# Patient Record
Sex: Male | Born: 1960 | Race: Asian | Hispanic: No | Marital: Married | Smoking: Former smoker
Health system: Southern US, Community
[De-identification: ages and names within clinical notes are randomized; demographics above are authoritative.]

## PROBLEM LIST (undated history)

## (undated) DIAGNOSIS — I219 Acute myocardial infarction, unspecified: Secondary | ICD-10-CM

## (undated) DIAGNOSIS — IMO0002 Reserved for concepts with insufficient information to code with codable children: Secondary | ICD-10-CM

## (undated) DIAGNOSIS — E785 Hyperlipidemia, unspecified: Secondary | ICD-10-CM

## (undated) DIAGNOSIS — E790 Hyperuricemia without signs of inflammatory arthritis and tophaceous disease: Secondary | ICD-10-CM

## (undated) DIAGNOSIS — K922 Gastrointestinal hemorrhage, unspecified: Secondary | ICD-10-CM

## (undated) DIAGNOSIS — I480 Paroxysmal atrial fibrillation: Secondary | ICD-10-CM

## (undated) DIAGNOSIS — D649 Anemia, unspecified: Secondary | ICD-10-CM

## (undated) DIAGNOSIS — M1990 Unspecified osteoarthritis, unspecified site: Secondary | ICD-10-CM

## (undated) DIAGNOSIS — N189 Chronic kidney disease, unspecified: Secondary | ICD-10-CM

## (undated) DIAGNOSIS — I10 Essential (primary) hypertension: Secondary | ICD-10-CM

## (undated) DIAGNOSIS — I639 Cerebral infarction, unspecified: Secondary | ICD-10-CM

## (undated) DIAGNOSIS — R011 Cardiac murmur, unspecified: Secondary | ICD-10-CM

## (undated) DIAGNOSIS — E119 Type 2 diabetes mellitus without complications: Secondary | ICD-10-CM

## (undated) DIAGNOSIS — K219 Gastro-esophageal reflux disease without esophagitis: Secondary | ICD-10-CM

## (undated) DIAGNOSIS — Z972 Presence of dental prosthetic device (complete) (partial): Secondary | ICD-10-CM

## (undated) DIAGNOSIS — I509 Heart failure, unspecified: Secondary | ICD-10-CM

## (undated) DIAGNOSIS — D689 Coagulation defect, unspecified: Secondary | ICD-10-CM

## (undated) DIAGNOSIS — F419 Anxiety disorder, unspecified: Secondary | ICD-10-CM

## (undated) DIAGNOSIS — G709 Myoneural disorder, unspecified: Secondary | ICD-10-CM

## (undated) HISTORY — DX: Gastro-esophageal reflux disease without esophagitis: K21.9

## (undated) HISTORY — DX: Chronic kidney disease, unspecified: N18.9

## (undated) HISTORY — DX: Heart failure, unspecified: I50.9

## (undated) HISTORY — DX: Reserved for concepts with insufficient information to code with codable children: IMO0002

## (undated) HISTORY — DX: Acute myocardial infarction, unspecified: I21.9

## (undated) HISTORY — DX: Cardiac murmur, unspecified: R01.1

## (undated) HISTORY — DX: Cerebral infarction, unspecified: I63.9

## (undated) HISTORY — DX: Myoneural disorder, unspecified: G70.9

## (undated) HISTORY — DX: Anxiety disorder, unspecified: F41.9

## (undated) HISTORY — DX: Coagulation defect, unspecified: D68.9

---

## 2019-12-21 LAB — LIPID PANEL
Cholesterol: 172 (ref 0–200)
HDL: 30 — AB (ref 35–70)
LDL Cholesterol: 73
Triglycerides: 433 — AB (ref 40–160)

## 2019-12-21 LAB — MICROALBUMIN, URINE: Microalb, Ur: 387

## 2019-12-21 LAB — PSA: PSA: 0.2

## 2020-02-20 DIAGNOSIS — I25810 Atherosclerosis of coronary artery bypass graft(s) without angina pectoris: Secondary | ICD-10-CM

## 2020-02-20 HISTORY — DX: Atherosclerosis of coronary artery bypass graft(s) without angina pectoris: I25.810

## 2020-02-22 ENCOUNTER — Encounter (HOSPITAL_BASED_OUTPATIENT_CLINIC_OR_DEPARTMENT_OTHER): Payer: Self-pay | Admitting: Emergency Medicine

## 2020-02-22 ENCOUNTER — Inpatient Hospital Stay (HOSPITAL_BASED_OUTPATIENT_CLINIC_OR_DEPARTMENT_OTHER)
Admission: EM | Admit: 2020-02-22 | Discharge: 2020-03-13 | DRG: 981 | Disposition: A | Discharge status: Home / Self Care | Payer: Managed Care, Other (non HMO) | Attending: Cardiothoracic Surgery | Admitting: Cardiothoracic Surgery

## 2020-02-22 ENCOUNTER — Other Ambulatory Visit: Payer: Self-pay

## 2020-02-22 ENCOUNTER — Inpatient Hospital Stay (HOSPITAL_COMMUNITY): Payer: Managed Care, Other (non HMO)

## 2020-02-22 DIAGNOSIS — I152 Hypertension secondary to endocrine disorders: Secondary | ICD-10-CM | POA: Diagnosis present

## 2020-02-22 DIAGNOSIS — I083 Combined rheumatic disorders of mitral, aortic and tricuspid valves: Secondary | ICD-10-CM | POA: Diagnosis present

## 2020-02-22 DIAGNOSIS — J810 Acute pulmonary edema: Secondary | ICD-10-CM | POA: Diagnosis not present

## 2020-02-22 DIAGNOSIS — Z6827 Body mass index (BMI) 27.0-27.9, adult: Secondary | ICD-10-CM

## 2020-02-22 DIAGNOSIS — E119 Type 2 diabetes mellitus without complications: Secondary | ICD-10-CM | POA: Diagnosis present

## 2020-02-22 DIAGNOSIS — I2510 Atherosclerotic heart disease of native coronary artery without angina pectoris: Secondary | ICD-10-CM | POA: Diagnosis present

## 2020-02-22 DIAGNOSIS — D696 Thrombocytopenia, unspecified: Secondary | ICD-10-CM | POA: Diagnosis present

## 2020-02-22 DIAGNOSIS — Z4659 Encounter for fitting and adjustment of other gastrointestinal appliance and device: Secondary | ICD-10-CM | POA: Diagnosis not present

## 2020-02-22 DIAGNOSIS — K859 Acute pancreatitis without necrosis or infection, unspecified: Secondary | ICD-10-CM | POA: Diagnosis present

## 2020-02-22 DIAGNOSIS — Z01810 Encounter for preprocedural cardiovascular examination: Secondary | ICD-10-CM | POA: Diagnosis not present

## 2020-02-22 DIAGNOSIS — Z20822 Contact with and (suspected) exposure to covid-19: Secondary | ICD-10-CM | POA: Diagnosis present

## 2020-02-22 DIAGNOSIS — R502 Drug induced fever: Secondary | ICD-10-CM | POA: Diagnosis not present

## 2020-02-22 DIAGNOSIS — I340 Nonrheumatic mitral (valve) insufficiency: Secondary | ICD-10-CM | POA: Diagnosis not present

## 2020-02-22 DIAGNOSIS — I469 Cardiac arrest, cause unspecified: Secondary | ICD-10-CM | POA: Diagnosis not present

## 2020-02-22 DIAGNOSIS — Z9889 Other specified postprocedural states: Secondary | ICD-10-CM

## 2020-02-22 DIAGNOSIS — E669 Obesity, unspecified: Secondary | ICD-10-CM | POA: Diagnosis present

## 2020-02-22 DIAGNOSIS — E1110 Type 2 diabetes mellitus with ketoacidosis without coma: Secondary | ICD-10-CM | POA: Diagnosis not present

## 2020-02-22 DIAGNOSIS — I442 Atrioventricular block, complete: Secondary | ICD-10-CM | POA: Diagnosis not present

## 2020-02-22 DIAGNOSIS — G928 Other toxic encephalopathy: Secondary | ICD-10-CM | POA: Diagnosis not present

## 2020-02-22 DIAGNOSIS — I480 Paroxysmal atrial fibrillation: Secondary | ICD-10-CM | POA: Diagnosis not present

## 2020-02-22 DIAGNOSIS — I462 Cardiac arrest due to underlying cardiac condition: Secondary | ICD-10-CM | POA: Diagnosis not present

## 2020-02-22 DIAGNOSIS — K7200 Acute and subacute hepatic failure without coma: Secondary | ICD-10-CM | POA: Diagnosis present

## 2020-02-22 DIAGNOSIS — R509 Fever, unspecified: Secondary | ICD-10-CM | POA: Diagnosis not present

## 2020-02-22 DIAGNOSIS — K922 Gastrointestinal hemorrhage, unspecified: Principal | ICD-10-CM | POA: Diagnosis present

## 2020-02-22 DIAGNOSIS — Z7982 Long term (current) use of aspirin: Secondary | ICD-10-CM

## 2020-02-22 DIAGNOSIS — Z951 Presence of aortocoronary bypass graft: Secondary | ICD-10-CM | POA: Diagnosis not present

## 2020-02-22 DIAGNOSIS — D62 Acute posthemorrhagic anemia: Secondary | ICD-10-CM | POA: Diagnosis present

## 2020-02-22 DIAGNOSIS — J9601 Acute respiratory failure with hypoxia: Secondary | ICD-10-CM | POA: Diagnosis not present

## 2020-02-22 DIAGNOSIS — K648 Other hemorrhoids: Secondary | ICD-10-CM | POA: Diagnosis present

## 2020-02-22 DIAGNOSIS — M109 Gout, unspecified: Secondary | ICD-10-CM | POA: Diagnosis present

## 2020-02-22 DIAGNOSIS — I214 Non-ST elevation (NSTEMI) myocardial infarction: Secondary | ICD-10-CM | POA: Diagnosis not present

## 2020-02-22 DIAGNOSIS — I493 Ventricular premature depolarization: Secondary | ICD-10-CM | POA: Diagnosis not present

## 2020-02-22 DIAGNOSIS — E785 Hyperlipidemia, unspecified: Secondary | ICD-10-CM | POA: Diagnosis not present

## 2020-02-22 DIAGNOSIS — E876 Hypokalemia: Secondary | ICD-10-CM | POA: Diagnosis not present

## 2020-02-22 DIAGNOSIS — N179 Acute kidney failure, unspecified: Secondary | ICD-10-CM | POA: Diagnosis present

## 2020-02-22 DIAGNOSIS — Z452 Encounter for adjustment and management of vascular access device: Secondary | ICD-10-CM

## 2020-02-22 DIAGNOSIS — I10 Essential (primary) hypertension: Secondary | ICD-10-CM | POA: Diagnosis not present

## 2020-02-22 DIAGNOSIS — R579 Shock, unspecified: Secondary | ICD-10-CM | POA: Diagnosis not present

## 2020-02-22 DIAGNOSIS — R569 Unspecified convulsions: Secondary | ICD-10-CM | POA: Diagnosis not present

## 2020-02-22 DIAGNOSIS — T39395A Adverse effect of other nonsteroidal anti-inflammatory drugs [NSAID], initial encounter: Secondary | ICD-10-CM | POA: Diagnosis present

## 2020-02-22 DIAGNOSIS — R570 Cardiogenic shock: Secondary | ICD-10-CM | POA: Diagnosis present

## 2020-02-22 DIAGNOSIS — K5730 Diverticulosis of large intestine without perforation or abscess without bleeding: Secondary | ICD-10-CM | POA: Diagnosis present

## 2020-02-22 DIAGNOSIS — R1110 Vomiting, unspecified: Secondary | ICD-10-CM

## 2020-02-22 DIAGNOSIS — I25110 Atherosclerotic heart disease of native coronary artery with unstable angina pectoris: Secondary | ICD-10-CM | POA: Diagnosis not present

## 2020-02-22 DIAGNOSIS — Z8679 Personal history of other diseases of the circulatory system: Secondary | ICD-10-CM | POA: Diagnosis not present

## 2020-02-22 DIAGNOSIS — I472 Ventricular tachycardia: Secondary | ICD-10-CM | POA: Diagnosis not present

## 2020-02-22 DIAGNOSIS — Z87891 Personal history of nicotine dependence: Secondary | ICD-10-CM

## 2020-02-22 DIAGNOSIS — E8770 Fluid overload, unspecified: Secondary | ICD-10-CM | POA: Diagnosis not present

## 2020-02-22 DIAGNOSIS — J9690 Respiratory failure, unspecified, unspecified whether with hypoxia or hypercapnia: Secondary | ICD-10-CM

## 2020-02-22 DIAGNOSIS — I252 Old myocardial infarction: Secondary | ICD-10-CM

## 2020-02-22 DIAGNOSIS — I361 Nonrheumatic tricuspid (valve) insufficiency: Secondary | ICD-10-CM | POA: Diagnosis not present

## 2020-02-22 HISTORY — DX: Essential (primary) hypertension: I10

## 2020-02-22 HISTORY — DX: Type 2 diabetes mellitus without complications: E11.9

## 2020-02-22 LAB — COMPREHENSIVE METABOLIC PANEL
ALT: 43 U/L (ref 0–44)
ALT: <5 U/L (ref 0–44)
AST: 29 U/L (ref 15–41)
AST: 29 U/L (ref 15–41)
Albumin: 4.1 g/dL (ref 3.5–5.0)
Albumin: 3.1 g/dL — ABNORMAL LOW (ref 3.5–5.0)
Alkaline Phosphatase: 69 U/L (ref 38–126)
Alkaline Phosphatase: 44 U/L (ref 38–126)
Anion gap: 15 (ref 5–15)
Anion gap: 16 — ABNORMAL HIGH (ref 5–15)
BUN: 18 mg/dL (ref 6–20)
BUN: 18 mg/dL (ref 6–20)
CO2: 21 mmol/L — ABNORMAL LOW (ref 22–32)
CO2: 15 mmol/L — ABNORMAL LOW (ref 22–32)
Calcium: 9 mg/dL (ref 8.9–10.3)
Calcium: 7.9 mg/dL — ABNORMAL LOW (ref 8.9–10.3)
Chloride: 98 mmol/L (ref 98–111)
Chloride: 104 mmol/L (ref 98–111)
Creatinine, Ser: 0.95 mg/dL (ref 0.61–1.24)
Creatinine, Ser: 1.1 mg/dL (ref 0.61–1.24)
GFR, Estimated: >60 mL/min (ref >60)
GFR, Estimated: >60 mL/min (ref >60)
Glucose, Bld: 417 mg/dL — ABNORMAL HIGH (ref 70–99)
Glucose, Bld: 457 mg/dL — ABNORMAL HIGH (ref 70–99)
Potassium: 4.2 mmol/L (ref 3.5–5.1)
Potassium: 4.7 mmol/L (ref 3.5–5.1)
Sodium: 134 mmol/L — ABNORMAL LOW (ref 135–145)
Sodium: 135 mmol/L (ref 135–145)
Total Bilirubin: 1.1 mg/dL (ref 0.3–1.2)
Total Bilirubin: 0.9 mg/dL (ref 0.3–1.2)
Total Protein: 8.3 g/dL — ABNORMAL HIGH (ref 6.5–8.1)
Total Protein: 6.1 g/dL — ABNORMAL LOW (ref 6.5–8.1)

## 2020-02-22 LAB — ABO/RH: ABO/RH(D): O POS

## 2020-02-22 LAB — CBC
HCT: 37.2 % — ABNORMAL LOW (ref 39.0–52.0)
HCT: 32.8 % — ABNORMAL LOW (ref 39.0–52.0)
Hemoglobin: 13.2 g/dL (ref 13.0–17.0)
Hemoglobin: 11.6 g/dL — ABNORMAL LOW (ref 13.0–17.0)
MCH: 32.4 pg (ref 26.0–34.0)
MCH: 32.4 pg (ref 26.0–34.0)
MCHC: 35.5 g/dL (ref 30.0–36.0)
MCHC: 35.4 g/dL (ref 30.0–36.0)
MCV: 91.2 fL (ref 80.0–100.0)
MCV: 91.6 fL (ref 80.0–100.0)
Platelets: 212 10*3/uL (ref 150–400)
Platelets: 203 10*3/uL (ref 150–400)
RBC: 4.08 MIL/uL — ABNORMAL LOW (ref 4.22–5.81)
RBC: 3.58 MIL/uL — ABNORMAL LOW (ref 4.22–5.81)
RDW: 12.7 % (ref 11.5–15.5)
RDW: 12.7 % (ref 11.5–15.5)
WBC: 15.1 10*3/uL — ABNORMAL HIGH (ref 4.0–10.5)
WBC: 20.4 10*3/uL — ABNORMAL HIGH (ref 4.0–10.5)
nRBC: 0 % (ref 0.0–0.2)
nRBC: 0 % (ref 0.0–0.2)

## 2020-02-22 LAB — GLUCOSE, CAPILLARY: Glucose-Capillary: 425 mg/dL — ABNORMAL HIGH (ref 70–99)

## 2020-02-22 LAB — OCCULT BLOOD X 1 CARD TO LAB, STOOL: Fecal Occult Bld: POSITIVE — AB

## 2020-02-22 LAB — HEMOGLOBIN AND HEMATOCRIT, BLOOD
HCT: 26.6 % — ABNORMAL LOW (ref 39.0–52.0)
Hemoglobin: 8.9 g/dL — ABNORMAL LOW (ref 13.0–17.0)

## 2020-02-22 LAB — RESP PANEL BY RT-PCR (FLU A&B, COVID) ARPGX2
Influenza A by PCR: NEGATIVE
Influenza B by PCR: NEGATIVE
SARS Coronavirus 2 by RT PCR: NEGATIVE

## 2020-02-22 LAB — PROTIME-INR
INR: 1.3 — ABNORMAL HIGH (ref 0.8–1.2)
Prothrombin Time: 16 seconds — ABNORMAL HIGH (ref 11.4–15.2)

## 2020-02-22 LAB — PREPARE RBC (CROSSMATCH)

## 2020-02-22 MED ORDER — INSULIN ASPART 100 UNIT/ML ~~LOC~~ SOLN
0.0000 [IU] | SUBCUTANEOUS | Status: DC
  Administered 2020-02-22: 9 [IU] via SUBCUTANEOUS

## 2020-02-22 MED ORDER — LACTATED RINGERS IV BOLUS
1000.0000 mL | Freq: Once | INTRAVENOUS | Status: AC
  Administered 2020-02-22: 1000 mL via INTRAVENOUS

## 2020-02-22 MED ORDER — ONDANSETRON HCL 4 MG PO TABS: 4.0000 mg | ORAL_TABLET | Freq: Four times a day (QID) | ORAL | Status: DC | PRN

## 2020-02-22 MED ORDER — SODIUM CHLORIDE 0.9 % IV BOLUS
1000.0000 mL | Freq: Once | INTRAVENOUS | Status: AC
  Administered 2020-02-22: 1000 mL via INTRAVENOUS

## 2020-02-22 MED ORDER — INSULIN ASPART 100 UNIT/ML ~~LOC~~ SOLN
10.0000 [IU] | Freq: Once | SUBCUTANEOUS | Status: AC
  Administered 2020-02-22: 10 [IU] via SUBCUTANEOUS

## 2020-02-22 MED ORDER — IOHEXOL 350 MG/ML SOLN
100.0000 mL | Freq: Once | INTRAVENOUS | Status: AC | PRN
  Administered 2020-02-22: 100 mL via INTRAVENOUS

## 2020-02-22 MED ORDER — ACETAMINOPHEN 650 MG RE SUPP: 650.0000 mg | Freq: Four times a day (QID) | RECTAL | Status: DC | PRN

## 2020-02-22 MED ORDER — SODIUM CHLORIDE 0.45 % IV BOLUS: 1000.0000 mL | Freq: Once | INTRAVENOUS | Status: DC

## 2020-02-22 MED ORDER — SODIUM CHLORIDE 0.9% IV SOLUTION: Freq: Once | INTRAVENOUS | Status: AC

## 2020-02-22 MED ORDER — ACETAMINOPHEN 325 MG PO TABS
650.0000 mg | ORAL_TABLET | Freq: Four times a day (QID) | ORAL | Status: DC | PRN
  Filled 2020-02-22: qty 2

## 2020-02-22 MED ORDER — LACTATED RINGERS IV SOLN: INTRAVENOUS | Status: DC

## 2020-02-22 MED ORDER — ONDANSETRON HCL 4 MG/2ML IJ SOLN: 4.0000 mg | Freq: Four times a day (QID) | INTRAMUSCULAR | Status: DC | PRN

## 2020-02-22 NOTE — ED Provider Notes (Signed)
[facility] EMER[facility]ENCY DEPARTMENT Provider Note  CSN: [id number] Arrival date & time: 02/22/20 1105    History Chief Complaint  Patient presents with  . Rectal Bleeding    HPI  Colton Guzman is a 60 y.o. male with history of HTN, DM does not take any blood thinners was in his normal state of health the last few days but woke up from sleep around 3am to have a BM and noted it was all blood, described as cherry in color, no maroon or melanic stools. Not associated with N/V or abdominal pain. He has had 7 total bloody bowel movements since then. He has been feeling weak/tired but denies SOB or lightheadedness. He has no history of same.    Past Medical History:  Diagnosis Date  . Diabetes mellitus without complication ([place])   . Hypertension       No family history on file.  Social History   Tobacco Use  . Smoking status: Never Smoker  . Smokeless tobacco: Never Used  Substance Use Topics  . Alcohol use: Yes     Home Medications Prior to Admission medications   Not on File     Allergies    Patient has no known allergies.   Review of Systems   Review of Systems A comprehensive review of systems was completed and negative except as noted in HPI.    Physical Exam BP (!) 131/92 (BP Location: Right Arm)   Pulse (!) 114   Temp 99.2 F (37.3 C) (Oral)   Resp 18   Wt 88.2 kg   SpO2 97%   Physical Exam Vitals and nursing note reviewed.  Constitutional:      Appearance: Normal appearance.  HENT:     Head: Normocephalic and atraumatic.     Nose: Nose normal.     Mouth/Throat:     Mouth: Mucous membranes are moist.  Eyes:     Extraocular Movements: Extraocular movements intact.     Conjunctiva/sclera: Conjunctivae normal.  Cardiovascular:     Rate and Rhythm: Tachycardia present.  Pulmonary:     Effort: Pulmonary effort is normal.     Breath sounds: Normal breath sounds.  Abdominal:     General: Abdomen is flat.     Palpations: Abdomen is  soft.     Tenderness: There is no abdominal tenderness.  Genitourinary:    Comments: Chaperone present. Several small hemorrhoids, not bleeding. No tenderness or mass on rectal exam. Bright red blood on fingertip. No stool in rectum.  Musculoskeletal:        General: No swelling. Normal range of motion.     Cervical back: Neck supple.  Skin:    General: Skin is warm and dry.  Neurological:     General: No focal deficit present.     Mental Status: He is alert.  Psychiatric:        Mood and Affect: Mood normal.      ED Results / Procedures / Treatments   Labs (all labs ordered are listed, but only abnormal results are displayed) Labs Reviewed  COMPREHENSIVE METABOLIC PANEL  CBC  POC OCCULT BLOOD, ED    EKG EKG Interpretation  Date/Time:  Monday February 22 2020 11:49:08 EST Ventricular Rate:  114 PR Interval:    QRS Duration: 79 QT Interval:  318 QTC Calculation: 438 R Axis:   14 Text Interpretation: Sinus tachycardia Probable left atrial enlargement Low voltage, precordial leads Anteroseptal infarct, old No old tracing to compare Confirmed by Karle Starch,  Juanda Crumble [phone]) on 02/22/2020 11:50:33 AM    Radiology No results found.  Procedures Procedures  Medications Ordered in the ED Medications - No data to display   MDM Rules/Calculators/A&P MDM Patient with lower GI bleeding, unclear etiology. Could be diverticular. He is tachycardic but not hypotensive. HR improved with resting in bed. Will check labs, including CBC, BMP and Hemoccult. IVF for tachycardia.  ED Course  I have reviewed the triage vital signs and the nursing notes.  Pertinent labs & imaging results that were available during my care of the patient were reviewed by me and considered in my medical decision making (see chart for details).  Clinical Course as of 02/23/20 0708  Mon Feb 22, 2020  1303 CMP with elevated glucose, no signs of DKA or significant acidosis. CBC with mildly elevated WBC, Hgb is  normal but unsure of baseline. No prior records available, his PCP is at [facility].  [CS]  [place] with Dr. Alessandra Bevels, [facility], who would like for the patient to be admitted, will consider colonoscopy if continues to bleed. HE was also able to look in [place] but has not had a CBC checked with them before either.   He has had additional bloody diarrhea in the ED. Hospitalist paged. [CS]  1445 Spoke with Dr. Nada Libman, Hospitalist, who will admit. Requests repeat CBC at 1600hrs.  [CS]    Clinical Course User Index [CS] Truddie Hidden, MD    Final Clinical Impression(s) / ED Diagnoses Final diagnoses:  Lower GI bleed    Rx / DC Orders ED Discharge Orders    None       Truddie Hidden, MD 02/23/20 [phone]

## 2020-02-22 NOTE — ED Triage Notes (Addendum)
Rectal bleeding since this am.  No history of same.  Feeling weak.  Pt having diarrhea.

## 2020-02-22 NOTE — H&P (Signed)
History and Physical   Colton Guzman QJJ:[id number] DOB: Jul 12, 1960 DOA: 02/22/2020  Referring MD/NP/PA: Dr. Karle Starch, [place] PCP: Patient unable to recall PCP's name after 1 visit Patient coming from: Home by way of [facility]  Chief Complaint: BRBPR  HPI: Colton Guzman is a 60 y.o. male with a history of HTN and T2DM on daily aspirin 81mg  who presented to the ED 02/22/2020 with painless hematochezia at ~3:45am that morning. He went to sleep in his usual state of health and awoke to spontaneous bleeding, had 3 more episodes of blood that appeared cherry color at first, now frank red with no admixed stool. This is constant, progressing, and becoming associated with lightheadedness and decreased exertional tolerance.   In the ED, +FOBT with some ongoing bleeding HR peaking in 120's but normotensive, afebrile, abd exam reportedly benign. Hgb was noted to be 13.2 g/dl (unknown baseline). 1L NS given, hgb down to 11.6g/dl and patient admitted to [facility] with GI consulted.  Never had screening colonoscopy, no GI doctor in [place]. Does not take other NSAIDs regularly, does not drink heavily, has no history of GI bleeding prior to this event, is not Jehovah's witness. Has had 2 doses of covid vaccine.  Review of Systems: Denies fever, chills, weight loss, changes in vision or hearing, headache, cough, sore throat, chest pain, palpitations, shortness of breath, abdominal pain, nausea, vomiting, change in bladder habits, myalgias, arthralgias, rash, and per HPI. All others reviewed and are negative.   PMH: HTN, DM, HLD, gout PSH: No pertinent surgeries reported Meds: Norvasc, losartan, amlodipine; glimepiride, metformin; atorvastatin; aspirin 81mg , allopurinol. Last taken 02/21/2020. NKDA, no known contrast allergy SH: Lives with wife, formerly lived in [place] long term. Drinks alcohol occasionally (<1 per week), but did have 2 glasses of wine and some beer spread out between Christmas and NYE. Quit smoking 10 years ago. No  illicit drugs.  FH: Reviewed and not pertinent.  Physical Exam: BP 114/87 (BP Location: Right Arm)   Pulse (!) 118   Temp 98.3 F (36.8 C) (Oral)   Resp 19   Wt 88.2 kg   SpO2 100%   Constitutional: 60 y.o. male in no distress, calm demeanor Eyes: Lids and conjunctivae normal, PERRL ENMT: Mucous membranes are moist. Posterior pharynx clear of any exudate or lesions. Normal dentition.  Neck: normal, supple, no masses, no thyromegaly Respiratory: Non-labored breathing room air, 100%, without accessory muscle use. Clear breath sounds to auscultation bilaterally Cardiovascular: Regular tachycardia hovering ~110, no murmurs, rubs, or gallops. No carotid bruits. No JVD. No LE edema. Palpable pedal pulses. Abdomen: Normoactive bowel sounds. No tenderness, non-distended, and no masses palpated. No hepatosplenomegaly. Per EDP note several small nonbleeding hemorrhoids and no tenderness or mass on rectal exam. GU: No indwelling catheter Musculoskeletal: No clubbing / cyanosis. No joint deformity upper and lower extremities. Good ROM, no contractures. Normal muscle tone.  Skin: Warm, dry, diffuse pallor. No rashes, wounds, or ulcers on visualized skin. Neurologic: CN II-XII grossly intact. Gait wnl. Speech normal. No focal deficits in motor strength or sensation in all extremities.  Psychiatric: Alert and oriented x3. Normal judgment and insight. Mood euthymic with congruent affect.   Labs on Admission: I have personally reviewed following labs and imaging studies  CBC: Recent Labs  Lab 02/22/20 1215 02/22/20 1703  WBC 15.1* 20.4*  HGB 13.2 11.6*  HCT 37.2* 32.8*  MCV 91.2 91.6  PLT 212 481   Basic Metabolic Panel: Recent Labs  Lab 02/22/20 1215  NA 134*  K 4.2  CL 98  CO2 21*  GLUCOSE 417*  BUN 18  CREATININE 0.95  CALCIUM 9.0   Liver Function Tests: Recent Labs  Lab 02/22/20 1215  AST 29  ALT 43  ALKPHOS 69  BILITOT 1.1  PROT 8.3*  ALBUMIN 4.1   Covid, flu A and B:  Negative 02/22/2020 2:49pm  EKG: Independently reviewed. Sinus tachycardia with normal axis, noncontiguous and nonspecific T wave changes with ST segment deviation.  Assessment/Plan Active Problems:   GI bleed due to NSAIDs   Acute blood loss anemia due to acute lower GI bleeding: Appears typical of diverticular bleed. No known hx diverticulosis.  - Check CT angio abd/pelvis since he appears to still be bleeding. This is ordered stat and if positive would prompt stat IR consult to consider embolization. - Serial H/H, check PT-INR, T&S. Pt would consent to blood products and as discussed with GI, Dr. Therisa Doyne, threshold for transfusion with active bleeding and symptomatic anemia and tachycardia will be 9g/dl or less.  - Since tachycardia continues, will re-bolus IVF and continue LR gtt. Pt is not in distress and normotensive. Will keep on floor for now.  - GI consulted, I confirmed Dr. Therisa Doyne aware of patient's arrival to [facility]. Will keep pt NPO p MN.  - Continue IVF.  - Of course hold ASA and pharmacologic VTE ppx.  T2DM with hyperglycemia:  - Start CBG, SSI q4h w/NPO status.  - Check HbA1c.  - Hold metformin with angiography  HTN:  - Hold antihypertensives.  HLD: Reported taking atorvastatin. Will await formal med rec.   DVT prophylaxis: SCDs  Code Status: Full  Family Communication: None at bedside, pt calling his wife at the end of encounter, did not request I call Disposition Plan: Anticipate return home depending on clinical trajectory Consults called: GI, Dr. Therisa Doyne.  Admission status: Inpatient    Patrecia Pour, MD [facility] [email or web address] 02/22/2020, 6:12 PM

## 2020-02-22 NOTE — Progress Notes (Signed)
VS taken by tech, HR-132, making him a red mews. HR was rechecked a couple minutes later and HR was 126, patient going back to a yellow mews.

## 2020-02-22 NOTE — Progress Notes (Signed)
NP Ouma paged about patient's BG being 425.

## 2020-02-23 ENCOUNTER — Inpatient Hospital Stay (HOSPITAL_COMMUNITY): Payer: Managed Care, Other (non HMO)

## 2020-02-23 ENCOUNTER — Inpatient Hospital Stay (HOSPITAL_COMMUNITY): Payer: Managed Care, Other (non HMO) | Admitting: Certified Registered Nurse Anesthetist

## 2020-02-23 DIAGNOSIS — K922 Gastrointestinal hemorrhage, unspecified: Principal | ICD-10-CM

## 2020-02-23 DIAGNOSIS — D62 Acute posthemorrhagic anemia: Secondary | ICD-10-CM | POA: Diagnosis not present

## 2020-02-23 DIAGNOSIS — E1110 Type 2 diabetes mellitus with ketoacidosis without coma: Secondary | ICD-10-CM

## 2020-02-23 DIAGNOSIS — I472 Ventricular tachycardia: Secondary | ICD-10-CM

## 2020-02-23 DIAGNOSIS — I10 Essential (primary) hypertension: Secondary | ICD-10-CM | POA: Diagnosis not present

## 2020-02-23 DIAGNOSIS — E785 Hyperlipidemia, unspecified: Secondary | ICD-10-CM | POA: Diagnosis not present

## 2020-02-23 LAB — PROTIME-INR
INR: 1.5 — ABNORMAL HIGH (ref 0.8–1.2)
Prothrombin Time: 17.6 seconds — ABNORMAL HIGH (ref 11.4–15.2)

## 2020-02-23 LAB — CBC
HCT: 27.7 % — ABNORMAL LOW (ref 39.0–52.0)
HCT: 28.1 % — ABNORMAL LOW (ref 39.0–52.0)
Hemoglobin: 9.4 g/dL — ABNORMAL LOW (ref 13.0–17.0)
Hemoglobin: 9.6 g/dL — ABNORMAL LOW (ref 13.0–17.0)
MCH: 31.9 pg (ref 26.0–34.0)
MCH: 30.3 pg (ref 26.0–34.0)
MCHC: 33.9 g/dL (ref 30.0–36.0)
MCHC: 34.2 g/dL (ref 30.0–36.0)
MCV: 93.9 fL (ref 80.0–100.0)
MCV: 88.6 fL (ref 80.0–100.0)
Platelets: 125 10*3/uL — ABNORMAL LOW (ref 150–400)
Platelets: 87 10*3/uL — ABNORMAL LOW (ref 150–400)
RBC: 2.95 MIL/uL — ABNORMAL LOW (ref 4.22–5.81)
RBC: 3.17 MIL/uL — ABNORMAL LOW (ref 4.22–5.81)
RDW: 14.9 % (ref 11.5–15.5)
RDW: 15.9 % — ABNORMAL HIGH (ref 11.5–15.5)
WBC: 18.8 10*3/uL — ABNORMAL HIGH (ref 4.0–10.5)
WBC: 16.3 10*3/uL — ABNORMAL HIGH (ref 4.0–10.5)
nRBC: 0.1 % (ref 0.0–0.2)
nRBC: 0.1 % (ref 0.0–0.2)

## 2020-02-23 LAB — TROPONIN I (HIGH SENSITIVITY)
Troponin I (High Sensitivity): 11 ng/L (ref <18)
Troponin I (High Sensitivity): 10 ng/L (ref <18)
Troponin I (High Sensitivity): 178 ng/L (ref <18)
Troponin I (High Sensitivity): 23 ng/L — ABNORMAL HIGH (ref <18)

## 2020-02-23 LAB — URINALYSIS, MICROSCOPIC (REFLEX): RBC / HPF: NONE SEEN RBC/hpf (ref 0–5)

## 2020-02-23 LAB — CBC WITH DIFFERENTIAL/PLATELET
Abs Immature Granulocytes: 0.41 10*3/uL — ABNORMAL HIGH (ref 0.00–0.07)
Abs Immature Granulocytes: 0.34 10*3/uL — ABNORMAL HIGH (ref 0.00–0.07)
Basophils Absolute: 0 10*3/uL (ref 0.0–0.1)
Basophils Absolute: 0 10*3/uL (ref 0.0–0.1)
Basophils Relative: 0 %
Basophils Relative: 0 %
Eosinophils Absolute: 0 10*3/uL (ref 0.0–0.5)
Eosinophils Absolute: 0 10*3/uL (ref 0.0–0.5)
Eosinophils Relative: 0 %
Eosinophils Relative: 0 %
HCT: 30.6 % — ABNORMAL LOW (ref 39.0–52.0)
HCT: 20.6 % — ABNORMAL LOW (ref 39.0–52.0)
Hemoglobin: 9.9 g/dL — ABNORMAL LOW (ref 13.0–17.0)
Hemoglobin: 7.2 g/dL — ABNORMAL LOW (ref 13.0–17.0)
Immature Granulocytes: 2 %
Immature Granulocytes: 2 %
Lymphocytes Relative: 17 %
Lymphocytes Relative: 42 %
Lymphs Abs: 3.4 10*3/uL (ref 0.7–4.0)
Lymphs Abs: 8.5 10*3/uL — ABNORMAL HIGH (ref 0.7–4.0)
MCH: 31.3 pg (ref 26.0–34.0)
MCH: 32.1 pg (ref 26.0–34.0)
MCHC: 32.4 g/dL (ref 30.0–36.0)
MCHC: 35 g/dL (ref 30.0–36.0)
MCV: 96.8 fL (ref 80.0–100.0)
MCV: 92 fL (ref 80.0–100.0)
Monocytes Absolute: 1 10*3/uL (ref 0.1–1.0)
Monocytes Absolute: 1.2 10*3/uL — ABNORMAL HIGH (ref 0.1–1.0)
Monocytes Relative: 5 %
Monocytes Relative: 6 %
Neutro Abs: 14.9 10*3/uL — ABNORMAL HIGH (ref 1.7–7.7)
Neutro Abs: 10 10*3/uL — ABNORMAL HIGH (ref 1.7–7.7)
Neutrophils Relative %: 76 %
Neutrophils Relative %: 50 %
Platelets: 191 10*3/uL (ref 150–400)
Platelets: 96 10*3/uL — ABNORMAL LOW (ref 150–400)
RBC: 3.16 MIL/uL — ABNORMAL LOW (ref 4.22–5.81)
RBC: 2.24 MIL/uL — ABNORMAL LOW (ref 4.22–5.81)
RDW: 14.5 % (ref 11.5–15.5)
RDW: 14.6 % (ref 11.5–15.5)
WBC: 19.7 10*3/uL — ABNORMAL HIGH (ref 4.0–10.5)
WBC: 20.1 10*3/uL — ABNORMAL HIGH (ref 4.0–10.5)
nRBC: 0 % (ref 0.0–0.2)
nRBC: 0.2 % (ref 0.0–0.2)

## 2020-02-23 LAB — COMPREHENSIVE METABOLIC PANEL
ALT: 37 U/L (ref 0–44)
AST: 41 U/L (ref 15–41)
Albumin: 2.5 g/dL — ABNORMAL LOW (ref 3.5–5.0)
Alkaline Phosphatase: 30 U/L — ABNORMAL LOW (ref 38–126)
Anion gap: 12 (ref 5–15)
BUN: 18 mg/dL (ref 6–20)
CO2: 19 mmol/L — ABNORMAL LOW (ref 22–32)
Calcium: 7.3 mg/dL — ABNORMAL LOW (ref 8.9–10.3)
Chloride: 107 mmol/L (ref 98–111)
Creatinine, Ser: 0.83 mg/dL (ref 0.61–1.24)
GFR, Estimated: >60 mL/min (ref >60)
Glucose, Bld: 272 mg/dL — ABNORMAL HIGH (ref 70–99)
Potassium: 3 mmol/L — ABNORMAL LOW (ref 3.5–5.1)
Sodium: 138 mmol/L (ref 135–145)
Total Bilirubin: 0.5 mg/dL (ref 0.3–1.2)
Total Protein: 4.4 g/dL — ABNORMAL LOW (ref 6.5–8.1)

## 2020-02-23 LAB — BASIC METABOLIC PANEL
Anion gap: 25 — ABNORMAL HIGH (ref 5–15)
Anion gap: 11 (ref 5–15)
Anion gap: 11 (ref 5–15)
BUN: 22 mg/dL — ABNORMAL HIGH (ref 6–20)
BUN: 21 mg/dL — ABNORMAL HIGH (ref 6–20)
BUN: 16 mg/dL (ref 6–20)
CO2: 11 mmol/L — ABNORMAL LOW (ref 22–32)
CO2: 22 mmol/L (ref 22–32)
CO2: 19 mmol/L — ABNORMAL LOW (ref 22–32)
Calcium: 8.4 mg/dL — ABNORMAL LOW (ref 8.9–10.3)
Calcium: 8.2 mg/dL — ABNORMAL LOW (ref 8.9–10.3)
Calcium: 7.3 mg/dL — ABNORMAL LOW (ref 8.9–10.3)
Chloride: 103 mmol/L (ref 98–111)
Chloride: 104 mmol/L (ref 98–111)
Chloride: 106 mmol/L (ref 98–111)
Creatinine, Ser: 1.5 mg/dL — ABNORMAL HIGH (ref 0.61–1.24)
Creatinine, Ser: 1.02 mg/dL (ref 0.61–1.24)
Creatinine, Ser: 0.77 mg/dL (ref 0.61–1.24)
GFR, Estimated: 54 mL/min — ABNORMAL LOW (ref >60)
GFR, Estimated: >60 mL/min (ref >60)
GFR, Estimated: >60 mL/min (ref >60)
Glucose, Bld: 551 mg/dL (ref 70–99)
Glucose, Bld: 269 mg/dL — ABNORMAL HIGH (ref 70–99)
Glucose, Bld: 291 mg/dL — ABNORMAL HIGH (ref 70–99)
Potassium: 4.8 mmol/L (ref 3.5–5.1)
Potassium: 3.9 mmol/L (ref 3.5–5.1)
Potassium: 3 mmol/L — ABNORMAL LOW (ref 3.5–5.1)
Sodium: 139 mmol/L (ref 135–145)
Sodium: 137 mmol/L (ref 135–145)
Sodium: 136 mmol/L (ref 135–145)

## 2020-02-23 LAB — MAGNESIUM
Magnesium: 2.1 mg/dL (ref 1.7–2.4)
Magnesium: 1.8 mg/dL (ref 1.7–2.4)

## 2020-02-23 LAB — GLUCOSE, CAPILLARY
Glucose-Capillary: 510 mg/dL (ref 70–99)
Glucose-Capillary: 481 mg/dL — ABNORMAL HIGH (ref 70–99)
Glucose-Capillary: 450 mg/dL — ABNORMAL HIGH (ref 70–99)
Glucose-Capillary: 444 mg/dL — ABNORMAL HIGH (ref 70–99)
Glucose-Capillary: 346 mg/dL — ABNORMAL HIGH (ref 70–99)
Glucose-Capillary: 274 mg/dL — ABNORMAL HIGH (ref 70–99)
Glucose-Capillary: 255 mg/dL — ABNORMAL HIGH (ref 70–99)
Glucose-Capillary: 218 mg/dL — ABNORMAL HIGH (ref 70–99)
Glucose-Capillary: 220 mg/dL — ABNORMAL HIGH (ref 70–99)
Glucose-Capillary: 199 mg/dL — ABNORMAL HIGH (ref 70–99)
Glucose-Capillary: 202 mg/dL — ABNORMAL HIGH (ref 70–99)
Glucose-Capillary: 187 mg/dL — ABNORMAL HIGH (ref 70–99)
Glucose-Capillary: 194 mg/dL — ABNORMAL HIGH (ref 70–99)
Glucose-Capillary: 167 mg/dL — ABNORMAL HIGH (ref 70–99)
Glucose-Capillary: 159 mg/dL — ABNORMAL HIGH (ref 70–99)
Glucose-Capillary: 244 mg/dL — ABNORMAL HIGH (ref 70–99)
Glucose-Capillary: 264 mg/dL — ABNORMAL HIGH (ref 70–99)
Glucose-Capillary: 224 mg/dL — ABNORMAL HIGH (ref 70–99)
Glucose-Capillary: 198 mg/dL — ABNORMAL HIGH (ref 70–99)

## 2020-02-23 LAB — BLOOD GAS, VENOUS
Acid-base deficit: 15.7 mmol/L — ABNORMAL HIGH (ref 0.0–2.0)
Bicarbonate: 12 mmol/L — ABNORMAL LOW (ref 20.0–28.0)
O2 Saturation: 13.6 %
Patient temperature: 98.6
pCO2, Ven: 36.1 mmHg — ABNORMAL LOW (ref 44.0–60.0)
pH, Ven: 7.148 — CL (ref 7.250–7.430)
pO2, Ven: 18.2 mmHg — CL (ref 32.0–45.0)

## 2020-02-23 LAB — OSMOLALITY, URINE: Osmolality, Ur: 593 mOsm/kg (ref 300–900)

## 2020-02-23 LAB — BPAM PLATELET PHERESIS
Blood Product Expiration Date: 202201062359
Unit Type and Rh: 5100

## 2020-02-23 LAB — PREPARE PLATELET PHERESIS: Unit division: 0

## 2020-02-23 LAB — APTT: aPTT: 34 seconds (ref 24–36)

## 2020-02-23 LAB — BLOOD GAS, ARTERIAL
Acid-base deficit: 4.2 mmol/L — ABNORMAL HIGH (ref 0.0–2.0)
Bicarbonate: 19.8 mmol/L — ABNORMAL LOW (ref 20.0–28.0)
FIO2: 100
O2 Saturation: 99.8 %
Patient temperature: 98.2
pCO2 arterial: 34 mmHg (ref 32.0–48.0)
pH, Arterial: 7.383 (ref 7.350–7.450)
pO2, Arterial: 238 mmHg — ABNORMAL HIGH (ref 83.0–108.0)

## 2020-02-23 LAB — TSH
TSH: 1.03 (ref <=5.90)
TSH: 1.033 u[IU]/mL (ref 0.350–4.500)

## 2020-02-23 LAB — BETA-HYDROXYBUTYRIC ACID: Beta-Hydroxybutyric Acid: 2.02 mmol/L — ABNORMAL HIGH (ref 0.05–0.27)

## 2020-02-23 LAB — URINALYSIS, ROUTINE W REFLEX MICROSCOPIC
Bilirubin Urine: NEGATIVE
Glucose, UA: >=500 mg/dL — AB
Hgb urine dipstick: NEGATIVE
Ketones, ur: 40 mg/dL — AB
Leukocytes,Ua: NEGATIVE
Nitrite: NEGATIVE
Protein, ur: NEGATIVE mg/dL
Specific Gravity, Urine: 1.015 (ref 1.005–1.030)
pH: 5.5 (ref 5.0–8.0)

## 2020-02-23 LAB — HEMOGLOBIN AND HEMATOCRIT, BLOOD
HCT: 31.1 % — ABNORMAL LOW (ref 39.0–52.0)
HCT: 24.3 % — ABNORMAL LOW (ref 39.0–52.0)
HCT: 23.1 % — ABNORMAL LOW (ref 39.0–52.0)
Hemoglobin: 9.8 g/dL — ABNORMAL LOW (ref 13.0–17.0)
Hemoglobin: 8.4 g/dL — ABNORMAL LOW (ref 13.0–17.0)
Hemoglobin: 7.4 g/dL — ABNORMAL LOW (ref 13.0–17.0)

## 2020-02-23 LAB — HEMOGLOBIN A1C
Hgb A1c MFr Bld: 10.3 % — ABNORMAL HIGH (ref 4.8–5.6)
Mean Plasma Glucose: 248.91 mg/dL

## 2020-02-23 LAB — MRSA PCR SCREENING: MRSA by PCR: NEGATIVE

## 2020-02-23 LAB — LACTIC ACID, PLASMA
Lactic Acid, Venous: >11 mmol/L (ref 0.5–1.9)
Lactic Acid, Venous: 5 mmol/L (ref 0.5–1.9)
Lactic Acid, Venous: 6.4 mmol/L (ref 0.5–1.9)

## 2020-02-23 LAB — AMMONIA: Ammonia: 29 umol/L (ref 9–35)

## 2020-02-23 LAB — OSMOLALITY: Osmolality: 336 mOsm/kg (ref 275–295)

## 2020-02-23 LAB — LIPASE, BLOOD: Lipase: 53 U/L — ABNORMAL HIGH (ref 11–51)

## 2020-02-23 LAB — PREPARE RBC (CROSSMATCH)

## 2020-02-23 LAB — PROCALCITONIN: Procalcitonin: 0.29 ng/mL

## 2020-02-23 LAB — HIV ANTIBODY (ROUTINE TESTING W REFLEX): HIV Screen 4th Generation wRfx: NONREACTIVE

## 2020-02-23 MED ORDER — LACTATED RINGERS IV BOLUS
1000.0000 mL | Freq: Once | INTRAVENOUS | Status: AC
  Administered 2020-02-23: 1000 mL via INTRAVENOUS

## 2020-02-23 MED ORDER — INSULIN ASPART 100 UNIT/ML ~~LOC~~ SOLN: 0.0000 [IU] | SUBCUTANEOUS | Status: DC

## 2020-02-23 MED ORDER — ALBUMIN HUMAN 25 % IV SOLN: 25.0000 g | Freq: Once | INTRAVENOUS | Status: DC

## 2020-02-23 MED ORDER — MIDAZOLAM HCL 2 MG/2ML IJ SOLN
INTRAMUSCULAR | Status: AC
  Administered 2020-02-23: 2 mg
  Filled 2020-02-23: qty 2

## 2020-02-23 MED ORDER — ORAL CARE MOUTH RINSE
15.0000 mL | OROMUCOSAL | Status: DC
  Administered 2020-02-24 – 2020-03-01 (×62): 15 mL via OROMUCOSAL

## 2020-02-23 MED ORDER — LACTATED RINGERS IV SOLN: INTRAVENOUS | Status: DC

## 2020-02-23 MED ORDER — ORAL CARE MOUTH RINSE
15.0000 mL | Freq: Two times a day (BID) | OROMUCOSAL | Status: DC
  Administered 2020-02-23: 15 mL via OROMUCOSAL

## 2020-02-23 MED ORDER — MIDAZOLAM HCL 2 MG/2ML IJ SOLN: 2.0000 mg | INTRAMUSCULAR | Status: DC | PRN

## 2020-02-23 MED ORDER — MAGNESIUM SULFATE 2 GM/50ML IV SOLN
2.0000 g | Freq: Once | INTRAVENOUS | Status: AC
  Administered 2020-02-23: 2 g via INTRAVENOUS
  Filled 2020-02-23: qty 50

## 2020-02-23 MED ORDER — PIPERACILLIN-TAZOBACTAM 3.375 G IVPB
3.3750 g | Freq: Three times a day (TID) | INTRAVENOUS | Status: DC
  Administered 2020-02-24 – 2020-02-25 (×5): 3.375 g via INTRAVENOUS
  Filled 2020-02-23 (×5): qty 50

## 2020-02-23 MED ORDER — INSULIN ASPART 100 UNIT/ML ~~LOC~~ SOLN: 0.0000 [IU] | Freq: Every day | SUBCUTANEOUS | Status: DC

## 2020-02-23 MED ORDER — ALUM & MAG HYDROXIDE-SIMETH 200-200-20 MG/5ML PO SUSP: 30.0000 mL | ORAL | Status: DC | PRN

## 2020-02-23 MED ORDER — LACTATED RINGERS IV BOLUS
20.0000 mL/kg | Freq: Once | INTRAVENOUS | Status: AC
  Administered 2020-02-23: 1764 mL via INTRAVENOUS

## 2020-02-23 MED ORDER — POLYETHYLENE GLYCOL 3350 17 G PO PACK
17.0000 g | PACK | Freq: Every day | ORAL | Status: DC
  Administered 2020-02-25 – 2020-03-07 (×6): 17 g
  Filled 2020-02-23 (×8): qty 1

## 2020-02-23 MED ORDER — NITROGLYCERIN 0.4 MG SL SUBL
0.4000 mg | SUBLINGUAL_TABLET | SUBLINGUAL | Status: DC | PRN
  Administered 2020-02-23: 0.4 mg via SUBLINGUAL

## 2020-02-23 MED ORDER — FENTANYL CITRATE (PF) 100 MCG/2ML IJ SOLN
50.0000 ug | Freq: Once | INTRAMUSCULAR | Status: AC
  Administered 2020-02-23: 50 ug via INTRAVENOUS

## 2020-02-23 MED ORDER — DOCUSATE SODIUM 50 MG/5ML PO LIQD
100.0000 mg | Freq: Two times a day (BID) | ORAL | Status: DC
  Administered 2020-02-24 – 2020-03-07 (×10): 100 mg
  Filled 2020-02-23 (×20): qty 10

## 2020-02-23 MED ORDER — CHLORHEXIDINE GLUCONATE CLOTH 2 % EX PADS
6.0000 | MEDICATED_PAD | Freq: Every day | CUTANEOUS | Status: DC
  Administered 2020-02-23 – 2020-03-07 (×13): 6 via TOPICAL

## 2020-02-23 MED ORDER — INSULIN ASPART 100 UNIT/ML ~~LOC~~ SOLN
20.0000 [IU] | Freq: Once | SUBCUTANEOUS | Status: AC
  Administered 2020-02-23: 20 [IU] via SUBCUTANEOUS

## 2020-02-23 MED ORDER — ETOMIDATE 2 MG/ML IV SOLN
INTRAVENOUS | Status: DC | PRN
  Administered 2020-02-23: 20 mg via INTRAVENOUS

## 2020-02-23 MED ORDER — AMIODARONE HCL IN DEXTROSE 360-4.14 MG/200ML-% IV SOLN
60.0000 mg/h | INTRAVENOUS | Status: AC
  Administered 2020-02-23: 60 mg/h via INTRAVENOUS
  Filled 2020-02-23 (×2): qty 200

## 2020-02-23 MED ORDER — AMIODARONE IV BOLUS ONLY 150 MG/100ML
150.0000 mg | Freq: Once | INTRAVENOUS | Status: AC
  Administered 2020-02-23: 150 mg via INTRAVENOUS

## 2020-02-23 MED ORDER — POTASSIUM CHLORIDE 10 MEQ/50ML IV SOLN
10.0000 meq | INTRAVENOUS | Status: AC
  Administered 2020-02-23 – 2020-02-24 (×4): 10 meq via INTRAVENOUS
  Filled 2020-02-23 (×4): qty 50

## 2020-02-23 MED ORDER — PANTOPRAZOLE SODIUM 40 MG IV SOLR
40.0000 mg | Freq: Two times a day (BID) | INTRAVENOUS | Status: DC
  Administered 2020-02-23 – 2020-03-02 (×17): 40 mg via INTRAVENOUS
  Filled 2020-02-23 (×17): qty 40

## 2020-02-23 MED ORDER — SODIUM CHLORIDE 0.9% IV SOLUTION: Freq: Once | INTRAVENOUS | Status: DC

## 2020-02-23 MED ORDER — INSULIN REGULAR(HUMAN) IN NACL 100-0.9 UT/100ML-% IV SOLN
INTRAVENOUS | Status: DC
  Administered 2020-02-23: 15 [IU]/h via INTRAVENOUS
  Filled 2020-02-23 (×3): qty 100

## 2020-02-23 MED ORDER — INSULIN ASPART 100 UNIT/ML ~~LOC~~ SOLN
8.0000 [IU] | Freq: Once | SUBCUTANEOUS | Status: AC
  Administered 2020-02-23: 8 [IU] via INTRAVENOUS

## 2020-02-23 MED ORDER — AMIODARONE IV BOLUS ONLY 150 MG/100ML
INTRAVENOUS | Status: AC
  Filled 2020-02-23: qty 100

## 2020-02-23 MED ORDER — AMIODARONE HCL IN DEXTROSE 360-4.14 MG/200ML-% IV SOLN
30.0000 mg/h | INTRAVENOUS | Status: DC
  Administered 2020-02-24 – 2020-02-26 (×5): 30 mg/h via INTRAVENOUS
  Filled 2020-02-23 (×5): qty 200

## 2020-02-23 MED ORDER — AMIODARONE IV BOLUS ONLY 150 MG/100ML
INTRAVENOUS | Status: AC
  Administered 2020-02-23: 150 mg
  Filled 2020-02-23: qty 100

## 2020-02-23 MED ORDER — MIDAZOLAM HCL 2 MG/2ML IJ SOLN
2.0000 mg | INTRAMUSCULAR | Status: AC | PRN
  Administered 2020-02-23 – 2020-02-24 (×3): 2 mg via INTRAVENOUS
  Filled 2020-02-23: qty 2

## 2020-02-23 MED ORDER — METOPROLOL TARTRATE 5 MG/5ML IV SOLN
INTRAVENOUS | Status: AC
  Filled 2020-02-23: qty 5

## 2020-02-23 MED ORDER — LACTATED RINGERS IV BOLUS
1000.0000 mL | Freq: Once | INTRAVENOUS | Status: AC
  Administered 2020-02-24: 1000 mL via INTRAVENOUS

## 2020-02-23 MED ORDER — METOPROLOL TARTRATE 5 MG/5ML IV SOLN
5.0000 mg | INTRAVENOUS | Status: AC
  Administered 2020-02-23: 5 mg via INTRAVENOUS

## 2020-02-23 MED ORDER — FENTANYL BOLUS VIA INFUSION
50.0000 ug | INTRAVENOUS | Status: DC | PRN
  Filled 2020-02-23: qty 50

## 2020-02-23 MED ORDER — DEXTROSE 50 % IV SOLN: 0.0000 mL | INTRAVENOUS | Status: DC | PRN

## 2020-02-23 MED ORDER — POTASSIUM CHLORIDE 10 MEQ/100ML IV SOLN
10.0000 meq | INTRAVENOUS | Status: AC
  Filled 2020-02-23 (×2): qty 100

## 2020-02-23 MED ORDER — CHLORHEXIDINE GLUCONATE 0.12% ORAL RINSE (MEDLINE KIT)
15.0000 mL | Freq: Two times a day (BID) | OROMUCOSAL | Status: DC
  Administered 2020-02-23 – 2020-03-01 (×14): 15 mL via OROMUCOSAL

## 2020-02-23 MED ORDER — VASOPRESSIN 20 UNITS/100 ML INFUSION FOR SHOCK
0.0000 [IU]/min | INTRAVENOUS | Status: DC
  Administered 2020-02-24 (×3): 0.03 [IU]/min via INTRAVENOUS
  Filled 2020-02-23 (×4): qty 100

## 2020-02-23 MED ORDER — ALBUMIN HUMAN 5 % IV SOLN: 25.0000 g | Freq: Once | INTRAVENOUS | Status: AC

## 2020-02-23 MED ORDER — AMIODARONE LOAD VIA INFUSION
150.0000 mg | Freq: Once | INTRAVENOUS | Status: AC
  Administered 2020-02-23: 150 mg via INTRAVENOUS
  Filled 2020-02-23: qty 83.34

## 2020-02-23 MED ORDER — SODIUM CHLORIDE 0.9 % IV SOLN
3.0000 g | Freq: Four times a day (QID) | INTRAVENOUS | Status: DC
  Administered 2020-02-23 (×2): 3 g via INTRAVENOUS
  Filled 2020-02-23 (×4): qty 8

## 2020-02-23 MED ORDER — DEXTROSE IN LACTATED RINGERS 5 % IV SOLN: INTRAVENOUS | Status: DC

## 2020-02-23 MED ORDER — NITROGLYCERIN 0.4 MG SL SUBL
SUBLINGUAL_TABLET | SUBLINGUAL | Status: AC
  Filled 2020-02-23: qty 3

## 2020-02-23 MED ORDER — MIDAZOLAM HCL 2 MG/2ML IJ SOLN
2.0000 mg | INTRAMUSCULAR | Status: DC | PRN
  Administered 2020-02-24: 2 mg via INTRAVENOUS

## 2020-02-23 MED ORDER — SUCCINYLCHOLINE CHLORIDE 20 MG/ML IJ SOLN
INTRAMUSCULAR | Status: DC | PRN
  Administered 2020-02-23: 120 mg via INTRAVENOUS

## 2020-02-23 MED ORDER — FENTANYL 2500MCG IN NS 250ML (10MCG/ML) PREMIX INFUSION
50.0000 ug/h | INTRAVENOUS | Status: DC
  Administered 2020-02-23: 75 ug/h via INTRAVENOUS
  Administered 2020-02-24 (×2): 200 ug/h via INTRAVENOUS
  Administered 2020-02-26: 250 ug/h via INTRAVENOUS
  Administered 2020-02-26: 200 ug/h via INTRAVENOUS
  Administered 2020-02-27 – 2020-02-29 (×2): 100 ug/h via INTRAVENOUS
  Administered 2020-03-01: 250 ug/h via INTRAVENOUS
  Filled 2020-02-23 (×10): qty 250

## 2020-02-23 MED ORDER — FENTANYL CITRATE (PF) 100 MCG/2ML IJ SOLN
25.0000 ug | INTRAMUSCULAR | Status: DC | PRN
  Administered 2020-02-23: 100 ug via INTRAVENOUS
  Filled 2020-02-23: qty 2

## 2020-02-23 MED ORDER — NOREPINEPHRINE 4 MG/250ML-% IV SOLN
0.0000 ug/min | INTRAVENOUS | Status: DC
  Administered 2020-02-23: 5 ug/min via INTRAVENOUS
  Administered 2020-02-24: 10 ug/min via INTRAVENOUS
  Administered 2020-02-27: 2 ug/min via INTRAVENOUS
  Filled 2020-02-23 (×2): qty 250

## 2020-02-23 MED ORDER — INSULIN GLARGINE 100 UNIT/ML ~~LOC~~ SOLN
20.0000 [IU] | SUBCUTANEOUS | Status: DC
  Administered 2020-02-23: 20 [IU] via SUBCUTANEOUS
  Filled 2020-02-23 (×2): qty 0.2

## 2020-02-23 MED ORDER — NOREPINEPHRINE 4 MG/250ML-% IV SOLN
INTRAVENOUS | Status: AC
  Filled 2020-02-23: qty 250

## 2020-02-23 MED ORDER — MIDAZOLAM 50MG/50ML (1MG/ML) PREMIX INFUSION
0.5000 mg/h | INTRAVENOUS | Status: DC
  Administered 2020-02-23 (×2): 3 mg/h via INTRAVENOUS
  Administered 2020-02-24 (×2): 7 mg/h via INTRAVENOUS
  Administered 2020-02-26: 10 mg/h via INTRAVENOUS
  Administered 2020-02-27: 1 mg/h via INTRAVENOUS
  Filled 2020-02-23 (×9): qty 50

## 2020-02-23 MED ORDER — PIPERACILLIN-TAZOBACTAM 3.375 G IVPB 30 MIN: 3.3750 g | Freq: Four times a day (QID) | INTRAVENOUS | Status: DC

## 2020-02-23 MED ORDER — INSULIN ASPART 100 UNIT/ML ~~LOC~~ SOLN: 0.0000 [IU] | Freq: Three times a day (TID) | SUBCUTANEOUS | Status: DC

## 2020-02-23 NOTE — Progress Notes (Signed)
RN attempt x2 to call wife Salena Saner) to give spouse an update on pt.   Wife didn't pick up, voicemail not set up. Unable to leave a message.

## 2020-02-23 NOTE — Progress Notes (Signed)
eLink Physician-Brief Progress Note Patient Name: Colton Guzman DOB: 22-Dec-1960 MRN: [id number]   Date of Service  02/23/2020  HPI/Events of Note  Baseline Troponin 178 s/p cardiac arrest and CPR.  eICU Interventions  Continue to monitor cardiac enzymes, patient is not a candidate for PCI, Heparinization, beta blockers or Aspirin, at this time.        Kerry Kass Erlinda Solinger 02/23/2020, 11:43 PM

## 2020-02-23 NOTE — Progress Notes (Signed)
Chaplain was paged for support for this patient's family.  Patient coded earlier and family present.  Chaplain arrived and met with spouse and 2 children.  Patient's sister on the phone for prayer.  Patient overwhelmed with sudden changes.  Chaplain allowed space for the family to share concerns and feeling.  Chaplain offered prayer.  Family requested chaplain to lay hands on the patient once the medical team finishes the procedures.  Patient is Catholic and chaplain will pass on to day staff to see if family wants a [place] called.   [place], [place].    02/23/20 2212  Clinical Encounter Type  Visited With Health care provider;Family  Visit Type Critical Care  Referral From Nurse  Consult/Referral To Chaplain  Spiritual Encounters  Spiritual Needs Ritual;Prayer;Emotional  Stress Factors  Family Stress Factors Health changes

## 2020-02-23 NOTE — Progress Notes (Signed)
Dr. Lamonte Sakai notified in person of critical lab values lactic acid >11 at time 0620 and serum osmolality 336 at 0650.   MD is also aware that insulin gtt and fluids have not been started at this moment. Potassium gtt, per MD needs to be held till next BMET is drawn.

## 2020-02-23 NOTE — Care Plan (Addendum)
Notified by pt's wife who was at bedside that pt was having chest pain/ indigestion. This rn to bedside to assess pt. Pt stated that the pain was substernal and described the pain as "indigestion" and rated the pain as 3/10. Pt observed to be belching and wincing. Rn placed standing order for maalox but noticed EKG changes on the beside ICU monitor. This RN retrieved EKG machine and completed STAT EKG. EKG's initial reading was "accelerated junctional rhythm, septal infarct age undertermined, and marked ST abnormality, possible anterior subendocardial injury, abnormal EKG". RN requested assistance from charge RN who paged on call NP for [facility], B. Randol Kern. Pt given sublingual nitro x1 for chest pain per standing orders, which relieved pt's chest pain. After administration of SL nitro, received call back from [place] NP, and this RN gave an update on the pt's current condition as well as result of EKG. At this time, pt was beginning to throw paired PVCs. While this RN on the phone with NP, pt's rhythm changed to V-Tach. Pt initially had a pulse and was responsive, but pt began to have snoring respirations and lost consciousness. CPR was initiated by this RN while a code was called overhead. See code documentation for further details.

## 2020-02-23 NOTE — Consult Note (Signed)
NAMEAttilio Guzman, MRN:  [id number], DOB:  June 02, 1960, LOS: 1 ADMISSION DATE:  02/22/2020, CONSULTATION DATE:  02/23/20 REFERRING MD:  Dr Bonner Puna, CHIEF COMPLAINT:  DKA, lethargy   Brief History:  60 year old man with hypertension, diabetes admitted with apparent lower GI bleeding, hematochezia.  Progressive lethargy, relative hypotension, continued GI blood loss.  Labs consistent with evolving hyperglycemia and metabolic acidosis.  To SDU 1/4  History of Present Illness:  60 year old man with hypertension, type 2 diabetes.  Admitted with painless hematochezia 1/3, leukocytosis hemoglobin 13.2 >> 11.6 with IV fluids.  CT abdomen with angiography 1/3 did not identify active bleeding source.  Has had several more episodes of BRBPR since arrival and admission.  Denies any significant alcohol use, is on aspirin.  Has had rising CBG, relative hypotension, some lethargy overnight.  VBG performed that identifies significant metabolic acidosis.  Moving stepdown status morning 1/4, PCCM consulted to evaluate  Past Medical History:   Past Medical History:  Diagnosis Date  . Diabetes mellitus without complication ([place])   . Hypertension     Significant Hospital Events:    Consults:  GI  Procedures:    Significant Diagnostic Tests:  CT abdomen/pelvis 1/3 >> hepatic steatosis, cholelithiasis and absence of GB or ductal abnormality, normal pancreas, normal ureters, mild circumferential thickening of the descending colon question infectious or inflammatory colitis with scattered diverticuli, no evidence of GI blood loss.  Bilateral fat-containing inguinal hernias  Micro Data:  SARS CoV2 1/3 >> negative Blood 1/3 >>   Antimicrobials:    Interim History / Subjective:  Complains of being thirsty Denies any abdominal pain  Objective   Blood pressure (!) 112/55, pulse (!) 122, temperature 97.7 F (36.5 C), temperature source Axillary, resp. rate (!) 30, height 5\' 8"  (1.727 m), weight 88.2 kg,  SpO2 100 %.        Intake/Output Summary (Last 24 hours) at 02/23/2020 6644 Last data filed at 02/23/2020 0347 Gross per 24 hour  Intake 752.93 ml  Output 600 ml  Net 152.93 ml   Filed Weights   02/22/20 1130 02/22/20 1759 02/22/20 2246  Weight: 88.2 kg 88.2 kg 88.2 kg    Examination: General: Pale gentleman, comfortable in bed HENT: Oropharynx somewhat dry, no lesions, pupils equal and reactive Lungs: Clear bilaterally, no crackles, no wheezes Cardiovascular: Regular, Tachycardic 110, no murmur Abdomen: No focal tenderness nondistended, positive bowel sounds Extremities: No edema Neuro: Awake, alert, interacting appropriately, answers questions, follows commands, good strength  Resolved Hospital Problem list     Assessment & Plan:  Diabetes mellitus 2 with evolving DKA/HHS -Agree with move to stepdown, initiate insulin infusion, DKA protocol with IV fluid resuscitation -Hold Metformin and glyburide on hold -Beta hydroxybutyrate pending  Anion gap metabolic acidosis.  Suspect large component of this is DKA.  Consider other causes especially lactic acidosis. -DKA management as above -Check lactic acid, at risk hypoperfusion in setting of GI blood loss -Consider role of metformin in lactic acidosis in absence of any clear evidence infection  Acute GI bleed, appears lower based on history, hematochezia -Agree with either repeat CT angio or possibly nuclear tagged red cell scan 1/4. -GI has been consulted, has not yet seen the patient.  IM to call them and discuss change in his status.  Suspect he will require CSY, EGD -Question whether he may require IR evaluation for possible embolization, would need bleed localization first -Serial CBC.  Goal hemoglobin 8.0 in setting of active blood loss  Colonic thickening, possible  inflammatory versus infectious colitis by CT abdomen.  Associated with leukocytosis -Low threshold to start empiric antibiotics, consider Cipro/Flagyl for  presumed intra-abdominal source -Obtain blood cultures now, consider empiric antibiotics after drawn  Hypertension -Home Hyzaar, metoprolol, amlodipine regimen currently on hold  Hyperlipidemia -Home atorvastatin on hold    Best practice (evaluated daily)  Diet: NPO Pain/Anxiety/Delirium protocol (if indicated): N/A VAP protocol (if indicated): N/A DVT prophylaxis: SCD, no anticoagulation GI prophylaxis: PPI twice daily Glucose control: Starting insulin infusion 1/4 Mobility: BR Disposition: Stepdown status  Goals of Care:  Last date of multidisciplinary goals of care discussion: Pending Family and staff present:  Summary of discussion:  Follow up goals of care discussion due:  Code Status: Full code  Labs   CBC: Recent Labs  Lab 02/22/20 1215 02/22/20 1703 02/22/20 2104 02/23/20 0432 02/23/20 0435  WBC 15.1* 20.4*  --  19.7*  --   NEUTROABS  --   --   --  14.9*  --   HGB 13.2 11.6* 8.9* 9.9* 9.8*  HCT 37.2* 32.8* 26.6* 30.6* 31.1*  MCV 91.2 91.6  --  96.8  --   PLT 212 203  --  191  --     Basic Metabolic Panel: Recent Labs  Lab 02/22/20 1215 02/22/20 2107 02/23/20 0432 02/23/20 0435  NA 134* 135  --  PENDING  K 4.2 4.7  --  PENDING  CL 98 104  --  PENDING  CO2 21* 15*  --  PENDING  GLUCOSE 417* 457*  --  PENDING  BUN 18 18  --  22*  CREATININE 0.95 1.10  --  1.50*  CALCIUM 9.0 7.9*  --  PENDING  MG  --   --  2.1  --    GFR: Estimated Creatinine Clearance: 58.6 mL/min (A) (by C-G formula based on SCr of 1.5 mg/dL (H)). Recent Labs  Lab 02/22/20 1215 02/22/20 1703 02/23/20 0432  WBC 15.1* 20.4* 19.7*    Liver Function Tests: Recent Labs  Lab 02/22/20 1215 02/22/20 2107  AST 29 29  ALT 43 <5  ALKPHOS 69 44  BILITOT 1.1 0.9  PROT 8.3* 6.1*  ALBUMIN 4.1 3.1*   Recent Labs  Lab 02/23/20 0450  LIPASE 53*   No results for input(s): AMMONIA in the last 168 hours.  ABG    Component Value Date/Time   HCO3 12.0 (L) 02/23/2020 0450    ACIDBASEDEF 15.7 (H) 02/23/2020 0450   O2SAT 13.6 02/23/2020 0450     Coagulation Profile: Recent Labs  Lab 02/22/20 2104  INR 1.3*    Cardiac Enzymes: No results for input(s): CKTOTAL, CKMB, CKMBINDEX, TROPONINI in the last 168 hours.  HbA1C: No results found for: HGBA1C  CBG: Recent Labs  Lab 02/22/20 2004 02/23/20 0407 02/23/20 0432 02/23/20 0553  GLUCAP 425* 510* 481* 450*    Review of Systems:   Some mild abdominal discomfort, generalized Complains of being thirsty, hungry  Past Medical History:  He,  has a past medical history of Diabetes mellitus without complication ([place]) and Hypertension.   Surgical History:  History reviewed. No pertinent surgical history.   Social History:   reports that he has never smoked. He has never used smokeless tobacco. He reports current alcohol use.   Family History:  His family history is not on file.   Allergies No Known Allergies   Home Medications  Prior to Admission medications   Medication Sig Start Date End Date Taking? Authorizing Provider  allopurinol (ZYLOPRIM) 100 MG tablet  Take 100 mg by mouth daily.   Yes [provider]  amLODipine (NORVASC) 10 MG tablet Take 10 mg by mouth daily.   Yes [provider]  Ascorbic Acid (VITAMIN C) 1000 MG tablet Take 1,000 mg by mouth daily.   Yes [provider]  aspirin EC 81 MG tablet Take 81 mg by mouth daily. Swallow whole.   Yes [provider]  atorvastatin (LIPITOR) 40 MG tablet Take 40 mg by mouth daily. 01/05/20  Yes [provider]  glyBURIDE (DIABETA) 5 MG tablet Take 5 mg by mouth 2 (two) times daily with a meal.   Yes [provider]  losartan-hydrochlorothiazide (HYZAAR) 100-25 MG tablet Take 1 tablet by mouth daily. 01/05/20  Yes [provider]  Magnesium 300 MG CAPS Take 300 mg by mouth daily.   Yes [provider]  metFORMIN (GLUCOPHAGE) 1000 MG tablet Take 1,000 mg by mouth 2 (two) times  daily with a meal.   Yes [provider]  metoprolol tartrate (LOPRESSOR) 25 MG tablet Take 25 mg by mouth 2 (two) times daily. 01/05/20  Yes [provider]  Multiple Vitamin (MULTIVITAMIN WITH MINERALS) TABS tablet Take 1 tablet by mouth daily.   Yes [provider]  Vitamin D, Ergocalciferol, (DRISDOL) 1.25 MG (50000 UNIT) CAPS capsule Take 50,000 Units by mouth every 7 (seven) days.   Yes [provider]     Critical care time: 24 min     Baltazar Apo, MD, PhD 02/23/2020, 6:50 AM [facility] [phone] or if no answer [phone]

## 2020-02-23 NOTE — Progress Notes (Signed)
Update given to wife (Colton Guzman)

## 2020-02-23 NOTE — Progress Notes (Signed)
Endo Tool was not started due to lack of IV access. Blood was instead prioritized

## 2020-02-23 NOTE — Progress Notes (Signed)
Attempt to place PIV x4 times unsuccessfully. IV team consulted.

## 2020-02-23 NOTE — Progress Notes (Addendum)
    BRIEF OVERNIGHT PROGRESS REPORT  SUBJECTIVE: progressive hyperglycemia [phone] despite correction with insulin Aspart. Patient also noted to be hypotensive with episode of massive bleeding from rectal  containing numerous blood clots. He was very short of breath, diaphoretic and lethargic.  OBJECTIVE: On bedside assessment, he was afebrile with blood pressure 100/63 mm Hg and pulse rate 121 beats/min. There were no focal neurological deficits; he was alert and oriented x4, and he did not demonstrate any memory deficits. No s/s of polyddipsia, nausea or vomiting, tachypnea (Kussmaul's breathing), altered mental status. Patient did c/o mild vague abdominal discomfort.  ASSESSMENT & PLAN:  Acute blood loss anemia with hemorrhagic shock due to Acute lower GI Bleed Hgb drop from 13.2 to 9.9 over past 24 hours.  -Transfer to stepdown - At least 2x IV access, 18 gauge or larger - IVF resuscitation to maintain MAP>65 - H&H monitoring q6h - Blood Consent.  Transfuse PRN Hgb<8 (Massive Transfusion Protocol option or empiric FFP+Platelets if needing more than 2-4 units PRBC) - Pantoprazole 80mg  IV x1 then gtt 8mg /hr - NPO for pending endoscopy - GI Consult for EGD/Colonoscopy. Paged Dr. Therisa Doyne x 2 waiting callback -Will repeat CT angio to isolate source and  If massive bleed continues may need IR Consult for embolization - Helicobacter pylori Ab + stool Ag. - Hold NSAIDs, steroids, ASA - PCCM Consult STAT   Diabetic Ketoacidosis  Suspect physiologic stress response in the setting of  Acute blood loss anemia , possible inflammatory/infectious colitis and cholelithiasis  in a patient with hx of DM   - Check UA, Beta-hydroxybutyrate (assess for presence of ketones) - Serum Osmolality - ABG/VBG to assess severity of acidemia - Check CXR, UA, Blood Cultures, Lactic acid and procalcitonin to evaluate for infectious process - Troponin, EKG for ischemia - Lipase for pancreatitis, Lactic  acid - Insulin drip, DKA protocol (NPO) start regular) 0.1u/kg (~10 units) IV x1 - Isotonic (NS,LR) bolus 1L / hr until euvolemic  - Foley catheter for high urine output monitoring - Consider arterial/central line for repeated labs - Glucose: q1h to titrate insulin - Lab monitoring: q2-4h BMP+Phosphorus+pH (ABG/VBG)  - Goal to normalize anion gap (eliminate beta-hydroxy butyrate / ketones) due to insulin deficiency       Rufina Falco, BSN, MSN, DNP, CCRN, FNP-C, AGACNP-student [facility] Nurse Practitioner  [facility]   Between 7pm to 7am - Pager - [facility]  Office  [phone]

## 2020-02-23 NOTE — Consult Note (Signed)
NAMELandon Guzman, MRN:  [id number], DOB:  Sep 28, 1960, LOS: 1 ADMISSION DATE:  02/22/2020, CONSULTATION DATE:  02/23/20 REFERRING MD:  Dr Bonner Puna, CHIEF COMPLAINT:  DKA, lethargy   Brief History:  60 year old man with hypertension, diabetes admitted with apparent lower GI bleeding, hematochezia.  Progressive lethargy, relative hypotension, continued GI blood loss.  Labs consistent with evolving hyperglycemia and metabolic acidosis.  To SDU 1/4  History of Present Illness:  60 year old man with hypertension, type 2 diabetes.  Admitted with painless hematochezia 1/3, leukocytosis hemoglobin 13.2 >> 11.6 with IV fluids.  CT abdomen with angiography 1/3 did not identify active bleeding source.  Has had several more episodes of BRBPR since arrival and admission.  Denies any significant alcohol use, is on aspirin.  Has had rising CBG, relative hypotension, some lethargy overnight.  VBG performed that identifies significant metabolic acidosis.  Moving stepdown status morning 1/4, PCCM consulted to evaluate.   Past Medical History:   Past Medical History:  Diagnosis Date  . Diabetes mellitus without complication ([place])   . Hypertension     Significant Hospital Events:    Consults:  GI  Procedures:    Significant Diagnostic Tests:  CT abdomen/pelvis 1/3 >> hepatic steatosis, cholelithiasis and absence of GB or ductal abnormality, normal pancreas, normal ureters, mild circumferential thickening of the descending colon question infectious or inflammatory colitis with scattered diverticuli, no evidence of GI blood loss.  Bilateral fat-containing inguinal hernias  Micro Data:  SARS CoV2 1/3 >> negative Blood 1/3 >>   Antimicrobials:    Interim History / Subjective:   Patient plan chest discomfort around 1745, received nitroglycerin x1.  Recurrent chest discomfort and then VT on monitor.  He was emergently cardioverted, CPR approximately 2 minutes. ET intubated by CRNA, received etomidate and  succinylcholine.  Not yet on sedation Had large-volume bright red blood per rectum at the time of arrest.   Objective   Blood pressure (!) 151/105, pulse (!) 138, temperature 98.2 F (36.8 C), temperature source Axillary, resp. rate 13, height 5\' 8"  (1.727 m), weight 88.2 kg, SpO2 100 %.        Intake/Output Summary (Last 24 hours) at 02/23/2020 2049 Last data filed at 02/23/2020 1919 Gross per 24 hour  Intake 4373.4 ml  Output 1600 ml  Net 2773.4 ml   Filed Weights   02/22/20 1130 02/22/20 1759 02/22/20 2246  Weight: 88.2 kg 88.2 kg 88.2 kg    Examination: General: Pale gentleman, ill appearing, sdated HENT: ETT in place, pupils react Lungs: Clear bilaterally, no crackles, no wheezes Cardiovascular: irreg irreg 140-150 Abdomen: non-distended, hypoactive BS Extremities: no edema Neuro: grimace with stim, purposeful movement B UE's, does not follow commands.   Resolved Hospital Problem list     Assessment & Plan:   VT arrest, in the setting of acute GI blood loss, presumed hyperperfusion.  ECG just prior and then post resuscitation with anterior ST elevations. A Fib + RVR post resuscitation -Not in a position to initiate heparin given GI bleeding is underlying problem. -Discussed ECG with Dr. Angelena Form with cardiology.  Patient's not in a position to go to Cath Lab at this time since anticoagulation will be contraindicated given his GI bleeding.  Suspect that he does have stress related ischemia, will need a CAD evaluation once stabilized from acute -amiodarone started, consider rate control w metoprolol if BP stable once sedated  Acute respiratory failure and mechanical ventilation post-arrest -PRVC 8 cc/kg -Check chest x-ray to confirm tube placement -Follow ABG  Diabetes mellitus 2 with evolving DKA/HHS -Anion gap and CO2 both improved on afternoon labs 1/4.  Plan to continue insulin infusion now given his recent instability, follow BMP -Metformin and glyburide on  hold  Anion gap metabolic acidosis.  Due to DKA and also component of this was lactic acidosis, was improving with volume resuscitation and treatment of his DKA -DKA management as above -Check lactic acid, at risk hypoperfusion in setting of GI blood loss -Consider role of metformin in lactic acidosis in absence of any clear evidence infection  Acute GI bleed, appears lower based on history, hematochezia -Continue GI blood loss, repeat hemoglobin 7.4 < 8.4 < 9.4 -will discuss w GI. /? Whether role for tagged RBC scan vs IR angio and embolization.  -follow freq CBC and transfuse PRBC goal 8.0 Hgb  Colonic thickening, possible inflammatory versus infectious colitis by CT abdomen.  Associated with leukocytosis -was started on unasyn 12/4, expanded now to zosyn.  -follow cx data  Hx Hypertension -Home Hyzaar, metoprolol, amlodipine regimen currently on hold  Hyperlipidemia -Home atorvastatin on hold   Best practice (evaluated daily)  Diet: NPO Pain/Anxiety/Delirium protocol (if indicated): N/A VAP protocol (if indicated): N/A DVT prophylaxis: SCD, no anticoagulation GI prophylaxis: PPI twice daily Glucose control: Starting insulin infusion 1/4 Mobility: BR Disposition: ICU Family: I updated the patient's wife at bedside 12/4  Goals of Care:  Last date of multidisciplinary goals of care discussion: Pending Family and staff present:  Summary of discussion:  Follow up goals of care discussion due:  Code Status: Full code  Labs   CBC: Recent Labs  Lab 02/22/20 1215 02/22/20 1703 02/22/20 2104 02/23/20 0432 02/23/20 0435 02/23/20 1113 02/23/20 1522  WBC 15.1* 20.4*  --  19.7*  --  18.8*  --   NEUTROABS  --   --   --  14.9*  --   --   --   HGB 13.2 11.6* 8.9* 9.9* 9.8* 9.4* 8.4*  HCT 37.2* 32.8* 26.6* 30.6* 31.1* 27.7* 24.3*  MCV 91.2 91.6  --  96.8  --  93.9  --   PLT 212 203  --  191  --  125*  --     Basic Metabolic Panel: Recent Labs  Lab 02/22/20 1215  02/22/20 2107 02/23/20 0432 02/23/20 0435 02/23/20 1113  NA 134* 135  --  139 137  K 4.2 4.7  --  4.8 3.9  CL 98 104  --  103 104  CO2 21* 15*  --  11* 22  GLUCOSE 417* 457*  --  551* 269*  BUN 18 18  --  22* 21*  CREATININE 0.95 1.10  --  1.50* 1.02  CALCIUM 9.0 7.9*  --  8.4* 8.2*  MG  --   --  2.1  --   --    GFR: Estimated Creatinine Clearance: 86.2 mL/min (by C-G formula based on SCr of 1.02 mg/dL). Recent Labs  Lab 02/22/20 1215 02/22/20 1703 02/23/20 0432 02/23/20 0435 02/23/20 0534 02/23/20 1113  PROCALCITON  --   --   --  0.29  --   --   WBC 15.1* 20.4* 19.7*  --   --  18.8*  LATICACIDVEN  --   --   --   --  >11.0* 5.0*    Liver Function Tests: Recent Labs  Lab 02/22/20 1215 02/22/20 2107  AST 29 29  ALT 43 <5  ALKPHOS 69 44  BILITOT 1.1 0.9  PROT 8.3* 6.1*  ALBUMIN 4.1 3.1*  Recent Labs  Lab 02/23/20 0450  LIPASE 53*   No results for input(s): AMMONIA in the last 168 hours.  ABG    Component Value Date/Time   HCO3 12.0 (L) 02/23/2020 0450   ACIDBASEDEF 15.7 (H) 02/23/2020 0450   O2SAT 13.6 02/23/2020 0450     Coagulation Profile: Recent Labs  Lab 02/22/20 2104  INR 1.3*    Cardiac Enzymes: No results for input(s): CKTOTAL, CKMB, CKMBINDEX, TROPONINI in the last 168 hours.  HbA1C: Hgb A1c MFr Bld  Date/Time Value Ref Range Status  02/22/2020 09:04 PM 10.3 (H) 4.8 - 5.6 % Final    Comment:    (NOTE) Pre diabetes:          5.7%-6.4%  Diabetes:              >6.4%  Glycemic control for   <7.0% adults with diabetes     CBG: Recent Labs  Lab 02/23/20 1455 02/23/20 1556 02/23/20 1656 02/23/20 1753 02/23/20 1851  GLUCAP 202* 187* 194* 167* 159*    Critical care time: 65 min     Baltazar Apo, MD, PhD 02/23/2020, 8:49 PM [facility] [phone] or if no answer [phone]

## 2020-02-23 NOTE — Anesthesia Procedure Notes (Signed)
Procedure Name: Intubation Performed by: Gean Maidens, CRNA Pre-anesthesia Checklist: Patient identified, Emergency Drugs available, Suction available, Patient being monitored and Timeout performed Patient Re-evaluated:Patient Re-evaluated prior to induction Oxygen Delivery Method: Ambu bag Preoxygenation: Pre-oxygenation with 100% oxygen Induction Type: IV induction and Rapid sequence Laryngoscope Size: Glidescope and 4 Grade View: Grade I Tube type: Oral Tube size: 7.5 mm Number of attempts: 1 Airway Equipment and Method: Stylet and Video-laryngoscopy Placement Confirmation: ETT inserted through vocal cords under direct vision,  CO2 detector and breath sounds checked- equal and bilateral Secured at: 23 cm Tube secured with: Tape Dental Injury: Teeth and Oropharynx as per pre-operative assessment

## 2020-02-23 NOTE — Progress Notes (Signed)
Patient attempted to get up to void and had blood and clots covering bed. NP Ouma was at the bedside and placed order for transfer. Rapid response was called and came to assess patient, patient was transferred to step down and report was given to ICU RN.

## 2020-02-23 NOTE — Progress Notes (Signed)
Rapid Response Event Note   Reason for Call :  Extremely large bloody BM & high risk for decompensation  Initial Focused Assessment:  Floor RN and charge at the bedside w/ NTs cleaning massive BM. Frank red blood and large clots seen. Pt oriented x4 but lethargic and pale in color. Pt arousalable to voice but closes eye immediately after talking. Vital signs taken: HR 116, BP 126/71 (87), O2 97-100% on 2L [place], Temp 97.8 (oral), BS 481. Hbg dropped from 13.2 to 9.8 in less than 24hrs. Pt has had consistently high BS in the 500-400s since admission w/o insulin gtt intervention or consult to rapid response.   Plan of Care:  Pt tx to ICU bed 1231 IV consult ordered d/t unsuccessful PIV attempts x4 Foley placed d/t severity of illness PRBC x2 ordered Replacement K+ held til next Bmet    Event Summary:  Wife updated on plan and pt status change   MD Notified: Stark Klein, NP ([facility]) Brock Ra, MD (CCM) Call Time: [place] Time: 0960A End Time: [place], RN

## 2020-02-23 NOTE — Progress Notes (Signed)
CRITICAL VALUE ALERT  Critical Value:  Troponin 178  Date & Time Notied:  02/23/20 @ 2212  Provider Notified: Lysbeth Galas RN/Dr. Lucile Shutters  Orders Received/Actions taken: pending

## 2020-02-23 NOTE — Progress Notes (Signed)
NP Ouma was paged about patient's BG being 510.

## 2020-02-23 NOTE — Procedures (Signed)
Central Venous Catheter Insertion Procedure Note  Colton Guzman  [id number]  December 08, 1960  Date:02/23/20  Time:10:17 PM   Provider Performing:Audel Coakley S Kyel Purk   Procedure: Insertion of Non-tunneled Central Venous [phone]) with US guidance ([id number])   Indication(s) Medication administration  Consent Risks of the procedure as well as the alternatives and risks of each were explained to the patient and/or caregiver.  Consent for the procedure was obtained and is signed in the bedside chart  Anesthesia Topical only with 1% lidocaine   Timeout Verified patient identification, verified procedure, site/side was marked, verified correct patient position, special equipment/implants available, medications/allergies/relevant history reviewed, required imaging and test results available.  Sterile Technique Maximal sterile technique including full sterile barrier drape, hand hygiene, sterile gown, sterile gloves, mask, hair covering, sterile ultrasound probe cover (if used).  Procedure Description Area of catheter insertion was cleaned with chlorhexidine and draped in sterile fashion.  With real-time ultrasound guidance a central venous catheter was placed into the right internal jugular vein. Nonpulsatile blood flow and easy flushing noted in all ports.  The catheter was sutured in place and sterile dressing applied.  Complications/Tolerance None; patient tolerated the procedure well. Chest X-ray is ordered to verify placement for internal jugular or subclavian cannulation.   Chest x-ray is not ordered for femoral cannulation.  EBL Minimal  Specimen(s) None  Baltazar Apo, MD, PhD 02/23/2020, 10:18 PM [facility] [phone] or if no answer [phone]

## 2020-02-23 NOTE — Progress Notes (Signed)
eLink Physician-Brief Progress Note Patient Name: Colton Guzman DOB: 10-02-1960 MRN: [id number]   Date of Service  02/23/2020  HPI/Events of Note  I was requested to camera into the room of a patient who was coding, on entering the room CPR in progress, patient re-established a rhythm and CPR was terminated.  eICU Interventions  CRNA  was requested to intubate patient, IO ordered for reliable resuscitation  Fluids and medication access, stat labs were requested, 1 liter of crystalloid + 500 ml of 5 % Albumin ordered as fluid boluses, mechanical ventilation + sedation orders entered, Dr. Court Joy of PCCM ground team requested to come and see patient, and I terminated camera consultation after he arrived on the scene.        [name] 02/23/2020, 9:46 PM

## 2020-02-23 NOTE — Progress Notes (Signed)
eLink Physician-Brief Progress Note Patient Name: Colton Guzman DOB: 01-02-1961 MRN: [id number]   Date of Service  02/23/2020  HPI/Events of Note  Patient admitted to the ICU with DKA.  eICU Interventions  New Patient Evaluation completed.        Kerry Kass Jahmez Bily 02/23/2020, 6:02 AM

## 2020-02-23 NOTE — Progress Notes (Signed)
PROGRESS NOTE    Colton Guzman  GMW:[id number] DOB: 1960/03/09 DOA: 02/22/2020 PCP: Patient, No Pcp Per    Brief Narrative:  60 year old male with a history of hypertension diabetes on daily aspirin, admitted to the hospital with hematochezia.  Patient is significant decline in hemoglobin from 13.2 on admission down to the range of 8.  GI consulted.  Plans on endoscopic evaluation once patient is further stabilized.  He was also noted to be in DKA.  He was transferred to stepdown and started on insulin infusion and IV fluids.  Critical care following.   Assessment & Plan:   Principal Problem:   Acute lower GI bleeding Active Problems:   Acute blood loss anemia   T2DM (type 2 diabetes mellitus) ([place])   Hypertension   Hyperlipidemia   Gout   Acute GI bleeding -Patient presented with large amounts of blood per rectum -GI following -Plans are for colonoscopy once metabolic issues have stabilized -Patient did have CT angiogram of the abdomen pelvis on admission that did not show any signs of active bleeding -If he continues to have large volume/destabilizing GI bleed, may need repeat CT versus bleeding scan after discussion with IR  Acute blood loss anemia -Baseline hemoglobin of 13, this has trended down to 8.9 -Patient was transfused 1 unit of PRBC -Follow-up hemoglobin improved to 9.9 -Continue to follow hemoglobin with goal to keep hemoglobin > 8 in the setting of bleeding  Diabetic ketoacidosis and type II diabetic -Patient noted to have significant anion gap and elevated blood sugars -Beta hydroxybutyric acid also mildly elevated -Started on IV fluids and insulin infusion -Overall anion gap has improved -We will transition to subcutaneous insulin  Lactic acidosis -Possibly hypoperfusion in the setting of GI bleed with concomitant Metformin use -Blood pressures currently stable -Repeat lactic acid trending down  Hypertension -Home antihypertensives currently on hold in  setting of GI bleeding  Hyperlipidemia -Resume statin when able  Acute kidney injury -Suspect related to hypovolemia in the setting of GI bleed -Follow urine output and recheck labs  Possible colitis -Noted on CT scan to have possible colitis -He is not having frequent stools at this time -Blood cultures have been sent -Started empirically on antibiotics with Unasyn   DVT prophylaxis: SCDs Start: 02/22/20 1838  Code Status: Full code Family Communication: Discussed with patient Disposition Plan: Status is: Inpatient  Remains inpatient appropriate because:Inpatient level of care appropriate due to severity of illness   Dispo: The patient is from: Home              Anticipated d/c is to: Home              Anticipated d/c date is: 3 days              Patient currently is not medically stable to d/c.   Consultants:   Critical care  Gastroenterology  Procedures:     Antimicrobials:   Unasyn 1/4 >   Subjective: Patient denies any abdominal pain.  No nausea or vomiting.  Events from overnight noted.  Patient large bloody bowel movement with bright red blood and clots last night.  He was transferred to ICU for further care.  Since that time, staff reports that he has had very small amounts of blood per rectum  Objective: Vitals:   02/23/20 1000 02/23/20 1015 02/23/20 1030 02/23/20 1045  BP:   135/78   Pulse: (!) 107 (!) 105 (!) 113 (!) 106  Resp: 17 (!) 21 (!) 24  20  Temp:      TempSrc:      SpO2: 100% 98% 100% 100%  Weight:      Height:        Intake/Output Summary (Last 24 hours) at 02/23/2020 1054 Last data filed at 02/23/2020 1022 Gross per 24 hour  Intake 3296.73 ml  Output 600 ml  Net 2696.73 ml   Filed Weights   02/22/20 1130 02/22/20 1759 02/22/20 2246  Weight: 88.2 kg 88.2 kg 88.2 kg    Examination:  General exam: Appears calm and comfortable  Respiratory system: Clear to auscultation. Respiratory effort normal. Cardiovascular system: S1 & S2  heard, RRR. No JVD, murmurs, rubs, gallops or clicks. No pedal edema. Gastrointestinal system: Abdomen is nondistended, soft and nontender. No organomegaly or masses felt. Normal bowel sounds heard. Central nervous system: Alert and oriented. No focal neurological deficits. Extremities: Symmetric 5 x 5 power. Skin: No rashes, lesions or ulcers Psychiatry: Judgement and insight appear normal. Mood & affect appropriate.     Data Reviewed: I have personally reviewed following labs and imaging studies  CBC: Recent Labs  Lab 02/22/20 1215 02/22/20 1703 02/22/20 2104 02/23/20 0432 02/23/20 0435  WBC 15.1* 20.4*  --  19.7*  --   NEUTROABS  --   --   --  14.9*  --   HGB 13.2 11.6* 8.9* 9.9* 9.8*  HCT 37.2* 32.8* 26.6* 30.6* 31.1*  MCV 91.2 91.6  --  96.8  --   PLT 212 203  --  191  --    Basic Metabolic Panel: Recent Labs  Lab 02/22/20 1215 02/22/20 2107 02/23/20 0432 02/23/20 0435  NA 134* 135  --  139  K 4.2 4.7  --  4.8  CL 98 104  --  103  CO2 21* 15*  --  11*  GLUCOSE 417* 457*  --  551*  BUN 18 18  --  22*  CREATININE 0.95 1.10  --  1.50*  CALCIUM 9.0 7.9*  --  8.4*  MG  --   --  2.1  --    GFR: Estimated Creatinine Clearance: 58.6 mL/min (A) (by C-G formula based on SCr of 1.5 mg/dL (H)). Liver Function Tests: Recent Labs  Lab 02/22/20 1215 02/22/20 2107  AST 29 29  ALT 43 <5  ALKPHOS 69 44  BILITOT 1.1 0.9  PROT 8.3* 6.1*  ALBUMIN 4.1 3.1*   Recent Labs  Lab 02/23/20 0450  LIPASE 53*   No results for input(s): AMMONIA in the last 168 hours. Coagulation Profile: Recent Labs  Lab 02/22/20 2104  INR 1.3*   Cardiac Enzymes: No results for input(s): CKTOTAL, CKMB, CKMBINDEX, TROPONINI in the last 168 hours. BNP (last 3 results) No results for input(s): PROBNP in the last 8760 hours. HbA1C: No results for input(s): HGBA1C in the last 72 hours. CBG: Recent Labs  Lab 02/23/20 0553 02/23/20 0726 02/23/20 0829 02/23/20 0950 02/23/20 1042   GLUCAP 450* 444* 346* 274* 255*   Lipid Profile: No results for input(s): CHOL, HDL, LDLCALC, TRIG, CHOLHDL, LDLDIRECT in the last 72 hours. Thyroid Function Tests: No results for input(s): TSH, T4TOTAL, FREET4, T3FREE, THYROIDAB in the last 72 hours. Anemia Panel: No results for input(s): VITAMINB12, FOLATE, FERRITIN, TIBC, IRON, RETICCTPCT in the last 72 hours. Sepsis Labs: Recent Labs  Lab 02/23/20 0435 02/23/20 0534  PROCALCITON 0.29  --   LATICACIDVEN  --  >11.0*    Recent Results (from the past 240 hour(s))  Resp Panel by RT-PCR (  Flu A&B, Covid) Nasopharyngeal Swab     Status: None   Collection Time: 02/22/20  2:49 PM   Specimen: Nasopharyngeal Swab; Nasopharyngeal(NP) swabs in vial transport medium  Result Value Ref Range Status   SARS Coronavirus 2 by RT PCR NEGATIVE NEGATIVE Final    Comment: (NOTE) SARS-CoV-2 target nucleic acids are NOT DETECTED.  The SARS-CoV-2 RNA is generally detectable in upper respiratory specimens during the acute phase of infection. The lowest concentration of SARS-CoV-2 viral copies this assay can detect is 138 copies/mL. A negative result does not preclude SARS-Cov-2 infection and should not be used as the sole basis for treatment or other patient management decisions. A negative result may occur with  improper specimen collection/handling, submission of specimen other than nasopharyngeal swab, presence of viral mutation(s) within the areas targeted by this assay, and inadequate number of viral copies(<138 copies/mL). A negative result must be combined with clinical observations, patient history, and epidemiological information. The expected result is Negative.  Fact Sheet for Patients:  EntrepreneurPulse.com.au  Fact Sheet for Healthcare Providers:  IncredibleEmployment.be  This test is no t yet approved or cleared by the [place] FDA and  has been authorized for detection and/or diagnosis of  SARS-CoV-2 by FDA under an Emergency Use Authorization (EUA). This EUA will remain  in effect (meaning this test can be used) for the duration of the COVID-19 declaration under Section 564(b)(1) of the Act, 21 U.S.C.section 360bbb-3(b)(1), unless the authorization is terminated  or revoked sooner.       Influenza A by PCR NEGATIVE NEGATIVE Final   Influenza B by PCR NEGATIVE NEGATIVE Final    Comment: (NOTE) The Xpert Xpress SARS-CoV-2/FLU/RSV plus assay is intended as an aid in the diagnosis of influenza from Nasopharyngeal swab specimens and should not be used as a sole basis for treatment. Nasal washings and aspirates are unacceptable for Xpert Xpress SARS-CoV-2/FLU/RSV testing.  Fact Sheet for Patients: EntrepreneurPulse.com.au  Fact Sheet for Healthcare Providers: IncredibleEmployment.be  This test is not yet approved or cleared by the [place] FDA and has been authorized for detection and/or diagnosis of SARS-CoV-2 by FDA under an Emergency Use Authorization (EUA). This EUA will remain in effect (meaning this test can be used) for the duration of the COVID-19 declaration under Section 564(b)(1) of the Act, 21 U.S.C. section 360bbb-3(b)(1), unless the authorization is terminated or revoked.  Performed at [facility], [address] [place], [place] [address]   MRSA PCR Screening     Status: None   Collection Time: 02/23/20  5:48 AM   Specimen: Nasopharyngeal  Result Value Ref Range Status   MRSA by PCR NEGATIVE NEGATIVE Final    Comment:        The GeneXpert MRSA Assay (FDA approved for NASAL specimens only), is one component of a comprehensive MRSA colonization surveillance program. It is not intended to diagnose MRSA infection nor to guide or monitor treatment for MRSA infections. Performed at [facility], [place] [address] [place], [place] [address]          Radiology Studies: CT  Angio Abd/Pel w/ and/or w/o  Result Date: 02/22/2020 CLINICAL DATA:  GI bleeding. EXAM: CTA ABDOMEN AND PELVIS WITHOUT AND WITH CONTRAST TECHNIQUE: Multidetector CT imaging of the abdomen and pelvis was performed using the standard protocol during bolus administration of intravenous contrast. Multiplanar reconstructed images and MIPs were obtained and reviewed to evaluate the vascular anatomy. CONTRAST:  111mL OMNIPAQUE IOHEXOL 350 MG/ML SOLN COMPARISON:  None. FINDINGS: VASCULAR Aorta: There are atherosclerotic changes of the abdominal aorta without evidence for an aneurysm. Celiac: Patent without evidence of aneurysm, dissection, vasculitis or significant stenosis. SMA: Patent without evidence of aneurysm, dissection, vasculitis or significant stenosis. Renals: Both renal arteries are patent without evidence of aneurysm, dissection, vasculitis, fibromuscular dysplasia or significant stenosis. IMA: Patent without evidence of aneurysm, dissection, vasculitis or significant stenosis. Inflow: Patent without evidence of aneurysm, dissection, vasculitis or significant stenosis. Proximal Outflow: Bilateral common femoral and visualized portions of the superficial and profunda femoral arteries are patent without evidence of aneurysm, dissection, vasculitis or significant stenosis. Veins: No obvious venous abnormality within the limitations of this arterial phase study. Review of the MIP images confirms the above findings. NON-VASCULAR Lower chest: The lung bases are clear. The heart size is normal. Hepatobiliary: There is decreased hepatic attenuation suggestive of hepatic steatosis. Cholelithiasis without acute inflammation.There is no biliary ductal dilation. Pancreas: Normal contours without ductal dilatation. No peripancreatic fluid collection. Spleen: Unremarkable. Adrenals/Urinary Tract: --Adrenal glands: Unremarkable. --Right kidney/ureter: No hydronephrosis or radiopaque kidney stones. --Left kidney/ureter: No  hydronephrosis or radiopaque kidney stones. --Urinary bladder: Unremarkable. Stomach/Bowel: --Stomach/Duodenum: No hiatal hernia or other gastric abnormality. Normal duodenal course and caliber. --Small bowel: Unremarkable. --Colon: There is some mild circumferential wall thickening of the descending colon. There is no evidence for active GI bleeding within the colon. There are scattered colonic diverticula. --Appendix: Normal. Vascular/Lymphatic: Atherosclerotic calcification is present within the non-aneurysmal abdominal aorta, without hemodynamically significant stenosis. --No retroperitoneal lymphadenopathy. --No mesenteric lymphadenopathy. --No pelvic or inguinal lymphadenopathy. Reproductive: Unremarkable Other: No ascites or free air. There are bilateral fat containing inguinal hernias. The left inguinal hernia contains a loop of the sigmoid colon. The visualized portions of the sigmoid colon within this hernia are relatively unremarkable. Not all of the herniated bowel is visualized on this study. Musculoskeletal. No acute displaced fractures. IMPRESSION: 1. No evidence for active GI bleeding. 2. Mild circumferential wall thickening of the descending colon may be secondary to infectious or inflammatory colitis. 3. Bilateral fat containing inguinal hernias. The left inguinal hernia contains a loop of the sigmoid colon without evidence for obstruction. 4. Cholelithiasis without acute inflammation. 5. Hepatic steatosis. Aortic Atherosclerosis (ICD10-I70.0). Electronically Signed   By: Constance Holster M.D.   On: 02/22/2020 21:13        Scheduled Meds: . sodium chloride   Intravenous Once  . Chlorhexidine Gluconate Cloth  6 each Topical Q0600  . mouth rinse  15 mL Mouth Rinse BID  . pantoprazole (PROTONIX) IV  40 mg Intravenous Q12H   Continuous Infusions: . dextrose 5% lactated ringers    . insulin 11 mL/hr at 02/23/20 1022  . lactated ringers 125 mL/hr at 02/23/20 0142  . lactated ringers        LOS: 1 day    Time spent: 69mins    Kathie Dike, MD [facility]   If 7PM-7AM, please contact night-coverage [email or web address]  02/23/2020, 10:54 AM

## 2020-02-23 NOTE — Progress Notes (Signed)
Patient's BG 465, NP Ouma was notified.

## 2020-02-23 NOTE — Progress Notes (Signed)
Inpatient Diabetes Program Recommendations  AACE/ADA: New Consensus Statement on Inpatient Glycemic Control (2015)  Target Ranges:  Prepandial:   less than 140 mg/dL      Peak postprandial:   less than 180 mg/dL (1-2 hours)      Critically ill patients:  140 - 180 mg/dL   Results for DODD, SCHMID (MRN [id number]) as of 02/23/2020 07:13  Ref. Range 02/23/2020 04:50  Beta-Hydroxybutyric Acid Latest Ref Range: 0.05 - 0.27 mmol/L 2.02 (H)   Results for DEVANTA, DANIEL (MRN [id number]) as of 02/23/2020 11:33  Ref. Range 02/22/2020 20:04 02/23/2020 04:07 02/23/2020 04:32 02/23/2020 05:53 02/23/2020 07:26 02/23/2020 08:29 02/23/2020 09:50 02/23/2020 10:42  Glucose-Capillary Latest Ref Range: 70 - 99 mg/dL 425 (H)  9 units NOVOLOG @9 :19pm  10 units NOVOLOG @10 :43pm  20 units NOVOLOG @1 :06am 510 (HH) 481 (H)  8 units NOVOLOG @4 :41am 450 (H)  IV Insulin Drip Started 444 (H)  IV Insulin Drip 346 (H)  IV Insulin Drip 274 (H)  IV Insulin Drip 255 (H)  IV Insulin Drip    Admit with: Acute blood loss anemia with hemorrhagic shock due to Acute lower GI Bleed (Hgb drop from 13.2 to 9.9 over past 24 hours)/ Diabetic Ketoacidosis (Suspect physiologic stress response in the setting of  Acute blood loss anemia , possible inflammatory/infectious colitis and cholelithiasis  in a patient with hx of DM)  History: DM2  Home DM Meds: Glyburide 5 mg BID       Metformin 1000 mg BID  Current Orders: IV Insulin Drip    Note IV Insulin Drip started this AM  11am BMET pending  Please do not transition pt to SQ Insulin until CO2 at least 20 or higher and Anion Gap at least 12 or less    --Will follow patient during hospitalization--  Wyn Quaker RN, MSN, CDE Diabetes Coordinator Inpatient Glycemic Control Team Team Pager: [phone] (8a-5p)

## 2020-02-23 NOTE — Consult Note (Signed)
Referring Provider: ED Primary Care Physician:  Patient, No Pcp Per Primary Gastroenterologist:  Althia Forts  Reason for Consultation:  Rectal bleeding  HPI: Colton Guzman is a 60 y.o. male with history of DM type 2 presenting for consultation of rectal bleeding.  Patient states he started having rectal bleeding yesterday. He had multiple episodes where he passed frank bright red blood yesterday. He also had a large episode of rectal bleeding this morning. However, since transfer to [facility] [place], he has only had minimal bright red blood per rectum. He denies any abdominal pain. He states that around Christmas, he had diarrhea "all day" for several days. The diarrhea resolved without intervention. He denies any nausea, vomiting, dysphagia, or melena. He further denies any recent weight loss and states he has actually gained weight.  He takes 81 mg aspirin daily. Denies blood thinner or NSAID use.  Denies family history of colon cancer or gastrointestinal malignancy. He has never had an EGD or colonoscopy.  CT-A 02/22/20: There is some mild circumferential wall thickening of the descending colon. There is no evidence for active GI bleeding within the colon. There are scattered colonic diverticula.   Past Medical History:  Diagnosis Date  . Diabetes mellitus without complication ([place])   . Hypertension     History reviewed. No pertinent surgical history.  Prior to Admission medications   Medication Sig Start Date End Date Taking? Authorizing Provider  allopurinol (ZYLOPRIM) 100 MG tablet Take 100 mg by mouth daily.   Yes [provider]  amLODipine (NORVASC) 10 MG tablet Take 10 mg by mouth daily.   Yes [provider]  Ascorbic Acid (VITAMIN C) 1000 MG tablet Take 1,000 mg by mouth daily.   Yes [provider]  aspirin EC 81 MG tablet Take 81 mg by mouth daily. Swallow whole.   Yes [provider]  atorvastatin (LIPITOR) 40 MG tablet Take 40 mg by mouth daily.  01/05/20  Yes [provider]  glyBURIDE (DIABETA) 5 MG tablet Take 5 mg by mouth 2 (two) times daily with a meal.   Yes [provider]  losartan-hydrochlorothiazide (HYZAAR) 100-25 MG tablet Take 1 tablet by mouth daily. 01/05/20  Yes [provider]  Magnesium 300 MG CAPS Take 300 mg by mouth daily.   Yes [provider]  metFORMIN (GLUCOPHAGE) 1000 MG tablet Take 1,000 mg by mouth 2 (two) times daily with a meal.   Yes [provider]  metoprolol tartrate (LOPRESSOR) 25 MG tablet Take 25 mg by mouth 2 (two) times daily. 01/05/20  Yes [provider]  Multiple Vitamin (MULTIVITAMIN WITH MINERALS) TABS tablet Take 1 tablet by mouth daily.   Yes [provider]  Vitamin D, Ergocalciferol, (DRISDOL) 1.25 MG (50000 UNIT) CAPS capsule Take 50,000 Units by mouth every 7 (seven) days.   Yes [provider]    Scheduled Meds: . sodium chloride   Intravenous Once  . Chlorhexidine Gluconate Cloth  6 each Topical Q0600  . mouth rinse  15 mL Mouth Rinse BID  . pantoprazole (PROTONIX) IV  40 mg Intravenous Q12H   Continuous Infusions: . dextrose 5% lactated ringers    . insulin 15 Units/hr (02/23/20 0731)  . lactated ringers 125 mL/hr at 02/23/20 0142  . lactated ringers     PRN Meds:.acetaminophen **OR** acetaminophen, dextrose, ondansetron **OR** ondansetron (ZOFRAN) IV  Allergies as of 02/22/2020  . (No Known Allergies)    History reviewed. No pertinent family history.  Social History   Socioeconomic  History  . Marital status: Married    Spouse name: Not on file  . Number of children: Not on file  . Years of education: Not on file  . Highest education level: Not on file  Occupational History  . Not on file  Tobacco Use  . Smoking status: Never Smoker  . Smokeless tobacco: Never Used  Substance and Sexual Activity  . Alcohol use: Yes  . Drug use: Not on file  . Sexual activity: Not on file  Other Topics  Concern  . Not on file  Social History Narrative  . Not on file   Social Determinants of Health   Financial Resource Strain: Not on file  Food Insecurity: Not on file  Transportation Needs: Not on file  Physical Activity: Not on file  Stress: Not on file  Social Connections: Not on file  Intimate Partner Violence: Not on file    Review of Systems: Review of Systems  Constitutional: Negative for chills, fever and weight loss.  HENT: Negative for hearing loss and tinnitus.   Eyes: Negative for pain and redness.  Respiratory: Negative for cough and shortness of breath.   Cardiovascular: Negative for chest pain and palpitations.  Gastrointestinal: Positive for blood in stool and diarrhea. Negative for abdominal pain, constipation, heartburn, melena, nausea and vomiting.  Genitourinary: Negative for flank pain and hematuria.  Musculoskeletal: Negative for falls and joint pain.  Skin: Negative for itching and rash.  Neurological: Negative for seizures and loss of consciousness.  Endo/Heme/Allergies: Negative for polydipsia. Does not bruise/bleed easily.  Psychiatric/Behavioral: Negative for substance abuse. The patient is not nervous/anxious.      Physical Exam: Vital signs: Vitals:   02/23/20 0605 02/23/20 0620  BP: (!) 112/55 109/66  Pulse: (!) 122 (!) 120  Resp: (!) 30 (!) 25  Temp: 97.7 F (36.5 C) (!) 97.5 F (36.4 C)  SpO2: 100% 100%   Last BM Date: 02/22/20 Physical Exam Vitals reviewed.  Constitutional:      General: He is awake.  HENT:     Head: Normocephalic and atraumatic.     Nose: Nose normal. No congestion.     Mouth/Throat:     Mouth: Mucous membranes are moist.     Pharynx: Oropharynx is clear.  Eyes:     General: No scleral icterus.    Extraocular Movements: Extraocular movements intact.  Cardiovascular:     Rate and Rhythm: Regular rhythm. Tachycardia present.     Pulses: Normal pulses.  Pulmonary:     Effort: Pulmonary effort is normal. No  respiratory distress.     Breath sounds: Normal breath sounds.  Abdominal:     General: Bowel sounds are normal. There is no distension.     Palpations: Abdomen is soft. There is no mass.     Tenderness: There is no abdominal tenderness. There is no guarding or rebound.     Hernia: No hernia is present.  Musculoskeletal:        General: No swelling or tenderness.     Cervical back: Normal range of motion and neck supple.  Skin:    General: Skin is warm and dry.  Neurological:     General: No focal deficit present.     Mental Status: He is oriented to person, place, and time. He is lethargic.  Psychiatric:        Mood and Affect: Mood normal.        Behavior: Behavior normal. Behavior is cooperative.      GI:  Lab Results: Recent Labs    02/22/20 1215 02/22/20 1703 02/22/20 2104 02/23/20 0432 02/23/20 0435  WBC 15.1* 20.4*  --  19.7*  --   HGB 13.2 11.6* 8.9* 9.9* 9.8*  HCT 37.2* 32.8* 26.6* 30.6* 31.1*  PLT 212 203  --  191  --    BMET Recent Labs    02/22/20 1215 02/22/20 2107 02/23/20 0435  NA 134* 135 139  K 4.2 4.7 4.8  CL 98 104 103  CO2 21* 15* 11*  GLUCOSE 417* 457* 551*  BUN 18 18 22*  CREATININE 0.95 1.10 1.50*  CALCIUM 9.0 7.9* 8.4*   LFT Recent Labs    02/22/20 2107  PROT 6.1*  ALBUMIN 3.1*  AST 29  ALT <5  ALKPHOS 44  BILITOT 0.9   PT/INR Recent Labs    02/22/20 2104  LABPROT 16.0*  INR 1.3*     Studies/Results: CT Angio Abd/Pel w/ and/or w/o  Result Date: 02/22/2020 CLINICAL DATA:  GI bleeding. EXAM: CTA ABDOMEN AND PELVIS WITHOUT AND WITH CONTRAST TECHNIQUE: Multidetector CT imaging of the abdomen and pelvis was performed using the standard protocol during bolus administration of intravenous contrast. Multiplanar reconstructed images and MIPs were obtained and reviewed to evaluate the vascular anatomy. CONTRAST:  166mL OMNIPAQUE IOHEXOL 350 MG/ML SOLN COMPARISON:  None. FINDINGS: VASCULAR Aorta: There are atherosclerotic changes  of the abdominal aorta without evidence for an aneurysm. Celiac: Patent without evidence of aneurysm, dissection, vasculitis or significant stenosis. SMA: Patent without evidence of aneurysm, dissection, vasculitis or significant stenosis. Renals: Both renal arteries are patent without evidence of aneurysm, dissection, vasculitis, fibromuscular dysplasia or significant stenosis. IMA: Patent without evidence of aneurysm, dissection, vasculitis or significant stenosis. Inflow: Patent without evidence of aneurysm, dissection, vasculitis or significant stenosis. Proximal Outflow: Bilateral common femoral and visualized portions of the superficial and profunda femoral arteries are patent without evidence of aneurysm, dissection, vasculitis or significant stenosis. Veins: No obvious venous abnormality within the limitations of this arterial phase study. Review of the MIP images confirms the above findings. NON-VASCULAR Lower chest: The lung bases are clear. The heart size is normal. Hepatobiliary: There is decreased hepatic attenuation suggestive of hepatic steatosis. Cholelithiasis without acute inflammation.There is no biliary ductal dilation. Pancreas: Normal contours without ductal dilatation. No peripancreatic fluid collection. Spleen: Unremarkable. Adrenals/Urinary Tract: --Adrenal glands: Unremarkable. --Right kidney/ureter: No hydronephrosis or radiopaque kidney stones. --Left kidney/ureter: No hydronephrosis or radiopaque kidney stones. --Urinary bladder: Unremarkable. Stomach/Bowel: --Stomach/Duodenum: No hiatal hernia or other gastric abnormality. Normal duodenal course and caliber. --Small bowel: Unremarkable. --Colon: There is some mild circumferential wall thickening of the descending colon. There is no evidence for active GI bleeding within the colon. There are scattered colonic diverticula. --Appendix: Normal. Vascular/Lymphatic: Atherosclerotic calcification is present within the non-aneurysmal abdominal  aorta, without hemodynamically significant stenosis. --No retroperitoneal lymphadenopathy. --No mesenteric lymphadenopathy. --No pelvic or inguinal lymphadenopathy. Reproductive: Unremarkable Other: No ascites or free air. There are bilateral fat containing inguinal hernias. The left inguinal hernia contains a loop of the sigmoid colon. The visualized portions of the sigmoid colon within this hernia are relatively unremarkable. Not all of the herniated bowel is visualized on this study. Musculoskeletal. No acute displaced fractures. IMPRESSION: 1. No evidence for active GI bleeding. 2. Mild circumferential wall thickening of the descending colon may be secondary to infectious or inflammatory colitis. 3. Bilateral fat containing inguinal hernias. The left inguinal hernia contains a loop of the sigmoid colon without evidence for obstruction. 4. Cholelithiasis without acute inflammation. 5.  Hepatic steatosis. Aortic Atherosclerosis (ICD10-I70.0). Electronically Signed   By: Constance Holster M.D.   On: 02/22/2020 21:13    Impression: Painless hematochezia, possibly due to recent infectious colitis vs. diverticular bleeding -CT-A 02/22/20: There is some mild circumferential wall thickening of the descending colon. There is no evidence for active GI bleeding within the colon. There are scattered colonic diverticula. -Hgb 9.9 this morning, improved to 8.9 after 1u pRBCs. However, pt had large bloody stool this morning after Hgb was drawn and is currently receiving another 1u pRBCs.  DKA: lactic acid >11 today  AKI: Cr 1.50, increased from 1.10 yesterday   Plan: Once patient is stable from a metabolic standpoint, we will plan for colonoscopy.  We can consider starting prep tomorrow with procedure Thursday 1/6 pending patient's clinical status.  In the meantime, full liquid diet is OK from a GI standpoint.  If destabilizing bleeding occurs, consider repeat CT-A with IR consultation if positive.  Continue  to monitor H&H with transfusion as needed to maintain Hgb >7.  [facility] will follow.    LOS: 1 day   Salley Slaughter  PA-C 02/23/2020, 8:34 AM  Contact #  [phone]

## 2020-02-24 DIAGNOSIS — K922 Gastrointestinal hemorrhage, unspecified: Secondary | ICD-10-CM | POA: Diagnosis not present

## 2020-02-24 LAB — PROCALCITONIN: Procalcitonin: 0.22 ng/mL

## 2020-02-24 LAB — CBC
HCT: 25.2 % — ABNORMAL LOW (ref 39.0–52.0)
HCT: 25.2 % — ABNORMAL LOW (ref 39.0–52.0)
HCT: 26.2 % — ABNORMAL LOW (ref 39.0–52.0)
Hemoglobin: 8.9 g/dL — ABNORMAL LOW (ref 13.0–17.0)
Hemoglobin: 8.7 g/dL — ABNORMAL LOW (ref 13.0–17.0)
Hemoglobin: 8.9 g/dL — ABNORMAL LOW (ref 13.0–17.0)
MCH: 30.8 pg (ref 26.0–34.0)
MCH: 30.6 pg (ref 26.0–34.0)
MCH: 30.7 pg (ref 26.0–34.0)
MCHC: 35.3 g/dL (ref 30.0–36.0)
MCHC: 34.5 g/dL (ref 30.0–36.0)
MCHC: 34 g/dL (ref 30.0–36.0)
MCV: 87.2 fL (ref 80.0–100.0)
MCV: 88.7 fL (ref 80.0–100.0)
MCV: 90.3 fL (ref 80.0–100.0)
Platelets: 97 10*3/uL — ABNORMAL LOW (ref 150–400)
Platelets: 95 10*3/uL — ABNORMAL LOW (ref 150–400)
Platelets: 109 10*3/uL — ABNORMAL LOW (ref 150–400)
RBC: 2.89 MIL/uL — ABNORMAL LOW (ref 4.22–5.81)
RBC: 2.84 MIL/uL — ABNORMAL LOW (ref 4.22–5.81)
RBC: 2.9 MIL/uL — ABNORMAL LOW (ref 4.22–5.81)
RDW: 16.3 % — ABNORMAL HIGH (ref 11.5–15.5)
RDW: 16.9 % — ABNORMAL HIGH (ref 11.5–15.5)
RDW: 17.2 % — ABNORMAL HIGH (ref 11.5–15.5)
WBC: 19.8 10*3/uL — ABNORMAL HIGH (ref 4.0–10.5)
WBC: 17.5 10*3/uL — ABNORMAL HIGH (ref 4.0–10.5)
WBC: 19.4 10*3/uL — ABNORMAL HIGH (ref 4.0–10.5)
nRBC: 0.2 % (ref 0.0–0.2)
nRBC: 0.3 % — ABNORMAL HIGH (ref 0.0–0.2)
nRBC: 1 % — ABNORMAL HIGH (ref 0.0–0.2)

## 2020-02-24 LAB — URINALYSIS, ROUTINE W REFLEX MICROSCOPIC
Bilirubin Urine: NEGATIVE
Glucose, UA: >=500 mg/dL — AB
Ketones, ur: 20 mg/dL — AB
Leukocytes,Ua: NEGATIVE
Nitrite: NEGATIVE
Protein, ur: NEGATIVE mg/dL
Specific Gravity, Urine: 1.028 (ref 1.005–1.030)
pH: 5 (ref 5.0–8.0)

## 2020-02-24 LAB — BASIC METABOLIC PANEL
Anion gap: 10 (ref 5–15)
Anion gap: 8 (ref 5–15)
Anion gap: 9 (ref 5–15)
BUN: 17 mg/dL (ref 6–20)
BUN: 18 mg/dL (ref 6–20)
BUN: 20 mg/dL (ref 6–20)
CO2: 20 mmol/L — ABNORMAL LOW (ref 22–32)
CO2: 20 mmol/L — ABNORMAL LOW (ref 22–32)
CO2: 22 mmol/L (ref 22–32)
Calcium: 7.4 mg/dL — ABNORMAL LOW (ref 8.9–10.3)
Calcium: 7.4 mg/dL — ABNORMAL LOW (ref 8.9–10.3)
Calcium: 7.5 mg/dL — ABNORMAL LOW (ref 8.9–10.3)
Chloride: 103 mmol/L (ref 98–111)
Chloride: 105 mmol/L (ref 98–111)
Chloride: 107 mmol/L (ref 98–111)
Creatinine, Ser: 0.78 mg/dL (ref 0.61–1.24)
Creatinine, Ser: 0.81 mg/dL (ref 0.61–1.24)
Creatinine, Ser: 0.94 mg/dL (ref 0.61–1.24)
GFR, Estimated: >60 mL/min (ref >60)
GFR, Estimated: >60 mL/min (ref >60)
GFR, Estimated: >60 mL/min (ref >60)
Glucose, Bld: 206 mg/dL — ABNORMAL HIGH (ref 70–99)
Glucose, Bld: 255 mg/dL — ABNORMAL HIGH (ref 70–99)
Glucose, Bld: 334 mg/dL — ABNORMAL HIGH (ref 70–99)
Potassium: 3.5 mmol/L (ref 3.5–5.1)
Potassium: 3.7 mmol/L (ref 3.5–5.1)
Potassium: 4.4 mmol/L (ref 3.5–5.1)
Sodium: 132 mmol/L — ABNORMAL LOW (ref 135–145)
Sodium: 135 mmol/L (ref 135–145)
Sodium: 137 mmol/L (ref 135–145)

## 2020-02-24 LAB — PHOSPHORUS: Phosphorus: 1.5 mg/dL — ABNORMAL LOW (ref 2.5–4.6)

## 2020-02-24 LAB — TROPONIN I (HIGH SENSITIVITY)
Troponin I (High Sensitivity): 12216 ng/L (ref <18)
Troponin I (High Sensitivity): 15553 ng/L (ref <18)
Troponin I (High Sensitivity): 12157 ng/L (ref <18)
Troponin I (High Sensitivity): 9517 ng/L (ref <18)

## 2020-02-24 LAB — COOXEMETRY PANEL
Carboxyhemoglobin: 0.8 % (ref 0.5–1.5)
Carboxyhemoglobin: 0.7 % (ref 0.5–1.5)
Carboxyhemoglobin: 1.1 % (ref 0.5–1.5)
Methemoglobin: 1 % (ref 0.0–1.5)
Methemoglobin: 1 % (ref 0.0–1.5)
Methemoglobin: 0.7 % (ref 0.0–1.5)
O2 Saturation: 50 %
O2 Saturation: 55.7 %
O2 Saturation: 53.9 %
Total hemoglobin: 9.1 g/dL — ABNORMAL LOW (ref 12.0–16.0)
Total hemoglobin: 9.3 g/dL — ABNORMAL LOW (ref 12.0–16.0)
Total hemoglobin: 9 g/dL — ABNORMAL LOW (ref 12.0–16.0)

## 2020-02-24 LAB — GLUCOSE, CAPILLARY
Glucose-Capillary: 183 mg/dL — ABNORMAL HIGH (ref 70–99)
Glucose-Capillary: 274 mg/dL — ABNORMAL HIGH (ref 70–99)
Glucose-Capillary: 288 mg/dL — ABNORMAL HIGH (ref 70–99)
Glucose-Capillary: 302 mg/dL — ABNORMAL HIGH (ref 70–99)
Glucose-Capillary: 318 mg/dL — ABNORMAL HIGH (ref 70–99)
Glucose-Capillary: 465 mg/dL — ABNORMAL HIGH (ref 70–99)

## 2020-02-24 LAB — MAGNESIUM: Magnesium: 2.1 mg/dL (ref 1.7–2.4)

## 2020-02-24 LAB — LACTIC ACID, PLASMA
Lactic Acid, Venous: 2.1 mmol/L (ref 0.5–1.9)
Lactic Acid, Venous: 3.2 mmol/L (ref 0.5–1.9)

## 2020-02-24 MED ORDER — SODIUM CHLORIDE 0.9% FLUSH
10.0000 mL | Freq: Two times a day (BID) | INTRAVENOUS | Status: DC
  Administered 2020-02-24 – 2020-03-01 (×11): 10 mL
  Administered 2020-03-01 – 2020-03-02 (×3): 20 mL
  Administered 2020-03-04 – 2020-03-07 (×5): 10 mL

## 2020-02-24 MED ORDER — ALBUMIN HUMAN 25 % IV SOLN: 25.0000 g | Freq: Four times a day (QID) | INTRAVENOUS | Status: DC

## 2020-02-24 MED ORDER — PHENYLEPHRINE CONCENTRATED 100MG/250ML (0.4 MG/ML) INFUSION SIMPLE
0.0000 ug/min | INTRAVENOUS | Status: DC
  Administered 2020-02-24: 20 ug/min via INTRAVENOUS
  Filled 2020-02-24 (×2): qty 250

## 2020-02-24 MED ORDER — INSULIN GLARGINE 100 UNIT/ML ~~LOC~~ SOLN
20.0000 [IU] | Freq: Two times a day (BID) | SUBCUTANEOUS | Status: DC
  Administered 2020-02-24 – 2020-02-25 (×3): 20 [IU] via SUBCUTANEOUS
  Filled 2020-02-24 (×4): qty 0.2

## 2020-02-24 MED ORDER — INSULIN ASPART 100 UNIT/ML ~~LOC~~ SOLN
0.0000 [IU] | SUBCUTANEOUS | Status: DC
  Administered 2020-02-24: 11 [IU] via SUBCUTANEOUS
  Administered 2020-02-24 (×2): 15 [IU] via SUBCUTANEOUS
  Administered 2020-02-24: 11 [IU] via SUBCUTANEOUS
  Administered 2020-02-25 (×2): 3 [IU] via SUBCUTANEOUS
  Administered 2020-02-25: 4 [IU] via SUBCUTANEOUS
  Administered 2020-02-25: 3 [IU] via SUBCUTANEOUS

## 2020-02-24 MED ORDER — POTASSIUM PHOSPHATES 15 MMOLE/5ML IV SOLN
30.0000 mmol | Freq: Once | INTRAVENOUS | Status: AC
  Administered 2020-02-24: 30 mmol via INTRAVENOUS
  Filled 2020-02-24: qty 10

## 2020-02-24 MED ORDER — POTASSIUM CHLORIDE 10 MEQ/50ML IV SOLN
10.0000 meq | INTRAVENOUS | Status: AC
  Administered 2020-02-24 (×2): 10 meq via INTRAVENOUS
  Filled 2020-02-24 (×2): qty 50

## 2020-02-24 MED ORDER — PHENYLEPHRINE HCL-NACL 10-0.9 MG/250ML-% IV SOLN
0.0000 ug/min | INTRAVENOUS | Status: DC
  Administered 2020-02-24: 20 ug/min via INTRAVENOUS
  Filled 2020-02-24: qty 250

## 2020-02-24 MED ORDER — ACETAMINOPHEN 160 MG/5ML PO SOLN
650.0000 mg | Freq: Four times a day (QID) | ORAL | Status: DC | PRN
  Administered 2020-02-24 – 2020-02-25 (×6): 650 mg via ORAL
  Filled 2020-02-24 (×5): qty 20.3

## 2020-02-24 MED ORDER — CALCIUM GLUCONATE-NACL 1-0.675 GM/50ML-% IV SOLN: 1.0000 g | Freq: Once | INTRAVENOUS | Status: DC

## 2020-02-24 MED ORDER — MIDAZOLAM HCL 2 MG/2ML IJ SOLN
2.0000 mg | INTRAMUSCULAR | Status: DC | PRN
  Administered 2020-02-24 – 2020-02-27 (×11): 2 mg via INTRAVENOUS

## 2020-02-24 MED ORDER — SODIUM CHLORIDE 0.9% FLUSH
10.0000 mL | INTRAVENOUS | Status: DC | PRN
  Administered 2020-03-05 – 2020-03-07 (×2): 10 mL

## 2020-02-24 MED ORDER — CALCIUM GLUCONATE-NACL 1-0.675 GM/50ML-% IV SOLN
1.0000 g | Freq: Once | INTRAVENOUS | Status: AC
  Administered 2020-02-24: 1000 mg via INTRAVENOUS
  Filled 2020-02-24: qty 50

## 2020-02-24 MED ORDER — FENTANYL BOLUS VIA INFUSION
100.0000 ug | INTRAVENOUS | Status: DC | PRN
  Administered 2020-02-24 – 2020-03-01 (×22): 100 ug via INTRAVENOUS
  Filled 2020-02-24: qty 100

## 2020-02-24 MED FILL — Medication: Qty: 1 | Status: AC

## 2020-02-24 NOTE — Progress Notes (Signed)
eLink Physician-Brief Progress Note Patient Name: Treyshaun Keatts DOB: 1960/04/17 MRN: [id number]   Date of Service  02/24/2020  HPI/Events of Note  Phosphorus 1.5  eICU Interventions  Orders are already in for K+ Phos.        Kerry Kass Akin Yi 02/24/2020, 6:07 AM

## 2020-02-24 NOTE — Progress Notes (Signed)
1/4 @ 1953 This RN was notified by Tobias Alexander, RN, pt was having chest pain 3/10, thought to be indigestion. After Katha Cabal, RN provided sublingual nitro (@2002 ) , and completing EKG, this RN arrived bedside moments before pt had a rythm change to vtach and change in respirations. 1/4 @ 2010 Code started (see code documentation for further details) 1/4 @ 2030 ETT placed by Abbey Chatters, CRNA 1/4 @ 2217 Right side triple lumen IJ placed by Lamonte Sakai, MD

## 2020-02-24 NOTE — Progress Notes (Signed)
Patient discussed with Noe Gens.   In brief, the patient is a 60 y.o. male with a history of HTN and T2DM on daily aspirin 81mg  who presented to the ED 02/22/2020 with painless hematochezia. He was initially admitted to the floor but became more lethargic and hypotensive with continued bloody stools. On 01/04, the patient complained of chest pain and received nitro x1. Had recurrent episode and was found to be in VT on the monitor. CPR was initiated and the patient was emergently cardioverted. ET tube placed. Approximately 2 minutes prior to ROSC. Rhythm after ROSC was reportedly Afib with RVR.  Cardiology was called given concern for concern for rising troponin 15553 and VT arrest with concern for underlying ischemic etiology that has been exacerbated by acute blood loss anemia.  Agree that the patient does merit ischemic work-up when more clinically stable. Unfortunately, we are not able to heparinize or start DAPT at this time and therefore he is not currently a cath candidate. He is too hypotensive for BB or GDMT at this time. Recommend continued medical stabilization with pressor support and inotropic support if needed. Continue ongoing management of GIB. Agree with TTE. We will see the patient tomorrow. Plan discussed with CCM team.  Plan: -Continue medical stabilization of GIB and hypotension  -Once more clinically stable and able to tolerate heparin/DAPT, will need ischemic work-up with coronary angiography -Continue pressor support with levophed; can add milrinone/dobutamine if MVO2 persistently low--watch for ventricular ectopy with inotropes if added -Agree with amiodarone gtt for VT -Obtain TTE -Unable to tolerate BB/ACE/ARB due to hypotension -Keep K>4, Mg>4 -We will see tomorrow and continue to follow along  [name], MD

## 2020-02-24 NOTE — Progress Notes (Signed)
eLink Physician-Brief Progress Note Patient Name: Colton Guzman DOB: April 29, 1960 MRN: [id number]   Date of Service  02/24/2020  HPI/Events of Note  Agitation.  eICU Interventions  Plan: 1. Titrate Fentanyl IV infusion as already ordered.  2. Increase Versed to 2 mg IV Q 1 hour.      Intervention Category Major Interventions: Delirium, psychosis, severe agitation - evaluation and management  Bassem Bernasconi Eugene 02/24/2020, 10:00 PM

## 2020-02-24 NOTE — Progress Notes (Signed)
Inpatient Diabetes Program Recommendations  AACE/ADA: New Consensus Statement on Inpatient Glycemic Control (2015)  Target Ranges:  Prepandial:   less than 140 mg/dL      Peak postprandial:   less than 180 mg/dL (1-2 hours)      Critically ill patients:  140 - 180 mg/dL   Lab Results  Component Value Date   GLUCAP 302 (H) 02/24/2020   HGBA1C 10.3 (H) 02/22/2020    Review of Glycemic Control Results for Colton Guzman, Colton Guzman (MRN [id number]) as of 02/24/2020 08:10  Ref. Range 02/23/2020 22:27 02/23/2020 23:15 02/24/2020 00:24 02/24/2020 07:25  Glucose-Capillary Latest Ref Range: 70 - 99 mg/dL 224 (H) 198 (H) 183 (H) 302 (H)   Diabetes history: Type 2 DM Outpatient Diabetes medications: Glyburide 5 mg BID, Metformin 1000 mg BID Current orders for Inpatient glycemic control: Novolog 0-20 units Q4H, Lantus 20 units QD  Inpatient Diabetes Program Recommendations:    IV insulin drip rates were > 5 units/hr (total 137 units in 24 hours). Would consider increasing Lantus to 20 units BID to start now.   Thanks, Bronson Curb, MSN, RNC-OB Diabetes Coordinator [phone] (8a-5p)

## 2020-02-24 NOTE — Progress Notes (Signed)
[facility] Progress Note  Colton Guzman 60 y.o. 1960/08/14  CC:  Rectal bleeding  Subjective: Events of last night noted. Patient now intubated and sedated.  RN states patient had one episode of small-volume rectal bleeding last night. No bowel movements thus far today.  CCM note states patient had large-volume BRBPR at time of cardiac arrest last night.  ROS : Review of Systems  Unable to perform ROS: Intubated   Objective: Vital signs in last 24 hours: Vitals:   02/24/20 0757 02/24/20 0800  BP:  (!) 149/62  Pulse:  (!) 112  Resp:  13  Temp:  (!) 101.3 F (38.5 C)  SpO2: 99% 100%    Physical Exam:  General:  Intubated and sedated  Head:  Normocephalic, without obvious abnormality, atraumatic  Lungs:   Mechanical ventilation, coarse lung sounds bilaterally  Heart:  Tachycardic   Abdomen:   Soft, non-distended. No grimace with palpation. Bowel sounds active all four quadrants.  Extremities: Extremities normal, atraumatic, no  edema    Lab Results: Recent Labs    02/23/20 2107 02/24/20 0000 02/24/20 0432  NA  --  137 135  K  --  3.5 3.7  CL  --  107 105  CO2  --  22 20*  GLUCOSE  --  206* 255*  BUN  --  17 18  CREATININE  --  0.78 0.81  CALCIUM  --  7.4* 7.5*  MG 1.8  --  2.1  PHOS  --   --  1.5*   Recent Labs    02/22/20 2107 02/23/20 2049  AST 29 41  ALT <5 37  ALKPHOS 44 30*  BILITOT 0.9 0.5  PROT 6.1* 4.4*  ALBUMIN 3.1* 2.5*   Recent Labs    02/23/20 0432 02/23/20 0435 02/23/20 2049 02/24/20 0000 02/24/20 0432  WBC 19.7*   < > 16.3*  20.1* 19.8* 17.5*  NEUTROABS 14.9*  --  10.0*  --   --   HGB 9.9*   < > 9.6*  7.2*  7.4* 8.9* 8.7*  HCT 30.6*   < > 28.1*  20.6*  23.1* 25.2* 25.2*  MCV 96.8   < > 88.6  92.0 87.2 88.7  PLT 191   < > 87*  96* 97* 95*   < > = values in this interval not displayed.   Recent Labs    02/22/20 2104 02/23/20 2049  LABPROT 16.0* 17.6*  INR 1.3* 1.5*    Assessment: Painless  hematochezia, possibly due to recent infectious colitis vs. diverticular bleeding -CT-A 02/22/20: There is some mild circumferential wall thickening of the descending colon. There is no evidence for active GI bleeding within the colon. There are scattered colonic diverticula. -Hgb 8.7 this morning, improved from 7.4 last night after 1u pRBCs  Cardiac arrest s/p cardioversion and CPR, troponin 12,216 today.  Concern for demand ischemia in the setting of GI bleeding.  Acute respiratory failure, mechanically ventilated  DKA: lactic acid >11 today  AKI: Cr 1.50, increased from 1.10 yesterday   Plan: Continue supportive care.  We will defer colonoscopy at this time due to patient status.  Colonoscopic visualization would be poor without prep.  If destabilizing bleeding occurs, consider repeat CT-A with IR consultation if positive.  Continue to monitor Hgb with transfusion as needed to maintain Hgb >8.  [facility] will follow.  Salley Slaughter PA-C 02/24/2020, 8:41 AM  Contact #  [phone]

## 2020-02-24 NOTE — Progress Notes (Signed)
NAMEEldrick Guzman, MRN:  [id number], DOB:  [birth year], LOS: 2 ADMISSION DATE:  02/22/2020, CONSULTATION DATE:  02/23/20 REFERRING MD:  Dr Bonner Puna, CHIEF COMPLAINT:  DKA, lethargy   Brief History:  60 year old man with hypertension, diabetes admitted 1/3 with painless hematochezia / apparent lower GI bleeding, leukocytosis. Hgb 13.2 >> 11.6 with IVF. CT abdomen with angiography 1/3 did not identify active bleeding source. Has had several more episodes of BRBPR since arrival and admission.  On 1/4 he had an episode of flatus and then belching with chest discomfort.  He was treated with NTG.  Then developed VT arrest with 60min CPR with ROSC.  Labs consistent with evolving hyperglycemia and metabolic acidosis.  To ICU 1/4 pm.  IV access placed, Aline.  Required vasopressors, rising troponin, possible cardiogenic shock.   Past Medical History:  DM  HTN   Significant Hospital Events:  1/03 Admit  1/04 Tx to SDU with GI blood loss, hyperglycemia, metabolic acidosis.  Patient plan chest discomfort around 1745, received nitroglycerin x1.  Recurrent chest discomfort and then VT on monitor.  He was emergently cardioverted, CPR approximately 2 minutes. ET intubated by CRNA, received etomidate and succinylcholine.  Not yet on sedation. Had large-volume bright red blood per rectum at the time of arrest. 1/05 On vasopressors, climbing troponin, no further bleeding   Consults:  GI  Procedures:  ETT 1/4 >>  R IJ TLC 1/4 >>  Significant Diagnostic Tests:   CT abdomen/pelvis 1/3 >> hepatic steatosis, cholelithiasis and absence of GB or ductal abnormality, normal pancreas, normal ureters, mild circumferential thickening of the descending colon question infectious or inflammatory colitis with scattered diverticuli, no evidence of GI blood loss.  Bilateral fat-containing inguinal hernias  Micro Data:  SARS CoV2 1/3 >> negative Influenza A/B 1/3 >> negative  Blood 1/3 >>  UA 1/5 >>  Tracheal aspirate 1/5 >>    Antimicrobials:  Zosyn 1/4 >>   Interim History / Subjective:  Tmax 102.2  On vent - 60% fiO2, PEEP 5  Glucose range 302-334 I/O 1.3L UOP, +2.8L in last 24 hours RN reports pt on neo, vaso, sedation  Objective   Blood pressure (!) 88/56, pulse 84, temperature (!) 102.2 F (39 C), temperature source Bladder, resp. rate (!) 24, height 5\' 8"  (1.727 m), weight 88.2 kg, SpO2 94 %.    Vent Mode: PRVC FiO2 (%):  [60 %-100 %] 60 % Set Rate:  [24 bmp] 24 bmp Vt Set:  [550 mL] 550 mL PEEP:  [5 cmH20] 5 cmH20 Plateau Pressure:  [18 cmH20-34 cmH20] 34 cmH20   Intake/Output Summary (Last 24 hours) at 02/24/2020 1114 Last data filed at 02/24/2020 1000 Gross per 24 hour  Intake 3229.5 ml  Output 1450 ml  Net 1779.5 ml   Filed Weights   02/22/20 1130 02/22/20 1759 02/22/20 2246  Weight: 88.2 kg 88.2 kg 88.2 kg    Examination: General: critically ill appearing adult male lying in bed in NAD on vent   HEENT: MM pink/moist, ETT, anicteric, pupils equal / reactive  Neuro: sedate CV: s1s2 RRR, ST on monitor, no m/r/g PULM: mild abdominal accessory muscle use, lungs bilaterally clear anterior, diminished bases GI: soft, bsx4 active  Extremities: warm/dry, trace dependent edema  Skin: no rashes or lesions  Resolved Hospital Problem list     Assessment & Plan:   VT Arrest, in the setting of acute GI blood loss, presumed hyporperfusion.  ECG just prior and then post resuscitation with anterior ST elevations. A  Fib + RVR post resuscitation Suspected Cardiogenic Shock  -ICU monitoring  -normothermia goal, avoid fever, hypotension, hypo/hyperglycemia  -unable to anticoagulate due to bleeding  -will hold statin for now as anticipate shock liver due to arrest / hypoperfusion  -continue amiodarone  -assess ECHO -appreciate Cardiology input -changed vasopressors to levophed  -assess repeat SvO2, if remains low will consider dobutamine or milrinone  -case discussed with Cardiology, not a  candidate for acute interventions at this time due to bleeding / critically ill -trend troponin to peak  -follow up EKG in am   Acute Hypoxic Respiratory Failure and mechanical ventilation post-arrest Suspected Aspiration due to Cardiac Arrest  -PRVC 8cc/kg -wean PEEP / fiO2 for sats >92% given concern for ischemia  -follow intermittent CXR  -follow ABG   Fever  Possible etiologies include MI, aspiration post arrest, GI source -follow cultures -abx as above  Diabetes Mellitus 2 with evolving DKA/HHS Initially required insulin infusion, transitioned off 1/5  -follow glucose closely  -increase lantus to 20 units BID  -hold home metformin, glyburide -SSI, resistant scale   Anion gap metabolic acidosis.   Due to DKA and also component of this was lactic acidosis, was improved with volume resuscitation and treatment of his DKA -transition as above  -trend lactate  Acute GI bleed, appears lower based on history, hematochezia -appreciate GI evaluation  -if rebleed, will need bleeding study and possible IR for embolization  -follow CBC  -transfuse for bleeding, Hgb <8% -hold TF, consider in am 1/6 pending GI plans   Colonic thickening, possible inflammatory versus infectious colitis by CT abdomen.  Associated with leukocytosis -continue empiric zosyn for pulmonary and GI coverage  -follow cultures   Hx Hypertension -hold home hyzaar, metoprolol, amlodipine   Hyperlipidemia -hold home atorvastatin, see above    Best practice (evaluated daily)  Diet: NPO Pain/Anxiety/Delirium protocol (if indicated): Fentanyl, versed  VAP protocol (if indicated): In place  DVT prophylaxis: SCD, no anticoagulation GI prophylaxis: PPI twice daily Glucose control: SSI Mobility: BR Disposition: ICU Family: Wife updated at bedside 1/5  Goals of Care:  Last date of multidisciplinary goals of care discussion: Pending Family and staff present:  Summary of discussion:  Follow up goals of  care discussion due:  Code Status: Full code  Labs   CBC: Recent Labs  Lab 02/23/20 0432 02/23/20 0435 02/23/20 1113 02/23/20 1522 02/23/20 2049 02/24/20 0000 02/24/20 0432  WBC 19.7*  --  18.8*  --  16.3*  20.1* 19.8* 17.5*  NEUTROABS 14.9*  --   --   --  10.0*  --   --   HGB 9.9*   < > 9.4* 8.4* 9.6*  7.2*  7.4* 8.9* 8.7*  HCT 30.6*   < > 27.7* 24.3* 28.1*  20.6*  23.1* 25.2* 25.2*  MCV 96.8  --  93.9  --  88.6  92.0 87.2 88.7  PLT 191  --  125*  --  87*  96* 97* 95*   < > = values in this interval not displayed.    Basic Metabolic Panel: Recent Labs  Lab 02/23/20 0432 02/23/20 0435 02/23/20 1113 02/23/20 2049 02/23/20 2107 02/24/20 0000 02/24/20 0432  NA  --  139 137 136  138  --  137 135  K  --  4.8 3.9 3.0*  3.0*  --  3.5 3.7  CL  --  103 104 106  107  --  107 105  CO2  --  11* 22 19*  19*  --  22 20*  GLUCOSE  --  551* 269* 291*  272*  --  206* 255*  BUN  --  22* 21* 16  18  --  17 18  CREATININE  --  1.50* 1.02 0.77  0.83  --  0.78 0.81  CALCIUM  --  8.4* 8.2* 7.3*  7.3*  --  7.4* 7.5*  MG 2.1  --   --   --  1.8  --  2.1  PHOS  --   --   --   --   --   --  1.5*   GFR: Estimated Creatinine Clearance: 108.6 mL/min (by C-G formula based on SCr of 0.81 mg/dL). Recent Labs  Lab 02/23/20 0435 02/23/20 0534 02/23/20 1113 02/23/20 2049 02/24/20 0000 02/24/20 0020 02/24/20 0432  PROCALCITON 0.29  --   --   --   --   --  0.22  WBC  --   --  18.8* 16.3*  20.1* 19.8*  --  17.5*  LATICACIDVEN  --  >11.0* 5.0* 6.4*  --  2.1*  --     Liver Function Tests: Recent Labs  Lab 02/22/20 1215 02/22/20 2107 02/23/20 2049  AST 29 29 41  ALT 43 <5 37  ALKPHOS 69 44 30*  BILITOT 1.1 0.9 0.5  PROT 8.3* 6.1* 4.4*  ALBUMIN 4.1 3.1* 2.5*   Recent Labs  Lab 02/23/20 0450  LIPASE 53*   Recent Labs  Lab 02/23/20 2049  AMMONIA 29    ABG    Component Value Date/Time   PHART 7.383 02/23/2020 2129   PCO2ART 34.0 02/23/2020 2129   PO2ART 238  (H) 02/23/2020 2129   HCO3 19.8 (L) 02/23/2020 2129   ACIDBASEDEF 4.2 (H) 02/23/2020 2129   O2SAT 99.8 02/23/2020 2129     Coagulation Profile: Recent Labs  Lab 02/22/20 2104 02/23/20 2049  INR 1.3* 1.5*    Cardiac Enzymes: No results for input(s): CKTOTAL, CKMB, CKMBINDEX, TROPONINI in the last 168 hours.  HbA1C: Hgb A1c MFr Bld  Date/Time Value Ref Range Status  02/22/2020 09:04 PM 10.3 (H) 4.8 - 5.6 % Final    Comment:    (NOTE) Pre diabetes:          5.7%-6.4%  Diabetes:              >6.4%  Glycemic control for   <7.0% adults with diabetes     CBG: Recent Labs  Lab 02/23/20 2112 02/23/20 2227 02/23/20 2315 02/24/20 0024 02/24/20 0725  GLUCAP 264* 224* 198* 183* 302*    Critical care time: 80 minutes      Noe Gens, MSN, NP-C, AGACNP-BC [facility] 02/24/2020, 11:14 AM   Please see [email or web address] for pager details.

## 2020-02-24 NOTE — Progress Notes (Signed)
CRITICAL VALUE ALERT  Critical Value:  12,216  Date & Time Notied:  0515  Provider Notified: Ogan  Orders Received/Actions taken: pending

## 2020-02-24 NOTE — Progress Notes (Signed)
Cross Cover patient had v tach arrest, see code sheet and critical care provider consult note

## 2020-02-24 NOTE — Progress Notes (Signed)
eLink Physician-Brief Progress Note Patient Name: Colton Guzman DOB: 1960-12-30 MRN: [id number]   Date of Service  02/24/2020  HPI/Events of Note  Troponin increased to 12,216.  eICU Interventions  Cardiology consulted. Patient is not a safe candidate for anti coagulation or anti-platelet agents at this point. Echo this a.m. to check LV function and r/o RWMA.        Kerry Kass Quade Ramirez 02/24/2020, 5:55 AM

## 2020-02-25 ENCOUNTER — Inpatient Hospital Stay (HOSPITAL_COMMUNITY): Payer: Managed Care, Other (non HMO)

## 2020-02-25 ENCOUNTER — Inpatient Hospital Stay (HOSPITAL_COMMUNITY)
Admit: 2020-02-25 | Discharge: 2020-02-25 | Disposition: A | Discharge status: Home / Self Care | Payer: Managed Care, Other (non HMO) | Attending: Pulmonary Disease | Admitting: Pulmonary Disease

## 2020-02-25 DIAGNOSIS — I472 Ventricular tachycardia: Secondary | ICD-10-CM | POA: Diagnosis not present

## 2020-02-25 DIAGNOSIS — K922 Gastrointestinal hemorrhage, unspecified: Secondary | ICD-10-CM | POA: Diagnosis not present

## 2020-02-25 DIAGNOSIS — I214 Non-ST elevation (NSTEMI) myocardial infarction: Secondary | ICD-10-CM

## 2020-02-25 DIAGNOSIS — I340 Nonrheumatic mitral (valve) insufficiency: Secondary | ICD-10-CM | POA: Diagnosis not present

## 2020-02-25 DIAGNOSIS — I469 Cardiac arrest, cause unspecified: Secondary | ICD-10-CM

## 2020-02-25 DIAGNOSIS — I361 Nonrheumatic tricuspid (valve) insufficiency: Secondary | ICD-10-CM

## 2020-02-25 DIAGNOSIS — J9601 Acute respiratory failure with hypoxia: Secondary | ICD-10-CM

## 2020-02-25 LAB — ECHOCARDIOGRAM COMPLETE
Area-P 1/2: 0.16 cm2
Calc EF: 55.6 %
Height: 68 in
S' Lateral: 3.7 cm
Single Plane A2C EF: 61.3 %
Single Plane A4C EF: 51.5 %
Weight: 3111.13 oz

## 2020-02-25 LAB — GLUCOSE, CAPILLARY
Glucose-Capillary: 133 mg/dL — ABNORMAL HIGH (ref 70–99)
Glucose-Capillary: 140 mg/dL — ABNORMAL HIGH (ref 70–99)
Glucose-Capillary: 172 mg/dL — ABNORMAL HIGH (ref 70–99)
Glucose-Capillary: 173 mg/dL — ABNORMAL HIGH (ref 70–99)
Glucose-Capillary: 93 mg/dL (ref 70–99)
Glucose-Capillary: 94 mg/dL (ref 70–99)

## 2020-02-25 LAB — BASIC METABOLIC PANEL
Anion gap: 6 (ref 5–15)
BUN: 15 mg/dL (ref 6–20)
CO2: 23 mmol/L (ref 22–32)
Calcium: 7.6 mg/dL — ABNORMAL LOW (ref 8.9–10.3)
Chloride: 104 mmol/L (ref 98–111)
Creatinine, Ser: 0.75 mg/dL (ref 0.61–1.24)
GFR, Estimated: >60 mL/min (ref >60)
Glucose, Bld: 130 mg/dL — ABNORMAL HIGH (ref 70–99)
Potassium: 3.6 mmol/L (ref 3.5–5.1)
Sodium: 133 mmol/L — ABNORMAL LOW (ref 135–145)

## 2020-02-25 LAB — PROCALCITONIN: Procalcitonin: 0.36 ng/mL

## 2020-02-25 LAB — PHOSPHORUS
Phosphorus: 1.9 mg/dL — ABNORMAL LOW (ref 2.5–4.6)
Phosphorus: 2 mg/dL — ABNORMAL LOW (ref 2.5–4.6)
Phosphorus: 2.7 mg/dL (ref 2.5–4.6)

## 2020-02-25 LAB — COOXEMETRY PANEL
Carboxyhemoglobin: 2.6 % — ABNORMAL HIGH (ref 0.5–1.5)
Methemoglobin: 1 % (ref 0.0–1.5)
O2 Saturation: 97.6 %
Total hemoglobin: 8.7 g/dL — ABNORMAL LOW (ref 12.0–16.0)

## 2020-02-25 LAB — MAGNESIUM
Magnesium: 2 mg/dL (ref 1.7–2.4)
Magnesium: 2.1 mg/dL (ref 1.7–2.4)
Magnesium: 1.8 mg/dL (ref 1.7–2.4)

## 2020-02-25 LAB — CBC
HCT: 26 % — ABNORMAL LOW (ref 39.0–52.0)
Hemoglobin: 8.5 g/dL — ABNORMAL LOW (ref 13.0–17.0)
MCH: 29.9 pg (ref 26.0–34.0)
MCHC: 32.7 g/dL (ref 30.0–36.0)
MCV: 91.5 fL (ref 80.0–100.0)
Platelets: 88 10*3/uL — ABNORMAL LOW (ref 150–400)
RBC: 2.84 MIL/uL — ABNORMAL LOW (ref 4.22–5.81)
RDW: 17.1 % — ABNORMAL HIGH (ref 11.5–15.5)
WBC: 14 10*3/uL — ABNORMAL HIGH (ref 4.0–10.5)
nRBC: 0.5 % — ABNORMAL HIGH (ref 0.0–0.2)

## 2020-02-25 MED ORDER — PROSOURCE TF PO LIQD
45.0000 mL | Freq: Two times a day (BID) | ORAL | Status: DC
  Administered 2020-02-25: 45 mL

## 2020-02-25 MED ORDER — FUROSEMIDE 10 MG/ML IJ SOLN
20.0000 mg | Freq: Once | INTRAMUSCULAR | Status: AC
  Administered 2020-02-25: 20 mg via INTRAVENOUS
  Filled 2020-02-25: qty 2

## 2020-02-25 MED ORDER — FREE WATER
100.0000 mL | Freq: Four times a day (QID) | Status: DC
  Administered 2020-02-25 – 2020-03-01 (×18): 100 mL

## 2020-02-25 MED ORDER — ALUM & MAG HYDROXIDE-SIMETH 200-200-20 MG/5ML PO SUSP: 30.0000 mL | ORAL | Status: DC | PRN

## 2020-02-25 MED ORDER — POTASSIUM CHLORIDE 20 MEQ PO PACK
40.0000 meq | PACK | Freq: Once | ORAL | Status: AC
  Administered 2020-02-25: 40 meq
  Filled 2020-02-25: qty 2

## 2020-02-25 MED ORDER — LORAZEPAM 2 MG/ML IJ SOLN: 2.0000 mg | INTRAMUSCULAR | Status: DC | PRN

## 2020-02-25 MED ORDER — PERFLUTREN LIPID MICROSPHERE
1.0000 mL | INTRAVENOUS | Status: AC | PRN
  Administered 2020-02-25: 2 mL via INTRAVENOUS
  Filled 2020-02-25: qty 10

## 2020-02-25 MED ORDER — ACETAMINOPHEN 160 MG/5ML PO SOLN: 650.0000 mg | Freq: Four times a day (QID) | ORAL | Status: DC | PRN

## 2020-02-25 MED ORDER — PROSOURCE TF PO LIQD
45.0000 mL | Freq: Three times a day (TID) | ORAL | Status: DC
  Administered 2020-02-25 – 2020-03-01 (×13): 45 mL
  Filled 2020-02-25 (×13): qty 45

## 2020-02-25 MED ORDER — VITAL HIGH PROTEIN PO LIQD
1000.0000 mL | ORAL | Status: AC
  Administered 2020-02-25: 1000 mL

## 2020-02-25 MED ORDER — VANCOMYCIN HCL 1250 MG/250ML IV SOLN
1250.0000 mg | Freq: Two times a day (BID) | INTRAVENOUS | Status: DC
  Administered 2020-02-25 – 2020-02-27 (×5): 1250 mg via INTRAVENOUS
  Filled 2020-02-25 (×6): qty 250

## 2020-02-25 MED ORDER — VANCOMYCIN HCL IN DEXTROSE 1-5 GM/200ML-% IV SOLN
1000.0000 mg | Freq: Once | INTRAVENOUS | Status: AC
  Administered 2020-02-25: 1000 mg via INTRAVENOUS
  Filled 2020-02-25: qty 200

## 2020-02-25 MED ORDER — PIPERACILLIN-TAZOBACTAM 3.375 G IVPB
3.3750 g | Freq: Three times a day (TID) | INTRAVENOUS | Status: DC
  Administered 2020-02-25 – 2020-03-02 (×17): 3.375 g via INTRAVENOUS
  Filled 2020-02-25 (×17): qty 50

## 2020-02-25 MED ORDER — ONDANSETRON HCL 4 MG/2ML IJ SOLN: 4.0000 mg | Freq: Four times a day (QID) | INTRAMUSCULAR | Status: DC | PRN

## 2020-02-25 MED ORDER — HEPARIN (PORCINE) 25000 UT/250ML-% IV SOLN
1000.0000 [IU]/h | INTRAVENOUS | Status: DC
  Administered 2020-02-25: 1000 [IU]/h via INTRAVENOUS

## 2020-02-25 MED ORDER — LEVETIRACETAM IN NACL 500 MG/100ML IV SOLN
500.0000 mg | Freq: Two times a day (BID) | INTRAVENOUS | Status: DC
  Administered 2020-02-26 – 2020-03-03 (×13): 500 mg via INTRAVENOUS
  Filled 2020-02-25 (×15): qty 100

## 2020-02-25 MED ORDER — HEPARIN (PORCINE) 25000 UT/250ML-% IV SOLN
1000.0000 [IU]/h | INTRAVENOUS | Status: DC
  Filled 2020-02-25: qty 250

## 2020-02-25 MED ORDER — OSMOLITE 1.5 CAL PO LIQD
1000.0000 mL | ORAL | Status: DC
  Administered 2020-02-26: 1000 mL
  Filled 2020-02-25 (×8): qty 1000

## 2020-02-25 MED ORDER — POTASSIUM PHOSPHATES 15 MMOLE/5ML IV SOLN
30.0000 mmol | Freq: Once | INTRAVENOUS | Status: AC
  Administered 2020-02-25: 30 mmol via INTRAVENOUS
  Filled 2020-02-25: qty 10

## 2020-02-25 MED ORDER — DOBUTAMINE IN D5W 4-5 MG/ML-% IV SOLN
2.5000 ug/kg/min | INTRAVENOUS | Status: DC
  Administered 2020-02-25: 2.5 ug/kg/min via INTRAVENOUS
  Filled 2020-02-25: qty 250

## 2020-02-25 MED ORDER — INSULIN ASPART 100 UNIT/ML ~~LOC~~ SOLN
3.0000 [IU] | SUBCUTANEOUS | Status: DC
  Administered 2020-02-25 (×2): 3 [IU] via SUBCUTANEOUS

## 2020-02-25 MED ORDER — ONDANSETRON HCL 4 MG PO TABS: 4.0000 mg | ORAL_TABLET | Freq: Four times a day (QID) | ORAL | Status: DC | PRN

## 2020-02-25 MED ORDER — SODIUM CHLORIDE 0.9 % IV SOLN
2000.0000 mg | Freq: Once | INTRAVENOUS | Status: AC
  Administered 2020-02-25: 2000 mg via INTRAVENOUS
  Filled 2020-02-25: qty 20

## 2020-02-25 NOTE — Progress Notes (Signed)
Initial Nutrition Assessment  DOCUMENTATION CODES:   Not applicable  INTERVENTION:  - will change TF order: Osmolite 1.5 @ 30 ml/hr to advance by 10 ml every 8 hours to reach goal rate of 60 ml/hr with 45 ml Prosource TF TID and 100 ml free water QID. - at goal rate, this regimen will provide 2280 kcal, 123 grams protein, and 1497 ml free water.    NUTRITION DIAGNOSIS:   Inadequate oral intake related to inability to eat as evidenced by NPO status.  GOAL:   Patient will meet greater than or equal to 90% of their needs  MONITOR:   Vent status,TF tolerance,Labs,Weight trends  REASON FOR ASSESSMENT:   Ventilator,Consult Enteral/tube feeding initiation and management  ASSESSMENT:   60 y.o. male with medical history of HTN and type 2 DM. He presented to the ED on 1/3 following hematochezia at 0345 that AM followed by 3 additional episodes. In the ED, FOBT was positive.  Patient was intubated yesterday at 2040. OGT in place (abdominal xray report indicates gastric placement). Intubation d/t cardiac arrest, s/p cardioversion and CPR.  Order currently in place per TF protocol order: Vital High Protein @ 40 ml/hr with 45 ml Prosource TF. Vital High Protein currently hanging but not infusing.   Weight on 1/3 was 194 lb and no other weight information available in the chart. Family member at bedside but was on the phone during the entirety of RD visit.   GI following. Note from today states no further bleeding.    Patient is currently intubated on ventilator support MV: 8 L/min Temp (24hrs), Avg:102 F (38.9 C), Min:100.9 F (38.3 C), Max:103.3 F (39.6 C) Propofol: none  Labs reviewed; CBGs: 173, 133, 93 mg/dl, Na: 133 mmol/l, Ca: 7.6 mg/dl, Phos: 1.9 mg/dl. Medications reviewed; 100 mg colace BID, 20 mg IV lasix x1 dose 1/6, sliding scale novolog, 3 units novolog every 4 hours, 20 units lantus BID, 40 mg IV protonix BID, 17 g miralax/day, 40 mEq Klor-Con x1 dose 1/6, 30 mmol  IV KPhos x1 run 1/5 and x1 run 1/6. Drips; amio @ 30 mg/hr, fentanyl @ 100 mcg/hr, versed @ 2 mg/hr.     NUTRITION - FOCUSED PHYSICAL EXAM:  completed; no muscle or fat depletions, mild edema to all extremities  Diet Order:   Diet Order            Diet NPO time specified  Diet effective now                 EDUCATION NEEDS:   Not appropriate for education at this time  Skin:  Skin Assessment: Reviewed RN Assessment  Last BM:  1/4 (type 6)  Height:   Ht Readings from Last 1 Encounters:  02/23/20 5\' 8"  (1.727 m)    Weight:   Wt Readings from Last 1 Encounters:  02/22/20 88.2 kg     Estimated Nutritional Needs:  Kcal:  2267 kcal Protein:  106-132 grams Fluid:  >/= 2.2 L/day      Jarome Matin, MS, RD, LDN, CNSC Inpatient Clinical Dietitian RD pager # available in AMION  After hours/weekend pager # available in [facility]

## 2020-02-25 NOTE — Progress Notes (Signed)
eLink Physician-Brief Progress Note Patient Name: Ji Feldner DOB: 10/17/1960 MRN: [id number]   Date of Service  02/25/2020  HPI/Events of Note  Multiple issues: 1. COOX = 53.9.Hgb = 8.9 and CVP = 16 2. Temp = 102.7 F in spite of ice packs, cooling blanket and Tylenol and 3. Ventilator Asynchrony.  eICU Interventions  Plan: 1. Dobutamine 2.5 mcg/kg/min. 2. Repeat COOX at 5 AM. 3. Increase Fentanyl ceiling to 400 mcg/hour.  4. Ask respiratory to turn down temperature on ventilator heater.      Intervention Category Major Interventions: Other: Intermediate Interventions: Other:  [name] 02/25/2020, 1:49 AM

## 2020-02-25 NOTE — Progress Notes (Addendum)
NAMEElieser Guzman, MRN:  [id number], DOB:  28-Dec-1960, LOS: 3 ADMISSION DATE:  02/22/2020, CONSULTATION DATE:  02/23/20 REFERRING MD:  Dr Bonner Puna, CHIEF COMPLAINT:  DKA, lethargy   Brief History:  60 year old man with hypertension, diabetes admitted 1/3 with painless hematochezia / apparent lower GI bleeding, leukocytosis. Hgb 13.2 >> 11.6 with IVF. CT abdomen with angiography 1/3 did not identify active bleeding source. Has had several more episodes of BRBPR since arrival and admission.  On 1/4 he had an episode of flatus and then belching with chest discomfort.  He was treated with NTG.  Then developed VT arrest with 77min CPR with ROSC.  Labs consistent with evolving hyperglycemia and metabolic acidosis.  To ICU 1/4 pm.  IV access placed, Aline.  Required vasopressors, rising troponin, possible cardiogenic shock.   Past Medical History:  DM  HTN   Significant Hospital Events:  1/03 Admit  1/04 Tx to SDU with GI blood loss, hyperglycemia, metabolic acidosis.  Pt c/o chest discomfort around 1745, received nitroglycerin x1.  Recurrent chest discomfort and then VT on monitor. CPR approximately 2 minutes. ET intubated by CRNA, received etomidate and succinylcholine.  Not yet on sedation. Had large-volume bright red blood per rectum at the time of arrest. 1/05 On vasopressors, climbing troponin, no further bleeding. Improved on levophed. Cardiology consulted 1/06 PSV wean 5/5, off pressors  Consults:  GI  Procedures:  ETT 1/4 >>  R IJ TLC 1/4 >>  Significant Diagnostic Tests:   CT abdomen/pelvis 1/3 >> hepatic steatosis, cholelithiasis and absence of GB or ductal abnormality, normal pancreas, normal ureters, mild circumferential thickening of the descending colon question infectious or inflammatory colitis with scattered diverticuli, no evidence of GI blood loss.  Bilateral fat-containing inguinal hernias  Micro Data:  SARS CoV2 1/3 >> negative Influenza A/B 1/3 >> negative  BCx2 1/3 >>   UA 1/5 >> rare bacteria, glucose >500 / ketones 20, uric acid crystals present  Tracheal aspirate 1/5 >> abundant WBC >>   Antimicrobials:  Zosyn 1/4 >>   Interim History / Subjective:  Febrile, tmax 103.3 / WBC 14 Glucose range 130-173 I/O 1.2L UOP, +2.2L in last 24 hours On vent - PSV wean 5/5, 40% Versed 2mg  / Fentanyl 100 mcg    No bleeding overnight   Objective   Blood pressure 111/67, pulse (!) 119, temperature (!) 101.3 F (38.5 C), temperature source Oral, resp. rate 14, height 5\' 8"  (1.727 m), weight 88.2 kg, SpO2 95 %.    Vent Mode: PSV;CPAP FiO2 (%):  [40 %-60 %] 40 % Set Rate:  [24 bmp] 24 bmp Vt Set:  [550 mL] 550 mL PEEP:  [5 cmH20] 5 cmH20 Pressure Support:  [5 cmH20] 5 cmH20 Plateau Pressure:  [20 cmH20-36 cmH20] 36 cmH20   Intake/Output Summary (Last 24 hours) at 02/25/2020 9528 Last data filed at 02/25/2020 0800 Gross per 24 hour  Intake 2427.63 ml  Output 1230 ml  Net 1197.63 ml   Filed Weights   02/22/20 1130 02/22/20 1759 02/22/20 2246  Weight: 88.2 kg 88.2 kg 88.2 kg    Examination: General: adult male lying in bed in NAD on vent  HEENT: MM pink/moist, ETT, anicteric Neuro: sedate on versed / fentanyl  CV: s1s2 RRR, no m/r/g PULM: non-labored on PSV, lungs bilaterally with rhonchi  GI: soft, bsx4 active  Extremities: warm/dry, trace dependent edema  Skin: no rashes or lesions  CXR 1/6 >> images personally reviewed, central vascular congestion, mild edema, linear atelectasis on left,  ETT & central line in good position   Resolved Hospital Problem list     Assessment & Plan:   VT Arrest, in the setting of acute GI blood loss, presumed hyporperfusion.  ECG just prior and then post resuscitation with anterior ST elevations. A Fib + RVR post resuscitation Cardiogenic Shock CHADSVASC - 2  -ICU monitoring  -normothermia goal  -not an anticoagulation candidate currently due to GIB  -hold statin for now, follow up LFT's in am and consider  starting  -continue amiodarone  -await ECHO findings -appreciate Cardiology input  -levophed for MAP >65 as needed, weaned off 1/5 -troponin peak 15,553 -follow SvO2  -lasix 20 mg IVx1 with pulmonary edema  -will likely need diagnostic cath once stabilized -monitor electrolytes closely   Acute Hypoxic Respiratory Failure and mechanical ventilation post-arrest Suspected Aspiration due to Cardiac Arrest  Acute Pulmonary Edema  -PRVC 8cc/kg as rest mode  -wean PEEP / FiO2 for sats >90% -follow intermittent CXR -daily PSV / SBT for possible extubation  -lasix as above  Fever  Possible etiologies include MI, aspiration post arrest, GI source -follow cultures -abx as above for GI / pulm coverage   Diabetes Mellitus 2 with DKA/HHS Initially required insulin infusion, transitioned off 1/5  -CBG goal 140-180  -lantus 20 units BID  -SSI, resistant scale  -hold home metformin, glyburide  Anion gap metabolic acidosis.   Due to DKA and also component of this was lactic acidosis, was improved with volume resuscitation and treatment of his DKA -supportive care, resolved   Hypophosphatemia  -monitor, replace as indicated   Acute GI bleed, appears lower based on history, hematochezia -appreciate GI evaluation  -if rebleeds, consider bleeding study and possible IR embolization  -trend CBC closely  -transfuse for Hgb <8% with cardiac concerns  -begin TF, discussed with GI   Colonic thickening, possible inflammatory versus infectious colitis by CT abdomen.  Associated with leukocytosis -continue empiric zosyn -follow cultures to maturity   Thrombocytopenia  Fatty liver noted on CT, ? Underlying cirrhosis  -follow platelets  -no role for transfusion / monitor for bleeding  Hx Hypertension -hold home hyzaar, metoprolol, amlodipine   Hyperlipidemia -hold home atorvastatin for now   Best practice (evaluated daily)  Diet: NPO / TF  Pain/Anxiety/Delirium protocol (if indicated):  Fentanyl, versed  VAP protocol (if indicated): In place  DVT prophylaxis: SCD, no anticoagulation GI prophylaxis: PPI twice daily Glucose control: SSI Mobility: BR Disposition: ICU Family: Will update wife on arrival 1/6  Goals of Care:  Last date of multidisciplinary goals of care discussion: Pending Family and staff present:  Summary of discussion:  Follow up goals of care discussion due:  Code Status: Full code  Labs   CBC: Recent Labs  Lab 02/23/20 0432 02/23/20 0435 02/23/20 2049 02/24/20 0000 02/24/20 0432 02/24/20 1124 02/25/20 0439  WBC 19.7*   < > 16.3*  20.1* 19.8* 17.5* 19.4* 14.0*  NEUTROABS 14.9*  --  10.0*  --   --   --   --   HGB 9.9*   < > 9.6*  7.2*  7.4* 8.9* 8.7* 8.9* 8.5*  HCT 30.6*   < > 28.1*  20.6*  23.1* 25.2* 25.2* 26.2* 26.0*  MCV 96.8   < > 88.6  92.0 87.2 88.7 90.3 91.5  PLT 191   < > 87*  96* 97* 95* 109* 88*   < > = values in this interval not displayed.    Basic Metabolic Panel: Recent Labs  Lab 02/23/20  3419 02/23/20 0435 02/23/20 2049 02/23/20 2107 02/24/20 0000 02/24/20 0432 02/24/20 1124 02/25/20 0439  NA  --    < > 136  138  --  137 135 132* 133*  K  --    < > 3.0*  3.0*  --  3.5 3.7 4.4 3.6  CL  --    < > 106  107  --  107 105 103 104  CO2  --    < > 19*  19*  --  22 20* 20* 23  GLUCOSE  --    < > 291*  272*  --  206* 255* 334* 130*  BUN  --    < > 16  18  --  17 18 20 15   CREATININE  --    < > 0.77  0.83  --  0.78 0.81 0.94 0.75  CALCIUM  --    < > 7.3*  7.3*  --  7.4* 7.5* 7.4* 7.6*  MG 2.1  --   --  1.8  --  2.1  --  2.0  PHOS  --   --   --   --   --  1.5*  --  1.9*   < > = values in this interval not displayed.   GFR: Estimated Creatinine Clearance: 109.9 mL/min (by C-G formula based on SCr of 0.75 mg/dL). Recent Labs  Lab 02/23/20 0435 02/23/20 0534 02/23/20 1113 02/23/20 2049 02/24/20 0000 02/24/20 0020 02/24/20 0432 02/24/20 1124 02/24/20 1249 02/25/20 0439 02/25/20 0440  PROCALCITON  0.29  --   --   --   --   --  0.22  --   --   --  0.36  WBC  --   --  18.8* 16.3*  20.1* 19.8*  --  17.5* 19.4*  --  14.0*  --   LATICACIDVEN  --    < > 5.0* 6.4*  --  2.1*  --   --  3.2*  --   --    < > = values in this interval not displayed.    Liver Function Tests: Recent Labs  Lab 02/22/20 1215 02/22/20 2107 02/23/20 2049  AST 29 29 41  ALT 43 <5 37  ALKPHOS 69 44 30*  BILITOT 1.1 0.9 0.5  PROT 8.3* 6.1* 4.4*  ALBUMIN 4.1 3.1* 2.5*   Recent Labs  Lab 02/23/20 0450  LIPASE 53*   Recent Labs  Lab 02/23/20 2049  AMMONIA 29    ABG    Component Value Date/Time   PHART 7.383 02/23/2020 2129   PCO2ART 34.0 02/23/2020 2129   PO2ART 238 (H) 02/23/2020 2129   HCO3 19.8 (L) 02/23/2020 2129   ACIDBASEDEF 4.2 (H) 02/23/2020 2129   O2SAT 97.6 02/25/2020 0814     Coagulation Profile: Recent Labs  Lab 02/22/20 2104 02/23/20 2049  INR 1.3* 1.5*    Cardiac Enzymes: No results for input(s): CKTOTAL, CKMB, CKMBINDEX, TROPONINI in the last 168 hours.  HbA1C: Hgb A1c MFr Bld  Date/Time Value Ref Range Status  02/22/2020 09:04 PM 10.3 (H) 4.8 - 5.6 % Final    Comment:    (NOTE) Pre diabetes:          5.7%-6.4%  Diabetes:              >6.4%  Glycemic control for   <7.0% adults with diabetes     CBG: Recent Labs  Lab 02/24/20 1613 02/24/20 1931 02/25/20 0045 02/25/20 0247 02/25/20 0750  GLUCAP 288* 274*  173* 133* 93    Critical care time: 35 minutes      Noe Gens, MSN, NP-C, AGACNP-BC [facility] 02/25/2020, 9:21 AM   Please see [email or web address] for pager details.

## 2020-02-25 NOTE — Progress Notes (Signed)
  Echocardiogram 2D Echocardiogram with definity has been performed.  Colton Guzman M 02/25/2020, 12:50 PM

## 2020-02-25 NOTE — Progress Notes (Addendum)
eLink Physician-Brief Progress Note Patient Name: Colton Guzman DOB: 08-Jan-1961 MRN: [id number]   Date of Service  02/25/2020  HPI/Events of Note  Nursing reports 30 second seizure vs rigor d/t fever. Had a similar episode earlier today and was started on keppra. Head CT Scan w/o contrast at that time revealed mild brain atrophy pattern without acute intracranial abnormality. Nursing concern for possible aspiration. Patient is already on Zosyn which will cover for aspiration. Portable CXR already ordered for 5 AM.  eICU Interventions  Plan: 1. Continue present management.      Intervention Category Major Interventions: Seizures - evaluation and management  [name] 02/25/2020, 9:31 PM

## 2020-02-25 NOTE — Progress Notes (Signed)
EEG Completed; Results Pending  

## 2020-02-25 NOTE — Progress Notes (Signed)
K+ 3.6 Replaced per protocol

## 2020-02-25 NOTE — Consult Note (Addendum)
Cardiology Consultation:   Patient ID: Colton Guzman MRN: [id number]; DOB: Jun 16, 1960  Admit date: 02/22/2020 Date of Consult: 02/25/2020  Primary Care Provider: Farris Has, MD [facility] Cardiologist: New   Patient Profile:   Colton Guzman is a 60 y.o. male with a hx of HTN and DM who is being seen today for the evaluation of VF arrest and Afib at the request of Dr. Judeth Horn.   History of Present Illness:   Colton Guzman has no prior cardiac hx. Admitted 02/22/20 for painless hematochezia. He hs intermittent rectal bleeding since admission. Seen by GI felt likely infectious colitis and plan for colonoscopy after resolution of DKA. On 1/4 evening, pt had episode of chest pain episode with bloody BM. CP resolved with SL nitro x 1. However had recurrently episode of chest pain but went into VT arrest. 2-5 minutes of CPR and shock. Post resuscitation rhythm was afib. Hs-troponin peaked at 15553 then treaded down. Treated with pressors for possible cardiogenic shock. Given concern for ischemic etiology of his VF arrest, cardiology is asked for evaluation. Converted to sinus yesterday. Rhythm looks sinus tachy with aberrancy on monitor.   K 3.6 Scr 0.75 Hgb 8.5 Pending echo  Past Medical History:  Diagnosis Date  . Diabetes mellitus without complication (HCC)   . Hypertension     Inpatient Medications: Scheduled Meds: . chlorhexidine gluconate (MEDLINE KIT)  15 mL Mouth Rinse BID  . Chlorhexidine Gluconate Cloth  6 each Topical Q0600  . docusate  100 mg Per Tube BID  . insulin aspart  0-20 Units Subcutaneous Q4H  . insulin glargine  20 Units Subcutaneous BID  . mouth rinse  15 mL Mouth Rinse 10 times per day  . pantoprazole (PROTONIX) IV  40 mg Intravenous Q12H  . polyethylene glycol  17 g Per Tube Daily  . potassium chloride  40 mEq Per Tube Once  . sodium chloride flush  10-40 mL Intracatheter Q12H   Continuous Infusions: . amiodarone 30 mg/hr (02/25/20 0800)  . DOBUTamine  2.5 mcg/kg/min (02/25/20 0800)  . fentaNYL infusion INTRAVENOUS 100 mcg/hr (02/25/20 0800)  . lactated ringers 10 mL/hr at 02/25/20 0800  . midazolam 2 mg/hr (02/25/20 0800)  . norepinephrine (LEVOPHED) Adult infusion Stopped (02/24/20 2335)  . piperacillin-tazobactam (ZOSYN)  IV Stopped (02/25/20 0235)  . vasopressin 0.03 Units/min (02/25/20 0518)   PRN Meds: acetaminophen (TYLENOL) oral liquid 160 mg/5 mL, alum & mag hydroxide-simeth, fentaNYL, midazolam, nitroGLYCERIN, ondansetron **OR** ondansetron (ZOFRAN) IV, sodium chloride flush  Allergies:   No Known Allergies  Social History:   Social History   Socioeconomic History  . Marital status: Married    Spouse name: Not on file  . Number of children: Not on file  . Years of education: Not on file  . Highest education level: Not on file  Occupational History  . Not on file  Tobacco Use  . Smoking status: Never Smoker  . Smokeless tobacco: Never Used  Substance and Sexual Activity  . Alcohol use: Yes  . Drug use: Not on file  . Sexual activity: Not on file  Other Topics Concern  . Not on file  Social History Narrative  . Not on file   Social Determinants of Health   Financial Resource Strain: Not on file  Food Insecurity: Not on file  Transportation Needs: Not on file  Physical Activity: Not on file  Stress: Not on file  Social Connections: Not on file  Intimate Partner Violence: Not on file    Family  History:   *History reviewed. No pertinent family history.   ROS:  Please see the history of present illness.  All other ROS reviewed and negative.     Physical Exam/Data:   Vitals:   02/25/20 0630 02/25/20 0700 02/25/20 0745 02/25/20 0747  BP: (!) 108/91 (!) 115/54  111/67  Pulse: (!) 121 (!) 120  (!) 119  Resp: _0 Temp:    (!) 101.3 F (38.5 C)  TempSrc:    Oral  SpO2: 94% 94% 96% 95%  Weight:      Height:        Intake/Output Summary (Last 24 hours) at 02/25/2020 0835 Last data filed at  02/25/2020 0800 Gross per 24 hour  Intake 2427.63 ml  Output 1230 ml  Net 1197.63 ml   Last 3 Weights 02/22/2020 02/22/2020 02/22/2020  Weight (lbs) 194 lb 7.1 oz 194 lb 7.1 oz 194 lb 7.1 oz  Weight (kg) 88.2 kg 88.2 kg 88.2 kg     Body mass index is 29.57 kg/m.  General:  Ill appearing male on ventilated  HEENT: normal Lymph: no adenopathy Neck: no JVD Endocrine:  No thryomegaly Vascular: No carotid bruits; FA pulses 2+ bilaterally without bruits  Cardiac:  normal S1, S2; RRR; no murmur  Lungs:  clear to auscultation bilaterally, no wheezing, rhonchi or rales  Abd: soft, nontender, no hepatomegaly  Ext: no edema Musculoskeletal:  No deformities, BUE and BLE strength normal and equal Skin: warm and dry  Neuro:  no focal abnormalities noted, sedated  Psych:  Intubated   EKG:  The EKG was personally reviewed and demonstrates:  NSR at rate of 81 bpm Telemetry:  Telemetry was personally reviewed and demonstrates: Sinus rhythm/tachycardia with abbrancy  Relevant CV Studies: Pending echo   Laboratory Data:  High Sensitivity Troponin:   Recent Labs  Lab 02/23/20 2049 02/24/20 0432 02/24/20 1124 02/24/20 1803 02/24/20 2000  TROPONINIHS 178*  23* 12,216* 15,553* 12,157* 9,517*     Chemistry Recent Labs  Lab 02/24/20 0432 02/24/20 1124 02/25/20 0439  NA 135 132* 133*  K 3.7 4.4 3.6  CL 105 103 104  CO2 20* 20* 23  GLUCOSE 255* 334* 130*  BUN _1 CREATININE 0.81 0.94 0.75  CALCIUM 7.5* 7.4* 7.6*  GFRNONAA >60 >60 >60  ANIONGAP _2 Recent Labs  Lab 02/22/20 1215 02/22/20 2107 02/23/20 2049  PROT 8.3* 6.1* 4.4*  ALBUMIN 4.1 3.1* 2.5*  AST 29 29 41  ALT 43 <5 37  ALKPHOS 69 44 30*  BILITOT 1.1 0.9 0.5   Hematology Recent Labs  Lab 02/24/20 0432 02/24/20 1124 02/25/20 0439  WBC 17.5* 19.4* 14.0*  RBC 2.84* 2.90* 2.84*  HGB 8.7* 8.9* 8.5*  HCT 25.2* 26.2* 26.0*  MCV 88.7 90.3 91.5  MCH 30.6 30.7 29.9  MCHC 34.5 34.0 32.7  RDW 16.9* 17.2*  17.1*  PLT 95* 109* 88*   Radiology/Studies:  DG Chest 1 View  Result Date: 02/23/2020 CLINICAL DATA:  Intubated, central line placement EXAM: CHEST  1 VIEW COMPARISON:  None. FINDINGS: Single frontal view of the chest demonstrates endotracheal tube overlying tracheal air column, tip well above carina. Enteric catheter passes below diaphragm tip excluded by collimation. Right internal jugular catheter tip projects over superior vena cava. External defibrillator pads overlie the right chest. Cardiac silhouette is unremarkable. Lung volumes are diminished with crowding of the central vasculature. No airspace disease, effusion, or pneumothorax. No acute bony abnormalities. IMPRESSION:  1. Support devices as above. 2. Low lung volumes with central vascular congestion. Electronically Signed   By: Randa Ngo M.D.   On: 02/23/2020 22:42   DG Abd 1 View  Result Date: 02/23/2020 CLINICAL DATA:  Enteric catheter placement EXAM: ABDOMEN - 1 VIEW COMPARISON:  02/23/2020 FINDINGS: Frontal view of the lower chest and upper abdomen demonstrates tip and side port of an enteric catheter projecting over the gastric antrum. Endotracheal tube and right internal jugular catheter are stable since chest x-ray. Bowel gas pattern is unremarkable. Lung volumes are diminished, with crowding of the central vasculature. IMPRESSION: 1. Enteric catheter overlying gastric antrum. Electronically Signed   By: Randa Ngo M.D.   On: 02/23/2020 23:48   DG CHEST PORT 1 VIEW  Result Date: 02/25/2020 CLINICAL DATA:  Cardiac arrest, respiratory failure EXAM: PORTABLE CHEST 1 VIEW COMPARISON:  02/23/2020 FINDINGS: Endotracheal tube is seen 2.7 cm above the carina. Nasogastric tube extends into the upper abdomen beyond the margin of the examination. Right internal jugular central venous catheter tip noted at the superior cavoatrial junction. Lung volumes are small, however, pulmonary insufflation remain stable when compared to prior  examination. No pneumothorax or pleural effusion. Mild perihilar interstitial pulmonary infiltrate, likely representing mild cardiogenic failure, appears slightly progressive since prior examination. Cardiac size within normal limits. IMPRESSION: Stable support lines and tubes. Stable pulmonary hypoinflation. Progressive mild perihilar pulmonary edema, likely cardiogenic. Electronically Signed   By: Fidela Salisbury MD   On: 02/25/2020 05:23   CT Angio Abd/Pel w/ and/or w/o  Result Date: 02/22/2020 CLINICAL DATA:  GI bleeding. EXAM: CTA ABDOMEN AND PELVIS WITHOUT AND WITH CONTRAST TECHNIQUE: Multidetector CT imaging of the abdomen and pelvis was performed using the standard protocol during bolus administration of intravenous contrast. Multiplanar reconstructed images and MIPs were obtained and reviewed to evaluate the vascular anatomy. CONTRAST:  114mL OMNIPAQUE IOHEXOL 350 MG/ML SOLN COMPARISON:  None. FINDINGS: VASCULAR Aorta: There are atherosclerotic changes of the abdominal aorta without evidence for an aneurysm. Celiac: Patent without evidence of aneurysm, dissection, vasculitis or significant stenosis. SMA: Patent without evidence of aneurysm, dissection, vasculitis or significant stenosis. Renals: Both renal arteries are patent without evidence of aneurysm, dissection, vasculitis, fibromuscular dysplasia or significant stenosis. IMA: Patent without evidence of aneurysm, dissection, vasculitis or significant stenosis. Inflow: Patent without evidence of aneurysm, dissection, vasculitis or significant stenosis. Proximal Outflow: Bilateral common femoral and visualized portions of the superficial and profunda femoral arteries are patent without evidence of aneurysm, dissection, vasculitis or significant stenosis. Veins: No obvious venous abnormality within the limitations of this arterial phase study. Review of the MIP images confirms the above findings. NON-VASCULAR Lower chest: The lung bases are clear. The  heart size is normal. Hepatobiliary: There is decreased hepatic attenuation suggestive of hepatic steatosis. Cholelithiasis without acute inflammation.There is no biliary ductal dilation. Pancreas: Normal contours without ductal dilatation. No peripancreatic fluid collection. Spleen: Unremarkable. Adrenals/Urinary Tract: --Adrenal glands: Unremarkable. --Right kidney/ureter: No hydronephrosis or radiopaque kidney stones. --Left kidney/ureter: No hydronephrosis or radiopaque kidney stones. --Urinary bladder: Unremarkable. Stomach/Bowel: --Stomach/Duodenum: No hiatal hernia or other gastric abnormality. Normal duodenal course and caliber. --Small bowel: Unremarkable. --Colon: There is some mild circumferential wall thickening of the descending colon. There is no evidence for active GI bleeding within the colon. There are scattered colonic diverticula. --Appendix: Normal. Vascular/Lymphatic: Atherosclerotic calcification is present within the non-aneurysmal abdominal aorta, without hemodynamically significant stenosis. --No retroperitoneal lymphadenopathy. --No mesenteric lymphadenopathy. --No pelvic or inguinal lymphadenopathy. Reproductive: Unremarkable Other: No ascites or  free air. There are bilateral fat containing inguinal hernias. The left inguinal hernia contains a loop of the sigmoid colon. The visualized portions of the sigmoid colon within this hernia are relatively unremarkable. Not all of the herniated bowel is visualized on this study. Musculoskeletal. No acute displaced fractures. IMPRESSION: 1. No evidence for active GI bleeding. 2. Mild circumferential wall thickening of the descending colon may be secondary to infectious or inflammatory colitis. 3. Bilateral fat containing inguinal hernias. The left inguinal hernia contains a loop of the sigmoid colon without evidence for obstruction. 4. Cholelithiasis without acute inflammation. 5. Hepatic steatosis. Aortic Atherosclerosis (ICD10-I70.0).  Electronically Signed   By: Constance Holster M.D.   On: 02/22/2020 21:13     Assessment and Plan:   1. VT Arrest 2. NSTEMI 3. Possible cardiogenic shock   Had VT arrest on 1/4 in setting of ongoing GIB. ROSC after 2 minutes of CPR and cardioversion. Troponin peaked > 15K, now trending down. Had dynamic EKG changes post arrest (ST depression inferior and anterior lateral leads), resolved on last EKG. Patient needs ischemic evaluation when medically stable and need to coordinate with GI study. May need diagnostic cath. Wait echo result. Not DAPT candidate currently. No BB or ACE/ARB while needing pressors.   4. Post arrest atrial fibrillation with RVR - Converted to sinus yesterday around 8:30am - Not an anticoagulation candidate with ongoing GI bleed - Keep K > 4 and Mg > 4   CHA2DS2-VASc Score = 2  This indicates a 2.2% annual risk of stroke. The patient's score is based upon: CHF History: No HTN History: Yes Diabetes History: Yes Stroke History: No Vascular Disease History: No Age Score: 0 Gender Score: 0   5. GI bleed - Per GI  For questions or updates, please contact [facility] Please consult [email or web address] for contact info under    Jarrett Soho, PA  02/25/2020 8:35 AM   Patient seen and examined and agree with Leanor Kail, PA as detailed above.  In brief, the patient is a 60 y.o.malewith a history ofHTN and T2DM on daily aspirin $RemoveBefor'81mg'[id number]$  who presented to the ED 02/22/2020 with painless hematochezia. He was initially admitted to the floor but became more lethargic and hypotensive with continued bloody stools. On 01/04, the patient complained of chest pain and received nitro x1. Had recurrent episode and was found to be in VT on the monitor. CPR was initiated and the patient was emergently cardioverted. ET tube placed. Approximately 2 minutes prior to ROSC. Rhythm after ROSC was reportedly Afib with RVR.  Cardiology was called given concern for  concern for rising troponin 15553 and VT arrest with concern for underlying ischemic etiology that has been exacerbated by acute blood loss anemia.  Currently, patient is more stable from a GI bleed standpoint. Weaned off pressors. Remains intubated and sedated. Per GI, okay to do trial of heparin gtt without bolus. If tolerates without significant bleeding, likely plan for cath Monday.  Exam: GEN: Intubated and sedated Neck: No JVD Cardiac: RR, no murmurs appreciated  Respiratory: Coarse vent sounds GI: Soft, nontender, non-distended  MS: No edema; No deformity. Neuro:  Sedated  Plan: -Will do trial of heparin gtt without a bolus with lower PTT goal and monitor for bleeding -If tolerates heparin gtt over the weekend without recurrence of bleeding, tentative plan for cath lab on Monday -Will hold on DAPT for now while trial of heparin gtt -Continue amiodarone gtt for VT -Follow-up TTE -Keep K>4, Mg>2 -Will add GDMT  as tolerated going forward once more clinically stable -Appreciate GI and CCM recommendations  Extensive time spent with the wife discussing plan and she is amenable to heparin at this time.   Total time of encounter: 45 minutes total time of encounter, including 30 minutes spent in face-to-face patient care on the date of this encounter. This time includes coordination of care and counseling regarding above mentioned problem list. Remainder of non-face-to-face time involved reviewing chart documents/testing relevant to the patient encounter and documentation in the medical record. I have independently reviewed documentation from referring provider.   [name], MD [place]

## 2020-02-25 NOTE — Progress Notes (Signed)
Pharmacy Antibiotic Note  Colton Guzman is a 60 y.o. male admitted on 02/22/2020 with GI bleed, now s/p ACS, VT arrest, and intubation. Now with fevers and GPC in sputum. PCT remains borderline elevated. Pharmacy has been consulted for vancomycin dosing.  Plan:  Vancomycin 1000 mg IV now, then 1250 mg IV q12 hr (est AUC 456 based on SCr 0.8; Vd 0.72)  Measure vancomycin AUC at steady state as indicated  Continue Zosyn 3.375 g IV q8 hrs by 4-hr infusion  SCr q24 while on vanc + Zosyn - Daily Cmet/Bmet ordered thru 1/12  MRSA PCR negative on 1/4, but given clinical deterioration and GPCs in sputum, will not repeat PCR as it will not change clinical plan  Height: 5\' 8"  (172.7 cm) Weight: 88.2 kg (194 lb 7.1 oz) IBW/kg (Calculated) : 68.4  Temp (24hrs), Avg:101.9 F (38.8 C), Min:100.9 F (38.3 C), Max:103.3 F (39.6 C)  Recent Labs  Lab 02/23/20 0534 02/23/20 1113 02/23/20 2049 02/24/20 0000 02/24/20 0020 02/24/20 0432 02/24/20 1124 02/24/20 1249 02/25/20 0439  WBC  --  18.8* 16.3*  20.1* 19.8*  --  17.5* 19.4*  --  14.0*  CREATININE  --  1.02 0.77  0.83 0.78  --  0.81 0.94  --  0.75  LATICACIDVEN >11.0* 5.0* 6.4*  --  2.1*  --   --  3.2*  --     Estimated Creatinine Clearance: 109.9 mL/min (by C-G formula based on SCr of 0.75 mg/dL).    No Known Allergies  Antimicrobials this admission: 1/4 Unasyn >> 1/5 zosyn >> 1/6 vanc >>   Dose adjustments this admission: n/a  Microbiology results: 1/4 BCx: ngtd 1/4 MRSA pcr: neg 1/5 Resp culture: rare GPC  Thank you for allowing pharmacy to be a part of this patient's care.  Colton Guzman A 02/25/2020 3:30 PM

## 2020-02-25 NOTE — Progress Notes (Addendum)
Pharmacy - IV heparin  Assessment:    Please see note from Bonne Dolores, PharmD earlier today for full details. Briefly, 60 y.o. male ordered IV for ACS in setting of lower GI bleed.    Conservatively-dosed heparin intended to start earlier this afternoon but held d/t seizure activity pending head CT  Head CT normal, OK to start heparin per CCM  Plan:   Start heparin at 1000 units/hr at 1830 today; no bolus - target lower end of therapeutic range (0.3-0.5 units/mL)  Check 6 hr heparin level  Daily CBC  Reuel Boom, PharmD, BCPS [phone] 02/25/2020, 6:17 PM

## 2020-02-25 NOTE — Progress Notes (Signed)
ANTICOAGULATION CONSULT NOTE - Initial Consult  Pharmacy Consult for IV heparin  Indication: chest pain/ACS  No Known Allergies  Patient Measurements: Height: 5\' 8"  (172.7 cm) Weight: 88.2 kg (194 lb 7.1 oz) IBW/kg (Calculated) : 68.4 Heparin Dosing Weight:   Vital Signs: Temp: 101.8 F (38.8 C) (01/06 1200) Temp Source: Oral (01/06 0747) BP: 99/60 (01/06 1200) Pulse Rate: 101 (01/06 1200)  Labs: Recent Labs    02/22/20 2104 02/22/20 2107 02/23/20 2049 02/24/20 0000 02/24/20 0432 02/24/20 1124 02/24/20 1803 02/24/20 2000 02/25/20 0439  HGB 8.9*   < > 9.6*  7.2*  7.4*   < > 8.7* 8.9*  --   --  8.5*  HCT 26.6*   < > 28.1*  20.6*  23.1*   < > 25.2* 26.2*  --   --  26.0*  PLT  --    < > 87*  96*   < > 95* 109*  --   --  88*  APTT  --   --  34  --   --   --   --   --   --   LABPROT 16.0*  --  17.6*  --   --   --   --   --   --   INR 1.3*  --  1.5*  --   --   --   --   --   --   CREATININE  --    < > 0.77  0.83   < > 0.81 0.94  --   --  0.75  TROPONINIHS  --    < > 178*  23*  --  12,216* 15,553* 12,157* 9,517*  --    < > = values in this interval not displayed.    Estimated Creatinine Clearance: 109.9 mL/min (by C-G formula based on SCr of 0.75 mg/dL).   Medical History: Past Medical History:  Diagnosis Date  . Diabetes mellitus without complication ([place])   . Hypertension     Medications:  Scheduled:  . chlorhexidine gluconate (MEDLINE KIT)  15 mL Mouth Rinse BID  . Chlorhexidine Gluconate Cloth  6 each Topical Q0600  . docusate  100 mg Per Tube BID  . feeding supplement (PROSource TF)  45 mL Per Tube TID  . free water  100 mL Per Tube Q6H  . insulin aspart  0-20 Units Subcutaneous Q4H  . insulin aspart  3 Units Subcutaneous Q4H  . insulin glargine  20 Units Subcutaneous BID  . mouth rinse  15 mL Mouth Rinse 10 times per day  . pantoprazole (PROTONIX) IV  40 mg Intravenous Q12H  . polyethylene glycol  17 g Per Tube Daily  . sodium chloride flush   10-40 mL Intracatheter Q12H    Assessment: Pharmacy is consulted to dose heparin in 60 yo male diagnsoed with ACS. Pt was admitted with GI bleed and subsequently developed VT with CPR. Troponins elevated. Per GI, anticoagulation is ok ( heparin drip ) as there has been no GI bleeding episodes in last 24 hours. Heparin drip started by cardiology with instructions:   " NO BOLUS. Lower PTT goal. Would aim on lower side of starting dose due to very high risk of bleed."   Confirmed with Dr. Johney Frame to target lower HL goal of 0.3 to 0.5.   Today, 02/25/20  Scr 0.75 mg/dl  Hgb 8.5 has been low but stable  Plt 88 low but stable    Goal of Therapy:  Heparin level 0.3-0.5  units/ml Monitor platelets by anticoagulation protocol: Yes   Plan:   No bolus as per consult  Heparin 1000 units/hr   Obtain HL 6 hours after start of infusion   Daily CBC   Closely monitor for signs and symptoms of bleeding   Royetta Asal, PharmD, BCPS 02/25/2020 1:38 PM

## 2020-02-25 NOTE — Progress Notes (Signed)
Wife updated on events of afternoon.  She has multiple concerns - states the cooling blanket needs to be removed due to his shivering.  I explained that he is likely having rigors from the fever.  She states we are giving him too much sedation and has concerns because his father died in his sleep.  She states all the medicines we are giving him are interacting and he will not get well because of them. I reassured her that the medications he is receiving are necessary and we are here to take good care of him and get him home. She states "I am not upset at you, I am upset at the situation". Support offered.  Will update wife on information as it is completed.    Noe Gens, MSN, NP-C, AGACNP-BC [facility] 02/25/2020, 3:04 PM   Please see [email or web address] for pager details.

## 2020-02-25 NOTE — Progress Notes (Cosign Needed Addendum)
Called to bedside for concern for seizure.  RN describes generalized shivering type activity with arching of back and extension of lower extremities - posturing type activity. Question if this was seizure vs rigors.  Order given for 4mg  versed bolus via pump as he is on a versed gtt. Activity had aborted on arrival to room.  He continues to shiver while at bedside. Noted temp of 101.8 on cooling blanket. Vitals stable otherwise.     Plan: Keppra 2gm load now, followed by keppra 500 mg BID STAT CT head to r/o acute pathology  Hold heparin gtt until CT head reviewed EEG now Will follow closely.    Colton Gens, MSN, NP-C, AGACNP-BC [facility] 02/25/2020, 2:07 PM   Please see [email or web address] for pager details.

## 2020-02-25 NOTE — Progress Notes (Addendum)
[facility] Gastroenterology Progress Note  Colton Guzman 60 y.o. 1960/09/18  CC:  Rectal bleeding  Subjective: Patient remains intubated and sedated.  Per flow sheet, no bowel movements or rectal bleeding overnight or today.  ROS : Review of Systems  Unable to perform ROS: Intubated   Objective: Vital signs in last 24 hours: Vitals:   02/25/20 0745 02/25/20 0747  BP:  111/67  Pulse:  (!) 119  Resp:  14  Temp:  (!) 101.3 F (38.5 C)  SpO2: 96% 95%    Physical Exam:  General:  Intubated and sedated  Head:  Normocephalic, without obvious abnormality, atraumatic  Lungs:   Mechanical ventilation, coarse lung sounds bilaterally  Heart:  Tachycardic   Abdomen:   Soft, non-distended. No grimace with palpation. Bowel sounds active all four quadrants.  Extremities: Extremities normal, atraumatic, no  Edema; SCDs in place    Lab Results: Recent Labs    02/24/20 0432 02/24/20 1124 02/25/20 0439  NA 135 132* 133*  K 3.7 4.4 3.6  CL 105 103 104  CO2 20* 20* 23  GLUCOSE 255* 334* 130*  BUN 18 20 15   CREATININE 0.81 0.94 0.75  CALCIUM 7.5* 7.4* 7.6*  MG 2.1  --  2.0  PHOS 1.5*  --  1.9*   Recent Labs    02/22/20 2107 02/23/20 2049  AST 29 41  ALT <5 37  ALKPHOS 44 30*  BILITOT 0.9 0.5  PROT 6.1* 4.4*  ALBUMIN 3.1* 2.5*   Recent Labs    02/23/20 0432 02/23/20 0435 02/23/20 2049 02/24/20 0000 02/24/20 1124 02/25/20 0439  WBC 19.7*   < > 16.3*  20.1*   < > 19.4* 14.0*  NEUTROABS 14.9*  --  10.0*  --   --   --   HGB 9.9*   < > 9.6*  7.2*  7.4*   < > 8.9* 8.5*  HCT 30.6*   < > 28.1*  20.6*  23.1*   < > 26.2* 26.0*  MCV 96.8   < > 88.6  92.0   < > 90.3 91.5  PLT 191   < > 87*  96*   < > 109* 88*   < > = values in this interval not displayed.   Recent Labs    02/22/20 2104 02/23/20 2049  LABPROT 16.0* 17.6*  INR 1.3* 1.5*    Assessment: Painless hematochezia, possibly due to recent infectious colitis vs. diverticular bleeding -Hgb 8.5, stable -CT-A  02/22/20: There is some mild circumferential wall thickening of the descending colon. There is no evidence for active GI bleeding within the colon. There are scattered colonic diverticula.  Cardiac arrest s/p cardioversion and CPR, troponin 12,216 on 1/5.  Concern for ischemia due to underlying coronary disease.  Less likely demand ischemia given troponin.  A Fib with RVR post-resuscitation  Acute respiratory failure, mechanically ventilated  Plan: Continue supportive care.  If destabilizing bleeding occurs, consider repeat CT-A with IR consultation if positive.  Continue to monitor Hgb with transfusion as needed to maintain Hgb >8.  Prognosis remains guarded.  [facility] will follow at a distance.    Salley Slaughter PA-C 02/25/2020, 9:54 AM  Contact #  [phone]

## 2020-02-26 ENCOUNTER — Inpatient Hospital Stay (HOSPITAL_COMMUNITY): Payer: Managed Care, Other (non HMO)

## 2020-02-26 DIAGNOSIS — I214 Non-ST elevation (NSTEMI) myocardial infarction: Secondary | ICD-10-CM | POA: Diagnosis not present

## 2020-02-26 DIAGNOSIS — I472 Ventricular tachycardia: Secondary | ICD-10-CM | POA: Diagnosis not present

## 2020-02-26 DIAGNOSIS — I469 Cardiac arrest, cause unspecified: Secondary | ICD-10-CM | POA: Diagnosis not present

## 2020-02-26 DIAGNOSIS — K922 Gastrointestinal hemorrhage, unspecified: Secondary | ICD-10-CM | POA: Diagnosis not present

## 2020-02-26 LAB — COMPREHENSIVE METABOLIC PANEL
ALT: 675 U/L — ABNORMAL HIGH (ref 0–44)
AST: 756 U/L — ABNORMAL HIGH (ref 15–41)
Albumin: 3.2 g/dL — ABNORMAL LOW (ref 3.5–5.0)
Alkaline Phosphatase: 54 U/L (ref 38–126)
Anion gap: 11 (ref 5–15)
BUN: 13 mg/dL (ref 6–20)
CO2: 23 mmol/L (ref 22–32)
Calcium: 7.8 mg/dL — ABNORMAL LOW (ref 8.9–10.3)
Chloride: 100 mmol/L (ref 98–111)
Creatinine, Ser: 0.77 mg/dL (ref 0.61–1.24)
GFR, Estimated: >60 mL/min (ref >60)
Glucose, Bld: 127 mg/dL — ABNORMAL HIGH (ref 70–99)
Potassium: 3.9 mmol/L (ref 3.5–5.1)
Sodium: 134 mmol/L — ABNORMAL LOW (ref 135–145)
Total Bilirubin: 1.2 mg/dL (ref 0.3–1.2)
Total Protein: 6.4 g/dL — ABNORMAL LOW (ref 6.5–8.1)

## 2020-02-26 LAB — TYPE AND SCREEN
ABO/RH(D): O POS
Antibody Screen: NEGATIVE
Unit division: 0
Unit division: 0
Unit division: 0
Unit division: 0
Unit division: 0
Unit division: 0
Unit division: 0
Unit division: 0
Unit division: 0

## 2020-02-26 LAB — GLUCOSE, CAPILLARY
Glucose-Capillary: 106 mg/dL — ABNORMAL HIGH (ref 70–99)
Glucose-Capillary: 111 mg/dL — ABNORMAL HIGH (ref 70–99)
Glucose-Capillary: 116 mg/dL — ABNORMAL HIGH (ref 70–99)
Glucose-Capillary: 159 mg/dL — ABNORMAL HIGH (ref 70–99)
Glucose-Capillary: 164 mg/dL — ABNORMAL HIGH (ref 70–99)
Glucose-Capillary: 93 mg/dL (ref 70–99)
Glucose-Capillary: 97 mg/dL (ref 70–99)

## 2020-02-26 LAB — BPAM RBC
Blood Product Expiration Date: 202202032359
Blood Product Expiration Date: 202202032359
Blood Product Expiration Date: 202202032359
Blood Product Expiration Date: 202202042359
Blood Product Expiration Date: 202202042359
Blood Product Expiration Date: 202202042359
Blood Product Expiration Date: 202202042359
Blood Product Expiration Date: 202202042359
Blood Product Expiration Date: 202202042359
ISSUE DATE / TIME: 202201032230
ISSUE DATE / TIME: 202201040559
ISSUE DATE / TIME: 202201040559
ISSUE DATE / TIME: 202201040700
ISSUE DATE / TIME: 202201040703
ISSUE DATE / TIME: 202201041304
ISSUE DATE / TIME: 202201042021
ISSUE DATE / TIME: 202201042021
ISSUE DATE / TIME: 202201051319
Unit Type and Rh: 5100
Unit Type and Rh: 5100
Unit Type and Rh: 5100
Unit Type and Rh: 5100
Unit Type and Rh: 5100
Unit Type and Rh: 5100
Unit Type and Rh: 5100
Unit Type and Rh: 5100
Unit Type and Rh: 5100

## 2020-02-26 LAB — CBC
HCT: 25.2 % — ABNORMAL LOW (ref 39.0–52.0)
HCT: 25.2 % — ABNORMAL LOW (ref 39.0–52.0)
Hemoglobin: 8.3 g/dL — ABNORMAL LOW (ref 13.0–17.0)
Hemoglobin: 8.1 g/dL — ABNORMAL LOW (ref 13.0–17.0)
MCH: 30.7 pg (ref 26.0–34.0)
MCH: 30.5 pg (ref 26.0–34.0)
MCHC: 32.9 g/dL (ref 30.0–36.0)
MCHC: 32.1 g/dL (ref 30.0–36.0)
MCV: 93.3 fL (ref 80.0–100.0)
MCV: 94.7 fL (ref 80.0–100.0)
Platelets: 118 10*3/uL — ABNORMAL LOW (ref 150–400)
Platelets: 113 10*3/uL — ABNORMAL LOW (ref 150–400)
RBC: 2.7 MIL/uL — ABNORMAL LOW (ref 4.22–5.81)
RBC: 2.66 MIL/uL — ABNORMAL LOW (ref 4.22–5.81)
RDW: 17.2 % — ABNORMAL HIGH (ref 11.5–15.5)
RDW: 17.2 % — ABNORMAL HIGH (ref 11.5–15.5)
WBC: 15.2 10*3/uL — ABNORMAL HIGH (ref 4.0–10.5)
WBC: 12.6 10*3/uL — ABNORMAL HIGH (ref 4.0–10.5)
nRBC: 0.9 % — ABNORMAL HIGH (ref 0.0–0.2)
nRBC: 1 % — ABNORMAL HIGH (ref 0.0–0.2)

## 2020-02-26 LAB — HEPARIN LEVEL (UNFRACTIONATED)
Heparin Unfractionated: 0.11 IU/mL — ABNORMAL LOW (ref 0.30–0.70)
Heparin Unfractionated: 0.29 IU/mL — ABNORMAL LOW (ref 0.30–0.70)
Heparin Unfractionated: 0.33 IU/mL (ref 0.30–0.70)

## 2020-02-26 LAB — PROCALCITONIN: Procalcitonin: 0.46 ng/mL

## 2020-02-26 LAB — PHOSPHORUS
Phosphorus: 2.5 mg/dL (ref 2.5–4.6)
Phosphorus: 2 mg/dL — ABNORMAL LOW (ref 2.5–4.6)

## 2020-02-26 LAB — MAGNESIUM
Magnesium: 1.9 mg/dL (ref 1.7–2.4)
Magnesium: 2.1 mg/dL (ref 1.7–2.4)

## 2020-02-26 LAB — HEMOGLOBIN AND HEMATOCRIT, BLOOD
HCT: 25.7 % — ABNORMAL LOW (ref 39.0–52.0)
Hemoglobin: 8.4 g/dL — ABNORMAL LOW (ref 13.0–17.0)

## 2020-02-26 MED ORDER — DEXTROSE IN LACTATED RINGERS 5 % IV SOLN: INTRAVENOUS | Status: DC

## 2020-02-26 MED ORDER — HEPARIN (PORCINE) 25000 UT/250ML-% IV SOLN
1200.0000 [IU]/h | INTRAVENOUS | Status: DC
  Administered 2020-02-26: 1200 [IU]/h via INTRAVENOUS
  Filled 2020-02-26: qty 250

## 2020-02-26 MED ORDER — METOCLOPRAMIDE HCL 5 MG/ML IJ SOLN
5.0000 mg | Freq: Once | INTRAMUSCULAR | Status: AC
  Administered 2020-02-26: 5 mg via INTRAVENOUS
  Filled 2020-02-26: qty 2

## 2020-02-26 MED ORDER — INSULIN GLARGINE 100 UNIT/ML ~~LOC~~ SOLN
10.0000 [IU] | Freq: Two times a day (BID) | SUBCUTANEOUS | Status: DC
  Administered 2020-02-26 – 2020-03-07 (×20): 10 [IU] via SUBCUTANEOUS
  Filled 2020-02-26 (×22): qty 0.1

## 2020-02-26 MED ORDER — FUROSEMIDE 10 MG/ML IJ SOLN
20.0000 mg | Freq: Once | INTRAMUSCULAR | Status: AC
  Administered 2020-02-26: 20 mg via INTRAVENOUS
  Filled 2020-02-26: qty 2

## 2020-02-26 MED ORDER — INSULIN ASPART 100 UNIT/ML ~~LOC~~ SOLN
0.0000 [IU] | SUBCUTANEOUS | Status: DC
  Administered 2020-02-26 – 2020-02-27 (×4): 2 [IU] via SUBCUTANEOUS
  Administered 2020-02-27: 1 [IU] via SUBCUTANEOUS
  Administered 2020-02-27 – 2020-02-28 (×4): 2 [IU] via SUBCUTANEOUS
  Administered 2020-02-28 (×2): 1 [IU] via SUBCUTANEOUS
  Administered 2020-02-28 – 2020-02-29 (×7): 2 [IU] via SUBCUTANEOUS
  Administered 2020-02-29: 3 [IU] via SUBCUTANEOUS
  Administered 2020-03-01: 2 [IU] via SUBCUTANEOUS
  Administered 2020-03-01: 1 [IU] via SUBCUTANEOUS
  Administered 2020-03-01 (×2): 3 [IU] via SUBCUTANEOUS
  Administered 2020-03-02 (×2): 1 [IU] via SUBCUTANEOUS
  Administered 2020-03-02: 2 [IU] via SUBCUTANEOUS
  Administered 2020-03-02: 1 [IU] via SUBCUTANEOUS
  Administered 2020-03-02: 3 [IU] via SUBCUTANEOUS
  Administered 2020-03-03: 1 [IU] via SUBCUTANEOUS
  Administered 2020-03-03: 3 [IU] via SUBCUTANEOUS
  Administered 2020-03-03: 5 [IU] via SUBCUTANEOUS
  Administered 2020-03-03 (×2): 2 [IU] via SUBCUTANEOUS
  Administered 2020-03-04: 1 [IU] via SUBCUTANEOUS
  Administered 2020-03-04: 2 [IU] via SUBCUTANEOUS
  Administered 2020-03-04: 3 [IU] via SUBCUTANEOUS
  Administered 2020-03-04: 1 [IU] via SUBCUTANEOUS
  Administered 2020-03-05: 2 [IU] via SUBCUTANEOUS
  Administered 2020-03-05: 1 [IU] via SUBCUTANEOUS
  Administered 2020-03-05: 3 [IU] via SUBCUTANEOUS
  Administered 2020-03-05: 1 [IU] via SUBCUTANEOUS
  Administered 2020-03-05: 2 [IU] via SUBCUTANEOUS
  Administered 2020-03-06 (×2): 1 [IU] via SUBCUTANEOUS
  Administered 2020-03-06 (×2): 2 [IU] via SUBCUTANEOUS
  Administered 2020-03-06: 1 [IU] via SUBCUTANEOUS
  Administered 2020-03-07: 2 [IU] via SUBCUTANEOUS
  Administered 2020-03-07: 1 [IU] via SUBCUTANEOUS
  Administered 2020-03-07: 5 [IU] via SUBCUTANEOUS
  Administered 2020-03-07: 2 [IU] via SUBCUTANEOUS
  Administered 2020-03-07: 1 [IU] via SUBCUTANEOUS
  Administered 2020-03-07 – 2020-03-08 (×2): 2 [IU] via SUBCUTANEOUS

## 2020-02-26 NOTE — Progress Notes (Addendum)
[place] for IV heparin  Indication: chest pain/ACS  No Known Allergies  Patient Measurements: Height: 5\' 8"  (172.7 cm) Weight: 88.2 kg (194 lb 7.1 oz) IBW/kg (Calculated) : 68.4 Heparin Dosing Weight:   Vital Signs: Temp: 102.2 F (39 C) (01/07 0800) Temp Source: Bladder (01/07 0800) BP: 125/65 (01/07 0800) Pulse Rate: 103 (01/07 0800)  Labs: Recent Labs    02/23/20 2049 02/24/20 0000 02/24/20 1124 02/24/20 1803 02/24/20 2000 02/25/20 0439 02/26/20 0200  HGB 9.6*  7.2*  7.4*   < > 8.9*  --   --  8.5* 8.3*  HCT 28.1*  20.6*  23.1*   < > 26.2*  --   --  26.0* 25.2*  PLT 87*  96*   < > 109*  --   --  88* 118*  APTT 34  --   --   --   --   --   --   LABPROT 17.6*  --   --   --   --   --   --   INR 1.5*  --   --   --   --   --   --   HEPARINUNFRC  --   --   --   --   --   --  0.11*  CREATININE 0.77  0.83   < > 0.94  --   --  0.75 0.77  TROPONINIHS 178*  23*   < > 15,553* 12,157* 9,517*  --   --    < > = values in this interval not displayed.    Estimated Creatinine Clearance: 109.9 mL/min (by C-G formula based on SCr of 0.77 mg/dL).   Medical History: Past Medical History:  Diagnosis Date  . Diabetes mellitus without complication ([place])   . Hypertension     Medications:  Scheduled:  . chlorhexidine gluconate (MEDLINE KIT)  15 mL Mouth Rinse BID  . Chlorhexidine Gluconate Cloth  6 each Topical Q0600  . docusate  100 mg Per Tube BID  . feeding supplement (PROSource TF)  45 mL Per Tube TID  . free water  100 mL Per Tube Q6H  . furosemide  20 mg Intravenous Once  . insulin aspart  0-9 Units Subcutaneous Q4H  . insulin glargine  10 Units Subcutaneous BID  . mouth rinse  15 mL Mouth Rinse 10 times per day  . metoCLOPramide (REGLAN) injection  5 mg Intravenous Once  . pantoprazole (PROTONIX) IV  40 mg Intravenous Q12H  . polyethylene glycol  17 g Per Tube Daily  . sodium chloride flush  10-40 mL Intracatheter Q12H     Assessment: Pharmacy is consulted to dose heparin in 60 yo male diagnosed with ACS. Pt was admitted with GI bleed and subsequently developed VT with CPR. Troponins elevated. Per GI, anticoagulation is ok ( heparin drip ) as there has been no GI bleeding episodes in last 24 hours. Heparin drip started by cardiology with instructions:   " NO BOLUS. Lower PTT goal. Would aim on lower side of starting dose due to very high risk of bleed."   Confirmed with Dr. Johney Frame to target lower HL goal of 0.3 to 0.5.   Today, 02/26/2020: - Heparin level is slightly subtherapeutic at 0.29 with heparin gtt running at 1200 units/hr - cbc somewhat stable - plan for possible cardiac cath on 1/10  Goal of Therapy:  Heparin level 0.3-0.5 units/ml Monitor platelets by anticoagulation protocol: Yes   Plan:  -  No bolus as per consult - With high risk for bleeding and heparin level slightly subtherapeutic at 0.29, will continue with current rate of 1200 units/hr  - check heparin level at 5pm. Will plan on adjusting dose if it remains subtherapeutic - will change cbc to q12h per GI recom - Closely monitor for signs and symptoms of bleeding   Dia Sitter, PharmD, BCPS 02/26/2020 10:22 AM

## 2020-02-26 NOTE — Progress Notes (Signed)
Inpatient Diabetes Program Recommendations  AACE/ADA: New Consensus Statement on Inpatient Glycemic Control (2015)  Target Ranges:  Prepandial:   less than 140 mg/dL      Peak postprandial:   less than 180 mg/dL (1-2 hours)      Critically ill patients:  140 - 180 mg/dL   Lab Results  Component Value Date   GLUCAP 97 02/26/2020   HGBA1C 10.3 (H) 02/22/2020    Review of Glycemic Control  Inpatient Diabetes Program Recommendations:    Received page from Eric Form NP regarding patient's diabetes management. Agree with plan to decrease Lantus to 10 units and change Novolog correction scale 0-9 units q 4 hrs. With tube feed coverage on hold while tube feeds are held. Will continue to follow during hospitalization.  Thank you, Nani Gasser. Ludy Messamore, RN, MSN, CDE  Diabetes Coordinator Inpatient Glycemic Control Team Team Pager [phone] (8am-5pm) 02/26/2020 9:57 AM

## 2020-02-26 NOTE — Progress Notes (Signed)
[facility] Gastroenterology Progress Note  Colton Guzman 60 y.o. [birth year]  CC:  Rectal bleeding  Subjective: Patient remains intubated and sedated.  Per care team, no rectal bleeding after Heparin was initiated.  No BM yesterday or today.  ROS : Review of Systems  Unable to perform ROS: Intubated   Objective: Vital signs in last 24 hours: Vitals:   02/26/20 0800 02/26/20 0900  BP: 125/65   Pulse: (!) 103   Resp: 11   Temp: (!) 102.2 F (39 C)   SpO2: 93% 95%    Physical Exam:  General:  Intubated and sedated  Head:  Normocephalic, without obvious abnormality, atraumatic  Lungs:   Mechanical ventilation, coarse lung sounds bilaterally  Heart:  Tachycardic   Abdomen:   Soft with mild distention. No grimace with palpation. Bowel sounds active all four quadrants.  Extremities: Extremities normal, atraumatic, no  Edema; SCDs in place    Lab Results: Recent Labs    02/25/20 0439 02/25/20 1014 02/25/20 1743 02/26/20 0200  NA 133*  --   --  134*  K 3.6  --   --  3.9  CL 104  --   --  100  CO2 23  --   --  23  GLUCOSE 130*  --   --  127*  BUN 15  --   --  13  CREATININE 0.75  --   --  0.77  CALCIUM 7.6*  --   --  7.8*  MG 2.0   < > 1.8 1.9  PHOS 1.9*   < > 2.7 2.5   < > = values in this interval not displayed.   Recent Labs    02/23/20 2049 02/26/20 0200  AST 41 756*  ALT 37 675*  ALKPHOS 30* 54  BILITOT 0.5 1.2  PROT 4.4* 6.4*  ALBUMIN 2.5* 3.2*   Recent Labs    02/23/20 2049 02/24/20 0000 02/25/20 0439 02/26/20 0200  WBC 16.3*  20.1*   < > 14.0* 15.2*  NEUTROABS 10.0*  --   --   --   HGB 9.6*  7.2*  7.4*   < > 8.5* 8.3*  HCT 28.1*  20.6*  23.1*   < > 26.0* 25.2*  MCV 88.6  92.0   < > 91.5 93.3  PLT 87*  96*   < > 88* 118*   < > = values in this interval not displayed.   Recent Labs    02/23/20 2049  LABPROT 17.6*  INR 1.5*    Assessment: Painless hematochezia, possibly due to recent infectious colitis vs. diverticular bleeding -Hgb  8.5, stable -CT-A 02/22/20: There is some mild circumferential wall thickening of the descending colon. There is no evidence for active GI bleeding within the colon. There are scattered colonic diverticula.  Cardiac arrest s/p cardioversion and CPR, troponin 12,216 on 1/5.  Concern for ischemia due to underlying coronary disease.  Less likely demand ischemia given troponin and ST changes on EKG.  Heparin initiated.  A Fib with RVR post-resuscitation  Acute respiratory failure, mechanically ventilated  Vomiting yesterday  Plan: Continue supportive care.  If vomiting persists, recommend LIWS.  Consider Miralax or stool softener if no BM and persistent distention.  Prognosis remains guarded.  [facility] will follow at a distance.    Salley Slaughter PA-C 02/26/2020, 10:53 AM  Contact #  [phone]

## 2020-02-26 NOTE — Progress Notes (Addendum)
NAMELynne Guzman, MRN:  [id number], DOB:  [birth year], LOS: 4 ADMISSION DATE:  02/22/2020, CONSULTATION DATE:  02/23/20 REFERRING MD:  Dr Bonner Puna, CHIEF COMPLAINT:  DKA, lethargy   Brief History:  60 year old man with hypertension, diabetes admitted 1/3 with painless hematochezia / apparent lower GI bleeding, leukocytosis. Hgb 13.2 >> 11.6 with IVF. CT abdomen with angiography 1/3 did not identify active bleeding source. Has had several more episodes of BRBPR since arrival and admission.  On 1/4 he had an episode of flatus and then belching with chest discomfort.  He was treated with NTG.  Then developed VT arrest with 14min CPR with ROSC.  Labs consistent with evolving hyperglycemia and metabolic acidosis.  To ICU 1/4 pm.  IV access placed, [place].  Required vasopressors, rising troponin, possible cardiogenic shock.   Past Medical History:  DM  HTN   Significant Hospital Events:  1/03 Admit  1/04 Tx to SDU with GI blood loss, hyperglycemia, metabolic acidosis.  Pt c/o chest discomfort around 1745, received nitroglycerin x1.  Recurrent chest discomfort and then VT on monitor. CPR approximately 2 minutes. ET intubated by CRNA, received etomidate and succinylcholine.  Not yet on sedation. Had large-volume bright red blood per rectum at the time of arrest. 1/05 On vasopressors, climbing troponin, no further bleeding. Improved on levophed. Cardiology consulted 1/06 PSV wean 5/5, off pressors 1/7>>   Consults:  GI  Procedures:  ETT 1/4 >>  R IJ TLC 1/4 >>  Significant Diagnostic Tests:   CT abdomen/pelvis 1/3 >> hepatic steatosis, cholelithiasis and absence of GB or ductal abnormality, normal pancreas, normal ureters, mild circumferential thickening of the descending colon question infectious or inflammatory colitis with scattered diverticuli, no evidence of GI blood loss.  Bilateral fat-containing inguinal hernias  EEG 02/25/2020 This study is suggestive of moderate to severe diffuse  encephalopathy, nonspecific etiology. No seizures or epileptiform discharges were seen throughout the recording  Echo 1/62022 EF 50-55% wall motion abnormalities suspicious for mid LAD Grade II diastolic dysfunction  (pseudonormalization). Elevated left atrial pressure moderately elevated pulmonary artery systolic  pressure. The estimated right ventricular systolic pressure is 70.6 mmHg.  LA mildly dilated Moderate MV regurgitation  Mild to moderate aortic valve sclerosis/calcification is  present, without any evidence of aortic stenosis The mid and distal anterior wall, mid and distal anterior septum, and apex  are hypokinetic   Micro Data:  SARS CoV2 1/3 >> negative Influenza A/B 1/3 >> negative  BCx2 1/3 >>  UA 1/5 >> rare bacteria, glucose >500 / ketones 20, uric acid crystals present  Tracheal aspirate 1/5 >> abundant WBC >>   Antimicrobials:  Zosyn 1/4 >>  Vanc 1.6 >>  Interim History / Subjective:  Febrile, tmax 102.9 / WBC 15.2 Glucose range 93- 173 I/O 1.2L UOP, +1.1 L in last 24 hours, Net + 6700 On vent - No weaning yet this am Versed 4 mg / Fentanyl 200 mcg    Heparin started at 1200 units Amiodarone at 30, Tachy EEG negative for seizures TF on hold for vomiting >> will adjust insulin coverage    Objective   Blood pressure 125/65, pulse (!) 103, temperature (!) 102.2 F (39 C), temperature source Bladder, resp. rate 11, height 5\' 8"  (1.727 m), weight 88.2 kg, SpO2 95 %.    Vent Mode: PRVC FiO2 (%):  [40 %-50 %] 40 % Set Rate:  [24 bmp] 24 bmp Vt Set:  [550 mL] 550 mL PEEP:  [5 cmH20] 5 cmH20 Pressure Support:  [5  [place] Pressure:  [16 WHQ75-91 cmH20] 24 cmH20   Intake/Output Summary (Last 24 hours) at 02/26/2020 0906 Last data filed at 02/26/2020 0800 Gross per 24 hour  Intake 2684.46 ml  Output 1200 ml  Net 1484.46 ml   Filed Weights   02/22/20 1759 02/22/20 2246 02/26/20 0500  Weight: 88.2 kg 88.2 kg 88.2 kg     Examination: General: adult male lying in bed in NAD sedated and on vent  HEENT: MM pink/moist, ETT, anicteric Neuro: sedated on versed / fentanyl  CV: s1s2 RRR, no m/r/g PULM: non-labored on PSV, lungs bilaterally with rhonchi , few wheezes GI: soft, bsx4 active, obese  Extremities: warm/dry, trace dependent edema  Skin: no rashes or lesions  CXR 1/7 >> images personally reviewed, Worsening bilateral perihilar airspace disease compatible with progressive edema.  Resolved Hospital Problem list     Assessment & Plan:   VT Arrest, in the setting of acute GI blood loss, presumed hyporperfusion.  ECG just prior and then post resuscitation with anterior ST elevations. A Fib + RVR post resuscitation Cardiogenic Shock CHADSVASC - 2  -ICU monitoring  -normothermia goal  - Heparin infusing at 1200 - hold statin for now, LFT's are elevated , consider starting once WNL -continue amiodarone  - ECHO findings noted -appreciate Cardiology input  - levophed for MAP >65 as needed, weaned off 1/5 - troponin peak 15,553 - follow SvO2  - Lasix 20 mg x 1 for progressive pulmonary edema on imaging - will likely need diagnostic cath once stabilized - monitor electrolytes closely   Acute Hypoxic Respiratory Failure and mechanical ventilation post-arrest Suspected Aspiration due to Cardiac Arrest  Acute Pulmonary Edema  -PRVC 8cc/kg as rest mode  -wean PEEP / FiO2 for sats >90% -follow intermittent CXR -daily PSV / SBT for possible extubation  -lasix as above  Jerking suspicious for seizure activity EEG negative for seizures - Initiated Keppra - Versed gtt - increase in sedation  Fever  Possible etiologies include MI, aspiration post arrest, GI source -follow cultures -abx as above for GI / pulm coverage - consider PICC placement once patient is afebrile x 24-48 hours   Diabetes Mellitus 2 with DKA/HHS Initially required insulin infusion, transitioned off 1/5  -CBG goal 140-180   -lantus 10 units BID while TF off -SSI, sensitive  scale  - hold home metformin, glyburide - Appreciate Diabetic Co-ordinator assistance  Anion gap metabolic acidosis.   Due to DKA and also component of this was lactic acidosis, was improved with volume resuscitation and treatment of his DKA -supportive care, resolved   Hypophosphatemia  -monitor, replace as indicated   Acute GI bleed, appears lower based on history, hematochezia ? Cirrhosis -appreciate GI evaluation  -if rebleeds, consider bleeding study and possible IR embolization  -trend CBC closely - Monitor for bleeding  -transfuse for Hgb <8% with cardiac concerns  - TF on hold for vomiting over night and concern for aspiration,  Will give an additional dose reglan ( QTc < 0.5) - Goal to get TF resumes as soon as he can tolerate - Trend LFT's  Colonic thickening, possible inflammatory versus infectious colitis by CT abdomen.  Associated with leukocytosis -continue empiric zosyn -follow cultures to maturity   Thrombocytopenia  Fatty liver noted on CT, ? Underlying cirrhosis  -follow platelets  -no role for transfusion / monitor for bleeding  Hx Hypertension -hold home hyzaar, metoprolol, amlodipine   Hyperlipidemia - hold home atorvastatin for now - Trend LFT's and resume  if normalize   Best practice (evaluated daily)  Diet: NPO / TF  Pain/Anxiety/Delirium protocol (if indicated): Fentanyl, versed  VAP protocol (if indicated): In place  DVT prophylaxis: SCD, no anticoagulation GI prophylaxis: PPI twice daily Glucose control: SSI Mobility: BR Disposition: ICU Family: Will update wife on arrival 1/6  Goals of Care:  Last date of multidisciplinary goals of care discussion: Daily updates at length with wife 1/7 Family and staff present: Wife Summary of discussion: She is pleased with current care Follow up goals of care discussion due: Daily updates Code Status: Full code  Labs   CBC: Recent Labs  Lab  02/23/20 0432 02/23/20 0435 02/23/20 2049 02/24/20 0000 02/24/20 0432 02/24/20 1124 02/25/20 0439 02/26/20 0200  WBC 19.7*   < > 16.3*  20.1* 19.8* 17.5* 19.4* 14.0* 15.2*  NEUTROABS 14.9*  --  10.0*  --   --   --   --   --   HGB 9.9*   < > 9.6*  7.2*  7.4* 8.9* 8.7* 8.9* 8.5* 8.3*  HCT 30.6*   < > 28.1*  20.6*  23.1* 25.2* 25.2* 26.2* 26.0* 25.2*  MCV 96.8   < > 88.6  92.0 87.2 88.7 90.3 91.5 93.3  PLT 191   < > 87*  96* 97* 95* 109* 88* 118*   < > = values in this interval not displayed.    Basic Metabolic Panel: Recent Labs  Lab 02/24/20 0000 02/24/20 0432 02/24/20 1124 02/25/20 0439 02/25/20 1014 02/25/20 1743 02/26/20 0200  NA 137 135 132* 133*  --   --  134*  K 3.5 3.7 4.4 3.6  --   --  3.9  CL 107 105 103 104  --   --  100  CO2 22 20* 20* 23  --   --  23  GLUCOSE 206* 255* 334* 130*  --   --  127*  BUN 17 18 20 15   --   --  13  CREATININE 0.78 0.81 0.94 0.75  --   --  0.77  CALCIUM 7.4* 7.5* 7.4* 7.6*  --   --  7.8*  MG  --  2.1  --  2.0 2.1 1.8 1.9  PHOS  --  1.5*  --  1.9* 2.0* 2.7 2.5   GFR: Estimated Creatinine Clearance: 109.9 mL/min (by C-G formula based on SCr of 0.77 mg/dL). Recent Labs  Lab 02/23/20 0435 02/23/20 0534 02/23/20 1113 02/23/20 2049 02/24/20 0000 02/24/20 0020 02/24/20 0432 02/24/20 1124 02/24/20 1249 02/25/20 0439 02/25/20 0440 02/26/20 0200  PROCALCITON 0.29  --   --   --   --   --  0.22  --   --   --  0.36 0.46  WBC  --   --  18.8* 16.3*  20.1*   < >  --  17.5* 19.4*  --  14.0*  --  15.2*  LATICACIDVEN  --    < > 5.0* 6.4*  --  2.1*  --   --  3.2*  --   --   --    < > = values in this interval not displayed.    Liver Function Tests: Recent Labs  Lab 02/22/20 1215 02/22/20 2107 02/23/20 2049 02/26/20 0200  AST 29 29 41 756*  ALT 43 <5 37 675*  ALKPHOS 69 44 30* 54  BILITOT 1.1 0.9 0.5 1.2  PROT 8.3* 6.1* 4.4* 6.4*  ALBUMIN 4.1 3.1* 2.5* 3.2*   Recent Labs  Lab 02/23/20 0450  LIPASE 53*   Recent Labs   Lab 02/23/20 2049  AMMONIA 29    ABG    Component Value Date/Time   PHART 7.383 02/23/2020 2129   PCO2ART 34.0 02/23/2020 2129   PO2ART 238 (H) 02/23/2020 2129   HCO3 19.8 (L) 02/23/2020 2129   ACIDBASEDEF 4.2 (H) 02/23/2020 2129   O2SAT 97.6 02/25/2020 0814     Coagulation Profile: Recent Labs  Lab 02/22/20 2104 02/23/20 2049  INR 1.3* 1.5*    Cardiac Enzymes: No results for input(s): CKTOTAL, CKMB, CKMBINDEX, TROPONINI in the last 168 hours.  HbA1C: Hgb A1c MFr Bld  Date/Time Value Ref Range Status  02/22/2020 09:04 PM 10.3 (H) 4.8 - 5.6 % Final    Comment:    (NOTE) Pre diabetes:          5.7%-6.4%  Diabetes:              >6.4%  Glycemic control for   <7.0% adults with diabetes     CBG: Recent Labs  Lab 02/25/20 1637 02/25/20 2000 02/26/20 0020 02/26/20 0400 02/26/20 0738  GLUCAP 140* 172* 116* 111* 97    Critical care time: 50  minutes     Magdalen Spatz, MSN, AGACNP-BC [place] for personal pager PCCM on call pager [phone] [facility] 02/26/2020, 9:06 AM   Please see [email or web address] for pager details.

## 2020-02-26 NOTE — Procedures (Signed)
Patient Name: Colton Guzman  MRN: [id number]  Epilepsy Attending: Lora Havens  Referring Physician/Provider: Noe Gens, NP Date: 02/25/2020 Duration: 23.22 mins  Patient history: 60yo M with seizure like activity. EEG to evaluate for seizure  Level of alertness:  comatose  AEDs during EEG study: LEV  Technical aspects: This EEG study was done with scalp electrodes positioned according to the 10-20 International system of electrode placement. Electrical activity was acquired at a sampling rate of 500Hz  and reviewed with a high frequency filter of 70Hz  and a low frequency filter of 1Hz . EEG data were recorded continuously and digitally stored.   Description: EEG showed continuous generalized 6-7Hz  theta slowing as well as intermittent 2-3hz  delta slowing.  Hyperventilation and photic stimulation were not performed.     ABNORMALITY -Continuous slow, generalized  IMPRESSION: This study is suggestive of moderate to severe diffuse encephalopathy, nonspecific etiology. No seizures or epileptiform discharges were seen throughout the recording.  Sai Moura Barbra Sarks

## 2020-02-26 NOTE — Progress Notes (Signed)
eLink Physician-Brief Progress Note Patient Name: Colton Guzman DOB: [birth year] MRN: [id number]   Date of Service  02/26/2020  HPI/Events of Note  AST = 756  And ALT  = 675.  eICU Interventions  Will D/C Tylenol.      Intervention Category Major Interventions: Other:  [name] 02/26/2020, 3:39 AM

## 2020-02-26 NOTE — Progress Notes (Signed)
eLink Physician-Brief Progress Note Patient Name: Colton Guzman DOB: February 11, 1961 MRN: [id number]   Date of Service  02/26/2020  HPI/Events of Note  Vomiting - Tube feeds held.   eICU Interventions  Plan: 1. OGT to LIS. 2. Portable Abdominal film STAT.      Intervention Category Major Interventions: Other:  Raeshawn Tafolla Cornelia Copa 02/26/2020, 2:25 AM

## 2020-02-26 NOTE — Progress Notes (Signed)
eLink Physician-Brief Progress Note Patient Name: Colton Guzman DOB: September 27, 1960 MRN: [id number]   Date of Service  02/26/2020  HPI/Events of Note  Fever to 102.6 F which will not break in spite of Tylenol and cooling blanket. Patient continues to vomit.   eICU Interventions  Plan: 1. Blood Cultures X 2. 2. Reglan 5 mg IV X 1 now.      Intervention Category Major Interventions: Other:  [name] 02/26/2020, 3:45 AM

## 2020-02-26 NOTE — Progress Notes (Signed)
[facility] for IV heparin  Indication: chest pain/ACS  No Known Allergies  Patient Measurements: Height: 5\' 8"  (172.7 cm) Weight: 88.2 kg (194 lb 7.1 oz) IBW/kg (Calculated) : 68.4 Heparin Dosing Weight:   Vital Signs: Temp: 102.4 F (39.1 C) (01/06 2300) Temp Source: Rectal (01/06 2300) BP: 121/75 (01/07 0245) Pulse Rate: 110 (01/07 0245)  Labs: Recent Labs    02/23/20 2049 02/24/20 0000 02/24/20 1124 02/24/20 1803 02/24/20 2000 02/25/20 0439 02/26/20 0200  HGB 9.6*  7.2*  7.4*   < > 8.9*  --   --  8.5* 8.3*  HCT 28.1*  20.6*  23.1*   < > 26.2*  --   --  26.0* 25.2*  PLT 87*  96*   < > 109*  --   --  88* 118*  APTT 34  --   --   --   --   --   --   LABPROT 17.6*  --   --   --   --   --   --   INR 1.5*  --   --   --   --   --   --   HEPARINUNFRC  --   --   --   --   --   --  0.11*  CREATININE 0.77  0.83   < > 0.94  --   --  0.75 0.77  TROPONINIHS 178*  23*   < > 15,553* 12,157* 9,517*  --   --    < > = values in this interval not displayed.    Estimated Creatinine Clearance: 109.9 mL/min (by C-G formula based on SCr of 0.77 mg/dL).   Medical History: Past Medical History:  Diagnosis Date  . Diabetes mellitus without complication ([place])   . Hypertension     Medications:  Scheduled:  . chlorhexidine gluconate (MEDLINE KIT)  15 mL Mouth Rinse BID  . Chlorhexidine Gluconate Cloth  6 each Topical Q0600  . docusate  100 mg Per Tube BID  . feeding supplement (PROSource TF)  45 mL Per Tube TID  . free water  100 mL Per Tube Q6H  . insulin aspart  0-20 Units Subcutaneous Q4H  . insulin aspart  3 Units Subcutaneous Q4H  . insulin glargine  20 Units Subcutaneous BID  . mouth rinse  15 mL Mouth Rinse 10 times per day  . pantoprazole (PROTONIX) IV  40 mg Intravenous Q12H  . polyethylene glycol  17 g Per Tube Daily  . sodium chloride flush  10-40 mL Intracatheter Q12H    Assessment: Pharmacy is consulted to dose heparin  in 60 yo male diagnsoed with ACS. Pt was admitted with GI bleed and subsequently developed VT with CPR. Troponins elevated. Per GI, anticoagulation is ok ( heparin drip ) as there has been no GI bleeding episodes in last 24 hours. Heparin drip started by cardiology with instructions:   " NO BOLUS. Lower PTT goal. Would aim on lower side of starting dose due to very high risk of bleed."   Confirmed with Dr. Johney Frame to target lower HL goal of 0.3 to 0.5.   Today, 02/26/20  Heparin level = 0.11 with heparin gtt @ 1000 units/hr  Scr 0.77 mg/dl  Hgb 8.3 has been low but stable  Plt 118 low but stable   No new bleeding complications noted per RN   Goal of Therapy:  Heparin level 0.3-0.5 units/ml Monitor platelets by anticoagulation protocol: Yes  Plan:   No bolus as per consult  Increase Heparin to 1200 units/hr (14 units/kg/hr)  Obtain HL 6 hours after infusion rate increase  Daily CBC   Closely monitor for signs and symptoms of bleeding   Leone Haven, PharmD 02/26/2020 3:05 AM

## 2020-02-26 NOTE — Progress Notes (Signed)
Pharmacy - IV heparin  Assessment:    Please see note from Dia Sitter, PharmD earlier today for full details. Briefly, 60 y.o. male ordered IV for ACS in setting of lower GI bleed. Targeting lower end of therapeutic range.   Heparin level therapeutic at 0.33 on 1200 units/hr  Some minor rectal bleeding noted by RN, CCM made aware; continuing conservatively-dosed heparin for now  Plan:   Continue heparin 1200 units/hr  Continue to target lower end of therapeutic range (0.3-0.5 units/mL)  Daily heparin level  Daily CBC  Reuel Boom, PharmD, BCPS [phone] 02/26/2020, 2:04 PM

## 2020-02-26 NOTE — Progress Notes (Addendum)
Progress Note  Patient Name: Colton Guzman Date of Encounter: 02/26/2020  [facility] Cardiologist:Dr. Johney Frame   Subjective   Remained intubated. Seizure like activity yesterday. Febrile this morning.   Inpatient Medications    Scheduled Meds: . chlorhexidine gluconate (MEDLINE KIT)  15 mL Mouth Rinse BID  . Chlorhexidine Gluconate Cloth  6 each Topical Q0600  . docusate  100 mg Per Tube BID  . feeding supplement (PROSource TF)  45 mL Per Tube TID  . free water  100 mL Per Tube Q6H  . furosemide  20 mg Intravenous Once  . insulin aspart  0-9 Units Subcutaneous Q4H  . insulin glargine  10 Units Subcutaneous BID  . mouth rinse  15 mL Mouth Rinse 10 times per day  . metoCLOPramide (REGLAN) injection  5 mg Intravenous Once  . pantoprazole (PROTONIX) IV  40 mg Intravenous Q12H  . polyethylene glycol  17 g Per Tube Daily  . sodium chloride flush  10-40 mL Intracatheter Q12H   Continuous Infusions: . amiodarone 30 mg/hr (02/26/20 1100)  . dextrose 5% lactated ringers 10 mL/hr at 02/26/20 1121  . feeding supplement (OSMOLITE 1.5 CAL)    . fentaNYL infusion INTRAVENOUS 200 mcg/hr (02/26/20 1100)  . heparin 1,200 Units/hr (02/26/20 0313)  . levETIRAcetam Stopped (02/26/20 0311)  . midazolam 4 mg/hr (02/26/20 1100)  . norepinephrine (LEVOPHED) Adult infusion Stopped (02/24/20 2335)  . piperacillin-tazobactam (ZOSYN)  IV Stopped (02/26/20 0929)  . vancomycin 166.7 mL/hr at 02/26/20 1100  . vasopressin 0.03 Units/min (02/25/20 0518)   PRN Meds: alum & mag hydroxide-simeth, fentaNYL, LORazepam, midazolam, nitroGLYCERIN, ondansetron **OR** ondansetron (ZOFRAN) IV, sodium chloride flush   Vital Signs    Vitals:   02/26/20 0745 02/26/20 0800 02/26/20 0900 02/26/20 1114  BP:  125/65    Pulse: (!) 105 (!) 103    Resp: 11 11    Temp:  (!) 102.2 F (39 C)    TempSrc:  Bladder    SpO2: 93% 93% 95% 92%  Weight:      Height:    '5\' 8"'$  (1.727 m)    Intake/Output Summary (Last  24 hours) at 02/26/2020 1136 Last data filed at 02/26/2020 1100 Gross per 24 hour  Intake 3099.56 ml  Output 1450 ml  Net 1649.56 ml   Last 3 Weights 02/26/2020 02/22/2020 02/22/2020  Weight (lbs) 194 lb 7.1 oz 194 lb 7.1 oz 194 lb 7.1 oz  Weight (kg) 88.2 kg 88.2 kg 88.2 kg      Telemetry    Sinus rhythm  - Personally Reviewed  ECG    N/A  Physical Exam   GEN: Ill appearing male on ventilator   Neck: No JVD Cardiac: RRR, no murmurs, rubs, or gallops.  Respiratory: Clear to auscultation bilaterally. GI: Soft, nontender, distended  MS: No edema; No deformity. Neuro:  Nonfocal  Psych: intubated   Labs    High Sensitivity Troponin:   Recent Labs  Lab 02/23/20 2049 02/24/20 0432 02/24/20 1124 02/24/20 1803 02/24/20 2000  TROPONINIHS 178*  23* 12,216* 15,553* 12,157* 9,517*      Chemistry Recent Labs  Lab 02/22/20 2107 02/23/20 0435 02/23/20 2049 02/24/20 0000 02/24/20 1124 02/25/20 0439 02/26/20 0200  NA 135   < > 136  138   < > 132* 133* 134*  K 4.7   < > 3.0*  3.0*   < > 4.4 3.6 3.9  CL 104   < > 106  107   < > 103 104 100  CO2 15*   < > 19*  19*   < > 20* 23 23  GLUCOSE 457*   < > 291*  272*   < > 334* 130* 127*  BUN 18   < > 16  18   < > $R'20 15 13  'iO$ CREATININE 1.10   < > 0.77  0.83   < > 0.94 0.75 0.77  CALCIUM 7.9*   < > 7.3*  7.3*   < > 7.4* 7.6* 7.8*  PROT 6.1*  --  4.4*  --   --   --  6.4*  ALBUMIN 3.1*  --  2.5*  --   --   --  3.2*  AST 29  --  41  --   --   --  756*  ALT <5  --  37  --   --   --  675*  ALKPHOS 44  --  30*  --   --   --  54  BILITOT 0.9  --  0.5  --   --   --  1.2  GFRNONAA >60   < > >60  >60   < > >60 >60 >60  ANIONGAP 16*   < > 11  12   < > $R'9 6 11   'rA$ < > = values in this interval not displayed.     Hematology Recent Labs  Lab 02/24/20 1124 02/25/20 0439 02/26/20 0200  WBC 19.4* 14.0* 15.2*  RBC 2.90* 2.84* 2.70*  HGB 8.9* 8.5* 8.3*  HCT 26.2* 26.0* 25.2*  MCV 90.3 91.5 93.3  MCH 30.7 29.9 30.7  MCHC 34.0 32.7  32.9  RDW 17.2* 17.1* 17.2*  PLT 109* 88* 118*    Radiology    CT HEAD WO CONTRAST  Result Date: 02/25/2020 CLINICAL DATA:  Mental status change, possible seizure activity EXAM: CT HEAD WITHOUT CONTRAST TECHNIQUE: Contiguous axial images were obtained from the base of the skull through the vertex without intravenous contrast. COMPARISON:  None. FINDINGS: Brain: Minor brain atrophy pattern without acute intracranial hemorrhage, mass lesion, new infarction, midline shift, herniation, or hydrocephalus. No extra-axial fluid collection. No focal mass effect or edema. Cisterns are patent. Cerebellar atrophy as well. Vascular: Intracranial atherosclerosis at the skull base. No hyperdense vessel. Skull: Normal. Negative for fracture or focal lesion. Sinuses/Orbits: No acute finding. Other: None. IMPRESSION: Mild brain atrophy pattern without acute intracranial abnormality by noncontrast CT. Electronically Signed   By: Jerilynn Mages.  Shick M.D.   On: 02/25/2020 15:21   DG Chest Port 1 View  Result Date: 02/26/2020 CLINICAL DATA:  Respiratory failure EXAM: PORTABLE CHEST 1 VIEW COMPARISON:  02/25/2020 FINDINGS: Single frontal view of the chest demonstrates endotracheal tube, tip approximately 2 cm above carina. Enteric catheter passes below diaphragm tip excluded by collimation. Right internal jugular catheter tip projects over superior vena cava. Cardiac silhouette is stable. There is progressive bilateral perihilar airspace disease. No large effusion or pneumothorax. No acute bony abnormalities. IMPRESSION: 1. Support devices as above. 2. Worsening bilateral perihilar airspace disease compatible with progressive edema. Electronically Signed   By: Randa Ngo M.D.   On: 02/26/2020 02:56   DG CHEST PORT 1 VIEW  Result Date: 02/25/2020 CLINICAL DATA:  Cardiac arrest, respiratory failure EXAM: PORTABLE CHEST 1 VIEW COMPARISON:  02/23/2020 FINDINGS: Endotracheal tube is seen 2.7 cm above the carina. Nasogastric tube  extends into the upper abdomen beyond the margin of the examination. Right internal jugular central venous catheter tip noted at the superior cavoatrial junction.  Lung volumes are small, however, pulmonary insufflation remain stable when compared to prior examination. No pneumothorax or pleural effusion. Mild perihilar interstitial pulmonary infiltrate, likely representing mild cardiogenic failure, appears slightly progressive since prior examination. Cardiac size within normal limits. IMPRESSION: Stable support lines and tubes. Stable pulmonary hypoinflation. Progressive mild perihilar pulmonary edema, likely cardiogenic. Electronically Signed   By: Fidela Salisbury MD   On: 02/25/2020 05:23   DG Abd Portable 1V  Result Date: 02/26/2020 CLINICAL DATA:  Vomiting EXAM: PORTABLE ABDOMEN - 1 VIEW COMPARISON:  02/23/2020 FINDINGS: Supine frontal view of the abdomen and pelvis excludes the bilateral flanks by collimation. Enteric catheter tip projects over the gastric antrum, stable. Minimal gas and stool throughout the colon to the level of the rectum. No bowel obstruction or ileus. No masses or abnormal calcifications. IMPRESSION: 1. Stable enteric catheter. 2. Unremarkable bowel gas pattern. Electronically Signed   By: Randa Ngo M.D.   On: 02/26/2020 02:54   EEG adult  Result Date: 02/26/2020 Lora Havens, MD     02/26/2020  8:41 AM Patient Name: Sesar Madewell MRN: [id number] Epilepsy Attending: Lora Havens Referring Physician/Provider: Noe Gens, NP Date: 02/25/2020 Duration: 23.22 mins Patient history: 60yo M with seizure like activity. EEG to evaluate for seizure Level of alertness:  comatose AEDs during EEG study: LEV Technical aspects: This EEG study was done with scalp electrodes positioned according to the 10-20 International system of electrode placement. Electrical activity was acquired at a sampling rate of 500Hz  and reviewed with a high frequency filter of 70Hz  and a low frequency filter  of 1Hz . EEG data were recorded continuously and digitally stored. Description: EEG showed continuous generalized 6-7Hz  theta slowing as well as intermittent 2-3hz  delta slowing.  Hyperventilation and photic stimulation were not performed.   ABNORMALITY -Continuous slow, generalized IMPRESSION: This study is suggestive of moderate to severe diffuse encephalopathy, nonspecific etiology. No seizures or epileptiform discharges were seen throughout the recording. Lora Havens   ECHOCARDIOGRAM COMPLETE  Result Date: 02/25/2020    ECHOCARDIOGRAM REPORT   Patient Name:   DARIL WARGA Date of Exam: 02/25/2020 Medical Rec #:  [id number]      Height:       68.0 in Accession #:    [id number]     Weight:       194.4 lb Date of Birth:  1960/06/23       BSA:          2.019 m Patient Age:    60 years       BP:           103/61 mmHg Patient Gender: M              HR:           97 bpm. Exam Location:  Inpatient Procedure: 2D Echo, Color Doppler, Cardiac Doppler and Intracardiac            Opacification Agent Indications:    Cardiac arrest I46.9  History:        Patient has no prior history of Echocardiogram examinations.                 Signs/Symptoms:Fever; Risk Factors:Hypertension, Diabetes and                 Dyslipidemia. Acute GI blood loss, presumed hyporperfusion. ECG                 just prior and then post resuscitation with anterior ST  elevations.                 A Fib + RVR post resuscitation                 Cardiogenic Shock.  Sonographer:    Darlina Sicilian RDCS Referring Phys: [id number] Donita Brooks  Sonographer Comments: Echo performed with patient supine and on artificial respirator. IMPRESSIONS  1. LVEF 50-55% with wall motion abnormalities suspicious for mid LAD. No apical thrombus.  2. Left ventricular ejection fraction, by estimation, is 50 to 55%. The left ventricle has low normal function. The left ventricle demonstrates regional wall motion abnormalities (see scoring diagram/findings for  description). Left ventricular diastolic  parameters are consistent with Grade II diastolic dysfunction (pseudonormalization). Elevated left atrial pressure.  3. Right ventricular systolic function is normal. The right ventricular size is normal. There is moderately elevated pulmonary artery systolic pressure. The estimated right ventricular systolic pressure is 04.5 mmHg.  4. Left atrial size was mildly dilated.  5. The mitral valve is normal in structure. Moderate mitral valve regurgitation. No evidence of mitral stenosis.  6. The aortic valve is normal in structure. Aortic valve regurgitation is not visualized. Mild to moderate aortic valve sclerosis/calcification is present, without any evidence of aortic stenosis.  7. The inferior vena cava is normal in size with greater than 50% respiratory variability, suggesting right atrial pressure of 3 mmHg. FINDINGS  Left Ventricle: Left ventricular ejection fraction, by estimation, is 50 to 55%. The left ventricle has low normal function. The left ventricle demonstrates regional wall motion abnormalities. Definity contrast agent was given IV to delineate the left ventricular endocardial borders. The left ventricular internal cavity size was normal in size. There is no left ventricular hypertrophy. Left ventricular diastolic parameters are consistent with Grade II diastolic dysfunction (pseudonormalization). Elevated left atrial pressure.  LV Wall Scoring: The mid and distal anterior wall, mid and distal anterior septum, and apex are hypokinetic. Right Ventricle: The right ventricular size is normal. No increase in right ventricular wall thickness. Right ventricular systolic function is normal. There is moderately elevated pulmonary artery systolic pressure. The tricuspid regurgitant velocity is 3.19 m/s, and with an assumed right atrial pressure of 8 mmHg, the estimated right ventricular systolic pressure is 40.9 mmHg. Left Atrium: Left atrial size was mildly dilated.  Right Atrium: Right atrial size was normal in size. Pericardium: There is no evidence of pericardial effusion. Mitral Valve: The mitral valve is normal in structure. Moderate mitral valve regurgitation. No evidence of mitral valve stenosis. Tricuspid Valve: The tricuspid valve is normal in structure. Tricuspid valve regurgitation is mild . No evidence of tricuspid stenosis. Aortic Valve: The aortic valve is normal in structure. Aortic valve regurgitation is not visualized. Mild to moderate aortic valve sclerosis/calcification is present, without any evidence of aortic stenosis. Pulmonic Valve: The pulmonic valve was normal in structure. Pulmonic valve regurgitation is not visualized. No evidence of pulmonic stenosis. Aorta: The aortic root is normal in size and structure. Venous: The inferior vena cava is normal in size with greater than 50% respiratory variability, suggesting right atrial pressure of 3 mmHg. IAS/Shunts: No atrial level shunt detected by color flow Doppler.  LEFT VENTRICLE PLAX 2D LVIDd:         4.80 cm      Diastology LVIDs:         3.70 cm      LV e' medial:    5.10 cm/s LV PW:  0.70 cm      LV E/e' medial:  18.1 LV IVS:        1.00 cm      LV e' lateral:   7.16 cm/s LVOT diam:     1.80 cm      LV E/e' lateral: 12.9 LV SV:         19 LV SV Index:   9 LVOT Area:     2.54 cm  LV Volumes (MOD) LV vol d, MOD A2C: 144.0 ml LV vol d, MOD A4C: 112.0 ml LV vol s, MOD A2C: 55.8 ml LV vol s, MOD A4C: 54.3 ml LV SV MOD A2C:     88.2 ml LV SV MOD A4C:     112.0 ml LV SV MOD BP:      75.2 ml RIGHT VENTRICLE RV S prime:     7.80 cm/s TAPSE (M-mode): 1.5 cm LEFT ATRIUM             Index LA diam:        4.00 cm 1.98 cm/m LA Vol (A2C):   37.2 ml 18.42 ml/m LA Vol (A4C):   28.9 ml 14.31 ml/m LA Biplane Vol: 35.2 ml 17.43 ml/m  AORTIC VALVE LVOT Vmax:   61.40 cm/s LVOT Vmean:  45.800 cm/s LVOT VTI:    0.075 m  AORTA Ao Root diam: 2.80 cm MITRAL VALVE               TRICUSPID VALVE MV Area (PHT): 0.16 cm     TR Peak grad:   40.7 mmHg MV Decel Time: 4870 msec   TR Vmax:        319.00 cm/s MV E velocity: 92.55 cm/s MV A velocity: 44.10 cm/s  SHUNTS MV E/A ratio:  2.10        Systemic VTI:  0.08 m                            Systemic Diam: 1.80 cm Ena Dawley MD Electronically signed by Ena Dawley MD Signature Date/Time: 02/25/2020/1:23:40 PM    Final     Cardiac Studies   Echo 02/25/2020 1. LVEF 50-55% with wall motion abnormalities suspicious for mid LAD. No  apical thrombus.  2. Left ventricular ejection fraction, by estimation, is 50 to 55%. The  left ventricle has low normal function. The left ventricle demonstrates  regional wall motion abnormalities (see scoring diagram/findings for  description). Left ventricular diastolic  parameters are consistent with Grade II diastolic dysfunction  (pseudonormalization). Elevated left atrial pressure.  3. Right ventricular systolic function is normal. The right ventricular  size is normal. There is moderately elevated pulmonary artery systolic  pressure. The estimated right ventricular systolic pressure is 62.3 mmHg.  4. Left atrial size was mildly dilated.  5. The mitral valve is normal in structure. Moderate mitral valve  regurgitation. No evidence of mitral stenosis.  6. The aortic valve is normal in structure. Aortic valve regurgitation is  not visualized. Mild to moderate aortic valve sclerosis/calcification is  present, without any evidence of aortic stenosis.  7. The inferior vena cava is normal in size with greater than 50%  respiratory variability, suggesting right atrial pressure of 3 mmHg.   Patient Profile     60 y.o. male with a hx of HTN and DM who is being seen for the evaluation of VF arrest and Afib in setting of rectal bleeding.   Assessment & Plan  1.  VT Arrest 2. NSTEMI 3. Possible cardiogenic shock   - Had VT arrest on 1/4 in setting of ongoing GIB. ROSC after 2 minutes of CPR and cardioversion. Troponin  peaked > 15K, now trending down. Had dynamic EKG changes post arrest (ST depression inferior and anterior lateral leads), resolved on last EKG.  - Started on heparin gtt without bolus after clearance from GI>>> pending CBC today  - Echo showed LVEF of 50-55% with wall motion abnormalities suspicious for mid LAD. No  apical thrombus. Grade II DD.  - Diagnostic cath next week once stabilized  - No BB or ACE/ARB while needing pressors.   4. Post arrest atrial fibrillation with RVR - Converted to sinus and maintaining sinus rhythm  - Not an anticoagulation candidate with ongoing GI bleed - Keep K > 4 and Mg > 4   5. GI blood 2nd to likely infectious colitis/ Anemia  - Per GI  For questions or updates, please contact [place] Please consult [email or web address] for contact info under        SignedLeanor Kail, PA  02/26/2020, 11:36 AM     Patient seen and examined and agree with Leanor Kail, PA as detailed above.  In brief, the patient isa 60 y.o.malewith a history ofHTN and T2DM on daily aspirin $RemoveBefor'81mg'[id number]$  who presented to the ED 02/22/2020 with painless hematochezia. He was initially admitted to the floor but became more lethargic and hypotensive with continued bloody stools. On 01/04, the patient complained of chest pain and received nitro x1. Had recurrent episode and was found to be in VT on the monitor. CPR was initiated and the patient was emergently cardioverted. ET tube placed. Approximately 2 minutes prior to ROSC. Rhythm after ROSC was reportedly Afib with RVR.  Cardiology was called given concern for concern for rising troponin 15553 and VT arrest with concern for underlying ischemic etiology that has been exacerbated by acute blood loss anemia.  Currently, remains intubated and sedated. Off pressors. HgB overall stable with no melena/BRBPR overnight on heparin gtt. TTE with LVEF 50-55% with hypokinesis of the mid-to-distal anterior wall, mid-to-distal anterior  septum, and apex concerning for possible LAD disease.   Exam: TYO:MAYOKHTXH and sedated Neck:No JVD Cardiac:RR, no murmurs appreciated  Respiratory:Coarse vent sounds FS:FSEL, nontender, non-distended  MS:No edema; No deformity. Neuro:Sedated  Plan: -Continue heparin gtt and monitor closely for bleeding -Once more clinically stable, extubated and responsive, can plan for cath at that time -TTE with LVEF 50-55% with anterior, anteroseptal and apical hypokinesis suggestive of possible LAD disease -Will hold on DAPT for now while trial of heparin gtt -Continue amiodarone gtt for VT -Follow-up TTE -On ABX for fevers and keppra for jerking/seizure like activity -Keep K>4, Mg>2 -Will add GDMT as tolerated going forward once more clinically stable -Appreciate GI and CCM recommendations  CRITICAL CARE TIME: I have spent a total of 45 minutes with patient reviewing hospital notes, telemetry, EKGs, labs and examining the patient as well as establishing an assessment and plan that was discussed with the patient.  > 50% of time was spent in direct patient care. The patient is critically ill with multi-organ system failure and requires high complexity decision making for assessment and support, frequent evaluation and titration of therapies, application of advanced monitoring technologies and extensive interpretation of multiple databases.  [name], MD

## 2020-02-26 NOTE — Progress Notes (Signed)
eLink Physician-Brief Progress Note Patient Name: Colton Guzman DOB: 1960-07-24 MRN: [id number]   Date of Service  02/26/2020  HPI/Events of Note  Respiratory Therapy states that patent has been on PSV all day and has done well. Spontaneous TVs good and Ve matched to what he was getting on PRVC mode. However, he was not synchronous with the ventilator on PRVC,  eICU Interventions  Will leave on PSV mode at this time.      Intervention Category Major Interventions: Respiratory failure - evaluation and management  [name] 02/26/2020, 9:05 PM

## 2020-02-27 ENCOUNTER — Inpatient Hospital Stay (HOSPITAL_COMMUNITY): Payer: Managed Care, Other (non HMO)

## 2020-02-27 ENCOUNTER — Encounter (HOSPITAL_COMMUNITY)
Admission: EM | Disposition: A | Discharge status: Home / Self Care | Payer: Self-pay | Source: Home / Self Care | Attending: Pulmonary Disease

## 2020-02-27 DIAGNOSIS — J9601 Acute respiratory failure with hypoxia: Secondary | ICD-10-CM | POA: Diagnosis not present

## 2020-02-27 DIAGNOSIS — K922 Gastrointestinal hemorrhage, unspecified: Secondary | ICD-10-CM | POA: Diagnosis not present

## 2020-02-27 DIAGNOSIS — R509 Fever, unspecified: Secondary | ICD-10-CM | POA: Diagnosis not present

## 2020-02-27 DIAGNOSIS — R579 Shock, unspecified: Secondary | ICD-10-CM | POA: Diagnosis not present

## 2020-02-27 DIAGNOSIS — I469 Cardiac arrest, cause unspecified: Secondary | ICD-10-CM | POA: Diagnosis not present

## 2020-02-27 HISTORY — PX: COLONOSCOPY WITH PROPOFOL: SHX5780

## 2020-02-27 HISTORY — PX: ESOPHAGOGASTRODUODENOSCOPY (EGD) WITH PROPOFOL: SHX5813

## 2020-02-27 LAB — CBC
HCT: 24 % — ABNORMAL LOW (ref 39.0–52.0)
Hemoglobin: 7.8 g/dL — ABNORMAL LOW (ref 13.0–17.0)
MCH: 29.5 pg (ref 26.0–34.0)
MCHC: 32.5 g/dL (ref 30.0–36.0)
MCV: 90.9 fL (ref 80.0–100.0)
Platelets: 118 10*3/uL — ABNORMAL LOW (ref 150–400)
RBC: 2.64 MIL/uL — ABNORMAL LOW (ref 4.22–5.81)
RDW: 16.7 % — ABNORMAL HIGH (ref 11.5–15.5)
WBC: 10.7 10*3/uL — ABNORMAL HIGH (ref 4.0–10.5)
nRBC: 1.8 % — ABNORMAL HIGH (ref 0.0–0.2)

## 2020-02-27 LAB — GLUCOSE, CAPILLARY
Glucose-Capillary: 132 mg/dL — ABNORMAL HIGH (ref 70–99)
Glucose-Capillary: 147 mg/dL — ABNORMAL HIGH (ref 70–99)
Glucose-Capillary: 157 mg/dL — ABNORMAL HIGH (ref 70–99)
Glucose-Capillary: 159 mg/dL — ABNORMAL HIGH (ref 70–99)
Glucose-Capillary: 170 mg/dL — ABNORMAL HIGH (ref 70–99)
Glucose-Capillary: 177 mg/dL — ABNORMAL HIGH (ref 70–99)

## 2020-02-27 LAB — COMPREHENSIVE METABOLIC PANEL
ALT: 536 U/L — ABNORMAL HIGH (ref 0–44)
AST: 320 U/L — ABNORMAL HIGH (ref 15–41)
Albumin: 2.5 g/dL — ABNORMAL LOW (ref 3.5–5.0)
Alkaline Phosphatase: 57 U/L (ref 38–126)
Anion gap: 14 (ref 5–15)
BUN: 19 mg/dL (ref 6–20)
CO2: 17 mmol/L — ABNORMAL LOW (ref 22–32)
Calcium: 7.4 mg/dL — ABNORMAL LOW (ref 8.9–10.3)
Chloride: 103 mmol/L (ref 98–111)
Creatinine, Ser: 0.72 mg/dL (ref 0.61–1.24)
GFR, Estimated: >60 mL/min (ref >60)
Glucose, Bld: 199 mg/dL — ABNORMAL HIGH (ref 70–99)
Potassium: 3.2 mmol/L — ABNORMAL LOW (ref 3.5–5.1)
Sodium: 134 mmol/L — ABNORMAL LOW (ref 135–145)
Total Bilirubin: 2.1 mg/dL — ABNORMAL HIGH (ref 0.3–1.2)
Total Protein: 5.4 g/dL — ABNORMAL LOW (ref 6.5–8.1)

## 2020-02-27 LAB — CULTURE, RESPIRATORY W GRAM STAIN: Culture: NO GROWTH

## 2020-02-27 LAB — CBC WITH DIFFERENTIAL/PLATELET
Abs Immature Granulocytes: 0.13 10*3/uL — ABNORMAL HIGH (ref 0.00–0.07)
Basophils Absolute: 0 10*3/uL (ref 0.0–0.1)
Basophils Relative: 0 %
Eosinophils Absolute: 0.1 10*3/uL (ref 0.0–0.5)
Eosinophils Relative: 1 %
HCT: 22 % — ABNORMAL LOW (ref 39.0–52.0)
Hemoglobin: 7.2 g/dL — ABNORMAL LOW (ref 13.0–17.0)
Immature Granulocytes: 1 %
Lymphocytes Relative: 13 %
Lymphs Abs: 1.5 10*3/uL (ref 0.7–4.0)
MCH: 31 pg (ref 26.0–34.0)
MCHC: 32.7 g/dL (ref 30.0–36.0)
MCV: 94.8 fL (ref 80.0–100.0)
Monocytes Absolute: 0.7 10*3/uL (ref 0.1–1.0)
Monocytes Relative: 6 %
Neutro Abs: 9.6 10*3/uL — ABNORMAL HIGH (ref 1.7–7.7)
Neutrophils Relative %: 79 %
Platelets: 130 10*3/uL — ABNORMAL LOW (ref 150–400)
RBC: 2.32 MIL/uL — ABNORMAL LOW (ref 4.22–5.81)
RDW: 17.3 % — ABNORMAL HIGH (ref 11.5–15.5)
WBC: 12.1 10*3/uL — ABNORMAL HIGH (ref 4.0–10.5)
nRBC: 1.4 % — ABNORMAL HIGH (ref 0.0–0.2)

## 2020-02-27 LAB — HEMOGLOBIN AND HEMATOCRIT, BLOOD
HCT: 37 % — ABNORMAL LOW (ref 39.0–52.0)
HCT: 24.8 % — ABNORMAL LOW (ref 39.0–52.0)
HCT: 27.1 % — ABNORMAL LOW (ref 39.0–52.0)
Hemoglobin: 12.2 g/dL — ABNORMAL LOW (ref 13.0–17.0)
Hemoglobin: 8 g/dL — ABNORMAL LOW (ref 13.0–17.0)
Hemoglobin: 9 g/dL — ABNORMAL LOW (ref 13.0–17.0)

## 2020-02-27 LAB — APTT
aPTT: 99 seconds — ABNORMAL HIGH (ref 24–36)
aPTT: 47 seconds — ABNORMAL HIGH (ref 24–36)

## 2020-02-27 LAB — PREPARE RBC (CROSSMATCH)

## 2020-02-27 LAB — PROTIME-INR
INR: 1.3 — ABNORMAL HIGH (ref 0.8–1.2)
Prothrombin Time: 16.1 seconds — ABNORMAL HIGH (ref 11.4–15.2)

## 2020-02-27 LAB — HEPARIN LEVEL (UNFRACTIONATED): Heparin Unfractionated: 0.15 IU/mL — ABNORMAL LOW (ref 0.30–0.70)

## 2020-02-27 LAB — BLOOD GAS, ARTERIAL
Acid-base deficit: 4.7 mmol/L — ABNORMAL HIGH (ref 0.0–2.0)
Bicarbonate: 17.9 mmol/L — ABNORMAL LOW (ref 20.0–28.0)
Drawn by: 441261
FIO2: 40
O2 Saturation: 94.2 %
Patient temperature: 98.6
pCO2 arterial: 26 mmHg — ABNORMAL LOW (ref 32.0–48.0)
pH, Arterial: 7.453 — ABNORMAL HIGH (ref 7.350–7.450)
pO2, Arterial: 66.8 mmHg — ABNORMAL LOW (ref 83.0–108.0)

## 2020-02-27 LAB — MAGNESIUM: Magnesium: 1.9 mg/dL (ref 1.7–2.4)

## 2020-02-27 SURGERY — ESOPHAGOGASTRODUODENOSCOPY (EGD) WITH PROPOFOL
Anesthesia: Moderate Sedation

## 2020-02-27 MED ORDER — SODIUM CHLORIDE 0.9 % IV BOLUS
1000.0000 mL | Freq: Once | INTRAVENOUS | Status: AC
  Administered 2020-02-27: 1000 mL via INTRAVENOUS

## 2020-02-27 MED ORDER — SODIUM CHLORIDE 0.9% IV SOLUTION: Freq: Once | INTRAVENOUS | Status: AC

## 2020-02-27 MED ORDER — POTASSIUM CHLORIDE 10 MEQ/50ML IV SOLN
10.0000 meq | INTRAVENOUS | Status: AC
  Administered 2020-02-27 (×4): 10 meq via INTRAVENOUS
  Filled 2020-02-27 (×4): qty 50

## 2020-02-27 MED ORDER — PROTAMINE SULFATE 10 MG/ML IV SOLN
25.0000 mg | INTRAVENOUS | Status: AC
  Administered 2020-02-27: 25 mg via INTRAVENOUS
  Filled 2020-02-27: qty 2.5

## 2020-02-27 MED ORDER — PROTAMINE SULFATE 10 MG/ML IV SOLN
50.0000 mg | INTRAVENOUS | Status: DC
  Filled 2020-02-27: qty 5

## 2020-02-27 MED ORDER — CALCIUM GLUCONATE-NACL 1-0.675 GM/50ML-% IV SOLN
1.0000 g | Freq: Once | INTRAVENOUS | Status: AC
  Administered 2020-02-27: 1000 mg via INTRAVENOUS
  Filled 2020-02-27: qty 50

## 2020-02-27 MED ORDER — VASOPRESSIN 20 UNITS/100 ML INFUSION FOR SHOCK
0.0300 [IU]/min | INTRAVENOUS | Status: DC
  Filled 2020-02-27: qty 100

## 2020-02-27 MED ORDER — DEXMEDETOMIDINE HCL IN NACL 200 MCG/50ML IV SOLN
0.4000 ug/kg/h | INTRAVENOUS | Status: DC
  Administered 2020-02-27: 0.4 ug/kg/h via INTRAVENOUS
  Administered 2020-02-28: 0.8 ug/kg/h via INTRAVENOUS
  Administered 2020-02-28: 0.7 ug/kg/h via INTRAVENOUS
  Administered 2020-02-28: 0.5 ug/kg/h via INTRAVENOUS
  Administered 2020-02-28: 0.8 ug/kg/h via INTRAVENOUS
  Administered 2020-02-28: 0.7 ug/kg/h via INTRAVENOUS
  Administered 2020-02-28: 0.4 ug/kg/h via INTRAVENOUS
  Administered 2020-02-28: 0.7 ug/kg/h via INTRAVENOUS
  Administered 2020-02-29: 0.6 ug/kg/h via INTRAVENOUS
  Administered 2020-02-29 (×2): 0.7 ug/kg/h via INTRAVENOUS
  Administered 2020-02-29: 0.8 ug/kg/h via INTRAVENOUS
  Administered 2020-03-01: 1.1 ug/kg/h via INTRAVENOUS
  Administered 2020-03-01: 0.9 ug/kg/h via INTRAVENOUS
  Administered 2020-03-01 (×2): 0.4 ug/kg/h via INTRAVENOUS
  Filled 2020-02-27 (×19): qty 50

## 2020-02-27 MED ORDER — SODIUM CHLORIDE 0.9 % IV SOLN: INTRAVENOUS | Status: DC

## 2020-02-27 SURGICAL SUPPLY — 24 items
BLOCK BITE 60FR ADLT L/F BLUE (MISCELLANEOUS) ×3 IMPLANT
ELECT REM PT RETURN 9FT ADLT (ELECTROSURGICAL)
ELECTRODE REM PT RTRN 9FT ADLT (ELECTROSURGICAL) IMPLANT
FCP BXJMBJMB 240X2.8X (CUTTING FORCEPS)
FLOOR PAD 36X40 (MISCELLANEOUS) ×3
FORCEP RJ3 GP 1.8X160 W-NEEDLE (CUTTING FORCEPS) IMPLANT
FORCEPS BIOP RAD 4 LRG CAP 4 (CUTTING FORCEPS) IMPLANT
FORCEPS BIOP RJ4 240 W/NDL (CUTTING FORCEPS)
FORCEPS BXJMBJMB 240X2.8X (CUTTING FORCEPS) IMPLANT
INJECTOR/SNARE I SNARE (MISCELLANEOUS) IMPLANT
LUBRICANT JELLY 4.5OZ STERILE (MISCELLANEOUS) IMPLANT
MANIFOLD NEPTUNE II (INSTRUMENTS) IMPLANT
NEEDLE SCLEROTHERAPY 25GX240 (NEEDLE) IMPLANT
PAD FLOOR 36X40 (MISCELLANEOUS) ×2 IMPLANT
PROBE APC STR FIRE (PROBE) IMPLANT
PROBE INJECTION GOLD (MISCELLANEOUS)
PROBE INJECTION GOLD 7FR (MISCELLANEOUS) IMPLANT
SNARE ROTATE MED OVAL 20MM (MISCELLANEOUS) IMPLANT
SNARE SHORT THROW 13M SML OVAL (MISCELLANEOUS) IMPLANT
SYR 50ML LL SCALE MARK (SYRINGE) IMPLANT
TRAP SPECIMEN MUCOUS 40CC (MISCELLANEOUS) IMPLANT
TUBING ENDO SMARTCAP PENTAX (MISCELLANEOUS) ×6 IMPLANT
TUBING IRRIGATION ENDOGATOR (MISCELLANEOUS) ×6 IMPLANT
WATER STERILE IRR 1000ML POUR (IV SOLUTION) IMPLANT

## 2020-02-27 NOTE — Progress Notes (Signed)
eLink Physician-Brief Progress Note Patient Name: Colton Guzman DOB: December 28, 1960 MRN: [id number]   Date of Service  02/27/2020  HPI/Events of Note  GI Bleed  PTT = 99 - PTT drawn about 20 minutes after Heparin infusion stopped which is about 1 hour and 50 minutes ago. Spoke with pharmacy about reversal of Heparin with Protamine.   eICU Interventions  Plan: 1. Pharmacy to calculate Protamine dose and enter verbal order.  2. Pharmacy to decide when to repeat PTT after Protamine administration and enter verbal order.      Intervention Category Major Interventions: Other:  Talisha Erby Cornelia Copa 02/27/2020, 3:07 AM

## 2020-02-27 NOTE — Progress Notes (Signed)
[facility] Progress Note Patient Name: Colton Guzman DOB: 1960/03/06 MRN: [id number]   Date of Service  02/27/2020  HPI/Events of Note  Hypokalemai - K+ = 3.2 and Creatinine = 0.72  eICU Interventions  Will replace K+     Intervention Category Major Interventions: Electrolyte abnormality - evaluation and management  [name] 02/27/2020, 2:55 AM

## 2020-02-27 NOTE — Brief Op Note (Signed)
02/22/2020 - 02/27/2020  1:31 PM  PATIENT:  Colton Guzman  60 y.o. male  PRE-OPERATIVE DIAGNOSIS:  GI bleed  POST-OPERATIVE DIAGNOSIS: Most likely diverticular bleed  PROCEDURE:  Procedure(s): ESOPHAGOGASTRODUODENOSCOPY (EGD) WITH PROPOFOL (N/A) COLONOSCOPY WITH PROPOFOL (Left)  SURGEON:  Surgeon(s) and Role: Panel 1:    * Alvan Culpepper, MD - Primary Panel 2:    * Shuntia Exton, MD - Primary  Findings ---------- -EGD negative for bleeding or any ulcer disease.  Mild gastritis noted. -Colonoscopy showed evidence of recent bleeding.  Visualization was difficult because of multiple blood clots.  I was not able to identify any active bleeding source.  Most likely diverticular bleeding.  Recommendations -------------------------- -Procedure findings discussed with patient's wife at bedside. -Recommend transfusion of additional 2 units  -Get GI bleeding scan -Monitor H&H -Okay to extubate from GI standpoint -GI will follow  Otis Brace MD, [place] 02/27/2020, 1:33 PM  Contact #  [phone]

## 2020-02-27 NOTE — Progress Notes (Signed)
-  Received page from RN regarding patient's bleeding episodes.  Patient had few episodes of bleeding in last 2 to 3 hours.  Discussed with RN at 2:41 AM as well as 3:34 AM. -Chart reviewed.  Heparin drip was stopped round 1 AM. -Elevated aPTT -99.  -In process of receiving reversal of heparin with protamine in the  next few minutes. -Hemoglobin 7.2.  2 units of blood has been ordered.  Based on discussion with RN, he will receive PRBC next 20 minutes.  Currently getting bolus normal saline. -If ongoing bleeding, will consider colonoscopy and EGD later today after discussion with the family  -Recommend frequent monitoring with CBC  Patient with multiple medical issues and he remains critically ill.  Prognosis remains guarded.  Otis Brace MD, [place] 02/27/2020, 3:44 AM  Contact #  [phone]

## 2020-02-27 NOTE — Progress Notes (Signed)
[facility] Progress Note  Colton Guzman 60 y.o. 02-May-1960  CC: Rectal bleeding, cardiac arrest, respiratory failure   Subjective: Patient remains intubated.  Overnight events noted.  Case was discussed with multiple times throughout the night with night RN.  Please see progress note from around 3:38 AM this morning.  Received blood transfusion because of several episodes of rectal bleeding overnight.  Hemoglobin improved after blood transfusion.  ROS : Not able to obtain.   Objective: Vital signs in last 24 hours: Vitals:   02/27/20 0747 02/27/20 0800  BP:  98/63  Pulse:  84  Resp:  17  Temp:  (!) 100.6 F (38.1 C)  SpO2: 96% 99%    Physical Exam:  General-remains intubated. Coarse breath sounds.  Anterior lung exam only. Abdomen minimally distended, bowel sounds present.  No peritoneal signs. Neuro.  Not able to obtain Psych.  Not able to obtain Lab Results: Recent Labs    02/26/20 0200 02/26/20 1753 02/27/20 0145  NA 134*  --  134*  K 3.9  --  3.2*  CL 100  --  103  CO2 23  --  17*  GLUCOSE 127*  --  199*  BUN 13  --  19  CREATININE 0.77  --  0.72  CALCIUM 7.8*  --  7.4*  MG 1.9 2.1 1.9  PHOS 2.5 2.0*  --    Recent Labs    02/26/20 0200 02/27/20 0145  AST 756* 320*  ALT 675* 536*  ALKPHOS 54 57  BILITOT 1.2 2.1*  PROT 6.4* 5.4*  ALBUMIN 3.2* 2.5*   Recent Labs    02/26/20 1753 02/26/20 2306 02/27/20 0145 02/27/20 0700  WBC 12.6*  --  12.1*  --   NEUTROABS  --   --  9.6*  --   HGB 8.1*   < > 7.2* 8.0*  HCT 25.2*   < > 22.0* 24.8*  MCV 94.7  --  94.8  --   PLT 113*  --  130*  --    < > = values in this interval not displayed.   Recent Labs    02/27/20 0143  LABPROT 16.1*  INR 1.3*      Assessment/Plan:  Assessment  ---------------  -Rectal bleeding with CT scan showing mild circumferential wall thickening of the descending colon. Patient with history of diarrhea in the last week of December.  Most likely  infectious colitis. No previous GI symptoms.     Had few more episodes of rectal bleeding while on heparin drip around midnight on February 27, 2020.  Heparin was reversed.  Received 2 units of blood transfusion.  Heparin drip was stopped at around 1 AM February 27, 2020.   -Abnormal LFTs.  Most likely ischemic hepatitis from hypotension.  I Would not be surprised if T bili goes up in the next few days.      -Cardiac arrest with V. tach requiring resuscitation. Currently intubated.   A Fib + RVR post resuscitation .  Converted back to sinus rhythm  -Elevated troponins. ? Demand ischemia  -Acute blood loss anemia.   -DKA  -Lactic acidosis    Recommendations  -------------------------  -Long discussion with patient's wife at bedside. -Plan for bedside EGD and colonoscopy today.  Possibility of diffuse bleeding discussed.  Also if there is a diffuse colonic bleeding, controlling of bleeding with endoscopic intervention would not be feasible which was also discussed with patient's wife. -Continue to monitor H&H.  Transfuse if hemoglobin less than 8. -His  prognosis remains guarded with multiple comorbidities.  Approximately 35 minutes spent in the patient's encounter today.  Case was discussed multiple time with RN throughout the night.  Also discussed with ICU staff   Risks (bleeding, infection, bowel perforation that could require surgery, sedation-related changes in cardiopulmonary systems), benefits (identification and possible treatment of source of symptoms, exclusion of certain causes of symptoms), and alternatives (watchful waiting, radiographic imaging studies, empiric medical treatment)  were explained to family in detail and patient wishes to proceed.  Otis Brace MD, [place] 02/27/2020, 9:44 AM  Contact #  [phone]

## 2020-02-27 NOTE — Progress Notes (Signed)
[facility] Progress Note Patient Name: Excell Neyland DOB: August 23, 1960 MRN: [id number]   Date of Service  02/27/2020  HPI/Events of Note  GI bleed - Large amount of blood per rectum. BP = 109/48 with MAP = 67. Hr = 94. Last Hgb = 8.1 and platelets = 112 at about 6 PM. H/H pending from 11 PM. Heparin IV infusion has been off  eICU Interventions  Plan: 1. Await H/H from 11 PM.  2. PT/INR and PTT STAT.      Intervention Category Major Interventions: Other:  Majesti Gambrell Cornelia Copa 02/27/2020, 1:12 AM

## 2020-02-27 NOTE — Progress Notes (Addendum)
[facility] Progress Note Patient Name: Colton Guzman DOB: May 19, 1960 MRN: [id number]   Date of Service  02/27/2020  HPI/Events of Note  Hgb = 8.1 --> 12.2. I question this result.  eICU Interventions  Will repeat CBC with platelets.      Intervention Category Major Interventions: Other:  [name] 02/27/2020, 1:43 AM

## 2020-02-27 NOTE — Progress Notes (Signed)
[facility] Progress Note Patient Name: Colton Guzman DOB: [birth year] MRN: [id number]   Date of Service  02/27/2020  HPI/Events of Note  Anemia  GI Bleed - repeat Hgb = 8.1 --> 7.2 Platelets = 130.   eICU Interventions  Plan: 1. Transfuse 1 unit PRBC.  2. Await PT/INR and PTT results.      Intervention Category Major Interventions: Other:  [name] 02/27/2020, 2:19 AM

## 2020-02-27 NOTE — Progress Notes (Signed)
NAMEHailey Guzman, MRN:  [id number], DOB:  15-Jul-1960, LOS: 5 ADMISSION DATE:  02/22/2020, CONSULTATION DATE:  02/23/20 REFERRING MD:  Dr Bonner Puna, CHIEF COMPLAINT:  DKA, lethargy   Brief History:  60 year old man with hypertension, diabetes admitted 1/3 with painless hematochezia / apparent lower GI bleeding, leukocytosis. Hgb 13.2 >> 11.6 with IVF. CT abdomen with angiography 1/3 did not identify active bleeding source. Has had several more episodes of BRBPR since arrival and admission.  On 1/4 he had an episode of flatus and then belching with chest discomfort.  He was treated with NTG.  Then developed VT arrest with 76mn CPR with ROSC.  Labs consistent with evolving hyperglycemia and metabolic acidosis.  To ICU 1/4 pm.  IV access placed, Aline.  Required vasopressors, rising troponin, possible cardiogenic shock.   Past Medical History:  DM  HTN   Significant Hospital Events:  1/03 Admit  1/04 Tx to SDU with GI blood loss, hyperglycemia, metabolic acidosis.  Pt c/o chest discomfort around 1745, received nitroglycerin x1.  Recurrent chest discomfort and then VT on monitor. CPR approximately 2 minutes. ET intubated by CRNA, received etomidate and succinylcholine.  Not yet on sedation. Had large-volume bright red blood per rectum at the time of arrest. 1/05 On vasopressors, climbing troponin, no further bleeding. Improved on levophed. Cardiology consulted 1/06 PSV wean 5/5, off pressors 1/8 >  Hep off at 1 am for brb pr   Consults:  GI Cardiology   Procedures:  ETT 1/4 >>  R IJ TLC 1/4 >>  Significant Diagnostic Tests:   CT abdomen/pelvis 1/3 >> hepatic steatosis, cholelithiasis and absence of GB or ductal abnormality, normal pancreas, normal ureters, mild circumferential thickening of the descending colon question infectious or inflammatory colitis with scattered diverticula no evidence of GI blood loss.  Bilateral fat-containing inguinal hernias  EEG 1/6  This study is suggestive of  moderate to severe diffuse encephalopathy, nonspecific etiology. No seizures or epileptiform discharges were seen throughout the recording  Echo 1/6  EF 50-55% wall motion abnormalities suspicious for mid LAD Grade II diastolic dysfunction  (pseudonormalization). Elevated left atrial pressure moderately elevated pulmonary artery systolic  pressure. The estimated right ventricular systolic pressure is 469.6mmHg.  LA mildly dilated Moderate MV regurgitation  Mild to moderate aortic valve sclerosis/calcification is  present, without any evidence of aortic stenosis The mid and distal anterior wall, mid and distal anterior septum, and apex  are hypokinetic   Micro Data:  SARS CoV2 1/3 >> negative Influenza A/B 1/3 >> negative  MRSA  PCR  1/4 >  Neg  BC x 2 1/4>>  UA 1/5 >> rare bacteria, glucose >500 / ketones 20, uric acid crystals present  Tracheal aspirate 1/5 >> abundant WBC >>  BC x 2  1/7 >>> Antimicrobials:  Zosyn 1/4 >>  Vanc 1/6 >>   Scheduled Meds: . chlorhexidine gluconate (MEDLINE KIT)  15 mL Mouth Rinse BID  . Chlorhexidine Gluconate Cloth  6 each Topical Q0600  . docusate  100 mg Per Tube BID  . feeding supplement (PROSource TF)  45 mL Per Tube TID  . free water  100 mL Per Tube Q6H  . insulin aspart  0-9 Units Subcutaneous Q4H  . insulin glargine  10 Units Subcutaneous BID  . mouth rinse  15 mL Mouth Rinse 10 times per day  . pantoprazole (PROTONIX) IV  40 mg Intravenous Q12H  . polyethylene glycol  17 g Per Tube Daily  . sodium chloride flush  10-40 mL Intracatheter Q12H   Continuous Infusions: . dextrose 5% lactated ringers 10 mL/hr at 02/27/20 0400  . feeding supplement (OSMOLITE 1.5 CAL) Stopped (02/27/20 0120)  . fentaNYL infusion INTRAVENOUS 50 mcg/hr (02/27/20 0400)  . heparin Stopped (02/27/20 0120)  . levETIRAcetam Stopped (02/27/20 0239)  . midazolam 1 mg/hr (02/27/20 0400)  . norepinephrine (LEVOPHED) Adult infusion 2 mcg/min (02/27/20 0745)  .  piperacillin-tazobactam (ZOSYN)  IV 3.375 g (02/27/20 0529)  . vancomycin Stopped (02/27/20 0048)  . vasopressin 0.03 Units/min (02/25/20 0518)   PRN Meds:.alum & mag hydroxide-simeth, fentaNYL, LORazepam, midazolam, nitroGLYCERIN, ondansetron **OR** ondansetron (ZOFRAN) IV, sodium chloride flush   Interim History / Subjective:   recurrent LGIB and fever/ cvp 8 prior to transfusion/ levophed back on low doses     Objective   Blood pressure (!) 88/73, pulse 87, temperature (!) 100.4 F (38 C), temperature source Bladder, resp. rate 17, height _0  (1.727 m), weight 94.2 kg, SpO2 97 %. CVP:  [8 mmHg] 8 mmHg  Vent Mode: CPAP;PSV FiO2 (%):  [30 %-50 %] 30 % Set Rate:  [24 bmp] 24 bmp Vt Set:  [550 mL] 550 mL PEEP:  [5 cmH20] 5 cmH20 Pressure Support:  [5 cmH20-10 cmH20] 10 cmH20 Plateau Pressure:  [19 cmH20] 19 cmH20   Intake/Output Summary (Last 24 hours) at 02/27/2020 0726 Last data filed at 02/27/2020 0630 Gross per 24 hour  Intake 4682.61 ml  Output 2085 ml  Net 2597.61 ml   Filed Weights   02/22/20 2246 02/26/20 0500 02/27/20 0345  Weight: 88.2 kg 88.2 kg 94.2 kg    Examination: Tmax 102.2 Sedated breathing RR 20 with VT 600 on PSV  No jvd Oropharynx et/ og  (no blood) Neck supple Lungs withmin  rhonchi bilaterally RRR no s3 or or sign murmur  NSR on monitor Abd mod distended, nl  excursion  Extr warm with no edema or clubbing/ PAS in place  Neuro  Moves all 4 when not sedated, not f/c    I personally reviewed images and agree with radiology impression as follows:  CXR:  Portable 1/8 1. Persistent though slightly improved bilateral perihilar opacities likely reflecting some improving pulmonary edema. 2. Persistent hypoinflation. 3. Support lines and tubes as above.  Resolved Hospital Problem list     Assessment & Plan:   S/p VT Arrest 1/4 , in the setting of acute GI blood loss, presumed hyporperfusion.  ECG just prior and then post resuscitation with  anterior ST elevations. A Fib + RVR post resuscitation> back to SR on amiodarone  Cardiogenic Shock CHADSVASC - 2   -normothermia goal  -continue amiodarone  - levophed for MAP >65  - troponin peak 15,553 - follow SvO2   -will likely need diagnostic cath once stabilized   Acute Hypoxic Respiratory Failure and mechanical ventilation post-arrest Suspected Aspiration due to Cardiac Arrest  Acute Pulmonary Edema   -wean PEEP / FiO2 for sats >90% -follow intermittent CXR   Jerking suspicious for seizure activity EEG negative for seizures - continue Keppra - holding versed am 1/8     Fever  Possible etiologies include MI, aspiration post arrest, GI source -follow cultures -abx as above for GI / pulm coverage - consider PICC placement once patient is afebrile x 24-48 hours   Diabetes Mellitus 2 with DKA/HHS Initially required insulin infusion, transitioned off 1/5  -CBG goal 140-180  -lantus 10 units BID while TF off -SSI, sensitive  scale  - holding home metformin, glyburide - Appreciate Diabetic  Co-ordinator assistance  Anion gap metabolic acidosis.   Due to DKA and also component of this was lactic acidosis, was improved with volume resuscitation and treatment of his DKA -supportive care, resolved   Hypophosphatemia  -monitor, replace as indicated   Acute GI bleed, appears lower based on history, hematochezia ? Cirrhosis   Lab Results  Component Value Date   HGB 8.0 (L) 02/27/2020   HGB 7.2 (L) 02/27/2020   HGB 12.2 (L) 02/26/2020    -  GI following closely ? Needs colonoscopy? 1/8  -transfuse for Hgb <8% with cardiac concerns  - TF on hold  - Goal to get TF resumes as soon as he can tolerate - Trending  LFT's  Colonic thickening, possible inflammatory versus infectious colitis by CT abdomen.  Associated with leukocytosis -continue abx per flow sheet    Thrombocytopenia  Lab Results  Component Value Date   PLT 130 (L) 02/27/2020   PLT 113 (L)  02/26/2020   PLT 118 (L) 02/26/2020    Fatty liver noted on CT, ? Underlying cirrhosis  -follow platelets     Hx Hypertension -holding home hyzaar, metoprolol, amlodipine   Hyperlipidemia - hold home atorvastatin for now - Trending LFT's and resume if normalize   Best practice (evaluated daily)  Diet: NPO / TF  Pain/Anxiety/Delirium protocol (if indicated): Fentanyl, versed  VAP protocol (if indicated): In place  DVT prophylaxis: SCD, no anticoagulation GI prophylaxis: PPI twice daily Glucose control: SSI Mobility: BR Disposition: ICU Family: pending  Goals of Care:  Last date of multidisciplinary goals of care discussion: Daily updates at length with wife 1/7 Family and staff present: Wife Summary of discussion:  Pending  Follow up goals of care discussion due: Daily updates Code Status: Full code  Labs   CBC: Recent Labs  Lab 02/23/20 0432 02/23/20 0435 02/23/20 2049 02/24/20 0000 02/24/20 1124 02/25/20 0439 02/26/20 0200 02/26/20 1341 02/26/20 1753 02/26/20 2306 02/27/20 0145 02/27/20 0700  WBC 19.7*   < > 16.3*  20.1*   < > 19.4* 14.0* 15.2*  --  12.6*  --  12.1*  --   NEUTROABS 14.9*  --  10.0*  --   --   --   --   --   --   --  9.6*  --   HGB 9.9*   < > 9.6*  7.2*  7.4*   < > 8.9* 8.5* 8.3* 8.4* 8.1* 12.2* 7.2* 8.0*  HCT 30.6*   < > 28.1*  20.6*  23.1*   < > 26.2* 26.0* 25.2* 25.7* 25.2* 37.0* 22.0* 24.8*  MCV 96.8   < > 88.6  92.0   < > 90.3 91.5 93.3  --  94.7  --  94.8  --   PLT 191   < > 87*  96*   < > 109* 88* 118*  --  113*  --  130*  --    < > = values in this interval not displayed.    Basic Metabolic Panel: Recent Labs  Lab 02/24/20 0432 02/24/20 1124 02/25/20 0439 02/25/20 1014 02/25/20 1743 02/26/20 0200 02/26/20 1753 02/27/20 0145  NA 135 132* 133*  --   --  134*  --  134*  K 3.7 4.4 3.6  --   --  3.9  --  3.2*  CL 105 103 104  --   --  100  --  103  CO2 20* 20* 23  --   --  23  --  17*  GLUCOSE 255* 334* 130*  --   --   127*  --  199*  BUN _0 --   --  13  --  19  CREATININE 0.81 0.94 0.75  --   --  0.77  --  0.72  CALCIUM 7.5* 7.4* 7.6*  --   --  7.8*  --  7.4*  MG 2.1  --  2.0 2.1 1.8 1.9 2.1 1.9  PHOS 1.5*  --  1.9* 2.0* 2.7 2.5 2.0*  --    GFR: Estimated Creatinine Clearance: 113.4 mL/min (by C-G formula based on SCr of 0.72 mg/dL). Recent Labs  Lab 02/23/20 0435 02/23/20 0534 02/23/20 1113 02/23/20 2049 02/24/20 0000 02/24/20 0020 02/24/20 0432 02/24/20 1124 02/24/20 1249 02/25/20 0439 02/25/20 0440 02/26/20 0200 02/26/20 1753 02/27/20 0145  PROCALCITON 0.29  --   --   --   --   --  0.22  --   --   --  0.36 0.46  --   --   WBC  --   --  18.8* 16.3*  20.1*   < >  --  17.5*   < >  --  14.0*  --  15.2* 12.6* 12.1*  LATICACIDVEN  --    < > 5.0* 6.4*  --  2.1*  --   --  3.2*  --   --   --   --   --    < > = values in this interval not displayed.    Liver Function Tests: Recent Labs  Lab 02/22/20 1215 02/22/20 2107 02/23/20 2049 02/26/20 0200 02/27/20 0145  AST 29 29 41 756* 320*  ALT 43 <5 37 675* 536*  ALKPHOS 69 44 30* 54 57  BILITOT 1.1 0.9 0.5 1.2 2.1*  PROT 8.3* 6.1* 4.4* 6.4* 5.4*  ALBUMIN 4.1 3.1* 2.5* 3.2* 2.5*   Recent Labs  Lab 02/23/20 0450  LIPASE 53*   Recent Labs  Lab 02/23/20 2049  AMMONIA 29    ABG    Component Value Date/Time   PHART 7.383 02/23/2020 2129   PCO2ART 34.0 02/23/2020 2129   PO2ART 238 (H) 02/23/2020 2129   HCO3 19.8 (L) 02/23/2020 2129   ACIDBASEDEF 4.2 (H) 02/23/2020 2129   O2SAT 97.6 02/25/2020 0814     Coagulation Profile: Recent Labs  Lab 02/22/20 2104 02/23/20 2049 02/27/20 0143  INR 1.3* 1.5* 1.3*    Cardiac Enzymes: No results for input(s): CKTOTAL, CKMB, CKMBINDEX, TROPONINI in the last 168 hours.  HbA1C: Hgb A1c MFr Bld  Date/Time Value Ref Range Status  02/22/2020 09:04 PM 10.3 (H) 4.8 - 5.6 % Final    Comment:    (NOTE) Pre diabetes:          5.7%-6.4%  Diabetes:              >6.4%  Glycemic  control for   <7.0% adults with diabetes     CBG: Recent Labs  Lab 02/26/20 1120 02/26/20 1513 02/26/20 2035 02/26/20 2351 02/27/20 0336  GLUCAP 93 106* 159* 164* 170*      The patient is critically ill with multiple organ systems failure and requires high complexity decision making for assessment and support, frequent evaluation and titration of therapies, application of advanced monitoring technologies and extensive interpretation of multiple databases. Critical Care Time devoted to patient care services described in this note is 45 minutes.    Christinia Gully, MD [facility] [phone]   After  7:00 pm call Elink  [phone]

## 2020-02-27 NOTE — Op Note (Addendum)
[facility] Patient Name: Colton Guzman Procedure Date: 02/27/2020 MRN: [id number] Attending MD: Otis Brace , MD Date of Birth: Jul 18, 1960 CSN: [id number] Age: 60 Admit Type: Inpatient Procedure:                Upper GI endoscopy Indications:              Active gastrointestinal bleeding, Recent                            gastrointestinal bleeding Providers:                Otis Brace, MD, Kary Kos RN, RN,                            Nelia Shi, RN, Laverda Sorenson, Technician Referring MD:              Medicines:                Sedation Administered by an ICU Nurse Complications:            No immediate complications. Estimated Blood Loss:     Estimated blood loss was minimal. Procedure:                Pre-Anesthesia Assessment:                           - Prior to the procedure, a History and Physical                            was performed, and patient medications and                            allergies were reviewed. The patient's tolerance of                            previous anesthesia was also reviewed. The risks                            and benefits of the procedure and the sedation                            options and risks were discussed with the patient.                            All questions were answered, and informed consent                            was obtained. Prior Anticoagulants: The patient has                            taken heparin. ASA Grade Assessment: IV - A patient                            with severe systemic disease that is a constant  threat to life. After reviewing the risks and                            benefits, the patient was deemed in satisfactory                            condition to undergo the procedure.                           After obtaining informed consent, the endoscope was                            passed under direct vision. Throughout the                             procedure, the patient's blood pressure, pulse, and                            oxygen saturations were monitored continuously. The                            GIF-H190 ([id number]) Olympus gastroscope was                            introduced through the mouth, and advanced to the                            second part of duodenum. The upper GI endoscopy was                            technically difficult and complex due to the                            patient's discomfort during the procedure. The                            patient tolerated the procedure well. Scope In: Scope Out: Findings:      The Z-line was regular and was found 39 cm from the incisors.      The exam of the esophagus was otherwise normal.      There is no endoscopic evidence of bleeding or ulceration in the entire       examined stomach.      The cardia and gastric fundus were normal on retroflexion.      Scattered mild inflammation was found in the prepyloric region of the       stomach.      The duodenal bulb, first portion of the duodenum and second portion of       the duodenum were normal. Impression:               - Z-line regular, 39 cm from the incisors.                           - Gastritis.                           -  Normal duodenal bulb, first portion of the                            duodenum and second portion of the duodenum.                           - No specimens collected. Moderate Sedation:      Moderate (conscious) sedation was ICU Nurse. The following parameters       were monitored: oxygen saturation, heart rate, blood pressure, and       response to care. Recommendation:           - Perform a colonoscopy today. Procedure Code(s):        --- Professional ---                           [phone], Esophagogastroduodenoscopy, flexible,                            transoral; diagnostic, including collection of                            specimen(s) by brushing or washing, when performed                             (separate procedure) Diagnosis Code(s):        --- Professional ---                           K92.2, Gastrointestinal hemorrhage, unspecified CPT copyright 2019 [employer]. All rights reserved. The codes documented in this report are preliminary and upon coder review may  be revised to meet current compliance requirements. Otis Brace, MD Otis Brace, MD 02/27/2020 1:23:12 PM Number of Addenda: 0

## 2020-02-27 NOTE — Progress Notes (Addendum)
Progress Note  Patient Name: Colton Guzman Date of Encounter: 02/27/2020  [facility] Cardiologist: No primary care provider on file.   Subjective   Unable to assess.  Intubated and sedated.  Inpatient Medications    Scheduled Meds: . chlorhexidine gluconate (MEDLINE KIT)  15 mL Mouth Rinse BID  . Chlorhexidine Gluconate Cloth  6 each Topical Q0600  . docusate  100 mg Per Tube BID  . feeding supplement (PROSource TF)  45 mL Per Tube TID  . free water  100 mL Per Tube Q6H  . insulin aspart  0-9 Units Subcutaneous Q4H  . insulin glargine  10 Units Subcutaneous BID  . mouth rinse  15 mL Mouth Rinse 10 times per day  . pantoprazole (PROTONIX) IV  40 mg Intravenous Q12H  . polyethylene glycol  17 g Per Tube Daily  . sodium chloride flush  10-40 mL Intracatheter Q12H   Continuous Infusions: . dextrose 5% lactated ringers Stopped (02/27/20 0529)  . feeding supplement (OSMOLITE 1.5 CAL) Stopped (02/27/20 0120)  . fentaNYL infusion INTRAVENOUS 25 mcg/hr (02/27/20 0803)  . levETIRAcetam Stopped (02/27/20 0239)  . midazolam Stopped (02/27/20 0744)  . norepinephrine (LEVOPHED) Adult infusion 2 mcg/min (02/27/20 0745)  . piperacillin-tazobactam (ZOSYN)  IV 12.5 mL/hr at 02/27/20 0803  . vancomycin Stopped (02/27/20 0048)  . vasopressin     PRN Meds: alum & mag hydroxide-simeth, fentaNYL, LORazepam, midazolam, nitroGLYCERIN, ondansetron **OR** ondansetron (ZOFRAN) IV, sodium chloride flush   Vital Signs    Vitals:   02/27/20 0630 02/27/20 0645 02/27/20 0747 02/27/20 0800  BP: (!) 88/73   98/63  Pulse: 88 87  84  Resp: _0 Temp:      TempSrc:      SpO2: 97% 97% 96% 99%  Weight:      Height:        Intake/Output Summary (Last 24 hours) at 02/27/2020 0815 Last data filed at 02/27/2020 0803 Gross per 24 hour  Intake 3652.86 ml  Output 2010 ml  Net 1642.86 ml   Last 3 Weights 02/27/2020 02/26/2020 02/22/2020  Weight (lbs) 207 lb 10.8 oz 194 lb 7.1 oz 194 lb 7.1 oz   Weight (kg) 94.2 kg 88.2 kg 88.2 kg      Telemetry    Sinus rhythm.- Personally Reviewed  ECG    02/26/20: Sinus tachycardia.  Rate 104 bpm.  Prior septal infarct.- Personally Reviewed  Physical Exam   VS:  BP 98/63   Pulse 84   Temp (!) 100.6 F (38.1 C) (Bladder) Comment: ice applied  Resp 17   Ht _1  (1.727 m)   Wt 94.2 kg   SpO2 99%   BMI 31.58 kg/m  , BMI Body mass index is 31.58 kg/m. GENERAL: Ill-appearing.  Intubated and sedated HEENT: Pupils equal round and reactive, fundi not visualized, oral mucosa unremarkable NECK:  No jugular venous distention, waveform within normal limits, carotid upstroke brisk and symmetric, no bruits LUNGS:  Clear to auscultation bilaterally HEART:  RRR.  PMI not displaced or sustained,S1 and S2 within normal limits, no S3, no S4, no clicks, no rubs, no murmurs ABD:  Flat, positive bowel sounds normal in frequency in pitch, no bruits, no rebound, no guarding, no midline pulsatile mass, no hepatomegaly, no splenomegaly EXT:  2 plus pulses throughout, no edema, no cyanosis no clubbing SKIN:  No rashes no nodules NEURO: Unable to assess PSYCH: Unable to assess   Labs    High Sensitivity Troponin:   Recent  Labs  Lab 02/23/20 2049 02/24/20 0432 02/24/20 1124 02/24/20 1803 02/24/20 2000  TROPONINIHS 178*  23* 12,216* 15,553* 12,157* 9,517*      Chemistry Recent Labs  Lab 02/23/20 2049 02/24/20 0000 02/25/20 0439 02/26/20 0200 02/27/20 0145  NA 136  138   < > 133* 134* 134*  K 3.0*  3.0*   < > 3.6 3.9 3.2*  CL 106  107   < > 104 100 103  CO2 19*  19*   < > 23 23 17*  GLUCOSE 291*  272*   < > 130* 127* 199*  BUN 16  18   < > _0 CREATININE 0.77  0.83   < > 0.75 0.77 0.72  CALCIUM 7.3*  7.3*   < > 7.6* 7.8* 7.4*  PROT 4.4*  --   --  6.4* 5.4*  ALBUMIN 2.5*  --   --  3.2* 2.5*  AST 41  --   --  756* 320*  ALT 37  --   --  675* 536*  ALKPHOS 30*  --   --  54 57  BILITOT 0.5  --   --  1.2 2.1*  GFRNONAA  >60  >60   < > >60 >60 >60  ANIONGAP 11  12   < > _1 < > = values in this interval not displayed.     Hematology Recent Labs  Lab 02/26/20 0200 02/26/20 1341 02/26/20 1753 02/26/20 2306 02/27/20 0145 02/27/20 0700  WBC 15.2*  --  12.6*  --  12.1*  --   RBC 2.70*  --  2.66*  --  2.32*  --   HGB 8.3*   < > 8.1* 12.2* 7.2* 8.0*  HCT 25.2*   < > 25.2* 37.0* 22.0* 24.8*  MCV 93.3  --  94.7  --  94.8  --   MCH 30.7  --  30.5  --  31.0  --   MCHC 32.9  --  32.1  --  32.7  --   RDW 17.2*  --  17.2*  --  17.3*  --   PLT 118*  --  113*  --  130*  --    < > = values in this interval not displayed.    BNPNo results for input(s): BNP, PROBNP in the last 168 hours.   DDimer No results for input(s): DDIMER in the last 168 hours.   Radiology    CT HEAD WO CONTRAST  Result Date: 02/25/2020 CLINICAL DATA:  Mental status change, possible seizure activity EXAM: CT HEAD WITHOUT CONTRAST TECHNIQUE: Contiguous axial images were obtained from the base of the skull through the vertex without intravenous contrast. COMPARISON:  None. FINDINGS: Brain: Minor brain atrophy pattern without acute intracranial hemorrhage, mass lesion, new infarction, midline shift, herniation, or hydrocephalus. No extra-axial fluid collection. No focal mass effect or edema. Cisterns are patent. Cerebellar atrophy as well. Vascular: Intracranial atherosclerosis at the skull base. No hyperdense vessel. Skull: Normal. Negative for fracture or focal lesion. Sinuses/Orbits: No acute finding. Other: None. IMPRESSION: Mild brain atrophy pattern without acute intracranial abnormality by noncontrast CT. Electronically Signed   By: Jerilynn Mages.  Shick M.D.   On: 02/25/2020 15:21   DG CHEST PORT 1 VIEW  Result Date: 02/27/2020 CLINICAL DATA:  Respiratory failure EXAM: PORTABLE CHEST 1 VIEW COMPARISON:  02/26/2020 FINDINGS: Endotracheal tube 2.8 cm from the carina. Transesophageal tube tip and side port beyond the margins of imaging, below  the  GE junction. Right IJ approach central venous catheter tip terminates at the superior cavoatrial junction. Telemetry leads and external support devices overlie the chest. Persistent though slightly improved bilateral perihilar opacities are seen. Some mild right fissural thickening is noted. The pulmonary vascularity remains indistinct. No pneumothorax or visible effusion. Persistent hypoinflation. No acute osseous or soft tissue abnormality. IMPRESSION: 1. Persistent though slightly improved bilateral perihilar opacities likely reflecting some improving pulmonary edema. 2. Persistent hypoinflation. 3. Support lines and tubes as above. Electronically Signed   By: Lovena Le M.D.   On: 02/27/2020 04:45   DG Chest Port 1 View  Result Date: 02/26/2020 CLINICAL DATA:  Respiratory failure EXAM: PORTABLE CHEST 1 VIEW COMPARISON:  02/25/2020 FINDINGS: Single frontal view of the chest demonstrates endotracheal tube, tip approximately 2 cm above carina. Enteric catheter passes below diaphragm tip excluded by collimation. Right internal jugular catheter tip projects over superior vena cava. Cardiac silhouette is stable. There is progressive bilateral perihilar airspace disease. No large effusion or pneumothorax. No acute bony abnormalities. IMPRESSION: 1. Support devices as above. 2. Worsening bilateral perihilar airspace disease compatible with progressive edema. Electronically Signed   By: Randa Ngo M.D.   On: 02/26/2020 02:56   DG Abd Portable 1V  Result Date: 02/26/2020 CLINICAL DATA:  Vomiting EXAM: PORTABLE ABDOMEN - 1 VIEW COMPARISON:  02/23/2020 FINDINGS: Supine frontal view of the abdomen and pelvis excludes the bilateral flanks by collimation. Enteric catheter tip projects over the gastric antrum, stable. Minimal gas and stool throughout the colon to the level of the rectum. No bowel obstruction or ileus. No masses or abnormal calcifications. IMPRESSION: 1. Stable enteric catheter. 2. Unremarkable  bowel gas pattern. Electronically Signed   By: Randa Ngo M.D.   On: 02/26/2020 02:54   EEG adult  Result Date: 02/26/2020 Lora Havens, MD     02/26/2020  8:41 AM Patient Name: Colton Guzman MRN: [id number] Epilepsy Attending: Lora Havens Referring Physician/Provider: Noe Gens, NP Date: 02/25/2020 Duration: 23.22 mins Patient history: 60yo M with seizure like activity. EEG to evaluate for seizure Level of alertness:  comatose AEDs during EEG study: LEV Technical aspects: This EEG study was done with scalp electrodes positioned according to the 10-20 International system of electrode placement. Electrical activity was acquired at a sampling rate of 500Hz and reviewed with a high frequency filter of 70Hz and a low frequency filter of 1Hz. EEG data were recorded continuously and digitally stored. Description: EEG showed continuous generalized 6-7Hz theta slowing as well as intermittent 2-3hz delta slowing.  Hyperventilation and photic stimulation were not performed.   ABNORMALITY -Continuous slow, generalized IMPRESSION: This study is suggestive of moderate to severe diffuse encephalopathy, nonspecific etiology. No seizures or epileptiform discharges were seen throughout the recording. Lora Havens   ECHOCARDIOGRAM COMPLETE  Result Date: 02/25/2020    ECHOCARDIOGRAM REPORT   Patient Name:   Colton Guzman Date of Exam: 02/25/2020 Medical Rec #:  [id number]      Height:       68.0 in Accession #:    [id number]     Weight:       194.4 lb Date of Birth:  [birth year]       BSA:          2.019 m Patient Age:    37 years       BP:           103/61 mmHg Patient Gender: M  HR:           97 bpm. Exam Location:  Inpatient Procedure: 2D Echo, Color Doppler, Cardiac Doppler and Intracardiac            Opacification Agent Indications:    Cardiac arrest I46.9  History:        Patient has no prior history of Echocardiogram examinations.                 Signs/Symptoms:Fever; Risk Factors:Hypertension,  Diabetes and                 Dyslipidemia. Acute GI blood loss, presumed hyporperfusion. ECG                 just prior and then post resuscitation with anterior ST                 elevations.                 A Fib + RVR post resuscitation                 Cardiogenic Shock.  Sonographer:    Darlina Sicilian RDCS Referring Phys: [id number] Donita Brooks  Sonographer Comments: Echo performed with patient supine and on artificial respirator. IMPRESSIONS  1. LVEF 50-55% with wall motion abnormalities suspicious for mid LAD. No apical thrombus.  2. Left ventricular ejection fraction, by estimation, is 50 to 55%. The left ventricle has low normal function. The left ventricle demonstrates regional wall motion abnormalities (see scoring diagram/findings for description). Left ventricular diastolic  parameters are consistent with Grade II diastolic dysfunction (pseudonormalization). Elevated left atrial pressure.  3. Right ventricular systolic function is normal. The right ventricular size is normal. There is moderately elevated pulmonary artery systolic pressure. The estimated right ventricular systolic pressure is 06.3 mmHg.  4. Left atrial size was mildly dilated.  5. The mitral valve is normal in structure. Moderate mitral valve regurgitation. No evidence of mitral stenosis.  6. The aortic valve is normal in structure. Aortic valve regurgitation is not visualized. Mild to moderate aortic valve sclerosis/calcification is present, without any evidence of aortic stenosis.  7. The inferior vena cava is normal in size with greater than 50% respiratory variability, suggesting right atrial pressure of 3 mmHg. FINDINGS  Left Ventricle: Left ventricular ejection fraction, by estimation, is 50 to 55%. The left ventricle has low normal function. The left ventricle demonstrates regional wall motion abnormalities. Definity contrast agent was given IV to delineate the left ventricular endocardial borders. The left ventricular internal  cavity size was normal in size. There is no left ventricular hypertrophy. Left ventricular diastolic parameters are consistent with Grade II diastolic dysfunction (pseudonormalization). Elevated left atrial pressure.  LV Wall Scoring: The mid and distal anterior wall, mid and distal anterior septum, and apex are hypokinetic. Right Ventricle: The right ventricular size is normal. No increase in right ventricular wall thickness. Right ventricular systolic function is normal. There is moderately elevated pulmonary artery systolic pressure. The tricuspid regurgitant velocity is 3.19 m/s, and with an assumed right atrial pressure of 8 mmHg, the estimated right ventricular systolic pressure is 01.6 mmHg. Left Atrium: Left atrial size was mildly dilated. Right Atrium: Right atrial size was normal in size. Pericardium: There is no evidence of pericardial effusion. Mitral Valve: The mitral valve is normal in structure. Moderate mitral valve regurgitation. No evidence of mitral valve stenosis. Tricuspid Valve: The tricuspid valve is normal in structure. Tricuspid valve regurgitation is mild . No  evidence of tricuspid stenosis. Aortic Valve: The aortic valve is normal in structure. Aortic valve regurgitation is not visualized. Mild to moderate aortic valve sclerosis/calcification is present, without any evidence of aortic stenosis. Pulmonic Valve: The pulmonic valve was normal in structure. Pulmonic valve regurgitation is not visualized. No evidence of pulmonic stenosis. Aorta: The aortic root is normal in size and structure. Venous: The inferior vena cava is normal in size with greater than 50% respiratory variability, suggesting right atrial pressure of 3 mmHg. IAS/Shunts: No atrial level shunt detected by color flow Doppler.  LEFT VENTRICLE PLAX 2D LVIDd:         4.80 cm      Diastology LVIDs:         3.70 cm      LV e' medial:    5.10 cm/s LV PW:         0.70 cm      LV E/e' medial:  18.1 LV IVS:        1.00 cm      LV e'  lateral:   7.16 cm/s LVOT diam:     1.80 cm      LV E/e' lateral: 12.9 LV SV:         19 LV SV Index:   9 LVOT Area:     2.54 cm  LV Volumes (MOD) LV vol d, MOD A2C: 144.0 ml LV vol d, MOD A4C: 112.0 ml LV vol s, MOD A2C: 55.8 ml LV vol s, MOD A4C: 54.3 ml LV SV MOD A2C:     88.2 ml LV SV MOD A4C:     112.0 ml LV SV MOD BP:      75.2 ml RIGHT VENTRICLE RV S prime:     7.80 cm/s TAPSE (M-mode): 1.5 cm LEFT ATRIUM             Index LA diam:        4.00 cm 1.98 cm/m LA Vol (A2C):   37.2 ml 18.42 ml/m LA Vol (A4C):   28.9 ml 14.31 ml/m LA Biplane Vol: 35.2 ml 17.43 ml/m  AORTIC VALVE LVOT Vmax:   61.40 cm/s LVOT Vmean:  45.800 cm/s LVOT VTI:    0.075 m  AORTA Ao Root diam: 2.80 cm MITRAL VALVE               TRICUSPID VALVE MV Area (PHT): 0.16 cm    TR Peak grad:   40.7 mmHg MV Decel Time: 4870 msec   TR Vmax:        319.00 cm/s MV E velocity: 92.55 cm/s MV A velocity: 44.10 cm/s  SHUNTS MV E/A ratio:  2.10        Systemic VTI:  0.08 m                            Systemic Diam: 1.80 cm Ena Dawley MD Electronically signed by Ena Dawley MD Signature Date/Time: 02/25/2020/1:23:40 PM    Final     Cardiac Studies   Echo 02/25/2020 1. LVEF 50-55% with wall motion abnormalities suspicious for mid LAD. No  apical thrombus.  2. Left ventricular ejection fraction, by estimation, is 50 to 55%. The  left ventricle has low normal function. The left ventricle demonstrates  regional wall motion abnormalities (see scoring diagram/findings for  description). Left ventricular diastolic  parameters are consistent with Grade II diastolic dysfunction  (pseudonormalization). Elevated left atrial pressure.  3. Right ventricular systolic function  is normal. The right ventricular  size is normal. There is moderately elevated pulmonary artery systolic  pressure. The estimated right ventricular systolic pressure is 81.8 mmHg.  4. Left atrial size was mildly dilated.  5. The mitral valve is normal in structure.  Moderate mitral valve  regurgitation. No evidence of mitral stenosis.  6. The aortic valve is normal in structure. Aortic valve regurgitation is  not visualized. Mild to moderate aortic valve sclerosis/calcification is  present, without any evidence of aortic stenosis.  7. The inferior vena cava is normal in size with greater than 50%  respiratory variability, suggesting right atrial pressure of 3 mmHg.   Patient Profile     60 y.o. male with diabetes and hypertension admitted with VF arrest and atrial fibrillation in the setting of rectal bleeding.  Assessment & Plan    # VF arrest:  Occurred on 1/4 in the setting of GIB. He he an episode of flatus and belching with chest discomfort. ROSC after 2 minutes of CPR and defibrillation.  HS troponin peaked >15,000.  Dynamic EKG changes noted.  EKG consistent with prior septal infarct.  Echo with LVEF 50-55%.  Will need diagnostic cath once stable.  BP soft and he was started on vasopresin and levophed this AM.  CVP is 13.  # Post-arrest atrial fibrillation with RVR: Supplement for K>4, Mg >2.  Amiodarone discontinued 1/7.  # GI bleed:  Painless hematochezia thought to be 2/2 infectious colitis.  He remains slightly febrile.  Hgb was 12.2 yesterday and 8 today.  Continues to require transfusion.  # Hypoxic respiratory failure:  Remains intubated.  Per CCM.  # Elevated LFTs: Likely acute liver failure s/p arrest and hypotension.  Now on pressors.  Per primary team.   # Seizures:  On Keppra    Time spent: 40 minutes-Greater than 50% of this time was spent in counseling, explanation of diagnosis, planning of further management, and coordination of care.   For questions or updates, please contact [place] Please consult [email or web address] for contact info under        Signed, Skeet Latch, MD  02/27/2020, 8:15 AM

## 2020-02-27 NOTE — Op Note (Signed)
[facility] Patient Name: Colton Guzman Procedure Date: 02/27/2020 MRN: [id number] Attending MD: Otis Brace , MD Date of Birth: December 19, 1960 CSN: [id number] Age: 60 Admit Type: Inpatient Procedure:                Colonoscopy Indications:              This is the patient's first colonoscopy, Rectal                            bleeding Providers:                Otis Brace, MD, Kary Kos RN, RN,                            Nelia Shi, RN, Laverda Sorenson, Technician Referring MD:              Medicines:                 Complications:            No immediate complications. Estimated Blood Loss:     Estimated blood loss was minimal. Procedure:                Pre-Anesthesia Assessment:                           - Prior to the procedure, a History and Physical                            was performed, and patient medications and                            allergies were reviewed. The patient's tolerance of                            previous anesthesia was also reviewed. The risks                            and benefits of the procedure and the sedation                            options and risks were discussed with the patient.                            All questions were answered, and informed consent                            was obtained. Prior Anticoagulants: The patient has                            taken heparin, last dose was day of procedure. ASA                            Grade Assessment: IV - A patient with severe  systemic disease that is a constant threat to life.                            After reviewing the risks and benefits, the patient                            was deemed in satisfactory condition to undergo the                            procedure.                           After obtaining informed consent, the colonoscope                            was passed under direct vision. Throughout the                             procedure, the patient's blood pressure, pulse, and                            oxygen saturations were monitored continuously. The                            [id number]-H190DL ([id number] Olympus pediatric colonscope                            was introduced through the anus and advanced to the                            the cecum, identified by appendiceal orifice and                            ileocecal valve. The colonoscopy was technically                            difficult and complex due to poor endoscopic                            visualization. Successful completion of the                            procedure was aided by lavage. The patient                            tolerated the procedure well. The quality of the                            bowel preparation was fair. Scope In: 12:29:55 PM Scope Out: 1:04:12 PM Scope Withdrawal Time: 0 hours 15 minutes 2 seconds  Total Procedure Duration: 0 hours 34 minutes 17 seconds  Findings:      Hemorrhoids were found on perianal exam.      Hematin (altered blood/coffee-ground-like material) was found in the  entire colon. Visualization was difficult because of multiple large       blood clots. There was evidence of recent bleeding but I was not able to       identify any active bleeding source despite of thorough lavage. Most       likely diverticular bleed.      Clotted blood was found in the entire colon.      Red blood was found in the entire colon.      Multiple small and large-mouthed diverticula were found in the entire       colon.      Internal hemorrhoids were found during retroflexion. The hemorrhoids       were small. Impression:               - Preparation of the colon was fair.                           - Hemorrhoids found on perianal exam.                           - Blood in the entire examined colon.                           - Blood in the entire examined colon.                           - Blood in the  entire examined colon.                           - Diverticulosis in the entire examined colon.                           - Internal hemorrhoids.                           - No specimens collected. Moderate Sedation:      Moderate (conscious) sedation was ICU Nurse. The following parameters       were monitored: oxygen saturation, heart rate, blood pressure, and       response to care. Recommendation:           - Return patient to ICU for ongoing care.                           - NPO.                           - Continue present medications.                           - Do a GI bleeding (tagged RBC) scan at appointment                            to be scheduled. Procedure Code(s):        --- Professional ---                           [phone], Colonoscopy, flexible; diagnostic, including  collection of specimen(s) by brushing or washing,                            when performed (separate procedure) Diagnosis Code(s):        --- Professional ---                           K64.8, Other hemorrhoids                           K92.2, Gastrointestinal hemorrhage, unspecified                           K62.5, Hemorrhage of anus and rectum                           K57.30, Diverticulosis of large intestine without                            perforation or abscess without bleeding CPT copyright 2019 [employer]. All rights reserved. The codes documented in this report are preliminary and upon coder review may  be revised to meet current compliance requirements. Otis Brace, MD Otis Brace, MD 02/27/2020 1:31:28 PM Number of Addenda: 0

## 2020-02-27 NOTE — Progress Notes (Signed)
[facility] Progress Note Patient Name: Colton Guzman DOB: 1960-07-18 MRN: [id number]   Date of Service  02/27/2020  HPI/Events of Note  Hypocalcemia - Ca++ = 7.4 which corrects to 8.6 (slightly Low) given Albumin = 2.5.   eICU Interventions  Will replace Ca++.      Intervention Category Major Interventions: Electrolyte abnormality - evaluation and management  Emelee Rodocker Eugene 02/27/2020, 2:58 AM

## 2020-02-27 NOTE — Progress Notes (Signed)
eLink Physician-Brief Progress Note Patient Name: Colton Guzman DOB: 08-Jan-1961 MRN: [id number]   Date of Service  02/27/2020  HPI/Events of Note  Hypotension - BP = 98/43 with MAP = 59.  eICU Interventions  Plan: 1. Bolus with 0.9 NaCl 1 liter IV over 1 hour now.     Intervention Category Major Interventions: Hypotension - evaluation and management  [name] 02/27/2020, 3:33 AM

## 2020-02-27 NOTE — Progress Notes (Signed)
eLink Physician-Brief Progress Note Patient Name: Colton Guzman DOB: March 28, 1960 MRN: [id number]   Date of Service  02/27/2020  HPI/Events of Note  Received call from Dr. Melvyn Novas who recommended switching the patient from versed-based sedation to Precedex since he is on PSV rather than mandatory / AC mode.  He also recommended more frequent H/H checks (Q6H).   eICU Interventions  Will check CBC Q12H and H/H Q12H in staggered manner such that Hgb is checked Q6H in effect.  Discontinued versed infusion. Ordered Precedex infusion.      Intervention Category Major Interventions: Other:  Charlott Rakes 02/27/2020, 8:51 PM

## 2020-02-27 NOTE — Progress Notes (Signed)
Pt with massive amount of BRBPR noted with multiple clots. Heparin gtt stopped. Pt placed in trendelenburg position and tube feeding placed on hold. Elink notified, labs drawn.  Update: Hgb came back at 7.2. Additional huge clots noted. Elink notified, orders to transfuse received.

## 2020-02-28 ENCOUNTER — Inpatient Hospital Stay (HOSPITAL_COMMUNITY): Payer: Managed Care, Other (non HMO)

## 2020-02-28 ENCOUNTER — Encounter (HOSPITAL_COMMUNITY): Payer: Self-pay | Admitting: Gastroenterology

## 2020-02-28 DIAGNOSIS — D62 Acute posthemorrhagic anemia: Secondary | ICD-10-CM | POA: Diagnosis not present

## 2020-02-28 DIAGNOSIS — K922 Gastrointestinal hemorrhage, unspecified: Secondary | ICD-10-CM | POA: Diagnosis not present

## 2020-02-28 DIAGNOSIS — R509 Fever, unspecified: Secondary | ICD-10-CM

## 2020-02-28 DIAGNOSIS — R579 Shock, unspecified: Secondary | ICD-10-CM

## 2020-02-28 DIAGNOSIS — J9601 Acute respiratory failure with hypoxia: Secondary | ICD-10-CM | POA: Diagnosis not present

## 2020-02-28 LAB — TYPE AND SCREEN
ABO/RH(D): O POS
Antibody Screen: NEGATIVE
Unit division: 0
Unit division: 0
Unit division: 0
Unit division: 0

## 2020-02-28 LAB — CULTURE, BLOOD (ROUTINE X 2)
Culture: NO GROWTH
Culture: NO GROWTH
Special Requests: ADEQUATE
Special Requests: ADEQUATE

## 2020-02-28 LAB — BASIC METABOLIC PANEL
Anion gap: 10 (ref 5–15)
BUN: 12 mg/dL (ref 6–20)
CO2: 21 mmol/L — ABNORMAL LOW (ref 22–32)
Calcium: 7.4 mg/dL — ABNORMAL LOW (ref 8.9–10.3)
Chloride: 109 mmol/L (ref 98–111)
Creatinine, Ser: 0.78 mg/dL (ref 0.61–1.24)
GFR, Estimated: >60 mL/min (ref >60)
Glucose, Bld: 197 mg/dL — ABNORMAL HIGH (ref 70–99)
Potassium: 3.1 mmol/L — ABNORMAL LOW (ref 3.5–5.1)
Sodium: 140 mmol/L (ref 135–145)

## 2020-02-28 LAB — CBC
HCT: 28.1 % — ABNORMAL LOW (ref 39.0–52.0)
HCT: 25.9 % — ABNORMAL LOW (ref 39.0–52.0)
Hemoglobin: 9.3 g/dL — ABNORMAL LOW (ref 13.0–17.0)
Hemoglobin: 8.6 g/dL — ABNORMAL LOW (ref 13.0–17.0)
MCH: 30 pg (ref 26.0–34.0)
MCH: 30.3 pg (ref 26.0–34.0)
MCHC: 33.1 g/dL (ref 30.0–36.0)
MCHC: 33.2 g/dL (ref 30.0–36.0)
MCV: 90.6 fL (ref 80.0–100.0)
MCV: 91.2 fL (ref 80.0–100.0)
Platelets: 99 10*3/uL — ABNORMAL LOW (ref 150–400)
Platelets: 107 10*3/uL — ABNORMAL LOW (ref 150–400)
RBC: 3.1 MIL/uL — ABNORMAL LOW (ref 4.22–5.81)
RBC: 2.84 MIL/uL — ABNORMAL LOW (ref 4.22–5.81)
RDW: 16.2 % — ABNORMAL HIGH (ref 11.5–15.5)
RDW: 16.5 % — ABNORMAL HIGH (ref 11.5–15.5)
WBC: 8 10*3/uL (ref 4.0–10.5)
WBC: 9.5 10*3/uL (ref 4.0–10.5)
nRBC: 1.6 % — ABNORMAL HIGH (ref 0.0–0.2)
nRBC: 1.4 % — ABNORMAL HIGH (ref 0.0–0.2)

## 2020-02-28 LAB — BPAM RBC
Blood Product Expiration Date: 202202082359
Blood Product Expiration Date: 202202082359
Blood Product Expiration Date: 202202102359
Blood Product Expiration Date: 202202102359
ISSUE DATE / TIME: 202201080449
ISSUE DATE / TIME: 202201080543
ISSUE DATE / TIME: 202201081256
ISSUE DATE / TIME: 202201081637
Unit Type and Rh: 5100
Unit Type and Rh: 5100
Unit Type and Rh: 5100
Unit Type and Rh: 5100

## 2020-02-28 LAB — GLUCOSE, CAPILLARY
Glucose-Capillary: 135 mg/dL — ABNORMAL HIGH (ref 70–99)
Glucose-Capillary: 156 mg/dL — ABNORMAL HIGH (ref 70–99)
Glucose-Capillary: 171 mg/dL — ABNORMAL HIGH (ref 70–99)
Glucose-Capillary: 171 mg/dL — ABNORMAL HIGH (ref 70–99)
Glucose-Capillary: 180 mg/dL — ABNORMAL HIGH (ref 70–99)
Glucose-Capillary: 187 mg/dL — ABNORMAL HIGH (ref 70–99)

## 2020-02-28 LAB — HEMOGLOBIN AND HEMATOCRIT, BLOOD
HCT: 28.9 % — ABNORMAL LOW (ref 39.0–52.0)
HCT: 26.9 % — ABNORMAL LOW (ref 39.0–52.0)
Hemoglobin: 9.6 g/dL — ABNORMAL LOW (ref 13.0–17.0)
Hemoglobin: 8.9 g/dL — ABNORMAL LOW (ref 13.0–17.0)

## 2020-02-28 LAB — PROCALCITONIN: Procalcitonin: 0.77 ng/mL

## 2020-02-28 MED ORDER — POTASSIUM CHLORIDE 10 MEQ/50ML IV SOLN
10.0000 meq | INTRAVENOUS | Status: AC
  Administered 2020-02-28 (×4): 10 meq via INTRAVENOUS
  Filled 2020-02-28 (×4): qty 50

## 2020-02-28 MED ORDER — POTASSIUM CHLORIDE 20 MEQ PO PACK
20.0000 meq | PACK | ORAL | Status: AC
  Administered 2020-02-28 (×2): 20 meq
  Filled 2020-02-28 (×2): qty 1

## 2020-02-28 MED ORDER — LACTATED RINGERS IV SOLN: INTRAVENOUS | Status: DC

## 2020-02-28 MED ORDER — FENTANYL CITRATE (PF) 100 MCG/2ML IJ SOLN
25.0000 ug | INTRAMUSCULAR | Status: DC | PRN
  Administered 2020-02-29 (×2): 25 ug via INTRAVENOUS

## 2020-02-28 NOTE — Progress Notes (Signed)
NAMEKemauri Guzman, MRN:  [id number], DOB:  1960/12/24, LOS: 6 ADMISSION DATE:  02/22/2020, CONSULTATION DATE:  02/23/20 REFERRING MD:  Dr Bonner Puna, CHIEF COMPLAINT:  DKA, lethargy   Brief History:  60 year old man with hypertension, diabetes admitted 1/3 with painless hematochezia / apparent lower GI bleeding, leukocytosis. Hgb 13.2 >> 11.6 with IVF. CT abdomen with angiography 1/3 did not identify active bleeding source. Has had several more episodes of BRBPR since arrival and admission.  On 1/4 he had an episode of flatus and then belching with chest discomfort.  He was treated with NTG.  Then developed VT arrest with 31mn CPR with ROSC.  Labs consistent with evolving hyperglycemia and metabolic acidosis.  To ICU 1/4 pm.  IV access placed, Aline.  Required vasopressors, rising troponin, possible cardiogenic shock.   Past Medical History:  DM  HTN   Significant Hospital Events:  1/03 Admit  1/04 Tx to SDU with GI blood loss, hyperglycemia, metabolic acidosis.  Pt c/o chest discomfort around 1745, received nitroglycerin x1.  Recurrent chest discomfort and then VT on monitor. CPR approximately 2 minutes. ET intubated by CRNA, received etomidate and succinylcholine.  Not yet on sedation. Had large-volume bright red blood per rectum at the time of arrest. 1/05 On vasopressors, climbing troponin, no further bleeding. Improved on levophed. Cardiology consulted 1/06 PSV wean 5/5, off pressors 1/8 >  Hep off at 1 am for brb pr  And colonosopy showed lots of BRB but no source, endo nl   Consults:  GI Cardiology   Procedures:  ETT 1/4 >>  R IJ TLC 1/4 >>  Significant Diagnostic Tests:   CT abdomen/pelvis 1/3 >> hepatic steatosis, cholelithiasis and absence of GB or ductal abnormality, normal pancreas, normal ureters, mild circumferential thickening of the descending colon question infectious or inflammatory colitis with scattered diverticula no evidence of GI blood loss.  Bilateral fat-containing  inguinal hernias  EEG 1/6  This study is suggestive of moderate to severe diffuse encephalopathy, nonspecific etiology. No seizures or epileptiform discharges were seen throughout the recording  Echo 1/6  EF 50-55% wall motion abnormalities suspicious for mid LAD Grade II diastolic dysfunction  (pseudonormalization). Elevated left atrial pressure moderately elevated pulmonary artery systolic  pressure. The estimated right ventricular systolic pressure is 403.5mmHg.  LA mildly dilated Moderate MV regurgitation  Mild to moderate aortic valve sclerosis/calcification is  present, without any evidence of aortic stenosis The mid and distal anterior wall, mid and distal anterior septum, and apex  are hypokinetic  Upper and lower Endo 1/8 Blood in colon, no source identified, rec Nuclear study for prob divertilar source    Micro Data:  SARS CoV2 1/3 >> negative Influenza A/B 1/3 >> negative  MRSA  PCR  1/4 >  Neg  BC x 2 1/4>>  UA 1/5 >> rare bacteria/ not cultured Tracheal aspirate 1/5 >> abundant WBC >> neg BC x 2  1/7 >>>   Antimicrobials:  Zosyn 1/4 >>  Vanc 1/6 >> 1/8 last dose    Scheduled Meds: . chlorhexidine gluconate (MEDLINE KIT)  15 mL Mouth Rinse BID  . Chlorhexidine Gluconate Cloth  6 each Topical Q0600  . docusate  100 mg Per Tube BID  . feeding supplement (PROSource TF)  45 mL Per Tube TID  . free water  100 mL Per Tube Q6H  . insulin aspart  0-9 Units Subcutaneous Q4H  . insulin glargine  10 Units Subcutaneous BID  . mouth rinse  15 mL Mouth Rinse  10 times per day  . pantoprazole (PROTONIX) IV  40 mg Intravenous Q12H  . polyethylene glycol  17 g Per Tube Daily  . potassium chloride  20 mEq Per Tube Q4H  . sodium chloride flush  10-40 mL Intracatheter Q12H   Continuous Infusions: . dexmedetomidine (PRECEDEX) IV infusion 0.2 mcg/kg/hr (02/28/20 0600)  . dextrose 5% lactated ringers 10 mL/hr at 02/28/20 0600  . feeding supplement (OSMOLITE 1.5 CAL) Stopped  (02/27/20 0120)  . fentaNYL infusion INTRAVENOUS 25 mcg/hr (02/28/20 0600)  . levETIRAcetam Stopped (02/28/20 0355)  . norepinephrine (LEVOPHED) Adult infusion Stopped (02/27/20 0900)  . piperacillin-tazobactam (ZOSYN)  IV 3.375 g (02/28/20 0532)  . potassium chloride 10 mEq (02/28/20 0721)  . vancomycin Stopped (02/27/20 2301)   PRN Meds:.alum & mag hydroxide-simeth, fentaNYL, LORazepam, nitroGLYCERIN, ondansetron **OR** ondansetron (ZOFRAN) IV, sodium chloride flush   Interim History / Subjective:  Off fentanyl this am, just on precedex now  decreaesed uop overnight though good uop last 24/ off pressors     Objective   Blood pressure 110/60, pulse 72, temperature 99.9 F (37.7 C), temperature source Bladder, resp. rate 20, height _0  (1.727 m), weight 99.5 kg, SpO2 98 %. CVP:  [5 mmHg-15 mmHg] 15 mmHg  Vent Mode: PSV;CPAP FiO2 (%):  [30 %] 30 % PEEP:  [5 cmH20] 5 cmH20 Pressure Support:  [10 cmH20] 10 cmH20 Plateau Pressure:  [18 cmH20] 18 cmH20   Intake/Output Summary (Last 24 hours) at 02/28/2020 0811 Last data filed at 02/28/2020 0600 Gross per 24 hour  Intake 1839.75 ml  Output 1885 ml  Net -45.25 ml   Filed Weights   02/27/20 0345 02/28/20 0030 02/28/20 0330  Weight: 94.2 kg 99.5 kg 99.5 kg   CVP:  [5 mmHg-15 mmHg] 15 mmHg    Examination: Tmax  100.6  On cooling blanket but not on Pt opens eyes not tracking, variably blinks to confrontation  No jvd Oropharynx et /og  inplace Neck supple Lungs with a few scattered exp rhonchi bilaterally RRR no s3 or or sign murmur - holding nsr off amiodarone Abd mod distended  Nl excursion / og clamped  Extr warm with no edema or clubbing noted/ pas in place Neuro  Moves all 4 spont but not f/c or resp to pain      I personally reviewed images and  impression as follows:  CXR:   Portable 1/9  Overall improved R more so than L with hazy infiltrates still present    Resolved Hospital Problem list     Assessment &  Plan:   S/p VT Arrest 1/4 , in the setting of acute GI blood loss, presumed hyporperfusion.  ECG just prior and then post resuscitation with anterior ST elevations. A Fib + RVR post resuscitation> back to SR p amiodarone d/c'd 1/7  ? Cardiogenic Shock CHADSVASC - 2   - off pressors since 1/8  - troponin peak 15,553  -will likely need diagnostic cath once stabilized/ cards following    Acute Hypoxic Respiratory Failure and mechanical ventilation post-arrest Suspected Aspiration due to Cardiac Arrest  Acute Pulmonary Edema   - does better on PSV mode > ? Can we extubate today ?     Jerking suspicious for seizure activity vs myoclonus s/p arrest  - EEG 1/7  negative for seizures but c/w TME  - continue Keppra empirically  - changed over entirely to precedex am 1/9 to see if cortical function improves enough to extubate      Fever  Possible etiologies include MI, aspiration post arrest, GI source/ CNS (anoxic injury)   -consider PICC placement once patient is afebrile x 24-48 hours  >>>  D/c'd vanc 1/10 as mrsa pcr neg/ Et neg for mrsa also   Diabetes Mellitus 2 with DKA/HHS Initially required insulin infusion, transitioned off 1/5  -CBG goal 140-180  -SSI, sensitive  Scale plus lantus 10 units bid   - holding home metformin, glyburide   Anion gap metabolic acidosis.   Due to DKA and also component of this was lactic acidosis, was improved with volume resuscitation and treatment of his DKA -  Resolved   Hypophosphatemia  -monitor, replace as indicated   Acute GI bleed, appears lower based on history, hematochezia ? Cirrhosis - s/p upper and lower endoscopy 1/8 no source identified, presume tic   Lab Results  Component Value Date   HGB 9.3 (L) 02/28/2020   HGB 9.0 (L) 02/27/2020   HGB 7.8 (L) 02/27/2020    -transfuse for Hgb <8% with cardiac concerns  - TF on hold  - Trending  LFT's  Colonic thickening, possible inflammatory versus infectious colitis by CT abdomen.   Associated with leukocytosis -continue abx per flow sheet - egd 1/8 neg for colitis or source of gib    Thrombocytopenia  Lab Results  Component Value Date   PLT 99 (L) 02/28/2020   PLT 118 (L) 02/27/2020   PLT 130 (L) 02/27/2020    Fatty liver noted on CT, ? Underlying cirrhosis  -follow platelets     Hx Hypertension -holding home hyzaar, metoprolol, amlodipine   Hyperlipidemia - hold home atorvastatin for now - Trending LFT's and resume if normalize   Best practice (evaluated daily)  Diet: NPO / TF  Pain/Anxiety/Delirium protocol (if indicated): orecedex drip  VAP protocol (if indicated): In place  DVT prophylaxis: SCD, no anticoagulation GI prophylaxis: PPI  Glucose control: SSI plus lantus  Mobility: BR Disposition: ICU    Goals of Care:  Last date of multidisciplinary goals of care discussion:  at length with wife 1/8  Family and staff present: Wife Summary of discussion:  Pending  Follow up goals of care discussion due: Daily updates Code Status: Full code  Labs   CBC: Recent Labs  Lab 02/23/20 0432 02/23/20 0435 02/23/20 2049 02/24/20 0000 02/26/20 0200 02/26/20 1341 02/26/20 1753 02/26/20 2306 02/27/20 0145 02/27/20 0700 02/27/20 1030 02/27/20 2200 02/28/20 0512  WBC 19.7*   < > 16.3*  20.1*   < > 15.2*  --  12.6*  --  12.1*  --  10.7*  --  8.0  NEUTROABS 14.9*  --  10.0*  --   --   --   --   --  9.6*  --   --   --   --   HGB 9.9*   < > 9.6*  7.2*  7.4*   < > 8.3*   < > 8.1*   < > 7.2* 8.0* 7.8* 9.0* 9.3*  HCT 30.6*   < > 28.1*  20.6*  23.1*   < > 25.2*   < > 25.2*   < > 22.0* 24.8* 24.0* 27.1* 28.1*  MCV 96.8   < > 88.6  92.0   < > 93.3  --  94.7  --  94.8  --  90.9  --  90.6  PLT 191   < > 87*  96*   < > 118*  --  113*  --  130*  --  118*  --  99*   < > = values in this interval not displayed.    Basic Metabolic Panel: Recent Labs  Lab 02/24/20 1124 02/25/20 0439 02/25/20 1014 02/25/20 1743 02/26/20 0200 02/26/20 1753  02/27/20 0145 02/28/20 0512  NA 132* 133*  --   --  134*  --  134* 140  K 4.4 3.6  --   --  3.9  --  3.2* 3.1*  CL 103 104  --   --  100  --  103 109  CO2 20* 23  --   --  23  --  17* 21*  GLUCOSE 334* 130*  --   --  127*  --  199* 197*  BUN 20 15  --   --  13  --  19 12  CREATININE 0.94 0.75  --   --  0.77  --  0.72 0.78  CALCIUM 7.4* 7.6*  --   --  7.8*  --  7.4* 7.4*  MG  --  2.0 2.1 1.8 1.9 2.1 1.9  --   PHOS  --  1.9* 2.0* 2.7 2.5 2.0*  --   --    GFR: Estimated Creatinine Clearance: 116.4 mL/min (by C-G formula based on SCr of 0.78 mg/dL). Recent Labs  Lab 02/23/20 0435 02/23/20 0534 02/23/20 1113 02/23/20 2049 02/24/20 0000 02/24/20 0020 02/24/20 0432 02/24/20 1124 02/24/20 1249 02/25/20 0439 02/25/20 0440 02/26/20 0200 02/26/20 1753 02/27/20 0145 02/27/20 1030 02/28/20 0512  PROCALCITON 0.29  --   --   --   --   --  0.22  --   --   --  0.36 0.46  --   --   --   --   WBC  --   --  18.8* 16.3*  20.1*   < >  --  17.5*   < >  --    < >  --  15.2* 12.6* 12.1* 10.7* 8.0  LATICACIDVEN  --    < > 5.0* 6.4*  --  2.1*  --   --  3.2*  --   --   --   --   --   --   --    < > = values in this interval not displayed.    Liver Function Tests: Recent Labs  Lab 02/22/20 1215 02/22/20 2107 02/23/20 2049 02/26/20 0200 02/27/20 0145  AST 29 29 41 756* 320*  ALT 43 <5 37 675* 536*  ALKPHOS 69 44 30* 54 57  BILITOT 1.1 0.9 0.5 1.2 2.1*  PROT 8.3* 6.1* 4.4* 6.4* 5.4*  ALBUMIN 4.1 3.1* 2.5* 3.2* 2.5*   Recent Labs  Lab 02/23/20 0450  LIPASE 53*   Recent Labs  Lab 02/23/20 2049  AMMONIA 29    ABG    Component Value Date/Time   PHART 7.453 (H) 02/27/2020 0936   PCO2ART 26.0 (L) 02/27/2020 0936   PO2ART 66.8 (L) 02/27/2020 0936   HCO3 17.9 (L) 02/27/2020 0936   ACIDBASEDEF 4.7 (H) 02/27/2020 0936   O2SAT 94.2 02/27/2020 0936     Coagulation Profile: Recent Labs  Lab 02/22/20 2104 02/23/20 2049 02/27/20 0143  INR 1.3* 1.5* 1.3*    Cardiac  Enzymes: No results for input(s): CKTOTAL, CKMB, CKMBINDEX, TROPONINI in the last 168 hours.  HbA1C: Hgb A1c MFr Bld  Date/Time Value Ref Range Status  02/22/2020 09:04 PM 10.3 (H) 4.8 - 5.6 % Final    Comment:    (NOTE) Pre diabetes:  5.7%-6.4%  Diabetes:              >6.4%  Glycemic control for   <7.0% adults with diabetes     CBG: Recent Labs  Lab 02/27/20 1603 02/27/20 2025 02/27/20 2357 02/28/20 0500 02/28/20 0730  GLUCAP 159* 157* 132* 171* 187*      The patient is critically ill with multiple organ systems failure and requires high complexity decision making for assessment and support, frequent evaluation and titration of therapies, application of advanced monitoring technologies and extensive interpretation of multiple databases. Critical Care Time devoted to patient care services described in this note is 45  minutes.     Christinia Gully, MD Pulmonary and [place] [phone]   After 7:00 pm call Elink  [phone]

## 2020-02-28 NOTE — Progress Notes (Signed)
[facility] Progress Note  Colton Guzman 60 y.o. [birth year]  CC: Rectal bleeding, cardiac arrest, respiratory failure   Subjective: Patient remains intubated.  No acute overnight events.  No family at bedside.  Discussed with RN.  No further bleeding episodes.  Hemoglobin stable  ROS : Not able to obtain.   Objective: Vital signs in last 24 hours: Vitals:   02/28/20 0900 02/28/20 1000  BP: 123/62 (!) 89/53  Pulse: 67 65  Resp: 19 19  Temp:    SpO2: 97% 97%    Physical Exam:  General-remains intubated. Coarse breath sounds.  Anterior lung exam only. Abdomen minimally distended, bowel sounds present.  No peritoneal signs. Neuro.  Not able to obtain Psych.  Not able to obtain Lab Results: Recent Labs    02/26/20 0200 02/26/20 1753 02/27/20 0145 02/28/20 0512  NA 134*  --  134* 140  K 3.9  --  3.2* 3.1*  CL 100  --  103 109  CO2 23  --  17* 21*  GLUCOSE 127*  --  199* 197*  BUN 13  --  19 12  CREATININE 0.77  --  0.72 0.78  CALCIUM 7.8*  --  7.4* 7.4*  MG 1.9 2.1 1.9  --   PHOS 2.5 2.0*  --   --    Recent Labs    02/26/20 0200 02/27/20 0145  AST 756* 320*  ALT 675* 536*  ALKPHOS 54 57  BILITOT 1.2 2.1*  PROT 6.4* 5.4*  ALBUMIN 3.2* 2.5*   Recent Labs    02/27/20 0145 02/27/20 0700 02/27/20 1030 02/27/20 2200 02/28/20 0512  WBC 12.1*  --  10.7*  --  8.0  NEUTROABS 9.6*  --   --   --   --   HGB 7.2*   < > 7.8* 9.0* 9.3*  HCT 22.0*   < > 24.0* 27.1* 28.1*  MCV 94.8  --  90.9  --  90.6  PLT 130*  --  118*  --  99*   < > = values in this interval not displayed.   Recent Labs    02/27/20 0143  LABPROT 16.1*  INR 1.3*      Assessment/Plan:  Assessment  ---------------  -Rectal bleeding with CT scan showing mild circumferential wall thickening of the descending colon. Patient with history of diarrhea in the last week of December.    Colonoscopy on January 8 showed no evidence of colitis.  Most likely diverticular  bleed  Had few more episodes of rectal bleeding while on heparin drip around midnight on February 27, 2020.  Heparin was reversed.  Received 2 units of blood transfusion.  Heparin drip was stopped at around 1 AM February 27, 2020.   -Abnormal LFTs.  Most likely ischemic hepatitis from hypotension.  I Would not be surprised if T bili goes up in the next few days.      -Cardiac arrest with V. tach requiring resuscitation. Currently intubated.   A Fib + RVR post resuscitation .  Converted back to sinus rhythm  -Elevated troponins. ? Demand ischemia  -Acute blood loss anemia.   -DKA  -Lactic acidosis    EGD  1/8 negative for bleeding or any ulcer disease.  Mild gastritis noted. -Colonoscopy 1/8 showed evidence of recent bleeding.  Visualization was difficult because of multiple blood clots.  I was not able to identify any active bleeding source.  Most likely diverticular bleeding.   Recommendations  -------------------------  -Most likely diverticular bleed.  Appears  to be resolved now.  No further bleeding episodes and hence bleeding scan was not done. -Recommend to continue to hold anticoagulation for now -Monitor H&H.  Transfuse if hemoglobin less than 8 -Continue other supportive care -Okay to extubate from GI standpoint -GI will follow  Otis Brace MD, FACP 02/28/2020, 10:06 AM  Contact #  [phone]

## 2020-02-28 NOTE — Progress Notes (Signed)
[facility] Progress Note Patient Name: Colton Guzman DOB: 07/27/1960 MRN: [id number]   Date of Service  02/28/2020  HPI/Events of Note  RN instructed to titrate precedex @ 0.7 for sedation and Pt is bradycardic 50's. Fentanyl drip off and only using bolus' from bag. RN asking for addition sedation instead of increasing precedex more  eICU Interventions  Discussed with Mandy/RN Planning for extubation in AM.  fenta 25 mcg  q2 hr prn ordered for 3 doses. Told to avoid it as much as possible.      Intervention Category Intermediate Interventions: Other:  Elmer Sow 02/28/2020, 10:17 PM

## 2020-02-29 ENCOUNTER — Telehealth: Payer: Self-pay | Admitting: Internal Medicine

## 2020-02-29 DIAGNOSIS — J9601 Acute respiratory failure with hypoxia: Secondary | ICD-10-CM | POA: Diagnosis not present

## 2020-02-29 DIAGNOSIS — K922 Gastrointestinal hemorrhage, unspecified: Secondary | ICD-10-CM | POA: Diagnosis not present

## 2020-02-29 LAB — GLUCOSE, CAPILLARY
Glucose-Capillary: 144 mg/dL — ABNORMAL HIGH (ref 70–99)
Glucose-Capillary: 152 mg/dL — ABNORMAL HIGH (ref 70–99)
Glucose-Capillary: 155 mg/dL — ABNORMAL HIGH (ref 70–99)
Glucose-Capillary: 155 mg/dL — ABNORMAL HIGH (ref 70–99)
Glucose-Capillary: 156 mg/dL — ABNORMAL HIGH (ref 70–99)
Glucose-Capillary: 203 mg/dL — ABNORMAL HIGH (ref 70–99)

## 2020-02-29 LAB — HEMOGLOBIN AND HEMATOCRIT, BLOOD
HCT: 27.8 % — ABNORMAL LOW (ref 39.0–52.0)
Hemoglobin: 9.2 g/dL — ABNORMAL LOW (ref 13.0–17.0)

## 2020-02-29 LAB — PREPARE FRESH FROZEN PLASMA
Unit division: 0
Unit division: 0
Unit division: 0
Unit division: 0
Unit division: 0
Unit division: 0
Unit division: 0

## 2020-02-29 LAB — CBC
HCT: 29.8 % — ABNORMAL LOW (ref 39.0–52.0)
HCT: 28.9 % — ABNORMAL LOW (ref 39.0–52.0)
Hemoglobin: 9.8 g/dL — ABNORMAL LOW (ref 13.0–17.0)
Hemoglobin: 9.3 g/dL — ABNORMAL LOW (ref 13.0–17.0)
MCH: 30.3 pg (ref 26.0–34.0)
MCH: 29.8 pg (ref 26.0–34.0)
MCHC: 32.9 g/dL (ref 30.0–36.0)
MCHC: 32.2 g/dL (ref 30.0–36.0)
MCV: 92.3 fL (ref 80.0–100.0)
MCV: 92.6 fL (ref 80.0–100.0)
Platelets: 126 10*3/uL — ABNORMAL LOW (ref 150–400)
Platelets: 135 10*3/uL — ABNORMAL LOW (ref 150–400)
RBC: 3.23 MIL/uL — ABNORMAL LOW (ref 4.22–5.81)
RBC: 3.12 MIL/uL — ABNORMAL LOW (ref 4.22–5.81)
RDW: 17.2 % — ABNORMAL HIGH (ref 11.5–15.5)
RDW: 17.9 % — ABNORMAL HIGH (ref 11.5–15.5)
WBC: 11.7 10*3/uL — ABNORMAL HIGH (ref 4.0–10.5)
WBC: 11 10*3/uL — ABNORMAL HIGH (ref 4.0–10.5)
nRBC: 1.2 % — ABNORMAL HIGH (ref 0.0–0.2)
nRBC: 1.1 % — ABNORMAL HIGH (ref 0.0–0.2)

## 2020-02-29 LAB — BPAM FFP
Blood Product Expiration Date: 202201092359
Blood Product Expiration Date: 202201092359
Blood Product Expiration Date: 202201092359
Blood Product Expiration Date: 202201092359
Blood Product Expiration Date: 202201152359
Blood Product Expiration Date: 202201152359
Blood Product Expiration Date: 202201152359
Blood Product Expiration Date: 202201172359
ISSUE DATE / TIME: 202201040610
ISSUE DATE / TIME: 202201040610
ISSUE DATE / TIME: 202201040620
ISSUE DATE / TIME: 202201040620
ISSUE DATE / TIME: 202201040649
ISSUE DATE / TIME: 202201040649
ISSUE DATE / TIME: 202201040651
ISSUE DATE / TIME: 202201040651
Unit Type and Rh: 5100
Unit Type and Rh: 5100
Unit Type and Rh: 5100
Unit Type and Rh: 600
Unit Type and Rh: 600
Unit Type and Rh: 6200
Unit Type and Rh: 6200
Unit Type and Rh: 9500

## 2020-02-29 LAB — BASIC METABOLIC PANEL
Anion gap: 10 (ref 5–15)
BUN: 15 mg/dL (ref 6–20)
CO2: 19 mmol/L — ABNORMAL LOW (ref 22–32)
Calcium: 7.6 mg/dL — ABNORMAL LOW (ref 8.9–10.3)
Chloride: 110 mmol/L (ref 98–111)
Creatinine, Ser: 0.74 mg/dL (ref 0.61–1.24)
GFR, Estimated: >60 mL/min (ref >60)
Glucose, Bld: 179 mg/dL — ABNORMAL HIGH (ref 70–99)
Potassium: 3.1 mmol/L — ABNORMAL LOW (ref 3.5–5.1)
Sodium: 139 mmol/L (ref 135–145)

## 2020-02-29 MED ORDER — POTASSIUM CHLORIDE 20 MEQ PO PACK
40.0000 meq | PACK | Freq: Every day | ORAL | Status: DC
  Administered 2020-02-29: 40 meq
  Filled 2020-02-29: qty 2

## 2020-02-29 MED ORDER — SODIUM CHLORIDE 0.9 % IV SOLN
INTRAVENOUS | Status: DC | PRN
  Administered 2020-02-29: 250 mL via INTRAVENOUS

## 2020-02-29 MED ORDER — POTASSIUM CHLORIDE 10 MEQ/50ML IV SOLN
10.0000 meq | INTRAVENOUS | Status: AC
  Administered 2020-02-29 (×4): 10 meq via INTRAVENOUS
  Filled 2020-02-29 (×4): qty 50

## 2020-02-29 MED ORDER — MIDAZOLAM HCL 2 MG/2ML IJ SOLN
INTRAMUSCULAR | Status: AC
  Administered 2020-02-29: 2 mg
  Filled 2020-02-29: qty 2

## 2020-02-29 MED ORDER — MIDAZOLAM HCL 2 MG/2ML IJ SOLN
1.0000 mg | INTRAMUSCULAR | Status: DC | PRN
  Administered 2020-03-01 (×2): 2 mg via INTRAVENOUS
  Filled 2020-02-29 (×2): qty 2

## 2020-02-29 MED ORDER — ASPIRIN 81 MG PO CHEW
81.0000 mg | CHEWABLE_TABLET | Freq: Once | ORAL | Status: AC
  Administered 2020-02-29: 81 mg
  Filled 2020-02-29: qty 1

## 2020-02-29 MED ORDER — POTASSIUM CHLORIDE 20 MEQ PO PACK
20.0000 meq | PACK | ORAL | Status: AC
  Administered 2020-02-29: 20 meq
  Filled 2020-02-29: qty 1

## 2020-02-29 MED ORDER — MAGNESIUM SULFATE 2 GM/50ML IV SOLN
2.0000 g | Freq: Once | INTRAVENOUS | Status: AC
  Administered 2020-02-29: 2 g via INTRAVENOUS
  Filled 2020-02-29: qty 50

## 2020-02-29 NOTE — Plan of Care (Signed)
Problem: Education: Goal: Knowledge of General Education information will improve Description: Including pain rating scale, medication(s)/side effects and non-pharmacologic comfort measures 02/29/2020 0312 by Selinda Michaels, RN Outcome: Progressing 02/29/2020 0302 by Selinda Michaels, RN Outcome: Progressing   Problem: Health Behavior/Discharge Planning: Goal: Ability to manage health-related needs will improve 02/29/2020 0312 by Selinda Michaels, RN Outcome: Progressing 02/29/2020 0302 by Selinda Michaels, RN Outcome: Progressing   Problem: Clinical Measurements: Goal: Ability to maintain clinical measurements within normal limits will improve 02/29/2020 0312 by Selinda Michaels, RN Outcome: Progressing 02/29/2020 0302 by Selinda Michaels, RN Outcome: Progressing Goal: Will remain free from infection 02/29/2020 0312 by Selinda Michaels, RN Outcome: Progressing 02/29/2020 0302 by Selinda Michaels, RN Outcome: Progressing Goal: Diagnostic test results will improve 02/29/2020 0312 by Selinda Michaels, RN Outcome: Progressing 02/29/2020 0302 by Selinda Michaels, RN Outcome: Progressing Goal: Respiratory complications will improve 02/29/2020 0312 by Selinda Michaels, RN Outcome: Progressing 02/29/2020 0302 by Selinda Michaels, RN Outcome: Progressing Goal: Cardiovascular complication will be avoided 02/29/2020 0312 by Selinda Michaels, RN Outcome: Progressing 02/29/2020 0302 by Selinda Michaels, RN Outcome: Progressing   Problem: Activity: Goal: Risk for activity intolerance will decrease 02/29/2020 0312 by Selinda Michaels, RN Outcome: Progressing 02/29/2020 0302 by Selinda Michaels, RN Outcome: Progressing   Problem: Nutrition: Goal: Adequate nutrition will be maintained 02/29/2020 0312 by Selinda Michaels, RN Outcome: Progressing 02/29/2020 0302 by Selinda Michaels, RN Outcome: Progressing   Problem: Coping: Goal: Level of anxiety will decrease 02/29/2020 0312 by Selinda Michaels, RN Outcome: Progressing 02/29/2020 0302 by Selinda Michaels, RN Outcome: Progressing   Problem: Elimination: Goal: Will not experience complications related to bowel motility 02/29/2020 0312 by Selinda Michaels, RN Outcome: Progressing 02/29/2020 0302 by Selinda Michaels, RN Outcome: Progressing Goal: Will not experience complications related to urinary retention 02/29/2020 0312 by Selinda Michaels, RN Outcome: Progressing 02/29/2020 0302 by Selinda Michaels, RN Outcome: Progressing   Problem: Pain Managment: Goal: General experience of comfort will improve 02/29/2020 0312 by Selinda Michaels, RN Outcome: Progressing 02/29/2020 0302 by Selinda Michaels, RN Outcome: Progressing   Problem: Safety: Goal: Ability to remain free from injury will improve 02/29/2020 0312 by Selinda Michaels, RN Outcome: Progressing 02/29/2020 0302 by Selinda Michaels, RN Outcome: Progressing   Problem: Skin Integrity: Goal: Risk for impaired skin integrity will decrease 02/29/2020 0312 by Selinda Michaels, RN Outcome: Progressing 02/29/2020 0302 by Selinda Michaels, RN Outcome: Progressing   Problem: Education: Goal: Ability to identify signs and symptoms of gastrointestinal bleeding will improve 02/29/2020 0312 by Selinda Michaels, RN Outcome: Progressing 02/29/2020 0302 by Selinda Michaels, RN Outcome: Progressing   Problem: Bowel/Gastric: Goal: Will show no signs and symptoms of gastrointestinal bleeding 02/29/2020 0312 by Selinda Michaels, RN Outcome: Progressing 02/29/2020 0302 by Selinda Michaels, RN Outcome: Progressing   Problem: Fluid Volume: Goal: Will show no signs and symptoms of excessive bleeding 02/29/2020 0312 by Selinda Michaels, RN Outcome: Progressing 02/29/2020 0302 by Selinda Michaels, RN Outcome: Progressing   Problem: Clinical Measurements: Goal: Complications related to the disease process, condition or treatment will be avoided or minimized 02/29/2020 0312 by  Selinda Michaels, RN Outcome: Progressing 02/29/2020 0302 by Selinda Michaels, RN Outcome: Progressing   Problem: Education: Goal: Ability to identify signs and symptoms of gastrointestinal bleeding will improve 02/29/2020 0312 by Selinda Michaels, RN Outcome: Progressing 02/29/2020  0302 by Selinda Michaels, RN Outcome: Progressing   Problem: Bowel/Gastric: Goal: Will show no signs and symptoms of gastrointestinal bleeding 02/29/2020 0312 by Selinda Michaels, RN Outcome: Progressing 02/29/2020 0302 by Selinda Michaels, RN Outcome: Progressing   Problem: Fluid Volume: Goal: Will show no signs and symptoms of excessive bleeding 02/29/2020 0312 by Selinda Michaels, RN Outcome: Progressing 02/29/2020 0302 by Selinda Michaels, RN Outcome: Progressing   Problem: Clinical Measurements: Goal: Complications related to the disease process, condition or treatment will be avoided or minimized 02/29/2020 0312 by Selinda Michaels, RN Outcome: Progressing 02/29/2020 0302 by Selinda Michaels, RN Outcome: Progressing   Problem: Education: Goal: Ability to describe self-care measures that may prevent or decrease complications (Diabetes Survival Skills Education) will improve 02/29/2020 0312 by Selinda Michaels, RN Outcome: Progressing 02/29/2020 0302 by Selinda Michaels, RN Outcome: Progressing Goal: Individualized Educational Video(s) 02/29/2020 0312 by Selinda Michaels, RN Outcome: Progressing 02/29/2020 0302 by Selinda Michaels, RN Outcome: Progressing   Problem: Cardiac: Goal: Ability to maintain an adequate cardiac output will improve 02/29/2020 0312 by Selinda Michaels, RN Outcome: Progressing 02/29/2020 0302 by Selinda Michaels, RN Outcome: Progressing   Problem: Health Behavior/Discharge Planning: Goal: Ability to identify and utilize available resources and services will improve 02/29/2020 0312 by Selinda Michaels, RN Outcome: Progressing 02/29/2020 0302 by Selinda Michaels,  RN Outcome: Progressing Goal: Ability to manage health-related needs will improve 02/29/2020 0312 by Selinda Michaels, RN Outcome: Progressing 02/29/2020 0302 by Selinda Michaels, RN Outcome: Progressing   Problem: Fluid Volume: Goal: Ability to achieve a balanced intake and output will improve 02/29/2020 0312 by Selinda Michaels, RN Outcome: Progressing 02/29/2020 0302 by Selinda Michaels, RN Outcome: Progressing   Problem: Metabolic: Goal: Ability to maintain appropriate glucose levels will improve 02/29/2020 0312 by Selinda Michaels, RN Outcome: Progressing 02/29/2020 0302 by Selinda Michaels, RN Outcome: Progressing   Problem: Nutritional: Goal: Maintenance of adequate nutrition will improve 02/29/2020 0312 by Selinda Michaels, RN Outcome: Progressing 02/29/2020 0302 by Selinda Michaels, RN Outcome: Progressing Goal: Maintenance of adequate weight for body size and type will improve 02/29/2020 0312 by Selinda Michaels, RN Outcome: Progressing 02/29/2020 0302 by Selinda Michaels, RN Outcome: Progressing   Problem: Respiratory: Goal: Will regain and/or maintain adequate ventilation 02/29/2020 0312 by Selinda Michaels, RN Outcome: Progressing 02/29/2020 0302 by Selinda Michaels, RN Outcome: Progressing   Problem: Urinary Elimination: Goal: Ability to achieve and maintain adequate renal perfusion and functioning will improve 02/29/2020 0312 by Selinda Michaels, RN Outcome: Progressing 02/29/2020 0302 by Selinda Michaels, RN Outcome: Progressing   Problem: Safety: Goal: Non-violent Restraint(s) 02/29/2020 0312 by Selinda Michaels, RN Outcome: Progressing 02/29/2020 0302 by Selinda Michaels, RN Outcome: Progressing   Problem: Activity: Goal: Ability to tolerate increased activity will improve 02/29/2020 0312 by Selinda Michaels, RN Outcome: Progressing 02/29/2020 0302 by Selinda Michaels, RN Outcome: Progressing   Problem: Respiratory: Goal: Ability to maintain a clear  airway and adequate ventilation will improve 02/29/2020 0312 by Selinda Michaels, RN Outcome: Progressing 02/29/2020 0302 by Selinda Michaels, RN Outcome: Progressing   Problem: Role Relationship: Goal: Method of communication will improve 02/29/2020 0312 by Selinda Michaels, RN Outcome: Progressing 02/29/2020 0302 by Selinda Michaels, RN Outcome: Progressing

## 2020-02-29 NOTE — Progress Notes (Signed)
Stopped Fentanyl to facilitate second effort at wake up assessment.

## 2020-02-29 NOTE — Plan of Care (Signed)
Patient has been back on Fentanyl at lowest dose since approximately 0100 to help meet RASS goals since precedex cannot be safely titrated higher due to bradycardia in 50's. On call elink RN & MD aware and via e-link camera observed patient with RASS at 3. Patient has copious secretions in ETT that may contribute to agitation. RT also aware and assessing. Unable to maintain Rass without Fentanyl and boluses at this time.

## 2020-02-29 NOTE — Progress Notes (Addendum)
[facility] Progress Note  Colton Guzman 60 y.o. 10-Feb-1961  CC:  Rectal bleeding  Subjective: Patient remains intubated and sedated.  Per chart review, small volume hematochezia early this morning, darker blood (not bright red).  ROS : Review of Systems  Unable to perform ROS: Intubated   Objective: Vital signs in last 24 hours: Vitals:   02/29/20 0700 02/29/20 0843  BP: 111/69   Pulse: (!) 56   Resp: (!) 24   Temp: 99.9 F (37.7 C)   SpO2: 95% 100%    Physical Exam:  General:  Ill-appearing, intubated and sedated, opens eyes intermittently  Head:  Normocephalic, without obvious abnormality, atraumatic  Lungs:   Mechanical ventilation, coarse lung sounds bilaterally  Heart:  Mildly bradycardic, regular rhythm  Abdomen:   Soft with mild distention. No grimace with palpation. Bowel sounds active all four quadrants.  Extremities: SCDs in place    Lab Results: Recent Labs    02/26/20 1753 02/27/20 0145 02/27/20 0145 02/28/20 0512 02/29/20 0230  NA  --  134*   < > 140 139  K  --  3.2*   < > 3.1* 3.1*  CL  --  103   < > 109 110  CO2  --  17*   < > 21* 19*  GLUCOSE  --  199*   < > 197* 179*  BUN  --  19   < > 12 15  CREATININE  --  0.72   < > 0.78 0.74  CALCIUM  --  7.4*   < > 7.4* 7.6*  MG 2.1 1.9  --   --   --   PHOS 2.0*  --   --   --   --    < > = values in this interval not displayed.   Recent Labs    02/27/20 0145  AST 320*  ALT 536*  ALKPHOS 57  BILITOT 2.1*  PROT 5.4*  ALBUMIN 2.5*   Recent Labs    02/27/20 0145 02/27/20 0700 02/28/20 1650 02/28/20 2230 02/29/20 0230  WBC 12.1*   < > 9.5  --  11.7*  NEUTROABS 9.6*  --   --   --   --   HGB 7.2*   < > 8.6* 8.9* 9.8*  HCT 22.0*   < > 25.9* 26.9* 29.8*  MCV 94.8   < > 91.2  --  92.3  PLT 130*   < > 107*  --  126*   < > = values in this interval not displayed.   Recent Labs    02/27/20 0143  LABPROT 16.1*  INR 1.3*    Assessment: Painless hematochezia, most likely  diverticular bleeding.  Colonoscopy 1/8 revealed hematin (altered blood/coffee-ground-like material) was found in the entire colon. Visualization was difficult because of multiple large blood clots. There was evidence of recent bleeding but I was not able to identify any active bleeding source despite of thorough lavage, most likely diverticular bleed.  No evidence of colitis (CT-A 1/3 showed mild circumferential wall thickening in descending colon).  EGD with mild gastritis, otherwise normal.   -Hgb 9.8, stable.  Patient had 4u pRBCs on 1/8 but has not required transfusion since that time  V Tach cardiac arrest s/p cardioversion and CPR, troponin 12,216 on 1/5.    A Fib with RVR post-resuscitation  Acute respiratory failure, mechanically ventilated  Elevated LFTs, most likely ischemic hepatitis.  AST/ALT trending down, delayed rise in T. Bili.  Today, AST 320/ ALT 536/ T.  Bili 2.1   Plan: Continue supportive care.  If ongoing slow bleeding, consider nuclear bleeding scan.  If destabilizing bleeding, recommend CT-A followed by IR consultation if positive.  Continue to hold anticoagulation for now.  Continue to monitor H&H with transfusion as needed to maintain Hgb >8.  OK to extubate from GI standpoint.  [facility] will follow.  Addendum: Cardiology would like to initiate ASA, OK from a GI standpoint.  Would continue Protonix if ASA initiated.  Salley Slaughter PA-C 02/29/2020, 9:07 AM  Contact #  [phone]

## 2020-02-29 NOTE — Progress Notes (Signed)
NAMEDecklyn Guzman, MRN:  [id number], DOB:  [birth year], LOS: 7 ADMISSION DATE:  02/22/2020, CONSULTATION DATE:  02/23/20 REFERRING MD:  Dr Bonner Puna, CHIEF COMPLAINT:  DKA, lethargy   Brief History:  60 year old man with hypertension, diabetes admitted 1/3 with painless hematochezia / apparent lower GI bleeding, leukocytosis. Hgb 13.2 >> 11.6 with IVF. CT abdomen with angiography 1/3 did not identify active bleeding source. Has had several more episodes of BRBPR since arrival and admission.  On 1/4 he had an episode of flatus and then belching with chest discomfort.  He was treated with NTG.  Then developed VT arrest with 47min CPR with ROSC.  Labs consistent with evolving hyperglycemia and metabolic acidosis.  To ICU 1/4 pm.  IV access placed, Aline.  Required vasopressors, rising troponin, possible cardiogenic shock.   Past Medical History:  DM  HTN   Significant Hospital Events:  1/03 Admit  1/04 Tx to SDU with GI blood loss, hyperglycemia, metabolic acidosis.  Pt c/o chest discomfort around 1745, received nitroglycerin x1.  Recurrent chest discomfort and then VT on monitor. CPR approximately 2 minutes. ET intubated by CRNA, received etomidate and succinylcholine.  Not yet on sedation. Had large-volume bright red blood per rectum at the time of arrest. 1/05 On vasopressors, climbing troponin, no further bleeding. Improved on levophed. Cardiology consulted 1/06 PSV wean 5/5, off pressors 1/8 >  Hep off at 1 am for brb pr  And colonosopy showed lots of BRB but no source, endo nl   Consults:  GI Cardiology   Procedures:  ETT 1/4 >>  R IJ TLC 1/4 >>  Significant Diagnostic Tests:   CT abdomen/pelvis 1/3 >> hepatic steatosis, cholelithiasis and absence of GB or ductal abnormality, normal pancreas, normal ureters, mild circumferential thickening of the descending colon question infectious or inflammatory colitis with scattered diverticula no evidence of GI blood loss.  Bilateral fat-containing  inguinal hernias  EEG 1/6  This study is suggestive of moderate to severe diffuse encephalopathy, nonspecific etiology. No seizures or epileptiform discharges were seen throughout the recording  Echo 1/6  EF 50-55% wall motion abnormalities suspicious for mid LAD Grade II diastolic dysfunction  (pseudonormalization). Elevated left atrial pressure moderately elevated pulmonary artery systolic  pressure. The estimated right ventricular systolic pressure is 65.7 mmHg.  LA mildly dilated Moderate MV regurgitation  Mild to moderate aortic valve sclerosis/calcification is  present, without any evidence of aortic stenosis The mid and distal anterior wall, mid and distal anterior septum, and apex  are hypokinetic  Upper and lower Endo 1/8 Blood in colon, no source identified, rec Nuclear study for prob divertilar source    Micro Data:  SARS CoV2 1/3 >> negative Influenza A/B 1/3 >> negative  MRSA  PCR  1/4 >  Neg  BC x 2 1/4>>  UA 1/5 >> rare bacteria/ not cultured Tracheal aspirate 1/5 >> abundant WBC >> neg BC x 2  1/7 >>>   Antimicrobials:  Zosyn 1/4 >>  Vanc 1/6 >> 1/8 last dose    Scheduled Meds: . chlorhexidine gluconate (MEDLINE KIT)  15 mL Mouth Rinse BID  . Chlorhexidine Gluconate Cloth  6 each Topical Q0600  . docusate  100 mg Per Tube BID  . feeding supplement (PROSource TF)  45 mL Per Tube TID  . free water  100 mL Per Tube Q6H  . insulin aspart  0-9 Units Subcutaneous Q4H  . insulin glargine  10 Units Subcutaneous BID  . mouth rinse  15 mL Mouth Rinse  10 times per day  . pantoprazole (PROTONIX) IV  40 mg Intravenous Q12H  . polyethylene glycol  17 g Per Tube Daily  . potassium chloride  20 mEq Per Tube Q4H  . sodium chloride flush  10-40 mL Intracatheter Q12H   Continuous Infusions: . sodium chloride Stopped (02/29/20 0538)  . dexmedetomidine (PRECEDEX) IV infusion 0.5 mcg/kg/hr (02/29/20 0700)  . feeding supplement (OSMOLITE 1.5 CAL) Stopped (02/27/20 0120)   . fentaNYL infusion INTRAVENOUS Stopped (02/29/20 0647)  . lactated ringers Stopped (02/29/20 0626)  . levETIRAcetam Stopped (02/29/20 0143)  . piperacillin-tazobactam (ZOSYN)  IV 12.5 mL/hr at 02/29/20 0700   PRN Meds:.sodium chloride, alum & mag hydroxide-simeth, fentaNYL, fentaNYL (SUBLIMAZE) injection, nitroGLYCERIN, ondansetron **OR** ondansetron (ZOFRAN) IV, sodium chloride flush   Interim History / Subjective:    02/29/2020 - remains on vent. Wife at bedside. Low grade fever + on abx. 30% fio2, on , precedex gtt, Fent gtt off x few hours. On TF.  Per RT  - does not cough when she suctions.  Wife is upset about his mental status but is somewhat reasured compared to yesterday. She is distraught at his lack of speedy recovery. Wondering if the hospital has facilities to help him get better. Wondering why RBC scan was not done. Wondering if 1gm% dropi in Hgb was active bleeding. Says he was fully vibrant and man of devout faith  Objective   Blood pressure 111/69, pulse (!) 56, temperature 99.9 F (37.7 C), temperature source Bladder, resp. rate (!) 24, height 5\' 8"  (1.727 m), weight 96 kg, SpO2 95 %. CVP:  [8 mmHg-14 mmHg] 10 mmHg  Vent Mode: CPAP;PSV FiO2 (%):  [30 %] 30 % PEEP:  [5 cmH20] 5 cmH20 Pressure Support:  [8 cmH20] 8 cmH20   Intake/Output Summary (Last 24 hours) at 02/29/2020 0847 Last data filed at 02/29/2020 0700 Gross per 24 hour  Intake 2611.45 ml  Output 600 ml  Net 2011.45 ml   Filed Weights   02/28/20 0030 02/28/20 0330 02/29/20 0406  Weight: 99.5 kg 99.5 kg 96 kg   CVP:  [8 mmHg-14 mmHg] 10 mmHg    Examination: General Appearance:  Looks criticall ill OBESE - + Head:  Normocephalic, without obvious abnormality, atraumatic Eyes:  PERRL - yes, conjunctiva/corneas - muddy     Ears:  Normal external ear canals, both ears Nose:  G tube - no Throat:  ETT TUBE - yes , OG tube - yes Neck:  Supple,  No enlargement/tenderness/nodules Lungs: Clear to auscultation  bilaterally, Ventilator   Synchrony - yes Heart:  S1 and S2 normal, no murmur, CVP - x.  Pressors - no Abdomen:  Soft, no masses, no organomegaly Genitalia / Rectal:  Not done Extremities:  Extremities- intact Skin:  ntact in exposed areas . Sacral area - not examin4d Neurologic:  Sedation - precedxex  -> RASS - -3 . Moves all 4s - yes. CAM-ICU - cannot assess . Orientation - cannot asess. Wife thinks he is beginning to communicate        Resolved Hospital Problem list    Anion gap metabolic acidosis.   Due to DKA and also component of this was lactic acidosis, was improved with volume resuscitation and treatment of his DKA -  Resolved   Assessment & Plan:   S/p VT Arrest 1/4 , in the setting of acute GI blood loss, presumed hyporperfusion.  ECG just prior and then post resuscitation with anterior ST elevations. A Fib + RVR post resuscitation> back to  SR p amiodarone d/c'd 1/7  ? Cardiogenic Shock  02/29/2020 - not on pressors. Heparing gtt on hold due to GIB recurrentct. HR 60 and sinus  CHADSVASC - 2   - off pressors since 1/8  - troponin peak 15,553  -will likely need diagnostic cath once stabilized/ cards following    Acute Hypoxic Respiratory Failure and mechanical ventilation post-arrest Suspected Aspiration due to Cardiac Arrest  Acute Pulmonary Edema     02/29/2020 - > does yes probbaly meet criteria for SBT  setting of Acute Respiratory Failure due to LGI bleed and cardiac arrest  Plan  - try SBT off precedex - assess for extubation in few hours    Jerking suspicious for seizure activity vs myoclonus s/p arrest  - EEG 1/7  negative for seizures but c/w TME  - continue Keppra empirically  - changed over entirely to precedex am 1/9 to see if cortical function improves enough to extubate      02/29/2020 - no seizures. Possibly more arousbablre  Plan  - monitor - keppra  Fever  Possible etiologies include MI, aspiration post arrest, GI source/ CNS (anoxic  injury)   -consider PICC placement once patient is afebrile x 24-48 hours  >>>  D/c'd vanc 1/10 as mrsa pcr neg/ Et neg for mrsa also   02/29/2020 - still febrile  Plan  - moniotrr    Acute GI bleed, appears lower based on history, hematochezia - Had VT arrest on 1/4 in setting of ongoing GIB. Started on heparin gtt 1/6 evening without bolus after clearance from GI. However,  had recurrent bleeding midnight on 1/8. Stopped heparin 1AM on 1/8. Received transfusion.   - CT-A 1/3 without colitiis other than mild wall thickeing of descending color - s/p upper and lower endoscopy 1/8 no source identified other than mild gastritis, presume tic - per GI 1/10 ? diveritcular bleed   02/29/2020 - No active bleeding. Last PRBC 4 units 02/27/20  Plan   - - PRBC for hgb </= 6.9gm%    - exceptions are   -  if ACS susepcted/confirmed then transfuse for hgb </= 8.0gm%,  or    -  active bleeding with hemodynamic instability, then transfuse regardless of hemoglobin value   At at all times try to transfuse 1 unit prbc as possible with exception of active hemorrhage  - Nuclear blood scan if concern for ongoing bleeding per GI (wife distratugh this never happened, had to explain ratioinale)  - abx per flow ssheet for colitis mild   Thrombocytopenia  Lab Results  Component Value Date   PLT 126 (L) 02/29/2020   PLT 107 (L) 02/28/2020   PLT 99 (L) 02/28/2020    Fatty liver noted on CT, ? Underlying cirrhosis   02/29/2020  - improving  plan -follow platelets    ELectrolyte imbalance  02/29/2020   K low, Mag was <2 a few days ago  Plan  - repelte K  - give mag   Diabetes Mellitus 2 with DKA/HHS Initially required insulin infusion, transitioned off 1/5  -CBG goal 140-180  -SSI, sensitive  Scale plus lantus 10 units bid   - holding home metformin, glyburide   Hx Hypertension -holding home hyzaar, metoprolol, amlodipine   Hyperlipidemia - hold home atorvastatin for now - Trending LFT's  and resume if normalize   Best practice (evaluated daily)  Diet: NPO / TF  Pain/Anxiety/Delirium protocol (if indicated): orecedex drip  VAP protocol (if indicated): In place  DVT prophylaxis: SCD, no  anticoagulation GI prophylaxis: PPI  Glucose control: SSI plus lantus  Mobility: BR Disposition: ICU    Goals of Care:  Last date of multidisciplinary goals of care discussion:  at length with wife 1/8  Family and staff present: Wife Summary of discussion:  Pending  Follow up goals of care discussion due: Daily updates Code Status: Full code    Multi-Disciplinary Goals of Care Discussion Date of Discussion 02/29/2020 and is Day 7 since admit  Primary service for patient ccm3  Location of discussion Bedside 1231  Family and Staff present Nurse Alroy Dust, wife, Dr Chase Caller  Summary of discussion She is very distraught husband is ill and pace of recovery is slow. Explained. Will do SBT and asess for extubation (had to repeateldy explain that we are doing an asesment iwht intent to extubate and we are not yet committting to extubation).  She wants to talk to GI  Followup goals of care due by   Misc comments if any Will see if we can extend spiritual support to wife while addressing medical issues  Explained to wife geeneral nature of ICU care and tried to set expectations that it could be weeks beforfe he can go home  Offered transfer to anotehr facility if they desired          ATTESTATION & SIGNATURE   The patient Colton Guzman is critically ill with multiple organ systems failure and requires high complexity decision making for assessment and support, frequent evaluation and titration of therapies, application of advanced monitoring technologies and extensive interpretation of multiple databases.   Critical Care Time devoted to patient care services described in this note is  60  Minutes. This time reflects time of care of this signee Dr Brand Males. This critical care  time does not reflect procedure time, or teaching time or supervisory time of PA/NP/Med student/Med Resident etc but could involve care discussion time     Dr. Brand Males, M.D., [facility].C.P Pulmonary and Critical Care Medicine Staff Physician [facility] Pager: [phone], If no answer or between  15:00h - 7:00h: call [phone]  02/29/2020 8:51 AM     LABS    PULMONARY Recent Labs  Lab 02/23/20 0450 02/23/20 2129 02/24/20 1124 02/24/20 1440 02/24/20 1819 02/25/20 0814 02/27/20 0936  PHART  --  7.383  --   --   --   --  7.453*  PCO2ART  --  34.0  --   --   --   --  26.0*  PO2ART  --  238*  --   --   --   --  66.8*  HCO3 12.0* 19.8*  --   --   --   --  17.9*  O2SAT 13.6 99.8 50.0 55.7 53.9 97.6 94.2    CBC Recent Labs  Lab 02/28/20 0512 02/28/20 1100 02/28/20 1650 02/28/20 2230 02/29/20 0230  HGB 9.3*   < > 8.6* 8.9* 9.8*  HCT 28.1*   < > 25.9* 26.9* 29.8*  WBC 8.0  --  9.5  --  11.7*  PLT 99*  --  107*  --  126*   < > = values in this interval not displayed.    COAGULATION Recent Labs  Lab 02/22/20 2104 02/23/20 2049 02/27/20 0143  INR 1.3* 1.5* 1.3*    CARDIAC  No results for input(s): TROPONINI in the last 168 hours. No results for input(s): PROBNP in the last 168 hours.  CHEMISTRY Recent Labs  Lab 02/25/20 0439 02/25/20 1014 02/25/20 1743 02/26/20 0200 02/26/20 1753 02/27/20 0145 02/28/20 0512 02/29/20 0230  NA 133*  --   --  134*  --  134* 140 139  K 3.6  --   --  3.9  --  3.2* 3.1* 3.1*  CL 104  --   --  100  --  103 109 110  CO2 23  --   --  23  --  17* 21* 19*  GLUCOSE 130*  --   --  127*  --  199* 197* 179*  BUN 15  --   --  13  --  $R'19 12 15  'EE$ CREATININE 0.75  --   --  0.77  --  0.72 0.78 0.74  CALCIUM 7.6*  --   --  7.8*  --  7.4* 7.4* 7.6*  MG 2.0 2.1 1.8 1.9 2.1 1.9  --   --   PHOS 1.9* 2.0* 2.7 2.5 2.0*  --   --   --    Estimated Creatinine Clearance: 114.4 mL/min (by  C-G formula based on SCr of 0.74 mg/dL).   LIVER Recent Labs  Lab 02/22/20 1215 02/22/20 2104 02/22/20 2107 02/23/20 2049 02/26/20 0200 02/27/20 0143 02/27/20 0145  AST 29  --  29 41 756*  --  320*  ALT 43  --  <5 37 675*  --  536*  ALKPHOS 69  --  44 30* 54  --  57  BILITOT 1.1  --  0.9 0.5 1.2  --  2.1*  PROT 8.3*  --  6.1* 4.4* 6.4*  --  5.4*  ALBUMIN 4.1  --  3.1* 2.5* 3.2*  --  2.5*  INR  --  1.3*  --  1.5*  --  1.3*  --      INFECTIOUS Recent Labs  Lab 02/23/20 2049 02/24/20 0020 02/24/20 0432 02/24/20 1249 02/25/20 0440 02/26/20 0200 02/28/20 0512  LATICACIDVEN 6.4* 2.1*  --  3.2*  --   --   --   PROCALCITON  --   --    < >  --  0.36 0.46 0.77   < > = values in this interval not displayed.     ENDOCRINE CBG (last 3)  Recent Labs    02/28/20 2358 02/29/20 0344 02/29/20 0731  GLUCAP 171* 155* 152*         IMAGING x48h  - image(s) personally visualized  -   highlighted in bold DG Abd 1 View  Result Date: 02/27/2020 CLINICAL DATA:  Status post feeding tube placement EXAM: ABDOMEN - 1 VIEW COMPARISON:  February 26, 2020 at 2:37 a.m. FINDINGS: The side port and distal tip of the NG tube in the stomach. No feeding tube identified. IMPRESSION: A feeding tube is not identified. NG tube terminates in the stomach. Electronically Signed   By: Dorise Bullion III M.D   On: 02/27/2020 17:16   DG Chest Port 1 View  Result Date: 02/28/2020 CLINICAL DATA:  Acute respiratory failure with hypoxemia. EXAM: PORTABLE CHEST 1 VIEW COMPARISON:  Chest x-rays dated 02/27/2020 and 02/26/2020. FINDINGS: Support apparatus appears stable in position. Heart size and mediastinal contours are stable. Patchy bilateral airspace opacities are not significantly changed. No pleural effusion or pneumothorax is seen. IMPRESSION: Stable chest x-ray. Patchy bilateral airspace opacities, not significantly changed, compatible with multifocal pneumonia versus pulmonary edema. Favor pulmonary  edema. Electronically Signed   By: Franki Cabot M.D.   On: 02/28/2020 09:06

## 2020-02-29 NOTE — Progress Notes (Addendum)
Progress Note  Patient Name: Colton Guzman Date of Encounter: 02/29/2020  [facility] Cardiologist: Freada Bergeron, MD   Subjective   Remain on ventilator. No family at bedside.   Inpatient Medications    Scheduled Meds: . chlorhexidine gluconate (MEDLINE KIT)  15 mL Mouth Rinse BID  . Chlorhexidine Gluconate Cloth  6 each Topical Q0600  . docusate  100 mg Per Tube BID  . feeding supplement (PROSource TF)  45 mL Per Tube TID  . free water  100 mL Per Tube Q6H  . insulin aspart  0-9 Units Subcutaneous Q4H  . insulin glargine  10 Units Subcutaneous BID  . mouth rinse  15 mL Mouth Rinse 10 times per day  . pantoprazole (PROTONIX) IV  40 mg Intravenous Q12H  . polyethylene glycol  17 g Per Tube Daily  . potassium chloride  20 mEq Per Tube Q4H  . sodium chloride flush  10-40 mL Intracatheter Q12H   Continuous Infusions: . sodium chloride Stopped (02/29/20 0538)  . dexmedetomidine (PRECEDEX) IV infusion 0.5 mcg/kg/hr (02/29/20 0700)  . feeding supplement (OSMOLITE 1.5 CAL) Stopped (02/27/20 0120)  . fentaNYL infusion INTRAVENOUS Stopped (02/29/20 0647)  . lactated ringers Stopped (02/29/20 0626)  . levETIRAcetam Stopped (02/29/20 0143)  . piperacillin-tazobactam (ZOSYN)  IV 12.5 mL/hr at 02/29/20 0700   PRN Meds: sodium chloride, alum & mag hydroxide-simeth, fentaNYL, fentaNYL (SUBLIMAZE) injection, nitroGLYCERIN, ondansetron **OR** ondansetron (ZOFRAN) IV, sodium chloride flush   Vital Signs    Vitals:   02/29/20 0508 02/29/20 0600 02/29/20 0649 02/29/20 0700  BP: (!) 102/35 108/63  111/69  Pulse: 63 (!) 55 (!) 57 (!) 56  Resp: (!) 28 (!) 23 20 (!) 24  Temp:    99.9 F (37.7 C)  TempSrc:    Bladder  SpO2: 91% 96% 96% 95%  Weight:      Height:        Intake/Output Summary (Last 24 hours) at 02/29/2020 0810 Last data filed at 02/29/2020 0700 Gross per 24 hour  Intake 2873.1 ml  Output 600 ml  Net 2273.1 ml   Last 3 Weights 02/29/2020 02/28/2020 02/28/2020   Weight (lbs) 211 lb 10.3 oz 219 lb 5.7 oz 219 lb 5.7 oz  Weight (kg) 96 kg 99.5 kg 99.5 kg      Telemetry    Sinus bradycardia in 50s - Personally Reviewed  ECG    N/A  Physical Exam   GEN: Critically ill appearing male on ventilator   Neck: No JVD Cardiac: regular slow, no murmurs, rubs, or gallops.  Respiratory: course breath sound GI: distended  MS: No edema; No deformity. Neuro: on Ventiator Psych: on ventilator  Labs    High Sensitivity Troponin:   Recent Labs  Lab 02/23/20 2049 02/24/20 0432 02/24/20 1124 02/24/20 1803 02/24/20 2000  TROPONINIHS 178*  23* 12,216* 15,553* 12,157* 9,517*      Chemistry Recent Labs  Lab 02/23/20 2049 02/24/20 0000 02/26/20 0200 02/27/20 0145 02/28/20 0512 02/29/20 0230  NA 136  138   < > 134* 134* 140 139  K 3.0*  3.0*   < > 3.9 3.2* 3.1* 3.1*  CL 106  107   < > 100 103 109 110  CO2 19*  19*   < > 23 17* 21* 19*  GLUCOSE 291*  272*   < > 127* 199* 197* 179*  BUN 16  18   < > _0 CREATININE 0.77  0.83   < > 0.77  0.72 0.78 0.74  CALCIUM 7.3*  7.3*   < > 7.8* 7.4* 7.4* 7.6*  PROT 4.4*  --  6.4* 5.4*  --   --   ALBUMIN 2.5*  --  3.2* 2.5*  --   --   AST 41  --  756* 320*  --   --   ALT 37  --  675* 536*  --   --   ALKPHOS 30*  --  54 57  --   --   BILITOT 0.5  --  1.2 2.1*  --   --   GFRNONAA >60  >60   < > >60 >60 >60 >60  ANIONGAP 11  12   < > _0 < > = values in this interval not displayed.     Hematology Recent Labs  Lab 02/28/20 0512 02/28/20 1100 02/28/20 1650 02/28/20 2230 02/29/20 0230  WBC 8.0  --  9.5  --  11.7*  RBC 3.10*  --  2.84*  --  3.23*  HGB 9.3*   < > 8.6* 8.9* 9.8*  HCT 28.1*   < > 25.9* 26.9* 29.8*  MCV 90.6  --  91.2  --  92.3  MCH 30.0  --  30.3  --  30.3  MCHC 33.1  --  33.2  --  32.9  RDW 16.2*  --  16.5*  --  17.2*  PLT 99*  --  107*  --  126*   < > = values in this interval not displayed.    Radiology    DG Abd 1 View  Result Date:  02/27/2020 CLINICAL DATA:  Status post feeding tube placement EXAM: ABDOMEN - 1 VIEW COMPARISON:  February 26, 2020 at 2:37 a.m. FINDINGS: The side port and distal tip of the NG tube in the stomach. No feeding tube identified. IMPRESSION: A feeding tube is not identified. NG tube terminates in the stomach. Electronically Signed   By: Dorise Bullion III M.D   On: 02/27/2020 17:16   DG Chest Port 1 View  Result Date: 02/28/2020 CLINICAL DATA:  Acute respiratory failure with hypoxemia. EXAM: PORTABLE CHEST 1 VIEW COMPARISON:  Chest x-rays dated 02/27/2020 and 02/26/2020. FINDINGS: Support apparatus appears stable in position. Heart size and mediastinal contours are stable. Patchy bilateral airspace opacities are not significantly changed. No pleural effusion or pneumothorax is seen. IMPRESSION: Stable chest x-ray. Patchy bilateral airspace opacities, not significantly changed, compatible with multifocal pneumonia versus pulmonary edema. Favor pulmonary edema. Electronically Signed   By: Franki Cabot M.D.   On: 02/28/2020 09:06    Cardiac Studies   Echo 02/25/2020 1. LVEF 50-55% with wall motion abnormalities suspicious for mid LAD. No  apical thrombus.  2. Left ventricular ejection fraction, by estimation, is 50 to 55%. The  left ventricle has low normal function. The left ventricle demonstrates  regional wall motion abnormalities (see scoring diagram/findings for  description). Left ventricular diastolic  parameters are consistent with Grade II diastolic dysfunction  (pseudonormalization). Elevated left atrial pressure.  3. Right ventricular systolic function is normal. The right ventricular  size is normal. There is moderately elevated pulmonary artery systolic  pressure. The estimated right ventricular systolic pressure is 96.7 mmHg.  4. Left atrial size was mildly dilated.  5. The mitral valve is normal in structure. Moderate mitral valve  regurgitation. No evidence of mitral stenosis.  6.  The aortic valve is normal in structure. Aortic valve regurgitation is  not visualized. Mild to moderate  aortic valve sclerosis/calcification is  present, without any evidence of aortic stenosis.  7. The inferior vena cava is normal in size with greater than 50%  respiratory variability, suggesting right atrial pressure of 3 mmHg.   Patient Profile     60 y.o. male with a hx of HTN and DMwho is being seen for the evaluation ofVF arrest and Afib in setting of rectal bleeding.   Assessment & Plan    1. VT Arrest 2. NSTEMI 3. Possible cardiogenic shock   - Had VT arrest on 1/4 in setting of ongoing GIB. ROSC after 2 minutes of CPR and cardioversion. Troponin peaked >15K, then trending down. Had dynamic EKG changes post arrest (ST depression inferior and anterior lateral leads), resolved on last EKG.  -Echo showed LVEF of 50-55% with wall motion abnormalities suspicious for mid LAD. No apical thrombus. Grade II DD.  - Started on heparin gtt 1/6 evening without bolus after clearance from GI. However,  had recurrent bleeding midnight on 1/8. Stopped heparin 1AM on 1/8. Received 2 units of transfusion.  - Diagnostic cath once stabilized  - No BB or ACE/ARB while needing pressors.   4. Post arrest atrial fibrillation with RVR - Converted to sinus and maintaining sinus rhythm  - Not an anticoagulation candidate with ongoing GI bleed - Keep K > 4 and Mg > 4  5. GI blood 2nd to likely diverticular bleed - EKG 1/8 negative for bleeding or ulcer - Colonoscopy 1/8 with rectal bleeding and multiple clots. Felt likely due to diverticular bleed.  - Per GI  For questions or updates, please contact [place] Please consult [email or web address] for contact info under     Signed, Leanor Kail, PA  02/29/2020, 8:10 AM    History and all data above reviewed.  Patient examined.  I agree with the findings as above.   Coming out of anesthesia.  Seems to follow some commands. The patient exam  reveals COR:RRR  ,  Lungs: Decreased breath sounds at the dependent areas.   ,  Abd: Positive bowel sounds, no rebound no guarding, Ext diffuse edema.  .  All available labs, radiology testing, previous records reviewed. Agree with documented assessment and plan.   MI:  He will need a cardiac cath eventually.  However, the timing of this will be in flux as we cannot use anticoagulation until we know he is cleared by GI.  Need to find out from GI if/when we can start ASA.   Jeneen Rinks [facility]  9:45 AM  02/29/2020

## 2020-02-29 NOTE — Progress Notes (Signed)
Patient increasingly restless and agitated over last hour. Restarted Fentanyl for low dose and 25 mcg bolus dose to take edge off and get RASS back to goal. Decreased Precedex to balance and not oversedate. Continuing to monitor and will turn off Fentanyl as soon as possible as plan is to wean today after 0700. See MAR.

## 2020-02-29 NOTE — Progress Notes (Signed)
Rounding on pt due to spiritual care consult.    Spouse not present during chaplain rounds.  Consulted with RN around support needs.  Will continue to follow for support and forward to chaplain team.

## 2020-02-29 NOTE — TOC Initial Note (Addendum)
Transition of Care [facility]) - Initial/Assessment Note    Patient Details  Name: Colton Guzman MRN: [id number] Date of Birth: 07/06/1960  [facility] of [facility]) CM/SW Contact:    Leeroy Cha, RN Phone Number: 02/29/2020, 10:55 AM  Clinical Narrative:                 60 y.o. male with a history of HTN and T2DM on daily aspirin 81mg  who presented to the ED 02/22/2020 with painless hematochezia at ~3:45am that morning. He went to sleep in his usual state of health and awoke to spontaneous bleeding, had 3 more episodes of blood that appeared cherry color at first, now frank red with no admixed stool. This is constant, progressing, and becoming associated with lightheadedness and decreased exertional tolerance.   In the ED, +FOBT with some ongoing bleeding HR peaking in 120's but normotensive, afebrile, abd exam reportedly benign. Hgb was noted to be 13.2 g/dl (unknown baseline). 1L NS given, hgb down to 11.6g/dl and patient admitted to [facility] with GI consulted.  Never had screening colonoscopy, no GI doctor in [place]. Does not take other NSAIDs regularly, does not drink heavily, has no history of GI bleeding prior to this event, is not Jehovah's witness. Has had 2 doses of covid vaccine.  Review of Systems: Denies fever, chills, weight loss, changes in vision or hearing, headache, cough, sore throat, chest pain, palpitations, shortness of breath, abdominal pain, nausea, vomiting, change in bladder habits, myalgias, arthralgias, rash, and per HPI. All others reviewed and are negative.  PLAN TO RETURN TO HOME PROGRESSION: ON VENT AND SEDATED AT 30 FI02, IV LR AT 75CC/HR, CONTINUES TO HAVE SOME RECTAL BLEEDING.REC'D ONE UNIT FFP THIS AM. HGB 9.8, Expected Discharge Plan: Home/Self Care Barriers to Discharge: Continued Medical Work up   Patient Goals and CMS Choice Patient states their goals for this hospitalization and ongoing recovery are:: on vent unable to determine      Expected Discharge  Plan and Services Expected Discharge Plan: Home/Self Care   Discharge Planning Services: CM Consult   Living arrangements for the past 2 months: Single Family Home                                      Prior Living Arrangements/Services Living arrangements for the past 2 months: Single Family Home Lives with:: Spouse Patient language and need for interpreter reviewed:: Yes              Criminal Activity/Legal Involvement Pertinent to Current Situation/Hospitalization: No - Comment as needed  Activities of Daily Living Home Assistive Devices/Equipment: None ADL Screening (condition at time of admission) Patient's cognitive ability adequate to safely complete daily activities?: Yes Is the patient deaf or have difficulty hearing?: No Does the patient have difficulty seeing, even when wearing glasses/contacts?: No Does the patient have difficulty concentrating, remembering, or making decisions?: No Patient able to express need for assistance with ADLs?: Yes Does the patient have difficulty dressing or bathing?: No Independently performs ADLs?: Yes (appropriate for developmental age) Does the patient have difficulty walking or climbing stairs?: No Weakness of Legs: None Weakness of Arms/Hands: None  Permission Sought/Granted                  Emotional Assessment Appearance:: Appears stated age Attitude/Demeanor/Rapport: Unable to Assess Affect (typically observed): Unable to Assess Orientation: : Fluctuating Orientation (Suspected and/or reported Sundowners) Alcohol / Substance  Use: Not Applicable Psych Involvement: No (comment)  Admission diagnosis:  Lower GI bleed [K92.2] GI bleed due to NSAIDs [K92.2, T39.395A] Patient Active Problem List   Diagnosis Date Noted  . Shock circulatory ([place]) 02/27/2020  . Fever 02/27/2020  . Acute respiratory failure with hypoxia ([place])   . Cardiac arrest ([place])   . Acute lower GI bleeding 02/22/2020  . Acute blood loss  anemia 02/22/2020  . T2DM (type 2 diabetes mellitus) ([place]) 02/22/2020  . Hypertension   . Hyperlipidemia   . Gout    PCP:  London Pepper, MD Pharmacy:   [id number] - [place], [place] [address] Phone: [phone] Fax: [phone]     Social Determinants of Health (SDOH) Interventions    Readmission Risk Interventions No flowsheet data found.

## 2020-02-29 NOTE — Progress Notes (Signed)
eLink Physician-Brief Progress Note Patient Name: Colton Guzman DOB: 1960/09/27 MRN: [id number]   Date of Service  02/29/2020  HPI/Events of Note  RN titrating sedation as ordered and Pt still extremely agitated and asynchronous with vent. Please camera in she is having to hold him to keep in bed.  Camera: Mild rectal bleed, looks darker, not bright red. Stable vitals.  Discussed with RN. Back on fenta gtt due to agitation. On PEEP/CPAP 8.  eICU Interventions  - follow Hg due soon. Last Hg 8.9, stable. Diverticular bleeder. S/p scope.      Intervention Category Intermediate Interventions: Bleeding - evaluation and treatment with blood products  Elmer Sow 02/29/2020, 2:22 AM

## 2020-03-01 ENCOUNTER — Inpatient Hospital Stay (HOSPITAL_COMMUNITY): Payer: Managed Care, Other (non HMO)

## 2020-03-01 ENCOUNTER — Inpatient Hospital Stay: Payer: Self-pay

## 2020-03-01 DIAGNOSIS — Z452 Encounter for adjustment and management of vascular access device: Secondary | ICD-10-CM

## 2020-03-01 DIAGNOSIS — K922 Gastrointestinal hemorrhage, unspecified: Secondary | ICD-10-CM | POA: Diagnosis not present

## 2020-03-01 DIAGNOSIS — Z4659 Encounter for fitting and adjustment of other gastrointestinal appliance and device: Secondary | ICD-10-CM

## 2020-03-01 DIAGNOSIS — J9601 Acute respiratory failure with hypoxia: Secondary | ICD-10-CM | POA: Diagnosis not present

## 2020-03-01 LAB — BASIC METABOLIC PANEL
Anion gap: 9 (ref 5–15)
Anion gap: 13 (ref 5–15)
BUN: 13 mg/dL (ref 6–20)
BUN: 8 mg/dL (ref 6–20)
CO2: 21 mmol/L — ABNORMAL LOW (ref 22–32)
CO2: 27 mmol/L (ref 22–32)
Calcium: 7.5 mg/dL — ABNORMAL LOW (ref 8.9–10.3)
Calcium: 7.4 mg/dL — ABNORMAL LOW (ref 8.9–10.3)
Chloride: 108 mmol/L (ref 98–111)
Chloride: 102 mmol/L (ref 98–111)
Creatinine, Ser: 0.84 mg/dL (ref 0.61–1.24)
Creatinine, Ser: 0.72 mg/dL (ref 0.61–1.24)
GFR, Estimated: >60 mL/min (ref >60)
GFR, Estimated: >60 mL/min (ref >60)
Glucose, Bld: 276 mg/dL — ABNORMAL HIGH (ref 70–99)
Glucose, Bld: 143 mg/dL — ABNORMAL HIGH (ref 70–99)
Potassium: 3.3 mmol/L — ABNORMAL LOW (ref 3.5–5.1)
Potassium: 3.1 mmol/L — ABNORMAL LOW (ref 3.5–5.1)
Sodium: 138 mmol/L (ref 135–145)
Sodium: 142 mmol/L (ref 135–145)

## 2020-03-01 LAB — MAGNESIUM: Magnesium: 2 mg/dL (ref 1.7–2.4)

## 2020-03-01 LAB — HEPATIC FUNCTION PANEL
ALT: 167 U/L — ABNORMAL HIGH (ref 0–44)
AST: 46 U/L — ABNORMAL HIGH (ref 15–41)
Albumin: 2.3 g/dL — ABNORMAL LOW (ref 3.5–5.0)
Alkaline Phosphatase: 75 U/L (ref 38–126)
Bilirubin, Direct: 0.3 mg/dL — ABNORMAL HIGH (ref 0.0–0.2)
Indirect Bilirubin: 0.8 mg/dL (ref 0.3–0.9)
Total Bilirubin: 1.1 mg/dL (ref 0.3–1.2)
Total Protein: 5.8 g/dL — ABNORMAL LOW (ref 6.5–8.1)

## 2020-03-01 LAB — PHOSPHORUS: Phosphorus: 2.5 mg/dL (ref 2.5–4.6)

## 2020-03-01 LAB — BLOOD GAS, ARTERIAL
Acid-Base Excess: 0.7 mmol/L (ref 0.0–2.0)
Bicarbonate: 23 mmol/L (ref 20.0–28.0)
Drawn by: 23532
FIO2: 0.3
MECHVT: 540 mL
O2 Saturation: 95.1 %
PEEP: 5 cmH2O
Patient temperature: 37.3
RATE: 24 resp/min
pCO2 arterial: 29.4 mmHg — ABNORMAL LOW (ref 32.0–48.0)
pH, Arterial: 7.505 — ABNORMAL HIGH (ref 7.350–7.450)
pO2, Arterial: 67.6 mmHg — ABNORMAL LOW (ref 83.0–108.0)

## 2020-03-01 LAB — GLUCOSE, CAPILLARY
Glucose-Capillary: 179 mg/dL — ABNORMAL HIGH (ref 70–99)
Glucose-Capillary: 224 mg/dL — ABNORMAL HIGH (ref 70–99)
Glucose-Capillary: 221 mg/dL — ABNORMAL HIGH (ref 70–99)
Glucose-Capillary: 119 mg/dL — ABNORMAL HIGH (ref 70–99)
Glucose-Capillary: 110 mg/dL — ABNORMAL HIGH (ref 70–99)
Glucose-Capillary: 142 mg/dL — ABNORMAL HIGH (ref 70–99)

## 2020-03-01 LAB — CBC
HCT: 30.7 % — ABNORMAL LOW (ref 39.0–52.0)
Hemoglobin: 10 g/dL — ABNORMAL LOW (ref 13.0–17.0)
MCH: 29.9 pg (ref 26.0–34.0)
MCHC: 32.6 g/dL (ref 30.0–36.0)
MCV: 91.9 fL (ref 80.0–100.0)
Platelets: 167 10*3/uL (ref 150–400)
RBC: 3.34 MIL/uL — ABNORMAL LOW (ref 4.22–5.81)
RDW: 18.1 % — ABNORMAL HIGH (ref 11.5–15.5)
WBC: 11.6 10*3/uL — ABNORMAL HIGH (ref 4.0–10.5)
nRBC: 0.5 % — ABNORMAL HIGH (ref 0.0–0.2)

## 2020-03-01 LAB — TROPONIN I (HIGH SENSITIVITY): Troponin I (High Sensitivity): 942 ng/L (ref <18)

## 2020-03-01 MED ORDER — POTASSIUM CHLORIDE 10 MEQ/50ML IV SOLN
10.0000 meq | INTRAVENOUS | Status: AC
  Administered 2020-03-01 – 2020-03-02 (×4): 10 meq via INTRAVENOUS
  Filled 2020-03-01 (×4): qty 50

## 2020-03-01 MED ORDER — FUROSEMIDE 10 MG/ML IJ SOLN
80.0000 mg | Freq: Three times a day (TID) | INTRAMUSCULAR | Status: DC
  Administered 2020-03-01: 40 mg via INTRAVENOUS
  Administered 2020-03-01: 80 mg via INTRAVENOUS
  Filled 2020-03-01 (×2): qty 8

## 2020-03-01 MED ORDER — POTASSIUM CHLORIDE 20 MEQ PO PACK
40.0000 meq | PACK | Freq: Every day | ORAL | Status: DC
  Administered 2020-03-01: 40 meq

## 2020-03-01 MED ORDER — FUROSEMIDE 10 MG/ML IJ SOLN
40.0000 mg | Freq: Three times a day (TID) | INTRAMUSCULAR | Status: DC
  Administered 2020-03-02: 40 mg via INTRAVENOUS
  Filled 2020-03-01: qty 4

## 2020-03-01 MED ORDER — ASPIRIN 81 MG PO CHEW
81.0000 mg | CHEWABLE_TABLET | Freq: Every day | ORAL | Status: DC
  Administered 2020-03-01 – 2020-03-07 (×6): 81 mg via ORAL
  Filled 2020-03-01 (×5): qty 1

## 2020-03-01 MED ORDER — PHENOL 1.4 % MT LIQD
1.0000 | OROMUCOSAL | Status: DC | PRN
  Administered 2020-03-01: 1 via OROMUCOSAL
  Filled 2020-03-01: qty 177

## 2020-03-01 MED ORDER — POTASSIUM CHLORIDE 20 MEQ PO PACK
40.0000 meq | PACK | Freq: Two times a day (BID) | ORAL | Status: DC
  Administered 2020-03-01: 40 meq
  Filled 2020-03-01: qty 2

## 2020-03-01 NOTE — Progress Notes (Addendum)
Progress Note  Patient Name: Colton Guzman Date of Encounter: 03/01/2020  [facility] Cardiologist: Freada Bergeron, MD   Subjective   On Vent. But shakes head to questions and follows instructions.    Inpatient Medications    Scheduled Meds: . chlorhexidine gluconate (MEDLINE KIT)  15 mL Mouth Rinse BID  . Chlorhexidine Gluconate Cloth  6 each Topical Q0600  . docusate  100 mg Per Tube BID  . feeding supplement (PROSource TF)  45 mL Per Tube TID  . furosemide  80 mg Intravenous Q8H  . insulin aspart  0-9 Units Subcutaneous Q4H  . insulin glargine  10 Units Subcutaneous BID  . mouth rinse  15 mL Mouth Rinse 10 times per day  . pantoprazole (PROTONIX) IV  40 mg Intravenous Q12H  . polyethylene glycol  17 g Per Tube Daily  . potassium chloride  40 mEq Per Tube BID  . potassium chloride  40 mEq Per Tube Daily  . sodium chloride flush  10-40 mL Intracatheter Q12H   Continuous Infusions: . sodium chloride Stopped (03/01/20 0639)  . dexmedetomidine (PRECEDEX) IV infusion 0.4 mcg/kg/hr (03/01/20 0800)  . feeding supplement (OSMOLITE 1.5 CAL) Stopped (02/27/20 0120)  . fentaNYL infusion INTRAVENOUS 350 mcg/hr (03/01/20 0800)  . levETIRAcetam Stopped (03/01/20 0528)  . piperacillin-tazobactam (ZOSYN)  IV Stopped (03/01/20 1039)   PRN Meds: sodium chloride, alum & mag hydroxide-simeth, fentaNYL, fentaNYL (SUBLIMAZE) injection, midazolam, nitroGLYCERIN, ondansetron **OR** ondansetron (ZOFRAN) IV, sodium chloride flush   Vital Signs    Vitals:   03/01/20 0851 03/01/20 0900 03/01/20 1000 03/01/20 1118  BP:  (!) 161/92 (!) 157/94   Pulse:  97 95   Resp:  (!) 22 18   Temp:      TempSrc:      SpO2: 92% 93% 99% 99%  Weight:      Height:        Intake/Output Summary (Last 24 hours) at 03/01/2020 1147 Last data filed at 03/01/2020 1039 Gross per 24 hour  Intake 3016.08 ml  Output 2275 ml  Net 741.08 ml   Last 3 Weights 03/01/2020 02/29/2020 02/28/2020  Weight (lbs) 224  lb 13.9 oz 211 lb 10.3 oz 219 lb 5.7 oz  Weight (kg) 102 kg 96 kg 99.5 kg      Telemetry    Ventricular escape rhythm during the night - Personally Reviewed  ECG    Had 3 done during the night, 1st SB at 50 with ant. MI 2nd with prolonged Qtc of 529 and SB and ant. MI and the 3rd with SR at 14 with ant MI and INf and lat st depression.   - Personally Reviewed  Physical Exam   GEN: No acute distress.  On Vent with some sedation but follows commands Neck: No JVD Cardiac: RRR, no murmurs, rubs, or gallops.  Respiratory: Clear to auscultation bilaterally. GI: Soft, nontender, non-distended  MS: mild edema; No deformity. Neuro:  Nonfocal  Psych: Normal affect   Labs    High Sensitivity Troponin:   Recent Labs  Lab 02/24/20 0432 02/24/20 1124 02/24/20 1803 02/24/20 2000 03/01/20 0624  TROPONINIHS 12,216* 15,553* 12,157* 9,517* 942*      Chemistry Recent Labs  Lab 02/23/20 2049 02/24/20 0000 02/26/20 0200 02/27/20 0145 02/28/20 0512 02/29/20 0230 03/01/20 0624  NA 136  138   < > 134* 134* 140 139 138  K 3.0*  3.0*   < > 3.9 3.2* 3.1* 3.1* 3.3*  CL 106  107   < >  100 103 109 110 108  CO2 19*  19*   < > 23 17* 21* 19* 21*  GLUCOSE 291*  272*   < > 127* 199* 197* 179* 276*  BUN 16  18   < > $R'13 19 12 15 13  'el$ CREATININE 0.77  0.83   < > 0.77 0.72 0.78 0.74 0.84  CALCIUM 7.3*  7.3*   < > 7.8* 7.4* 7.4* 7.6* 7.5*  PROT 4.4*  --  6.4* 5.4*  --   --   --   ALBUMIN 2.5*  --  3.2* 2.5*  --   --   --   AST 41  --  756* 320*  --   --   --   ALT 37  --  675* 536*  --   --   --   ALKPHOS 30*  --  54 57  --   --   --   BILITOT 0.5  --  1.2 2.1*  --   --   --   GFRNONAA >60  >60   < > >60 >60 >60 >60 >60  ANIONGAP 11  12   < > $R'11 14 10 10 9   'Sd$ < > = values in this interval not displayed.     Hematology Recent Labs  Lab 02/29/20 0230 02/29/20 1100 02/29/20 1747 03/01/20 0624  WBC 11.7*  --  11.0* 11.6*  RBC 3.23*  --  3.12* 3.34*  HGB 9.8* 9.2* 9.3* 10.0*   HCT 29.8* 27.8* 28.9* 30.7*  MCV 92.3  --  92.6 91.9  MCH 30.3  --  29.8 29.9  MCHC 32.9  --  32.2 32.6  RDW 17.2*  --  17.9* 18.1*  PLT 126*  --  135* 167    BNPNo results for input(s): BNP, PROBNP in the last 168 hours.   DDimer No results for input(s): DDIMER in the last 168 hours.   Radiology    CT HEAD WO CONTRAST  Result Date: 03/01/2020 CLINICAL DATA:  60 year old male with altered mental status. New posturing. Recent respiratory failure, GI bleed, cardiac arrest EXAM: CT HEAD WITHOUT CONTRAST TECHNIQUE: Contiguous axial images were obtained from the base of the skull through the vertex without intravenous contrast. COMPARISON:  Head CT 02/25/2020. FINDINGS: Brain: No midline shift, mass effect, or evidence of intracranial mass lesion. No ventriculomegaly No acute intracranial hemorrhage identified. Gray-white matter differentiation appears stable throughout the brain and within normal limits. No cerebral edema is evident. No cortically based acute infarct identified. Faint basal ganglia dystrophic calcifications more apparent on the left are stable. Vascular: Extensive Calcified atherosclerosis at the skull base. No suspicious intracranial vascular hyperdensity. Skull: No acute osseous abnormality identified. Sinuses/Orbits: Fairly good sinus and mastoid aeration in the setting of intubation. Mostly resolved fluid in the visible pharynx. Other: No acute orbit or scalp soft tissue finding. IMPRESSION: No acute intracranial abnormality. Stable since 02/25/2020 and largely negative non contrast CT appearance of the brain. Electronically Signed   By: Genevie Ann M.D.   On: 03/01/2020 06:12    Cardiac Studies   Echo 02/25/2020 1. LVEF 50-55% with wall motion abnormalities suspicious for mid LAD. No  apical thrombus.  2. Left ventricular ejection fraction, by estimation, is 50 to 55%. The  left ventricle has low normal function. The left ventricle demonstrates  regional wall motion  abnormalities (see scoring diagram/findings for  description). Left ventricular diastolic  parameters are consistent with Grade II diastolic dysfunction  (pseudonormalization). Elevated left atrial  pressure.  3. Right ventricular systolic function is normal. The right ventricular  size is normal. There is moderately elevated pulmonary artery systolic  pressure. The estimated right ventricular systolic pressure is 05.3 mmHg.  4. Left atrial size was mildly dilated.  5. The mitral valve is normal in structure. Moderate mitral valve  regurgitation. No evidence of mitral stenosis.  6. The aortic valve is normal in structure. Aortic valve regurgitation is  not visualized. Mild to moderate aortic valve sclerosis/calcification is  present, without any evidence of aortic stenosis.  7. The inferior vena cava is normal in size with greater than 50%  respiratory variability, suggesting right atrial pressure of 3 mmHg.   Patient Profile     60 y.o. male with a hx of HTN and DMwho is being seen for the evaluation ofVF arrest and Afibin setting of rectal bleeding.   Assessment & Plan   (Pt's wife works on [place] at [employer])   VT Arrest/NSTEMI/Possible cardiogenic shock/CHF/Vent dependent Had VT arrest on 1/4 in setting of ongoing GIB. ROSC after 2 minutes of CPR and cardioversion. Troponin peaked >15K, then trending down. Had dynamic EKG changes post arrest (ST depression inferior and anterior lateral leads), resolved on last EKG. -Echo showed LVEF of 50-55%with wall motion abnormalities suspicious for mid LAD. No apical thrombus. Grade II DD.  Moderate MVR  - Started on heparin gtt 1/6 evening without bolus after clearance from GI. However,  had recurrent bleeding midnight on 1/8. Stopped heparin 1AM on 1/8. Received 4 units of transfusion total and none since.  - Diagnostic cath once stabilized   Maybe by Thursday or Friday  -No BB or ACE/ARB while needing pressors.  - Keep K > 4 and Mg >  2  K+ today 3.3  -CCM replacing - EKG with sinus brady in 50s with prolonged Qtc, then improved to SR 93 with lateral ST depression   -prolonged Qtc and ventricular escape improved with decrease of Precedex with improvement  - ok to begin ASA - gave once yesterday and I ordered daily today per NG - plan to wean and extubate today.  - +9767 since admit now diuresing with neg 2149 in last 24 hours. on lasix 80 mg every 8 hours.  Ventricular escape rhythm in night  - repeat troponin 942 decreasing  pk was 15,553 on 02/24/20  Decreased precedex with resolution   Atrial fib with RVR  Post arrest - converted to SR and maintaining SR - - Not an anticoagulation candidate with ongoing GI bleed  GI blood 2nd to likely diverticular bleed - EGD 1/8 negative for bleeding or ulcer - Colonoscopy 1/8 with rectal bleeding and multiple clots. Felt likely due to diverticular bleed.    Mental status changes during the night -posturing now improved -CT head non contrast with no acute abnormality per CCM.     For questions or updates, please contact [place] Please consult [email or web address] for contact info under      Signed, Cecilie Kicks, NP  03/01/2020, 11:47 AM    History and all data above reviewed.  Patient examined.  I agree with the findings as above.   He is awake and responding on the vent.  Denies chest pain.  Diuresing very well.  The patient exam reveals COR:RRR  ,  Lungs: Few basilar crackles  ,  Abd: Positive bowel sounds, no rebound no guarding, Ext Diffuse edema  .  All available labs, radiology testing, previous records reviewed. Agree with documented assessment and plan.  Arrhythmia:  Some bradycardia as noted but no sustained arrhythmias.  Should be stable off as sedation is reduced in anticipation of extubation.  NSTEMI:  Plan cath before discharge.  Discussed with wife.  No evidence of acute ongoing ischemia.  ASA started yesterday.    Colton Guzman  1:49 PM  03/01/2020

## 2020-03-01 NOTE — Progress Notes (Signed)
[place] Progress Note Patient Name: Colton Guzman DOB: 1960/03/07 MRN: [id number]   Date of Service  03/01/2020  HPI/Events of Note  Agitation - Request to renew bilateral soft wrist restraint orders.   eICU Interventions  Plan: 1. Will renew bilateral soft wrist restraints X 9 hours.      Intervention Category Major Interventions: Delirium, psychosis, severe agitation - evaluation and management  Makinsey Pepitone Eugene 03/01/2020, 1:18 AM

## 2020-03-01 NOTE — Progress Notes (Signed)
[facility] Gastroenterology Progress Note  Colton Guzman 60 y.o. [birth year]  CC:  Rectal bleeding  Subjective: Patient remains intubated and sedated.  No documented stools or rectal bleeding since small volume hematochezia early yesterday morning.  ROS : Review of Systems  Unable to perform ROS: Intubated   Objective: Vital signs in last 24 hours: Vitals:   03/01/20 0800 03/01/20 0851  BP: (!) 177/90   Pulse: (!) 127   Resp: (!) 26   Temp: (!) 101.3 F (38.5 C)   SpO2: 94% 92%    Physical Exam:  General:  Ill-appearing, intubated and sedated, opens eyes intermittently  Head:  Normocephalic, without obvious abnormality, atraumatic  Lungs:   Mechanical ventilation, coarse lung sounds bilaterally  Heart:  Mildly bradycardic, regular rhythm  Abdomen:   Soft with mild distention. No grimace with palpation. Bowel sounds active all four quadrants.  Extremities: SCDs in place    Lab Results: Recent Labs    02/29/20 0230 03/01/20 0624  NA 139 138  K 3.1* 3.3*  CL 110 108  CO2 19* 21*  GLUCOSE 179* 276*  BUN 15 13  CREATININE 0.74 0.84  CALCIUM 7.6* 7.5*  MG  --  2.0  PHOS  --  2.5   No results for input(s): AST, ALT, ALKPHOS, BILITOT, PROT, ALBUMIN in the last 72 hours. Recent Labs    02/29/20 1747 03/01/20 0624  WBC 11.0* 11.6*  HGB 9.3* 10.0*  HCT 28.9* 30.7*  MCV 92.6 91.9  PLT 135* 167   No results for input(s): LABPROT, INR in the last 72 hours.  Assessment: Painless hematochezia, most likely diverticular bleeding.  Colonoscopy 1/8 revealed hematin (altered blood/coffee-ground-like material) was found in the entire colon. Visualization was difficult because of multiple large blood clots. There was evidence of recent bleeding but I was not able to identify any active bleeding source despite of thorough lavage, most likely diverticular bleed.  No evidence of colitis (CT-A 1/3 showed mild circumferential wall thickening in descending colon).  EGD with mild  gastritis, otherwise normal.   -Hgb 10.0, stable.  Patient had 4u pRBCs on 1/8 but has not required transfusion since that time  V Tach cardiac arrest s/p cardioversion and CPR, troponin 15,553 on 1/5, now 942 today 1/11  A Fib with RVR post-resuscitation  Acute respiratory failure, mechanically ventilated  Elevated LFTs, most likely ischemic hepatitis.  AST/ALT trending down, delayed rise in T. Bili.   AST 320/ ALT 536/ T. Bili 2.1 as of 1/8. Today's LFTs pending.   Plan: Continue supportive care.    If patient has recurrence of bleeding, consider nuclear bleeding scan.  If destabilizing bleeding, recommend CT-A followed by IR consultation if positive.  Continue to monitor H&H with transfusion as needed to maintain Hgb >8.  OK to extubate from GI standpoint.  [facility] will follow at a distance.  Salley Slaughter PA-C 03/01/2020, 9:47 AM  Contact #  [phone]

## 2020-03-01 NOTE — Procedures (Signed)
Extubation Procedure Note  Patient Details:   Name: Rustyn Conery DOB: 03-03-1960 MRN: [id number]   Airway Documentation:    Vent end date: 03/01/20 Vent end time: 1505   Evaluation  O2 sats: stable throughout Complications: No apparent complications Patient did tolerate procedure well. Bilateral Breath Sounds: Clear,Diminished   Yes   Positive cuff leak pre extubation Placed on BiPAP post extubation  Delight Ovens 03/01/2020, 3:15 PM

## 2020-03-01 NOTE — Progress Notes (Signed)
NAMEMarvis Guzman, MRN:  [id number], DOB:  1960/10/24, LOS: 47 ADMISSION DATE:  02/22/2020, CONSULTATION DATE:  02/23/20 REFERRING MD:  Dr Bonner Puna, CHIEF COMPLAINT:  DKA, lethargy   Brief History:  60 year old man with hypertension, diabetes admitted 1/3 with painless hematochezia / apparent lower GI bleeding, leukocytosis. Hgb 13.2 >> 11.6 with IVF. CT abdomen with angiography 1/3 did not identify active bleeding source. Has had several more episodes of BRBPR since arrival and admission.  On 1/4 he had an episode of flatus and then belching with chest discomfort.  He was treated with NTG.  Then developed VT arrest with 59min CPR with ROSC.  Labs consistent with evolving hyperglycemia and metabolic acidosis.  To ICU 1/4 pm.  IV access placed, Aline.  Required vasopressors, rising troponin, possible cardiogenic shock.   Past Medical History:  DM  HTN   Significant Hospital Events:  1/03 Admit   1/04 Tx to SDU with GI blood loss, hyperglycemia, metabolic acidosis.  Pt c/o chest discomfort around 1745, received nitroglycerin x1.  Recurrent chest discomfort and then VT on monitor. CPR approximately 2 minutes. ET intubated by CRNA, received etomidate and succinylcholine.  Not yet on sedation. Had large-volume bright red blood per rectum at the time of arrest.  1/05 On vasopressors, climbing troponin, no further bleeding. Improved on levophed. Cardiology consulted 1/06 PSV wean 5/5, off pressors  1/8 >  Hep off at 1 am for brb pr  And colonosopy showed lots of BRB but no source, endo nl . Total 4 u prbc  1/10 -  remains on vent. Wife at bedside. Low grade fever + on abx. 30% fio2, on , precedex gtt, Fent gtt off x few hours. On TF.  Per RT  - does not cough when she suctions.  Wife is upset about his mental status but is somewhat reasured compared to yesterday. She is distraught at his lack of speedy recovery. Wondering if the hospital has facilities to help him get better. Wondering why RBC scan was not  done. Wondering if 1gm% dropi in Hgb was active bleeding. Says he was fully vibrant and man of devout faith  Consults:  GI Cardiology   Procedures:  ETT 1/4 >>  R IJ TLC 1/4 >>  Significant Diagnostic Tests:   CT abdomen/pelvis 1/3 >> hepatic steatosis, cholelithiasis and absence of GB or ductal abnormality, normal pancreas, normal ureters, mild circumferential thickening of the descending colon question infectious or inflammatory colitis with scattered diverticula no evidence of GI blood loss.  Bilateral fat-containing inguinal hernias  EEG 1/6  This study is suggestive of moderate to severe diffuse encephalopathy, nonspecific etiology. No seizures or epileptiform discharges were seen throughout the recording  Echo 1/6  EF 50-55% wall motion abnormalities suspicious for mid LAD Grade II diastolic dysfunction  (pseudonormalization). Elevated left atrial pressure moderately elevated pulmonary artery systolic  pressure. The estimated right ventricular systolic pressure is 10.2 mmHg.  LA mildly dilated Moderate MV regurgitation  Mild to moderate aortic valve sclerosis/calcification is  present, without any evidence of aortic stenosis The mid and distal anterior wall, mid and distal anterior septum, and apex  are hypokinetic  Upper and lower Endo 1/8 Blood in colon, no source identified, rec Nuclear study for prob divertilar source    Micro Data:  SARS CoV2 1/3 >> negative Influenza A/B 1/3 >> negative  MRSA  PCR  1/4 >  Neg  BC x 2 1/4>>  UA 1/5 >> rare bacteria/ not cultured Tracheal aspirate 1/5 >>  abundant WBC >> neg BC x 2  1/7 >>>   Antimicrobials:  Zosyn 1/4 >>  Vanc 1/6 >> 1/8 last dose    Scheduled Meds: . chlorhexidine gluconate (MEDLINE KIT)  15 mL Mouth Rinse BID  . Chlorhexidine Gluconate Cloth  6 each Topical Q0600  . docusate  100 mg Per Tube BID  . feeding supplement (PROSource TF)  45 mL Per Tube TID  . free water  100 mL Per Tube Q6H  . insulin  aspart  0-9 Units Subcutaneous Q4H  . insulin glargine  10 Units Subcutaneous BID  . mouth rinse  15 mL Mouth Rinse 10 times per day  . pantoprazole (PROTONIX) IV  40 mg Intravenous Q12H  . polyethylene glycol  17 g Per Tube Daily  . potassium chloride  40 mEq Per Tube Daily  . sodium chloride flush  10-40 mL Intracatheter Q12H   Continuous Infusions: . sodium chloride Stopped (03/01/20 0639)  . dexmedetomidine (PRECEDEX) IV infusion 0.4 mcg/kg/hr (03/01/20 0800)  . feeding supplement (OSMOLITE 1.5 CAL) Stopped (02/27/20 0120)  . fentaNYL infusion INTRAVENOUS 350 mcg/hr (03/01/20 0800)  . lactated ringers 75 mL/hr at 03/01/20 0800  . levETIRAcetam Stopped (03/01/20 0528)  . piperacillin-tazobactam (ZOSYN)  IV 12.5 mL/hr at 03/01/20 0800   PRN Meds:.sodium chloride, alum & mag hydroxide-simeth, fentaNYL, fentaNYL (SUBLIMAZE) injection, midazolam, nitroGLYCERIN, ondansetron **OR** ondansetron (ZOFRAN) IV, sodium chloride flush   Interim History / Subjective:    03/01/2020 - No active bleeding  failed sBT yesterday with desatiuration and resp distres +/ - agitation though noted to have followed commands and distinctly was able to point who his wife was. Cards has started aspirin with ppi support) for NSTEMI after GI clearance . spech eval requested by GI when extubated and PRBC scan if active slow bleeding v CTA-A if brisky bleeding  Concern for bradycardia and escape on monitor last night while on precedex -> EKG ok with Qtc 457msec, Trop flat/improved  fio2 30% on vent currently. oOn fent gtt an dprecedex gtt. CVP 19-20. +13.7L volume overload  Doing WUA. Reports of "posturing" overnight -> ct head ok. This AM following commands  Still with low grade fever 101F  Overall better today per RN  Results for SRIJAN, GIVAN (MRN [id number]) as of 03/01/2020 09:21  Ref. Range 02/24/2020 04:32 02/24/2020 11:24 02/24/2020 18:03 02/24/2020 20:00 03/01/2020 06:24  Troponin I (High Sensitivity) Latest  Ref Range: <18 ng/L 12,216 (HH) 15,553 (HH) 12,157 (HH) 9,517 (HH) 942 (HH)    Objective   Blood pressure (!) 177/90, pulse (!) 127, temperature (!) 101.3 F (38.5 C), temperature source Oral, resp. rate (!) 26, height $RemoveBe'5\' 8"'MnHdlkmkr$  (1.727 m), weight 102 kg, SpO2 92 %. CVP:  [12 mmHg-20 mmHg] 20 mmHg  Vent Mode: PSV FiO2 (%):  [30 %] 30 % Set Rate:  [24 bmp] 24 bmp Vt Set:  [540 mL] 540 mL PEEP:  [5 cmH20] 5 cmH20 Pressure Support:  [5 cmH20] 5 cmH20 Plateau Pressure:  [19 cmH20-24 cmH20] 24 cmH20   Intake/Output Summary (Last 24 hours) at 03/01/2020 0917 Last data filed at 03/01/2020 0800 Gross per 24 hour  Intake 3206.39 ml  Output 475 ml  Net 2731.39 ml   Filed Weights   02/28/20 0330 02/29/20 0406 03/01/20 0500  Weight: 99.5 kg 96 kg 102 kg   CVP:  [12 mmHg-20 mmHg] 20 mmHg     General Appearance:  Looks criticall ill OBESE - + Head:  Normocephalic, without obvious abnormality, atraumatic Eyes:  PERRL -  yes, conjunctiva/corneas - miudd     Ears:  Normal external ear canals, both ears Nose:  G tube - no Throat:  ETT TUBE - yes , OG tube - yes Neck:  Supple,  No enlargement/tenderness/nodules Lungs: Clear to auscultation bilaterally, Ventilator   Synchrony - yes on SBT Heart:  S1 and S2 normal, no murmur, CVP - 20.  Pressors - no Abdomen:  Soft, no masses, no organomegaly Genitalia / Rectal:  Not done Extremities:  Extremities- intact Skin:  ntact in exposed areas . Sacral area - not examined Neurologic:  Sedation - fent gtt, precedex gtt -> RASS - -2 . Moves all 4s - yes. CAM-ICU - unable to assess . Orientation - does follow commands. Seems to be uncomfortable          Resolved Hospital Problem list    Anion gap metabolic acidosis.   Due to DKA and also component of this was lactic acidosis, was improved with volume resuscitation and treatment of his DKA -  Resolved   Thrombocytopenia - resolrev 03/01/20  Assessment & Plan:   S/p VT Arrest 1/4 , in the setting  of acute GI blood loss, presumed hyporperfusion.  ECG just prior and then post resuscitation with anterior ST elevations. A Fib + RVR post resuscitation> back to SR p amiodarone d/c'd 1/7  ? Cardiogenic Shock  - off pressoprs since 1/8 NSTEMI - pk trop 15K  03/01/2020 - not on pressors. Heparing gtt on hold due to GIB recurrentct. HR 101 and isnus  Plan - aspirin per cards (GI wants PPI concomitant)  -will likely need diagnostic cath once stabilized/ cards following    Acute Hypoxic Respiratory Failure and mechanical ventilation post-arrest Suspected Aspiration due to Cardiac Arrest  Acute Pulmonary Edema    03/01/2020 - > does yes meet criteria for SBT and is doing it better than yesterday but 13.7L volume overload and pending WUA findings could preclude extubation in setting of Acute Respiratory Failure   Plan  - try SBT off precedex and fent gtt - diurese for volume overload - assess for extubation in few hours  Volume overload  03/01/2020 - 13.7L volume overload   Plan  - start scheduled high dose lasix with replacement K  - turn off LR  Jerking suspicious for seizure activity vs myoclonus s/p arrest  - EEG 1/7  negative for seizures but c/w TME  - ct head repeat 1/11 - neg - continue Keppra empirically  - changed over entirely to precedex am 1/9 to see if cortical function improves enough to extubate      03/01/2020 - no seizures. Repeat CT head non diagnostic. Follows commands. Reports of "posturing" last night could have been aiggation  Plan  - monitor - keppra  Fever  Possible etiologies include MI, aspiration post arrest, GI source/ CNS (anoxic injury)   -consider PICC placement once patient is afebrile x 24-48 hours  >>>  D/c'd vanc 1/10 as mrsa pcr neg/ Et neg for mrsa also   03/01/2020 - still febrile but PCT trends ok  Plan  - moniotpr     Acute GI bleed, appears lower based on history, hematochezia - Had VT arrest on 1/4 in setting of ongoing GIB.  Started on heparin gtt 1/6 evening without bolus after clearance from GI. However,  had recurrent bleeding midnight on 1/8. Stopped heparin 1AM on 1/8. Received transfusion.   - CT-A 1/3 without colitiis other than mild wall thickeing of descending color - s/p upper and  lower endoscopy 1/8 no source identified other than mild gastritis, presume tic - per GI 1/10 ? diveritcular bleed   03/01/2020 - No active bleeding. Last PRBC 4 units 02/27/20  Plan   - - PRBC for hgb </= 6.9gm%    - exceptions are   -  if ACS susepcted/confirmed then transfuse for hgb </= 8.0gm%,  or    -  active bleeding with hemodynamic instability, then transfuse regardless of hemoglobin value   At at all times try to transfuse 1 unit prbc as possible with exception of active hemorrhage  - Nuclear blood scan if concern for ongoing slow bleeding v CTA if brisk bleeding per GI    - abx per flow ssheet for colitis mild     ELectrolyte imbalance  03/01/2020   K low,   Plan  - repelte K - close watch on K in setting of  lasix   Diabetes Mellitus 2 with DKA/HHS Initially required insulin infusion, transitioned off 1/5  -CBG goal 140-180  -SSI, sensitive  Scale plus lantus 10 units bid   - holding home metformin, glyburide   Hx Hypertension -holding home hyzaar, metoprolol, amlodipine   Hyperlipidemia - hold home atorvastatin for now - Trending LFT's and resume if normalize   Best practice (evaluated daily)  Diet: NPO / TF  Pain/Anxiety/Delirium protocol (if indicated): orecedex drip  VAP protocol (if indicated): In place  DVT prophylaxis: SCD, no anticoagulation GI prophylaxis: PPI  Glucose control: SSI plus lantus  Mobility: BR Disposition: ICU    Global: Hopefully diurse -> improve distress -> extubate. Mental status seems better  Goals of Care:  Last date of multidisciplinary goals of care discussion:  at length with wife 1/8  Family and staff present: Wife Summary of discussion:  Pending   Follow up goals of care discussion due: wife updated 03/01/2020  Code Status: Full code    Multi-Disciplinary Goals of Care Discussion Date of Discussion 02/29/2020 and is Day 7 since admit  Primary service for patient ccm3  Location of discussion Bedside 82  Family and Staff present Nurse Alroy Dust, wife, Dr Chase Caller  Summary of discussion She is very distraught husband is ill and pace of recovery is slow. Explained. Will do SBT and asess for extubation (had to repeateldy explain that we are doing an asesment iwht intent to extubate and we are not yet committting to extubation).  She wants to talk to GI  Followup goals of care due by   Misc comments if any Will see if we can extend spiritual support to wife while addressing medical issues  Explained to wife geeneral nature of ICU care and tried to set expectations that it could be weeks beforfe he can go home  Offered transfer to anotehr facility if they desired          ATTESTATION & SIGNATURE   The patient Jemarion Roycroft is critically ill with multiple organ systems failure and requires high complexity decision making for assessment and support, frequent evaluation and titration of therapies, application of advanced monitoring technologies and extensive interpretation of multiple databases.   Critical Care Time devoted to patient care services described in this note is  60  Minutes. This time reflects time of care of this signee Dr Brand Males. This critical care time does not reflect procedure time, or teaching time or supervisory time of PA/NP/Med student/Med Resident etc but could involve care discussion time     Dr. Brand Males, M.D., [facility].C.P Pulmonary and Critical Care  Medicine Staff Physician [place] [facility] Pager: [phone], If no answer or between  15:00h - 7:00h: call [phone]  03/01/2020 9:17 AM     LABS    PULMONARY Recent Labs  Lab  02/23/20 2129 02/24/20 1124 02/24/20 1440 02/24/20 1819 02/25/20 0814 02/27/20 0936 03/01/20 0326  PHART 7.383  --   --   --   --  7.453* 7.505*  PCO2ART 34.0  --   --   --   --  26.0* 29.4*  PO2ART 238*  --   --   --   --  66.8* 67.6*  HCO3 19.8*  --   --   --   --  17.9* 23.0  O2SAT 99.8   < > 55.7 53.9 97.6 94.2 95.1   < > = values in this interval not displayed.    CBC Recent Labs  Lab 02/29/20 0230 02/29/20 1100 02/29/20 1747 03/01/20 0624  HGB 9.8* 9.2* 9.3* 10.0*  HCT 29.8* 27.8* 28.9* 30.7*  WBC 11.7*  --  11.0* 11.6*  PLT 126*  --  135* 167    COAGULATION Recent Labs  Lab 02/23/20 2049 02/27/20 0143  INR 1.5* 1.3*    CARDIAC  No results for input(s): TROPONINI in the last 168 hours. No results for input(s): PROBNP in the last 168 hours.   CHEMISTRY Recent Labs  Lab 02/25/20 1014 02/25/20 1743 02/26/20 0200 02/26/20 1753 02/27/20 0145 02/28/20 0512 02/29/20 0230 03/01/20 0624  NA  --   --  134*  --  134* 140 139 138  K  --   --  3.9  --  3.2* 3.1* 3.1* 3.3*  CL  --   --  100  --  103 109 110 108  CO2  --   --  23  --  17* 21* 19* 21*  GLUCOSE  --   --  127*  --  199* 197* 179* 276*  BUN  --   --  13  --  $R'19 12 15 13  'ea$ CREATININE  --   --  0.77  --  0.72 0.78 0.74 0.84  CALCIUM  --   --  7.8*  --  7.4* 7.4* 7.6* 7.5*  MG 2.1 1.8 1.9 2.1 1.9  --   --  2.0  PHOS 2.0* 2.7 2.5 2.0*  --   --   --  2.5   Estimated Creatinine Clearance: 112.3 mL/min (by C-G formula based on SCr of 0.84 mg/dL).   LIVER Recent Labs  Lab 02/23/20 2049 02/26/20 0200 02/27/20 0143 02/27/20 0145  AST 41 756*  --  320*  ALT 37 675*  --  536*  ALKPHOS 30* 54  --  57  BILITOT 0.5 1.2  --  2.1*  PROT 4.4* 6.4*  --  5.4*  ALBUMIN 2.5* 3.2*  --  2.5*  INR 1.5*  --  1.3*  --      INFECTIOUS Recent Labs  Lab 02/23/20 2049 02/24/20 0020 02/24/20 0432 02/24/20 1249 02/25/20 0440 02/26/20 0200 02/28/20 0512  LATICACIDVEN 6.4* 2.1*  --  3.2*  --   --   --    PROCALCITON  --   --    < >  --  0.36 0.46 0.77   < > = values in this interval not displayed.     ENDOCRINE CBG (last 3)  Recent Labs    02/29/20 2043 02/29/20 2345 03/01/20 0357  GLUCAP  156* 203* 179*         IMAGING x48h  - image(s) personally visualized  -   highlighted in bold CT HEAD WO CONTRAST  Result Date: 03/01/2020 CLINICAL DATA:  60 year old male with altered mental status. New posturing. Recent respiratory failure, GI bleed, cardiac arrest EXAM: CT HEAD WITHOUT CONTRAST TECHNIQUE: Contiguous axial images were obtained from the base of the skull through the vertex without intravenous contrast. COMPARISON:  Head CT 02/25/2020. FINDINGS: Brain: No midline shift, mass effect, or evidence of intracranial mass lesion. No ventriculomegaly No acute intracranial hemorrhage identified. Gray-white matter differentiation appears stable throughout the brain and within normal limits. No cerebral edema is evident. No cortically based acute infarct identified. Faint basal ganglia dystrophic calcifications more apparent on the left are stable. Vascular: Extensive Calcified atherosclerosis at the skull base. No suspicious intracranial vascular hyperdensity. Skull: No acute osseous abnormality identified. Sinuses/Orbits: Fairly good sinus and mastoid aeration in the setting of intubation. Mostly resolved fluid in the visible pharynx. Other: No acute orbit or scalp soft tissue finding. IMPRESSION: No acute intracranial abnormality. Stable since 02/25/2020 and largely negative non contrast CT appearance of the brain. Electronically Signed   By: Genevie Ann M.D.   On: 03/01/2020 06:12

## 2020-03-01 NOTE — Progress Notes (Signed)
   Diuresed well 6L urine out with lasix Off fent gtt Doing SBt On precedex gtt CAM-ICU neg for delirium Looks better  Wife at bedsie - less anxious   Plan  - extubate to bipap - continue lasix     SIGNATURE    Dr. Brand Males, M.D., F.C.C.P,  Pulmonary and Critical Care Medicine Staff Physician, [place] Director - Interstitial Lung Disease  Program  Pulmonary [place] at [place], [place], [address]  Pager: [phone], If no answer  OR between  19:00-7:00h: page [phone] Telephone (clinical office): [phone] Telephone (research): [phone]  2:52 PM 03/01/2020

## 2020-03-01 NOTE — Care Plan (Signed)
Admitted on 1/3 by [facility] w BRBPR   Colton Guzman is a 60 y.o. male with a history of HTN and T2DM on daily aspirin 81mg  who presented to the ED 02/22/2020 with painless hematochezia at ~3:45am that morning. He went to sleep in his usual state of health and awoke to spontaneous bleeding, had 3 more episodes of blood that appeared cherry color at first, now frank red with no admixed stool. This is constant, progressing, and becoming associated with lightheadedness and decreased exertional tolerance.   In the ED, +FOBT with some ongoing bleeding HR peaking in 120's but normotensive, afebrile, abd exam reportedly benign. Hgb was noted to be 13.2 g/dl (unknown baseline). 1L NS given, hgb down to 11.6g/dl and patient admitted to [facility] with GI consulted.  Never had screening colonoscopy, no GI doctor in [place]. Does not take other NSAIDs regularly, does not drink heavily, has no history of GI bleeding prior to this event, is not Jehovah's witness. Has had 2 doses of covid vaccine.  GI, Dr. Therisa Doyne.  Seen by GI felt likely infectious colitis and plan for colonoscopy after resolution of DKA. On 1/4 evening, pt had episode of chest pain episode with bloody BM. CP resolved with SL nitro x 1. However had recurrently episode of chest pain but went into VT arrest. 2-5 minutes of CPR and shock. Post resuscitation rhythm was afib. Hs-troponin peaked at 15553 then treaded down. Treated with pressors for possible cardiogenic shock. Given concern for ischemic etiology of his VF arrest, cardiology is asked for evaluation. Converted to sinus Required vasopressors, On Preceedx being weaned off Has been on PCCM team since  Pick up from PCCM in AM 03/02/20 Colton Guzman 6:51 PM

## 2020-03-01 NOTE — Progress Notes (Signed)
eLink Physician-Brief Progress Note Patient Name: Colton Guzman DOB: 1960-04-10 MRN: [id number]   Date of Service  03/01/2020  HPI/Events of Note  Nursing concerned about Ventricular escape rhythm. Review of EKG reveals sinus bradycardia with occasional Premature ventricular complexes with ventricular escape complexes Low voltage QRS Cannot rule out Anteroseptal infarct , age undetermined. Prolonged QT Likely secondary to bradycardia d/t Precedex and escape beats.   eICU Interventions  Plan: 1. Wean Precedex IV infusion as tolerated.  2. Cycle Troponin 3. ABG STAT. 4. Send AM labs now.      Intervention Category Major Interventions: Arrhythmia - evaluation and management  Kemar Pandit Eugene 03/01/2020, 3:29 AM

## 2020-03-01 NOTE — Progress Notes (Signed)
PM rounds - contues to do well . Precedex slowly being weaned   Plan  - [facility] Dr Avelina Laine primary from 03/02/20  And ccm as consult for overload - RN to wean off precedex overnight    SIGNATURE    Dr. Brand Males, M.D., F.C.C.P,  Pulmonary and Critical Care Medicine Staff Physician, [place] Director - Interstitial Lung Disease  Program  Pulmonary [place] at [place], [place], [address]  Pager: [phone], If no answer  OR between  19:00-7:00h: page [phone]  [phone] Telephone (clinical office): [phone] Telephone (research): [phone]  6:23 PM 03/01/2020

## 2020-03-01 NOTE — Progress Notes (Signed)
Chaplain engaged in initial visit with Spiros and his wife, [name].  During visit, chaplain learned that Kemani and his wife are very connected to their faith and belief in 42.  Greydon enjoys going to mass and participating in services.  His wife also detailed growing up as a teenager and going to church seven days out of the week because of how much she enjoyed being there.  She shared that her and Mansfield met in high school and reconnected years later as adults.    Chaplain offered presence, listening, and prayer with them.  [name] shared how thankful she was for chaplain's prayer and support.  [name] voiced the plan is to extubate today and that she wanted that to go smoothly.  Chaplain is available to provide continuous support.    03/01/20 1100  Clinical Encounter Type  Visited With Patient and family together  Visit Type Spiritual support  Referral From Nurse;Family  Consult/Referral To Chaplain

## 2020-03-01 NOTE — Progress Notes (Signed)
Per MD Oletta Darter, pt given IV furosemide 40 mg instead of 80 mg at this time.

## 2020-03-01 NOTE — Progress Notes (Signed)
[place] Progress Note Patient Name: Colton Guzman DOB: 11-Jun-1960 MRN: [id number]   Date of Service  03/01/2020  HPI/Events of Note  Patient is now posturing which is a change in the neurological assessment of the patient.   eICU Interventions  Plan: 1. Head CT Scan w/o contrast STAT.     Intervention Category Major Interventions: Change in mental status - evaluation and management  Aundrey Elahi Cornelia Copa 03/01/2020, 4:37 AM

## 2020-03-01 NOTE — Progress Notes (Signed)
[facility] Progress Note Patient Name: Colton Guzman DOB: 12-03-1960 MRN: [id number]   Date of Service  03/01/2020  HPI/Events of Note  K+ = 3.1 and Creatinine = 0.72.  eICU Interventions  Plan: 1. Will replace K+. 2. Repeat BMP in AM.      Intervention Category Major Interventions: Electrolyte abnormality - evaluation and management  Junella Domke Eugene 03/01/2020, 10:13 PM

## 2020-03-02 ENCOUNTER — Inpatient Hospital Stay (HOSPITAL_COMMUNITY): Payer: Managed Care, Other (non HMO)

## 2020-03-02 DIAGNOSIS — D62 Acute posthemorrhagic anemia: Secondary | ICD-10-CM | POA: Diagnosis not present

## 2020-03-02 DIAGNOSIS — J9601 Acute respiratory failure with hypoxia: Secondary | ICD-10-CM | POA: Diagnosis not present

## 2020-03-02 DIAGNOSIS — K922 Gastrointestinal hemorrhage, unspecified: Secondary | ICD-10-CM | POA: Diagnosis not present

## 2020-03-02 DIAGNOSIS — Z452 Encounter for adjustment and management of vascular access device: Secondary | ICD-10-CM | POA: Diagnosis not present

## 2020-03-02 LAB — GLUCOSE, CAPILLARY
Glucose-Capillary: 132 mg/dL — ABNORMAL HIGH (ref 70–99)
Glucose-Capillary: 140 mg/dL — ABNORMAL HIGH (ref 70–99)
Glucose-Capillary: 220 mg/dL — ABNORMAL HIGH (ref 70–99)
Glucose-Capillary: 159 mg/dL — ABNORMAL HIGH (ref 70–99)

## 2020-03-02 LAB — PROCALCITONIN: Procalcitonin: 0.13 ng/mL

## 2020-03-02 LAB — MAGNESIUM: Magnesium: 1.8 mg/dL (ref 1.7–2.4)

## 2020-03-02 LAB — CULTURE, BLOOD (ROUTINE X 2)
Culture: NO GROWTH
Culture: NO GROWTH

## 2020-03-02 LAB — BASIC METABOLIC PANEL
Anion gap: 13 (ref 5–15)
BUN: 8 mg/dL (ref 6–20)
CO2: 27 mmol/L (ref 22–32)
Calcium: 7.7 mg/dL — ABNORMAL LOW (ref 8.9–10.3)
Chloride: 102 mmol/L (ref 98–111)
Creatinine, Ser: 0.78 mg/dL (ref 0.61–1.24)
GFR, Estimated: >60 mL/min (ref >60)
Glucose, Bld: 158 mg/dL — ABNORMAL HIGH (ref 70–99)
Potassium: 3.3 mmol/L — ABNORMAL LOW (ref 3.5–5.1)
Sodium: 142 mmol/L (ref 135–145)

## 2020-03-02 LAB — PHOSPHORUS: Phosphorus: 2.8 mg/dL (ref 2.5–4.6)

## 2020-03-02 MED ORDER — MAGNESIUM SULFATE IN D5W 1-5 GM/100ML-% IV SOLN
1.0000 g | Freq: Once | INTRAVENOUS | Status: AC
  Administered 2020-03-02: 1 g via INTRAVENOUS
  Filled 2020-03-02: qty 100

## 2020-03-02 MED ORDER — MAGNESIUM OXIDE 400 (241.3 MG) MG PO TABS
400.0000 mg | ORAL_TABLET | Freq: Two times a day (BID) | ORAL | Status: AC
  Administered 2020-03-02 (×2): 400 mg via ORAL
  Filled 2020-03-02 (×2): qty 1

## 2020-03-02 MED ORDER — PANTOPRAZOLE SODIUM 40 MG PO TBEC
40.0000 mg | DELAYED_RELEASE_TABLET | Freq: Every day | ORAL | Status: DC
  Administered 2020-03-03 – 2020-03-07 (×4): 40 mg via ORAL
  Filled 2020-03-02 (×4): qty 1

## 2020-03-02 MED ORDER — METOPROLOL TARTRATE 25 MG PO TABS
25.0000 mg | ORAL_TABLET | Freq: Two times a day (BID) | ORAL | Status: DC
  Administered 2020-03-02 – 2020-03-05 (×6): 25 mg via ORAL
  Filled 2020-03-02 (×6): qty 1

## 2020-03-02 MED ORDER — POTASSIUM CHLORIDE 20 MEQ PO PACK: 40.0000 meq | PACK | Freq: Every day | ORAL | Status: DC

## 2020-03-02 MED ORDER — FUROSEMIDE 10 MG/ML IJ SOLN
40.0000 mg | Freq: Every day | INTRAMUSCULAR | Status: DC
  Administered 2020-03-03 – 2020-03-07 (×5): 40 mg via INTRAVENOUS
  Filled 2020-03-02 (×5): qty 4

## 2020-03-02 MED ORDER — POTASSIUM CHLORIDE 10 MEQ/50ML IV SOLN
10.0000 meq | INTRAVENOUS | Status: AC
  Administered 2020-03-02 (×4): 10 meq via INTRAVENOUS
  Filled 2020-03-02 (×4): qty 50

## 2020-03-02 NOTE — Progress Notes (Signed)
PT Cancellation Note  Patient Details Name: Aws Shere MRN: [id number] DOB: 07/31/1960   Cancelled Treatment:    Reason Eval/Treat Not Completed: Patient at procedure or test/unavailable. At the time patient was checked, patient down for a test. Will check back tomorrow. [place] Pager [phone] Office [phone]     Claretha Cooper 03/02/2020, 4:43 PM

## 2020-03-02 NOTE — Progress Notes (Signed)
[place] Progress Note Patient Name: Colton Guzman DOB: 09/03/1960 MRN: [id number]   Date of Service  03/02/2020  HPI/Events of Note  Hypokalemia - K+ = 3.3 and Creatinine = 0.78. Hypomagnesemia - Mg++ = 1.8..  eICU Interventions  Will replace K+ and Mg++.      Intervention Category Major Interventions: Electrolyte abnormality - evaluation and management  Anvita Hirata Eugene 03/02/2020, 6:14 AM

## 2020-03-02 NOTE — Progress Notes (Addendum)
Progress Note  Patient Name: Colton Guzman Date of Encounter: 03/02/2020  [facility] Cardiologist: Freada Bergeron, MD   Subjective   Extubated yesterday. Patient states he is feeling OK this morning. No pain anywhere. No chest pain. He does not feel short of breath. Only on nasal cannula. No more bloody stools.  Inpatient Medications    Scheduled Meds: . aspirin  81 mg Oral Daily  . Chlorhexidine Gluconate Cloth  6 each Topical Q0600  . docusate  100 mg Per Tube BID  . feeding supplement (PROSource TF)  45 mL Per Tube TID  . furosemide  40 mg Intravenous Q8H  . insulin aspart  0-9 Units Subcutaneous Q4H  . insulin glargine  10 Units Subcutaneous BID  . pantoprazole (PROTONIX) IV  40 mg Intravenous Q12H  . polyethylene glycol  17 g Per Tube Daily  . potassium chloride  40 mEq Per Tube BID  . sodium chloride flush  10-40 mL Intracatheter Q12H   Continuous Infusions: . sodium chloride 10 mL/hr at 03/02/20 0450  . dexmedetomidine (PRECEDEX) IV infusion Stopped (03/01/20 2230)  . levETIRAcetam Stopped (03/02/20 0248)  . magnesium sulfate bolus IVPB    . piperacillin-tazobactam (ZOSYN)  IV 3.375 g (03/02/20 0647)  . potassium chloride 10 mEq (03/02/20 0648)   PRN Meds: sodium chloride, alum & mag hydroxide-simeth, nitroGLYCERIN, ondansetron **OR** ondansetron (ZOFRAN) IV, phenol, sodium chloride flush   Vital Signs    Vitals:   03/02/20 0200 03/02/20 0300 03/02/20 0400 03/02/20 0500  BP: (!) 148/64 (!) 145/65 (!) 145/83 (!) 149/64  Pulse: 86 74 73 77  Resp: 16 18 19 16   Temp:   99.3 F (37.4 C)   TempSrc:   Bladder   SpO2: 99% 98% 99% 99%  Weight:    86.1 kg  Height:        Intake/Output Summary (Last 24 hours) at 03/02/2020 0707 Last data filed at 03/02/2020 0545 Gross per 24 hour  Intake 3139.09 ml  Output 9175 ml  Net -6035.91 ml   Last 3 Weights 03/02/2020 03/01/2020 02/29/2020  Weight (lbs) 189 lb 13.1 oz 224 lb 13.9 oz 211 lb 10.3 oz  Weight (kg)  86.1 kg 102 kg 96 kg      Telemetry    Normal sinus rhythm with rates in th 60's to 70's. - Personally Reviewed  ECG    No new ECG tracing today. - Personally Reviewed  Physical Exam   GEN: No acute distress.   Neck: Supple. Difficult to assess JVD due to right IJ in place. Cardiac: RRR. No murmurs, rubs, or gallops. Radial pulses 2+ and equal bilaterally. Respiratory: No increased work of breathing. Mildly decreased breath sounds with mild crackles noted mostly in left base. GI: Soft, non-distended, and non-tender. MS: Mild ankle edema bilaterally. SCDs in place bilaterally. No deformity. Skin: Warm and dry. Neuro:  No focal deficits. Psych: Normal affect. Responds appropriately.   Labs    High Sensitivity Troponin:   Recent Labs  Lab 02/24/20 0432 02/24/20 1124 02/24/20 1803 02/24/20 2000 03/01/20 0624  TROPONINIHS 12,216* 15,553* 12,157* 9,517* 942*      Chemistry Recent Labs  Lab 02/26/20 0200 02/27/20 0145 02/28/20 0512 03/01/20 0624 03/01/20 2006 03/02/20 0446  NA 134* 134*   < > 138 142 142  K 3.9 3.2*   < > 3.3* 3.1* 3.3*  CL 100 103   < > 108 102 102  CO2 23 17*   < > 21* 27 27  GLUCOSE 127* 199*   < > 276* 143* 158*  BUN 13 19   < > 13 8 8   CREATININE 0.77 0.72   < > 0.84 0.72 0.78  CALCIUM 7.8* 7.4*   < > 7.5* 7.4* 7.7*  PROT 6.4* 5.4*  --  5.8*  --   --   ALBUMIN 3.2* 2.5*  --  2.3*  --   --   AST 756* 320*  --  46*  --   --   ALT 675* 536*  --  167*  --   --   ALKPHOS 54 57  --  75  --   --   BILITOT 1.2 2.1*  --  1.1  --   --   GFRNONAA >60 >60   < > >60 >60 >60  ANIONGAP 11 14   < > 9 13 13    < > = values in this interval not displayed.     Hematology Recent Labs  Lab 02/29/20 0230 02/29/20 1100 02/29/20 1747 03/01/20 0624  WBC 11.7*  --  11.0* 11.6*  RBC 3.23*  --  3.12* 3.34*  HGB 9.8* 9.2* 9.3* 10.0*  HCT 29.8* 27.8* 28.9* 30.7*  MCV 92.3  --  92.6 91.9  MCH 30.3  --  29.8 29.9  MCHC 32.9  --  32.2 32.6  RDW 17.2*  --   17.9* 18.1*  PLT 126*  --  135* 167    BNPNo results for input(s): BNP, PROBNP in the last 168 hours.   DDimer No results for input(s): DDIMER in the last 168 hours.   Radiology    CT HEAD WO CONTRAST  Result Date: 03/01/2020 CLINICAL DATA:  60 year old male with altered mental status. New posturing. Recent respiratory failure, GI bleed, cardiac arrest EXAM: CT HEAD WITHOUT CONTRAST TECHNIQUE: Contiguous axial images were obtained from the base of the skull through the vertex without intravenous contrast. COMPARISON:  Head CT 02/25/2020. FINDINGS: Brain: No midline shift, mass effect, or evidence of intracranial mass lesion. No ventriculomegaly No acute intracranial hemorrhage identified. Gray-white matter differentiation appears stable throughout the brain and within normal limits. No cerebral edema is evident. No cortically based acute infarct identified. Faint basal ganglia dystrophic calcifications more apparent on the left are stable. Vascular: Extensive Calcified atherosclerosis at the skull base. No suspicious intracranial vascular hyperdensity. Skull: No acute osseous abnormality identified. Sinuses/Orbits: Fairly good sinus and mastoid aeration in the setting of intubation. Mostly resolved fluid in the visible pharynx. Other: No acute orbit or scalp soft tissue finding. IMPRESSION: No acute intracranial abnormality. Stable since 02/25/2020 and largely negative non contrast CT appearance of the brain. Electronically Signed   By: Genevie Ann M.D.   On: 03/01/2020 06:12   [place] EKG SITE RITE  Result Date: 03/01/2020 If Site Rite image not attached, placement could not be confirmed due to current cardiac rhythm.   Cardiac Studies   Echocardiogram 02/25/2020: Impressions: 1. LVEF 50-55% with wall motion abnormalities suspicious for mid LAD. No  apical thrombus.  2. Left ventricular ejection fraction, by estimation, is 50 to 55%. The  left ventricle has low normal function. The left ventricle  demonstrates  regional wall motion abnormalities (see scoring diagram/findings for  description). Left ventricular diastolic  parameters are consistent with Grade II diastolic dysfunction  (pseudonormalization). Elevated left atrial pressure.  3. Right ventricular systolic function is normal. The right ventricular  size is normal. There is moderately elevated pulmonary artery systolic  pressure. The estimated right ventricular systolic  pressure is 48.7 mmHg.  4. Left atrial size was mildly dilated.  5. The mitral valve is normal in structure. Moderate mitral valve  regurgitation. No evidence of mitral stenosis.  6. The aortic valve is normal in structure. Aortic valve regurgitation is  not visualized. Mild to moderate aortic valve sclerosis/calcification is  present, without any evidence of aortic stenosis.  7. The inferior vena cava is normal in size with greater than 50%  respiratory variability, suggesting right atrial pressure of 3 mmHg.   Patient Profile     60 y.o. male with a history of hypertension and type 2 diabetes but no known cardiac history who was admitted on 02/22/2020 for further evaluation of hematochezia. On 02/23/2020, patient had a chest pain episode with blood bowel movement. Chest pain resolved with Nitro. However, he then had recurrent chest pain and actually had a VT arrest. Received 2-5 minutes of CPR and was cardioverted. Post-resuscitation rhythm was trial fibrillation. Cardiology was consulted for further evaluation/management of VF arrest and atrial fibrillation.  Assessment & Plan    VT Arrest NSTEMI - Patient had VT arrest on 1/4 in setting of ongoing GI bleeding. ROSC obtained after about 2-5 minutes of CPR and cardioversion.  - High-sensitivity troponin peaked at >15,000 on 1/5 and has trended down. - Most recent EKG on 1/11 shows normal sinus rhythm with possible Q waves in V1-V3 and ST depression in V4-V6 as well as lead II. - Echo showed LVEF of  50-55% with hypokinesis of mid and distal anterior wall, mid and distal anterior septum, and apex as well as grade 2 diastolic dysfunction. Moderate MR and moderately elevated PASP of 48.7 mmHg also noted. - IV Heparin was started on 1/6 without bolus after clearance with GI. However, he had recurrent bleeding midnight on 1/8. Therefore, Heparin was stopped. - Patient denies any chest pain.  - Continue Aspirin.  - Has not been on beta-blocker given concern for shock. - Home statin on hold due to elevated LFTs. Can restart once these normalize. - Will ultimately need cardiac catheterization prior to discharge. Will discuss timing of this with MD.  Possible Cardiogenic Shock - COOX in the 50's on 1/5 consistent with cardiogenic shock but was 97 on last check on 1/6.  - Patient was started on pressors after VT arrest but these have all been weaned off. Has not been on any pressors since 1/8. - Patient now being diuresed with IV Lasix with excellent urinary response. Documented urinary output of 9.1 L yesterday but net positive 5.4 L this admission. I don't think documented weight from today is accurate (189 lbs today down from 224 lbs yesterday). Renal function stable.  - Mild ankle edema but otherwise does not appear significantly volume overloaded. - IV Lasix decreased to 40mg  every 8 hours today given brisk urinary response yesterday. - Continue to monitor daily weight, strict I/O's, and renal function.  Acute Hypoxic Respiratory Failure Post Arrest Requiring Intubation Suspect Aspiration due to Cardiac Arrest Acute Pulmonary Edema - Extubated on 1/11.  - Diuresis as above.  - Continue antibiotics per primary team. - Management per primary team.  Atrial Fibrillation with RVR - Post arrest rhythm with atrial fibrillation with RVR. However, he then converted to sinus rhythm.  - Maintaining sinus rhythm.  - Not an anticoagulation candidate with ongoing GI bleed.  Ventricular Escape   Prolonged QTc - Noted to have ventricular escape overnight on 1/10-1/11. EKG that night showed prolonged QTc of 529 ms.  - Resolved  with decrease in Precedex.  - Most recent EKG shows improvement in QTc to 477 ms. - No recurrent ventricular escapes.  Hypertension - BP mildly elevated.  - Home BP medications (Losartan-HCTZ, Amlodipine, Lopressor) currently on hold.  Hyperlipidemia - Home Lipitor on hold for now. - Can restart when LFTs normalize.   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 Felt to be from diverticular bleed. - EGD on 1/8 negative for bleeding or ulcer.  - Hemoglobin as low as 7.2 this admission. S/p a total of 7 units of PRBCs (last received 4 units on 1/8) Stable at 10.0 yesterday.  - Management per primary team and GI.  Hypokalemia - Potassium 3.3 this morning. Repleted by primary team. - Continue to monitor closely with diuresis.  Otherwise, per primary team: - Possible seizure like activity - Fever - Diabetes Mellitus with DKA/HHS  For questions or updates, please contact [place] Please consult [email or web address] for contact info under     Signed, Darreld Mclean, PA-C  03/02/2020, 7:07 AM    History and all data above reviewed.  Patient examined.  I agree with the findings as above.    Looks OK extubated.  No chest pain.  The patient exam reveals COR:RRR  ,  Lungs: Few basilar crackles  ,  Abd: Positive bowel sounds, no rebound no guarding, Ext Diffuse edema.    .  All available labs, radiology testing, previous records reviewed. Agree with documented assessment and plan. NSTEMI:  Cath.  Possibly Friday.  Start beta blockers.  Prolonged QT:  Avoid QT prolonging drug. Supplement potassium and magnesium.    Jeneen Rinks Kobey Sides  2:18 PM  03/02/2020

## 2020-03-02 NOTE — Progress Notes (Signed)
NAMEArsal Guzman, MRN:  [id number], DOB:  August 28, 1960, LOS: 9 ADMISSION DATE:  02/22/2020, CONSULTATION DATE:  02/23/20 REFERRING MD:  Dr Bonner Puna, CHIEF COMPLAINT:  DKA, lethargy   Brief History:  60 year old man with hypertension, diabetes admitted 1/3 with painless hematochezia / apparent lower GI bleeding, leukocytosis. Hgb 13.2 >> 11.6 with IVF. CT abdomen with angiography 1/3 did not identify active bleeding source. Has had several more episodes of BRBPR since arrival and admission.  On 1/4 he had an episode of flatus and then belching with chest discomfort.  He was treated with NTG.  Then developed VT arrest with 91min CPR with ROSC.  Labs consistent with evolving hyperglycemia and metabolic acidosis.  To ICU 1/4 pm.  IV access placed, Al[place].  Required vasopressors, rising troponin, possible cardiogenic shock.   Past Medical History:  DM  HTN   Significant Hospital Events:  1/03 Admit   1/04 Tx to SDU with GI blood loss, hyperglycemia, metabolic acidosis.  Pt c/o chest discomfort around 1745, received nitroglycerin x1.  Recurrent chest discomfort and then VT on monitor. CPR approximately 2 minutes. ET intubated by CRNA, received etomidate and succinylcholine.  Not yet on sedation. Had large-volume bright red blood per rectum at the time of arrest.  1/05 On vasopressors, climbing troponin, no further bleeding. Improved on levophed. Cardiology consulted 1/06 PSV wean 5/5, off pressors  1/8 >  Hep off at 1 am for brb pr  And colonosopy showed lots of BRB but no source, endo nl . Total 4 u prbc  1/10 -  remains on vent. Wife at bedside. Low grade fever + on abx. 30% fio2, on , precedex gtt, Fent gtt off x few hours. On TF.  Per RT  - does not cough when she suctions.  Wife is upset about his mental status but is somewhat reasured compared to yesterday. She is distraught at his lack of speedy recovery. Wondering if the hospital has facilities to help him get better. Wondering why RBC scan was not  done. Wondering if 1gm% dropi in Hgb was active bleeding. Says he was fully vibrant and man of devout faith  1/11 - No active bleeding. failed sBT yesterday with desatiuration and resp distres +/ - agitation though noted to have followed commands and distinctly was able to point who his wife was. Cards has started aspirin with ppi support) for NSTEMI after GI clearance . spech eval requested by GI when extubated and PRBC scan if active slow bleeding v CTA-A if brisky bleeding . Concern for bradycardia and escape on monitor last night while on precedex -> EKG ok with Qtc 450msec, Trop flat/improved.  fio2 30% on vent currently. oOn fent gtt an dprecedex gtt. CVP 19-20. +13.7L volume overload. Doing WUA. Reports of "posturing" overnight -> ct head ok. This AM following commands. Still with low grade fever 101F. Overall better today per RN. Wife calrmer    Consults:  GI Cardiology   Procedures:  ETT 1/4 >> 1/11 R IJ TLC 1/4 >>  Significant Diagnostic Tests:   CT abdomen/pelvis 1/3 >> hepatic steatosis, cholelithiasis and absence of GB or ductal abnormality, normal pancreas, normal ureters, mild circumferential thickening of the descending colon question infectious or inflammatory colitis with scattered diverticula no evidence of GI blood loss.  Bilateral fat-containing inguinal hernias  EEG 1/6  This study is suggestive of moderate to severe diffuse encephalopathy, nonspecific etiology. No seizures or epileptiform discharges were seen throughout the recording  Echo 1/6  EF 50-55%  wall motion abnormalities suspicious for mid LAD Grade II diastolic dysfunction  (pseudonormalization). Elevated left atrial pressure moderately elevated pulmonary artery systolic  pressure. The estimated right ventricular systolic pressure is 75.6 mmHg.  LA mildly dilated Moderate MV regurgitation  Mild to moderate aortic valve sclerosis/calcification is  present, without any evidence of aortic stenosis The mid  and distal anterior wall, mid and distal anterior septum, and apex  are hypokinetic  Upper and lower Endo 1/8 Blood in colon, no source identified, rec Nuclear study for prob divertilar source    Micro Data:  SARS CoV2 1/3 >> negative Influenza A/B 1/3 >> negative  MRSA  PCR  1/4 >  Neg  BC x 2 1/4>>  UA 1/5 >> rare bacteria/ not cultured Tracheal aspirate 1/5 >> abundant WBC >> neg BC x 2  1/7 >>> neg   Antimicrobials:  Zosyn 1/4 >> 1/12 Vanc 1/6 >> 1/8 last dose    Scheduled Meds: . aspirin  81 mg Oral Daily  . Chlorhexidine Gluconate Cloth  6 each Topical Q0600  . docusate  100 mg Per Tube BID  . feeding supplement (PROSource TF)  45 mL Per Tube TID  . furosemide  40 mg Intravenous Q8H  . insulin aspart  0-9 Units Subcutaneous Q4H  . insulin glargine  10 Units Subcutaneous BID  . pantoprazole (PROTONIX) IV  40 mg Intravenous Q12H  . polyethylene glycol  17 g Per Tube Daily  . potassium chloride  40 mEq Per Tube BID  . sodium chloride flush  10-40 mL Intracatheter Q12H   Continuous Infusions: . sodium chloride Stopped (03/02/20 0647)  . dexmedetomidine (PRECEDEX) IV infusion Stopped (03/01/20 2230)  . levETIRAcetam Stopped (03/02/20 0248)  . piperacillin-tazobactam (ZOSYN)  IV 12.5 mL/hr at 03/02/20 0959  . potassium chloride 10 mEq (03/02/20 1000)   PRN Meds:.sodium chloride, alum & mag hydroxide-simeth, nitroGLYCERIN, ondansetron **OR** ondansetron (ZOFRAN) IV, phenol, sodium chloride flush   Interim History / Subjective:    03/02/2020 diuresing well. Extubated  Yesterday and remains extubated. Down from +13 L to +3.5L l Low grade fever +. WBC 11.6K and stable. Off precedex gtt. On 2L [place] and pulse ox 100%. Eating crackers and sauce with speech - doing well. Says he is doing well. Used bipap last night only    Objective   Blood pressure (!) 161/69, pulse 79, temperature 99.9 F (37.7 C), temperature source Axillary, resp. rate 17, height 5\' 8"  (1.727 m), weight  86.1 kg, SpO2 99 %.    Vent Mode: PCV;Spontaneous;BIPAP FiO2 (%):  [30 %] 30 % Set Rate:  [12 bmp] 12 bmp PEEP:  [5 cmH20-6 cmH20] 6 cmH20 Pressure Support:  [10 cmH20] 10 cmH20   Intake/Output Summary (Last 24 hours) at 03/02/2020 1018 Last data filed at 03/02/2020 4332 Gross per 24 hour  Intake 1259.47 ml  Output 11425 ml  Net -10165.53 ml   Filed Weights   02/29/20 0406 03/01/20 0500 03/02/20 0500  Weight: 96 kg 102 kg 86.1 kg      General Appearance:  Looks cmuch better Head:  Normocephalic, without obvious abnormality, atraumatic Eyes:  PERRL - yes, conjunctiva/corneas - muddy     Ears:  Normal external ear canals, both ears Nose:  G tube - no but has [place] 2 L Throat:  ETT TUBE - no , OG tube - no Neck:  Supple,  No enlargement/tenderness/nodules Lungs: Clear to auscultation bilaterally,  Heart:  S1 and S2 normal, no murmur, CVP - no.  Pressors -  no Abdomen:  Soft, no masses, no organomegaly Genitalia / Rectal:  Not done Extremities:  Extremities- intact Skin:  ntact in exposed areas . Sacral area - not examined Neurologic:  Sedation - none -> RASS - +1 . Moves all 4s - yes. CAM-ICU - neg . Orientation - x3+. Eating      Resolved Hospital Problem list    Anion gap metabolic acidosis.   Due to DKA and also component of this was lactic acidosis, was improved with volume resuscitation and treatment of his DKA -  Resolved   Thrombocytopenia - resolrev 03/01/20  Acute resp failure needing intubation - 03/01/20  Assessment & Plan:   S/p VT Arrest 1/4 , in the setting of acute GI blood loss, presumed hyporperfusion.  ECG just prior and then post resuscitation with anterior ST elevations. A Fib + RVR post resuscitation> back to SR p amiodarone d/c'd 1/7  ? Cardiogenic Shock  - off pressoprs since 1/8 NSTEMI - pk trop 15K  03/02/2020 - not on pressors. Heparing gtt on hold due to GIB recurrentct. On aspirin since 03/01/20  Plan - aspirin per cards (GI wants PPI  concomitant)  -will likely need diagnostic cath once stabilized/ cards following    Acute Hypoxic Respiratory Failure and mechanical ventilation post-arrest s/p extubation 1/11 Suspected Aspiration due to Cardiac Arrest  Acute Pulmonary Edema    03/02/2020 - >doing well post extubation. Remains off vent x 24h. On 2L [place] - pulse ox 100%. Used bipap post extubation  Plan - dc bipap  - wean o2 off for pulse ox > 88%  Volume overload  03/02/2020 - 13.7L volume overload on 1/11 -> improved to 3.5L volume overload 03/02/20  Plan  - saline lock  -reduce lasix to daily - [facility] to work with cards on furtgher lasix  Jerking suspicious for seizure activity vs myoclonus s/p arrest  - EEG 1/7  negative for seizures but c/w TME  - ct head repeat 1/11 - neg -on empiric keppra - changed over entirely to precedex am 1/9 to see if cortical function improves enough to extubate      03/02/2020 -normal mental status off precredex  Plan  - monitor - dc precedex off MAR - keppra - long term to be deciced prior to dc by [facility]  Fever  Possible etiologies include MI, aspiration post arrest, GI source/ CNS (anoxic injury)   -consider PICC placement once patient is afebrile x 24-48 hours  >>>  D/c'd vanc 1/10 as mrsa pcr neg/ Et neg for mrsa also   03/02/2020 - still febrile but PCT trends ok and culture negative. ? Drug fever  Plan  - moniotpr - dc zosyn  - track PCT next 24h     Acute GI bleed, appears lower based on history, hematochezia - Had VT arrest on 1/4 in setting of ongoing GIB. Started on heparin gtt 1/6 evening without bolus after clearance from GI. However,  had recurrent bleeding midnight on 1/8. Stopped heparin 1AM on 1/8. Received transfusion.   - CT-A 1/3 without colitiis other than mild wall thickeing of descending color - s/p upper and lower endoscopy 1/8 no source identified other than mild gastritis, presume tic - per GI 1/10 ? diveritcular bleed   03/02/2020 - No active  bleeding. Last PRBC 4 units 02/27/20  Plan   - - PRBC for hgb </= 6.9gm%    - exceptions are   -  if ACS susepcted/confirmed then transfuse for hgb </= 8.0gm%,  or    -  active bleeding with hemodynamic instability, then transfuse regardless of hemoglobin value   At at all times try to transfuse 1 unit prbc as possible with exception of active hemorrhage  - Nuclear blood scan if concern for ongoing slow bleeding v CTA if brisk bleeding per GI    - abx per flow ssheet for colitis mild     ELectrolyte imbalance  03/02/2020   K low with lasix. Mag is < 2gm%  Plan  - repelte K (reducing lasix) - close watch on K in setting of  Lasix - replete mag by emD   Diabetes Mellitus 2 with DKA/HHS Initially required insulin infusion, transitioned off 1/5    Plan -CBG goal 140-180  -SSI, sensitive  Scale plus lantus 10 units bid   - holding home metformin, glyburide   Hx Hypertension -holding home hyzaar, metoprolol, amlodipine   Hyperlipidemia - hold home atorvastatin for now -> triad/cards to decide about restart    Best practice (evaluated daily)  Diet: NPO / TF  Pain/Anxiety/Delirium protocol (if indicated): orecedex drip  VAP protocol (if indicated): In place  DVT prophylaxis: SCD, no anticoagulation GI prophylaxis: PPI esp while on aspirin as requeste by GI 03/01/20 Glucose control: SSI plus lantus  Mobility: BR Disposition: ICU -> can move tout of ICU to tele 03/02/2020      Goals of Care:  Last date of multidisciplinary goals of care discussion:  at length with wife 1/8  Family and staff present: Wife Summary of discussion:  Pending  Follow up goals of care discussion due: wife updated 03/02/2020  Code Status: Full code    Multi-Disciplinary Goals of Care Discussion Date of Discussion 02/29/2020 and is Day 7 since admit  Primary service for patient ccm3  Location of discussion Bedside 1231  Family and Staff present Nurse Alroy Dust, wife, Dr Chase Caller  Summary of  discussion She is very distraught husband is ill and pace of recovery is slow. Explained. Will do SBT and asess for extubation (had to repeateldy explain that we are doing an asesment iwht intent to extubate and we are not yet committting to extubation).  She wants to talk to GI  Followup goals of care due by   Misc comments if any Will see if we can extend spiritual support to wife while addressing medical issues  Explained to wife geeneral nature of ICU care and tried to set expectations that it could be weeks beforfe he can go home  Offered transfer to anotehr facility if they desired       Wife updated 1/11 Patient updated 1/12 with Speech Tammy at bedside   ATTESTATION & SIGNATURE    Dr. Brand Males, M.D., [facility].C.P Pulmonary and Critical Care Medicine Staff Physician [facility] Pager: [phone], If no answer or between  15:00h - 7:00h: call [phone]  03/02/2020 10:18 AM     LABS    PULMONARY Recent Labs  Lab 02/24/20 1440 02/24/20 1819 02/25/20 0814 02/27/20 0936 03/01/20 0326  PHART  --   --   --  7.453* 7.505*  PCO2ART  --   --   --  26.0* 29.4*  PO2ART  --   --   --  66.8* 67.6*  HCO3  --   --   --  17.9* 23.0  O2SAT 55.7 53.9 97.6 94.2 95.1    CBC Recent Labs  Lab 02/29/20 0230 02/29/20 1100 02/29/20 1747 03/01/20 0624  HGB 9.8* 9.2*  9.3* 10.0*  HCT 29.8* 27.8* 28.9* 30.7*  WBC 11.7*  --  11.0* 11.6*  PLT 126*  --  135* 167    COAGULATION Recent Labs  Lab 02/27/20 0143  INR 1.3*    CARDIAC  No results for input(s): TROPONINI in the last 168 hours. No results for input(s): PROBNP in the last 168 hours.   CHEMISTRY Recent Labs  Lab 02/25/20 1743 02/26/20 0200 02/26/20 1753 02/27/20 0145 02/28/20 0512 02/29/20 0230 03/01/20 0624 03/01/20 2006 03/02/20 0446  NA  --  134*  --  134* 140 139 138 142 142  K  --  3.9  --  3.2* 3.1* 3.1* 3.3* 3.1* 3.3*  CL  --  100  --  103  109 110 108 102 102  CO2  --  23  --  17* 21* 19* 21* 27 27  GLUCOSE  --  127*  --  199* 197* 179* 276* 143* 158*  BUN  --  13  --  19 12 15 13 8 8   CREATININE  --  0.77  --  0.72 0.78 0.74 0.84 0.72 0.78  CALCIUM  --  7.8*  --  7.4* 7.4* 7.6* 7.5* 7.4* 7.7*  MG 1.8 1.9 2.1 1.9  --   --  2.0  --  1.8  PHOS 2.7 2.5 2.0*  --   --   --  2.5  --  2.8   Estimated Creatinine Clearance: 108.8 mL/min (by C-G formula based on SCr of 0.78 mg/dL).   LIVER Recent Labs  Lab 02/26/20 0200 02/27/20 0143 02/27/20 0145 03/01/20 0624  AST 756*  --  320* 46*  ALT 675*  --  536* 167*  ALKPHOS 54  --  57 75  BILITOT 1.2  --  2.1* 1.1  PROT 6.4*  --  5.4* 5.8*  ALBUMIN 3.2*  --  2.5* 2.3*  INR  --  1.3*  --   --      INFECTIOUS Recent Labs  Lab 02/24/20 1249 02/25/20 0440 02/26/20 0200 02/28/20 0512  LATICACIDVEN 3.2*  --   --   --   PROCALCITON  --  0.36 0.46 0.77     ENDOCRINE CBG (last 3)  Recent Labs    03/01/20 2309 03/02/20 0358 03/02/20 0758  GLUCAP 142* 132* 140*         IMAGING x48h  - image(s) personally visualized  -   highlighted in bold CT HEAD WO CONTRAST  Result Date: 03/01/2020 CLINICAL DATA:  60 year old male with altered mental status. New posturing. Recent respiratory failure, GI bleed, cardiac arrest EXAM: CT HEAD WITHOUT CONTRAST TECHNIQUE: Contiguous axial images were obtained from the base of the skull through the vertex without intravenous contrast. COMPARISON:  Head CT 02/25/2020. FINDINGS: Brain: No midline shift, mass effect, or evidence of intracranial mass lesion. No ventriculomegaly No acute intracranial hemorrhage identified. Gray-white matter differentiation appears stable throughout the brain and within normal limits. No cerebral edema is evident. No cortically based acute infarct identified. Faint basal ganglia dystrophic calcifications more apparent on the left are stable. Vascular: Extensive Calcified atherosclerosis at the skull base. No  suspicious intracranial vascular hyperdensity. Skull: No acute osseous abnormality identified. Sinuses/Orbits: Fairly good sinus and mastoid aeration in the setting of intubation. Mostly resolved fluid in the visible pharynx. Other: No acute orbit or scalp soft tissue finding. IMPRESSION: No acute intracranial abnormality. Stable since 02/25/2020 and largely negative non contrast CT appearance of the brain. Electronically Signed   By: Lemmie Evens  [place] Crane M.D.   On: 03/01/2020 06:12   [place] EKG SITE RITE  Result Date: 03/01/2020 If Site Rite image not attached, placement could not be confirmed due to current cardiac rhythm.

## 2020-03-02 NOTE — Progress Notes (Signed)
Modified Barium Swallow Progress Note  Patient Details  Name: Colton Guzman MRN: [id number] Date of Birth: [birth year]  Today's Date: 03/02/2020  Modified Barium Swallow completed.  Full report located under Chart Review in the Imaging Section.  Brief recommendations include the following:  Clinical Impression  Min oral and mild pharyngeal dysphagia present likely due to potential edema from pt's prolonged intubation.  He demonstrates retention of secretions in pharynx without sensation.  Mildly impaired tongue base retraction, laryngeal elevation/closure allows laryngeal penetration of liquids and vallecular retention.  Pt denies sensation to retention however will frequently propel vallecular residual into oral cavity and reswallow.  No aspiration but pt demonstrates laryngeal penetration to cords with thin.  Cued cough/throat clearing removed trace penetration.  Pharyngeal retention worse with solid than liquids - thus recommend start with full liquid diet with strict precautions.  SlP will follow up next date for tolerance and readinesss for dietary advancement.  Using teach back with live feed, pt educated to recommendations and precautions.  Of note, pt did NOT cough during the MBS, but overtly coughed and expectorated viscous secretions *minimal blood tinged* after swallowing water.  Anticipate acute dysphagia to improve with decreased pharyngeal edema and conditioning.   Swallow Evaluation Recommendations       SLP Diet Recommendations: Thin liquid (full liquid)       Medication Administration: Whole meds with puree (crush if large and not contraindicated)       Compensations: Slow rate;Small sips/bites;Other (Comment) (intermittent throat clear)       Oral Care Recommendations: Oral care QID      Kathleen Lime, MS [facility] SLP Acute Rehab Services Office [phone] Pager [phone]    Macario Golds 03/02/2020,4:10 PM

## 2020-03-02 NOTE — Progress Notes (Signed)
OT Cancellation Note  Patient Details Name: Tanush Drees MRN: [id number] DOB: 04/14/1960   Cancelled Treatment:    Reason Eval/Treat Not Completed: Patient at procedure or test/ unavailable. Patient in XR, will re-attempt 1/13 as schedule permits.  Delbert Phenix OT OT pager: [phone]   Rosemary Holms 03/02/2020, 2:46 PM

## 2020-03-02 NOTE — Progress Notes (Addendum)
PROGRESS NOTE  Colton Guzman ZOX:[id number] DOB: 07-08-1960 DOA: 02/22/2020 PCP: London Pepper, MD   LOS: 9 days   Brief narrative:  60 year old male with past medical history of hypertension, diabetes was admitted to the hospital on 1/3 with painless hematochezia . CT abdomen with angiogram he was performed on the same day which did not show any active bleeding. He did have ongoing bright red bleeding. Patient had belching, chest discomfort and flatus the next day and had VT arrest with 2 min CPR with ROSC.   Initial labs were consistent with evolving hyperglycemia and metabolic acidosis. Patient was also hypotensive and required vasopressors for cardiogenic shock.    Sequence of events  02/23/20. Patient did have cardiac arrest and was intubated.  02/24/20 patient was put on vasopressors, cardiology was consulted. 1/06 PSV wean , off pressors 1/8 >  colonoscopy showed bright red blood, total 4 u prbc given. 1/10 -  remained on vent. Continued TF.  Assessment/Plan:  Principal Problem:   Acute lower GI bleeding Active Problems:   Acute blood loss anemia   T2DM (type 2 diabetes mellitus) ([place])   Hypertension   Hyperlipidemia   Gout   Acute respiratory failure with hypoxia (HCC)   Cardiac arrest (HCC)   Shock circulatory (HCC)   Fever   Status post ventricular tachycardia/ cardiac arrest 1/4 , stable at this time. EKG prior and post resuscitation with ST elevation. Patient also had atrial fibrillation with RVR. Patient was on amiodarone. Patient received pressors for cardiogenic shock. Cardiology was consulted, initially was on heparin drip which was on hold due to GI bleed. Currently on aspirin and PPI. Cardiology planning for diagnostic cath once stabilized.  Acute hypoxic respiratory failure and mechanical ventilation post-arrest Suspected Aspiration due to Cardiac Arrest /Acute Pulmonary Edema   Status post extubation. On nasal cannula oxygen.   Volume overload Patient had  significant volume overload and has been started on IV Lasix with potassium. Closely monitor BMP.  Questionable seizure activity vs myoclonus s/p arrest   EEG 02/26/20 was negative for seizures. CT head scan was negative for acute findings. Patient was continued on Keppra empirically. Continue Keppra at this time,     Fever  Could be multifactorial. Was on vancomycin. Procalcitonin okay. MRSA PCR negative. Plan is to monitor closely.fever of 100.8 F on 03/02/2019. On Zosyn day 8. Will discontinue. We will continue to monitor off antibiotic  Acute GI bleed, hematochezia - Had VT arrest on 1/4 in setting of ongoing GIB. Started on heparin gtt1/6 eveningwithout bolus after clearance from GI. However, had recurrent bleeding midnight on 1/8. Stopped heparin on 1/8. Received transfusion. We will transfuse as necessary. CT angiogram on 02/22/2020 with mild thickening of descending colon. Patient underwent upper and lower endoscopy on 02/27/2020. Possibility of diverticular bleed as per GI  Hypokalemia. Will continue to replenish via IV.Marland Kitchen Check BMP in AM.  Diabetes Mellitus 2 with DKA/HHS Patient initially was on insulin drip. Has been transitioned at this time. Continue Lantus, sliding scale insulin. Patient is on metformin and glyburide at home.   Essential hypertension Still hyzaar, metoprolol, amlodipine on hold  Hyperlipidemia Hold atorvastatin    DVT prophylaxis: SCDs Start: 02/22/20 1838  Code Status: Full code  Family Communication: None  Status is: Inpatient  Remains inpatient appropriate because:IV treatments appropriate due to intensity of illness or inability to take PO and Inpatient level of care appropriate due to severity of illness  Dispo: The patient is from: Home  Anticipated d/c is to: Home              Anticipated d/c date is: 2 days              Patient currently is not medically stable to  d/c.  Consultants:  PCCM  Cardiology  GI  Procedures:  Intubation and mechanical ventilation  PRBC transfusion  Anti-infectives: None currently  Anti-infectives (From admission, onward)   Start     Dose/Rate Route Frequency Ordered Stop   02/25/20 2200  vancomycin (VANCOREADY) IVPB 1250 mg/250 mL  Status:  Discontinued        1,250 mg 166.7 mL/hr over 90 Minutes Intravenous Every 12 hours 02/25/20 1528 02/28/20 0857   02/25/20 2200  piperacillin-tazobactam (ZOSYN) IVPB 3.375 g        3.375 g 12.5 mL/hr over 240 Minutes Intravenous Every 8 hours 02/25/20 1528     02/25/20 1600  vancomycin (VANCOCIN) IVPB 1000 mg/200 mL premix        1,000 mg 200 mL/hr over 60 Minutes Intravenous  Once 02/25/20 1528 02/25/20 1800   02/24/20 0000  piperacillin-tazobactam (ZOSYN) IVPB 3.375 g  Status:  Discontinued        3.375 g 100 mL/hr over 30 Minutes Intravenous Every 6 hours 02/23/20 2216 02/23/20 2219   02/23/20 2300  piperacillin-tazobactam (ZOSYN) IVPB 3.375 g  Status:  Discontinued        3.375 g 12.5 mL/hr over 240 Minutes Intravenous Every 8 hours 02/23/20 2220 02/25/20 1528   02/23/20 1200  Ampicillin-Sulbactam (UNASYN) 3 g in sodium chloride 0.9 % 100 mL IVPB  Status:  Discontinued        3 g 200 mL/hr over 30 Minutes Intravenous Every 6 hours 02/23/20 1102 02/23/20 2216       Subjective: Today, patient was seen and examined at bedside. Patient denies any nausea vomiting shortness of breath cough or fever. Denies chest pain dizziness  Objective: Vitals:   03/02/20 0831 03/02/20 0832  BP:    Pulse:  83  Resp:  16  Temp: 99.9 F (37.7 C)   SpO2:  98%    Intake/Output Summary (Last 24 hours) at 03/02/2020 0948 Last data filed at 03/02/2020 0851 Gross per 24 hour  Intake 1195.33 ml  Output 11425 ml  Net -10229.67 ml   Filed Weights   02/29/20 0406 03/01/20 0500 03/02/20 0500  Weight: 96 kg 102 kg 86.1 kg   Body mass index is 28.86 kg/m.   Physical  Exam: GENERAL: Patient is alert awake and oriented. Not in obvious distress. Nasal cannula oxygen. HENT: No scleral pallor or icterus. Pupils equally reactive to light. Oral mucosa is moist NECK: is supple, no gross swelling noted. CHEST:   Diminished breath sounds bilaterally. Coarse breath sounds noted. CVS: S1 and S2 heard, no murmur. Regular rate and rhythm.  ABDOMEN: Soft, non-tender, bowel sounds are present. EXTREMITIES: Trace lower extremity edema noted CNS: Cranial nerves are intact. No focal motor deficits. SKIN: warm and dry without rashes.  Data Review: I have personally reviewed the following laboratory data and studies,  CBC: Recent Labs  Lab 02/27/20 0145 02/27/20 0700 02/28/20 0512 02/28/20 1100 02/28/20 1650 02/28/20 2230 02/29/20 0230 02/29/20 1100 02/29/20 1747 03/01/20 0624  WBC 12.1*   < > 8.0  --  9.5  --  11.7*  --  11.0* 11.6*  NEUTROABS 9.6*  --   --   --   --   --   --   --   --   --  HGB 7.2*   < > 9.3*   < > 8.6* 8.9* 9.8* 9.2* 9.3* 10.0*  HCT 22.0*   < > 28.1*   < > 25.9* 26.9* 29.8* 27.8* 28.9* 30.7*  MCV 94.8   < > 90.6  --  91.2  --  92.3  --  92.6 91.9  PLT 130*   < > 99*  --  107*  --  126*  --  135* 167   < > = values in this interval not displayed.   Basic Metabolic Panel: Recent Labs  Lab 02/25/20 1743 02/26/20 0200 02/26/20 1753 02/27/20 0145 02/28/20 0512 02/29/20 0230 03/01/20 0624 03/01/20 2006 03/02/20 0446  NA  --  134*  --  134* 140 139 138 142 142  K  --  3.9  --  3.2* 3.1* 3.1* 3.3* 3.1* 3.3*  CL  --  100  --  103 109 110 108 102 102  CO2  --  23  --  17* 21* 19* 21* 27 27  GLUCOSE  --  127*  --  199* 197* 179* 276* 143* 158*  BUN  --  13  --  19 12 15 13 8 8   CREATININE  --  0.77  --  0.72 0.78 0.74 0.84 0.72 0.78  CALCIUM  --  7.8*  --  7.4* 7.4* 7.6* 7.5* 7.4* 7.7*  MG 1.8 1.9 2.1 1.9  --   --  2.0  --  1.8  PHOS 2.7 2.5 2.0*  --   --   --  2.5  --  2.8   Liver Function Tests: Recent Labs  Lab 02/26/20 0200  02/27/20 0145 03/01/20 0624  AST 756* 320* 46*  ALT 675* 536* 167*  ALKPHOS 54 57 75  BILITOT 1.2 2.1* 1.1  PROT 6.4* 5.4* 5.8*  ALBUMIN 3.2* 2.5* 2.3*   No results for input(s): LIPASE, AMYLASE in the last 168 hours. No results for input(s): AMMONIA in the last 168 hours. Cardiac Enzymes: No results for input(s): CKTOTAL, CKMB, CKMBINDEX, TROPONINI in the last 168 hours. BNP (last 3 results) No results for input(s): BNP in the last 8760 hours.  ProBNP (last 3 results) No results for input(s): PROBNP in the last 8760 hours.  CBG: Recent Labs  Lab 03/01/20 1738 03/01/20 2000 03/01/20 2309 03/02/20 0358 03/02/20 0758  GLUCAP 119* 110* 142* 132* 140*   Recent Results (from the past 240 hour(s))  Resp Panel by RT-PCR (Flu A&B, Covid) Nasopharyngeal Swab     Status: None   Collection Time: 02/22/20  2:49 PM   Specimen: Nasopharyngeal Swab; Nasopharyngeal(NP) swabs in vial transport medium  Result Value Ref Range Status   SARS Coronavirus 2 by RT PCR NEGATIVE NEGATIVE Final    Comment: (NOTE) SARS-CoV-2 target nucleic acids are NOT DETECTED.  The SARS-CoV-2 RNA is generally detectable in upper respiratory specimens during the acute phase of infection. The lowest concentration of SARS-CoV-2 viral copies this assay can detect is 138 copies/mL. A negative result does not preclude SARS-Cov-2 infection and should not be used as the sole basis for treatment or other patient management decisions. A negative result may occur with  improper specimen collection/handling, submission of specimen other than nasopharyngeal swab, presence of viral mutation(s) within the areas targeted by this assay, and inadequate number of viral copies(<138 copies/mL). A negative result must be combined with clinical observations, patient history, and epidemiological information. The expected result is Negative.  Fact Sheet for Patients:  EntrepreneurPulse.com.au  Fact Sheet for  Healthcare Providers:  IncredibleEmployment.be  This test is no t yet approved or cleared by the [place] and  has been authorized for detection and/or diagnosis of SARS-CoV-2 by FDA under an Emergency Use Authorization (EUA). This EUA will remain  in effect (meaning this test can be used) for the duration of the COVID-19 declaration under Section 564(b)(1) of the Act, 21 U.S.C.section 360bbb-3(b)(1), unless the authorization is terminated  or revoked sooner.       Influenza A by PCR NEGATIVE NEGATIVE Final   Influenza B by PCR NEGATIVE NEGATIVE Final    Comment: (NOTE) The Xpert Xpress SARS-CoV-2/FLU/RSV plus assay is intended as an aid in the diagnosis of influenza from Nasopharyngeal swab specimens and should not be used as a sole basis for treatment. Nasal washings and aspirates are unacceptable for Xpert Xpress SARS-CoV-2/FLU/RSV testing.  Fact Sheet for Patients: EntrepreneurPulse.com.au  Fact Sheet for Healthcare Providers: IncredibleEmployment.be  This test is not yet approved or cleared by the [place] FDA and has been authorized for detection and/or diagnosis of SARS-CoV-2 by FDA under an Emergency Use Authorization (EUA). This EUA will remain in effect (meaning this test can be used) for the duration of the COVID-19 declaration under Section 564(b)(1) of the Act, 21 U.S.C. section 360bbb-3(b)(1), unless the authorization is terminated or revoked.  Performed at [facility], [address] [place], [place] [address]   MRSA PCR Screening     Status: None   Collection Time: 02/23/20  5:48 AM   Specimen: Nasopharyngeal  Result Value Ref Range Status   MRSA by PCR NEGATIVE NEGATIVE Final    Comment:        The GeneXpert MRSA Assay (FDA approved for NASAL specimens only), is one component of a comprehensive MRSA colonization surveillance program. It is not intended to diagnose  MRSA infection nor to guide or monitor treatment for MRSA infections. Performed at [facility], [place] [address], [place], [place] [address]   Culture, blood (routine x 2)     Status: None   Collection Time: 02/23/20  7:25 AM   Specimen: BLOOD  Result Value Ref Range Status   Specimen Description   Final    BLOOD LEFT ANTECUBITAL Performed at [facility] [address] [place], [place] [address]    Special Requests   Final    BOTTLES DRAWN AEROBIC AND ANAEROBIC Blood Culture adequate volume Performed at [facility] [address] [place], [place] [address]    Culture   Final    NO GROWTH 5 DAYS Performed at [facility], [place] [address] [place], [place] [address]    Report Status 02/28/2020 FINAL  Final  Culture, blood (routine x 2)     Status: None   Collection Time: 02/23/20  9:23 AM   Specimen: BLOOD RIGHT HAND  Result Value Ref Range Status   Specimen Description   Final    BLOOD RIGHT HAND Performed at [facility] [address], [place], [place] [address]    Special Requests   Final    BOTTLES DRAWN AEROBIC ONLY Blood Culture adequate volume Performed at [facility] [address], [place], [place] [address]    Culture   Final    NO GROWTH 5 DAYS Performed at [facility], [place] [address] [place], [place] [address]    Report Status 02/28/2020 FINAL  Final  Culture, respiratory (non-expectorated)  Status: None   Collection Time: 02/24/20  3:00 PM   Specimen: Tracheal Aspirate; Respiratory  Result Value Ref Range Status   Specimen Description   Final    TRACHEAL ASPIRATE Performed at [facility] [address] [place], [place] [address]    Special Requests   Final    NONE Performed at [facility], [place] [address], [place], [place] [address]    Gram Stain   Final    ABUNDANT WBC PRESENT, PREDOMINANTLY  PMN RARE GRAM POSITIVE COCCI    Culture   Final    NO GROWTH 2 DAYS Performed at [facility], [place] [address] [place], [place] [address]    Report Status 02/27/2020 FINAL  Final  Culture, blood (Routine X 2) w Reflex to ID Panel     Status: None   Collection Time: 02/26/20  5:11 AM   Specimen: BLOOD RIGHT HAND  Result Value Ref Range Status   Specimen Description   Final    BLOOD RIGHT HAND Performed at [facility] [address] [place], [place] [address]    Special Requests   Final    BOTTLES DRAWN AEROBIC ONLY Blood Culture results may not be optimal due to an inadequate volume of blood received in culture bottles Performed at [facility] [address] [place], [place] [address]    Culture   Final    NO GROWTH 5 DAYS Performed at [facility], [place] [address], [place], [place] [address]    Report Status 03/02/2020 FINAL  Final  Culture, blood (Routine X 2) w Reflex to ID Panel     Status: None   Collection Time: 02/26/20  5:11 AM   Specimen: BLOOD  Result Value Ref Range Status   Specimen Description   Final    BLOOD LEFT ARM Performed at [facility] [address], [place], [place] [address]    Special Requests   Final    BOTTLES DRAWN AEROBIC ONLY Blood Culture results may not be optimal due to an inadequate volume of blood received in culture bottles Performed at [facility] [address], [place], [place] [address]    Culture   Final    NO GROWTH 5 DAYS Performed at [facility], [place] [address], [place], [place] [address]    Report Status 03/02/2020 FINAL  Final     Studies: CT HEAD WO CONTRAST  Result Date: 03/01/2020 CLINICAL DATA:  60 year old male with altered mental status. New posturing. Recent respiratory failure, GI bleed, cardiac arrest EXAM: CT HEAD WITHOUT CONTRAST TECHNIQUE: Contiguous axial images were obtained from the base of the skull through  the vertex without intravenous contrast. COMPARISON:  Head CT 02/25/2020. FINDINGS: Brain: No midline shift, mass effect, or evidence of intracranial mass lesion. No ventriculomegaly No acute intracranial hemorrhage identified. Gray-white matter differentiation appears stable throughout the brain and within normal limits. No cerebral edema is evident. No cortically based acute infarct identified. Faint basal ganglia dystrophic calcifications more apparent on the left are stable. Vascular: Extensive Calcified atherosclerosis at the skull base. No suspicious intracranial vascular hyperdensity. Skull: No acute osseous abnormality identified. Sinuses/Orbits: Fairly good sinus and mastoid aeration in the setting of intubation. Mostly resolved fluid in the visible pharynx. Other: No acute orbit or scalp soft tissue finding. IMPRESSION: No acute intracranial abnormality. Stable since 02/25/2020 and largely negative non contrast CT appearance of the brain. Electronically Signed  By: Genevie Ann M.D.   On: 03/01/2020 06:12   [place] EKG SITE RITE  Result Date: 03/01/2020 If Site Rite image not attached, placement could not be confirmed due to current cardiac rhythm.     Flora Lipps, MD  [facility] 03/02/2020  If 7PM-7AM, please contact night-coverage

## 2020-03-02 NOTE — Evaluation (Signed)
Clinical/Bedside Swallow Evaluation Patient Details  Name: Colton Guzman MRN: [id number] Date of Birth: [birth year]  Today's Date: 03/02/2020 Time:        Past Medical History:  Past Medical History:  Diagnosis Date  . Diabetes mellitus without complication ([place])   . Hypertension    Past Surgical History:  Past Surgical History:  Procedure Laterality Date  . COLONOSCOPY WITH PROPOFOL Left 02/27/2020   Procedure: COLONOSCOPY WITH PROPOFOL;  Surgeon: Otis Brace, MD;  Location: [facility];  Service: Gastroenterology;  Laterality: Left;  . ESOPHAGOGASTRODUODENOSCOPY (EGD) WITH PROPOFOL N/A 02/27/2020   Procedure: ESOPHAGOGASTRODUODENOSCOPY (EGD) WITH PROPOFOL;  Surgeon: Otis Brace, MD;  Location: [facility];  Service: Gastroenterology;  Laterality: N/A;   HPI:  pt is a 60 yo male adm with GI bleed.  Pt underwent endoscopy and after had AMS, ? seizure activity with cardiac arrest requiring cardioversion and CPR.  Intubated 1/5 to 03/01/2020. Swallow eval ordered.  Pt also has h/o DM.  His CXR showed multifocal pna vs edema.  He is currently on room air.  Pt admits to some issues with swallowing prior to admit but does not expand on information.   Assessment / Plan / Recommendation Clinical Impression  Pt presents with clinical indications of a reversible acute dysphagia due to prolonged intubation.  Cough noted with approx 80% of thin liquid swallows and pt did not pass the 3 ounce water challenge.  Pt is dysphonic - likely due to laryngeal edema.  After water intake, he continued to cough after approx 20% swallows of other boluses including nectar, applesauce and graham cracker.  Cough is strong fortunately and pt is able to expectorate secretions however SLP can not rule out silent dysphagia due to intubation.   He did not demonstrate drop in oxygen saturations or increase in work of breathing fortunately. Pt also admits to sensing retention in pharynx and was frequently belching  admitting to currently refluxing.  He denies premorbid reflux. Anticipate pt will tolerate a po diet but may need to be modified short term.   Recommend medications be given with applesauce and ice chip intake and proceed to MBS to allow view of swallowing musculature/adequate clearance, etc.  Pt and RN agreeeable to plan. SLP Visit Diagnosis: Dysphagia, pharyngeal phase (R13.13);Dysphagia, pharyngoesophageal phase (R13.14)    Aspiration Risk  Moderate aspiration risk    Diet Recommendation NPO except meds;Ice chips PRN after oral care   Medication Administration: Whole meds with puree Supervision: Patient able to self feed Compensations: Slow rate;Small sips/bites Postural Changes: Seated upright at 90 degrees;Remain upright for at least 30 minutes after po intake    Other  Recommendations Oral Care Recommendations: Oral care QID   Follow up Recommendations  (tbd)      Frequency and Duration     tbd       Prognosis   tbd     Swallow Study   General Date of Onset: 03/02/20 HPI: pt is a 60 yo male adm with GI bleed.  Pt underwent endoscopy and after had AMS, ? seizure activity with cardiac arrest requiring cardioversion and CPR.  Intubated 1/5 to 03/01/2020. Swallow eval ordered.  Pt also has h/o DM.  His CXR showed multifocal pna vs edema.  He is currently on room air.  Pt admits to some issues with swallowing prior to admit but does not expand on information. Type of Study: Bedside Swallow Evaluation Previous Swallow Assessment: none Diet Prior to this Study: NPO Respiratory Status: Room air History of Recent  Intubation: No Behavior/Cognition: Alert;Cooperative;Pleasant mood Oral Cavity Assessment: Within Functional Limits Oral Care Completed by SLP: No Oral Cavity - Dentition: Dentures, top;Dentures, bottom (upper denture- full , lower partial) Vision: Functional for self-feeding Self-Feeding Abilities: Able to feed self Patient Positioning: Upright in bed Baseline Vocal  Quality: Low vocal intensity Volitional Cough: Strong (productive) Volitional Swallow: Able to elicit    Oral/Motor/Sensory Function Overall Oral Motor/Sensory Function: Within functional limits   Ice Chips Ice chips: Not tested   Thin Liquid Thin Liquid: Impaired Presentation: Cup;Self Fed Pharyngeal  Phase Impairments: Cough - Immediate Other Comments: cough immediately post-3 ounce water challenge, pt continued with cough during intake with approx 80% of thin water boluses    Nectar Thick Nectar Thick Liquid: Impaired Presentation: Cup;Self Fed;Straw Pharyngeal Phase Impairments: Cough - Delayed;Throat Clearing - Immediate Other Comments: delayed cough with approx 20% of boluses   Honey Thick Honey Thick Liquid: Not tested   Puree Puree: Impaired Presentation: Self Fed;Spoon Pharyngeal Phase Impairments: Cough - Delayed   Solid     Solid: Impaired Presentation: Self Fed Pharyngeal Phase Impairments: Cough - Delayed      Macario Golds 03/02/2020,11:02 AM   Kathleen Lime, MS [place] Office [phone] Pager [phone]

## 2020-03-02 NOTE — Progress Notes (Signed)
Nutrition Follow-up  RD working remotely.  DOCUMENTATION CODES:   Not applicable  INTERVENTION:  - diet advancement as medically feasible.   NUTRITION DIAGNOSIS:   Inadequate oral intake related to inability to eat as evidenced by NPO status. -ongoing  GOAL:   Patient will meet greater than or equal to 90% of their needs -unable to meet at this time  MONITOR:   Diet advancement,Labs,Weight trends  ASSESSMENT:   60 y.o. male with medical history of HTN and type 2 DM. He presented to the ED on 1/3 following hematochezia at 0345 that AM followed by 3 additional episodes. In the ED, FOBT was positive.  Patient was intubated yesterday at ~1515 and OGT removed at that time. He remains NPO. Estimated nutrition needs updated.   IV lasix order in place and patient is currently -4 lb compared to admission (1/3) weight.   He is being followed by Cardiology and GI.     Labs reviewed; CBG: 132, 140 mg/dl, K: 3.3 mmol/l, Ca: 7.7 mg/dl. Medications reviewed; 100 mg colace BID, 40 mg IV lasix TID, sliding scale novolog, 10 units lantus BID, 1 g IV Mg sulfate x1 run 1/12, 40 mg IV protonix BID, 17 g miralax/day, 40 mEq Klor-Con BID, 10 mEq IV KCl x4 runs 1/11 and x4 runs 1/12.   Diet Order:   Diet Order            Diet NPO time specified  Diet effective now                 EDUCATION NEEDS:   Not appropriate for education at this time  Skin:  Skin Assessment: Reviewed RN Assessment  Last BM:  1/11 (type 6)  Height:   Ht Readings from Last 1 Encounters:  02/28/20 5\' 8"  (1.727 m)    Weight:   Wt Readings from Last 1 Encounters:  03/02/20 86.1 kg    Estimated Nutritional Needs:  Kcal:  2050-2250 Protein:  90-105 grams Fluid:  >/= 2.2 L/day     Jarome Matin, MS, RD, LDN, CNSC Inpatient Clinical Dietitian RD pager # available in [facility]  After hours/weekend pager # available in [facility]

## 2020-03-02 NOTE — TOC Progression Note (Signed)
Transition of Care [facility]) - Progression Note    Patient Details  Name: Colton Guzman MRN: [id number] Date of Birth: [birth year]  Transition of Care [facility]) CM/SW Contact  Leeroy Cha, RN Phone Number: 03/02/2020, 8:20 AM  Clinical Narrative:    Extubated yesterday. Patient states he is feeling OK this morning. No pain anywhere. No chest pain. He does not feel short of breath. Only on nasal cannula. No more bloody stools. PROGRESSION:TERMP 100.8, BIPAP AT HS 3LNC DAY. PLAN: TO RETURN TO HOME  Expected Discharge Plan: Home/Self Care Barriers to Discharge: Continued Medical Work up  Expected Discharge Plan and Services Expected Discharge Plan: Home/Self Care   Discharge Planning Services: CM Consult   Living arrangements for the past 2 months: Single Family Home                                       Social Determinants of Health (SDOH) Interventions    Readmission Risk Interventions No flowsheet data found.

## 2020-03-03 DIAGNOSIS — K922 Gastrointestinal hemorrhage, unspecified: Secondary | ICD-10-CM | POA: Diagnosis not present

## 2020-03-03 LAB — GLUCOSE, CAPILLARY
Glucose-Capillary: 118 mg/dL — ABNORMAL HIGH (ref 70–99)
Glucose-Capillary: 145 mg/dL — ABNORMAL HIGH (ref 70–99)
Glucose-Capillary: 147 mg/dL — ABNORMAL HIGH (ref 70–99)
Glucose-Capillary: 157 mg/dL — ABNORMAL HIGH (ref 70–99)
Glucose-Capillary: 168 mg/dL — ABNORMAL HIGH (ref 70–99)
Glucose-Capillary: 204 mg/dL — ABNORMAL HIGH (ref 70–99)
Glucose-Capillary: 268 mg/dL — ABNORMAL HIGH (ref 70–99)

## 2020-03-03 LAB — COMPREHENSIVE METABOLIC PANEL
ALT: 111 U/L — ABNORMAL HIGH (ref 0–44)
AST: 39 U/L (ref 15–41)
Albumin: 2.6 g/dL — ABNORMAL LOW (ref 3.5–5.0)
Alkaline Phosphatase: 77 U/L (ref 38–126)
Anion gap: 10 (ref 5–15)
BUN: 6 mg/dL (ref 6–20)
CO2: 29 mmol/L (ref 22–32)
Calcium: 8.2 mg/dL — ABNORMAL LOW (ref 8.9–10.3)
Chloride: 100 mmol/L (ref 98–111)
Creatinine, Ser: 0.8 mg/dL (ref 0.61–1.24)
GFR, Estimated: >60 mL/min (ref >60)
Glucose, Bld: 168 mg/dL — ABNORMAL HIGH (ref 70–99)
Potassium: 3.2 mmol/L — ABNORMAL LOW (ref 3.5–5.1)
Sodium: 139 mmol/L (ref 135–145)
Total Bilirubin: 1.2 mg/dL (ref 0.3–1.2)
Total Protein: 6.6 g/dL (ref 6.5–8.1)

## 2020-03-03 LAB — CBC
HCT: 35.8 % — ABNORMAL LOW (ref 39.0–52.0)
Hemoglobin: 11.3 g/dL — ABNORMAL LOW (ref 13.0–17.0)
MCH: 29.7 pg (ref 26.0–34.0)
MCHC: 31.6 g/dL (ref 30.0–36.0)
MCV: 94 fL (ref 80.0–100.0)
Platelets: 234 10*3/uL (ref 150–400)
RBC: 3.81 MIL/uL — ABNORMAL LOW (ref 4.22–5.81)
RDW: 17.4 % — ABNORMAL HIGH (ref 11.5–15.5)
WBC: 7.9 10*3/uL (ref 4.0–10.5)
nRBC: 0 % (ref 0.0–0.2)

## 2020-03-03 LAB — PHOSPHORUS: Phosphorus: 3 mg/dL (ref 2.5–4.6)

## 2020-03-03 LAB — PROCALCITONIN: Procalcitonin: <0.1 ng/mL

## 2020-03-03 LAB — MAGNESIUM: Magnesium: 2.3 mg/dL (ref 1.7–2.4)

## 2020-03-03 MED ORDER — POTASSIUM CHLORIDE CRYS ER 20 MEQ PO TBCR
40.0000 meq | EXTENDED_RELEASE_TABLET | Freq: Once | ORAL | Status: AC
  Administered 2020-03-03: 40 meq via ORAL
  Filled 2020-03-03: qty 2

## 2020-03-03 MED ORDER — POTASSIUM CHLORIDE CRYS ER 20 MEQ PO TBCR
40.0000 meq | EXTENDED_RELEASE_TABLET | Freq: Two times a day (BID) | ORAL | Status: DC
  Administered 2020-03-03 (×2): 40 meq via ORAL
  Filled 2020-03-03 (×2): qty 2

## 2020-03-03 NOTE — Progress Notes (Addendum)
  Speech Language Pathology Treatment:    Patient Details Name: Colton Guzman MRN: [id number] DOB: [birth year] Today's Date: 03/03/2020 Time: 4037-0964 SLP Time Calculation (min) (ACUTE ONLY): 15 min  Assessment / Plan / Recommendation Clinical Impression  Today pt sitting upright in chair with wife present. His voice is subjectively much stronger than yesterday.  Pt and wife deny him coughing during intake today. Acknowledge some cough - not associated with po - and expectorating some secretions.   Observed pt with consumption of [employer] cookies and water.  NO Indication of aspiration and pt uses caution with eating - masticating well.  Suspect his acute dysphagia from his prolonged intubation is resolving and likely his swallow is close to normal.  Reviewed MBS video loops with pt and his wife and reiterated importance of his cough strength and expectoration ability.    Recommend advance his diet to mechanical soft/thin - after his cardiac cath - and advance to regular/thin when dc'd home and managing.    Fortunately given pt's youth, lack of premorbid dysphagia, strength of cough/expectoration - his asp pna risk appears low.  Reinforced to pt recommendation for use of incentive spirometer.  All education completed and no SLP follow up needed. Thanks for allowing me to help with this pt's care.    HPI HPI: pt is a 60 yo male adm with GI bleed.  Pt underwent endoscopy and after had AMS, ? seizure activity with cardiac arrest requiring cardioversion and CPR.  Intubated 1/5 to 03/01/2020. Swallow eval ordered.  Pt also has h/o DM.  His CXR showed multifocal pna vs edema.  He is currently on room air. Pt underwent MBS yesterday and was started on full liquid diet with precautions. Follow up needed to determine po tolerance and readiness for dietary advancement.      SLP Plan  All goals met       Recommendations  Diet recommendations: Dysphagia 3 (mechanical soft);Thin liquid Liquids provided  via: Cup;Straw Medication Administration: Whole meds with puree Compensations: Slow rate;Small sips/bites Postural Changes and/or Swallow Maneuvers: Seated upright 90 degrees;Upright 30-60 min after meal                Oral Care Recommendations: Oral care QID Follow up Recommendations:  NONE SLP Visit Diagnosis: Dysphagia, pharyngeal phase (R13.13);Dysphagia, pharyngoesophageal phase (R13.14) Plan: All goals met       GO               Kathleen Lime, MS [facility] SLP Acute Rehab Services Office [phone] Pager [phone]   Macario Golds 03/03/2020, 7:22 PM

## 2020-03-03 NOTE — Progress Notes (Signed)
PROGRESS NOTE  Colton Guzman KZS:[id number] DOB: 12/25/1960 DOA: 02/22/2020 PCP: London Pepper, MD   LOS: 10 days   Brief narrative:  60 year old male with past medical history of hypertension, diabetes mellitus was admitted to the hospital on 02/22/20 with painless hematochezia . CT abdomen with angiogram he was performed on the same day which did not show any active bleeding. He did have ongoing bright red bleeding. Patient had belching, chest discomfort and flatus the next day and had VT arrest with 2 min CPR with ROSC.   Initial labs were consistent with evolving hyperglycemia and metabolic acidosis. Patient was also hypotensive and required vasopressors for cardiogenic shock.    Sequence of events  02/23/20. Patient did have cardiac arrest and was intubated.  02/24/20 patient was put on vasopressors, cardiology was consulted. 1/06 PSV wean , off pressors 1/8 >  colonoscopy showed bright red blood, total 4 u prbc given. 1/10 -  remained on vent. Continued TF.  Assessment/Plan:  Principal Problem:   Acute lower GI bleeding Active Problems:   Acute blood loss anemia   T2DM (type 2 diabetes mellitus) ([place])   Hypertension   Hyperlipidemia   Gout   Acute respiratory failure with hypoxia (HCC)   Cardiac arrest (HCC)   Shock circulatory (HCC)   Fever   Status post ventricular tachycardia/ cardiac arrest 1/4 , stable at this time. Patient also had atrial fibrillation with RVR. Patient was on amiodarone. Patient received pressors for cardiogenic shock. Cardiology was consulted, initially was on heparin drip which was on hold due to GI bleed. Currently on aspirin and PPI. Cardiology planning for diagnostic cath  likely tomorrow.  Accelerated idioventricular rhythm overnight.  Acute hypoxic respiratory failure and mechanical ventilation post-arrest Suspected Aspiration due to Cardiac Arrest /Acute Pulmonary Edema   Status post extubation.  Off nasal cannula oxygen this  morning.  Volume overload Patient had significant volume overload and has been started on IV Lasix with potassium. Closely monitor BMP.  Patient is positive for 2000 mL at this time  Questionable seizure activity vs myoclonus s/p arrest   EEG 02/26/20 was negative for seizures. CT head scan was negative for acute findings. Patient was continued on Keppra empirically. Continue Keppra at this time,     Fever  Trended down after removing central catheter.  Catheter tip culture pending.  Procalcitonin okay. MRSA PCR negative.  Off antibiotic at this time.  Check CBC.  Acute GI bleed, hematochezia - Had VT arrest on 1/4 in setting of ongoing GIB. Started on heparin gtt1/6 eveningwithout bolus after clearance from GI. However, had recurrent bleeding midnight on 1/8. Stopped heparin on 1/8. Received PRBC transfusion. We will transfuse as necessary for hemoglobin less than 7. CT angiogram on 02/22/2020 with mild thickening of descending colon. Patient underwent upper and lower endoscopy on 02/27/2020. Possibility of diverticular bleed as per GI.  Monitor CBC.  Hypokalemia. Will continue to replenish.  Currently getting p.o. potassium twice daily.  Check BMP in AM.  0.3.  Diabetes Mellitus 2 with DKA/HHS Patient initially was on insulin drip. Has been transitioned to Lantus, sliding scale insulin. Patient is on metformin and glyburide at home.  Latest POC glucose of 157  Essential hypertension Currently on metoprolol  Hyperlipidemia atorvastatin on hold.   DVT prophylaxis: SCDs Start: 02/22/20 1838  Code Status: Full code  Family Communication: Spoke with the patient's wife at bedside today and answered all the queries.  Status is: Inpatient  Remains inpatient appropriate because:IV treatments appropriate due to  intensity of illness or inability to take PO and Inpatient level of care appropriate due to severity of illness  Dispo: The patient is from: Home              Anticipated d/c  is to: [facility] as per PT evaluation.              Anticipated d/c date is: 2 days              Patient currently is not medically stable to d/c.  Consultants:  PCCM  Cardiology  GI  Procedures:  Intubation and mechanical ventilation  PRBC transfusion  Anti-infectives: None currently  Anti-infectives (From admission, onward)   Start     Dose/Rate Route Frequency Ordered Stop   02/25/20 2200  vancomycin (VANCOREADY) IVPB 1250 mg/250 mL  Status:  Discontinued        1,250 mg 166.7 mL/hr over 90 Minutes Intravenous Every 12 hours 02/25/20 1528 02/28/20 0857   02/25/20 2200  piperacillin-tazobactam (ZOSYN) IVPB 3.375 g  Status:  Discontinued        3.375 g 12.5 mL/hr over 240 Minutes Intravenous Every 8 hours 02/25/20 1528 03/02/20 1047   02/25/20 1600  vancomycin (VANCOCIN) IVPB 1000 mg/200 mL premix        1,000 mg 200 mL/hr over 60 Minutes Intravenous  Once 02/25/20 1528 02/25/20 1800   02/24/20 0000  piperacillin-tazobactam (ZOSYN) IVPB 3.375 g  Status:  Discontinued        3.375 g 100 mL/hr over 30 Minutes Intravenous Every 6 hours 02/23/20 2216 02/23/20 2219   02/23/20 2300  piperacillin-tazobactam (ZOSYN) IVPB 3.375 g  Status:  Discontinued        3.375 g 12.5 mL/hr over 240 Minutes Intravenous Every 8 hours 02/23/20 2220 02/25/20 1528   02/23/20 1200  Ampicillin-Sulbactam (UNASYN) 3 g in sodium chloride 0.9 % 100 mL IVPB  Status:  Discontinued        3 g 200 mL/hr over 30 Minutes Intravenous Every 6 hours 02/23/20 1102 02/23/20 2216     Subjective: Today, was seen and examined at bedside.  Denies any nausea vomiting abdominal pain.  Denies any chest pain or shortness of breath.  Patient's wife at bedside.  Objective: Vitals:   03/03/20 0400 03/03/20 0800  BP: (!) 167/86 (!) 152/87  Pulse: 73   Resp: (!) 22 (!) 27  Temp: 98.2 F (36.8 C)   SpO2: 96%     Intake/Output Summary (Last 24 hours) at 03/03/2020 1137 Last data filed at 03/03/2020 1100 Gross per 24 hour   Intake 240 ml  Output 1800 ml  Net -1560 ml   Filed Weights   02/29/20 0406 03/01/20 0500 03/02/20 0500  Weight: 96 kg 102 kg 86.1 kg   Body mass index is 28.86 kg/m.   Physical Exam: GENERAL: Patient is alert awake and oriented. Not in obvious distress.  HENT: No scleral pallor or icterus. Pupils equally reactive to light. Oral mucosa is moist NECK: is supple, no gross swelling noted. CHEST:   Diminished breath sounds bilaterally.  No wheezes or crackles noted CVS: S1 and S2 heard, systolic murmur noted regular rate and rhythm.  ABDOMEN: Soft, non-tender, bowel sounds are present. EXTREMITIES: Trace lower extremity edema noted CNS: Cranial nerves are intact. No focal motor deficits. SKIN: warm and dry without rashes.  Data Review: I have personally reviewed the following laboratory data and studies,  CBC: Recent Labs  Lab 02/27/20 0145 02/27/20 0700 02/28/20 1650 02/28/20 2230 02/29/20  0230 02/29/20 1100 02/29/20 1747 03/01/20 0624 03/03/20 0610  WBC 12.1*   < > 9.5  --  11.7*  --  11.0* 11.6* 7.9  NEUTROABS 9.6*  --   --   --   --   --   --   --   --   HGB 7.2*   < > 8.6*   < > 9.8* 9.2* 9.3* 10.0* 11.3*  HCT 22.0*   < > 25.9*   < > 29.8* 27.8* 28.9* 30.7* 35.8*  MCV 94.8   < > 91.2  --  92.3  --  92.6 91.9 94.0  PLT 130*   < > 107*  --  126*  --  135* 167 234   < > = values in this interval not displayed.   Basic Metabolic Panel: Recent Labs  Lab 02/26/20 0200 02/26/20 1753 02/27/20 0145 02/28/20 0512 02/29/20 0230 03/01/20 0624 03/01/20 2006 03/02/20 0446 03/03/20 0610  NA 134*  --  134*   < > 139 138 142 142 139  K 3.9  --  3.2*   < > 3.1* 3.3* 3.1* 3.3* 3.2*  CL 100  --  103   < > 110 108 102 102 100  CO2 23  --  17*   < > 19* 21* 27 27 29   GLUCOSE 127*  --  199*   < > 179* 276* 143* 158* 168*  BUN 13  --  19   < > 15 13 8 8 6   CREATININE 0.77  --  0.72   < > 0.74 0.84 0.72 0.78 0.80  CALCIUM 7.8*  --  7.4*   < > 7.6* 7.5* 7.4* 7.7* 8.2*  MG 1.9  2.1 1.9  --   --  2.0  --  1.8 2.3  PHOS 2.5 2.0*  --   --   --  2.5  --  2.8 3.0   < > = values in this interval not displayed.   Liver Function Tests: Recent Labs  Lab 02/26/20 0200 02/27/20 0145 03/01/20 0624 03/03/20 0610  AST 756* 320* 46* 39  ALT 675* 536* 167* 111*  ALKPHOS 54 57 75 77  BILITOT 1.2 2.1* 1.1 1.2  PROT 6.4* 5.4* 5.8* 6.6  ALBUMIN 3.2* 2.5* 2.3* 2.6*   No results for input(s): LIPASE, AMYLASE in the last 168 hours. No results for input(s): AMMONIA in the last 168 hours. Cardiac Enzymes: No results for input(s): CKTOTAL, CKMB, CKMBINDEX, TROPONINI in the last 168 hours. BNP (last 3 results) No results for input(s): BNP in the last 8760 hours.  ProBNP (last 3 results) No results for input(s): PROBNP in the last 8760 hours.  CBG: Recent Labs  Lab 03/02/20 1250 03/02/20 1639 03/03/20 0045 03/03/20 0417 03/03/20 0742  GLUCAP 220* 159* 168* 147* 157*   Recent Results (from the past 240 hour(s))  Resp Panel by RT-PCR (Flu A&B, Covid) Nasopharyngeal Swab     Status: None   Collection Time: 02/22/20  2:49 PM   Specimen: Nasopharyngeal Swab; Nasopharyngeal(NP) swabs in vial transport medium  Result Value Ref Range Status   SARS Coronavirus 2 by RT PCR NEGATIVE NEGATIVE Final    Comment: (NOTE) SARS-CoV-2 target nucleic acids are NOT DETECTED.  The SARS-CoV-2 RNA is generally detectable in upper respiratory specimens during the acute phase of infection. The lowest concentration of SARS-CoV-2 viral copies this assay can detect is 138 copies/mL. A negative result does not preclude SARS-Cov-2 infection and should not be used as the  sole basis for treatment or other patient management decisions. A negative result may occur with  improper specimen collection/handling, submission of specimen other than nasopharyngeal swab, presence of viral mutation(s) within the areas targeted by this assay, and inadequate number of viral copies(<138 copies/mL). A negative  result must be combined with clinical observations, patient history, and epidemiological information. The expected result is Negative.  Fact Sheet for Patients:  EntrepreneurPulse.com.au  Fact Sheet for Healthcare Providers:  IncredibleEmployment.be  This test is no t yet approved or cleared by the [place] FDA and  has been authorized for detection and/or diagnosis of SARS-CoV-2 by FDA under an Emergency Use Authorization (EUA). This EUA will remain  in effect (meaning this test can be used) for the duration of the COVID-19 declaration under Section 564(b)(1) of the Act, 21 U.S.C.section 360bbb-3(b)(1), unless the authorization is terminated  or revoked sooner.       Influenza A by PCR NEGATIVE NEGATIVE Final   Influenza B by PCR NEGATIVE NEGATIVE Final    Comment: (NOTE) The Xpert Xpress SARS-CoV-2/FLU/RSV plus assay is intended as an aid in the diagnosis of influenza from Nasopharyngeal swab specimens and should not be used as a sole basis for treatment. Nasal washings and aspirates are unacceptable for Xpert Xpress SARS-CoV-2/FLU/RSV testing.  Fact Sheet for Patients: EntrepreneurPulse.com.au  Fact Sheet for Healthcare Providers: IncredibleEmployment.be  This test is not yet approved or cleared by the [place] FDA and has been authorized for detection and/or diagnosis of SARS-CoV-2 by FDA under an Emergency Use Authorization (EUA). This EUA will remain in effect (meaning this test can be used) for the duration of the COVID-19 declaration under Section 564(b)(1) of the Act, 21 U.S.C. section 360bbb-3(b)(1), unless the authorization is terminated or revoked.  Performed at [facility], [address] [place], [place] [address]   MRSA PCR Screening     Status: None   Collection Time: 02/23/20  5:48 AM   Specimen: Nasopharyngeal  Result Value Ref Range Status   MRSA by PCR  NEGATIVE NEGATIVE Final    Comment:        The GeneXpert MRSA Assay (FDA approved for NASAL specimens only), is one component of a comprehensive MRSA colonization surveillance program. It is not intended to diagnose MRSA infection nor to guide or monitor treatment for MRSA infections. Performed at [facility], [place] [address] [place], [place] [address]   Culture, blood (routine x 2)     Status: None   Collection Time: 02/23/20  7:25 AM   Specimen: BLOOD  Result Value Ref Range Status   Specimen Description   Final    BLOOD LEFT ANTECUBITAL Performed at [place] [address] [place], [place] [address]    Special Requests   Final    BOTTLES DRAWN AEROBIC AND ANAEROBIC Blood Culture adequate volume Performed at [place] [address] [place], [place] [address]    Culture   Final    NO GROWTH 5 DAYS Performed at [facility], [place] [address], [place], [place] [address]    Report Status 02/28/2020 FINAL  Final  Culture, blood (routine x 2)     Status: None   Collection Time: 02/23/20  9:23 AM   Specimen: BLOOD RIGHT HAND  Result Value Ref Range Status   Specimen Description   Final    BLOOD RIGHT HAND Performed at [place] [address], [place],  [address]  Special Requests   Final    BOTTLES DRAWN AEROBIC ONLY Blood Culture adequate volume Performed at [place] [address], [place], [place] [address]    Culture   Final    NO GROWTH 5 DAYS Performed at [facility], [place] [address] [place], [place] [address]    Report Status 02/28/2020 FINAL  Final  Culture, respiratory (non-expectorated)     Status: None   Collection Time: 02/24/20  3:00 PM   Specimen: Tracheal Aspirate; Respiratory  Result Value Ref Range Status   Specimen Description   Final    TRACHEAL ASPIRATE Performed at [place] [address], [place], [place] [address]    Special Requests   Final    NONE Performed at [facility], [place] [address], [place], [place] [address]    Gram Stain   Final    ABUNDANT WBC PRESENT, PREDOMINANTLY PMN RARE GRAM POSITIVE COCCI    Culture   Final    NO GROWTH 2 DAYS Performed at [facility], [place] [address] [place], [place] [address]    Report Status 02/27/2020 FINAL  Final  Culture, blood (Routine X 2) w Reflex to ID Panel     Status: None   Collection Time: 02/26/20  5:11 AM   Specimen: BLOOD RIGHT HAND  Result Value Ref Range Status   Specimen Description   Final    BLOOD RIGHT HAND Performed at [place] [address], [place], [place] [address]    Special Requests   Final    BOTTLES DRAWN AEROBIC ONLY Blood Culture results may not be optimal due to an inadequate volume of blood received in culture bottles Performed at [place] [address] [place], [place] [address]    Culture   Final    NO GROWTH 5 DAYS Performed at [facility], [place] [address] [place], [place] [address]    Report Status 03/02/2020 FINAL  Final  Culture, blood (Routine X 2) w Reflex to ID Panel     Status: None   Collection Time: 02/26/20  5:11 AM   Specimen: BLOOD  Result Value Ref Range Status   Specimen Description   Final    BLOOD LEFT ARM Performed at [place] [address], [place],  [address]    Special Requests   Final    BOTTLES DRAWN AEROBIC ONLY Blood Culture results may not be optimal due to an inadequate volume of blood received in culture bottles Performed at [place] [address], [place],  [address]    Culture   Final    NO GROWTH 5 DAYS Performed at [facility], [place] [address], [place],  [address]    Report Status 03/02/2020 FINAL  Final     Studies: DG Swallowing Func-Speech Pathology  Result Date:  03/02/2020 Objective Swallowing Evaluation: Type of Study: Bedside Swallow Evaluation  Patient Details Name: Quintin Hjort MRN: [id number] Date of Birth: 08-31-1960 Today's Date: 03/02/2020 Time: SLP Start Time (ACUTE ONLY): 1455 -SLP Stop Time (ACUTE ONLY): 8891 SLP Time Calculation (min) (ACUTE ONLY): 40 min Past Medical History: Past Medical History: Diagnosis Date . Diabetes mellitus without complication ([place])  . Hypertension  Past Surgical History: Past Surgical History: Procedure Laterality Date . COLONOSCOPY WITH PROPOFOL Left 02/27/2020  Procedure: COLONOSCOPY WITH PROPOFOL;  Surgeon: Otis Brace, MD;  Location: [facility];  Service: Gastroenterology;  Laterality: Left; . ESOPHAGOGASTRODUODENOSCOPY (EGD) WITH PROPOFOL N/A 02/27/2020  Procedure: ESOPHAGOGASTRODUODENOSCOPY (EGD) WITH PROPOFOL;  Surgeon: Otis Brace, MD;  Location: [facility];  Service: Gastroenterology;  Laterality: N/A; HPI: pt is a 60 yo male adm with GI bleed.  Pt underwent endoscopy and after had AMS, ? seizure activity with cardiac arrest requiring cardioversion and CPR.  Intubated 1/5 to 03/01/2020. Swallow eval ordered.  Pt also has h/o DM.  His CXR showed multifocal pna vs edema.  He is currently on room air.  Pt admits to some issues with swallowing prior to admit but does not expand on information.  Subjective: pt awake in chair Assessment / Plan / Recommendation CHL IP CLINICAL IMPRESSIONS 03/02/2020 Clinical Impression Min oral and mild pharyngeal dysphagia present likely due to potential edema from pt's prolonged intubation.  He demonstrates retention of secretions in pharynx without sensation.  Mildly impaired tongue base retraction, laryngeal elevation/closure allows laryngeal penetration of liquids and vallecular retention.  Pt denies sensation to retention however will frequently propel vallecular residual into oral cavity and reswallow.  No aspiration but pt demonstrates laryngeal penetration to cords with thin.  Cued  cough/throat clearing removed trace penetration.  Pharyngeal retention worse with solid than liquids - thus recommend start with full liquid diet with strict precautions.  SlP will follow up next date for tolerance and readinesss for dietary advancement.  Using teach back with live feed, pt educated to recommendations and precautions.  Of note, pt did NOT cough during the MBS, but overtly coughed and expectorated viscous secretions *minimal blood tinged* after swallowing water.  Anticipate acute dysphagia to improve with decreased pharyngeal edema and conditioning. SLP Visit Diagnosis Dysphagia, pharyngeal phase (R13.13);Dysphagia, pharyngoesophageal phase (R13.14) Attention and concentration deficit following -- Frontal lobe and executive function deficit following -- Impact on safety and function Mild aspiration risk   CHL IP TREATMENT RECOMMENDATION 03/02/2020 Treatment Recommendations Therapy as outlined in treatment plan below   Prognosis 03/02/2020 Prognosis for Safe Diet Advancement Fair Barriers to Reach Goals -- Barriers/Prognosis Comment -- CHL IP DIET RECOMMENDATION 03/02/2020 SLP Diet Recommendations Thin liquid Liquid Administration via -- Medication Administration Whole meds with puree Compensations Slow rate;Small sips/bites;Other (Comment) Postural Changes --   CHL IP OTHER RECOMMENDATIONS 03/02/2020 Recommended Consults -- Oral Care Recommendations Oral care QID Other Recommendations --   CHL IP FOLLOW UP RECOMMENDATIONS 03/02/2020 Follow up Recommendations (No Data)   CHL IP FREQUENCY AND DURATION 03/02/2020 Speech Therapy Frequency (ACUTE ONLY) min 1 x/week Treatment Duration 1 week      CHL IP ORAL PHASE 03/02/2020 Oral Phase Impaired Oral - Pudding Teaspoon -- Oral - Pudding Cup -- Oral - Honey Teaspoon -- Oral - Honey Cup -- Oral - Nectar Teaspoon -- Oral - Nectar Cup WFL Oral - Nectar Straw WFL Oral - Thin Teaspoon WFL Oral - Thin Cup WFL Oral - Thin Straw WFL Oral - Puree Lingual pumping Oral - Mech  Soft Lingual pumping Oral - Regular -- Oral - Multi-Consistency -- Oral - Pill -- Oral Phase - Comment pt propels boluses from pharynx/pharyngeal tongue base into oral cavity and re-swallows  CHL IP PHARYNGEAL PHASE 03/02/2020 Pharyngeal Phase Impaired Pharyngeal- Pudding Teaspoon -- Pharyngeal -- Pharyngeal- Pudding Cup -- Pharyngeal -- Pharyngeal- Honey Teaspoon -- Pharyngeal -- Pharyngeal- Honey Cup -- Pharyngeal -- Pharyngeal- Nectar Teaspoon Reduced tongue base retraction;Reduced epiglottic inversion;Reduced laryngeal elevation Pharyngeal Material does not enter airway Pharyngeal- Nectar Cup -- Pharyngeal -- Pharyngeal- Nectar Straw Reduced tongue base retraction;Penetration/Aspiration during swallow;Reduced epiglottic inversion;Reduced laryngeal  elevation Pharyngeal Material enters airway, remains ABOVE vocal cords and not ejected out Pharyngeal- Thin Teaspoon Reduced tongue base retraction;Reduced epiglottic inversion Pharyngeal Material enters airway, remains ABOVE vocal cords and not ejected out Pharyngeal- Thin Cup Reduced epiglottic inversion;Reduced laryngeal elevation;Reduced airway/laryngeal closure;Reduced tongue base retraction;Penetration/Aspiration during swallow;Penetration/Apiration after swallow Pharyngeal Material enters airway, CONTACTS cords and not ejected out Pharyngeal- Thin Straw Reduced epiglottic inversion;Reduced laryngeal elevation Pharyngeal Material enters airway, passes BELOW cords without attempt by patient to eject out (silent aspiration) Pharyngeal- Puree Reduced epiglottic inversion;Reduced tongue base retraction;Pharyngeal residue - valleculae;Reduced laryngeal elevation Pharyngeal Material does not enter airway Pharyngeal- Mechanical Soft Reduced epiglottic inversion;Reduced laryngeal elevation;Reduced tongue base retraction;Pharyngeal residue - valleculae Pharyngeal Material does not enter airway Pharyngeal- Regular -- Pharyngeal -- Pharyngeal- Multi-consistency -- Pharyngeal  -- Pharyngeal- Pill WFL Pharyngeal Material does not enter airway Pharyngeal Comment Inconsistent strength of swallow - pharyngeal clearance - Cues for effortful swallow did not contribute to prevention of retention (approx 30% retained of pudding bolus alone) but tablet with puree (whole) easily cleared (100%).  Pt does not sense retention in pharynx however he will propel vallecular retention into oral cavity and re-swallow *without awareness. Chin down posture not helpful and SlP did not head turn due to appearance of approx one inch of IJ line out of neck. SLP phoned RN regarding concern - covered bandage with transpore and proceeded with testing as advised per RN. Liquid was appears helpful to decrease retention.  CHL IP CERVICAL ESOPHAGEAL PHASE 03/02/2020 Cervical Esophageal Phase WFL Pudding Teaspoon -- Pudding Cup -- Honey Teaspoon -- Honey Cup -- Nectar Teaspoon -- Nectar Cup -- Nectar Straw -- Thin Teaspoon -- Thin Cup -- Thin Straw -- Puree -- Mechanical Soft -- Regular -- Multi-consistency -- Pill -- Cervical Esophageal Comment -- Kathleen Lime, MS [facility] SLP Acute Rehab Services Office [phone] Pager [phone] Macario Golds 03/02/2020, 4:12 PM              [place] EKG SITE RITE  Result Date: 03/01/2020 If Site Rite image not attached, placement could not be confirmed due to current cardiac rhythm.     Flora Lipps, MD  [facility] 03/03/2020  If 7PM-7AM, please contact night-coverage

## 2020-03-03 NOTE — Evaluation (Signed)
Occupational Therapy Evaluation Patient Details Name: Colton Guzman MRN: [id number] DOB: Feb 24, 1960 Today's Date: 03/03/2020    History of Present Illness 60 year old man with hypertension, diabetes admitted 1/3 with painless hematochezia / apparent lower GI bleeding, leukocytosis. Hgb 13.2 >> 11.6 with IVF. CT abdomen with angiography 1/3 did not identify active bleeding source. Has had several more episodes of BRBPR since arrival and admission.  On 1/4 he had an episode of flatus and then belching with chest discomfort.  He was treated with NTG.  Then developed VT arrest with 38min CPR with ROSC.  Labs consistent with evolving hyperglycemia and metabolic acidosis.  Intubated 1/4-1/11/22.   Clinical Impression   Patient lives with spouse in two story house with level entry. Patient reports family has brought a bed downstairs in spare room, and has half bath on main level. 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 Currently patient with decreased strength, balance, activity tolerance requiring set up for UB ADL, mod A for LB ADL and mod A for functional transfers with rolling walker. Patient is highly motivated and feel he would progress quickly with continued therapy. Acute OT will continue to maximize patient safety and independence with self care in order to facilitate D/C to venue listed below.    Follow Up Recommendations  CIR    Equipment Recommendations  Tub/shower seat    Recommendations for Other Services Rehab consult     Precautions / Restrictions Precautions Precautions: Fall Restrictions Weight Bearing Restrictions: No      Mobility Bed Mobility Overal bed mobility: Needs Assistance Bed Mobility: Rolling;Sidelying to Sit Rolling: Supervision Sidelying to sit: Min assist       General bed mobility comments: min A to complete trunk mobility    Transfers Overall transfer level: Needs assistance Equipment used: None;Rolling walker (2  wheeled) Transfers: Sit to/from [facility] Sit to Stand: Min assist;Mod assist Stand pivot transfers: Mod assist       General transfer comment: initial sit to stand without device requiring mod A and decreased stability, with RW patient min A to power up to standing with cues for hand placement and mod A due to posterior lean/LE weakness to complete transfer to recliner    Balance Overall balance assessment: Needs assistance Sitting-balance support: Feet supported Sitting balance-Leahy Scale: Good     Standing balance support: Bilateral upper extremity supported Standing balance-Leahy Scale: Poor Standing balance comment: reliant on external support                           ADL either performed or assessed with clinical judgement   ADL Overall ADL's : Needs assistance/impaired Eating/Feeding: Independent;Sitting   Grooming: Set up;Sitting   Upper Body Bathing: Supervision/ safety;Set up;Sitting   Lower Body Bathing: Moderate assistance;Sitting/lateral leans;Sit to/from stand   Upper Body Dressing : Set up;Sitting   Lower Body Dressing: Moderate assistance;Sitting/lateral leans;Sit to/from stand Lower Body Dressing Details (indicate cue type and reason): poor standing balance Toilet Transfer: Moderate assistance;Cueing for safety;RW;BSC;Stand-pivot Toilet Transfer Details (indicate cue type and reason): patient able to take few steps from EOB to recliner, initial posterior lean with mod A for safety, cues for hand placement with poor eccentric control into chair Toileting- Clothing Manipulation and Hygiene: Moderate assistance;Sit to/from stand       Functional mobility during ADLs: Moderate assistance;Cueing for safety;Cueing for sequencing;Rolling walker General ADL Comments: patient requiring increased assistance for self care due to decreased  strength, activity tolerance, balance, safety                  Pertinent Vitals/Pain Pain  Assessment: No/denies pain     Hand Dominance Right   Extremity/Trunk Assessment Upper Extremity Assessment Upper Extremity Assessment: Generalized weakness   Lower Extremity Assessment Lower Extremity Assessment: Defer to PT evaluation       Communication Communication Communication: No difficulties   Cognition Arousal/Alertness: Awake/alert Behavior During Therapy: WFL for tasks assessed/performed Overall Cognitive Status: Within Functional Limits for tasks assessed                                     General Comments  patient reports dizziness with initial sitting upright however VSS throughout activity            Home Living Family/patient expects to be discharged to:: Private residence Living Arrangements: Spouse/significant other Available Help at Discharge: Family Type of Home: House Home Access: Level entry     Home Layout: Two level;Able to live on main level with bedroom/bathroom;1/2 bath on main level     Bathroom Shower/Tub: Walk-in shower         Home Equipment: Gilford Rile - 2 wheels   Additional Comments: walker is from mother in law      Prior Functioning/Environment Level of Independence: Independent        Comments: works as Wellsite geologist Problem List: Decreased strength;Decreased activity tolerance;Impaired balance (sitting and/or standing);Decreased safety awareness      OT Treatment/Interventions: Self-care/ADL training;Therapeutic exercise;DME and/or AE instruction;Therapeutic activities;Patient/family education;Balance training    OT Goals(Current goals can be found in the care plan section) Acute Rehab OT Goals Patient Stated Goal: to move OT Goal Formulation: With patient Time For Goal Achievement: 03/17/20 Potential to Achieve Goals: Good  OT Frequency: Min 2X/week    AM-PAC OT "6 Clicks" Daily Activity     Outcome Measure Help from another person eating meals?: None Help from another person  taking care of personal grooming?: A Little Help from another person toileting, which includes using toliet, bedpan, or urinal?: A Lot Help from another person bathing (including washing, rinsing, drying)?: A Lot Help from another person to put on and taking off regular upper body clothing?: A Little Help from another person to put on and taking off regular lower body clothing?: A Lot 6 Click Score: 16   End of Session Equipment Utilized During Treatment: Rolling walker Nurse Communication: Mobility status  Activity Tolerance: Patient tolerated treatment well Patient left: in chair;with call bell/phone within reach;with chair alarm set  OT Visit Diagnosis: Unsteadiness on feet (R26.81);Muscle weakness (generalized) (M62.81)                Time: 7619-5093 OT Time Calculation (min): 24 min Charges:  OT General Charges $OT Visit: 1 Visit OT Evaluation $OT Eval Moderate Complexity: 1 Mod OT Treatments $Self Care/Home Management : 8-22 mins  Delbert Phenix OT OT pager: [phone]  Rosemary Holms 03/03/2020, 10:25 AM

## 2020-03-03 NOTE — Evaluation (Signed)
Physical Therapy Evaluation Patient Details Name: Colton Guzman MRN: [id number] DOB: Oct 26, 1960 Today's Date: 03/03/2020   History of Present Illness  60 year old man with hypertension, diabetes admitted 1/3 with painless hematochezia / apparent lower GI bleeding, leukocytosis. Hgb 13.2 >> 11.6 with IVF. CT abdomen with angiography 1/3 did not identify active bleeding source. Has had several more episodes of BRBPR since arrival and admission.  On 1/4 he had an episode of flatus and then belching with chest discomfort.  He was treated with NTG.  Then developed VT arrest with 65min CPR with ROSC.  Labs consistent with evolving hyperglycemia and metabolic acidosis.  Intubated 1/4-1/11/22.  Clinical Impression  Pt admitted with above diagnosis. Pt ambulated 140' with RW, mod assist for loss of balance x 1 during a 180* turn, otherwise steady. SaO2 95% on room air walking, HR 80s, QR interval 533.  Pt currently with functional limitations due to the deficits listed below (see PT Problem List). Pt will benefit from skilled PT to increase their independence and safety with mobility to allow discharge to the venue listed below.       Follow Up Recommendations Outpatient PT;Other (comment) (cardiac rehab)    Equipment Recommendations  Rolling walker with 5" wheels    Recommendations for Other Services       Precautions / Restrictions Precautions Precautions: Fall Restrictions Weight Bearing Restrictions: No      Mobility  Bed Mobility Overal bed mobility: Needs Assistance Bed Mobility: Rolling;Sidelying to Sit Rolling: Supervision Sidelying to sit: Supervision       General bed mobility comments: used rail    Transfers Overall transfer level: Needs assistance Equipment used: Rolling walker (2 wheeled) Transfers: Sit to/from Stand Sit to Stand: Min guard Stand pivot transfers: Mod assist       General transfer comment: min/guard for safety  Ambulation/Gait Ambulation/Gait  assistance: Min guard;Mod assist Gait Distance (Feet): 140 Feet Assistive device: Rolling walker (2 wheeled) Gait Pattern/deviations: Step-through pattern;Decreased stride length Gait velocity: decr   General Gait Details: LOB x 1 requiring mod A to prevent fall, occurred while turning 180* with RW, SaO2 95% on room air walking, HR 80s  Stairs            Wheelchair Mobility    Modified Rankin (Stroke Patients Only)       Balance Overall balance assessment: Needs assistance Sitting-balance support: Feet supported Sitting balance-Leahy Scale: Good     Standing balance support: Bilateral upper extremity supported Standing balance-Leahy Scale: Poor Standing balance comment: reliant on external support                             Pertinent Vitals/Pain Pain Assessment: No/denies pain    Home Living Family/patient expects to be discharged to:: Private residence Living Arrangements: Spouse/significant other Available Help at Discharge: Family Type of Home: House Home Access: Level entry     Home Layout: Two level;Able to live on main level with bedroom/bathroom;1/2 bath on main level Home Equipment: Walker - 2 wheels Additional Comments: walker is from mother in law    Prior Function Level of Independence: Independent         Comments: works as Science writer   Dominant Hand: Right    Extremity/Trunk Assessment   Upper Extremity Assessment Upper Extremity Assessment: Defer to OT evaluation    Lower Extremity Assessment Lower Extremity Assessment: Overall WFL for tasks assessed    Cervical /  Trunk Assessment Cervical / Trunk Assessment: Normal  Communication   Communication: No difficulties  Cognition Arousal/Alertness: Awake/alert Behavior During Therapy: WFL for tasks assessed/performed Overall Cognitive Status: Within Functional Limits for tasks assessed                                         General Comments General comments (skin integrity, edema, etc.): patient reports dizziness with initial sitting upright however VSS throughout activity    Exercises     Assessment/Plan    PT Assessment    PT Problem List         PT Treatment Interventions      PT Goals (Current goals can be found in the Care Plan section)  Acute Rehab PT Goals Patient Stated Goal: to walk a mile in the neighborhood PT Goal Formulation: With patient Time For Goal Achievement: 03/17/20 Potential to Achieve Goals: Good    Frequency Min 3X/week   Barriers to discharge        Co-evaluation               AM-PAC PT "6 Clicks" Mobility  Outcome Measure Help needed turning from your back to your side while in a flat bed without using bedrails?: None Help needed moving from lying on your back to sitting on the side of a flat bed without using bedrails?: A Little Help needed moving to and from a bed to a chair (including a wheelchair)?: A Little Help needed standing up from a chair using your arms (e.g., wheelchair or bedside chair)?: None Help needed to walk in hospital room?: A Little Help needed climbing 3-5 steps with a railing? : A Little 6 Click Score: 20    End of Session Equipment Utilized During Treatment: Gait belt Activity Tolerance: Patient tolerated treatment well Patient left: in chair;with call bell/phone within reach;with family/visitor present Nurse Communication: Mobility status PT Visit Diagnosis: Unsteadiness on feet (R26.81);Difficulty in walking, not elsewhere classified (R26.2)    Time: 4010-2725 PT Time Calculation (min) (ACUTE ONLY): 16 min   Charges:   PT Evaluation $PT Eval Low Complexity: 1 Low          Blondell Reveal Kistler PT 03/03/2020  Acute Rehabilitation Services Pager [phone] Office [phone]

## 2020-03-03 NOTE — Progress Notes (Signed)
Rehab Admissions Coordinator Note:  Per OT recommendation, this patient was screened by Raechel Ache for appropriateness for an Inpatient Acute Rehab Consult.  At this time, we are recommending Inpatient Rehab consult. AC will place consult order per protocol.   Raechel Ache 03/03/2020, 11:39 AM  I can be reached at [phone].

## 2020-03-03 NOTE — Progress Notes (Signed)
ekgs and case reviewed with Drs Percival Spanish and Pokhrel today.  The patient presented with GI bleed.  QT was normal at that time.  He has developed marked qt prolongation while hospitalized with torsades observed.   Most likely, he has developed iatrogenic QT prolongation.  I have advised that medicines be reviewed with pharmacy to exclude any QT prolonging medicine.  Keppra and Zofran are discontinued at this time. Hopefully QT will improve.  Keep K >3.9 and Mg>1.9 Increase metoprolol to 50mg  BID as tolerated  Plans for cath tomorrow are noted. Ep can follow-up with the patient is at [facility] for cath with any additional recommendations.  Thompson Grayer MD, [place] 03/03/2020

## 2020-03-03 NOTE — Progress Notes (Addendum)
Progress Note  Patient Name: Colton Guzman Date of Encounter: 03/03/2020  [facility] Cardiologist: Freada Bergeron, MD   Subjective   No acute overnight events. Patient is now off nasal cannula and O2 sats are in the high 90's on room air. No chest pain or shortness of breath. Frequent runs of accelerated idioventricular rhythm noted overnight on telemetry. Longest run was about 1 minute around 5:40am this morning. Wife is present at bedside this morning and is very eager for patient to have cardiac cath.  Inpatient Medications    Scheduled Meds: . aspirin  81 mg Oral Daily  . Chlorhexidine Gluconate Cloth  6 each Topical Q0600  . docusate  100 mg Per Tube BID  . furosemide  40 mg Intravenous Daily  . insulin aspart  0-9 Units Subcutaneous Q4H  . insulin glargine  10 Units Subcutaneous BID  . metoprolol tartrate  25 mg Oral BID  . pantoprazole  40 mg Oral Q1200  . polyethylene glycol  17 g Per Tube Daily  . potassium chloride  40 mEq Oral BID  . sodium chloride flush  10-40 mL Intracatheter Q12H   Continuous Infusions: . sodium chloride 10 mL/hr at 03/02/20 1127  . levETIRAcetam 500 mg (03/02/20 1906)   PRN Meds: sodium chloride, alum & mag hydroxide-simeth, nitroGLYCERIN, ondansetron **OR** ondansetron (ZOFRAN) IV, phenol, sodium chloride flush   Vital Signs    Vitals:   03/02/20 1648 03/02/20 2038 03/03/20 0000 03/03/20 0400  BP: (!) 167/86   (!) 167/86  Pulse: 92   73  Resp: 19   (!) 22  Temp: 98.2 F (36.8 C) 98.3 F (36.8 C) 98.3 F (36.8 C) 98.2 F (36.8 C)  TempSrc: Oral Oral Oral Oral  SpO2: 98%   96%  Weight:      Height:        Intake/Output Summary (Last 24 hours) at 03/03/2020 0743 Last data filed at 03/03/2020 0400 Gross per 24 hour  Intake 373.77 ml  Output 3150 ml  Net -2776.23 ml   Last 3 Weights 03/02/2020 03/01/2020 02/29/2020  Weight (lbs) 189 lb 13.1 oz 224 lb 13.9 oz 211 lb 10.3 oz  Weight (kg) 86.1 kg 102 kg 96 kg       Telemetry    Normal sinus rhytm with multiple runs of accelerated idioventricular rhythm overnight - these runs seemed to last longer and longer until patient had about a 1 minute run around 5:40am. Since then no significant runs. Rates in the 70's to 80's. - Personally Reviewed  ECG    No new ECG tracings today.  - Personally Reviewed  Physical Exam   GEN: No acute distress.   Neck: No JVD Cardiac: RRR. II/VI systolic murmur. No rubs or gallops.  Respiratory: No increased work of breathing. Decreased breath sounds in bases but no significant crackles, wheezes, or rales.  GI: Soft, non-tender, non-distended  MS: Minimal lower extremity edema. No deformity. Skin: Warm and dry. Neuro:  No focal deficits. Psych: Normal affect. Responds appropriately.  Labs    High Sensitivity Troponin:   Recent Labs  Lab 02/24/20 0432 02/24/20 1124 02/24/20 1803 02/24/20 2000 03/01/20 0624  TROPONINIHS 12,216* 15,553* 12,157* 9,517* 942*      Chemistry Recent Labs  Lab 02/27/20 0145 02/28/20 0512 03/01/20 0624 03/01/20 2006 03/02/20 0446 03/03/20 0610  NA 134*   < > 138 142 142 139  K 3.2*   < > 3.3* 3.1* 3.3* 3.2*  CL 103   < >  108 102 102 100  CO2 17*   < > 21* 27 27 29   GLUCOSE 199*   < > 276* 143* 158* 168*  BUN 19   < > 13 8 8 6   CREATININE 0.72   < > 0.84 0.72 0.78 0.80  CALCIUM 7.4*   < > 7.5* 7.4* 7.7* 8.2*  PROT 5.4*  --  5.8*  --   --  6.6  ALBUMIN 2.5*  --  2.3*  --   --  2.6*  AST 320*  --  46*  --   --  39  ALT 536*  --  167*  --   --  111*  ALKPHOS 57  --  75  --   --  77  BILITOT 2.1*  --  1.1  --   --  1.2  GFRNONAA >60   < > >60 >60 >60 >60  ANIONGAP 14   < > 9 13 13 10    < > = values in this interval not displayed.     Hematology Recent Labs  Lab 02/29/20 0230 02/29/20 1100 02/29/20 1747 03/01/20 0624  WBC 11.7*  --  11.0* 11.6*  RBC 3.23*  --  3.12* 3.34*  HGB 9.8* 9.2* 9.3* 10.0*  HCT 29.8* 27.8* 28.9* 30.7*  MCV 92.3  --  92.6 91.9  MCH  30.3  --  29.8 29.9  MCHC 32.9  --  32.2 32.6  RDW 17.2*  --  17.9* 18.1*  PLT 126*  --  135* 167    BNPNo results for input(s): BNP, PROBNP in the last 168 hours.   DDimer No results for input(s): DDIMER in the last 168 hours.   Radiology    DG Swallowing Func-Speech Pathology  Result Date: 03/02/2020 Objective Swallowing Evaluation: Type of Study: Bedside Swallow Evaluation  Patient Details Name: Colton Guzman MRN: [id number] Date of Birth: 06-06-1960 Today's Date: 03/02/2020 Time: SLP Start Time (ACUTE ONLY): 1455 -SLP Stop Time (ACUTE ONLY): 0174 SLP Time Calculation (min) (ACUTE ONLY): 40 min Past Medical History: Past Medical History: Diagnosis Date . Diabetes mellitus without complication ([place])  . Hypertension  Past Surgical History: Past Surgical History: Procedure Laterality Date . COLONOSCOPY WITH PROPOFOL Left 02/27/2020  Procedure: COLONOSCOPY WITH PROPOFOL;  Surgeon: Otis Brace, MD;  Location: [facility];  Service: Gastroenterology;  Laterality: Left; . ESOPHAGOGASTRODUODENOSCOPY (EGD) WITH PROPOFOL N/A 02/27/2020  Procedure: ESOPHAGOGASTRODUODENOSCOPY (EGD) WITH PROPOFOL;  Surgeon: Otis Brace, MD;  Location: [facility];  Service: Gastroenterology;  Laterality: N/A; HPI: pt is a 60 yo male adm with GI bleed.  Pt underwent endoscopy and after had AMS, ? seizure activity with cardiac arrest requiring cardioversion and CPR.  Intubated 1/5 to 03/01/2020. Swallow eval ordered.  Pt also has h/o DM.  His CXR showed multifocal pna vs edema.  He is currently on room air.  Pt admits to some issues with swallowing prior to admit but does not expand on information.  Subjective: pt awake in chair Assessment / Plan / Recommendation CHL IP CLINICAL IMPRESSIONS 03/02/2020 Clinical Impression Min oral and mild pharyngeal dysphagia present likely due to potential edema from pt's prolonged intubation.  He demonstrates retention of secretions in pharynx without sensation.  Mildly impaired tongue base  retraction, laryngeal elevation/closure allows laryngeal penetration of liquids and vallecular retention.  Pt denies sensation to retention however will frequently propel vallecular residual into oral cavity and reswallow.  No aspiration but pt demonstrates laryngeal penetration to cords with thin.  Cued cough/throat clearing removed trace  penetration.  Pharyngeal retention worse with solid than liquids - thus recommend start with full liquid diet with strict precautions.  SlP will follow up next date for tolerance and readinesss for dietary advancement.  Using teach back with live feed, pt educated to recommendations and precautions.  Of note, pt did NOT cough during the MBS, but overtly coughed and expectorated viscous secretions *minimal blood tinged* after swallowing water.  Anticipate acute dysphagia to improve with decreased pharyngeal edema and conditioning. SLP Visit Diagnosis Dysphagia, pharyngeal phase (R13.13);Dysphagia, pharyngoesophageal phase (R13.14) Attention and concentration deficit following -- Frontal lobe and executive function deficit following -- Impact on safety and function Mild aspiration risk   CHL IP TREATMENT RECOMMENDATION 03/02/2020 Treatment Recommendations Therapy as outlined in treatment plan below   Prognosis 03/02/2020 Prognosis for Safe Diet Advancement Fair Barriers to Reach Goals -- Barriers/Prognosis Comment -- CHL IP DIET RECOMMENDATION 03/02/2020 SLP Diet Recommendations Thin liquid Liquid Administration via -- Medication Administration Whole meds with puree Compensations Slow rate;Small sips/bites;Other (Comment) Postural Changes --   CHL IP OTHER RECOMMENDATIONS 03/02/2020 Recommended Consults -- Oral Care Recommendations Oral care QID Other Recommendations --   CHL IP FOLLOW UP RECOMMENDATIONS 03/02/2020 Follow up Recommendations (No Data)   CHL IP FREQUENCY AND DURATION 03/02/2020 Speech Therapy Frequency (ACUTE ONLY) min 1 x/week Treatment Duration 1 week      CHL IP ORAL  PHASE 03/02/2020 Oral Phase Impaired Oral - Pudding Teaspoon -- Oral - Pudding Cup -- Oral - Honey Teaspoon -- Oral - Honey Cup -- Oral - Nectar Teaspoon -- Oral - Nectar Cup WFL Oral - Nectar Straw WFL Oral - Thin Teaspoon WFL Oral - Thin Cup WFL Oral - Thin Straw WFL Oral - Puree Lingual pumping Oral - Mech Soft Lingual pumping Oral - Regular -- Oral - Multi-Consistency -- Oral - Pill -- Oral Phase - Comment pt propels boluses from pharynx/pharyngeal tongue base into oral cavity and re-swallows  CHL IP PHARYNGEAL PHASE 03/02/2020 Pharyngeal Phase Impaired Pharyngeal- Pudding Teaspoon -- Pharyngeal -- Pharyngeal- Pudding Cup -- Pharyngeal -- Pharyngeal- Honey Teaspoon -- Pharyngeal -- Pharyngeal- Honey Cup -- Pharyngeal -- Pharyngeal- Nectar Teaspoon Reduced tongue base retraction;Reduced epiglottic inversion;Reduced laryngeal elevation Pharyngeal Material does not enter airway Pharyngeal- Nectar Cup -- Pharyngeal -- Pharyngeal- Nectar Straw Reduced tongue base retraction;Penetration/Aspiration during swallow;Reduced epiglottic inversion;Reduced laryngeal elevation Pharyngeal Material enters airway, remains ABOVE vocal cords and not ejected out Pharyngeal- Thin Teaspoon Reduced tongue base retraction;Reduced epiglottic inversion Pharyngeal Material enters airway, remains ABOVE vocal cords and not ejected out Pharyngeal- Thin Cup Reduced epiglottic inversion;Reduced laryngeal elevation;Reduced airway/laryngeal closure;Reduced tongue base retraction;Penetration/Aspiration during swallow;Penetration/Apiration after swallow Pharyngeal Material enters airway, CONTACTS cords and not ejected out Pharyngeal- Thin Straw Reduced epiglottic inversion;Reduced laryngeal elevation Pharyngeal Material enters airway, passes BELOW cords without attempt by patient to eject out (silent aspiration) Pharyngeal- Puree Reduced epiglottic inversion;Reduced tongue base retraction;Pharyngeal residue - valleculae;Reduced laryngeal elevation  Pharyngeal Material does not enter airway Pharyngeal- Mechanical Soft Reduced epiglottic inversion;Reduced laryngeal elevation;Reduced tongue base retraction;Pharyngeal residue - valleculae Pharyngeal Material does not enter airway Pharyngeal- Regular -- Pharyngeal -- Pharyngeal- Multi-consistency -- Pharyngeal -- Pharyngeal- Pill WFL Pharyngeal Material does not enter airway Pharyngeal Comment Inconsistent strength of swallow - pharyngeal clearance - Cues for effortful swallow did not contribute to prevention of retention (approx 30% retained of pudding bolus alone) but tablet with puree (whole) easily cleared (100%).  Pt does not sense retention in pharynx however he will propel vallecular retention into oral cavity and re-swallow *without awareness.  Chin down posture not helpful and SlP did not head turn due to appearance of approx one inch of IJ line out of neck. SLP phoned RN regarding concern - covered bandage with transpore and proceeded with testing as advised per RN. Liquid was appears helpful to decrease retention.  CHL IP CERVICAL ESOPHAGEAL PHASE 03/02/2020 Cervical Esophageal Phase WFL Pudding Teaspoon -- Pudding Cup -- Honey Teaspoon -- Honey Cup -- Nectar Teaspoon -- Nectar Cup -- Nectar Straw -- Thin Teaspoon -- Thin Cup -- Thin Straw -- Puree -- Mechanical Soft -- Regular -- Multi-consistency -- Pill -- Cervical Esophageal Comment -- Kathleen Lime, MS [facility] SLP Acute Rehab Services Office [phone] Pager [phone] Macario Golds 03/02/2020, 4:12 PM              [place] EKG SITE RITE  Result Date: 03/01/2020 If Site Rite image not attached, placement could not be confirmed due to current cardiac rhythm.   Cardiac Studies   Echocardiogram 02/25/2020: Impressions: 1. LVEF 50-55% with wall motion abnormalities suspicious for mid LAD. No  apical thrombus.  2. Left ventricular ejection fraction, by estimation, is 50 to 55%. The  left ventricle has low normal function. The left ventricle  demonstrates  regional wall motion abnormalities (see scoring diagram/findings for  description). Left ventricular diastolic  parameters are consistent with Grade II diastolic dysfunction  (pseudonormalization). Elevated left atrial pressure.  3. Right ventricular systolic function is normal. The right ventricular  size is normal. There is moderately elevated pulmonary artery systolic  pressure. The estimated right ventricular systolic pressure is 52.8 mmHg.  4. Left atrial size was mildly dilated.  5. The mitral valve is normal in structure. Moderate mitral valve  regurgitation. No evidence of mitral stenosis.  6. The aortic valve is normal in structure. Aortic valve regurgitation is  not visualized. Mild to moderate aortic valve sclerosis/calcification is  present, without any evidence of aortic stenosis.  7. The inferior vena cava is normal in size with greater than 50%  respiratory variability, suggesting right atrial pressure of 3 mmHg.   Patient Profile   Colton Guzman is a 60 y.o. male with a history of hypertension and type 2 diabetes but no known cardiac history who was admitted on 02/22/2020 for further evaluation of hematochezia. On 02/23/2020, patient had a chest pain episode with blood bowel movement. Chest pain resolved with Nitro. However, he then had recurrent chest pain and actually had a VT arrest. Received 2-5 minutes of CPR and was cardioverted. Post-resuscitation rhythm was trial fibrillation. Cardiology was consulted for further evaluation/management of VF arrest and atrial fibrillation.  Assessment & Plan    VT Arrest NSTEMI - Patient had VT arrest on 1/4 in setting of ongoing GI bleeding. ROSC obtained after about 2-5 minutes of CPR and cardioversion.  - High-sensitivity troponin peaked at >15,000 on 1/5 and has trended down. - Most recent EKG on 1/11 shows normal sinus rhythm with possible Q waves in V1-V3 and ST depression in V4-V6 as well as lead II. - Echo  showed LVEF of 50-55% with hypokinesis of mid and distal anterior wall, mid and distal anterior septum, and apex as well as grade 2 diastolic dysfunction. Moderate MR and moderately elevated PASP of 48.7 mmHg also noted. - IV Heparin was started on 1/6 without bolus after clearance with GI. However, he had recurrent bleeding midnight on 1/8. Therefore, Heparin was stopped. - Patient denies any chest pain.  - Continue Aspirin.  - Continue Lopressor 25mg  twice daily. -  Home statin on hold due to elevated LFTs. Can restart once these normalize. - Will ultimately need cardiac catheterization prior to discharge. Hopefully tomorrow. Will discuss with MD. We may need to make sure GI is OK with this given the last time we tried to start Heparin he had recurrent GI bleed.  Possible Cardiogenic Shock - COOX in the 50's on 1/5 consistent with cardiogenic shock but was 97 on last check on 1/6.  - Patient was started on pressors after VT arrest but these have all been weaned off. Has not been on any pressors since 1/8. - Patient now being diuresed with IV Lasix with good  urinary response. Documented urinary output of 3.15 L yesterday but net positive 2.6 L this admission. No documented weight for today but was down 5lbs from admission yesterday. Renal function stable.  - Continue IV Lasix for now. May be able to transition to PO soon. - Continue to monitor daily weight, strict I/O's, and renal function.  Acute Hypoxic Respiratory Failure Post Arrest Requiring Intubation Suspect Aspiration due to Cardiac Arrest Acute Pulmonary Edema - Extubated on 1/11. O2 sats now in the high 90's on room air. - Diuresis as above.  - Continue antibiotics per primary team. - Management per primary team.  Atrial Fibrillation with RVR - Post arrest rhythm with atrial fibrillation with RVR. However, he then converted to sinus rhythm.  - Maintaining sinus rhythm with multiple runs of AIVR overnight.  - Continue Lopressor  as above. - Not an anticoagulation candidate with ongoing GI bleed.  Ventricular Escape  Prolonged QTc - Noted to have ventricular escape overnight on 1/10-1/11. EKG that night showed prolonged QTc of 529 ms.  - Resolved with decrease in Precedex.  - Most recent EKG on 1/11 shows improvement in QTc to 477 ms. - Patient had multiple runs of accelerated idioventricular rhythm overnight - these runs seemed to last longer and longer until patient had about a 1 minute run around 5:40am. Since then no significant runs. Rates in the 70's to 80's.  - Will recheck EKG today.  Hypertension - BP elevated. Systolic BP in the 893'Y to 160's yesterday.  - Continue Lopressor 25mg  twice daily. - Home Losartan-HCTZ and Amlodipine currently on hold. Consider restarting home Amlodipine.  Hyperlipidemia - Home Lipitor on hold for now given elevated LFTs. - Can restart when LFTs normalize (should be soon).  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 Felt to be from diverticular bleed. - EGD on 1/8 negative for bleeding or ulcer.  - Hemoglobin as low as 7.2 this admission. S/p a total of 7 units of PRBCs (last received 4 units on 1/8) Stable at 10.0 yesterday.  - No recurrent bleeding. - Management per primary team and GI.  Hypokalemia - Potassium 3.2 this morning. Being repleted by primary team. - Will recheck Magnesium. - Continue to monitor closely with diuresis.  Otherwise, per primary team: - Possible seizure like activity - Fever - Diabetes Mellitus with DKA/HHS  For questions or updates, please contact [place] Please consult [email or web address] for contact info under        Signed, Darreld Mclean, PA-C  03/03/2020, 7:43 AM     History and all data above reviewed.  No chest pain.  No SOB.  Patient examined.  I agree with the findings as above.  The patient exam reveals  COR:RRR  ,  Lungs: Clear  ,  Abd: Positive bowel sounds, no rebound no guarding, Ext No edema  .  All available labs, radiology testing, previous records reviewed. Agree with documented assessment and plan.   Vfib arrest:  Plan cardiac cath tomorrow given this and the increased troponin and the mildly reduced EF.  The patient understands that risks included but are not limited to stroke (1 in 1000), death (1 in 24), kidney failure [usually temporary] (1 in 500), bleeding (1 in 200), allergic reaction [possibly serious] (1 in 200).  The patient understands and agrees to proceed.     QT prolonged:  This was not evident on admission.  He has had mag supplemented.  Currently not on traditional drugs associated with QT prolongation although might be associated and I have asked if we can stop this.  We will continue to supplement the potassium.  I have asked Dr. Rayann Heman with EP to review.  He is having idioventricular rhythm on telemetry.     Jeneen Rinks Destyne Goodreau  2:17 PM  03/03/2020

## 2020-03-03 NOTE — Progress Notes (Signed)
Inpatient Rehabilitation Admissions Coordinator  Inpatient rehab consult received. I contacted patient's wife by phone as she is at his bedside to discuss rehab venue options. Wife was a former Glass blower/designer of [employer]. She feels patient is progressing well and she is hopeful that he can discharge directly home after the cardiac cath and over the next couple of days.I will follow his progress.  Danne Baxter, RN, MSN Rehab Admissions Coordinator [phone] 03/03/2020 1:25 PM

## 2020-03-03 NOTE — TOC Progression Note (Signed)
Transition of Care [facility]) - Progression Note    Patient Details  Name: Colton Guzman MRN: [id number] Date of Birth: [birth year]  [facility] CM/SW Contact  Rayen Dafoe, Juliann Pulse, RN Phone Number: 03/03/2020, 1:10 PM  Clinical Narrative: CIR following-await recc.      Expected Discharge Plan: IP Rehab Facility Barriers to Discharge: Continued Medical Work up  Expected Discharge Plan and Services Expected Discharge Plan: [place]   Discharge Planning Services: CM Consult   Living arrangements for the past 2 months: Single Family Home                                       Social Determinants of Health (SDOH) Interventions    Readmission Risk Interventions No flowsheet data found.

## 2020-03-03 NOTE — H&P (View-Only) (Signed)
Progress Note  Patient Name: Colton Guzman Date of Encounter: 03/03/2020  [facility] Cardiologist: Freada Bergeron, MD   Subjective   No acute overnight events. Patient is now off nasal cannula and O2 sats are in the high 90's on room air. No chest pain or shortness of breath. Frequent runs of accelerated idioventricular rhythm noted overnight on telemetry. Longest run was about 1 minute around 5:40am this morning. Wife is present at bedside this morning and is very eager for patient to have cardiac cath.  Inpatient Medications    Scheduled Meds: . aspirin  81 mg Oral Daily  . Chlorhexidine Gluconate Cloth  6 each Topical Q0600  . docusate  100 mg Per Tube BID  . furosemide  40 mg Intravenous Daily  . insulin aspart  0-9 Units Subcutaneous Q4H  . insulin glargine  10 Units Subcutaneous BID  . metoprolol tartrate  25 mg Oral BID  . pantoprazole  40 mg Oral Q1200  . polyethylene glycol  17 g Per Tube Daily  . potassium chloride  40 mEq Oral BID  . sodium chloride flush  10-40 mL Intracatheter Q12H   Continuous Infusions: . sodium chloride 10 mL/hr at 03/02/20 1127  . levETIRAcetam 500 mg (03/02/20 1906)   PRN Meds: sodium chloride, alum & mag hydroxide-simeth, nitroGLYCERIN, ondansetron **OR** ondansetron (ZOFRAN) IV, phenol, sodium chloride flush   Vital Signs    Vitals:   03/02/20 1648 03/02/20 2038 03/03/20 0000 03/03/20 0400  BP: (!) 167/86   (!) 167/86  Pulse: 92   73  Resp: 19   (!) 22  Temp: 98.2 F (36.8 C) 98.3 F (36.8 C) 98.3 F (36.8 C) 98.2 F (36.8 C)  TempSrc: Oral Oral Oral Oral  SpO2: 98%   96%  Weight:      Height:        Intake/Output Summary (Last 24 hours) at 03/03/2020 0743 Last data filed at 03/03/2020 0400 Gross per 24 hour  Intake 373.77 ml  Output 3150 ml  Net -2776.23 ml   Last 3 Weights 03/02/2020 03/01/2020 02/29/2020  Weight (lbs) 189 lb 13.1 oz 224 lb 13.9 oz 211 lb 10.3 oz  Weight (kg) 86.1 kg 102 kg 96 kg       Telemetry    Normal sinus rhytm with multiple runs of accelerated idioventricular rhythm overnight - these runs seemed to last longer and longer until patient had about a 1 minute run around 5:40am. Since then no significant runs. Rates in the 70's to 80's. - Personally Reviewed  ECG    No new ECG tracings today.  - Personally Reviewed  Physical Exam   GEN: No acute distress.   Neck: No JVD Cardiac: RRR. II/VI systolic murmur. No rubs or gallops.  Respiratory: No increased work of breathing. Decreased breath sounds in bases but no significant crackles, wheezes, or rales.  GI: Soft, non-tender, non-distended  MS: Minimal lower extremity edema. No deformity. Skin: Warm and dry. Neuro:  No focal deficits. Psych: Normal affect. Responds appropriately.  Labs    High Sensitivity Troponin:   Recent Labs  Lab 02/24/20 0432 02/24/20 1124 02/24/20 1803 02/24/20 2000 03/01/20 0624  TROPONINIHS 12,216* 15,553* 12,157* 9,517* 942*      Chemistry Recent Labs  Lab 02/27/20 0145 02/28/20 0512 03/01/20 0624 03/01/20 2006 03/02/20 0446 03/03/20 0610  NA 134*   < > 138 142 142 139  K 3.2*   < > 3.3* 3.1* 3.3* 3.2*  CL 103   < >  108 102 102 100  CO2 17*   < > 21* 27 27 29   GLUCOSE 199*   < > 276* 143* 158* 168*  BUN 19   < > 13 8 8 6   CREATININE 0.72   < > 0.84 0.72 0.78 0.80  CALCIUM 7.4*   < > 7.5* 7.4* 7.7* 8.2*  PROT 5.4*  --  5.8*  --   --  6.6  ALBUMIN 2.5*  --  2.3*  --   --  2.6*  AST 320*  --  46*  --   --  39  ALT 536*  --  167*  --   --  111*  ALKPHOS 57  --  75  --   --  77  BILITOT 2.1*  --  1.1  --   --  1.2  GFRNONAA >60   < > >60 >60 >60 >60  ANIONGAP 14   < > 9 13 13 10    < > = values in this interval not displayed.     Hematology Recent Labs  Lab 02/29/20 0230 02/29/20 1100 02/29/20 1747 03/01/20 0624  WBC 11.7*  --  11.0* 11.6*  RBC 3.23*  --  3.12* 3.34*  HGB 9.8* 9.2* 9.3* 10.0*  HCT 29.8* 27.8* 28.9* 30.7*  MCV 92.3  --  92.6 91.9  MCH  30.3  --  29.8 29.9  MCHC 32.9  --  32.2 32.6  RDW 17.2*  --  17.9* 18.1*  PLT 126*  --  135* 167    BNPNo results for input(s): BNP, PROBNP in the last 168 hours.   DDimer No results for input(s): DDIMER in the last 168 hours.   Radiology    DG Swallowing Func-Speech Pathology  Result Date: 03/02/2020 Objective Swallowing Evaluation: Type of Study: Bedside Swallow Evaluation  Patient Details Name: [name] MRN: [id number] Date of Birth: 26-Mar-1960 Today's Date: 03/02/2020 Time: SLP Start Time (ACUTE ONLY): 1455 -SLP Stop Time (ACUTE ONLY): 5397 SLP Time Calculation (min) (ACUTE ONLY): 40 min Past Medical History: Past Medical History: Diagnosis Date . Diabetes mellitus without complication ([place])  . Hypertension  Past Surgical History: Past Surgical History: Procedure Laterality Date . COLONOSCOPY WITH PROPOFOL Left 02/27/2020  Procedure: COLONOSCOPY WITH PROPOFOL;  Surgeon: Otis Brace, MD;  Location: [facility];  Service: Gastroenterology;  Laterality: Left; . ESOPHAGOGASTRODUODENOSCOPY (EGD) WITH PROPOFOL N/A 02/27/2020  Procedure: ESOPHAGOGASTRODUODENOSCOPY (EGD) WITH PROPOFOL;  Surgeon: Otis Brace, MD;  Location: [facility];  Service: Gastroenterology;  Laterality: N/A; HPI: pt is a 60 yo male adm with GI bleed.  Pt underwent endoscopy and after had AMS, ? seizure activity with cardiac arrest requiring cardioversion and CPR.  Intubated 1/5 to 03/01/2020. Swallow eval ordered.  Pt also has h/o DM.  His CXR showed multifocal pna vs edema.  He is currently on room air.  Pt admits to some issues with swallowing prior to admit but does not expand on information.  Subjective: pt awake in chair Assessment / Plan / Recommendation CHL IP CLINICAL IMPRESSIONS 03/02/2020 Clinical Impression Min oral and mild pharyngeal dysphagia present likely due to potential edema from pt's prolonged intubation.  He demonstrates retention of secretions in pharynx without sensation.  Mildly impaired tongue base  retraction, laryngeal elevation/closure allows laryngeal penetration of liquids and vallecular retention.  Pt denies sensation to retention however will frequently propel vallecular residual into oral cavity and reswallow.  No aspiration but pt demonstrates laryngeal penetration to cords with thin.  Cued cough/throat clearing removed trace  penetration.  Pharyngeal retention worse with solid than liquids - thus recommend start with full liquid diet with strict precautions.  SlP will follow up next date for tolerance and readinesss for dietary advancement.  Using teach back with live feed, pt educated to recommendations and precautions.  Of note, pt did NOT cough during the MBS, but overtly coughed and expectorated viscous secretions *minimal blood tinged* after swallowing water.  Anticipate acute dysphagia to improve with decreased pharyngeal edema and conditioning. SLP Visit Diagnosis Dysphagia, pharyngeal phase (R13.13);Dysphagia, pharyngoesophageal phase (R13.14) Attention and concentration deficit following -- Frontal lobe and executive function deficit following -- Impact on safety and function Mild aspiration risk   CHL IP TREATMENT RECOMMENDATION 03/02/2020 Treatment Recommendations Therapy as outlined in treatment plan below   Prognosis 03/02/2020 Prognosis for Safe Diet Advancement Fair Barriers to Reach Goals -- Barriers/Prognosis Comment -- CHL IP DIET RECOMMENDATION 03/02/2020 SLP Diet Recommendations Thin liquid Liquid Administration via -- Medication Administration Whole meds with puree Compensations Slow rate;Small sips/bites;Other (Comment) Postural Changes --   CHL IP OTHER RECOMMENDATIONS 03/02/2020 Recommended Consults -- Oral Care Recommendations Oral care QID Other Recommendations --   CHL IP FOLLOW UP RECOMMENDATIONS 03/02/2020 Follow up Recommendations (No Data)   CHL IP FREQUENCY AND DURATION 03/02/2020 Speech Therapy Frequency (ACUTE ONLY) min 1 x/week Treatment Duration 1 week      CHL IP ORAL  PHASE 03/02/2020 Oral Phase Impaired Oral - Pudding Teaspoon -- Oral - Pudding Cup -- Oral - Honey Teaspoon -- Oral - Honey Cup -- Oral - Nectar Teaspoon -- Oral - Nectar Cup WFL Oral - Nectar Straw WFL Oral - Thin Teaspoon WFL Oral - Thin Cup WFL Oral - Thin Straw WFL Oral - Puree Lingual pumping Oral - Mech Soft Lingual pumping Oral - Regular -- Oral - Multi-Consistency -- Oral - Pill -- Oral Phase - Comment pt propels boluses from pharynx/pharyngeal tongue base into oral cavity and re-swallows  CHL IP PHARYNGEAL PHASE 03/02/2020 Pharyngeal Phase Impaired Pharyngeal- Pudding Teaspoon -- Pharyngeal -- Pharyngeal- Pudding Cup -- Pharyngeal -- Pharyngeal- Honey Teaspoon -- Pharyngeal -- Pharyngeal- Honey Cup -- Pharyngeal -- Pharyngeal- Nectar Teaspoon Reduced tongue base retraction;Reduced epiglottic inversion;Reduced laryngeal elevation Pharyngeal Material does not enter airway Pharyngeal- Nectar Cup -- Pharyngeal -- Pharyngeal- Nectar Straw Reduced tongue base retraction;Penetration/Aspiration during swallow;Reduced epiglottic inversion;Reduced laryngeal elevation Pharyngeal Material enters airway, remains ABOVE vocal cords and not ejected out Pharyngeal- Thin Teaspoon Reduced tongue base retraction;Reduced epiglottic inversion Pharyngeal Material enters airway, remains ABOVE vocal cords and not ejected out Pharyngeal- Thin Cup Reduced epiglottic inversion;Reduced laryngeal elevation;Reduced airway/laryngeal closure;Reduced tongue base retraction;Penetration/Aspiration during swallow;Penetration/Apiration after swallow Pharyngeal Material enters airway, CONTACTS cords and not ejected out Pharyngeal- Thin Straw Reduced epiglottic inversion;Reduced laryngeal elevation Pharyngeal Material enters airway, passes BELOW cords without attempt by patient to eject out (silent aspiration) Pharyngeal- Puree Reduced epiglottic inversion;Reduced tongue base retraction;Pharyngeal residue - valleculae;Reduced laryngeal elevation  Pharyngeal Material does not enter airway Pharyngeal- Mechanical Soft Reduced epiglottic inversion;Reduced laryngeal elevation;Reduced tongue base retraction;Pharyngeal residue - valleculae Pharyngeal Material does not enter airway Pharyngeal- Regular -- Pharyngeal -- Pharyngeal- Multi-consistency -- Pharyngeal -- Pharyngeal- Pill WFL Pharyngeal Material does not enter airway Pharyngeal Comment Inconsistent strength of swallow - pharyngeal clearance - Cues for effortful swallow did not contribute to prevention of retention (approx 30% retained of pudding bolus alone) but tablet with puree (whole) easily cleared (100%).  Pt does not sense retention in pharynx however he will propel vallecular retention into oral cavity and re-swallow *without awareness.  Chin down posture not helpful and SlP did not head turn due to appearance of approx one inch of IJ line out of neck. SLP phoned RN regarding concern - covered bandage with transpore and proceeded with testing as advised per RN. Liquid was appears helpful to decrease retention.  CHL IP CERVICAL ESOPHAGEAL PHASE 03/02/2020 Cervical Esophageal Phase WFL Pudding Teaspoon -- Pudding Cup -- Honey Teaspoon -- Honey Cup -- Nectar Teaspoon -- Nectar Cup -- Nectar Straw -- Thin Teaspoon -- Thin Cup -- Thin Straw -- Puree -- Mechanical Soft -- Regular -- Multi-consistency -- Pill -- Cervical Esophageal Comment -- Kathleen Lime, MS [facility] SLP Acute Rehab Services Office [phone] Pager [phone] Macario Golds 03/02/2020, 4:12 PM              [place] EKG SITE RITE  Result Date: 03/01/2020 If Site Rite image not attached, placement could not be confirmed due to current cardiac rhythm.   Cardiac Studies   Echocardiogram 02/25/2020: Impressions: 1. LVEF 50-55% with wall motion abnormalities suspicious for mid LAD. No  apical thrombus.  2. Left ventricular ejection fraction, by estimation, is 50 to 55%. The  left ventricle has low normal function. The left ventricle  demonstrates  regional wall motion abnormalities (see scoring diagram/findings for  description). Left ventricular diastolic  parameters are consistent with Grade II diastolic dysfunction  (pseudonormalization). Elevated left atrial pressure.  3. Right ventricular systolic function is normal. The right ventricular  size is normal. There is moderately elevated pulmonary artery systolic  pressure. The estimated right ventricular systolic pressure is 94.7 mmHg.  4. Left atrial size was mildly dilated.  5. The mitral valve is normal in structure. Moderate mitral valve  regurgitation. No evidence of mitral stenosis.  6. The aortic valve is normal in structure. Aortic valve regurgitation is  not visualized. Mild to moderate aortic valve sclerosis/calcification is  present, without any evidence of aortic stenosis.  7. The inferior vena cava is normal in size with greater than 50%  respiratory variability, suggesting right atrial pressure of 3 mmHg.   Patient Profile   Mr. Mcdonell is a 60 y.o. male with a history of hypertension and type 2 diabetes but no known cardiac history who was admitted on 02/22/2020 for further evaluation of hematochezia. On 02/23/2020, patient had a chest pain episode with blood bowel movement. Chest pain resolved with Nitro. However, he then had recurrent chest pain and actually had a VT arrest. Received 2-5 minutes of CPR and was cardioverted. Post-resuscitation rhythm was trial fibrillation. Cardiology was consulted for further evaluation/management of VF arrest and atrial fibrillation.  Assessment & Plan    VT Arrest NSTEMI - Patient had VT arrest on 1/4 in setting of ongoing GI bleeding. ROSC obtained after about 2-5 minutes of CPR and cardioversion.  - High-sensitivity troponin peaked at >15,000 on 1/5 and has trended down. - Most recent EKG on 1/11 shows normal sinus rhythm with possible Q waves in V1-V3 and ST depression in V4-V6 as well as lead II. - Echo  showed LVEF of 50-55% with hypokinesis of mid and distal anterior wall, mid and distal anterior septum, and apex as well as grade 2 diastolic dysfunction. Moderate MR and moderately elevated PASP of 48.7 mmHg also noted. - IV Heparin was started on 1/6 without bolus after clearance with GI. However, he had recurrent bleeding midnight on 1/8. Therefore, Heparin was stopped. - Patient denies any chest pain.  - Continue Aspirin.  - Continue Lopressor 25mg  twice daily. -  Home statin on hold due to elevated LFTs. Can restart once these normalize. - Will ultimately need cardiac catheterization prior to discharge. Hopefully tomorrow. Will discuss with MD. We may need to make sure GI is OK with this given the last time we tried to start Heparin he had recurrent GI bleed.  Possible Cardiogenic Shock - COOX in the 50's on 1/5 consistent with cardiogenic shock but was 97 on last check on 1/6.  - Patient was started on pressors after VT arrest but these have all been weaned off. Has not been on any pressors since 1/8. - Patient now being diuresed with IV Lasix with good  urinary response. Documented urinary output of 3.15 L yesterday but net positive 2.6 L this admission. No documented weight for today but was down 5lbs from admission yesterday. Renal function stable.  - Continue IV Lasix for now. May be able to transition to PO soon. - Continue to monitor daily weight, strict I/O's, and renal function.  Acute Hypoxic Respiratory Failure Post Arrest Requiring Intubation Suspect Aspiration due to Cardiac Arrest Acute Pulmonary Edema - Extubated on 1/11. O2 sats now in the high 90's on room air. - Diuresis as above.  - Continue antibiotics per primary team. - Management per primary team.  Atrial Fibrillation with RVR - Post arrest rhythm with atrial fibrillation with RVR. However, he then converted to sinus rhythm.  - Maintaining sinus rhythm with multiple runs of AIVR overnight.  - Continue Lopressor  as above. - Not an anticoagulation candidate with ongoing GI bleed.  Ventricular Escape  Prolonged QTc - Noted to have ventricular escape overnight on 1/10-1/11. EKG that night showed prolonged QTc of 529 ms.  - Resolved with decrease in Precedex.  - Most recent EKG on 1/11 shows improvement in QTc to 477 ms. - Patient had multiple runs of accelerated idioventricular rhythm overnight - these runs seemed to last longer and longer until patient had about a 1 minute run around 5:40am. Since then no significant runs. Rates in the 70's to 80's.  - Will recheck EKG today.  Hypertension - BP elevated. Systolic BP in the 034'J to 160's yesterday.  - Continue Lopressor 25mg  twice daily. - Home Losartan-HCTZ and Amlodipine currently on hold. Consider restarting home Amlodipine.  Hyperlipidemia - Home Lipitor on hold for now given elevated LFTs. - Can restart when LFTs normalize (should be soon).  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 Felt to be from diverticular bleed. - EGD on 1/8 negative for bleeding or ulcer.  - Hemoglobin as low as 7.2 this admission. S/p a total of 7 units of PRBCs (last received 4 units on 1/8) Stable at 10.0 yesterday.  - No recurrent bleeding. - Management per primary team and GI.  Hypokalemia - Potassium 3.2 this morning. Being repleted by primary team. - Will recheck Magnesium. - Continue to monitor closely with diuresis.  Otherwise, per primary team: - Possible seizure like activity - Fever - Diabetes Mellitus with DKA/HHS  For questions or updates, please contact [place] Please consult [email or web address] for contact info under        Signed, Darreld Mclean, PA-C  03/03/2020, 7:43 AM     History and all data above reviewed.  No chest pain.  No SOB.  Patient examined.  I agree with the findings as above.  The patient exam reveals  COR:RRR  ,  Lungs: Clear  ,  Abd: Positive bowel sounds, no rebound no guarding, Ext No edema  .  All available labs, radiology testing, previous records reviewed. Agree with documented assessment and plan.   Vfib arrest:  Plan cardiac cath tomorrow given this and the increased troponin and the mildly reduced EF.  The patient understands that risks included but are not limited to stroke (1 in 1000), death (1 in 80), kidney failure [usually temporary] (1 in 500), bleeding (1 in 200), allergic reaction [possibly serious] (1 in 200).  The patient understands and agrees to proceed.     QT prolonged:  This was not evident on admission.  He has had mag supplemented.  Currently not on traditional drugs associated with QT prolongation although might be associated and I have asked if we can stop this.  We will continue to supplement the potassium.  I have asked Dr. Rayann Heman with EP to review.  He is having idioventricular rhythm on telemetry.     Jeneen Rinks Tykira Wachs  2:17 PM  03/03/2020

## 2020-03-03 NOTE — TOC Progression Note (Signed)
Transition of Care [facility]) - Progression Note    Patient Details  Name: Benuel Ly MRN: [id number] Date of Birth: September 14, 1960  [facility]) CM/SW Contact  Melton Walls, Juliann Pulse, RN Phone Number: 03/03/2020, 2:09 PM  Clinical Narrative: Noted CIR rep note.d/c plan home per spouse.    Expected Discharge Plan: Home/Self Care Barriers to Discharge: Continued Medical Work up  Expected Discharge Plan and Services Expected Discharge Plan: Home/Self Care   Discharge Planning Services: CM Consult   Living arrangements for the past 2 months: Single Family Home                                       Social Determinants of Health (SDOH) Interventions    Readmission Risk Interventions No flowsheet data found.

## 2020-03-04 ENCOUNTER — Encounter (HOSPITAL_COMMUNITY)
Admission: EM | Disposition: A | Discharge status: Home / Self Care | Payer: Self-pay | Source: Home / Self Care | Attending: Pulmonary Disease

## 2020-03-04 ENCOUNTER — Inpatient Hospital Stay (HOSPITAL_COMMUNITY): Payer: Managed Care, Other (non HMO)

## 2020-03-04 ENCOUNTER — Encounter (HOSPITAL_COMMUNITY): Payer: Self-pay | Admitting: Cardiovascular Disease

## 2020-03-04 DIAGNOSIS — Z8679 Personal history of other diseases of the circulatory system: Secondary | ICD-10-CM

## 2020-03-04 DIAGNOSIS — K922 Gastrointestinal hemorrhage, unspecified: Secondary | ICD-10-CM | POA: Diagnosis not present

## 2020-03-04 DIAGNOSIS — I25110 Atherosclerotic heart disease of native coronary artery with unstable angina pectoris: Secondary | ICD-10-CM

## 2020-03-04 DIAGNOSIS — I442 Atrioventricular block, complete: Secondary | ICD-10-CM

## 2020-03-04 DIAGNOSIS — I469 Cardiac arrest, cause unspecified: Secondary | ICD-10-CM | POA: Diagnosis not present

## 2020-03-04 DIAGNOSIS — J9601 Acute respiratory failure with hypoxia: Secondary | ICD-10-CM

## 2020-03-04 DIAGNOSIS — I2510 Atherosclerotic heart disease of native coronary artery without angina pectoris: Secondary | ICD-10-CM | POA: Diagnosis not present

## 2020-03-04 DIAGNOSIS — I214 Non-ST elevation (NSTEMI) myocardial infarction: Secondary | ICD-10-CM

## 2020-03-04 HISTORY — PX: LEFT HEART CATH AND CORONARY ANGIOGRAPHY: CATH118249

## 2020-03-04 LAB — PULMONARY FUNCTION TEST
FEF 25-75 Pre: 1.89 L/sec
FEV1-Pre: 1.47 L
FEV6-Pre: 1.62 L
FVC-Pre: 1.62 L
Pre FEV1/FVC ratio: 91 %
Pre FEV6/FVC Ratio: 100 %

## 2020-03-04 LAB — BASIC METABOLIC PANEL
Anion gap: 10 (ref 5–15)
BUN: 6 mg/dL (ref 6–20)
CO2: 25 mmol/L (ref 22–32)
Calcium: 8.6 mg/dL — ABNORMAL LOW (ref 8.9–10.3)
Chloride: 103 mmol/L (ref 98–111)
Creatinine, Ser: 0.78 mg/dL (ref 0.61–1.24)
GFR, Estimated: >60 mL/min (ref >60)
Glucose, Bld: 167 mg/dL — ABNORMAL HIGH (ref 70–99)
Potassium: 3.9 mmol/L (ref 3.5–5.1)
Sodium: 138 mmol/L (ref 135–145)

## 2020-03-04 LAB — SURGICAL PCR SCREEN
MRSA, PCR: NEGATIVE
Staphylococcus aureus: NEGATIVE

## 2020-03-04 LAB — PROCALCITONIN: Procalcitonin: <0.1 ng/mL

## 2020-03-04 LAB — GLUCOSE, CAPILLARY
Glucose-Capillary: 164 mg/dL — ABNORMAL HIGH (ref 70–99)
Glucose-Capillary: 125 mg/dL — ABNORMAL HIGH (ref 70–99)
Glucose-Capillary: 111 mg/dL — ABNORMAL HIGH (ref 70–99)
Glucose-Capillary: 130 mg/dL — ABNORMAL HIGH (ref 70–99)
Glucose-Capillary: 235 mg/dL — ABNORMAL HIGH (ref 70–99)
Glucose-Capillary: 136 mg/dL — ABNORMAL HIGH (ref 70–99)

## 2020-03-04 LAB — PHOSPHORUS: Phosphorus: 3.1 mg/dL (ref 2.5–4.6)

## 2020-03-04 LAB — MAGNESIUM: Magnesium: 2.1 mg/dL (ref 1.7–2.4)

## 2020-03-04 SURGERY — LEFT HEART CATH AND CORONARY ANGIOGRAPHY
Anesthesia: LOCAL

## 2020-03-04 MED ORDER — SODIUM CHLORIDE 0.9 % IV SOLN: INTRAVENOUS | Status: DC

## 2020-03-04 MED ORDER — MIDAZOLAM HCL 2 MG/2ML IJ SOLN
INTRAMUSCULAR | Status: AC
  Filled 2020-03-04: qty 2

## 2020-03-04 MED ORDER — LIDOCAINE HCL (PF) 1 % IJ SOLN
INTRAMUSCULAR | Status: AC
  Filled 2020-03-04: qty 30

## 2020-03-04 MED ORDER — LABETALOL HCL 5 MG/ML IV SOLN: 10.0000 mg | INTRAVENOUS | Status: AC | PRN

## 2020-03-04 MED ORDER — POTASSIUM CHLORIDE CRYS ER 20 MEQ PO TBCR
40.0000 meq | EXTENDED_RELEASE_TABLET | Freq: Two times a day (BID) | ORAL | Status: AC
  Administered 2020-03-04 – 2020-03-05 (×3): 40 meq via ORAL
  Filled 2020-03-04 (×3): qty 2

## 2020-03-04 MED ORDER — SODIUM CHLORIDE 0.9% FLUSH: 3.0000 mL | INTRAVENOUS | Status: DC | PRN

## 2020-03-04 MED ORDER — SODIUM CHLORIDE 0.9% FLUSH
3.0000 mL | Freq: Two times a day (BID) | INTRAVENOUS | Status: DC
  Administered 2020-03-04 – 2020-03-07 (×5): 3 mL via INTRAVENOUS

## 2020-03-04 MED ORDER — MUPIROCIN 2 % EX OINT
1.0000 "application " | TOPICAL_OINTMENT | Freq: Two times a day (BID) | CUTANEOUS | Status: DC
  Administered 2020-03-04 – 2020-03-07 (×7): 1 via NASAL
  Filled 2020-03-04: qty 22

## 2020-03-04 MED ORDER — FENTANYL CITRATE (PF) 100 MCG/2ML IJ SOLN
INTRAMUSCULAR | Status: AC
  Filled 2020-03-04: qty 2

## 2020-03-04 MED ORDER — HEPARIN (PORCINE) IN NACL 1000-0.9 UT/500ML-% IV SOLN
INTRAVENOUS | Status: DC | PRN
  Administered 2020-03-04 (×2): 500 mL

## 2020-03-04 MED ORDER — FENTANYL CITRATE (PF) 100 MCG/2ML IJ SOLN
INTRAMUSCULAR | Status: DC | PRN
  Administered 2020-03-04: 50 ug via INTRAVENOUS

## 2020-03-04 MED ORDER — MIDAZOLAM HCL 2 MG/2ML IJ SOLN
INTRAMUSCULAR | Status: DC | PRN
  Administered 2020-03-04: 2 mg via INTRAVENOUS

## 2020-03-04 MED ORDER — SODIUM CHLORIDE 0.9% FLUSH
3.0000 mL | Freq: Two times a day (BID) | INTRAVENOUS | Status: DC
Start: 2020-03-04 — End: 2020-03-04

## 2020-03-04 MED ORDER — VERAPAMIL HCL 2.5 MG/ML IV SOLN
INTRAVENOUS | Status: AC
  Filled 2020-03-04: qty 2

## 2020-03-04 MED ORDER — SODIUM CHLORIDE 0.9% FLUSH
3.0000 mL | INTRAVENOUS | Status: DC | PRN
  Administered 2020-03-05 – 2020-03-06 (×3): 3 mL via INTRAVENOUS

## 2020-03-04 MED ORDER — LIDOCAINE HCL (PF) 1 % IJ SOLN
INTRAMUSCULAR | Status: DC | PRN
  Administered 2020-03-04: 2 mL

## 2020-03-04 MED ORDER — SODIUM CHLORIDE 0.9 % IV SOLN: 250.0000 mL | INTRAVENOUS | Status: DC | PRN

## 2020-03-04 MED ORDER — HEPARIN (PORCINE) IN NACL 1000-0.9 UT/500ML-% IV SOLN
INTRAVENOUS | Status: AC
  Filled 2020-03-04: qty 1000

## 2020-03-04 MED ORDER — VERAPAMIL HCL 2.5 MG/ML IV SOLN
INTRAVENOUS | Status: DC | PRN
  Administered 2020-03-04: 10 mL via INTRA_ARTERIAL

## 2020-03-04 MED ORDER — HEPARIN SODIUM (PORCINE) 1000 UNIT/ML IJ SOLN
INTRAMUSCULAR | Status: AC
  Filled 2020-03-04: qty 1

## 2020-03-04 MED ORDER — MAGNESIUM OXIDE 400 (241.3 MG) MG PO TABS
400.0000 mg | ORAL_TABLET | Freq: Two times a day (BID) | ORAL | Status: DC
  Administered 2020-03-04 – 2020-03-07 (×7): 400 mg via ORAL
  Filled 2020-03-04 (×7): qty 1

## 2020-03-04 MED ORDER — SODIUM CHLORIDE 0.9 % IV SOLN: INTRAVENOUS | Status: AC

## 2020-03-04 MED ORDER — HEPARIN SODIUM (PORCINE) 1000 UNIT/ML IJ SOLN
INTRAMUSCULAR | Status: DC | PRN
  Administered 2020-03-04: 5000 [IU] via INTRAVENOUS

## 2020-03-04 MED ORDER — HYDRALAZINE HCL 20 MG/ML IJ SOLN: 10.0000 mg | INTRAMUSCULAR | Status: AC | PRN

## 2020-03-04 MED ORDER — IOHEXOL 350 MG/ML SOLN
INTRAVENOUS | Status: DC | PRN
  Administered 2020-03-04: 85 mL via INTRA_ARTERIAL

## 2020-03-04 SURGICAL SUPPLY — 10 items
CATH 5FR JL3.5 JR4 ANG PIG MP (CATHETERS) ×1 IMPLANT
CATH INFINITI 5 FR 3DRC (CATHETERS) ×1 IMPLANT
DEVICE RAD COMP TR BAND LRG (VASCULAR PRODUCTS) ×1 IMPLANT
GLIDESHEATH SLEND SS 6F .021 (SHEATH) ×1 IMPLANT
GUIDEWIRE INQWIRE 1.5J.035X260 (WIRE) IMPLANT
INQWIRE 1.5J .035X260CM (WIRE) ×2
KIT HEART LEFT (KITS) ×2 IMPLANT
PACK CARDIAC CATHETERIZATION (CUSTOM PROCEDURE TRAY) ×2 IMPLANT
TRANSDUCER W/STOPCOCK (MISCELLANEOUS) ×2 IMPLANT
TUBING CIL FLEX 10 FLL-RA (TUBING) ×2 IMPLANT

## 2020-03-04 NOTE — Consult Note (Addendum)
[address]       [place] [address]             [phone]        Deforrest Pavlock McSwain Medical Record #[id number] Date of Birth: 02/05/1961  Referring: No ref. provider found Primary Care: London Pepper, MD Primary Cardiologist:Heather Renae Fickle, MD  Chief Complaint:    Chief Complaint  Patient presents with  . Rectal Bleeding    History of Present Illness:      Mr. Mcmillen is a 60 year old male patient with a past medical history significant for hypertension, hyperlipidemia, type 2 diabetes mellitus, and gout who presented to [facility] on 02/22/2020 for further evaluation of hematochezia thought to be from diverticula throughout the entire colon.  On 02/23/2020 the patient had some chest pain associated with a bloody bowel movement.  His chest pain resolved with nitroglycerin however, he had recurrent chest pain and had a VT arrest.  He received 2 to 5 minutes of CPR and was cardioverted.  Post resuscitation, the patient was in atrial fibrillation.  Cardiology was consulted at this time for further evaluation and management.  Due to his chest pain and comorbidities it was decided to undergo cardiac catheterization on 03/04/2020.  Cardiac catheterization showed 70% stenosis of the proximal RCA with 50% stenosis of the distal RCA, 70% stenosis of the right PDA, 99% stenosis of the third right PL, 99% stenosis of the second right PL, 90% stenosis of the mid LAD with an ostial LAD stenosis of 70%, 100% stenosis of the ostial to proximal circumflex, and 60% stenosis of the mid LM to ostial LAD.  Due to his severe proximal LAD stenosis and severe mid LAD stenosis coronary bypass grafting was recommended.  We are consulted for possible revascularization.  An echocardiogram was performed on 02/25/2020 which showed an estimated left ventricular ejection fraction of 50 to 55%.  Moderate mitral valve regurgitation with no mitral valve stenosis was found.  There was mild  to moderate aortic valve calcification without any evidence of stenosis. He hasn't had any further hematochezia and his H and H is 11.3/35.8.    Before this admission, he was independent and active. He moved to [place] last May for love and got married to his now wife. Before the move, he was working as a Psychologist, sport and exercise. He has three children by marriage.    Current Activity/ Functional Status: Patient was independent with mobility/ambulation, transfers, ADL's, IADL's.   Zubrod Score: At the time of surgery this patient's most appropriate activity status/level should be described as: []     0    Normal activity, no symptoms [x]     1    Restricted in physical strenuous activity but ambulatory, able to do out light work []     2    Ambulatory and capable of self care, unable to do work activities, up and about                 more than 50%  Of the time                            []     3    Only limited self care, in bed greater than 50% of waking hours []     4    Completely disabled, no self care, confined to bed or chair []     5    Moribund  Past Medical History:  Diagnosis Date  . Diabetes mellitus without complication ([place])   . Hypertension     Past Surgical History:  Procedure Laterality Date  . COLONOSCOPY WITH PROPOFOL Left 02/27/2020   Procedure: COLONOSCOPY WITH PROPOFOL;  Surgeon: Otis Brace, MD;  Location: [facility];  Service: Gastroenterology;  Laterality: Left;  . ESOPHAGOGASTRODUODENOSCOPY (EGD) WITH PROPOFOL N/A 02/27/2020   Procedure: ESOPHAGOGASTRODUODENOSCOPY (EGD) WITH PROPOFOL;  Surgeon: Otis Brace, MD;  Location: [facility];  Service: Gastroenterology;  Laterality: N/A;    Social History   Tobacco Use  Smoking Status Never Smoker  Smokeless Tobacco Never Used    Social History   Substance and Sexual Activity  Alcohol Use Yes     No Known Allergies  Current Facility-Administered Medications  Medication Dose Route Frequency Provider  Last Rate Last Admin  . [MAR Hold] 0.9 %  sodium chloride infusion   Intravenous PRN Hunsucker, Bonna Gains, MD 10 mL/hr at 03/02/20 1127 Infusion Verify at 03/02/20 1127  . 0.9 %  sodium chloride infusion  250 mL Intravenous PRN Sarajane Jews, Callie E, PA-C      . 0.9 %  sodium chloride infusion   Intravenous Continuous Minus Breeding, MD 10 mL/hr at 03/04/20 0647 New Bag at 03/04/20 7846  . 0.9 %  sodium chloride infusion   Intravenous Continuous Burnell Blanks, MD 75 mL/hr at 03/04/20 0943 Rate Change at 03/04/20 0943  . [MAR Hold] alum & mag hydroxide-simeth (MAALOX/MYLANTA) 200-200-20 MG/5ML suspension 30 mL  30 mL Per Tube Q4H PRN Hunsucker, Bonna Gains, MD      . Doug Sou Hold] aspirin chewable tablet 81 mg  81 mg Oral Daily Isaiah Serge, NP   81 mg at 03/04/20 0554  . [MAR Hold] Chlorhexidine Gluconate Cloth 2 % PADS 6 each  6 each Topical Q0600 Patrecia Pour, MD   6 each at 03/04/20 [phone]  . [MAR Hold] docusate (COLACE) 50 MG/5ML liquid 100 mg  100 mg Per Tube BID Collene Gobble, MD   100 mg at 03/01/20 0944  . [MAR Hold] furosemide (LASIX) injection 40 mg  40 mg Intravenous Daily Brand Males, MD   40 mg at 03/03/20 0844  . [MAR Hold] insulin aspart (novoLOG) injection 0-9 Units  0-9 Units Subcutaneous Q4H Magdalen Spatz, NP   2 Units at 03/04/20 0500  . [MAR Hold] insulin glargine (LANTUS) injection 10 Units  10 Units Subcutaneous BID Magdalen Spatz, NP   10 Units at 03/03/20 2203  . [MAR Hold] magnesium oxide (MAG-OX) tablet 400 mg  400 mg Oral BID Pokhrel, Laxman, MD      . Doug Sou Hold] metoprolol tartrate (LOPRESSOR) tablet 25 mg  25 mg Oral BID Minus Breeding, MD   25 mg at 03/03/20 2204  . [MAR Hold] mupirocin ointment (BACTROBAN) 2 % 1 application  1 application Nasal BID Pokhrel, Laxman, MD      . Doug Sou Hold] nitroGLYCERIN (NITROSTAT) SL tablet 0.4 mg  0.4 mg Sublingual Q5 min PRN Kathie Dike, MD   0.4 mg at 02/23/20 2002  . [MAR Hold] pantoprazole (PROTONIX) EC tablet 40 mg   40 mg Oral Q1200 Brand Males, MD   40 mg at 03/03/20 1248  . [MAR Hold] phenol (CHLORASEPTIC) mouth spray 1 spray  1 spray Mouth/Throat PRN Anders Simmonds, MD   1 spray at 03/01/20 2309  . [MAR Hold] polyethylene glycol (MIRALAX / GLYCOLAX) packet 17 g  17 g Per Tube Daily Byrum, Rose Fillers, MD  17 g at 03/01/20 0944  . [MAR Hold] potassium chloride SA (KLOR-CON) CR tablet 40 mEq  40 mEq Oral BID Pokhrel, Laxman, MD      . Doug Sou Hold] sodium chloride flush (NS) 0.9 % injection 10-40 mL  10-40 mL Intracatheter Q12H Hunsucker, Bonna Gains, MD   20 mL at 03/02/20 1001  . [MAR Hold] sodium chloride flush (NS) 0.9 % injection 10-40 mL  10-40 mL Intracatheter PRN Hunsucker, Bonna Gains, MD      . sodium chloride flush (NS) 0.9 % injection 3 mL  3 mL Intravenous Q12H Goodrich, Callie E, PA-C      . sodium chloride flush (NS) 0.9 % injection 3 mL  3 mL Intravenous PRN Sande Rives E, PA-C       Facility-Administered Medications Ordered in Other Encounters  Medication Dose Route Frequency Provider Last Rate Last Admin  . etomidate (AMIDATE) injection   Intravenous Anesthesia Intra-op Gean Maidens, CRNA   20 mg at 02/23/20 2028  . succinylcholine (ANECTINE) injection   Intravenous Anesthesia Intra-op Gean Maidens, CRNA   120 mg at 02/23/20 2028    Medications Prior to Admission  Medication Sig Dispense Refill Last Dose  . allopurinol (ZYLOPRIM) 100 MG tablet Take 100 mg by mouth daily.   02/21/2020 at Unknown time  . amLODipine (NORVASC) 10 MG tablet Take 10 mg by mouth daily.   02/21/2020 at Unknown time  . Ascorbic Acid (VITAMIN C) 1000 MG tablet Take 1,000 mg by mouth daily.   02/21/2020 at Unknown time  . aspirin EC 81 MG tablet Take 81 mg by mouth daily. Swallow whole.   02/21/2020 at Unknown time  . atorvastatin (LIPITOR) 40 MG tablet Take 40 mg by mouth daily.   02/21/2020 at Unknown time  . glyBURIDE (DIABETA) 5 MG tablet Take 5 mg by mouth 2 (two) times daily with a meal.   02/21/2020 at  Unknown time  . losartan-hydrochlorothiazide (HYZAAR) 100-25 MG tablet Take 1 tablet by mouth daily.   02/21/2020 at Unknown time  . Magnesium 300 MG CAPS Take 300 mg by mouth daily.   02/21/2020 at Unknown time  . metFORMIN (GLUCOPHAGE) 1000 MG tablet Take 1,000 mg by mouth 2 (two) times daily with a meal.   02/21/2020 at Unknown time  . metoprolol tartrate (LOPRESSOR) 25 MG tablet Take 25 mg by mouth 2 (two) times daily.   02/21/2020 at 8 pm  . Multiple Vitamin (MULTIVITAMIN WITH MINERALS) TABS tablet Take 1 tablet by mouth daily.   02/21/2020 at Unknown time  . Vitamin D, Ergocalciferol, (DRISDOL) 1.25 MG (50000 UNIT) CAPS capsule Take 50,000 Units by mouth every 7 (seven) days.   02/16/2020    History reviewed. No pertinent family history.   Review of Systems:   Review of Systems  Respiratory: Negative for shortness of breath.   Cardiovascular: Negative for chest pain and leg swelling.   Pertinent items are noted in HPI.     Physical Exam: BP (!) 166/80   Pulse 67   Temp (!) 97.5 F (36.4 C) (Oral)   Resp (!) 23   Ht 5\' 8"  (1.727 m)   Wt 86.1 kg   SpO2 98%   BMI 28.86 kg/m    General appearance: alert, cooperative and no distress Resp: clear to auscultation bilaterally Cardio: regular rate and rhythm, S1, S2 normal, no murmur, click, rub or gallop GI: soft, non-tender; bowel sounds normal; no masses,  no organomegaly Extremities: extremities normal, atraumatic, no cyanosis or  edema Neurologic: Grossly normal  Diagnostic Studies & Laboratory data:     Recent Radiology Findings:   CARDIAC CATHETERIZATION  Result Date: 03/04/2020  Prox RCA lesion is 70% stenosed.  Dist RCA lesion is 50% stenosed.  RPDA lesion is 70% stenosed.  3rd RPL lesion is 99% stenosed.  2nd RPL lesion is 99% stenosed.  Mid LAD lesion is 90% stenosed.  Ost LAD to Prox LAD lesion is 70% stenosed.  Ost Cx to Prox Cx lesion is 100% stenosed.  Mid LM to Ost LAD lesion is 60% stenosed.  1. Severe  proximal LAD stenosis. Severe mid LAD stenosis. 2. Complete occlusion of the proximal Circumflex with filling by right to left and left to left collaterals. 3. The RCA is a large dominant vessel with severe proximal stenosis, moderate distal stenosis. Severe stenosis in the posterolateral artery and the PDA. 4. LVEDP 14 Recommendations: He has severe three vessel CAD. Will keep at [facility] and ask CT surgery to consult for CABG.   DG Swallowing Func-Speech Pathology  Result Date: 03/02/2020 Objective Swallowing Evaluation: Type of Study: Bedside Swallow Evaluation  Patient Details Name: Cobe Viney MRN: [id number] Date of Birth: [birth year] Today's Date: 03/02/2020 Time: SLP Start Time (ACUTE ONLY): 1455 -SLP Stop Time (ACUTE ONLY): 8250 SLP Time Calculation (min) (ACUTE ONLY): 40 min Past Medical History: Past Medical History: Diagnosis Date . Diabetes mellitus without complication ([place])  . Hypertension  Past Surgical History: Past Surgical History: Procedure Laterality Date . COLONOSCOPY WITH PROPOFOL Left 02/27/2020  Procedure: COLONOSCOPY WITH PROPOFOL;  Surgeon: Otis Brace, MD;  Location: [facility];  Service: Gastroenterology;  Laterality: Left; . ESOPHAGOGASTRODUODENOSCOPY (EGD) WITH PROPOFOL N/A 02/27/2020  Procedure: ESOPHAGOGASTRODUODENOSCOPY (EGD) WITH PROPOFOL;  Surgeon: Otis Brace, MD;  Location: [facility];  Service: Gastroenterology;  Laterality: N/A; HPI: pt is a 60 yo male adm with GI bleed.  Pt underwent endoscopy and after had AMS, ? seizure activity with cardiac arrest requiring cardioversion and CPR.  Intubated 1/5 to 03/01/2020. Swallow eval ordered.  Pt also has h/o DM.  His CXR showed multifocal pna vs edema.  He is currently on room air.  Pt admits to some issues with swallowing prior to admit but does not expand on information.  Subjective: pt awake in chair Assessment / Plan / Recommendation CHL IP CLINICAL IMPRESSIONS 03/02/2020 Clinical Impression Min oral and mild pharyngeal  dysphagia present likely due to potential edema from pt's prolonged intubation.  He demonstrates retention of secretions in pharynx without sensation.  Mildly impaired tongue base retraction, laryngeal elevation/closure allows laryngeal penetration of liquids and vallecular retention.  Pt denies sensation to retention however will frequently propel vallecular residual into oral cavity and reswallow.  No aspiration but pt demonstrates laryngeal penetration to cords with thin.  Cued cough/throat clearing removed trace penetration.  Pharyngeal retention worse with solid than liquids - thus recommend start with full liquid diet with strict precautions.  SlP will follow up next date for tolerance and readinesss for dietary advancement.  Using teach back with live feed, pt educated to recommendations and precautions.  Of note, pt did NOT cough during the MBS, but overtly coughed and expectorated viscous secretions *minimal blood tinged* after swallowing water.  Anticipate acute dysphagia to improve with decreased pharyngeal edema and conditioning. SLP Visit Diagnosis Dysphagia, pharyngeal phase (R13.13);Dysphagia, pharyngoesophageal phase (R13.14) Attention and concentration deficit following -- Frontal lobe and executive function deficit following -- Impact on safety and function Mild aspiration risk   CHL IP TREATMENT RECOMMENDATION 03/02/2020 Treatment  Recommendations Therapy as outlined in treatment plan below   Prognosis 03/02/2020 Prognosis for Safe Diet Advancement Fair Barriers to Reach Goals -- Barriers/Prognosis Comment -- CHL IP DIET RECOMMENDATION 03/02/2020 SLP Diet Recommendations Thin liquid Liquid Administration via -- Medication Administration Whole meds with puree Compensations Slow rate;Small sips/bites;Other (Comment) Postural Changes --   CHL IP OTHER RECOMMENDATIONS 03/02/2020 Recommended Consults -- Oral Care Recommendations Oral care QID Other Recommendations --   CHL IP FOLLOW UP RECOMMENDATIONS  03/02/2020 Follow up Recommendations (No Data)   CHL IP FREQUENCY AND DURATION 03/02/2020 Speech Therapy Frequency (ACUTE ONLY) min 1 x/week Treatment Duration 1 week      CHL IP ORAL PHASE 03/02/2020 Oral Phase Impaired Oral - Pudding Teaspoon -- Oral - Pudding Cup -- Oral - Honey Teaspoon -- Oral - Honey Cup -- Oral - Nectar Teaspoon -- Oral - Nectar Cup WFL Oral - Nectar Straw WFL Oral - Thin Teaspoon WFL Oral - Thin Cup WFL Oral - Thin Straw WFL Oral - Puree Lingual pumping Oral - Mech Soft Lingual pumping Oral - Regular -- Oral - Multi-Consistency -- Oral - Pill -- Oral Phase - Comment pt propels boluses from pharynx/pharyngeal tongue base into oral cavity and re-swallows  CHL IP PHARYNGEAL PHASE 03/02/2020 Pharyngeal Phase Impaired Pharyngeal- Pudding Teaspoon -- Pharyngeal -- Pharyngeal- Pudding Cup -- Pharyngeal -- Pharyngeal- Honey Teaspoon -- Pharyngeal -- Pharyngeal- Honey Cup -- Pharyngeal -- Pharyngeal- Nectar Teaspoon Reduced tongue base retraction;Reduced epiglottic inversion;Reduced laryngeal elevation Pharyngeal Material does not enter airway Pharyngeal- Nectar Cup -- Pharyngeal -- Pharyngeal- Nectar Straw Reduced tongue base retraction;Penetration/Aspiration during swallow;Reduced epiglottic inversion;Reduced laryngeal elevation Pharyngeal Material enters airway, remains ABOVE vocal cords and not ejected out Pharyngeal- Thin Teaspoon Reduced tongue base retraction;Reduced epiglottic inversion Pharyngeal Material enters airway, remains ABOVE vocal cords and not ejected out Pharyngeal- Thin Cup Reduced epiglottic inversion;Reduced laryngeal elevation;Reduced airway/laryngeal closure;Reduced tongue base retraction;Penetration/Aspiration during swallow;Penetration/Apiration after swallow Pharyngeal Material enters airway, CONTACTS cords and not ejected out Pharyngeal- Thin Straw Reduced epiglottic inversion;Reduced laryngeal elevation Pharyngeal Material enters airway, passes BELOW cords without attempt by  patient to eject out (silent aspiration) Pharyngeal- Puree Reduced epiglottic inversion;Reduced tongue base retraction;Pharyngeal residue - valleculae;Reduced laryngeal elevation Pharyngeal Material does not enter airway Pharyngeal- Mechanical Soft Reduced epiglottic inversion;Reduced laryngeal elevation;Reduced tongue base retraction;Pharyngeal residue - valleculae Pharyngeal Material does not enter airway Pharyngeal- Regular -- Pharyngeal -- Pharyngeal- Multi-consistency -- Pharyngeal -- Pharyngeal- Pill WFL Pharyngeal Material does not enter airway Pharyngeal Comment Inconsistent strength of swallow - pharyngeal clearance - Cues for effortful swallow did not contribute to prevention of retention (approx 30% retained of pudding bolus alone) but tablet with puree (whole) easily cleared (100%).  Pt does not sense retention in pharynx however he will propel vallecular retention into oral cavity and re-swallow *without awareness. Chin down posture not helpful and SlP did not head turn due to appearance of approx one inch of IJ line out of neck. SLP phoned RN regarding concern - covered bandage with transpore and proceeded with testing as advised per RN. Liquid was appears helpful to decrease retention.  CHL IP CERVICAL ESOPHAGEAL PHASE 03/02/2020 Cervical Esophageal Phase WFL Pudding Teaspoon -- Pudding Cup -- Honey Teaspoon -- Honey Cup -- Nectar Teaspoon -- Nectar Cup -- Nectar Straw -- Thin Teaspoon -- Thin Cup -- Thin Straw -- Puree -- Mechanical Soft -- Regular -- Multi-consistency -- Pill -- Cervical Esophageal Comment -- Kathleen Lime, MS [facility] SLP Acute Rehab Services Office [phone] Pager [phone] Macario Golds 03/02/2020, 4:12  PM                I have independently reviewed the above radiologic studies and discussed with the patient   Recent Lab Findings: Lab Results  Component Value Date   WBC 7.9 03/03/2020   HGB 11.3 (L) 03/03/2020   HCT 35.8 (L) 03/03/2020   PLT 234 03/03/2020   GLUCOSE  167 (H) 03/04/2020   ALT 111 (H) 03/03/2020   AST 39 03/03/2020   NA 138 03/04/2020   K 3.9 03/04/2020   CL 103 03/04/2020   CREATININE 0.78 03/04/2020   BUN 6 03/04/2020   CO2 25 03/04/2020   TSH 1.033 02/23/2020   INR 1.3 (H) 02/27/2020   HGBA1C 10.3 (H) 02/22/2020      Assessment / Plan:      1. VT arrest, NSTEMI- EP consulted and assisting. IV heparin was started on 1/6 after GI clearance. He did have a recurrent bleed 1/8 and heparin was stopped. Continue lopressor, asa holding statin due to LFTs.  2. Multivessel CAD- cardiac cath done and results above. No chest pain at the moment. Nitro SL as needed 3. Acute hypoxic respiratory failure-He was intubated and then extubated on 1/11. Continue IV lasix with potassium supplementation 4. Afib with RVR-post arrest atrial fibrillation. Maintaining NSR. Not currently an anticoagulation candidate 5. Ventricular Escape/prolonged QTc-multiple runs of idioventricular rhythm, EP following 6. Hyperlipidemia 7. Hypertension 8. GI bleed likely due to diverticula throughout the colon. Stable H and H. Last bleed noted to be 1/8 8. DM type 2-blood glucose well controlled on insulin  Plan: Earliest OR availability is Monday 1/17 with Dr. Orvan Seen. Plan to continue to tune-up medically until then. Coronary artery bypass grafting discussed with the patient and all questions answered. His wife will be at the hospital today and we review the plan with her when she arrives.   I  spent 30 minutes counseling the patient face to face.   Nicholes Rough, PA-C 03/04/2020 11:19 AM

## 2020-03-04 NOTE — Interval H&P Note (Signed)
History and Physical Interval Note:  03/04/2020 8:34 AM  Colton Guzman  has presented today for surgery, with the diagnosis of NSTEMI.  The various methods of treatment have been discussed with the patient and family. After consideration of risks, benefits and other options for treatment, the patient has consented to  Procedure(s): LEFT HEART CATH AND CORONARY ANGIOGRAPHY (N/A) as a surgical intervention.  The patient's history has been reviewed, patient examined, no change in status, stable for surgery.  I have reviewed the patient's chart and labs.  Questions were answered to the patient's satisfaction.    Cath Lab Visit (complete for each Cath Lab visit)  Clinical Evaluation Leading to the Procedure:   ACS: Yes.    Non-ACS:    Anginal Classification: CCS III  Anti-ischemic medical therapy: Minimal Therapy (1 class of medications)  Non-Invasive Test Results: No non-invasive testing performed  Prior CABG: No previous CABG        Lauree Chandler

## 2020-03-04 NOTE — Progress Notes (Signed)
Progress Note  Patient Name: Artavius Stearns Date of Encounter: 03/04/2020  [facility] Cardiologist: Freada Bergeron, MD   Subjective   No acute overnight events.   Now post cath.  In retrospect he has had some exertional chest pain and decreased exercise tolerance for some time.    Inpatient Medications    Scheduled Meds: . aspirin  81 mg Oral Daily  . Chlorhexidine Gluconate Cloth  6 each Topical Q0600  . docusate  100 mg Per Tube BID  . furosemide  40 mg Intravenous Daily  . insulin aspart  0-9 Units Subcutaneous Q4H  . insulin glargine  10 Units Subcutaneous BID  . magnesium oxide  400 mg Oral BID  . metoprolol tartrate  25 mg Oral BID  . mupirocin ointment  1 application Nasal BID  . pantoprazole  40 mg Oral Q1200  . polyethylene glycol  17 g Per Tube Daily  . potassium chloride  40 mEq Oral BID  . sodium chloride flush  10-40 mL Intracatheter Q12H  . sodium chloride flush  3 mL Intravenous Q12H   Continuous Infusions: . sodium chloride 10 mL/hr at 03/02/20 1127  . sodium chloride    . sodium chloride 10 mL/hr at 03/04/20 0647   PRN Meds: sodium chloride, sodium chloride, alum & mag hydroxide-simeth, nitroGLYCERIN, phenol, sodium chloride flush, sodium chloride flush   Vital Signs    Vitals:   03/03/20 1200 03/03/20 2022 03/04/20 0000 03/04/20 0400  BP: (!) 146/84   (!) 141/72  Pulse: 73   80  Resp: (!) 25   18  Temp: (!) 97.3 F (36.3 C) (!) 97.5 F (36.4 C) (!) 97.5 F (36.4 C)   TempSrc: Oral Oral Oral Oral  SpO2: 95%     Weight:      Height:        Intake/Output Summary (Last 24 hours) at 03/04/2020 0745 Last data filed at 03/04/2020 0500 Gross per 24 hour  Intake 960 ml  Output 1850 ml  Net -890 ml   Last 3 Weights 03/02/2020 03/01/2020 02/29/2020  Weight (lbs) 189 lb 13.1 oz 224 lb 13.9 oz 211 lb 10.3 oz  Weight (kg) 86.1 kg 102 kg 96 kg      Telemetry    Normal sinus rhythm with continued runs of accelerated idioventricular rhythm,  longest run about 2 minutes. Rates mostly in the 60's to 90's. - Personally Reviewed  ECG    EKG on 01/01/21 showed normal sinus rhythm, rate 79 bpm, with Q waves in V1-V2 and slight ST depression in V4-V6 and lead II. Unchanged from tracing on 1/11. QTc markedly prolonged at 582 ms (new from admission). - Personally Reviewed  Physical Exam   GEN: No acute distress.   Neck: No JVD Cardiac: RRR, no murmurs, rubs, or gallops.  Respiratory: Clear to auscultation bilaterally. GI: Soft, nontender, non-distended  MS: No edema; No deformity. Neuro:  Nonfocal  Psych: Normal affect   Labs    High Sensitivity Troponin:   Recent Labs  Lab 02/24/20 0432 02/24/20 1124 02/24/20 1803 02/24/20 2000 03/01/20 0624  TROPONINIHS 12,216* 15,553* 12,157* 9,517* 942*      Chemistry Recent Labs  Lab 02/27/20 0145 02/28/20 0512 03/01/20 0624 03/01/20 2006 03/02/20 0446 03/03/20 0610 03/04/20 0600  NA 134*   < > 138   < > 142 139 138  K 3.2*   < > 3.3*   < > 3.3* 3.2* 3.9  CL 103   < > 108   < >  102 100 103  CO2 17*   < > 21*   < > 27 29 25   GLUCOSE 199*   < > 276*   < > 158* 168* 167*  BUN 19   < > 13   < > 8 6 6   CREATININE 0.72   < > 0.84   < > 0.78 0.80 0.78  CALCIUM 7.4*   < > 7.5*   < > 7.7* 8.2* 8.6*  PROT 5.4*  --  5.8*  --   --  6.6  --   ALBUMIN 2.5*  --  2.3*  --   --  2.6*  --   AST 320*  --  46*  --   --  39  --   ALT 536*  --  167*  --   --  111*  --   ALKPHOS 57  --  75  --   --  77  --   BILITOT 2.1*  --  1.1  --   --  1.2  --   GFRNONAA >60   < > >60   < > >60 >60 >60  ANIONGAP 14   < > 9   < > 13 10 10    < > = values in this interval not displayed.     Hematology Recent Labs  Lab 02/29/20 1747 03/01/20 0624 03/03/20 0610  WBC 11.0* 11.6* 7.9  RBC 3.12* 3.34* 3.81*  HGB 9.3* 10.0* 11.3*  HCT 28.9* 30.7* 35.8*  MCV 92.6 91.9 94.0  MCH 29.8 29.9 29.7  MCHC 32.2 32.6 31.6  RDW 17.9* 18.1* 17.4*  PLT 135* 167 234    BNPNo results for input(s): BNP,  PROBNP in the last 168 hours.   DDimer No results for input(s): DDIMER in the last 168 hours.   Radiology    DG Swallowing Func-Speech Pathology  Result Date: 03/02/2020 Objective Swallowing Evaluation: Type of Study: Bedside Swallow Evaluation  Patient Details Name: Savoy Somerville MRN: [id number] Date of Birth: 1960-06-07 Today's Date: 03/02/2020 Time: SLP Start Time (ACUTE ONLY): 1455 -SLP Stop Time (ACUTE ONLY): 2482 SLP Time Calculation (min) (ACUTE ONLY): 40 min Past Medical History: Past Medical History: Diagnosis Date . Diabetes mellitus without complication ([place])  . Hypertension  Past Surgical History: Past Surgical History: Procedure Laterality Date . COLONOSCOPY WITH PROPOFOL Left 02/27/2020  Procedure: COLONOSCOPY WITH PROPOFOL;  Surgeon: Otis Brace, MD;  Location: [facility];  Service: Gastroenterology;  Laterality: Left; . ESOPHAGOGASTRODUODENOSCOPY (EGD) WITH PROPOFOL N/A 02/27/2020  Procedure: ESOPHAGOGASTRODUODENOSCOPY (EGD) WITH PROPOFOL;  Surgeon: Otis Brace, MD;  Location: [facility];  Service: Gastroenterology;  Laterality: N/A; HPI: pt is a 60 yo male adm with GI bleed.  Pt underwent endoscopy and after had AMS, ? seizure activity with cardiac arrest requiring cardioversion and CPR.  Intubated 1/5 to 03/01/2020. Swallow eval ordered.  Pt also has h/o DM.  His CXR showed multifocal pna vs edema.  He is currently on room air.  Pt admits to some issues with swallowing prior to admit but does not expand on information.  Subjective: pt awake in chair Assessment / Plan / Recommendation CHL IP CLINICAL IMPRESSIONS 03/02/2020 Clinical Impression Min oral and mild pharyngeal dysphagia present likely due to potential edema from pt's prolonged intubation.  He demonstrates retention of secretions in pharynx without sensation.  Mildly impaired tongue base retraction, laryngeal elevation/closure allows laryngeal penetration of liquids and vallecular retention.  Pt denies sensation to retention  however will frequently propel vallecular residual into oral cavity  and reswallow.  No aspiration but pt demonstrates laryngeal penetration to cords with thin.  Cued cough/throat clearing removed trace penetration.  Pharyngeal retention worse with solid than liquids - thus recommend start with full liquid diet with strict precautions.  SlP will follow up next date for tolerance and readinesss for dietary advancement.  Using teach back with live feed, pt educated to recommendations and precautions.  Of note, pt did NOT cough during the MBS, but overtly coughed and expectorated viscous secretions *minimal blood tinged* after swallowing water.  Anticipate acute dysphagia to improve with decreased pharyngeal edema and conditioning. SLP Visit Diagnosis Dysphagia, pharyngeal phase (R13.13);Dysphagia, pharyngoesophageal phase (R13.14) Attention and concentration deficit following -- Frontal lobe and executive function deficit following -- Impact on safety and function Mild aspiration risk   CHL IP TREATMENT RECOMMENDATION 03/02/2020 Treatment Recommendations Therapy as outlined in treatment plan below   Prognosis 03/02/2020 Prognosis for Safe Diet Advancement Fair Barriers to Reach Goals -- Barriers/Prognosis Comment -- CHL IP DIET RECOMMENDATION 03/02/2020 SLP Diet Recommendations Thin liquid Liquid Administration via -- Medication Administration Whole meds with puree Compensations Slow rate;Small sips/bites;Other (Comment) Postural Changes --   CHL IP OTHER RECOMMENDATIONS 03/02/2020 Recommended Consults -- Oral Care Recommendations Oral care QID Other Recommendations --   CHL IP FOLLOW UP RECOMMENDATIONS 03/02/2020 Follow up Recommendations (No Data)   CHL IP FREQUENCY AND DURATION 03/02/2020 Speech Therapy Frequency (ACUTE ONLY) min 1 x/week Treatment Duration 1 week      CHL IP ORAL PHASE 03/02/2020 Oral Phase Impaired Oral - Pudding Teaspoon -- Oral - Pudding Cup -- Oral - Honey Teaspoon -- Oral - Honey Cup -- Oral - Nectar  Teaspoon -- Oral - Nectar Cup WFL Oral - Nectar Straw WFL Oral - Thin Teaspoon WFL Oral - Thin Cup WFL Oral - Thin Straw WFL Oral - Puree Lingual pumping Oral - Mech Soft Lingual pumping Oral - Regular -- Oral - Multi-Consistency -- Oral - Pill -- Oral Phase - Comment pt propels boluses from pharynx/pharyngeal tongue base into oral cavity and re-swallows  CHL IP PHARYNGEAL PHASE 03/02/2020 Pharyngeal Phase Impaired Pharyngeal- Pudding Teaspoon -- Pharyngeal -- Pharyngeal- Pudding Cup -- Pharyngeal -- Pharyngeal- Honey Teaspoon -- Pharyngeal -- Pharyngeal- Honey Cup -- Pharyngeal -- Pharyngeal- Nectar Teaspoon Reduced tongue base retraction;Reduced epiglottic inversion;Reduced laryngeal elevation Pharyngeal Material does not enter airway Pharyngeal- Nectar Cup -- Pharyngeal -- Pharyngeal- Nectar Straw Reduced tongue base retraction;Penetration/Aspiration during swallow;Reduced epiglottic inversion;Reduced laryngeal elevation Pharyngeal Material enters airway, remains ABOVE vocal cords and not ejected out Pharyngeal- Thin Teaspoon Reduced tongue base retraction;Reduced epiglottic inversion Pharyngeal Material enters airway, remains ABOVE vocal cords and not ejected out Pharyngeal- Thin Cup Reduced epiglottic inversion;Reduced laryngeal elevation;Reduced airway/laryngeal closure;Reduced tongue base retraction;Penetration/Aspiration during swallow;Penetration/Apiration after swallow Pharyngeal Material enters airway, CONTACTS cords and not ejected out Pharyngeal- Thin Straw Reduced epiglottic inversion;Reduced laryngeal elevation Pharyngeal Material enters airway, passes BELOW cords without attempt by patient to eject out (silent aspiration) Pharyngeal- Puree Reduced epiglottic inversion;Reduced tongue base retraction;Pharyngeal residue - valleculae;Reduced laryngeal elevation Pharyngeal Material does not enter airway Pharyngeal- Mechanical Soft Reduced epiglottic inversion;Reduced laryngeal elevation;Reduced tongue base  retraction;Pharyngeal residue - valleculae Pharyngeal Material does not enter airway Pharyngeal- Regular -- Pharyngeal -- Pharyngeal- Multi-consistency -- Pharyngeal -- Pharyngeal- Pill WFL Pharyngeal Material does not enter airway Pharyngeal Comment Inconsistent strength of swallow - pharyngeal clearance - Cues for effortful swallow did not contribute to prevention of retention (approx 30% retained of pudding bolus alone) but tablet with puree (whole) easily cleared (100%).  Pt does not sense retention in pharynx however he will propel vallecular retention into oral cavity and re-swallow *without awareness. Chin down posture not helpful and SlP did not head turn due to appearance of approx one inch of IJ line out of neck. SLP phoned RN regarding concern - covered bandage with transpore and proceeded with testing as advised per RN. Liquid was appears helpful to decrease retention.  CHL IP CERVICAL ESOPHAGEAL PHASE 03/02/2020 Cervical Esophageal Phase WFL Pudding Teaspoon -- Pudding Cup -- Honey Teaspoon -- Honey Cup -- Nectar Teaspoon -- Nectar Cup -- Nectar Straw -- Thin Teaspoon -- Thin Cup -- Thin Straw -- Puree -- Mechanical Soft -- Regular -- Multi-consistency -- Pill -- Cervical Esophageal Comment -- Kathleen Lime, MS [facility] SLP Acute Rehab Services Office [phone] Pager [phone] Macario Golds 03/02/2020, 4:12 PM               Cardiac Studies   Echocardiogram 02/25/2020: Impressions: 1. LVEF 50-55% with wall motion abnormalities suspicious for mid LAD. No  apical thrombus.  2. Left ventricular ejection fraction, by estimation, is 50 to 55%. The  left ventricle has low normal function. The left ventricle demonstrates  regional wall motion abnormalities (see scoring diagram/findings for  description). Left ventricular diastolic  parameters are consistent with Grade II diastolic dysfunction  (pseudonormalization). Elevated left atrial pressure.  3. Right ventricular systolic function is normal.  The right ventricular  size is normal. There is moderately elevated pulmonary artery systolic  pressure. The estimated right ventricular systolic pressure is 62.6 mmHg.  4. Left atrial size was mildly dilated.  5. The mitral valve is normal in structure. Moderate mitral valve  regurgitation. No evidence of mitral stenosis.  6. The aortic valve is normal in structure. Aortic valve regurgitation is  not visualized. Mild to moderate aortic valve sclerosis/calcification is  present, without any evidence of aortic stenosis.  7. The inferior vena cava is normal in size with greater than 50%  respiratory variability, suggesting right atrial pressure of 3 mmHg.    Diagnostic cath 03/04/20 Dominance: Right      Patient Profile     Mr. Kenney is a 60 y.o.malewith a history of hypertension and type 2 diabetes but no known cardiac history who was admitted on 02/22/2020 for further evaluation of hematochezia. On 02/23/2020, patient had a chest pain episode with blood bowel movement. Chest pain resolved with Nitro. However, he then had recurrent chest pain and actually had a VT arrest. Received 2-5 minutes of CPR and was cardioverted. Post-resuscitation rhythm was trial fibrillation. Cardiology was consulted for further evaluation/management of VF arrest and atrial fibrillation.  Assessment & Plan    VT Arrest NSTEMI - Patient had VT arrest on 1/4 in setting of ongoing GI bleeding. ROSC obtained after about 2-5 minutes of CPR and cardioversion.  - High-sensitivity troponin peaked at >15,000 on 1/5 and has trended down. - EKG shows Q waves in V1-V2 and as well as slight ST depression in V4-V6 and lead II.  - Echo showed LVEF of 50-55% with hypokinesis of mid and distal anterior wall, mid and distal anterior septum, and apex as well as grade 2 diastolic dysfunction. Moderate MR and moderately elevated PASP of 48.7 mmHg also noted. - IV Heparin was started on 1/6 without bolus after clearance with  GI. However, he had recurrent bleeding midnight on 1/8. Therefore, Heparin was stopped. -Patient denies any chest pain.  - Continue Aspirin.  - Currently Lopressor 25mg  twice daily. May need  to increase to 50mg  twice daily after cath. - Home statin on hold due to elevated LFTs. Can restart once these normalize. -Plan is for cardiac catheterization today. Procedure discussed and patient consented yesterday.   Possible Cardiogenic Shock - COOX in the 50's on 1/5 consistent with cardiogenic shock but was 97 on last check on 1/6.  - Patient was started on pressors after VT arrest but these have all been weaned off. Has not been on any pressors since 1/8. - Patient now being diuresed with IV Lasix with good response. Documented urinary output of 1.8 L yesterday but net positive 1.7 L this admission. No documented weight the last 2 days but weight was down 5 lbs from admission on 1/12. Renal function stable. - Continue IV Lasix for now. May be able to transition to PO soon.  - Continue to monitor daily weight, strict I/O's, and renal function.  Acute Hypoxic Respiratory Failure Post Arrest Requiring Intubation Suspect Aspiration due to Cardiac Arrest Acute Pulmonary Edema - Extubatedon 1/11.O2 sats now in the high 90's on room air. - Was treated with antibiotics.  - Diuresis as above.  - Management per primary team.  Atrial Fibrillation with RVR - Post arrest rhythm with atrial fibrillation with RVR. However, he then converted to sinus rhythm.  - Maintaining sinus rhythm with continued runs of AIVR. - Continue Lopressor as above. - Not an anticoagulation candidate with ongoing GI bleed.  Accelerated Idioventricular Rhythm Overnight Prolonged QTc - Patient was noted to have multiple runs of accelerated idioventricular rhythm overnight on 1/12 to 1/13. Continues to have runs of AIVR (longest run about 2 minutes). - EKG on 1/13 showed QTc of 582 ms.  - Potassium 3.9 today. Keep > 3.9.   - Magnesium 2.1 today. Keep > 1.9.  - Keppra and Zofran have been discontinued. Pharmacy has been asked to review medications to exclude any other QT prolonging medications.  - Will repeat EKG today.  - Continue Lopressor as above. May need to increase dose after cath. - EP will see when patient goes to [facility] today.  Hypertension - BP elevated. Systolic BP in the 409'W to 160's. - Continue Lopressor 25mg  twice daily. - Home Losartan-HCTZ and Amlodipine currently on hold. Can likely restarted after cardiac catheterization.  Hyperlipidemia - Home Lipitor on hold for now given elevated LFTs. - Can restart when LFTs normalize (should be soon). Can recheck LFTs tomorrow.   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 Felt to be from diverticular bleed. - EGD on 1/8 negative for bleeding or ulcer.  - Hemoglobin as low as 7.2 this admission. S/p a total of 7 units of PRBCs (last received 4 units on 1/8) Stable at 11.3 yesterday.  - No recurrent bleeding.  - Management per primary team and GI.  Hypokalemia - Potassium 3.9 this morning. Keep > 3.9 given prolonged QTc. Being repleted by primary team. - Continue to monitor closely with diuresis.  Otherwise, per primary team:  For questions or updates, please contact [place] Please consult [email or web address] for contact info under        Signed, Darreld Mclean, PA-C  03/04/2020, 7:45 AM    History and all data above reviewed.  Patient examined.  I agree with the findings as above.  Now post cath with results as above .  TCTS has been consulted.  The patient exam reveals COR:RRR  ,  Lungs: Clear  ,  Abd: Positive bowel sounds, no rebound no guarding, Ext Right radial pressure band in place  .  All available labs, radiology testing, previous records reviewed. Agree with documented assessment and plan. CAD:  Three  vessel CAD.  Surgery consulted.  Prolonged QT:  Needs repeat EKG today.  Stopped any agents that could have caused this.  It does seem like the cardiac event might have been a primary arrhythmic event with TdP precipitated by an acquired long QT.  We have supplemented mag and potassium.  (He received 120 meq yesterday and is scheduled to get 80 meq today.)  EP is to see as well.    Leveda Kendrix  10:30 AM  03/04/2020

## 2020-03-04 NOTE — Consult Note (Signed)
Cardiology Consultation:   Patient ID: Colton Guzman MRN: [id number]; DOB: 1960/07/04  Admit date: 02/22/2020 Date of Consult: 03/04/2020  Primary Care Provider: London Pepper, MD [facility] Cardiologist: Freada Bergeron, MD  [facility] Electrophysiologist:  None    Patient Profile:   Colton Guzman is a 60 y.o. male with a hx of HTN, DM who is being seen today for the evaluation of prolonged QT at the request of Dr. Warren Lacy.  History of Present Illness:   [name] admitted to [facility] 02/22/20 with painless hematochezia, seen by GI felt likely infectious colitis and plan for colonoscopy after resolution of DKA. On 1/4 evening, pt had episode of chest pain episode with bloody BM. CP resolved with SL nitro x 1. However had recurrently episode of chest pain and went into VT arrest. 2-5 minutes of CPR and shock.  Post resuscitation rhythm was afib. Hs-troponin peaked at 15553 then treaded down.  Treated with pressors for possible cardiogenic shock, intubated, cardiology consulted noted dynamic EKG changes. Not an a/c given GIB Planned to pursue ischemic w/u once medically able. LVEF 50-55% w/WMA, RV OK Early noted with seizure activity and fever, started on Keppra Did get GI clearance for heparin gtt, suspect infectious colitis Recurrent bleeding required stop of heparin >> 2uPRBC EGD 1/8 negative for bleeding or ulcer - Colonoscopy 1/8 with rectal bleeding and multiple clots. Felt likely due to diverticular bleed.  Cards noted development of QT prolongation particularly 1/11 K+ was 3.3, noted prolonged Qtc and ventricular escape improved with decrease of Precedex  HS trop trending down, (peak earlier in stay was 15553)  Extubated 03/01/20 Follow up EKG QTc was improved, off pressors and started on BB with plans to avoid QT prolonging agents. yesterday's cars noted that overnight had frequent runs of accelerated idioventricular rhythm as long as about a minute in duration Has  been diuresed, has had total of 7u PRBC Intermittent hypokalemia  EP service was asked to weigh in on his QT prolongation given not noted at admission/baseline. 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  Dr. Percival Spanish notes,  does seem like the cardiac event might have been a primary arrhythmic event with TdP precipitated by an acquired long QT.  LABS today K+ 3.9 Mag 2.1 BUN/Creat 6/0.78  Yesterday Wbc 7.9 H/h 11/35  (lowest was 7.2) Plts 234  Admission  Glucose 400'S-500'S K+ 4.2 >>> night of arrest 3.0  Mag was OK   The patient has no ongoing CP, no belly pain, no SOB. He does say that he had been feeling a heaviness in his chest with increased exertion in the last few months. No syncope.  His dad died suddenly collapsed and died at 107, he recalls being told heart disease, no specifics His mom died after a stroke at 60   Past Medical History:  Diagnosis Date  . Diabetes mellitus without complication ([place])   . Hypertension     Past Surgical History:  Procedure Laterality Date  . COLONOSCOPY WITH PROPOFOL Left 02/27/2020   Procedure: COLONOSCOPY WITH PROPOFOL;  Surgeon: Otis Brace, MD;  Location: [facility];  Service: Gastroenterology;  Laterality: Left;  . ESOPHAGOGASTRODUODENOSCOPY (EGD) WITH PROPOFOL N/A 02/27/2020   Procedure: ESOPHAGOGASTRODUODENOSCOPY (EGD) WITH PROPOFOL;  Surgeon: Otis Brace, MD;  Location: [facility];  Service: Gastroenterology;  Laterality: N/A;     Home Medications:  Prior to Admission medications  Medication Sig Start Date End Date Taking? Authorizing Provider  allopurinol (ZYLOPRIM) 100 MG tablet Take 100 mg by mouth daily.   Yes [provider]  amLODipine (NORVASC) 10 MG tablet Take 10 mg by mouth daily.   Yes [provider]  Ascorbic Acid (VITAMIN C) 1000 MG tablet Take 1,000 mg by mouth  daily.   Yes [provider]  aspirin EC 81 MG tablet Take 81 mg by mouth daily. Swallow whole.   Yes [provider]  atorvastatin (LIPITOR) 40 MG tablet Take 40 mg by mouth daily. 01/05/20  Yes [provider]  glyBURIDE (DIABETA) 5 MG tablet Take 5 mg by mouth 2 (two) times daily with a meal.   Yes [provider]  losartan-hydrochlorothiazide (HYZAAR) 100-25 MG tablet Take 1 tablet by mouth daily. 01/05/20  Yes [provider]  Magnesium 300 MG CAPS Take 300 mg by mouth daily.   Yes [provider]  metFORMIN (GLUCOPHAGE) 1000 MG tablet Take 1,000 mg by mouth 2 (two) times daily with a meal.   Yes [provider]  metoprolol tartrate (LOPRESSOR) 25 MG tablet Take 25 mg by mouth 2 (two) times daily. 01/05/20  Yes [provider]  Multiple Vitamin (MULTIVITAMIN WITH MINERALS) TABS tablet Take 1 tablet by mouth daily.   Yes [provider]  Vitamin D, Ergocalciferol, (DRISDOL) 1.25 MG (50000 UNIT) CAPS capsule Take 50,000 Units by mouth every 7 (seven) days.   Yes [provider]    Inpatient Medications: Scheduled Meds: . [MAR Hold] aspirin  81 mg Oral Daily  . [MAR Hold] Chlorhexidine Gluconate Cloth  6 each Topical Q0600  . [MAR Hold] docusate  100 mg Per Tube BID  . [MAR Hold] furosemide  40 mg Intravenous Daily  . [MAR Hold] insulin aspart  0-9 Units Subcutaneous Q4H  . [MAR Hold] insulin glargine  10 Units Subcutaneous BID  . [MAR Hold] magnesium oxide  400 mg Oral BID  . [MAR Hold] metoprolol tartrate  25 mg Oral BID  . [MAR Hold] mupirocin ointment  1 application Nasal BID  . [MAR Hold] pantoprazole  40 mg Oral Q1200  . [MAR Hold] polyethylene glycol  17 g Per Tube Daily  . [MAR Hold] potassium chloride  40 mEq Oral BID  . [MAR Hold] sodium chloride flush  10-40 mL Intracatheter Q12H  . sodium chloride flush  3 mL Intravenous Q12H   Continuous Infusions: . [MAR Hold] sodium chloride 10  mL/hr at 03/02/20 1127  . sodium chloride    . sodium chloride 10 mL/hr at 03/04/20 0647  . sodium chloride 75 mL/hr at 03/04/20 0943   PRN Meds: [MAR Hold] sodium chloride, sodium chloride, [MAR Hold] alum & mag hydroxide-simeth, [MAR Hold] nitroGLYCERIN, [MAR Hold] phenol, [MAR Hold] sodium chloride flush, sodium chloride flush  Allergies:   No Known Allergies  Social History:   Social History   Socioeconomic History  . Marital status: Married    Spouse name: Not on file  . Number of children: Not on file  . Years of education: Not on file  . Highest education level: Not on file  Occupational History  . Not on file  Tobacco Use  . Smoking status: Never Smoker  . Smokeless tobacco: Never Used  Substance and Sexual Activity  . Alcohol use: Yes  . Drug use: Not on file  . Sexual activity: Not on file  Other Topics Concern  . Not on file  Social History Narrative  .  Not on file   Social Determinants of Health   Financial Resource Strain: Not on file  Food Insecurity: Not on file  Transportation Needs: Not on file  Physical Activity: Not on file  Stress: Not on file  Social Connections: Not on file  Intimate Partner Violence: Not on file    Family History:   As above  ROS:  Please see the history of present illness.  All other ROS reviewed and negative.     Physical Exam/Data:   Vitals:   03/04/20 1030 03/04/20 1035 03/04/20 1040 03/04/20 1045  BP: (!) 152/80 (!) 148/74 (!) 154/73 (!) 153/66  Pulse: 68 70 70 67  Resp: (!) 22 (!) 21 20 20   Temp:      TempSrc:      SpO2: 97% 97% 99% 99%  Weight:      Height:        Intake/Output Summary (Last 24 hours) at 03/04/2020 1050 Last data filed at 03/04/2020 0500 Gross per 24 hour  Intake 360 ml  Output 1450 ml  Net -1090 ml   Last 3 Weights 03/02/2020 03/01/2020 02/29/2020  Weight (lbs) 189 lb 13.1 oz 224 lb 13.9 oz 211 lb 10.3 oz  Weight (kg) 86.1 kg 102 kg 96 kg     Body mass index is 28.86 kg/m.   General:  Well nourished, well developed, in no acute distress HEENT: normal Lymph: no adenopathy Neck: no JVD Endocrine:  No thryomegaly Vascular: No carotid bruits Cardiac:  RRR; no murmurs, gallops or rubs Lungs:  CTA b/l, no wheezing, rhonchi or rales  Abd: soft, nontender  Ext: no edema Musculoskeletal:  No deformities Skin: warm and dry  Neuro:  No gross focal motor abnormalities noted Psych:  Normal affect   EKG:  The EKG was personally reviewed and demonstrates:   02/22/20 ST 114, QS V1-2, baseline motion, QTc 458ms 02/23/20 0716, ST 120bpm, PVC, no acute looking changes 19:53 is SR 98bpm, marked ST depressions inf/lat Arrest 02/23/20 @ 20:09 20:31 ST, PAS 123, persistent ST depressions, elevation aVR 21:07 AFib 170bpm, cont ST changes 02/24/20 AFib 111, resolved ST changes, QTc 432ms 01:47 AFib 111, QTc 465 12:53 SR 81, QTc 522 03/01/20 SB 55, QTc 491, visually is long  > measured 560 > QTc 536 Extubated 03/03/20 SR 79bpm, QTc 573ms, has U wave as well.   Telemetry:  Telemetry was personally reviewed and demonstrates:    Tele strips in epic reviewed, 1/11 and yesterday with accel idioventricular rhythm, 60's Onset of his arrest was PMVT proceeded by St depressions   Relevant CV Studies:   03/04/20; LHC  Prox RCA lesion is 70% stenosed.  Dist RCA lesion is 50% stenosed.  RPDA lesion is 70% stenosed.  3rd RPL lesion is 99% stenosed.  2nd RPL lesion is 99% stenosed.  Mid LAD lesion is 90% stenosed.  Ost LAD to Prox LAD lesion is 70% stenosed.  Ost Cx to Prox Cx lesion is 100% stenosed.  Mid LM to Ost LAD lesion is 60% stenosed.   1. Severe proximal LAD stenosis. Severe mid LAD stenosis.  2. Complete occlusion of the proximal Circumflex with filling by right to left and left to left collaterals.  3. The RCA is a large dominant vessel with severe proximal stenosis, moderate distal stenosis. Severe stenosis in the posterolateral artery and the PDA.  4. LVEDP  14  Recommendations: He has severe three vessel CAD. Will keep at [facility] and ask CT surgery to consult for CABG.  Echo 02/25/2020 1. LVEF 50-55% with wall motion abnormalities suspicious for mid LAD. No  apical thrombus.  2. Left ventricular ejection fraction, by estimation, is 50 to 55%. The  left ventricle has low normal function. The left ventricle demonstrates  regional wall motion abnormalities (see scoring diagram/findings for  description). Left ventricular diastolic  parameters are consistent with Grade II diastolic dysfunction  (pseudonormalization). Elevated left atrial pressure.  3. Right ventricular systolic function is normal. The right ventricular  size is normal. There is moderately elevated pulmonary artery systolic  pressure. The estimated right ventricular systolic pressure is 10.6 mmHg.  4. Left atrial size was mildly dilated.  5. The mitral valve is normal in structure. Moderate mitral valve  regurgitation. No evidence of mitral stenosis.  6. The aortic valve is normal in structure. Aortic valve regurgitation is  not visualized. Mild to moderate aortic valve sclerosis/calcification is  present, without any evidence of aortic stenosis.  7. The inferior vena cava is normal in size with greater than 50%  respiratory variability, suggesting right atrial pressure of 3 mmHg.   Laboratory Data:  High Sensitivity Troponin:   Recent Labs  Lab 02/24/20 0432 02/24/20 1124 02/24/20 1803 02/24/20 2000 03/01/20 0624  TROPONINIHS 12,216* 15,553* 12,157* 9,517* 942*     Chemistry Recent Labs  Lab 03/02/20 0446 03/03/20 0610 03/04/20 0600  NA 142 139 138  K 3.3* 3.2* 3.9  CL 102 100 103  CO2 27 29 25   GLUCOSE 158* 168* 167*  BUN 8 6 6   CREATININE 0.78 0.80 0.78  CALCIUM 7.7* 8.2* 8.6*  GFRNONAA >60 >60 >60  ANIONGAP 13 10 10     Recent Labs  Lab 02/27/20 0145 03/01/20 0624 03/03/20 0610  PROT 5.4* 5.8* 6.6  ALBUMIN 2.5* 2.3* 2.6*  AST 320* 46*  39  ALT 536* 167* 111*  ALKPHOS 57 75 77  BILITOT 2.1* 1.1 1.2   Hematology Recent Labs  Lab 02/29/20 1747 03/01/20 0624 03/03/20 0610  WBC 11.0* 11.6* 7.9  RBC 3.12* 3.34* 3.81*  HGB 9.3* 10.0* 11.3*  HCT 28.9* 30.7* 35.8*  MCV 92.6 91.9 94.0  MCH 29.8 29.9 29.7  MCHC 32.2 32.6 31.6  RDW 17.9* 18.1* 17.4*  PLT 135* 167 234   BNPNo results for input(s): BNP, PROBNP in the last 168 hours.  DDimer No results for input(s): DDIMER in the last 168 hours.   Radiology/Studies:  CT HEAD WO CONTRAST Result Date: 03/01/2020 CLINICAL DATA:  60 year old male with altered mental status. New posturing. Recent respiratory failure, GI bleed, cardiac arrest EXAM: CT HEAD WITHOUT CONTRAST TECHNIQUE: Contiguous axial images were obtained from the base of the skull through the vertex without intravenous contrast. COMPARISON:  Head CT 02/25/2020. FINDINGS: Brain: No midline shift, mass effect, or evidence of intracranial mass lesion. No ventriculomegaly No acute intracranial hemorrhage identified. Gray-white matter differentiation appears stable throughout the brain and within normal limits. No cerebral edema is evident. No cortically based acute infarct identified. Faint basal ganglia dystrophic calcifications more apparent on the left are stable. Vascular: Extensive Calcified atherosclerosis at the skull base. No suspicious intracranial vascular hyperdensity. Skull: No acute osseous abnormality identified. Sinuses/Orbits: Fairly good sinus and mastoid aeration in the setting of intubation. Mostly resolved fluid in the visible pharynx. Other: No acute orbit or scalp soft tissue finding. IMPRESSION: No acute intracranial abnormality. Stable since 02/25/2020 and largely negative non contrast CT appearance of the brain. Electronically Signed   By: Genevie Ann M.D.   On: 03/01/2020 06:12  Assessment and Plan:   1. QT prolongation      Developed post VF arrest     Intermittent hypokalemia     Keppra,  zofran, amio       Suspect ischemic driven > reperfusion  Treated with amiodarone > started 1/4 > stopped 02/26/20 Agree to keep electrolytes replaced and avoid QT prolonging drugs Pending CTS evaluation for CABG  Dr. Quentin Ore to see later today {  For questions or updates, please contact [facility] Please consult [email or web address] for contact info under    Signed, Baldwin Jamaica, PA-C  03/04/2020 10:50 AM

## 2020-03-04 NOTE — Progress Notes (Signed)
Per patient wife patient was having left sided CP, when RN ask patient about the CP, he said he think he pull a muscle and rates pain as zero. PA Barrett with CARDs paged with above.She wants RNs to make them aware ASAP if patient starts to complain of CP.

## 2020-03-04 NOTE — Progress Notes (Signed)
PROGRESS NOTE  Colton Guzman BSJ:[id number] DOB: 1960-06-21 DOA: 02/22/2020 PCP: London Pepper, MD   LOS: 11 days   Brief narrative:  60 year old male with past medical history of hypertension, diabetes mellitus was admitted to the hospital on 02/22/20 with painless hematochezia . CT abdomen with angiogram he was performed on the same day which did not show any active bleeding. He did have ongoing bright red bleeding. Patient had belching, chest discomfort and flatus the next day and had VT arrest with 2 min CPR with ROSC.   Initial labs were consistent with evolving hyperglycemia and metabolic acidosis. Patient was also hypotensive and required vasopressors for cardiogenic shock.    Sequence of events  02/23/20. Patient did have cardiac arrest and was intubated.  02/24/20 patient was put on vasopressors, cardiology was consulted. 1/06 PSV wean , off pressors 1/8 >  colonoscopy showed bright red blood, total 4 u prbc given. 1/10 -  remained on vent. Continued TF.  Assessment/Plan:  Principal Problem:   Acute lower GI bleeding Active Problems:   Acute blood loss anemia   T2DM (type 2 diabetes mellitus) ([place])   Hypertension   Hyperlipidemia   Gout   Acute respiratory failure with hypoxia (HCC)   Cardiac arrest (HCC)   Shock circulatory (HCC)   Fever   Status post ventricular tachycardia/ cardiac arrest 1/4  Patient also had atrial fibrillation with RVR.  Patient had [place] on 03/03/20.  Electrophysiology on board.   Currently on aspirin and PPI. Cardiology planning for diagnostic cath.  Keep potassium more than 4 and magnesium more than 2 .  Metoprolol dose has been increased to 50 mg twice daily.  Acute hypoxic respiratory failure and mechanical ventilation post-arrest Suspected Aspiration due to Cardiac Arrest /Acute Pulmonary Edema   Status post extubation.  Resolved.  On room air  Volume overload Patient had significant volume overload and has been started on IV Lasix with  potassium. Closely monitor BMP.  Patient is positive for 1725mL at this time.  Has lost significant weight with diuresis.  Questionable seizure activity vs myoclonus s/p arrest   EEG 02/26/20 was negative for seizures. CT head scan was negative for acute findings. Patient was continued on Keppra empirically.  Keppra has been discontinued at this time due to QTC prolongation.   Fever  Resolved after removing central catheter.  Catheter tip culture pending. Marland Kitchen MRSA PCR negative.  Off antibiotic at this time.  Closely monitor clinically.  WBC has improved to 7.9.  Procalcitonin is negative   Acute GI bleed, hematochezia - Had VT arrest on 1/4 in setting of ongoing GIB. Started on heparin gtt1/6 eveningwithout bolus after clearance from GI. However, had recurrent bleeding midnight on 1/8. Stopped heparin on 1/8. Received PRBC transfusion. We will transfuse as necessary for hemoglobin less than 7. CT angiogram on 02/22/2020 with mild thickening of descending colon. Patient underwent upper and lower endoscopy on 02/27/2020. Possibility of diverticular bleed as per GI.  hemoglobin was 11.3 yesterday.  Hypokalemia. Will continue to replenish.  Currently getting p.o. potassium twice daily.   0.3.  Latest potassium was 3.9.  To keep potassium more than 3.9.  Diabetes Mellitus 2 with DKA/HHS Patient initially was on insulin drip. Has been transitioned to Lantus, sliding scale insulin. Patient is on metformin and glyburide at home.  Latest POC glucose of 164  Essential hypertension Currently on metoprolol  Hyperlipidemia atorvastatin on hold.   DVT prophylaxis: SCDs Start: 02/22/20 1838  Code Status: Full code  Family Communication:  None today.  Status is: Inpatient  Remains inpatient appropriate because:IV treatments appropriate due to intensity of illness or inability to take PO and Inpatient level of care appropriate due to severity of illness, arrhythmia, plan for cardiac cath  today.  Dispo: The patient is from: Home              Anticipated d/c is to: CIR as per PT evaluation.              Anticipated d/c date is: 2 days              Patient currently is not medically stable to d/c.  Consultants:  PCCM  Cardiology  GI  Electrophysiology cardiology  Procedures:  Intubation and mechanical ventilation  PRBC transfusion  Anti-infectives: None currently  Anti-infectives (From admission, onward)   Start     Dose/Rate Route Frequency Ordered Stop   02/25/20 2200  vancomycin (VANCOREADY) IVPB 1250 mg/250 mL  Status:  Discontinued        1,250 mg 166.7 mL/hr over 90 Minutes Intravenous Every 12 hours 02/25/20 1528 02/28/20 0857   02/25/20 2200  piperacillin-tazobactam (ZOSYN) IVPB 3.375 g  Status:  Discontinued        3.375 g 12.5 mL/hr over 240 Minutes Intravenous Every 8 hours 02/25/20 1528 03/02/20 1047   02/25/20 1600  vancomycin (VANCOCIN) IVPB 1000 mg/200 mL premix        1,000 mg 200 mL/hr over 60 Minutes Intravenous  Once 02/25/20 1528 02/25/20 1800   02/24/20 0000  piperacillin-tazobactam (ZOSYN) IVPB 3.375 g  Status:  Discontinued        3.375 g 100 mL/hr over 30 Minutes Intravenous Every 6 hours 02/23/20 2216 02/23/20 2219   02/23/20 2300  piperacillin-tazobactam (ZOSYN) IVPB 3.375 g  Status:  Discontinued        3.375 g 12.5 mL/hr over 240 Minutes Intravenous Every 8 hours 02/23/20 2220 02/25/20 1528   02/23/20 1200  Ampicillin-Sulbactam (UNASYN) 3 g in sodium chloride 0.9 % 100 mL IVPB  Status:  Discontinued        3 g 200 mL/hr over 30 Minutes Intravenous Every 6 hours 02/23/20 1102 02/23/20 2216     Subjective: Today, patient was seen and examined at bedside .  Denies chest pain shortness of breath  Objective: Vitals:   03/04/20 0000 03/04/20 0400  BP:  (!) 141/72  Pulse:  80  Resp:  18  Temp: (!) 97.5 F (36.4 C)   SpO2:      Intake/Output Summary (Last 24 hours) at 03/04/2020 0730 Last data filed at 03/04/2020  0500 Gross per 24 hour  Intake 960 ml  Output 1850 ml  Net -890 ml   Filed Weights   02/29/20 0406 03/01/20 0500 03/02/20 0500  Weight: 96 kg 102 kg 86.1 kg   Body mass index is 28.86 kg/m.   Physical Exam:  GENERAL: Patient is alert awake and oriented. Not in obvious distress.  HENT: No scleral pallor or icterus. Pupils equally reactive to light. Oral mucosa is moist NECK: is supple, no gross swelling noted. CHEST:   Diminished breath sounds bilaterally.  No wheezes or crackles noted CVS: S1 and S2 heard, systolic murmur noted regular rate and rhythm.  ABDOMEN: Soft, non-tender, bowel sounds are present. EXTREMITIES: Trace lower extremity edema noted CNS: Cranial nerves are intact. No focal motor deficits. SKIN: warm and dry without rashes.  Data Review: I have personally reviewed the following laboratory data and studies,  CBC: Recent Labs  Lab 02/27/20 0145 02/27/20 0700 02/28/20 1650 02/28/20 2230 02/29/20 0230 02/29/20 1100 02/29/20 1747 03/01/20 0624 03/03/20 0610  WBC 12.1*   < > 9.5  --  11.7*  --  11.0* 11.6* 7.9  NEUTROABS 9.6*  --   --   --   --   --   --   --   --   HGB 7.2*   < > 8.6*   < > 9.8* 9.2* 9.3* 10.0* 11.3*  HCT 22.0*   < > 25.9*   < > 29.8* 27.8* 28.9* 30.7* 35.8*  MCV 94.8   < > 91.2  --  92.3  --  92.6 91.9 94.0  PLT 130*   < > 107*  --  126*  --  135* 167 234   < > = values in this interval not displayed.   Basic Metabolic Panel: Recent Labs  Lab 02/26/20 1753 02/27/20 0145 02/28/20 0512 03/01/20 0624 03/01/20 2006 03/02/20 0446 03/03/20 0610 03/04/20 0600  NA  --  134*   < > 138 142 142 139 138  K  --  3.2*   < > 3.3* 3.1* 3.3* 3.2* 3.9  CL  --  103   < > 108 102 102 100 103  CO2  --  17*   < > 21* 27 27 29 25   GLUCOSE  --  199*   < > 276* 143* 158* 168* 167*  BUN  --  19   < > 13 8 8 6 6   CREATININE  --  0.72   < > 0.84 0.72 0.78 0.80 0.78  CALCIUM  --  7.4*   < > 7.5* 7.4* 7.7* 8.2* 8.6*  MG 2.1 1.9  --  2.0  --  1.8 2.3  2.1  PHOS 2.0*  --   --  2.5  --  2.8 3.0 3.1   < > = values in this interval not displayed.   Liver Function Tests: Recent Labs  Lab 02/27/20 0145 03/01/20 0624 03/03/20 0610  AST 320* 46* 39  ALT 536* 167* 111*  ALKPHOS 57 75 77  BILITOT 2.1* 1.1 1.2  PROT 5.4* 5.8* 6.6  ALBUMIN 2.5* 2.3* 2.6*   No results for input(s): LIPASE, AMYLASE in the last 168 hours. No results for input(s): AMMONIA in the last 168 hours. Cardiac Enzymes: No results for input(s): CKTOTAL, CKMB, CKMBINDEX, TROPONINI in the last 168 hours. BNP (last 3 results) No results for input(s): BNP in the last 8760 hours.  ProBNP (last 3 results) No results for input(s): PROBNP in the last 8760 hours.  CBG: Recent Labs  Lab 03/03/20 1153 03/03/20 1611 03/03/20 2021 03/03/20 2357 03/04/20 0432  GLUCAP 268* 118* 204* 145* 164*   Recent Results (from the past 240 hour(s))  Culture, blood (routine x 2)     Status: None   Collection Time: 02/23/20  9:23 AM   Specimen: BLOOD RIGHT HAND  Result Value Ref Range Status   Specimen Description   Final    BLOOD RIGHT HAND Performed at [facility], [place] [address], [place], [place] [address]    Special Requests   Final    BOTTLES DRAWN AEROBIC ONLY Blood Culture adequate volume Performed at [place] [address] [place], [place] [address]    Culture   Final    NO GROWTH 5 DAYS Performed at [facility], [place] [address] [place], [place] [address]    Report Status 02/28/2020 FINAL  Final  Culture, respiratory (non-expectorated)     Status: None   Collection Time: 02/24/20  3:00 PM   Specimen: Tracheal Aspirate; Respiratory  Result Value Ref Range Status   Specimen Description   Final    TRACHEAL ASPIRATE Performed at [place] [address], [place], [place] [address]    Special Requests   Final    NONE Performed at [facility], [place] [address]  [place], [place] [address]    Gram Stain   Final    ABUNDANT WBC PRESENT, PREDOMINANTLY PMN RARE GRAM POSITIVE COCCI    Culture   Final    NO GROWTH 2 DAYS Performed at [facility], [place] [address] [place], [place] [address]    Report Status 02/27/2020 FINAL  Final  Culture, blood (Routine X 2) w Reflex to ID Panel     Status: None   Collection Time: 02/26/20  5:11 AM   Specimen: BLOOD RIGHT HAND  Result Value Ref Range Status   Specimen Description   Final    BLOOD RIGHT HAND Performed at [place] [address] [place], [place] [address]    Special Requests   Final    BOTTLES DRAWN AEROBIC ONLY Blood Culture results may not be optimal due to an inadequate volume of blood received in culture bottles Performed at [place] [address], [place], [place] [address]    Culture   Final    NO GROWTH 5 DAYS Performed at [facility], [place] [address], [place], [place] [address]    Report Status 03/02/2020 FINAL  Final  Culture, blood (Routine X 2) w Reflex to ID Panel     Status: None   Collection Time: 02/26/20  5:11 AM   Specimen: BLOOD  Result Value Ref Range Status   Specimen Description   Final    BLOOD LEFT ARM Performed at [place] [address], [place], [place] [address]    Special Requests   Final    BOTTLES DRAWN AEROBIC ONLY Blood Culture results may not be optimal due to an inadequate volume of blood received in culture bottles Performed at [place] [address], [place], [place] [address]    Culture   Final    NO GROWTH 5 DAYS Performed at [facility], [place] [address] [place], [place] [address]    Report Status 03/02/2020 FINAL  Final     Studies: DG Swallowing Func-Speech Pathology  Result Date: 03/02/2020 Objective Swallowing Evaluation: Type of Study: Bedside Swallow Evaluation  Patient Details Name: Nima Kemppainen MRN: [id number] Date of Birth:  [birth year] Today's Date: 03/02/2020 Time: SLP Start Time (ACUTE ONLY): 1455 -SLP Stop Time (ACUTE ONLY): 7591 SLP Time Calculation (min) (ACUTE ONLY): 40 min Past Medical History: Past Medical History: Diagnosis Date . Diabetes mellitus without complication ([place])  . Hypertension  Past Surgical History: Past Surgical History: Procedure Laterality Date . COLONOSCOPY WITH PROPOFOL Left 02/27/2020  Procedure: COLONOSCOPY WITH PROPOFOL;  Surgeon: Otis Brace, MD;  Location: [facility];  Service: Gastroenterology;  Laterality: Left; . ESOPHAGOGASTRODUODENOSCOPY (EGD) WITH PROPOFOL N/A 02/27/2020  Procedure: ESOPHAGOGASTRODUODENOSCOPY (EGD) WITH PROPOFOL;  Surgeon: Otis Brace, MD;  Location: [facility];  Service: Gastroenterology;  Laterality: N/A; HPI: pt is a 60 yo male adm with GI bleed.  Pt underwent endoscopy and after had AMS, ? seizure activity with cardiac arrest requiring cardioversion and CPR.  Intubated 1/5 to 03/01/2020.  Swallow eval ordered.  Pt also has h/o DM.  His CXR showed multifocal pna vs edema.  He is currently on room air.  Pt admits to some issues with swallowing prior to admit but does not expand on information.  Subjective: pt awake in chair Assessment / Plan / Recommendation CHL IP CLINICAL IMPRESSIONS 03/02/2020 Clinical Impression Min oral and mild pharyngeal dysphagia present likely due to potential edema from pt's prolonged intubation.  He demonstrates retention of secretions in pharynx without sensation.  Mildly impaired tongue base retraction, laryngeal elevation/closure allows laryngeal penetration of liquids and vallecular retention.  Pt denies sensation to retention however will frequently propel vallecular residual into oral cavity and reswallow.  No aspiration but pt demonstrates laryngeal penetration to cords with thin.  Cued cough/throat clearing removed trace penetration.  Pharyngeal retention worse with solid than liquids - thus recommend start with full liquid diet with  strict precautions.  SlP will follow up next date for tolerance and readinesss for dietary advancement.  Using teach back with live feed, pt educated to recommendations and precautions.  Of note, pt did NOT cough during the MBS, but overtly coughed and expectorated viscous secretions *minimal blood tinged* after swallowing water.  Anticipate acute dysphagia to improve with decreased pharyngeal edema and conditioning. SLP Visit Diagnosis Dysphagia, pharyngeal phase (R13.13);Dysphagia, pharyngoesophageal phase (R13.14) Attention and concentration deficit following -- Frontal lobe and executive function deficit following -- Impact on safety and function Mild aspiration risk   CHL IP TREATMENT RECOMMENDATION 03/02/2020 Treatment Recommendations Therapy as outlined in treatment plan below   Prognosis 03/02/2020 Prognosis for Safe Diet Advancement Fair Barriers to Reach Goals -- Barriers/Prognosis Comment -- CHL IP DIET RECOMMENDATION 03/02/2020 SLP Diet Recommendations Thin liquid Liquid Administration via -- Medication Administration Whole meds with puree Compensations Slow rate;Small sips/bites;Other (Comment) Postural Changes --   CHL IP OTHER RECOMMENDATIONS 03/02/2020 Recommended Consults -- Oral Care Recommendations Oral care QID Other Recommendations --   CHL IP FOLLOW UP RECOMMENDATIONS 03/02/2020 Follow up Recommendations (No Data)   CHL IP FREQUENCY AND DURATION 03/02/2020 Speech Therapy Frequency (ACUTE ONLY) min 1 x/week Treatment Duration 1 week      CHL IP ORAL PHASE 03/02/2020 Oral Phase Impaired Oral - Pudding Teaspoon -- Oral - Pudding Cup -- Oral - Honey Teaspoon -- Oral - Honey Cup -- Oral - Nectar Teaspoon -- Oral - Nectar Cup WFL Oral - Nectar Straw WFL Oral - Thin Teaspoon WFL Oral - Thin Cup WFL Oral - Thin Straw WFL Oral - Puree Lingual pumping Oral - Mech Soft Lingual pumping Oral - Regular -- Oral - Multi-Consistency -- Oral - Pill -- Oral Phase - Comment pt propels boluses from pharynx/pharyngeal  tongue base into oral cavity and re-swallows  CHL IP PHARYNGEAL PHASE 03/02/2020 Pharyngeal Phase Impaired Pharyngeal- Pudding Teaspoon -- Pharyngeal -- Pharyngeal- Pudding Cup -- Pharyngeal -- Pharyngeal- Honey Teaspoon -- Pharyngeal -- Pharyngeal- Honey Cup -- Pharyngeal -- Pharyngeal- Nectar Teaspoon Reduced tongue base retraction;Reduced epiglottic inversion;Reduced laryngeal elevation Pharyngeal Material does not enter airway Pharyngeal- Nectar Cup -- Pharyngeal -- Pharyngeal- Nectar Straw Reduced tongue base retraction;Penetration/Aspiration during swallow;Reduced epiglottic inversion;Reduced laryngeal elevation Pharyngeal Material enters airway, remains ABOVE vocal cords and not ejected out Pharyngeal- Thin Teaspoon Reduced tongue base retraction;Reduced epiglottic inversion Pharyngeal Material enters airway, remains ABOVE vocal cords and not ejected out Pharyngeal- Thin Cup Reduced epiglottic inversion;Reduced laryngeal elevation;Reduced airway/laryngeal closure;Reduced tongue base retraction;Penetration/Aspiration during swallow;Penetration/Apiration after swallow Pharyngeal Material enters airway, CONTACTS cords and not ejected out Pharyngeal- Thin Straw  Reduced epiglottic inversion;Reduced laryngeal elevation Pharyngeal Material enters airway, passes BELOW cords without attempt by patient to eject out (silent aspiration) Pharyngeal- Puree Reduced epiglottic inversion;Reduced tongue base retraction;Pharyngeal residue - valleculae;Reduced laryngeal elevation Pharyngeal Material does not enter airway Pharyngeal- Mechanical Soft Reduced epiglottic inversion;Reduced laryngeal elevation;Reduced tongue base retraction;Pharyngeal residue - valleculae Pharyngeal Material does not enter airway Pharyngeal- Regular -- Pharyngeal -- Pharyngeal- Multi-consistency -- Pharyngeal -- Pharyngeal- Pill WFL Pharyngeal Material does not enter airway Pharyngeal Comment Inconsistent strength of swallow - pharyngeal clearance -  Cues for effortful swallow did not contribute to prevention of retention (approx 30% retained of pudding bolus alone) but tablet with puree (whole) easily cleared (100%).  Pt does not sense retention in pharynx however he will propel vallecular retention into oral cavity and re-swallow *without awareness. Chin down posture not helpful and SlP did not head turn due to appearance of approx one inch of IJ line out of neck. SLP phoned RN regarding concern - covered bandage with transpore and proceeded with testing as advised per RN. Liquid was appears helpful to decrease retention.  CHL IP CERVICAL ESOPHAGEAL PHASE 03/02/2020 Cervical Esophageal Phase WFL Pudding Teaspoon -- Pudding Cup -- Honey Teaspoon -- Honey Cup -- Nectar Teaspoon -- Nectar Cup -- Nectar Straw -- Thin Teaspoon -- Thin Cup -- Thin Straw -- Puree -- Mechanical Soft -- Regular -- Multi-consistency -- Pill -- Cervical Esophageal Comment -- Kathleen Lime, MS [facility] SLP Acute Rehab Services Office [phone] Pager [phone] Macario Golds 03/02/2020, 4:12 PM                 Flora Lipps, MD  [facility] 03/04/2020  If 7PM-7AM, please contact night-coverage

## 2020-03-04 NOTE — Progress Notes (Signed)
TCTS consulted for CABG evaluation. °

## 2020-03-04 NOTE — Plan of Care (Signed)
  Problem: Education: Goal: Knowledge of General Education information will improve Description Including pain rating scale, medication(s)/side effects and non-pharmacologic comfort measures Outcome: Progressing   

## 2020-03-05 ENCOUNTER — Inpatient Hospital Stay (HOSPITAL_COMMUNITY): Payer: Managed Care, Other (non HMO)

## 2020-03-05 DIAGNOSIS — I2510 Atherosclerotic heart disease of native coronary artery without angina pectoris: Secondary | ICD-10-CM

## 2020-03-05 DIAGNOSIS — Z01810 Encounter for preprocedural cardiovascular examination: Secondary | ICD-10-CM

## 2020-03-05 DIAGNOSIS — K922 Gastrointestinal hemorrhage, unspecified: Secondary | ICD-10-CM | POA: Diagnosis not present

## 2020-03-05 DIAGNOSIS — I469 Cardiac arrest, cause unspecified: Secondary | ICD-10-CM | POA: Diagnosis not present

## 2020-03-05 DIAGNOSIS — D62 Acute posthemorrhagic anemia: Secondary | ICD-10-CM | POA: Diagnosis not present

## 2020-03-05 DIAGNOSIS — J9601 Acute respiratory failure with hypoxia: Secondary | ICD-10-CM | POA: Diagnosis not present

## 2020-03-05 LAB — GLUCOSE, CAPILLARY
Glucose-Capillary: 118 mg/dL — ABNORMAL HIGH (ref 70–99)
Glucose-Capillary: 132 mg/dL — ABNORMAL HIGH (ref 70–99)
Glucose-Capillary: 135 mg/dL — ABNORMAL HIGH (ref 70–99)
Glucose-Capillary: 152 mg/dL — ABNORMAL HIGH (ref 70–99)
Glucose-Capillary: 179 mg/dL — ABNORMAL HIGH (ref 70–99)
Glucose-Capillary: 227 mg/dL — ABNORMAL HIGH (ref 70–99)

## 2020-03-05 LAB — BASIC METABOLIC PANEL
Anion gap: 11 (ref 5–15)
BUN: 6 mg/dL (ref 6–20)
CO2: 24 mmol/L (ref 22–32)
Calcium: 8.4 mg/dL — ABNORMAL LOW (ref 8.9–10.3)
Chloride: 100 mmol/L (ref 98–111)
Creatinine, Ser: 0.74 mg/dL (ref 0.61–1.24)
GFR, Estimated: >60 mL/min (ref >60)
Glucose, Bld: 151 mg/dL — ABNORMAL HIGH (ref 70–99)
Potassium: 3.8 mmol/L (ref 3.5–5.1)
Sodium: 135 mmol/L (ref 135–145)

## 2020-03-05 LAB — CBC
HCT: 35.8 % — ABNORMAL LOW (ref 39.0–52.0)
Hemoglobin: 11.4 g/dL — ABNORMAL LOW (ref 13.0–17.0)
MCH: 29.5 pg (ref 26.0–34.0)
MCHC: 31.8 g/dL (ref 30.0–36.0)
MCV: 92.5 fL (ref 80.0–100.0)
Platelets: 266 10*3/uL (ref 150–400)
RBC: 3.87 MIL/uL — ABNORMAL LOW (ref 4.22–5.81)
RDW: 16.4 % — ABNORMAL HIGH (ref 11.5–15.5)
WBC: 6.7 10*3/uL (ref 4.0–10.5)
nRBC: 0 % (ref 0.0–0.2)

## 2020-03-05 MED ORDER — ADULT MULTIVITAMIN W/MINERALS CH
1.0000 | ORAL_TABLET | Freq: Every day | ORAL | Status: DC
  Administered 2020-03-05 – 2020-03-07 (×3): 1 via ORAL
  Filled 2020-03-05 (×3): qty 1

## 2020-03-05 MED ORDER — GLUCERNA SHAKE PO LIQD
237.0000 mL | Freq: Two times a day (BID) | ORAL | Status: DC
  Administered 2020-03-06 – 2020-03-07 (×3): 237 mL via ORAL

## 2020-03-05 MED ORDER — ENSURE MAX PROTEIN PO LIQD
11.0000 [oz_av] | Freq: Every day | ORAL | Status: DC
  Filled 2020-03-05 (×3): qty 330

## 2020-03-05 MED ORDER — METOPROLOL TARTRATE 50 MG PO TABS
50.0000 mg | ORAL_TABLET | Freq: Two times a day (BID) | ORAL | Status: DC
  Administered 2020-03-05 – 2020-03-07 (×5): 50 mg via ORAL
  Filled 2020-03-05 (×5): qty 1

## 2020-03-05 NOTE — Progress Notes (Signed)
VASCULAR LAB    Pre CABG Dopplers have been performed.  See CV proc for preliminary results.   Antigone Crowell, RVT 03/05/2020, 4:41 PM

## 2020-03-05 NOTE — Progress Notes (Signed)
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 Appreciate CT surgical consult - possibly OR on Monday with Dr. Orvan Guzman. No events overnight. Creatinine normal. Weight down from 191 to 183.  Inpatient Medications    Scheduled Meds: . aspirin  81 mg Oral Daily  . Chlorhexidine Gluconate Cloth  6 each Topical Q0600  . docusate  100 mg Per Tube BID  . furosemide  40 mg Intravenous Daily  . insulin aspart  0-9 Units Subcutaneous Q4H  . insulin glargine  10 Units Subcutaneous BID  . magnesium oxide  400 mg Oral BID  . metoprolol tartrate  25 mg Oral BID  . mupirocin ointment  1 application Nasal BID  . pantoprazole  40 mg Oral Q1200  . polyethylene glycol  17 g Per Tube Daily  . potassium chloride  40 mEq Oral BID  . sodium chloride flush  10-40 mL Intracatheter Q12H  . sodium chloride flush  3 mL Intravenous Q12H   Continuous Infusions: . sodium chloride 10 mL/hr at 03/02/20 1127  . sodium chloride     PRN Meds: sodium chloride, sodium chloride, alum & mag hydroxide-simeth, nitroGLYCERIN, phenol, sodium chloride flush, sodium chloride flush   Vital Signs    Vitals:   03/05/20 0017 03/05/20 0345 03/05/20 0730 03/05/20 0907  BP: (!) 110/58 124/80 139/79 133/87  Pulse: 64 68 71 79  Resp: 16 18 18 16   Temp: 98.4 F (36.9 C) 98.7 F (37.1 C) 98.2 F (36.8 C) 98.3 F (36.8 C)  TempSrc: Oral Oral Oral Oral  SpO2: 98% 98% 99% 100%  Weight:  83.4 kg    Height:        Intake/Output Summary (Last 24 hours) at 03/05/2020 0910 Last data filed at 03/05/2020 0814 Gross per 24 hour  Intake 760 ml  Output 1905 ml  Net -1145 ml   Last 3 Weights 03/05/2020 03/04/2020 03/02/2020  Weight (lbs) 183 lb 12.8 oz 191 lb 9.6 oz 189 lb 13.1 oz  Weight (kg) 83.371 kg 86.909 kg 86.1 kg      Telemetry    Normal sinus rhythm -  personally reviewed  ECG    N/A  Physical Exam   GEN: No acute distress.   Neck: No JVD Cardiac: RRR, no murmurs, rubs, or gallops.  Respiratory: Clear to auscultation bilaterally. GI: Soft, nontender, non-distended  MS: No edema; No deformity. Neuro:  Nonfocal  Psych: Normal affect   Labs    High Sensitivity Troponin:   Recent Labs  Lab 02/24/20 0432 02/24/20 1124 02/24/20 1803 02/24/20 2000 03/01/20 0624  TROPONINIHS 12,216* 15,553* 12,157* 9,517* 942*      Chemistry Recent Labs  Lab 03/01/20 0624 03/01/20 2006 03/03/20 0610 03/04/20 0600 03/05/20 0319  NA 138   < > 139 138 135  K 3.3*   < > 3.2* 3.9 3.8  CL 108   < > 100 103 100  CO2 21*   < > 29 25 24   GLUCOSE 276*   < > 168* 167* 151*  BUN 13   < > 6 6 6   CREATININE 0.84   < > 0.80 0.78 0.74  CALCIUM 7.5*   < > 8.2* 8.6* 8.4*  PROT 5.8*  --  6.6  --   --   ALBUMIN 2.3*  --  2.6*  --   --  AST 46*  --  39  --   --   ALT 167*  --  111*  --   --   ALKPHOS 75  --  77  --   --   BILITOT 1.1  --  1.2  --   --   GFRNONAA >60   < > >60 >60 >60  ANIONGAP 9   < > 10 10 11    < > = values in this interval not displayed.     Hematology Recent Labs  Lab 03/01/20 0624 03/03/20 0610 03/05/20 0319  WBC 11.6* 7.9 6.7  RBC 3.34* 3.81* 3.87*  HGB 10.0* 11.3* 11.4*  HCT 30.7* 35.8* 35.8*  MCV 91.9 94.0 92.5  MCH 29.9 29.7 29.5  MCHC 32.6 31.6 31.8  RDW 18.1* 17.4* 16.4*  PLT 167 234 266    BNPNo results for input(s): BNP, PROBNP in the last 168 hours.   DDimer No results for input(s): DDIMER in the last 168 hours.   Radiology    CARDIAC CATHETERIZATION  Result Date: 03/04/2020  Prox RCA lesion is 70% stenosed.  Dist RCA lesion is 50% stenosed.  RPDA lesion is 70% stenosed.  3rd RPL lesion is 99% stenosed.  2nd RPL lesion is 99% stenosed.  Mid LAD lesion is 90% stenosed.  Ost LAD to Prox LAD lesion is 70% stenosed.  Ost Cx to Prox Cx lesion is 100% stenosed.  Mid LM to Ost LAD lesion is 60%  stenosed.  1. Severe proximal LAD stenosis. Severe mid LAD stenosis. 2. Complete occlusion of the proximal Circumflex with filling by right to left and left to left collaterals. 3. The RCA is a large dominant vessel with severe proximal stenosis, moderate distal stenosis. Severe stenosis in the posterolateral artery and the PDA. 4. LVEDP 14 Recommendations: He has severe three vessel CAD. Will keep at [facility] and ask CT surgery to consult for CABG.    Cardiac Studies   Echocardiogram 02/25/2020: Impressions: 1. LVEF 50-55% with wall motion abnormalities suspicious for mid LAD. No  apical thrombus.  2. Left ventricular ejection fraction, by estimation, is 50 to 55%. The  left ventricle has low normal function. The left ventricle demonstrates  regional wall motion abnormalities (see scoring diagram/findings for  description). Left ventricular diastolic  parameters are consistent with Grade II diastolic dysfunction  (pseudonormalization). Elevated left atrial pressure.  3. Right ventricular systolic function is normal. The right ventricular  size is normal. There is moderately elevated pulmonary artery systolic  pressure. The estimated right ventricular systolic pressure is 29.9 mmHg.  4. Left atrial size was mildly dilated.  5. The mitral valve is normal in structure. Moderate mitral valve  regurgitation. No evidence of mitral stenosis.  6. The aortic valve is normal in structure. Aortic valve regurgitation is  not visualized. Mild to moderate aortic valve sclerosis/calcification is  present, without any evidence of aortic stenosis.  7. The inferior vena cava is normal in size with greater than 50%  respiratory variability, suggesting right atrial pressure of 3 mmHg.    Diagnostic cath 03/04/20 Dominance: Right      Patient Profile     Colton Guzman is a 60 y.o.malewith a history of hypertension and type 2 diabetes but no known cardiac history who was admitted on 02/22/2020 for  further evaluation of hematochezia. On 02/23/2020, patient had a chest pain episode with blood bowel movement. Chest pain resolved with Nitro. However, he then had recurrent chest pain and actually had a VT arrest. Received  2-5 minutes of CPR and was cardioverted. Post-resuscitation rhythm was trial fibrillation. Cardiology was consulted for further evaluation/management of VF arrest and atrial fibrillation.  Assessment & Plan    VT Arrest NSTEMI - Patient had VT arrest on 1/4 in setting of ongoing GI bleeding. ROSC obtained after about 2-5 minutes of CPR and cardioversion.  - High-sensitivity troponin peaked at >15,000 on 1/5 and has trended down. - EKG shows Q waves in V1-V2 and as well as slight ST depression in V4-V6 and lead II.  - Echo showed LVEF of 50-55% with hypokinesis of mid and distal anterior wall, mid and distal anterior septum, and apex as well as grade 2 diastolic dysfunction. Moderate MR and moderately elevated PASP of 48.7 mmHg also noted. - IV Heparin was started on 1/6 without bolus after clearance with GI. However, he had recurrent bleeding midnight on 1/8. Therefore, Heparin was stopped. -Patient denies any chest pain.  - Continue Aspirin.  - Increase lopressor to 50 mg BID tonight. - Home statin on hold due to elevated LFTs. Check LFT's in am and restart statin if normalized - Cath demonstrated severe multivessel CAD - plan for possible CABG on Monday.  Possible Cardiogenic Shock - COOX in the 50's on 1/5 consistent with cardiogenic shock but was 97 on last check on 1/6.  - Patient was started on pressors after VT arrest but these have all been weaned off. Has not been on any pressors since 1/8. - Patient now being diuresed with IV Lasix with good response. Documented urinary output of 1.8 L yesterday but net positive 1.7 L this admission. No documented weight the last 2 days but weight was down 5 lbs from admission on 1/12. Renal function stable. - Continue IV Lasix for  now. May be able to transition to PO soon.  - Continue to monitor daily weight, strict I/O's, and renal function.  Acute Hypoxic Respiratory Failure Post Arrest Requiring Intubation Suspect Aspiration due to Cardiac Arrest Acute Pulmonary Edema - Extubatedon 1/11.O2 sats now in the high 90's on room air. - Was treated with antibiotics.  - Diuresis as above.  - Management per primary team.  Atrial Fibrillation with RVR - Post arrest rhythm with atrial fibrillation with RVR. However, he then converted to sinus rhythm.  - Maintaining sinus rhythm with continued runs of AIVR. - Continue Lopressor as above. - Not an anticoagulation candidate with ongoing GI bleed - hemoglobin stable.  Hypertension - BP elevated. Systolic BP in the 720'N to 160's. - Increase lopressor to 50 mg BID - Home Losartan-HCTZ and Amlodipine currently on hold. Can likely restarted after cardiac catheterization.  Hyperlipidemia - Home Lipitor on hold for now given elevated LFTs. - Can restart when LFTs normalize (should be soon). Can recheck LFTs tomorrow.   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 Felt to be from diverticular bleed. - EGD on 1/8 negative for bleeding or ulcer.  - Hemoglobin as low as 7.2 this admission. S/p a total of 7 units of PRBCs (last received 4 units on 1/8) Stable at 11.3 yesterday.  - No recurrent bleeding.  - Management per primary team and GI.  Hypokalemia - Potassium 3.8 this morning. Keep > 4.0 given prolonged QTc. Being repleted by primary team. Check CMET and Magnesium tomorrow. - Continue to monitor closely with diuresis.  Otherwise, per primary team:  For questions or updates, please contact [place] Please  consult [email or web address] for contact info under   Pixie Casino, MD, FACC, [place] Director of the  [facility] Diplomate of the American Board of Clinical Lipidology Attending Cardiologist  Direct Dial: [phone]  Fax: [phone]  Website:  [email or web address]   Pixie Casino, MD  03/05/2020, 9:10 AM

## 2020-03-05 NOTE — Progress Notes (Signed)
CARDIAC REHAB PHASE I   PRE:  Rate/Rhythm: 83 SR  BP:  Supine:   Sitting: 147/83  Standing:    SaO2: 97% RA  MODE:  Ambulation: 200 ft   POST:  Rate/Rhythm: 78  BP:  Supine:   Sitting: 136/80  Standing:    SaO2: 97% RA  03/05/20 [id number] Patient tolerated ambulation well with assist x1 and pushing rolling walker. Gait slow, steady, c/o feeling tired, denies chest pain. To chair after walk. Pre-op eduction reviewed including IS use, patient has in room and knows how to use, activity progression, "Safe Movement In Tube" handout reviewed and given. Instructed patient on how to view pre-surgery video. Patient demonstrated getting in and out of chair safely using legs. Patient verbalizes understanding of instructions given.   Sol Passer, MS, ACSM CEP

## 2020-03-05 NOTE — Progress Notes (Signed)
PROGRESS NOTE  Colton Guzman WNU:[id number] DOB: October 18, 1960 DOA: 02/22/2020 PCP: London Pepper, MD   LOS: 12 days   Brief narrative:  60 year old male with past medical history of hypertension, diabetes mellitus was admitted to the hospital on 02/22/20 with painless hematochezia . CT abdomen with angiogram he was performed on the same day which did not show any active bleeding. He did have ongoing bright red bleeding. Patient had belching, chest discomfort and flatus the next day and had VT arrest with 2 min CPR with ROSC.   Initial labs were consistent with evolving hyperglycemia and metabolic acidosis. Patient was also hypotensive and required vasopressors for cardiogenic shock.    Sequence of events  02/23/20. Patient did have cardiac arrest and was intubated.  02/24/20 patient was put on vasopressors, cardiology was consulted. 1/06 PSV wean , off pressors 1/8 >  colonoscopy showed bright red blood, total 4 u prbc given. 1/10 -  remained on vent. Continued TF. 1/12 Extubated 1/13 Transferred to Hospitalist Service 1/14 Cardiology changed to primary 1/15 Anticipating CTS consult, possible surgery on Monday  Assessment/Plan:  Principal Problem:   Acute lower GI bleeding Active Problems:   Acute blood loss anemia   T2DM (type 2 diabetes mellitus) ([place])   Hypertension   Hyperlipidemia   Gout   Acute respiratory failure with hypoxia (HCC)   Cardiac arrest (HCC)   Shock circulatory (HCC)   Fever   NSTEMI (non-ST elevated myocardial infarction) ([place])   Non-ST elevation (NSTEMI) myocardial infarction [facility])   Status post ventricular tachycardia/ cardiac arrest 1/4  Patient also had atrial fibrillation with RVR.  Patient had [place] on 03/03/20.  Electrophysiology on board.   Currently on aspirin and PPI. Cardiology planning for diagnostic cath.  Keep potassium more than 4 and magnesium more than 2 .  Metoprolol dose has been increased to 50 mg twice daily. 1/15 Continue telemetry,  monitor electrolytes  Acute hypoxic respiratory failure and mechanical ventilation post-arrest Suspected Aspiration due to Cardiac Arrest /Acute Pulmonary Edema   Status post extubation.  Resolved.  On room air  Volume overload Patient had significant volume overload and has been started on IV Lasix with potassium. Closely monitor BMP.  Patient is positive for 1750mL at this time.  Has lost significant weight with diuresis. 1/15 Continue diuresis, monitor renal function, electrolytes. BP stable.   Questionable seizure activity vs myoclonus s/p arrest   EEG 02/26/20 was negative for seizures. CT head scan was negative for acute findings. Patient was continued on Keppra empirically.  Keppra has been discontinued at this time due to QTC prolongation.   Fever  Resolved after removing central catheter.  Catheter tip culture pending. Marland Kitchen MRSA PCR negative.  Off antibiotic at this time.  Closely monitor clinically.  WBC has improved to 7.9.  Procalcitonin is negative   Acute GI bleed, hematochezia - Had VT arrest on 1/4 in setting of ongoing GIB. Started on heparin gtt1/6 eveningwithout bolus after clearance from GI. However, had recurrent bleeding midnight on 1/8. Stopped heparin on 1/8. Received PRBC transfusion. We will transfuse as necessary for hemoglobin less than 7. CT angiogram on 02/22/2020 with mild thickening of descending colon. Patient underwent upper and lower endoscopy on 02/27/2020. Possibility of diverticular bleed as per GI.  hemoglobin was 11.3 yesterday. 1/15 Repeat labs in am.   Hypokalemia. Will continue to replenish.  Currently getting p.o. potassium twice daily.   0.3.  Latest potassium was 3.9.  To keep potassium more than 3.9. 1/15 Repeat labs in  am.   Diabetes Mellitus 2 with DKA/HHS Patient initially was on insulin drip. Has been transitioned to Lantus, sliding scale insulin. Patient is on metformin and glyburide at home.  Latest POC glucose of 164  Essential  hypertension Currently on metoprolol  Hyperlipidemia atorvastatin on hold.   DVT prophylaxis: SCDs Start: 02/22/20 1838  Code Status: Full code  Family Communication: None today.  Status is: Inpatient  Remains inpatient appropriate because:IV treatments appropriate due to intensity of illness or inability to take PO and Inpatient level of care appropriate due to severity of illness, arrhythmia, plan for cardiac cath today.  Dispo: The patient is from: Home              Anticipated d/c is to: CIR as per PT evaluation.              Anticipated d/c date is: > 3 days              Patient currently is not medically stable to d/c.  Consultants:  PCCM  Cardiology--Now primary   GI  Electrophysiology cardiology  Procedures:  Intubation and mechanical ventilation  PRBC transfusion  Anti-infectives: None currently  Anti-infectives (From admission, onward)   Start     Dose/Rate Route Frequency Ordered Stop   02/25/20 2200  vancomycin (VANCOREADY) IVPB 1250 mg/250 mL  Status:  Discontinued        1,250 mg 166.7 mL/hr over 90 Minutes Intravenous Every 12 hours 02/25/20 1528 02/28/20 0857   02/25/20 2200  piperacillin-tazobactam (ZOSYN) IVPB 3.375 g  Status:  Discontinued        3.375 g 12.5 mL/hr over 240 Minutes Intravenous Every 8 hours 02/25/20 1528 03/02/20 1047   02/25/20 1600  vancomycin (VANCOCIN) IVPB 1000 mg/200 mL premix        1,000 mg 200 mL/hr over 60 Minutes Intravenous  Once 02/25/20 1528 02/25/20 1800   02/24/20 0000  piperacillin-tazobactam (ZOSYN) IVPB 3.375 g  Status:  Discontinued        3.375 g 100 mL/hr over 30 Minutes Intravenous Every 6 hours 02/23/20 2216 02/23/20 2219   02/23/20 2300  piperacillin-tazobactam (ZOSYN) IVPB 3.375 g  Status:  Discontinued        3.375 g 12.5 mL/hr over 240 Minutes Intravenous Every 8 hours 02/23/20 2220 02/25/20 1528   02/23/20 1200  Ampicillin-Sulbactam (UNASYN) 3 g in sodium chloride 0.9 % 100 mL IVPB  Status:   Discontinued        3 g 200 mL/hr over 30 Minutes Intravenous Every 6 hours 02/23/20 1102 02/23/20 2216     Subjective: Patient reports having chest soreness that is worse with movement due to recent CPR/chest compressions. Denies dyspnea or diaphoresis.   Objective: Vitals:   03/05/20 1112 03/05/20 1554  BP: (!) 118/53 127/72  Pulse: 64 62  Resp: 16 20  Temp: 98.4 F (36.9 C) 98.4 F (36.9 C)  SpO2: 98% 100%    Intake/Output Summary (Last 24 hours) at 03/05/2020 1831 Last data filed at 03/05/2020 1247 Gross per 24 hour  Intake 720 ml  Output 650 ml  Net 70 ml   Filed Weights   03/02/20 0500 03/04/20 1258 03/05/20 0345  Weight: 86.1 kg 86.9 kg 83.4 kg   Body mass index is 27.95 kg/m.   Physical Exam:  GENERAL: Patient is alert awake and oriented. Not in obvious distress.  HENT: No scleral pallor or icterus. Oral mucosa is moist NECK: is supple CHEST:   No cough, normal rate CVS:  S1 and S2 present, RRR ABDOMEN: Soft, non-tender, ND. EXTREMITIES: Trace lower extremity edema noted CNS: Alert, oriented x 3 SKIN: warm and dry without rashes.  Data Review: I have personally reviewed the following laboratory data and study reports,  CBC: Recent Labs  Lab 02/29/20 0230 02/29/20 1100 02/29/20 1747 03/01/20 0624 03/03/20 0610 03/05/20 0319  WBC 11.7*  --  11.0* 11.6* 7.9 6.7  HGB 9.8* 9.2* 9.3* 10.0* 11.3* 11.4*  HCT 29.8* 27.8* 28.9* 30.7* 35.8* 35.8*  MCV 92.3  --  92.6 91.9 94.0 92.5  PLT 126*  --  135* 167 234 081   Basic Metabolic Panel: Recent Labs  Lab 03/01/20 0624 03/01/20 2006 03/02/20 0446 03/03/20 0610 03/04/20 0600 03/05/20 0319  NA 138 142 142 139 138 135  K 3.3* 3.1* 3.3* 3.2* 3.9 3.8  CL 108 102 102 100 103 100  CO2 21* 27 27 29 25 24   GLUCOSE 276* 143* 158* 168* 167* 151*  BUN 13 8 8 6 6 6   CREATININE 0.84 0.72 0.78 0.80 0.78 0.74  CALCIUM 7.5* 7.4* 7.7* 8.2* 8.6* 8.4*  MG 2.0  --  1.8 2.3 2.1  --   PHOS 2.5  --  2.8 3.0 3.1  --     Liver Function Tests: Recent Labs  Lab 03/01/20 0624 03/03/20 0610  AST 46* 39  ALT 167* 111*  ALKPHOS 75 77  BILITOT 1.1 1.2  PROT 5.8* 6.6  ALBUMIN 2.3* 2.6*   No results for input(s): LIPASE, AMYLASE in the last 168 hours. No results for input(s): AMMONIA in the last 168 hours. Cardiac Enzymes: No results for input(s): CKTOTAL, CKMB, CKMBINDEX, TROPONINI in the last 168 hours. BNP (last 3 results) No results for input(s): BNP in the last 8760 hours.  ProBNP (last 3 results) No results for input(s): PROBNP in the last 8760 hours.  CBG: Recent Labs  Lab 03/04/20 2359 03/05/20 0414 03/05/20 0731 03/05/20 1113 03/05/20 1552  GLUCAP 118* 152* 132* 179* 135*   Recent Results (from the past 240 hour(s))  Culture, blood (Routine X 2) w Reflex to ID Panel     Status: None   Collection Time: 02/26/20  5:11 AM   Specimen: BLOOD RIGHT HAND  Result Value Ref Range Status   Specimen Description   Final    BLOOD RIGHT HAND Performed at [facility], [place] [address] [place], [place] [address]    Special Requests   Final    BOTTLES DRAWN AEROBIC ONLY Blood Culture results may not be optimal due to an inadequate volume of blood received in culture bottles Performed at [place] [address], [place], [place] [address]    Culture   Final    NO GROWTH 5 DAYS Performed at [facility], [place] [address] [place], [place] [address]    Report Status 03/02/2020 FINAL  Final  Culture, blood (Routine X 2) w Reflex to ID Panel     Status: None   Collection Time: 02/26/20  5:11 AM   Specimen: BLOOD  Result Value Ref Range Status   Specimen Description   Final    BLOOD LEFT ARM Performed at [place] [address] [place], [place] [address]    Special Requests   Final    BOTTLES DRAWN AEROBIC ONLY Blood Culture results may not be optimal due to an inadequate volume of blood received in culture  bottles Performed at [place] [address]  [place], [place] [address]    Culture   Final    NO GROWTH 5 DAYS Performed at [facility], [place] [address], [place], [place] [address]    Report Status 03/02/2020 FINAL  Final  Cath Tip Culture     Status: None (Preliminary result)   Collection Time: 03/02/20  6:45 PM   Specimen: Catheter Tip; Other  Result Value Ref Range Status   Specimen Description   Final    CATH TIP Performed at [place] [address] [place], [place] [address]    Special Requests   Final    NONE Performed at [facility], [place] [address] [place], [place] [address]    Culture   Final    NO GROWTH 2 DAYS Performed at [place] [address], [place], [place] [address]    Report Status PENDING  Incomplete  Surgical PCR screen     Status: None   Collection Time: 03/04/20  6:34 AM   Specimen: Nasal Mucosa; Nasal Swab  Result Value Ref Range Status   MRSA, PCR NEGATIVE NEGATIVE Final   Staphylococcus aureus NEGATIVE NEGATIVE Final    Comment: (NOTE) The Xpert SA Assay (FDA approved for NASAL specimens in patients 25 years of age and older), is one component of a comprehensive surveillance program. It is not intended to diagnose infection nor to guide or monitor treatment. Performed at [facility], [place] [address] [place], [place] [address]      Studies: CARDIAC CATHETERIZATION  Result Date: 03/04/2020  Prox RCA lesion is 70% stenosed.  Dist RCA lesion is 50% stenosed.  RPDA lesion is 70% stenosed.  3rd RPL lesion is 99% stenosed.  2nd RPL lesion is 99% stenosed.  Mid LAD lesion is 90% stenosed.  Ost LAD to Prox LAD lesion is 70% stenosed.  Ost Cx to Prox Cx lesion is 100% stenosed.  Mid LM to Ost LAD lesion is 60% stenosed.  1. Severe proximal LAD stenosis. Severe mid LAD stenosis. 2. Complete occlusion of the proximal Circumflex with filling by  right to left and left to left collaterals. 3. The RCA is a large dominant vessel with severe proximal stenosis, moderate distal stenosis. Severe stenosis in the posterolateral artery and the PDA. 4. LVEDP 14 Recommendations: He has severe three vessel CAD. Will keep at [facility] and ask CT surgery to consult for CABG.   VAS US DOPPLER PRE CABG  Result Date: 03/05/2020 PREOPERATIVE VASCULAR EVALUATION  Indications:      Pre-CABG. Risk Factors:     Hypertension, hyperlipidemia, Diabetes, coronary artery                   disease. Comparison Study: No prior study Performing Technologist: Sharion Dove RVS  Examination Guidelines: A complete evaluation includes B-mode imaging, spectral Doppler, color Doppler, and power Doppler as needed of all accessible portions of each vessel. Bilateral testing is considered an integral part of a complete examination. Limited examinations for reoccurring indications may be performed as noted.  Right Carotid Findings: +----------+--------+--------+--------+------------+--------+           PSV cm/sEDV cm/sStenosisDescribe    Comments +----------+--------+--------+--------+------------+--------+ CCA Prox  66      15                                   +----------+--------+--------+--------+------------+--------+ CCA Distal67      16                                   +----------+--------+--------+--------+------------+--------+  ICA Prox  57      9               heterogenous         +----------+--------+--------+--------+------------+--------+ ICA Distal62      22                                   +----------+--------+--------+--------+------------+--------+ ECA       143     10                                   +----------+--------+--------+--------+------------+--------+ Portions of this table do not appear on this page. +----------+--------+-------+--------+------------+           PSV cm/sEDV cmsDescribeArm Pressure  +----------+--------+-------+--------+------------+ Subclavian90                                  +----------+--------+-------+--------+------------+ +---------+--------+--+--------+--+ VertebralPSV cm/s67EDV cm/s15 +---------+--------+--+--------+--+ Left Carotid Findings: +----------+--------+--------+--------+------------+------------------+           PSV cm/sEDV cm/sStenosisDescribe    Comments           +----------+--------+--------+--------+------------+------------------+ CCA Prox  93      18                                             +----------+--------+--------+--------+------------+------------------+ CCA Distal83      20                          intimal thickening +----------+--------+--------+--------+------------+------------------+ ICA Prox  58      18              heterogenous                   +----------+--------+--------+--------+------------+------------------+ ICA Distal92      36                                             +----------+--------+--------+--------+------------+------------------+ ECA       146     12                                             +----------+--------+--------+--------+------------+------------------+ +----------+--------+--------+--------+------------+ SubclavianPSV cm/sEDV cm/sDescribeArm Pressure +----------+--------+--------+--------+------------+           229                                  +----------+--------+--------+--------+------------+ +---------+--------+--+--------+-+ VertebralPSV cm/s11EDV cm/s3 +---------+--------+--+--------+-+  ABI Findings: +--------+------------------+-----+-----------+--------+ Right   Rt Pressure (mmHg)IndexWaveform   Comment  +--------+------------------+-----+-----------+--------+ Brachial                       triphasic  line     +--------+------------------+-----+-----------+--------+ PTA     166               1.10 multiphasic          +--------+------------------+-----+-----------+--------+  DP      157               1.04 multiphasic         +--------+------------------+-----+-----------+--------+ +--------+------------------+-----+-----------+-------+ Left    Lt Pressure (mmHg)IndexWaveform   Comment +--------+------------------+-----+-----------+-------+ [id number]                    triphasic          +--------+------------------+-----+-----------+-------+ PTA     160               1.06 multiphasic        +--------+------------------+-----+-----------+-------+ DP      169               1.12 multiphasic        +--------+------------------+-----+-----------+-------+ +-------+---------------+----------------+ ABI/TBIToday's ABI/TBIPrevious ABI/TBI +-------+---------------+----------------+ Right  1.10                            +-------+---------------+----------------+ Left   1.12                            +-------+---------------+----------------+  Right Doppler Findings: +-----------+--------+-----+-----------+---------------------------------------+ Site       PressureIndexDoppler    Comments                                +-----------+--------+-----+-----------+---------------------------------------+ Brachial                triphasic  line                                    +-----------+--------+-----+-----------+---------------------------------------+ Radial                  multiphasic                                        +-----------+--------+-----+-----------+---------------------------------------+ Ulnar                   multiphasic                                        +-----------+--------+-----+-----------+---------------------------------------+ Palmar Arch                        Doppler signal reverses with radial                                        compression                              +-----------+--------+-----+-----------+---------------------------------------+  Left Doppler Findings: +-----------+--------+-----+-----------+---------------------------------------+ Site       PressureIndexDoppler    Comments                                +-----------+--------+-----+-----------+---------------------------------------+ Brachial   151          triphasic                                          +-----------+--------+-----+-----------+---------------------------------------+  Radial                  multiphasic                                        +-----------+--------+-----+-----------+---------------------------------------+ Ulnar                   multiphasic                                        +-----------+--------+-----+-----------+---------------------------------------+ Palmar Arch                        Doppler signal reverses with ulnar                                         compression                             +-----------+--------+-----+-----------+---------------------------------------+  Summary: Right Carotid: The extracranial vessels were near-normal with only minimal wall                thickening or plaque. Left Carotid: The extracranial vessels were near-normal with only minimal wall               thickening or plaque. Vertebrals:  Bilateral vertebral arteries demonstrate antegrade flow. Subclavians: Normal flow hemodynamics were seen in bilateral subclavian              arteries. Right ABI: Resting right ankle-brachial index is within normal range. No evidence of significant right lower extremity arterial disease. Left ABI: Resting left ankle-brachial index is within normal range. No evidence of significant left lower extremity arterial disease. Right Upper Extremity: Doppler waveforms decrease <50% with right radial compression. Doppler waveform obliterate with right ulnar compression. Left Upper Extremity: Doppler waveforms remain  within normal limits with left radial compression. Doppler waveforms decrease <50% with left ulnar compression.    Preliminary    Time spent: 25 minutes   Blain Pais, MD  [facility] 03/05/2020  If 7PM-7AM, please contact night-coverage

## 2020-03-05 NOTE — Progress Notes (Signed)
Nutrition Follow-up  DOCUMENTATION CODES:   Not applicable  INTERVENTION:   -MVI with minerals daily -Glucerna Shake po BID, each supplement provides 220 kcal and 10 grams of protein -Ensure Max po daily, each supplement provides 150 kcal and 30 grams of protein.   NUTRITION DIAGNOSIS:   Inadequate oral intake related to inability to eat as evidenced by NPO status.  Progressing; advanced to PO diet on 03/04/20  GOAL:   Patient will meet greater than or equal to 90% of their needs  Progressing   MONITOR:   Diet advancement,Labs,Weight trends  REASON FOR ASSESSMENT:   Ventilator,Consult Enteral/tube feeding initiation and management  ASSESSMENT:   60 y.o. male with medical history of HTN and type 2 DM. He presented to the ED on 1/3 following hematochezia at 0345 that AM followed by 3 additional episodes. In the ED, FOBT was positive.  1/11- extubated, OGT removed 1/14- s/p BSE- advanced to carb modified diet  Reviewed I/O's: -735 ml x 24 hours and +1 L since admission  UOP: 1.3 L x 24 hours  Pt unavailable at time of attempted contact  Pt with fair appetite. Noted meal completion 60-90%. .  Per CVTS notes,plan for CABG next week.   Medications reviewed and include lasix, magnesium oxide, and miralax.    Lab Results  Component Value Date   HGBA1C 10.3 (H) 02/22/2020   PTA DM medications are .   Labs reviewed: CBGS: 123-179 (inpatient orders for glycemic control are 0-9 units insulin aspart every 4 hours and 10 units insulin glargine BID).    Diet Order:   Diet Order            Diet Carb Modified Fluid consistency: Thin; Room service appropriate? Yes  Diet effective now                 EDUCATION NEEDS:   Not appropriate for education at this time  Skin:  Skin Assessment: Reviewed RN Assessment  Last BM:  1/11 (type 6)  Height:   Ht Readings from Last 1 Encounters:  03/04/20 5\' 8"  (1.727 m)    Weight:   Wt Readings from Last 1  Encounters:  03/05/20 83.4 kg   BMI:  Body mass index is 27.95 kg/m.  Estimated Nutritional Needs:   Kcal:  2050-2250  Protein:  90-105 grams  Fluid:  >/= 2.2 L/day    Loistine Chance, RD, LDN, [place] Registered Dietitian II Certified Diabetes Care and Education Specialist Please refer to [facility] for RD and/or RD on-call/weekend/after hours pager

## 2020-03-05 NOTE — Progress Notes (Signed)
Physical Therapy Treatment Patient Details Name: Rodman Recupero MRN: [id number] DOB: 30-Dec-1960 Today's Date: 03/05/2020    History of Present Illness 60 year old man with hypertension, diabetes admitted 1/3 with painless hematochezia / apparent lower GI bleeding, leukocytosis. Hgb 13.2 >> 11.6 with IVF. CT abdomen with angiography 1/3 did not identify active bleeding source. Has had several more episodes of BRBPR since arrival and admission.  On 1/4 he had an episode of flatus and then belching with chest discomfort.  He was treated with NTG.  Then developed VT arrest with 73min CPR with ROSC.  Labs consistent with evolving hyperglycemia and metabolic acidosis.  Intubated 1/4-1/11/22.    PT Comments    Pt is progressing well towards goals and increased ambulation distance during today's session. CABG expected Monday 1/17. Will continue to follow acutely.   Follow Up Recommendations  Outpatient PT;Other (comment) (cardiac rehab)     Equipment Recommendations  Rolling walker with 5" wheels    Recommendations for Other Services       Precautions / Restrictions Precautions Precautions: Fall    Mobility  Bed Mobility               General bed mobility comments: up in chair on arrival to room.  Transfers Overall transfer level: Needs assistance Equipment used: Rolling walker (2 wheeled) Transfers: Sit to/from Stand Sit to Stand: Min guard         General transfer comment: min/guard for safety  Ambulation/Gait Ambulation/Gait assistance: Min guard Gait Distance (Feet): 350 Feet Assistive device: Rolling walker (2 wheeled) Gait Pattern/deviations: Step-through pattern;Decreased stride length;Decreased dorsiflexion - right;Decreased dorsiflexion - left Gait velocity: decr   General Gait Details: Pt initially dragging feet, however able to correct with cueing. HR in the 80s during ambulation. No c/o CP. Cues for postural control.   Stairs             Wheelchair  Mobility    Modified Rankin (Stroke Patients Only)       Balance Overall balance assessment: Needs assistance Sitting-balance support: Feet supported Sitting balance-Leahy Scale: Good     Standing balance support: Bilateral upper extremity supported Standing balance-Leahy Scale: Poor Standing balance comment: reliant on external support                            Cognition Arousal/Alertness: Awake/alert Behavior During Therapy: WFL for tasks assessed/performed Overall Cognitive Status: Within Functional Limits for tasks assessed                                        Exercises      General Comments General comments (skin integrity, edema, etc.): Wife present for session.      Pertinent Vitals/Pain Pain Assessment: No/denies pain    Home Living                      Prior Function            PT Goals (current goals can now be found in the care plan section) Acute Rehab PT Goals Patient Stated Goal: to walk a mile in the neighborhood PT Goal Formulation: With patient Time For Goal Achievement: 03/17/20 Potential to Achieve Goals: Good Progress towards PT goals: Progressing toward goals    Frequency    Min 3X/week      PT Plan Current plan remains appropriate  Co-evaluation              AM-PAC PT "6 Clicks" Mobility   Outcome Measure  Help needed turning from your back to your side while in a flat bed without using bedrails?: None Help needed moving from lying on your back to sitting on the side of a flat bed without using bedrails?: A Little Help needed moving to and from a bed to a chair (including a wheelchair)?: A Little Help needed standing up from a chair using your arms (e.g., wheelchair or bedside chair)?: None Help needed to walk in hospital room?: A Little Help needed climbing 3-5 steps with a railing? : A Little 6 Click Score: 20    End of Session   Activity Tolerance: Patient tolerated  treatment well Patient left: in chair;with call bell/phone within reach;with family/visitor present Nurse Communication: Mobility status PT Visit Diagnosis: Unsteadiness on feet (R26.81);Difficulty in walking, not elsewhere classified (R26.2)     Time: 1093-2355 PT Time Calculation (min) (ACUTE ONLY): 11 min  Charges:  $Gait Training: 8-22 mins                    Benjiman Core, [place] Pager [phone] Acute Rehab   Allena Katz 03/05/2020, 11:17 AM

## 2020-03-06 DIAGNOSIS — K922 Gastrointestinal hemorrhage, unspecified: Secondary | ICD-10-CM | POA: Diagnosis not present

## 2020-03-06 DIAGNOSIS — J9601 Acute respiratory failure with hypoxia: Secondary | ICD-10-CM | POA: Diagnosis not present

## 2020-03-06 DIAGNOSIS — I469 Cardiac arrest, cause unspecified: Secondary | ICD-10-CM | POA: Diagnosis not present

## 2020-03-06 DIAGNOSIS — D62 Acute posthemorrhagic anemia: Secondary | ICD-10-CM | POA: Diagnosis not present

## 2020-03-06 LAB — COMPREHENSIVE METABOLIC PANEL
ALT: 72 U/L — ABNORMAL HIGH (ref 0–44)
AST: 47 U/L — ABNORMAL HIGH (ref 15–41)
Albumin: 2.6 g/dL — ABNORMAL LOW (ref 3.5–5.0)
Alkaline Phosphatase: 83 U/L (ref 38–126)
Anion gap: 7 (ref 5–15)
BUN: 8 mg/dL (ref 6–20)
CO2: 26 mmol/L (ref 22–32)
Calcium: 8.5 mg/dL — ABNORMAL LOW (ref 8.9–10.3)
Chloride: 103 mmol/L (ref 98–111)
Creatinine, Ser: 0.87 mg/dL (ref 0.61–1.24)
GFR, Estimated: >60 mL/min (ref >60)
Glucose, Bld: 162 mg/dL — ABNORMAL HIGH (ref 70–99)
Potassium: 4.4 mmol/L (ref 3.5–5.1)
Sodium: 136 mmol/L (ref 135–145)
Total Bilirubin: 0.9 mg/dL (ref 0.3–1.2)
Total Protein: 6.7 g/dL (ref 6.5–8.1)

## 2020-03-06 LAB — CATH TIP CULTURE: Culture: NO GROWTH

## 2020-03-06 LAB — PROTIME-INR
INR: 1.2 (ref 0.8–1.2)
Prothrombin Time: 14.3 seconds (ref 11.4–15.2)

## 2020-03-06 LAB — GLUCOSE, CAPILLARY
Glucose-Capillary: 112 mg/dL — ABNORMAL HIGH (ref 70–99)
Glucose-Capillary: 129 mg/dL — ABNORMAL HIGH (ref 70–99)
Glucose-Capillary: 142 mg/dL — ABNORMAL HIGH (ref 70–99)
Glucose-Capillary: 145 mg/dL — ABNORMAL HIGH (ref 70–99)
Glucose-Capillary: 177 mg/dL — ABNORMAL HIGH (ref 70–99)
Glucose-Capillary: 178 mg/dL — ABNORMAL HIGH (ref 70–99)

## 2020-03-06 LAB — MAGNESIUM: Magnesium: 2.3 mg/dL (ref 1.7–2.4)

## 2020-03-06 NOTE — Progress Notes (Signed)
Chart reviewed Cardiology now primary and no active issues not already addressed by cardiology Please call [facility] if any ?  Gatha Mayer, MD, Marval Regal

## 2020-03-06 NOTE — Progress Notes (Signed)
Progress Note  Patient Name: Colton Guzman Date of Encounter: 03/06/2020  [facility] Cardiologist: Freada Bergeron, MD   Subjective   No issues overnight - continue with pre-CABG work-up for Monday. Vitals stable. Labs stable today - potassium 4.4. AST.ALT are 47/72 - statin on hold. INR 1.2.   Inpatient Medications    Scheduled Meds: . aspirin  81 mg Oral Daily  . Chlorhexidine Gluconate Cloth  6 each Topical Q0600  . docusate  100 mg Per Tube BID  . feeding supplement (GLUCERNA SHAKE)  237 mL Oral BID BM  . furosemide  40 mg Intravenous Daily  . insulin aspart  0-9 Units Subcutaneous Q4H  . insulin glargine  10 Units Subcutaneous BID  . magnesium oxide  400 mg Oral BID  . metoprolol tartrate  50 mg Oral BID  . multivitamin with minerals  1 tablet Oral Daily  . mupirocin ointment  1 application Nasal BID  . pantoprazole  40 mg Oral Q1200  . polyethylene glycol  17 g Per Tube Daily  . potassium chloride  40 mEq Oral BID  . Ensure Max Protein  11 oz Oral QHS  . sodium chloride flush  10-40 mL Intracatheter Q12H  . sodium chloride flush  3 mL Intravenous Q12H   Continuous Infusions: . sodium chloride 10 mL/hr at 03/02/20 1127  . sodium chloride     PRN Meds: sodium chloride, sodium chloride, alum & mag hydroxide-simeth, nitroGLYCERIN, phenol, sodium chloride flush, sodium chloride flush   Vital Signs    Vitals:   03/05/20 1112 03/05/20 1554 03/05/20 2122 03/06/20 0515  BP: (!) 118/53 127/72 112/66 118/64  Pulse: 64 62 73 61  Resp: 16 20 16 16   Temp: 98.4 F (36.9 C) 98.4 F (36.9 C) 98.2 F (36.8 C) 98.3 F (36.8 C)  TempSrc: Oral Oral Oral Oral  SpO2: 98% 100% 98% 98%  Weight:    82.6 kg  Height:        Intake/Output Summary (Last 24 hours) at 03/06/2020 0734 Last data filed at 03/06/2020 0500 Gross per 24 hour  Intake 960 ml  Output 500 ml  Net 460 ml   Last 3 Weights 03/06/2020 03/05/2020 03/04/2020  Weight (lbs) 182 lb 183 lb 12.8 oz 191 lb  9.6 oz  Weight (kg) 82.555 kg 83.371 kg 86.909 kg      Telemetry    Normal sinus rhythm - personally reviewed  ECG    N/A  Physical Exam   GEN: No acute distress.   Neck: No JVD Cardiac: RRR, no murmurs, rubs, or gallops.  Respiratory: Clear to auscultation bilaterally. GI: Soft, nontender, non-distended  MS: No edema; No deformity. Neuro:  Nonfocal  Psych: Normal affect   Labs    High Sensitivity Troponin:   Recent Labs  Lab 02/24/20 0432 02/24/20 1124 02/24/20 1803 02/24/20 2000 03/01/20 0624  TROPONINIHS 12,216* 15,553* 12,157* 9,517* 942*      Chemistry Recent Labs  Lab 03/01/20 0624 03/01/20 2006 03/03/20 0610 03/04/20 0600 03/05/20 0319 03/06/20 0339  NA 138   < > 139 138 135 136  K 3.3*   < > 3.2* 3.9 3.8 4.4  CL 108   < > 100 103 100 103  CO2 21*   < > 29 25 24 26   GLUCOSE 276*   < > 168* 167* 151* 162*  BUN 13   < > 6 6 6 8   CREATININE 0.84   < > 0.80 0.78 0.74 0.87  CALCIUM 7.5*   < >  8.2* 8.6* 8.4* 8.5*  PROT 5.8*  --  6.6  --   --  6.7  ALBUMIN 2.3*  --  2.6*  --   --  2.6*  AST 46*  --  39  --   --  47*  ALT 167*  --  111*  --   --  72*  ALKPHOS 75  --  77  --   --  83  BILITOT 1.1  --  1.2  --   --  0.9  GFRNONAA >60   < > >60 >60 >60 >60  ANIONGAP 9   < > 10 10 11 7    < > = values in this interval not displayed.     Hematology Recent Labs  Lab 03/01/20 0624 03/03/20 0610 03/05/20 0319  WBC 11.6* 7.9 6.7  RBC 3.34* 3.81* 3.87*  HGB 10.0* 11.3* 11.4*  HCT 30.7* 35.8* 35.8*  MCV 91.9 94.0 92.5  MCH 29.9 29.7 29.5  MCHC 32.6 31.6 31.8  RDW 18.1* 17.4* 16.4*  PLT 167 234 266    BNPNo results for input(s): BNP, PROBNP in the last 168 hours.   DDimer No results for input(s): DDIMER in the last 168 hours.   Radiology    CARDIAC CATHETERIZATION  Result Date: 03/04/2020  Prox RCA lesion is 70% stenosed.  Dist RCA lesion is 50% stenosed.  RPDA lesion is 70% stenosed.  3rd RPL lesion is 99% stenosed.  2nd RPL lesion is  99% stenosed.  Mid LAD lesion is 90% stenosed.  Ost LAD to Prox LAD lesion is 70% stenosed.  Ost Cx to Prox Cx lesion is 100% stenosed.  Mid LM to Ost LAD lesion is 60% stenosed.  1. Severe proximal LAD stenosis. Severe mid LAD stenosis. 2. Complete occlusion of the proximal Circumflex with filling by right to left and left to left collaterals. 3. The RCA is a large dominant vessel with severe proximal stenosis, moderate distal stenosis. Severe stenosis in the posterolateral artery and the PDA. 4. LVEDP 14 Recommendations: He has severe three vessel CAD. Will keep at [facility] and ask CT surgery to consult for CABG.   VAS US DOPPLER PRE CABG  Result Date: 03/05/2020 PREOPERATIVE VASCULAR EVALUATION  Indications:      Pre-CABG. Risk Factors:     Hypertension, hyperlipidemia, Diabetes, coronary artery                   disease. Comparison Study: No prior study Performing Technologist: Sharion Dove RVS  Examination Guidelines: A complete evaluation includes B-mode imaging, spectral Doppler, color Doppler, and power Doppler as needed of all accessible portions of each vessel. Bilateral testing is considered an integral part of a complete examination. Limited examinations for reoccurring indications may be performed as noted.  Right Carotid Findings: +----------+--------+--------+--------+------------+--------+           PSV cm/sEDV cm/sStenosisDescribe    Comments +----------+--------+--------+--------+------------+--------+ CCA Prox  66      15                                   +----------+--------+--------+--------+------------+--------+ CCA Distal67      16                                   +----------+--------+--------+--------+------------+--------+ ICA Prox  57      9  heterogenous         +----------+--------+--------+--------+------------+--------+ ICA Distal62      22                                    +----------+--------+--------+--------+------------+--------+ ECA       143     10                                   +----------+--------+--------+--------+------------+--------+ Portions of this table do not appear on this page. +----------+--------+-------+--------+------------+           PSV cm/sEDV cmsDescribeArm Pressure +----------+--------+-------+--------+------------+ Subclavian90                                  +----------+--------+-------+--------+------------+ +---------+--------+--+--------+--+ VertebralPSV cm/s67EDV cm/s15 +---------+--------+--+--------+--+ Left Carotid Findings: +----------+--------+--------+--------+------------+------------------+           PSV cm/sEDV cm/sStenosisDescribe    Comments           +----------+--------+--------+--------+------------+------------------+ CCA Prox  93      18                                             +----------+--------+--------+--------+------------+------------------+ CCA Distal83      20                          intimal thickening +----------+--------+--------+--------+------------+------------------+ ICA Prox  58      18              heterogenous                   +----------+--------+--------+--------+------------+------------------+ ICA Distal92      36                                             +----------+--------+--------+--------+------------+------------------+ ECA       146     12                                             +----------+--------+--------+--------+------------+------------------+ +----------+--------+--------+--------+------------+ SubclavianPSV cm/sEDV cm/sDescribeArm Pressure +----------+--------+--------+--------+------------+           229                                  +----------+--------+--------+--------+------------+ +---------+--------+--+--------+-+ VertebralPSV cm/s11EDV cm/s3 +---------+--------+--+--------+-+  ABI Findings:  +--------+------------------+-----+-----------+--------+ Right   Rt Pressure (mmHg)IndexWaveform   Comment  +--------+------------------+-----+-----------+--------+ Brachial                       triphasic  line     +--------+------------------+-----+-----------+--------+ PTA     166               1.10 multiphasic         +--------+------------------+-----+-----------+--------+ DP      157  1.04 multiphasic         +--------+------------------+-----+-----------+--------+ +--------+------------------+-----+-----------+-------+ Left    Lt Pressure (mmHg)IndexWaveform   Comment +--------+------------------+-----+-----------+-------+ [id number]                    triphasic          +--------+------------------+-----+-----------+-------+ PTA     160               1.06 multiphasic        +--------+------------------+-----+-----------+-------+ DP      169               1.12 multiphasic        +--------+------------------+-----+-----------+-------+ +-------+---------------+----------------+ ABI/TBIToday's ABI/TBIPrevious ABI/TBI +-------+---------------+----------------+ Right  1.10                            +-------+---------------+----------------+ Left   1.12                            +-------+---------------+----------------+  Right Doppler Findings: +-----------+--------+-----+-----------+---------------------------------------+ Site       PressureIndexDoppler    Comments                                +-----------+--------+-----+-----------+---------------------------------------+ Brachial                triphasic  line                                    +-----------+--------+-----+-----------+---------------------------------------+ Radial                  multiphasic                                        +-----------+--------+-----+-----------+---------------------------------------+ Ulnar                    multiphasic                                        +-----------+--------+-----+-----------+---------------------------------------+ Palmar Arch                        Doppler signal reverses with radial                                        compression                             +-----------+--------+-----+-----------+---------------------------------------+  Left Doppler Findings: +-----------+--------+-----+-----------+---------------------------------------+ Site       PressureIndexDoppler    Comments                                +-----------+--------+-----+-----------+---------------------------------------+ Brachial   151          triphasic                                          +-----------+--------+-----+-----------+---------------------------------------+  Radial                  multiphasic                                        +-----------+--------+-----+-----------+---------------------------------------+ Ulnar                   multiphasic                                        +-----------+--------+-----+-----------+---------------------------------------+ Palmar Arch                        Doppler signal reverses with ulnar                                         compression                             +-----------+--------+-----+-----------+---------------------------------------+  Summary: Right Carotid: The extracranial vessels were near-normal with only minimal wall                thickening or plaque. Left Carotid: The extracranial vessels were near-normal with only minimal wall               thickening or plaque. Vertebrals:  Bilateral vertebral arteries demonstrate antegrade flow. Subclavians: Normal flow hemodynamics were seen in bilateral subclavian              arteries. Right ABI: Resting right ankle-brachial index is within normal range. No evidence of significant right lower extremity arterial disease. Left ABI: Resting left  ankle-brachial index is within normal range. No evidence of significant left lower extremity arterial disease. Right Upper Extremity: Doppler waveforms decrease <50% with right radial compression. Doppler waveform obliterate with right ulnar compression. Left Upper Extremity: Doppler waveforms remain within normal limits with left radial compression. Doppler waveforms decrease <50% with left ulnar compression.  Electronically signed by Servando Snare MD on 03/05/2020 at 11:05:23 PM.    Final     Cardiac Studies   Echocardiogram 02/25/2020: Impressions: 1. LVEF 50-55% with wall motion abnormalities suspicious for mid LAD. No  apical thrombus.  2. Left ventricular ejection fraction, by estimation, is 50 to 55%. The  left ventricle has low normal function. The left ventricle demonstrates  regional wall motion abnormalities (see scoring diagram/findings for  description). Left ventricular diastolic  parameters are consistent with Grade II diastolic dysfunction  (pseudonormalization). Elevated left atrial pressure.  3. Right ventricular systolic function is normal. The right ventricular  size is normal. There is moderately elevated pulmonary artery systolic  pressure. The estimated right ventricular systolic pressure is 54.6 mmHg.  4. Left atrial size was mildly dilated.  5. The mitral valve is normal in structure. Moderate mitral valve  regurgitation. No evidence of mitral stenosis.  6. The aortic valve is normal in structure. Aortic valve regurgitation is  not visualized. Mild to moderate aortic valve sclerosis/calcification is  present, without any evidence of aortic stenosis.  7. The inferior vena cava is normal in size with greater than 50%  respiratory variability, suggesting right atrial pressure of 3 mmHg.  Diagnostic cath 03/04/20 Dominance: Right      Patient Profile     Colton Guzman is a 60 y.o.malewith a history of hypertension and type 2 diabetes but no known cardiac  history who was admitted on 02/22/2020 for further evaluation of hematochezia. On 02/23/2020, patient had a chest pain episode with blood bowel movement. Chest pain resolved with Nitro. However, he then had recurrent chest pain and actually had a VT arrest. Received 2-5 minutes of CPR and was cardioverted. Post-resuscitation rhythm was trial fibrillation. Cardiology was consulted for further evaluation/management of VF arrest and atrial fibrillation.  Assessment & Plan    VT Arrest NSTEMI - Patient had VT arrest on 1/4 in setting of ongoing GI bleeding. ROSC obtained after about 2-5 minutes of CPR and cardioversion.  - High-sensitivity troponin peaked at >15,000 on 1/5 and has trended down. - EKG shows Q waves in V1-V2 and as well as slight ST depression in V4-V6 and lead II.  - Echo showed LVEF of 50-55% with hypokinesis of mid and distal anterior wall, mid and distal anterior septum, and apex as well as grade 2 diastolic dysfunction. Moderate MR and moderately elevated PASP of 48.7 mmHg also noted. - IV Heparin was started on 1/6 without bolus after clearance with GI. However, he had recurrent bleeding midnight on 1/8. Therefore, Heparin was stopped. -Patient denies any chest pain.  - Continue Aspirin.  -continue lopressor 50 mg BID - Home statin on hold due to elevated LFTs, would resume post-CABG if they normalize - Cath demonstrated severe multivessel CAD - plan for possible CABG on Monday.   Acute Hypoxic Respiratory Failure Post Arrest Requiring Intubation Suspect Aspiration due to Cardiac Arrest Acute Pulmonary Edema - Extubatedon 1/11.O2 sats now in the high 90's on room air. - Was treated with antibiotics.  - Diuresis as above.  - Management per primary team.  Atrial Fibrillation with RVR - Post arrest rhythm with atrial fibrillation with RVR. However, he then converted to sinus rhythm.  - Maintaining sinus rhythm  - Continue Lopressor as above. - Not an anticoagulation  candidate with ongoing GI bleed - hemoglobin stable.  Hypertension - BP improved - Continue lopressor to 50 mg BID - Home Losartan-HCTZ and Amlodipine currently on hold. Can likely restart losartan after CABG.  Hyperlipidemia - Home Lipitor on hold for now given elevated LFTs. 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 Felt to be from diverticular bleed. - EGD on 1/8 negative for bleeding or ulcer.  - Hemoglobin as low as 7.2 this admission. S/p a total of 7 units of PRBCs (last received 4 units on 1/8) Stable at 11.3 yesterday.  - No recurrent bleeding.  - Management per primary team and GI.  Hypokalemia - Potassium 4.4 today, monitor  Otherwise, per primary team:  For questions or updates, please contact [place] Please consult [email or web address] for contact info under   Pixie Casino, MD, FACC, [place] Director of the [facility] Diplomate of the American Board of Clinical Lipidology Attending Cardiologist  Direct Dial: [phone]  Fax: [phone]  Website:  [email or web address]   Pixie Casino, MD  03/06/2020, 7:34 AM

## 2020-03-06 NOTE — Progress Notes (Signed)
Inpatient Rehabilitation Admissions Coordinator   Note plans for CABG. I will sign off at this time. Please re consult if appropriate.  Danne Baxter, RN, MSN Rehab Admissions Coordinator [phone] 03/06/2020 1:07 PM

## 2020-03-07 ENCOUNTER — Inpatient Hospital Stay (HOSPITAL_COMMUNITY): Payer: Managed Care, Other (non HMO)

## 2020-03-07 DIAGNOSIS — I469 Cardiac arrest, cause unspecified: Secondary | ICD-10-CM | POA: Diagnosis not present

## 2020-03-07 DIAGNOSIS — I214 Non-ST elevation (NSTEMI) myocardial infarction: Secondary | ICD-10-CM | POA: Diagnosis not present

## 2020-03-07 LAB — GLUCOSE, CAPILLARY
Glucose-Capillary: 135 mg/dL — ABNORMAL HIGH (ref 70–99)
Glucose-Capillary: 135 mg/dL — ABNORMAL HIGH (ref 70–99)
Glucose-Capillary: 155 mg/dL — ABNORMAL HIGH (ref 70–99)
Glucose-Capillary: 160 mg/dL — ABNORMAL HIGH (ref 70–99)
Glucose-Capillary: 199 mg/dL — ABNORMAL HIGH (ref 70–99)
Glucose-Capillary: 232 mg/dL — ABNORMAL HIGH (ref 70–99)

## 2020-03-07 LAB — APTT: aPTT: 29 seconds (ref 24–36)

## 2020-03-07 LAB — CBC
HCT: 37.4 % — ABNORMAL LOW (ref 39.0–52.0)
Hemoglobin: 12.4 g/dL — ABNORMAL LOW (ref 13.0–17.0)
MCH: 30.2 pg (ref 26.0–34.0)
MCHC: 33.2 g/dL (ref 30.0–36.0)
MCV: 91.2 fL (ref 80.0–100.0)
Platelets: 349 10*3/uL (ref 150–400)
RBC: 4.1 MIL/uL — ABNORMAL LOW (ref 4.22–5.81)
RDW: 17 % — ABNORMAL HIGH (ref 11.5–15.5)
WBC: 7.8 10*3/uL (ref 4.0–10.5)
nRBC: 0 % (ref 0.0–0.2)

## 2020-03-07 LAB — HEMOGLOBIN A1C
Hgb A1c MFr Bld: 5.9 % — ABNORMAL HIGH (ref 4.8–5.6)
Mean Plasma Glucose: 122.63 mg/dL

## 2020-03-07 LAB — BASIC METABOLIC PANEL
Anion gap: 11 (ref 5–15)
BUN: 12 mg/dL (ref 6–20)
CO2: 21 mmol/L — ABNORMAL LOW (ref 22–32)
Calcium: 8.7 mg/dL — ABNORMAL LOW (ref 8.9–10.3)
Chloride: 101 mmol/L (ref 98–111)
Creatinine, Ser: 0.9 mg/dL (ref 0.61–1.24)
GFR, Estimated: >60 mL/min (ref >60)
Glucose, Bld: 290 mg/dL — ABNORMAL HIGH (ref 70–99)
Potassium: 4.1 mmol/L (ref 3.5–5.1)
Sodium: 133 mmol/L — ABNORMAL LOW (ref 135–145)

## 2020-03-07 LAB — SURGICAL PCR SCREEN
MRSA, PCR: NEGATIVE
Staphylococcus aureus: POSITIVE — AB

## 2020-03-07 LAB — HEPATIC FUNCTION PANEL
ALT: 80 U/L — ABNORMAL HIGH (ref 0–44)
AST: 63 U/L — ABNORMAL HIGH (ref 15–41)
Albumin: 3.1 g/dL — ABNORMAL LOW (ref 3.5–5.0)
Alkaline Phosphatase: 95 U/L (ref 38–126)
Bilirubin, Direct: 0.2 mg/dL (ref 0.0–0.2)
Indirect Bilirubin: 1 mg/dL — ABNORMAL HIGH (ref 0.3–0.9)
Total Bilirubin: 1.2 mg/dL (ref 0.3–1.2)
Total Protein: 7.6 g/dL (ref 6.5–8.1)

## 2020-03-07 LAB — MAGNESIUM: Magnesium: 2.3 mg/dL (ref 1.7–2.4)

## 2020-03-07 MED ORDER — NITROGLYCERIN IN D5W 200-5 MCG/ML-% IV SOLN
2.0000 ug/min | INTRAVENOUS | Status: DC
  Filled 2020-03-07: qty 250

## 2020-03-07 MED ORDER — SODIUM CHLORIDE 0.9 % IV SOLN
750.0000 mg | INTRAVENOUS | Status: AC
  Administered 2020-03-08: 750 mg via INTRAVENOUS
  Filled 2020-03-07: qty 750

## 2020-03-07 MED ORDER — MAGNESIUM SULFATE 50 % IJ SOLN
40.0000 meq | INTRAMUSCULAR | Status: DC
  Filled 2020-03-07: qty 9.85

## 2020-03-07 MED ORDER — CHLORHEXIDINE GLUCONATE 0.12 % MT SOLN
15.0000 mL | Freq: Once | OROMUCOSAL | Status: AC
  Administered 2020-03-08: 15 mL via OROMUCOSAL
  Filled 2020-03-07: qty 15

## 2020-03-07 MED ORDER — DEXMEDETOMIDINE HCL IN NACL 400 MCG/100ML IV SOLN
0.1000 ug/kg/h | INTRAVENOUS | Status: DC
  Filled 2020-03-07: qty 100

## 2020-03-07 MED ORDER — MILRINONE LACTATE IN DEXTROSE 20-5 MG/100ML-% IV SOLN
0.3000 ug/kg/min | INTRAVENOUS | Status: DC
  Filled 2020-03-07: qty 100

## 2020-03-07 MED ORDER — TRANEXAMIC ACID (OHS) BOLUS VIA INFUSION
15.0000 mg/kg | INTRAVENOUS | Status: AC
  Administered 2020-03-08: 1215 mg via INTRAVENOUS
  Filled 2020-03-07: qty 1215

## 2020-03-07 MED ORDER — NOREPINEPHRINE 4 MG/250ML-% IV SOLN
0.0000 ug/min | INTRAVENOUS | Status: DC
  Filled 2020-03-07: qty 250

## 2020-03-07 MED ORDER — INSULIN REGULAR(HUMAN) IN NACL 100-0.9 UT/100ML-% IV SOLN
INTRAVENOUS | Status: AC
  Administered 2020-03-08: 4 [IU]/h via INTRAVENOUS
  Filled 2020-03-07: qty 100

## 2020-03-07 MED ORDER — EPINEPHRINE HCL 5 MG/250ML IV SOLN IN NS
0.0000 ug/min | INTRAVENOUS | Status: DC
  Filled 2020-03-07: qty 250

## 2020-03-07 MED ORDER — TRANEXAMIC ACID 1000 MG/10ML IV SOLN
1.5000 mg/kg/h | INTRAVENOUS | Status: AC
  Administered 2020-03-08: 1.5 mg/kg/h via INTRAVENOUS
  Filled 2020-03-07: qty 25

## 2020-03-07 MED ORDER — PLASMA-LYTE 148 IV SOLN
INTRAVENOUS | Status: DC
  Filled 2020-03-07: qty 2.5

## 2020-03-07 MED ORDER — SODIUM CHLORIDE 0.9 % IV SOLN
INTRAVENOUS | Status: DC
  Filled 2020-03-07: qty 30

## 2020-03-07 MED ORDER — SODIUM CHLORIDE 0.9 % IV SOLN
1.5000 g | INTRAVENOUS | Status: DC
  Filled 2020-03-07: qty 1.5

## 2020-03-07 MED ORDER — CHLORHEXIDINE GLUCONATE CLOTH 2 % EX PADS: 6.0000 | MEDICATED_PAD | Freq: Once | CUTANEOUS | Status: DC

## 2020-03-07 MED ORDER — POTASSIUM CHLORIDE 2 MEQ/ML IV SOLN
80.0000 meq | INTRAVENOUS | Status: DC
  Filled 2020-03-07: qty 40

## 2020-03-07 MED ORDER — METOPROLOL TARTRATE 12.5 MG HALF TABLET
12.5000 mg | ORAL_TABLET | Freq: Once | ORAL | Status: AC
  Administered 2020-03-08: 12.5 mg via ORAL
  Filled 2020-03-07: qty 1

## 2020-03-07 MED ORDER — TEMAZEPAM 15 MG PO CAPS
15.0000 mg | ORAL_CAPSULE | Freq: Once | ORAL | Status: AC | PRN
  Administered 2020-03-07: 15 mg via ORAL
  Filled 2020-03-07: qty 1

## 2020-03-07 MED ORDER — CHLORHEXIDINE GLUCONATE CLOTH 2 % EX PADS
6.0000 | MEDICATED_PAD | Freq: Once | CUTANEOUS | Status: AC
  Administered 2020-03-07: 6 via TOPICAL

## 2020-03-07 MED ORDER — VANCOMYCIN HCL 1250 MG/250ML IV SOLN
1250.0000 mg | INTRAVENOUS | Status: AC
  Administered 2020-03-08: 1250 mg via INTRAVENOUS
  Filled 2020-03-07: qty 250

## 2020-03-07 MED ORDER — BISACODYL 5 MG PO TBEC
5.0000 mg | DELAYED_RELEASE_TABLET | Freq: Once | ORAL | Status: AC
  Administered 2020-03-07: 5 mg via ORAL
  Filled 2020-03-07: qty 1

## 2020-03-07 MED ORDER — PHENYLEPHRINE HCL-NACL 20-0.9 MG/250ML-% IV SOLN
30.0000 ug/min | INTRAVENOUS | Status: DC
  Filled 2020-03-07: qty 250

## 2020-03-07 MED ORDER — TRANEXAMIC ACID (OHS) PUMP PRIME SOLUTION
2.0000 mg/kg | INTRAVENOUS | Status: DC
  Filled 2020-03-07: qty 1.62

## 2020-03-07 NOTE — Progress Notes (Signed)
Physical Therapy Treatment Patient Details Name: Colton Guzman MRN: [id number] DOB: 08/18/1960 Today's Date: 03/07/2020    History of Present Illness 60 year old man with hypertension, diabetes admitted 1/3 with painless hematochezia / apparent lower GI bleeding, leukocytosis. Hgb 13.2 >> 11.6 with IVF. CT abdomen with angiography 1/3 did not identify active bleeding source. Has had several more episodes of BRBPR since arrival and admission.  On 1/4 he had an episode of flatus and then belching with chest discomfort.  He was treated with NTG.  Then developed VT arrest with 78min CPR with ROSC.  Labs consistent with evolving hyperglycemia and metabolic acidosis.  Intubated 1/4-1/11/22.    PT Comments    Pt making excellent progress towards his physical therapy goals. Ambulating 400 feet with no assistive device without physical difficulty; negotiated 10 steps with a right railing to simulate home set up. SpO2 98% on RA, HR peak 124 bpm. Reviewed sternal precautions in light of upcoming planned CABG. Will need re-evaluation post op.    Follow Up Recommendations  Outpatient PT;Other (comment) (cardiac rehab)     Equipment Recommendations  Rolling walker with 5" wheels    Recommendations for Other Services       Precautions / Restrictions Precautions Precautions: None Restrictions Weight Bearing Restrictions: No    Mobility  Bed Mobility Overal bed mobility: Independent                Transfers Overall transfer level: Independent Equipment used: None                Ambulation/Gait Ambulation/Gait assistance: Modified independent (Device/Increase time) Gait Distance (Feet): 400 Feet Assistive device: None Gait Pattern/deviations: Step-through pattern;Decreased stride length Gait velocity: decr   General Gait Details: Mildly slower, steady pace.   Stairs Stairs: Yes Stairs assistance: Modified independent (Device/Increase time) Stair Management: One rail  Right Number of Stairs: 10 General stair comments: Step over step pattern   Wheelchair Mobility    Modified Rankin (Stroke Patients Only)       Balance Overall balance assessment: Needs assistance Sitting-balance support: Feet supported Sitting balance-Leahy Scale: Normal     Standing balance support: No upper extremity supported;During functional activity Standing balance-Leahy Scale: Good                              Cognition Arousal/Alertness: Awake/alert Behavior During Therapy: WFL for tasks assessed/performed Overall Cognitive Status: Within Functional Limits for tasks assessed                                        Exercises      General Comments        Pertinent Vitals/Pain Pain Assessment: No/denies pain    Home Living                      Prior Function            PT Goals (current goals can now be found in the care plan section) Acute Rehab PT Goals Patient Stated Goal: to walk a mile in the neighborhood PT Goal Formulation: With patient Time For Goal Achievement: 03/17/20 Potential to Achieve Goals: Good Progress towards PT goals: Progressing toward goals    Frequency    Min 3X/week      PT Plan Current plan remains appropriate    Co-evaluation  AM-PAC PT "6 Clicks" Mobility   Outcome Measure  Help needed turning from your back to your side while in a flat bed without using bedrails?: None Help needed moving from lying on your back to sitting on the side of a flat bed without using bedrails?: None Help needed moving to and from a bed to a chair (including a wheelchair)?: None Help needed standing up from a chair using your arms (e.g., wheelchair or bedside chair)?: None Help needed to walk in hospital room?: None Help needed climbing 3-5 steps with a railing? : None 6 Click Score: 24    End of Session   Activity Tolerance: Patient tolerated treatment well Patient left: in  chair;with call bell/phone within reach Nurse Communication: Mobility status PT Visit Diagnosis: Unsteadiness on feet (R26.81);Difficulty in walking, not elsewhere classified (R26.2)     Time: 6148-3073 PT Time Calculation (min) (ACUTE ONLY): 9 min  Charges:  $Therapeutic Activity: 8-22 mins                     Wyona Almas, PT, DPT Acute Rehabilitation Services Pager [phone] Office [phone]    Deno Etienne 03/07/2020, 4:42 PM

## 2020-03-07 NOTE — Progress Notes (Signed)
Progress Note  Patient Name: Colton Guzman Date of Encounter: 03/07/2020  [facility] Cardiologist: Freada Bergeron, MD   Subjective   Feels well. No chest pain, SOB. Anxious to have his surgery.  Net even overnight per I/Os. Weight down from 182-->178.5  Inpatient Medications    Scheduled Meds: . aspirin  81 mg Oral Daily  . Chlorhexidine Gluconate Cloth  6 each Topical Q0600  . docusate  100 mg Per Tube BID  . feeding supplement (GLUCERNA SHAKE)  237 mL Oral BID BM  . furosemide  40 mg Intravenous Daily  . insulin aspart  0-9 Units Subcutaneous Q4H  . insulin glargine  10 Units Subcutaneous BID  . magnesium oxide  400 mg Oral BID  . metoprolol tartrate  50 mg Oral BID  . multivitamin with minerals  1 tablet Oral Daily  . mupirocin ointment  1 application Nasal BID  . pantoprazole  40 mg Oral Q1200  . polyethylene glycol  17 g Per Tube Daily  . Ensure Max Protein  11 oz Oral QHS  . sodium chloride flush  10-40 mL Intracatheter Q12H  . sodium chloride flush  3 mL Intravenous Q12H   Continuous Infusions: . sodium chloride 10 mL/hr at 03/02/20 1127  . sodium chloride     PRN Meds: sodium chloride, sodium chloride, alum & mag hydroxide-simeth, nitroGLYCERIN, phenol, sodium chloride flush, sodium chloride flush   Vital Signs    Vitals:   03/06/20 0858 03/06/20 2054 03/07/20 0515 03/07/20 0521  BP: 115/65 124/82 113/62   Pulse: 71 79 62   Resp:  18 18   Temp:  98.9 F (37.2 C) 98.5 F (36.9 C)   TempSrc:  Oral Oral   SpO2:  98% 96%   Weight:    81 kg  Height:        Intake/Output Summary (Last 24 hours) at 03/07/2020 1003 Last data filed at 03/07/2020 0800 Gross per 24 hour  Intake 720 ml  Output 1150 ml  Net -430 ml   Last 3 Weights 03/07/2020 03/06/2020 03/05/2020  Weight (lbs) 178 lb 8 oz 182 lb 183 lb 12.8 oz  Weight (kg) 80.967 kg 82.555 kg 83.371 kg      Telemetry    Sinus rhythm/sinus brady - Personally Reviewed  ECG    No new tracing -  Personally Reviewed  Physical Exam   GEN: No acute distress.   Neck: No JVD Cardiac: RRR, no murmurs, rubs, or gallops.  Respiratory: Clear to auscultation bilaterally. GI: Soft, nontender, non-distended  MS: No edema; No deformity. Neuro:  Nonfocal  Psych: Normal affect   Labs    High Sensitivity Troponin:   Recent Labs  Lab 02/24/20 0432 02/24/20 1124 02/24/20 1803 02/24/20 2000 03/01/20 0624  TROPONINIHS 12,216* 15,553* 12,157* 9,517* 942*      Chemistry Recent Labs  Lab 03/01/20 0624 03/01/20 2006 03/03/20 0610 03/04/20 0600 03/05/20 0319 03/06/20 0339  NA 138   < > 139 138 135 136  K 3.3*   < > 3.2* 3.9 3.8 4.4  CL 108   < > 100 103 100 103  CO2 21*   < > '29 25 24 26  '$ GLUCOSE 276*   < > 168* 167* 151* 162*  BUN 13   < > '6 6 6 8  '$ CREATININE 0.84   < > 0.80 0.78 0.74 0.87  CALCIUM 7.5*   < > 8.2* 8.6* 8.4* 8.5*  PROT 5.8*  --  6.6  --   --  6.7  ALBUMIN 2.3*  --  2.6*  --   --  2.6*  AST 46*  --  39  --   --  47*  ALT 167*  --  111*  --   --  72*  ALKPHOS 75  --  77  --   --  83  BILITOT 1.1  --  1.2  --   --  0.9  GFRNONAA >60   < > >60 >60 >60 >60  ANIONGAP 9   < > '10 10 11 7   '$ < > = values in this interval not displayed.     Hematology Recent Labs  Lab 03/01/20 0624 03/03/20 0610 03/05/20 0319  WBC 11.6* 7.9 6.7  RBC 3.34* 3.81* 3.87*  HGB 10.0* 11.3* 11.4*  HCT 30.7* 35.8* 35.8*  MCV 91.9 94.0 92.5  MCH 29.9 29.7 29.5  MCHC 32.6 31.6 31.8  RDW 18.1* 17.4* 16.4*  PLT 167 234 266    BNPNo results for input(s): BNP, PROBNP in the last 168 hours.   DDimer No results for input(s): DDIMER in the last 168 hours.   Radiology    VAS US DOPPLER PRE CABG  Result Date: 03/05/2020 PREOPERATIVE VASCULAR EVALUATION  Indications:      Pre-CABG. Risk Factors:     Hypertension, hyperlipidemia, Diabetes, coronary artery                   disease. Comparison Study: No prior study Performing Technologist: Sharion Dove RVS  Examination Guidelines: A  complete evaluation includes B-mode imaging, spectral Doppler, color Doppler, and power Doppler as needed of all accessible portions of each vessel. Bilateral testing is considered an integral part of a complete examination. Limited examinations for reoccurring indications may be performed as noted.  Right Carotid Findings: +----------+--------+--------+--------+------------+--------+           PSV cm/sEDV cm/sStenosisDescribe    Comments +----------+--------+--------+--------+------------+--------+ CCA Prox  66      15                                   +----------+--------+--------+--------+------------+--------+ CCA Distal67      16                                   +----------+--------+--------+--------+------------+--------+ ICA Prox  57      9               heterogenous         +----------+--------+--------+--------+------------+--------+ ICA Distal62      22                                   +----------+--------+--------+--------+------------+--------+ ECA       143     10                                   +----------+--------+--------+--------+------------+--------+ Portions of this table do not appear on this page. +----------+--------+-------+--------+------------+           PSV cm/sEDV cmsDescribeArm Pressure +----------+--------+-------+--------+------------+ Subclavian90                                  +----------+--------+-------+--------+------------+ +---------+--------+--+--------+--+  VertebralPSV cm/s67EDV cm/s15 +---------+--------+--+--------+--+ Left Carotid Findings: +----------+--------+--------+--------+------------+------------------+           PSV cm/sEDV cm/sStenosisDescribe    Comments           +----------+--------+--------+--------+------------+------------------+ CCA Prox  93      18                                             +----------+--------+--------+--------+------------+------------------+ CCA Distal83       20                          intimal thickening +----------+--------+--------+--------+------------+------------------+ ICA Prox  58      18              heterogenous                   +----------+--------+--------+--------+------------+------------------+ ICA Distal92      36                                             +----------+--------+--------+--------+------------+------------------+ ECA       146     12                                             +----------+--------+--------+--------+------------+------------------+ +----------+--------+--------+--------+------------+ SubclavianPSV cm/sEDV cm/sDescribeArm Pressure +----------+--------+--------+--------+------------+           229                                  +----------+--------+--------+--------+------------+ +---------+--------+--+--------+-+ VertebralPSV cm/s11EDV cm/s3 +---------+--------+--+--------+-+  ABI Findings: +--------+------------------+-----+-----------+--------+ Right   Rt Pressure (mmHg)IndexWaveform   Comment  +--------+------------------+-----+-----------+--------+ Brachial                       triphasic  line     +--------+------------------+-----+-----------+--------+ PTA     166               1.10 multiphasic         +--------+------------------+-----+-----------+--------+ DP      157               1.04 multiphasic         +--------+------------------+-----+-----------+--------+ +--------+------------------+-----+-----------+-------+ Left    Lt Pressure (mmHg)IndexWaveform   Comment +--------+------------------+-----+-----------+-------+ [id number]                    triphasic          +--------+------------------+-----+-----------+-------+ PTA     160               1.06 multiphasic        +--------+------------------+-----+-----------+-------+ DP      169               1.12 multiphasic         +--------+------------------+-----+-----------+-------+ +-------+---------------+----------------+ ABI/TBIToday's ABI/TBIPrevious ABI/TBI +-------+---------------+----------------+ Right  1.10                            +-------+---------------+----------------+ Left   1.12                            +-------+---------------+----------------+  Right Doppler Findings: +-----------+--------+-----+-----------+---------------------------------------+ Site       PressureIndexDoppler    Comments                                +-----------+--------+-----+-----------+---------------------------------------+ Brachial                triphasic  line                                    +-----------+--------+-----+-----------+---------------------------------------+ Radial                  multiphasic                                        +-----------+--------+-----+-----------+---------------------------------------+ Ulnar                   multiphasic                                        +-----------+--------+-----+-----------+---------------------------------------+ Palmar Arch                        Doppler signal reverses with radial                                        compression                             +-----------+--------+-----+-----------+---------------------------------------+  Left Doppler Findings: +-----------+--------+-----+-----------+---------------------------------------+ Site       PressureIndexDoppler    Comments                                +-----------+--------+-----+-----------+---------------------------------------+ Brachial   151          triphasic                                          +-----------+--------+-----+-----------+---------------------------------------+ Radial                  multiphasic                                        +-----------+--------+-----+-----------+---------------------------------------+  Ulnar                   multiphasic                                        +-----------+--------+-----+-----------+---------------------------------------+ Palmar Arch                        Doppler signal reverses with ulnar  compression                             +-----------+--------+-----+-----------+---------------------------------------+  Summary: Right Carotid: The extracranial vessels were near-normal with only minimal wall                thickening or plaque. Left Carotid: The extracranial vessels were near-normal with only minimal wall               thickening or plaque. Vertebrals:  Bilateral vertebral arteries demonstrate antegrade flow. Subclavians: Normal flow hemodynamics were seen in bilateral subclavian              arteries. Right ABI: Resting right ankle-brachial index is within normal range. No evidence of significant right lower extremity arterial disease. Left ABI: Resting left ankle-brachial index is within normal range. No evidence of significant left lower extremity arterial disease. Right Upper Extremity: Doppler waveforms decrease <50% with right radial compression. Doppler waveform obliterate with right ulnar compression. Left Upper Extremity: Doppler waveforms remain within normal limits with left radial compression. Doppler waveforms decrease <50% with left ulnar compression.  Electronically signed by Servando Snare MD on 03/05/2020 at 11:05:23 PM.    Final     Cardiac Studies   Echocardiogram 02/25/2020: Impressions: 1. LVEF 50-55% with wall motion abnormalities suspicious for mid LAD. No  apical thrombus.  2. Left ventricular ejection fraction, by estimation, is 50 to 55%. The  left ventricle has low normal function. The left ventricle demonstrates  regional wall motion abnormalities (see scoring diagram/findings for  description). Left ventricular diastolic  parameters are consistent with Grade II diastolic  dysfunction  (pseudonormalization). Elevated left atrial pressure.  3. Right ventricular systolic function is normal. The right ventricular  size is normal. There is moderately elevated pulmonary artery systolic  pressure. The estimated right ventricular systolic pressure is [id number] mmHg.  4. Left atrial size was mildly dilated.  5. The mitral valve is normal in structure. Moderate mitral valve  regurgitation. No evidence of mitral stenosis.  6. The aortic valve is normal in structure. Aortic valve regurgitation is  not visualized. Mild to moderate aortic valve sclerosis/calcification is  present, without any evidence of aortic stenosis.  7. The inferior vena cava is normal in size with greater than 50%  respiratory variability, suggesting right atrial pressure of 3 mmHg.    Diagnostic cath 03/04/20 Dominance: Right       Patient Profile     60 y.o. male with a history of hypertension and type 2 diabetes but no known cardiac history who was admitted on 02/22/2020 for further evaluation of hematochezia with hospital course complicated by chest pain c/b VT arrest requiring shock and CPR x2 minutes.  Trop >15,000 with concern for ischemic etiology prompting cardiology consult. Once stabilized from GIB standpoint, patient underwent coronary angiography found to have multivessel CAD now awaiting CABG.  Assessment & Plan    #Multivessel CAD #VT Arrest #NSTEMI: Patient presented with hematochezia with course complicated by chest pain with VT arrest on 01/04 requiring shock and 2-73mn of CPR with return of ROSC. High sensitivity troponin >15,000. EKG with Q waves in V1-V2 and as well as slight ST depression in V4-V6 and lead II. TTE with LVEF of 50-55% with hypokinesis of mid and distal anterior wall, mid and distal anterior septum, and apex as well as grade 2 diastolic dysfunction. Moderate MR and moderately elevated PASP of 48.7 mmHg. He was stabilized from GIB standpoint  with  successful extubation on 1/11 and underwent coronary angiography found to have multivessel CAD. Now awaiting CABG. -Plan for CABG per CV surgery  -Continue ASA '81mg'$  daily -Continue metoprolol '50mg'$  BID--change to long-acting prior to discharge -Hold statin due to elevated LFTs--start when able -Add ACE/ARB post-CABG pending renal funciton and blood pressure -Seen by EP for VT/AIVR--recommend management of ischemia as above--can use amio or lido if recurrence -Plan for cardiac rehab  #Acute Hypoxic Respiratory Failure Post-Cardiac Arrest #Suspected Aspiration due to Cardiac Arrest #Acute pulmonary edema: Improved and successfully extubated on 1/11. On room air. -Gentle diuresis with lasix '40mg'$  IV daily -S/p course of ABX -Continue to monitor  #Paroxysmal Afib: Post-arrest rhythm demonstrated Afib with RVR. Newly diagnosed. Holding AC due to recent significant GIB. May need to consider in the future once clinically improved. CHADs-vasc 3 with 4% annual risk of stroke. 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 Felt to be from diverticular bleed. - EGD on 1/8 negative for bleeding or ulcer.  - Hemoglobin as low as 7.2 this admission. S/p a total of 7 units of PRBCs (last received 4 units on 1/8) Stable in 11's currently -No recurrent bleeding.  - Management per GI  For questions or updates, please contact [place] Please consult [email or web address] for contact info under        Signed, Freada Bergeron, MD   03/07/2020, 10:03 AM

## 2020-03-07 NOTE — Progress Notes (Signed)
[facility] consult progress note                                                                               Patient Demographics  Colton Guzman, is a 60 y.o. male, DOB - Jun 26, 1960, NID:[id number]  Admit date - 02/22/2020   Admitting Physician Flora Lipps, MD  Outpatient Primary MD for the patient is London Pepper, MD  Outpatient specialists:   LOS - 14  days   Medical records reviewed and are as summarized below:    Chief Complaint  Patient presents with  . Rectal Bleeding       Brief summary    60 year old male with past medical history of hypertension, diabetes mellitus was admitted to the hospital on 02/22/20 with painless hematochezia . CT abdomen with angiogram he was performed on the same day which did not show any active bleeding. He did have ongoing bright red bleeding. Patient had belching, chest discomfort and flatus the next day and had VT arrest with 2 min CPR with ROSC.  Initial labs were consistent with evolving hyperglycemia and metabolic acidosis. Patient was also hypotensive and required vasopressors for cardiogenic shock.   Sequence of events  02/23/20. Patient did have cardiac arrest and was intubated.  02/24/20 patient was put on vasopressors, cardiology was consulted. 1/06 PSV wean , off pressors 1/8 > colonoscopy showed bright red blood, total 4 u prbc given. 1/10 -remained on vent. Continued TF. 1/12 Extubated 1/13 Transferred to Hospitalist Service 1/14 Cardiology changed to primary 1/15  CTVS consult, possible surgery on 1/17 or 1/18   Assessment & Plan    Status post VT cardiac arrest 1/4 , multivessel CAD,  NSTEMI -Patient presented with hematochezia, course complicated by chest pain and VT arrest on 1/4, requiring shock and 2 to 5 minutes of CPR with return of ROSC -2D echo with EF of 50 to 55% with hypokinesis of the mid and distal anterior wall, mid and distal anterior septum, and apex, grade 2 DD, troponin >  15,000. -Underwent cardiac cath found to have multivessel CAD, CTS consulted, now awaiting CABG  Acute hypoxic respiratory failure and mechanical ventilation post-arrest Suspected Aspiration due to Cardiac Arrest /Acute Pulmonary Edema Status post extubation.   -Resolved, currently on room air -Pulmonary edema has improved, was successfully extubated on 1/11. -Per cardiology/primary service, continue gentle diuresis with Lasix 40 mg IV daily   Questionable seizure activity vs myoclonus s/p arrest   -EEG 02/26/20 wasnegative for seizures.  -CT head scan was negative for acute findings.  -Patient was continued on Keppra empirically.  Keppra has been discontinued at this time due to QTC prolongation.  Fever  Resolved after removing central catheter.  Catheter tip culture pending. Marland Kitchen MRSA PCR negative.  Off antibiotic at this time.  Closely monitor clinically.  WBC has improved to 7.9.  Procalcitonin is negative   Acute GI bleed, hematochezia likely secondary to diverticular bleed - Had VT arrest on 1/4 in setting of ongoing GIB.Started on heparin gtt1/6 eveningwithout bolus after clearance from GI. However, had recurrent bleeding midnight on 1/8. Stopped heparin on 1/8. -Received PRBC transfusion, transfuse as needed for  hemoglobin less than 8. - CT angiogram on 02/22/2020 with mild thickening of descending colon -EGD on 1/8 negative, colonoscopy showed red clotted blood in the entire colon with multiple diverticula throughout, felt to be from diverticular bleed -Currently H&H stable   Diabetes Mellitus 2 with DKA/HHS, uncontrolled -Initially placed on insulin drip, now has been transitioned to Lantus, sliding scale insulin. Hemoglobin A1c 10.3 on 02/22/2020  Essential hypertension Currently on metoprolol  Hyperlipidemia atorvastatin on hold.   Code Status: Full CODE STATUS DVT Prophylaxis: SCDs Family Communication: Discussed all imaging results, lab results, explained  to the patient and wife at the bedside   Disposition Plan:     Status is: Inpatient  Remains inpatient appropriate because:Inpatient level of care appropriate due to severity of illness   Dispo: The patient is from: Home              Anticipated d/c is to: Home              Anticipated d/c date is: > 3 days              Patient currently is not medically stable to d/c.,  Currently awaiting CABG, TBD by CTVS      Time Spent in minutes   35 minutes  Procedures:  Cardiac cath  Consultants:   Cardiology EP cardiology, Critical care  Antimicrobials:   Anti-infectives (From admission, onward)   Start     Dose/Rate Route Frequency Ordered Stop   02/25/20 2200  vancomycin (VANCOREADY) IVPB 1250 mg/250 mL  Status:  Discontinued        1,250 mg 166.7 mL/hr over 90 Minutes Intravenous Every 12 hours 02/25/20 1528 02/28/20 0857   02/25/20 2200  piperacillin-tazobactam (ZOSYN) IVPB 3.375 g  Status:  Discontinued        3.375 g 12.5 mL/hr over 240 Minutes Intravenous Every 8 hours 02/25/20 1528 03/02/20 1047   02/25/20 1600  vancomycin (VANCOCIN) IVPB 1000 mg/200 mL premix        1,000 mg 200 mL/hr over 60 Minutes Intravenous  Once 02/25/20 1528 02/25/20 1800   02/24/20 0000  piperacillin-tazobactam (ZOSYN) IVPB 3.375 g  Status:  Discontinued        3.375 g 100 mL/hr over 30 Minutes Intravenous Every 6 hours 02/23/20 2216 02/23/20 2219   02/23/20 2300  piperacillin-tazobactam (ZOSYN) IVPB 3.375 g  Status:  Discontinued        3.375 g 12.5 mL/hr over 240 Minutes Intravenous Every 8 hours 02/23/20 2220 02/25/20 1528   02/23/20 1200  Ampicillin-Sulbactam (UNASYN) 3 g in sodium chloride 0.9 % 100 mL IVPB  Status:  Discontinued        3 g 200 mL/hr over 30 Minutes Intravenous Every 6 hours 02/23/20 1102 02/23/20 2216          Medications  Scheduled Meds: . aspirin  81 mg Oral Daily  . Chlorhexidine Gluconate Cloth  6 each Topical Q0600  . docusate  100 mg Per Tube BID  .  feeding supplement (GLUCERNA SHAKE)  237 mL Oral BID BM  . furosemide  40 mg Intravenous Daily  . insulin aspart  0-9 Units Subcutaneous Q4H  . insulin glargine  10 Units Subcutaneous BID  . magnesium oxide  400 mg Oral BID  . metoprolol tartrate  50 mg Oral BID  . multivitamin with minerals  1 tablet Oral Daily  . mupirocin ointment  1 application Nasal BID  . pantoprazole  40 mg Oral Q1200  . polyethylene  glycol  17 g Per Tube Daily  . Ensure Max Protein  11 oz Oral QHS  . sodium chloride flush  10-40 mL Intracatheter Q12H  . sodium chloride flush  3 mL Intravenous Q12H   Continuous Infusions: . sodium chloride 10 mL/hr at 03/02/20 1127  . sodium chloride     PRN Meds:.sodium chloride, sodium chloride, alum & mag hydroxide-simeth, nitroGLYCERIN, phenol, sodium chloride flush, sodium chloride flush      Subjective:   Colton Guzman was seen and examined today.  No complaints, sitting up in the chair, wife at the bedside.  Anxious about the surgery.  No acute chest pain, fever/chills, nausea or vomiting. Objective:   Vitals:   03/06/20 2054 03/07/20 0515 03/07/20 0521 03/07/20 1012  BP: 124/82 113/62  109/70  Pulse: 79 62  81  Resp: 18 18    Temp: 98.9 F (37.2 C) 98.5 F (36.9 C)  98.1 F (36.7 C)  TempSrc: Oral Oral  Oral  SpO2: 98% 96%  99%  Weight:   81 kg   Height:        Intake/Output Summary (Last 24 hours) at 03/07/2020 1159 Last data filed at 03/07/2020 0800 Gross per 24 hour  Intake 720 ml  Output 1150 ml  Net -430 ml     Wt Readings from Last 3 Encounters:  03/07/20 81 kg     Exam  General: Alert and oriented x 3, NAD  Cardiovascular: S1 S2 auscultated, no murmurs, RRR  Respiratory: Clear to auscultation bilaterally  Gastrointestinal: Soft, nontender, nondistended, + bowel sounds  Ext: no pedal edema bilaterally  Neuro: no new deficits  Musculoskeletal: No digital cyanosis, clubbing  Skin: No rashes  Psych: Normal affect and  demeanor, alert and oriented x3    Data Reviewed:  I have personally reviewed following labs and imaging studies  Micro Results Recent Results (from the past 240 hour(s))  Cath Tip Culture     Status: None   Collection Time: 03/02/20  6:45 PM   Specimen: Catheter Tip; Other  Result Value Ref Range Status   Specimen Description   Final    CATH TIP Performed at [facility] [address], [place], [place] [address]    Special Requests   Final    NONE Performed at [facility], [place] [address], [place], [place] [address]    Culture   Final    NO GROWTH 3 DAYS Performed at [facility], [place] [address] [place], [place] [address]    Report Status 03/06/2020 FINAL  Final  Surgical PCR screen     Status: None   Collection Time: 03/04/20  6:34 AM   Specimen: Nasal Mucosa; Nasal Swab  Result Value Ref Range Status   MRSA, PCR NEGATIVE NEGATIVE Final   Staphylococcus aureus NEGATIVE NEGATIVE Final    Comment: (NOTE) The Xpert SA Assay (FDA approved for NASAL specimens in patients 57 years of age and older), is one component of a comprehensive surveillance program. It is not intended to diagnose infection nor to guide or monitor treatment. Performed at [facility] [place] [address] [place],  [address]     Radiology Reports DG Chest 1 View  Result Date: 02/23/2020 CLINICAL DATA:  Intubated, central line placement EXAM: CHEST  1 VIEW COMPARISON:  None. FINDINGS: Single frontal view of the chest demonstrates endotracheal tube overlying tracheal air column, tip well above carina. Enteric catheter passes below diaphragm tip excluded by collimation. Right  internal jugular catheter tip projects over superior vena cava. External defibrillator pads overlie the right chest. Cardiac silhouette is unremarkable. Lung volumes are diminished with crowding of the central vasculature. No airspace disease, effusion, or pneumothorax.  No acute bony abnormalities. IMPRESSION: 1. Support devices as above. 2. Low lung volumes with central vascular congestion. Electronically Signed   By: Randa Ngo M.D.   On: 02/23/2020 22:42   DG Abd 1 View  Result Date: 02/27/2020 CLINICAL DATA:  Status post feeding tube placement EXAM: ABDOMEN - 1 VIEW COMPARISON:  February 26, 2020 at 2:37 a.m. FINDINGS: The side port and distal tip of the NG tube in the stomach. No feeding tube identified. IMPRESSION: A feeding tube is not identified. NG tube terminates in the stomach. Electronically Signed   By: Dorise Bullion III M.D   On: 02/27/2020 17:16   DG Abd 1 View  Result Date: 02/23/2020 CLINICAL DATA:  Enteric catheter placement EXAM: ABDOMEN - 1 VIEW COMPARISON:  02/23/2020 FINDINGS: Frontal view of the lower chest and upper abdomen demonstrates tip and side port of an enteric catheter projecting over the gastric antrum. Endotracheal tube and right internal jugular catheter are stable since chest x-ray. Bowel gas pattern is unremarkable. Lung volumes are diminished, with crowding of the central vasculature. IMPRESSION: 1. Enteric catheter overlying gastric antrum. Electronically Signed   By: Randa Ngo M.D.   On: 02/23/2020 23:48   CT HEAD WO CONTRAST  Result Date: 03/01/2020 CLINICAL DATA:  60 year old male with altered mental status. New posturing. Recent respiratory failure, GI bleed, cardiac arrest EXAM: CT HEAD WITHOUT CONTRAST TECHNIQUE: Contiguous axial images were obtained from the base of the skull through the vertex without intravenous contrast. COMPARISON:  Head CT 02/25/2020. FINDINGS: Brain: No midline shift, mass effect, or evidence of intracranial mass lesion. No ventriculomegaly No acute intracranial hemorrhage identified. Gray-white matter differentiation appears stable throughout the brain and within normal limits. No cerebral edema is evident. No cortically based acute infarct identified. Faint basal ganglia dystrophic  calcifications more apparent on the left are stable. Vascular: Extensive Calcified atherosclerosis at the skull base. No suspicious intracranial vascular hyperdensity. Skull: No acute osseous abnormality identified. Sinuses/Orbits: Fairly good sinus and mastoid aeration in the setting of intubation. Mostly resolved fluid in the visible pharynx. Other: No acute orbit or scalp soft tissue finding. IMPRESSION: No acute intracranial abnormality. Stable since 02/25/2020 and largely negative non contrast CT appearance of the brain. Electronically Signed   By: Genevie Ann M.D.   On: 03/01/2020 06:12   CT HEAD WO CONTRAST  Result Date: 02/25/2020 CLINICAL DATA:  Mental status change, possible seizure activity EXAM: CT HEAD WITHOUT CONTRAST TECHNIQUE: Contiguous axial images were obtained from the base of the skull through the vertex without intravenous contrast. COMPARISON:  None. FINDINGS: Brain: Minor brain atrophy pattern without acute intracranial hemorrhage, mass lesion, new infarction, midline shift, herniation, or hydrocephalus. No extra-axial fluid collection. No focal mass effect or edema. Cisterns are patent. Cerebellar atrophy as well. Vascular: Intracranial atherosclerosis at the skull base. No hyperdense vessel. Skull: Normal. Negative for fracture or focal lesion. Sinuses/Orbits: No acute finding. Other: None. IMPRESSION: Mild brain atrophy pattern without acute intracranial abnormality by noncontrast CT. Electronically Signed   By: Jerilynn Mages.  Shick M.D.   On: 02/25/2020 15:21   CT CHEST WO CONTRAST  Result Date: 03/07/2020 CLINICAL DATA:  Preop for CABG, assess for aortopathy EXAM: CT CHEST WITHOUT CONTRAST TECHNIQUE: Multidetector CT imaging of the chest was performed following the standard  protocol without IV contrast. COMPARISON:  CTA abdomen February 22, 2020. FINDINGS: Cardiovascular: Three-vessel coronary artery calcifications. No pericardial effusion. Aortic atherosclerosis. Aortic measurements are as  follows: Ascending aorta measures 3.5 cm, the aortic arch measures 2.8 cm and descending aorta measures 2.4 cm. Aortic atherosclerosis. No pericardial effusion. Mediastinum/Nodes: No enlarged mediastinal or axillary lymph nodes. Thyroid gland, trachea, and esophagus demonstrate no significant findings. Measurements from: -Manubrium to left brachiocephalic, 7 mm -Sternal body to ascending aorta 30 mm -Sternum/xiphoid to right ventricle, 8 mm Lungs/Pleura: Multifocal patchy and nodular ground-glass opacities involving the apical left upper lobe, anterior left upper lobe, and apical right upper lobe. Linear band of atelectasis in the lingula. Small left pleural effusion with adjacent atelectasis. Upper Abdomen: Mild diffuse hepatic steatosis.  Cholelithiasis. Musculoskeletal: Multilevel degenerative changes spine. No suspicious lytic or blastic lesion of bone. No acute osseous abnormality. IMPRESSION: 1. No thoracic aortic aneurysm.  Measurements as above. 2. Multifocal patchy/nodular ground-glass opacities involving the apical left upper lobe, anterior left upper lobe, and apical right upper lobe. Findings are favored to represent an infectious or inflammatory process. Recommend follow-up chest CT in 3 months to ensure resolution. 3. Small left pleural effusion with adjacent atelectasis. 4. Three-vessel coronary artery calcifications and aortic atherosclerosis. Aortic Atherosclerosis (ICD10-I70.0). Electronically Signed   By: Dahlia Bailiff MD   On: 03/07/2020 10:45   CARDIAC CATHETERIZATION  Result Date: 03/04/2020  Prox RCA lesion is 70% stenosed.  Dist RCA lesion is 50% stenosed.  RPDA lesion is 70% stenosed.  3rd RPL lesion is 99% stenosed.  2nd RPL lesion is 99% stenosed.  Mid LAD lesion is 90% stenosed.  Ost LAD to Prox LAD lesion is 70% stenosed.  Ost Cx to Prox Cx lesion is 100% stenosed.  Mid LM to Ost LAD lesion is 60% stenosed.  1. Severe proximal LAD stenosis. Severe mid LAD stenosis. 2.  Complete occlusion of the proximal Circumflex with filling by right to left and left to left collaterals. 3. The RCA is a large dominant vessel with severe proximal stenosis, moderate distal stenosis. Severe stenosis in the posterolateral artery and the PDA. 4. LVEDP 14 Recommendations: He has severe three vessel CAD. Will keep at [facility] and ask CT surgery to consult for CABG.   DG Chest Port 1 View  Result Date: 02/28/2020 CLINICAL DATA:  Acute respiratory failure with hypoxemia. EXAM: PORTABLE CHEST 1 VIEW COMPARISON:  Chest x-rays dated 02/27/2020 and 02/26/2020. FINDINGS: Support apparatus appears stable in position. Heart size and mediastinal contours are stable. Patchy bilateral airspace opacities are not significantly changed. No pleural effusion or pneumothorax is seen. IMPRESSION: Stable chest x-ray. Patchy bilateral airspace opacities, not significantly changed, compatible with multifocal pneumonia versus pulmonary edema. Favor pulmonary edema. Electronically Signed   By: Franki Cabot M.D.   On: 02/28/2020 09:06   DG CHEST PORT 1 VIEW  Result Date: 02/27/2020 CLINICAL DATA:  Respiratory failure EXAM: PORTABLE CHEST 1 VIEW COMPARISON:  02/26/2020 FINDINGS: Endotracheal tube 2.8 cm from the carina. Transesophageal tube tip and side port beyond the margins of imaging, below the GE junction. Right IJ approach central venous catheter tip terminates at the superior cavoatrial junction. Telemetry leads and external support devices overlie the chest. Persistent though slightly improved bilateral perihilar opacities are seen. Some mild right fissural thickening is noted. The pulmonary vascularity remains indistinct. No pneumothorax or visible effusion. Persistent hypoinflation. No acute osseous or soft tissue abnormality. IMPRESSION: 1. Persistent though slightly improved bilateral perihilar opacities likely reflecting some improving pulmonary  edema. 2. Persistent hypoinflation. 3. Support lines and tubes as  above. Electronically Signed   By: Lovena Le M.D.   On: 02/27/2020 04:45   DG Chest Port 1 View  Result Date: 02/26/2020 CLINICAL DATA:  Respiratory failure EXAM: PORTABLE CHEST 1 VIEW COMPARISON:  02/25/2020 FINDINGS: Single frontal view of the chest demonstrates endotracheal tube, tip approximately 2 cm above carina. Enteric catheter passes below diaphragm tip excluded by collimation. Right internal jugular catheter tip projects over superior vena cava. Cardiac silhouette is stable. There is progressive bilateral perihilar airspace disease. No large effusion or pneumothorax. No acute bony abnormalities. IMPRESSION: 1. Support devices as above. 2. Worsening bilateral perihilar airspace disease compatible with progressive edema. Electronically Signed   By: Randa Ngo M.D.   On: 02/26/2020 02:56   DG CHEST PORT 1 VIEW  Result Date: 02/25/2020 CLINICAL DATA:  Cardiac arrest, respiratory failure EXAM: PORTABLE CHEST 1 VIEW COMPARISON:  02/23/2020 FINDINGS: Endotracheal tube is seen 2.7 cm above the carina. Nasogastric tube extends into the upper abdomen beyond the margin of the examination. Right internal jugular central venous catheter tip noted at the superior cavoatrial junction. Lung volumes are small, however, pulmonary insufflation remain stable when compared to prior examination. No pneumothorax or pleural effusion. Mild perihilar interstitial pulmonary infiltrate, likely representing mild cardiogenic failure, appears slightly progressive since prior examination. Cardiac size within normal limits. IMPRESSION: Stable support lines and tubes. Stable pulmonary hypoinflation. Progressive mild perihilar pulmonary edema, likely cardiogenic. Electronically Signed   By: Fidela Salisbury MD   On: 02/25/2020 05:23   DG Abd Portable 1V  Result Date: 02/26/2020 CLINICAL DATA:  Vomiting EXAM: PORTABLE ABDOMEN - 1 VIEW COMPARISON:  02/23/2020 FINDINGS: Supine frontal view of the abdomen and pelvis excludes the  bilateral flanks by collimation. Enteric catheter tip projects over the gastric antrum, stable. Minimal gas and stool throughout the colon to the level of the rectum. No bowel obstruction or ileus. No masses or abnormal calcifications. IMPRESSION: 1. Stable enteric catheter. 2. Unremarkable bowel gas pattern. Electronically Signed   By: Randa Ngo M.D.   On: 02/26/2020 02:54   DG Swallowing Func-Speech Pathology  Result Date: 03/02/2020 Objective Swallowing Evaluation: Type of Study: Bedside Swallow Evaluation  Patient Details Name: Adolf Ormiston MRN: [id number] Date of Birth: 12-Jul-1960 Today's Date: 03/02/2020 Time: SLP Start Time (ACUTE ONLY): 1455 -SLP Stop Time (ACUTE ONLY): 3532 SLP Time Calculation (min) (ACUTE ONLY): 40 min Past Medical History: Past Medical History: Diagnosis Date . Diabetes mellitus without complication ([place])  . Hypertension  Past Surgical History: Past Surgical History: Procedure Laterality Date . COLONOSCOPY WITH PROPOFOL Left 02/27/2020  Procedure: COLONOSCOPY WITH PROPOFOL;  Surgeon: Otis Brace, MD;  Location: [facility];  Service: Gastroenterology;  Laterality: Left; . ESOPHAGOGASTRODUODENOSCOPY (EGD) WITH PROPOFOL N/A 02/27/2020  Procedure: ESOPHAGOGASTRODUODENOSCOPY (EGD) WITH PROPOFOL;  Surgeon: Otis Brace, MD;  Location: [facility];  Service: Gastroenterology;  Laterality: N/A; HPI: pt is a 60 yo male adm with GI bleed.  Pt underwent endoscopy and after had AMS, ? seizure activity with cardiac arrest requiring cardioversion and CPR.  Intubated 1/5 to 03/01/2020. Swallow eval ordered.  Pt also has h/o DM.  His CXR showed multifocal pna vs edema.  He is currently on room air.  Pt admits to some issues with swallowing prior to admit but does not expand on information.  Subjective: pt awake in chair Assessment / Plan / Recommendation CHL IP CLINICAL IMPRESSIONS 03/02/2020 Clinical Impression Min oral and mild pharyngeal dysphagia present likely due  to potential edema  from pt's prolonged intubation.  He demonstrates retention of secretions in pharynx without sensation.  Mildly impaired tongue base retraction, laryngeal elevation/closure allows laryngeal penetration of liquids and vallecular retention.  Pt denies sensation to retention however will frequently propel vallecular residual into oral cavity and reswallow.  No aspiration but pt demonstrates laryngeal penetration to cords with thin.  Cued cough/throat clearing removed trace penetration.  Pharyngeal retention worse with solid than liquids - thus recommend start with full liquid diet with strict precautions.  SlP will follow up next date for tolerance and readinesss for dietary advancement.  Using teach back with live feed, pt educated to recommendations and precautions.  Of note, pt did NOT cough during the MBS, but overtly coughed and expectorated viscous secretions *minimal blood tinged* after swallowing water.  Anticipate acute dysphagia to improve with decreased pharyngeal edema and conditioning. SLP Visit Diagnosis Dysphagia, pharyngeal phase (R13.13);Dysphagia, pharyngoesophageal phase (R13.14) Attention and concentration deficit following -- Frontal lobe and executive function deficit following -- Impact on safety and function Mild aspiration risk   CHL IP TREATMENT RECOMMENDATION 03/02/2020 Treatment Recommendations Therapy as outlined in treatment plan below   Prognosis 03/02/2020 Prognosis for Safe Diet Advancement Fair Barriers to Reach Goals -- Barriers/Prognosis Comment -- CHL IP DIET RECOMMENDATION 03/02/2020 SLP Diet Recommendations Thin liquid Liquid Administration via -- Medication Administration Whole meds with puree Compensations Slow rate;Small sips/bites;Other (Comment) Postural Changes --   CHL IP OTHER RECOMMENDATIONS 03/02/2020 Recommended Consults -- Oral Care Recommendations Oral care QID Other Recommendations --   CHL IP FOLLOW UP RECOMMENDATIONS 03/02/2020 Follow up Recommendations (No Data)   CHL  IP FREQUENCY AND DURATION 03/02/2020 Speech Therapy Frequency (ACUTE ONLY) min 1 x/week Treatment Duration 1 week      CHL IP ORAL PHASE 03/02/2020 Oral Phase Impaired Oral - Pudding Teaspoon -- Oral - Pudding Cup -- Oral - Honey Teaspoon -- Oral - Honey Cup -- Oral - Nectar Teaspoon -- Oral - Nectar Cup WFL Oral - Nectar Straw WFL Oral - Thin Teaspoon WFL Oral - Thin Cup WFL Oral - Thin Straw WFL Oral - Puree Lingual pumping Oral - Mech Soft Lingual pumping Oral - Regular -- Oral - Multi-Consistency -- Oral - Pill -- Oral Phase - Comment pt propels boluses from pharynx/pharyngeal tongue base into oral cavity and re-swallows  CHL IP PHARYNGEAL PHASE 03/02/2020 Pharyngeal Phase Impaired Pharyngeal- Pudding Teaspoon -- Pharyngeal -- Pharyngeal- Pudding Cup -- Pharyngeal -- Pharyngeal- Honey Teaspoon -- Pharyngeal -- Pharyngeal- Honey Cup -- Pharyngeal -- Pharyngeal- Nectar Teaspoon Reduced tongue base retraction;Reduced epiglottic inversion;Reduced laryngeal elevation Pharyngeal Material does not enter airway Pharyngeal- Nectar Cup -- Pharyngeal -- Pharyngeal- Nectar Straw Reduced tongue base retraction;Penetration/Aspiration during swallow;Reduced epiglottic inversion;Reduced laryngeal elevation Pharyngeal Material enters airway, remains ABOVE vocal cords and not ejected out Pharyngeal- Thin Teaspoon Reduced tongue base retraction;Reduced epiglottic inversion Pharyngeal Material enters airway, remains ABOVE vocal cords and not ejected out Pharyngeal- Thin Cup Reduced epiglottic inversion;Reduced laryngeal elevation;Reduced airway/laryngeal closure;Reduced tongue base retraction;Penetration/Aspiration during swallow;Penetration/Apiration after swallow Pharyngeal Material enters airway, CONTACTS cords and not ejected out Pharyngeal- Thin Straw Reduced epiglottic inversion;Reduced laryngeal elevation Pharyngeal Material enters airway, passes BELOW cords without attempt by patient to eject out (silent aspiration)  Pharyngeal- Puree Reduced epiglottic inversion;Reduced tongue base retraction;Pharyngeal residue - valleculae;Reduced laryngeal elevation Pharyngeal Material does not enter airway Pharyngeal- Mechanical Soft Reduced epiglottic inversion;Reduced laryngeal elevation;Reduced tongue base retraction;Pharyngeal residue - valleculae Pharyngeal Material does not enter airway Pharyngeal- Regular -- Pharyngeal -- Pharyngeal- Multi-consistency --  Pharyngeal -- Pharyngeal- Pill WFL Pharyngeal Material does not enter airway Pharyngeal Comment Inconsistent strength of swallow - pharyngeal clearance - Cues for effortful swallow did not contribute to prevention of retention (approx 30% retained of pudding bolus alone) but tablet with puree (whole) easily cleared (100%).  Pt does not sense retention in pharynx however he will propel vallecular retention into oral cavity and re-swallow *without awareness. Chin down posture not helpful and SlP did not head turn due to appearance of approx one inch of IJ line out of neck. SLP phoned RN regarding concern - covered bandage with transpore and proceeded with testing as advised per RN. Liquid was appears helpful to decrease retention.  CHL IP CERVICAL ESOPHAGEAL PHASE 03/02/2020 Cervical Esophageal Phase WFL Pudding Teaspoon -- Pudding Cup -- Honey Teaspoon -- Honey Cup -- Nectar Teaspoon -- Nectar Cup -- Nectar Straw -- Thin Teaspoon -- Thin Cup -- Thin Straw -- Puree -- Mechanical Soft -- Regular -- Multi-consistency -- Pill -- Cervical Esophageal Comment -- Kathleen Lime, MS [facility] SLP Acute Rehab Services Office [phone] Pager [phone] Macario Golds 03/02/2020, 4:12 PM              EEG adult  Result Date: 02/26/2020 Lora Havens, MD     02/26/2020  8:41 AM Patient Name: Dymond Spreen MRN: [id number] Epilepsy Attending: Lora Havens Referring Physician/Provider: Noe Gens, NP Date: 02/25/2020 Duration: 23.22 mins Patient history: 60yo M with seizure like activity. EEG to  evaluate for seizure Level of alertness:  comatose AEDs during EEG study: LEV Technical aspects: This EEG study was done with scalp electrodes positioned according to the 10-20 International system of electrode placement. Electrical activity was acquired at a sampling rate of 500Hz  and reviewed with a high frequency filter of 70Hz  and a low frequency filter of 1Hz . EEG data were recorded continuously and digitally stored. Description: EEG showed continuous generalized 6-7Hz  theta slowing as well as intermittent 2-3hz  delta slowing.  Hyperventilation and photic stimulation were not performed.   ABNORMALITY -Continuous slow, generalized IMPRESSION: This study is suggestive of moderate to severe diffuse encephalopathy, nonspecific etiology. No seizures or epileptiform discharges were seen throughout the recording. Lora Havens   ECHOCARDIOGRAM COMPLETE  Result Date: 02/25/2020    ECHOCARDIOGRAM REPORT   Patient Name:   JEDADIAH ABDALLAH Date of Exam: 02/25/2020 Medical Rec #:  [id number]      Height:       68.0 in Accession #:    [id number]     Weight:       194.4 lb Date of Birth:  [birth year]       BSA:          2.019 m Patient Age:    63 years       BP:           103/61 mmHg Patient Gender: M              HR:           97 bpm. Exam Location:  Inpatient Procedure: 2D Echo, Color Doppler, Cardiac Doppler and Intracardiac            Opacification Agent Indications:    Cardiac arrest I46.9  History:        Patient has no prior history of Echocardiogram examinations.                 Signs/Symptoms:Fever; Risk Factors:Hypertension, Diabetes and  Dyslipidemia. Acute GI blood loss, presumed hyporperfusion. ECG                 just prior and then post resuscitation with anterior ST                 elevations.                 A Fib + RVR post resuscitation                 Cardiogenic Shock.  Sonographer:    Darlina Sicilian RDCS Referring Phys: [id number] Donita Brooks  Sonographer Comments: Echo performed with  patient supine and on artificial respirator. IMPRESSIONS  1. LVEF 50-55% with wall motion abnormalities suspicious for mid LAD. No apical thrombus.  2. Left ventricular ejection fraction, by estimation, is 50 to 55%. The left ventricle has low normal function. The left ventricle demonstrates regional wall motion abnormalities (see scoring diagram/findings for description). Left ventricular diastolic  parameters are consistent with Grade II diastolic dysfunction (pseudonormalization). Elevated left atrial pressure.  3. Right ventricular systolic function is normal. The right ventricular size is normal. There is moderately elevated pulmonary artery systolic pressure. The estimated right ventricular systolic pressure is 81.7 mmHg.  4. Left atrial size was mildly dilated.  5. The mitral valve is normal in structure. Moderate mitral valve regurgitation. No evidence of mitral stenosis.  6. The aortic valve is normal in structure. Aortic valve regurgitation is not visualized. Mild to moderate aortic valve sclerosis/calcification is present, without any evidence of aortic stenosis.  7. The inferior vena cava is normal in size with greater than 50% respiratory variability, suggesting right atrial pressure of 3 mmHg. FINDINGS  Left Ventricle: Left ventricular ejection fraction, by estimation, is 50 to 55%. The left ventricle has low normal function. The left ventricle demonstrates regional wall motion abnormalities. Definity contrast agent was given IV to delineate the left ventricular endocardial borders. The left ventricular internal cavity size was normal in size. There is no left ventricular hypertrophy. Left ventricular diastolic parameters are consistent with Grade II diastolic dysfunction (pseudonormalization). Elevated left atrial pressure.  LV Wall Scoring: The mid and distal anterior wall, mid and distal anterior septum, and apex are hypokinetic. Right Ventricle: The right ventricular size is normal. No increase in  right ventricular wall thickness. Right ventricular systolic function is normal. There is moderately elevated pulmonary artery systolic pressure. The tricuspid regurgitant velocity is 3.19 m/s, and with an assumed right atrial pressure of 8 mmHg, the estimated right ventricular systolic pressure is 71.1 mmHg. Left Atrium: Left atrial size was mildly dilated. Right Atrium: Right atrial size was normal in size. Pericardium: There is no evidence of pericardial effusion. Mitral Valve: The mitral valve is normal in structure. Moderate mitral valve regurgitation. No evidence of mitral valve stenosis. Tricuspid Valve: The tricuspid valve is normal in structure. Tricuspid valve regurgitation is mild . No evidence of tricuspid stenosis. Aortic Valve: The aortic valve is normal in structure. Aortic valve regurgitation is not visualized. Mild to moderate aortic valve sclerosis/calcification is present, without any evidence of aortic stenosis. Pulmonic Valve: The pulmonic valve was normal in structure. Pulmonic valve regurgitation is not visualized. No evidence of pulmonic stenosis. Aorta: The aortic root is normal in size and structure. Venous: The inferior vena cava is normal in size with greater than 50% respiratory variability, suggesting right atrial pressure of 3 mmHg. IAS/Shunts: No atrial level shunt detected by color flow Doppler.  LEFT VENTRICLE PLAX 2D  LVIDd:         4.80 cm      Diastology LVIDs:         3.70 cm      LV e' medial:    5.10 cm/s LV PW:         0.70 cm      LV E/e' medial:  18.1 LV IVS:        1.00 cm      LV e' lateral:   7.16 cm/s LVOT diam:     1.80 cm      LV E/e' lateral: 12.9 LV SV:         19 LV SV Index:   9 LVOT Area:     2.54 cm  LV Volumes (MOD) LV vol d, MOD A2C: 144.0 ml LV vol d, MOD A4C: 112.0 ml LV vol s, MOD A2C: 55.8 ml LV vol s, MOD A4C: 54.3 ml LV SV MOD A2C:     88.2 ml LV SV MOD A4C:     112.0 ml LV SV MOD BP:      75.2 ml RIGHT VENTRICLE RV S prime:     7.80 cm/s TAPSE (M-mode):  1.5 cm LEFT ATRIUM             Index LA diam:        4.00 cm 1.98 cm/m LA Vol (A2C):   37.2 ml 18.42 ml/m LA Vol (A4C):   28.9 ml 14.31 ml/m LA Biplane Vol: 35.2 ml 17.43 ml/m  AORTIC VALVE LVOT Vmax:   61.40 cm/s LVOT Vmean:  45.800 cm/s LVOT VTI:    0.075 m  AORTA Ao Root diam: 2.80 cm MITRAL VALVE               TRICUSPID VALVE MV Area (PHT): 0.16 cm    TR Peak grad:   40.7 mmHg MV Decel Time: 4870 msec   TR Vmax:        319.00 cm/s MV E velocity: 92.55 cm/s MV A velocity: 44.10 cm/s  SHUNTS MV E/A ratio:  2.10        Systemic VTI:  0.08 m                            Systemic Diam: 1.80 cm Ena Dawley MD Electronically signed by Ena Dawley MD Signature Date/Time: 02/25/2020/1:23:40 PM    Final    VAS US DOPPLER PRE CABG  Result Date: 03/05/2020 PREOPERATIVE VASCULAR EVALUATION  Indications:      Pre-CABG. Risk Factors:     Hypertension, hyperlipidemia, Diabetes, coronary artery                   disease. Comparison Study: No prior study Performing Technologist: Sharion Dove RVS  Examination Guidelines: A complete evaluation includes B-mode imaging, spectral Doppler, color Doppler, and power Doppler as needed of all accessible portions of each vessel. Bilateral testing is considered an integral part of a complete examination. Limited examinations for reoccurring indications may be performed as noted.  Right Carotid Findings: +----------+--------+--------+--------+------------+--------+           PSV cm/sEDV cm/sStenosisDescribe    Comments +----------+--------+--------+--------+------------+--------+ CCA Prox  66      15                                   +----------+--------+--------+--------+------------+--------+ CCA Distal67  16                                   +----------+--------+--------+--------+------------+--------+ ICA Prox  57      9               heterogenous         +----------+--------+--------+--------+------------+--------+ ICA Distal62      22                                    +----------+--------+--------+--------+------------+--------+ ECA       143     10                                   +----------+--------+--------+--------+------------+--------+ Portions of this table do not appear on this page. +----------+--------+-------+--------+------------+           PSV cm/sEDV cmsDescribeArm Pressure +----------+--------+-------+--------+------------+ Subclavian90                                  +----------+--------+-------+--------+------------+ +---------+--------+--+--------+--+ VertebralPSV cm/s67EDV cm/s15 +---------+--------+--+--------+--+ Left Carotid Findings: +----------+--------+--------+--------+------------+------------------+           PSV cm/sEDV cm/sStenosisDescribe    Comments           +----------+--------+--------+--------+------------+------------------+ CCA Prox  93      18                                             +----------+--------+--------+--------+------------+------------------+ CCA Distal83      20                          intimal thickening +----------+--------+--------+--------+------------+------------------+ ICA Prox  58      18              heterogenous                   +----------+--------+--------+--------+------------+------------------+ ICA Distal92      36                                             +----------+--------+--------+--------+------------+------------------+ ECA       146     12                                             +----------+--------+--------+--------+------------+------------------+ +----------+--------+--------+--------+------------+ SubclavianPSV cm/sEDV cm/sDescribeArm Pressure +----------+--------+--------+--------+------------+           229                                  +----------+--------+--------+--------+------------+ +---------+--------+--+--------+-+ VertebralPSV cm/s11EDV cm/s3  +---------+--------+--+--------+-+  ABI Findings: +--------+------------------+-----+-----------+--------+ Right   Rt Pressure (mmHg)IndexWaveform   Comment  +--------+------------------+-----+-----------+--------+ Brachial  triphasic  line     +--------+------------------+-----+-----------+--------+ PTA     166               1.10 multiphasic         +--------+------------------+-----+-----------+--------+ DP      157               1.04 multiphasic         +--------+------------------+-----+-----------+--------+ +--------+------------------+-----+-----------+-------+ Left    Lt Pressure (mmHg)IndexWaveform   Comment +--------+------------------+-----+-----------+-------+ [id number]                    triphasic          +--------+------------------+-----+-----------+-------+ PTA     160               1.06 multiphasic        +--------+------------------+-----+-----------+-------+ DP      169               1.12 multiphasic        +--------+------------------+-----+-----------+-------+ +-------+---------------+----------------+ ABI/TBIToday's ABI/TBIPrevious ABI/TBI +-------+---------------+----------------+ Right  1.10                            +-------+---------------+----------------+ Left   1.12                            +-------+---------------+----------------+  Right Doppler Findings: +-----------+--------+-----+-----------+---------------------------------------+ Site       PressureIndexDoppler    Comments                                +-----------+--------+-----+-----------+---------------------------------------+ Brachial                triphasic  line                                    +-----------+--------+-----+-----------+---------------------------------------+ Radial                  multiphasic                                         +-----------+--------+-----+-----------+---------------------------------------+ Ulnar                   multiphasic                                        +-----------+--------+-----+-----------+---------------------------------------+ Palmar Arch                        Doppler signal reverses with radial                                        compression                             +-----------+--------+-----+-----------+---------------------------------------+  Left Doppler Findings: +-----------+--------+-----+-----------+---------------------------------------+ Site       PressureIndexDoppler    Comments                                +-----------+--------+-----+-----------+---------------------------------------+  Brachial   151          triphasic                                          +-----------+--------+-----+-----------+---------------------------------------+ Radial                  multiphasic                                        +-----------+--------+-----+-----------+---------------------------------------+ Ulnar                   multiphasic                                        +-----------+--------+-----+-----------+---------------------------------------+ Palmar Arch                        Doppler signal reverses with ulnar                                         compression                             +-----------+--------+-----+-----------+---------------------------------------+  Summary: Right Carotid: The extracranial vessels were near-normal with only minimal wall                thickening or plaque. Left Carotid: The extracranial vessels were near-normal with only minimal wall               thickening or plaque. Vertebrals:  Bilateral vertebral arteries demonstrate antegrade flow. Subclavians: Normal flow hemodynamics were seen in bilateral subclavian              arteries. Right ABI: Resting right ankle-brachial index is within  normal range. No evidence of significant right lower extremity arterial disease. Left ABI: Resting left ankle-brachial index is within normal range. No evidence of significant left lower extremity arterial disease. Right Upper Extremity: Doppler waveforms decrease <50% with right radial compression. Doppler waveform obliterate with right ulnar compression. Left Upper Extremity: Doppler waveforms remain within normal limits with left radial compression. Doppler waveforms decrease <50% with left ulnar compression.  Electronically signed by Servando Snare MD on 03/05/2020 at 11:05:23 PM.    Final    [place] EKG SITE RITE  Result Date: 03/01/2020 If Site Rite image not attached, placement could not be confirmed due to current cardiac rhythm.  CT Angio Abd/Pel w/ and/or w/o  Result Date: 02/22/2020 CLINICAL DATA:  GI bleeding. EXAM: CTA ABDOMEN AND PELVIS WITHOUT AND WITH CONTRAST TECHNIQUE: Multidetector CT imaging of the abdomen and pelvis was performed using the standard protocol during bolus administration of intravenous contrast. Multiplanar reconstructed images and MIPs were obtained and reviewed to evaluate the vascular anatomy. CONTRAST:  157mL OMNIPAQUE IOHEXOL 350 MG/ML SOLN COMPARISON:  None. FINDINGS: VASCULAR Aorta: There are atherosclerotic changes of the abdominal aorta without evidence for an aneurysm. Celiac: Patent without evidence of aneurysm, dissection, vasculitis or significant stenosis. SMA: Patent without evidence of aneurysm, dissection, vasculitis or significant stenosis. Renals: Both renal arteries are  patent without evidence of aneurysm, dissection, vasculitis, fibromuscular dysplasia or significant stenosis. IMA: Patent without evidence of aneurysm, dissection, vasculitis or significant stenosis. Inflow: Patent without evidence of aneurysm, dissection, vasculitis or significant stenosis. Proximal Outflow: Bilateral common femoral and visualized portions of the superficial and profunda femoral  arteries are patent without evidence of aneurysm, dissection, vasculitis or significant stenosis. Veins: No obvious venous abnormality within the limitations of this arterial phase study. Review of the MIP images confirms the above findings. NON-VASCULAR Lower chest: The lung bases are clear. The heart size is normal. Hepatobiliary: There is decreased hepatic attenuation suggestive of hepatic steatosis. Cholelithiasis without acute inflammation.There is no biliary ductal dilation. Pancreas: Normal contours without ductal dilatation. No peripancreatic fluid collection. Spleen: Unremarkable. Adrenals/Urinary Tract: --Adrenal glands: Unremarkable. --Right kidney/ureter: No hydronephrosis or radiopaque kidney stones. --Left kidney/ureter: No hydronephrosis or radiopaque kidney stones. --Urinary bladder: Unremarkable. Stomach/Bowel: --Stomach/Duodenum: No hiatal hernia or other gastric abnormality. Normal duodenal course and caliber. --Small bowel: Unremarkable. --Colon: There is some mild circumferential wall thickening of the descending colon. There is no evidence for active GI bleeding within the colon. There are scattered colonic diverticula. --Appendix: Normal. Vascular/Lymphatic: Atherosclerotic calcification is present within the non-aneurysmal abdominal aorta, without hemodynamically significant stenosis. --No retroperitoneal lymphadenopathy. --No mesenteric lymphadenopathy. --No pelvic or inguinal lymphadenopathy. Reproductive: Unremarkable Other: No ascites or free air. There are bilateral fat containing inguinal hernias. The left inguinal hernia contains a loop of the sigmoid colon. The visualized portions of the sigmoid colon within this hernia are relatively unremarkable. Not all of the herniated bowel is visualized on this study. Musculoskeletal. No acute displaced fractures. IMPRESSION: 1. No evidence for active GI bleeding. 2. Mild circumferential wall thickening of the descending colon may be secondary  to infectious or inflammatory colitis. 3. Bilateral fat containing inguinal hernias. The left inguinal hernia contains a loop of the sigmoid colon without evidence for obstruction. 4. Cholelithiasis without acute inflammation. 5. Hepatic steatosis. Aortic Atherosclerosis (ICD10-I70.0). Electronically Signed   By: Constance Holster M.D.   On: 02/22/2020 21:13    Lab Data:  CBC: Recent Labs  Lab 02/29/20 1747 03/01/20 0624 03/03/20 0610 03/05/20 0319 03/07/20 1029  WBC 11.0* 11.6* 7.9 6.7 7.8  HGB 9.3* 10.0* 11.3* 11.4* 12.4*  HCT 28.9* 30.7* 35.8* 35.8* 37.4*  MCV 92.6 91.9 94.0 92.5 91.2  PLT 135* 167 234 266 010   Basic Metabolic Panel: Recent Labs  Lab 03/01/20 0624 03/01/20 2006 03/02/20 0446 03/03/20 0610 03/04/20 0600 03/05/20 0319 03/06/20 0339 03/07/20 1029  NA 138   < > 142 139 138 135 136 133*  K 3.3*   < > 3.3* 3.2* 3.9 3.8 4.4 4.1  CL 108   < > 102 100 103 100 103 101  CO2 21*   < > 27 29 25 24 26  21*  GLUCOSE 276*   < > 158* 168* 167* 151* 162* 290*  BUN 13   < > 8 6 6 6 8 12   CREATININE 0.84   < > 0.78 0.80 0.78 0.74 0.87 0.90  CALCIUM 7.5*   < > 7.7* 8.2* 8.6* 8.4* 8.5* 8.7*  MG 2.0  --  1.8 2.3 2.1  --  2.3 2.3  PHOS 2.5  --  2.8 3.0 3.1  --   --   --    < > = values in this interval not displayed.   GFR: Estimated Creatinine Clearance: 87.6 mL/min (by C-G formula based on SCr of 0.9 mg/dL). Liver Function Tests: Recent Labs  Lab 03/01/20 0624 03/03/20 0610 03/06/20 0339 03/07/20 1029  AST 46* 39 47* 63*  ALT 167* 111* 72* 80*  ALKPHOS 75 77 83 95  BILITOT 1.1 1.2 0.9 1.2  PROT 5.8* 6.6 6.7 7.6  ALBUMIN 2.3* 2.6* 2.6* 3.1*   No results for input(s): LIPASE, AMYLASE in the last 168 hours. No results for input(s): AMMONIA in the last 168 hours. Coagulation Profile: Recent Labs  Lab 03/06/20 0339  INR 1.2   Cardiac Enzymes: No results for input(s): CKTOTAL, CKMB, CKMBINDEX, TROPONINI in the last 168 hours. BNP (last 3 results) No  results for input(s): PROBNP in the last 8760 hours. HbA1C: No results for input(s): HGBA1C in the last 72 hours. CBG: Recent Labs  Lab 03/06/20 1611 03/06/20 2037 03/07/20 0023 03/07/20 0512 03/07/20 0818  GLUCAP 145* 177* 135* 160* 135*   Lipid Profile: No results for input(s): CHOL, HDL, LDLCALC, TRIG, CHOLHDL, LDLDIRECT in the last 72 hours. Thyroid Function Tests: No results for input(s): TSH, T4TOTAL, FREET4, T3FREE, THYROIDAB in the last 72 hours. Anemia Panel: No results for input(s): VITAMINB12, FOLATE, FERRITIN, TIBC, IRON, RETICCTPCT in the last 72 hours. Urine analysis:    Component Value Date/Time   COLORURINE STRAW (A) 02/24/2020 1442   APPEARANCEUR TURBID (A) 02/24/2020 1442   LABSPEC 1.028 02/24/2020 1442   PHURINE 5.0 02/24/2020 1442   GLUCOSEU >=500 (A) 02/24/2020 1442   HGBUR SMALL (A) 02/24/2020 1442   BILIRUBINUR NEGATIVE 02/24/2020 1442   KETONESUR 20 (A) 02/24/2020 1442   PROTEINUR NEGATIVE 02/24/2020 1442   NITRITE NEGATIVE 02/24/2020 1442   LEUKOCYTESUR NEGATIVE 02/24/2020 1442     Anshika Pethtel M.D. [facility] 03/07/2020, 11:59 AM   Call night coverage person covering after 7pm

## 2020-03-07 NOTE — Progress Notes (Signed)
CARDIAC REHAB PHASE I   PRE:  Rate/Rhythm: 86 SR  BP:  Supine:   Sitting: 112/72  Standing:    SaO2: 99%RA  MODE:  Ambulation: 320 ft   POST:  Rate/Rhythm: 85  BP:  Supine:   Sitting: 109/73  Standing:    SaO2: 99%RA 0836-0909 Pt walked 320 ft on RA with steady gait. Tolerated well. Denied CP. Stated tired by end of walk. Answered questions of pt and wife. Gave [facility] booklet. Wrote down how to view pre op video. Phone not working and no volume on TV. Notified staff so pt can watch video later when these are fixed. Discussed sternal precautions and importance of IS. Will follow up after surgery. Graylon Good RN BSN 03/07/2020 9:08 AM     Graylon Good, RN BSN  03/07/2020 9:04 AM

## 2020-03-08 ENCOUNTER — Inpatient Hospital Stay (HOSPITAL_COMMUNITY): Payer: Managed Care, Other (non HMO) | Admitting: Anesthesiology

## 2020-03-08 ENCOUNTER — Inpatient Hospital Stay (HOSPITAL_COMMUNITY)
Admission: EM | Disposition: A | Discharge status: Home / Self Care | Payer: Self-pay | Source: Home / Self Care | Attending: Pulmonary Disease

## 2020-03-08 ENCOUNTER — Inpatient Hospital Stay (HOSPITAL_COMMUNITY): Payer: Managed Care, Other (non HMO)

## 2020-03-08 DIAGNOSIS — Z951 Presence of aortocoronary bypass graft: Secondary | ICD-10-CM

## 2020-03-08 DIAGNOSIS — I469 Cardiac arrest, cause unspecified: Secondary | ICD-10-CM | POA: Diagnosis not present

## 2020-03-08 DIAGNOSIS — I214 Non-ST elevation (NSTEMI) myocardial infarction: Secondary | ICD-10-CM | POA: Diagnosis not present

## 2020-03-08 DIAGNOSIS — I2510 Atherosclerotic heart disease of native coronary artery without angina pectoris: Secondary | ICD-10-CM

## 2020-03-08 HISTORY — PX: CORONARY ARTERY BYPASS GRAFT: SHX141

## 2020-03-08 HISTORY — PX: RADIAL ARTERY HARVEST: SHX5067

## 2020-03-08 HISTORY — PX: ENDOVEIN HARVEST OF GREATER SAPHENOUS VEIN: SHX5059

## 2020-03-08 HISTORY — PX: TEE WITHOUT CARDIOVERSION: SHX5443

## 2020-03-08 LAB — POCT I-STAT, CHEM 8
BUN: 12 mg/dL (ref 6–20)
BUN: 11 mg/dL (ref 6–20)
BUN: 10 mg/dL (ref 6–20)
BUN: 8 mg/dL (ref 6–20)
BUN: 8 mg/dL (ref 6–20)
BUN: 8 mg/dL (ref 6–20)
Calcium, Ion: 1.27 mmol/L (ref 1.15–1.40)
Calcium, Ion: 1.26 mmol/L (ref 1.15–1.40)
Calcium, Ion: 1.06 mmol/L — ABNORMAL LOW (ref 1.15–1.40)
Calcium, Ion: 0.98 mmol/L — ABNORMAL LOW (ref 1.15–1.40)
Calcium, Ion: 0.97 mmol/L — ABNORMAL LOW (ref 1.15–1.40)
Calcium, Ion: 1.22 mmol/L (ref 1.15–1.40)
Chloride: 103 mmol/L (ref 98–111)
Chloride: 105 mmol/L (ref 98–111)
Chloride: 101 mmol/L (ref 98–111)
Chloride: 103 mmol/L (ref 98–111)
Chloride: 102 mmol/L (ref 98–111)
Chloride: 106 mmol/L (ref 98–111)
Creatinine, Ser: 0.7 mg/dL (ref 0.61–1.24)
Creatinine, Ser: 0.7 mg/dL (ref 0.61–1.24)
Creatinine, Ser: 0.6 mg/dL — ABNORMAL LOW (ref 0.61–1.24)
Creatinine, Ser: 0.5 mg/dL — ABNORMAL LOW (ref 0.61–1.24)
Creatinine, Ser: 0.5 mg/dL — ABNORMAL LOW (ref 0.61–1.24)
Creatinine, Ser: 0.5 mg/dL — ABNORMAL LOW (ref 0.61–1.24)
Glucose, Bld: 149 mg/dL — ABNORMAL HIGH (ref 70–99)
Glucose, Bld: 135 mg/dL — ABNORMAL HIGH (ref 70–99)
Glucose, Bld: 120 mg/dL — ABNORMAL HIGH (ref 70–99)
Glucose, Bld: 149 mg/dL — ABNORMAL HIGH (ref 70–99)
Glucose, Bld: 152 mg/dL — ABNORMAL HIGH (ref 70–99)
Glucose, Bld: 148 mg/dL — ABNORMAL HIGH (ref 70–99)
HCT: 37 % — ABNORMAL LOW (ref 39.0–52.0)
HCT: 33 % — ABNORMAL LOW (ref 39.0–52.0)
HCT: 26 % — ABNORMAL LOW (ref 39.0–52.0)
HCT: 27 % — ABNORMAL LOW (ref 39.0–52.0)
HCT: 24 % — ABNORMAL LOW (ref 39.0–52.0)
HCT: 26 % — ABNORMAL LOW (ref 39.0–52.0)
Hemoglobin: 12.6 g/dL — ABNORMAL LOW (ref 13.0–17.0)
Hemoglobin: 11.2 g/dL — ABNORMAL LOW (ref 13.0–17.0)
Hemoglobin: 8.8 g/dL — ABNORMAL LOW (ref 13.0–17.0)
Hemoglobin: 9.2 g/dL — ABNORMAL LOW (ref 13.0–17.0)
Hemoglobin: 8.2 g/dL — ABNORMAL LOW (ref 13.0–17.0)
Hemoglobin: 8.8 g/dL — ABNORMAL LOW (ref 13.0–17.0)
Potassium: 3.9 mmol/L (ref 3.5–5.1)
Potassium: 4.2 mmol/L (ref 3.5–5.1)
Potassium: 4.2 mmol/L (ref 3.5–5.1)
Potassium: 4.1 mmol/L (ref 3.5–5.1)
Potassium: 4.1 mmol/L (ref 3.5–5.1)
Potassium: 3.9 mmol/L (ref 3.5–5.1)
Sodium: 138 mmol/L (ref 135–145)
Sodium: 139 mmol/L (ref 135–145)
Sodium: 137 mmol/L (ref 135–145)
Sodium: 140 mmol/L (ref 135–145)
Sodium: 141 mmol/L (ref 135–145)
Sodium: 141 mmol/L (ref 135–145)
TCO2: 27 mmol/L (ref 22–32)
TCO2: 25 mmol/L (ref 22–32)
TCO2: 25 mmol/L (ref 22–32)
TCO2: 26 mmol/L (ref 22–32)
TCO2: 25 mmol/L (ref 22–32)
TCO2: 25 mmol/L (ref 22–32)

## 2020-03-08 LAB — POCT I-STAT 7, (LYTES, BLD GAS, ICA,H+H)
Acid-Base Excess: 1 mmol/L (ref 0.0–2.0)
Acid-Base Excess: 2 mmol/L (ref 0.0–2.0)
Acid-Base Excess: 2 mmol/L (ref 0.0–2.0)
Acid-Base Excess: 0 mmol/L (ref 0.0–2.0)
Acid-Base Excess: 0 mmol/L (ref 0.0–2.0)
Acid-base deficit: 1 mmol/L (ref 0.0–2.0)
Acid-base deficit: 2 mmol/L (ref 0.0–2.0)
Bicarbonate: 25.1 mmol/L (ref 20.0–28.0)
Bicarbonate: 25.7 mmol/L (ref 20.0–28.0)
Bicarbonate: 25.7 mmol/L (ref 20.0–28.0)
Bicarbonate: 25.8 mmol/L (ref 20.0–28.0)
Bicarbonate: 25.5 mmol/L (ref 20.0–28.0)
Bicarbonate: 24.4 mmol/L (ref 20.0–28.0)
Bicarbonate: 23 mmol/L (ref 20.0–28.0)
Calcium, Ion: 0.92 mmol/L — ABNORMAL LOW (ref 1.15–1.40)
Calcium, Ion: 0.94 mmol/L — ABNORMAL LOW (ref 1.15–1.40)
Calcium, Ion: 0.94 mmol/L — ABNORMAL LOW (ref 1.15–1.40)
Calcium, Ion: 1.23 mmol/L (ref 1.15–1.40)
Calcium, Ion: 1.17 mmol/L (ref 1.15–1.40)
Calcium, Ion: 1.09 mmol/L — ABNORMAL LOW (ref 1.15–1.40)
Calcium, Ion: 1.1 mmol/L — ABNORMAL LOW (ref 1.15–1.40)
HCT: 24 % — ABNORMAL LOW (ref 39.0–52.0)
HCT: 25 % — ABNORMAL LOW (ref 39.0–52.0)
HCT: 25 % — ABNORMAL LOW (ref 39.0–52.0)
HCT: 24 % — ABNORMAL LOW (ref 39.0–52.0)
HCT: 29 % — ABNORMAL LOW (ref 39.0–52.0)
HCT: 26 % — ABNORMAL LOW (ref 39.0–52.0)
HCT: 25 % — ABNORMAL LOW (ref 39.0–52.0)
Hemoglobin: 8.2 g/dL — ABNORMAL LOW (ref 13.0–17.0)
Hemoglobin: 8.5 g/dL — ABNORMAL LOW (ref 13.0–17.0)
Hemoglobin: 8.5 g/dL — ABNORMAL LOW (ref 13.0–17.0)
Hemoglobin: 8.2 g/dL — ABNORMAL LOW (ref 13.0–17.0)
Hemoglobin: 9.9 g/dL — ABNORMAL LOW (ref 13.0–17.0)
Hemoglobin: 8.8 g/dL — ABNORMAL LOW (ref 13.0–17.0)
Hemoglobin: 8.5 g/dL — ABNORMAL LOW (ref 13.0–17.0)
O2 Saturation: 100 %
O2 Saturation: 100 %
O2 Saturation: 100 %
O2 Saturation: 100 %
O2 Saturation: 98 %
O2 Saturation: 98 %
O2 Saturation: 98 %
Patient temperature: 36.7
Patient temperature: 37.1
Patient temperature: 37.1
Potassium: 3.9 mmol/L (ref 3.5–5.1)
Potassium: 4.2 mmol/L (ref 3.5–5.1)
Potassium: 4.1 mmol/L (ref 3.5–5.1)
Potassium: 3.9 mmol/L (ref 3.5–5.1)
Potassium: 4.2 mmol/L (ref 3.5–5.1)
Potassium: 4.5 mmol/L (ref 3.5–5.1)
Potassium: 4.1 mmol/L (ref 3.5–5.1)
Sodium: 138 mmol/L (ref 135–145)
Sodium: 141 mmol/L (ref 135–145)
Sodium: 141 mmol/L (ref 135–145)
Sodium: 142 mmol/L (ref 135–145)
Sodium: 143 mmol/L (ref 135–145)
Sodium: 142 mmol/L (ref 135–145)
Sodium: 142 mmol/L (ref 135–145)
TCO2: 26 mmol/L (ref 22–32)
TCO2: 27 mmol/L (ref 22–32)
TCO2: 27 mmol/L (ref 22–32)
TCO2: 27 mmol/L (ref 22–32)
TCO2: 27 mmol/L (ref 22–32)
TCO2: 26 mmol/L (ref 22–32)
TCO2: 24 mmol/L (ref 22–32)
pCO2 arterial: 34.1 mmHg (ref 32.0–48.0)
pCO2 arterial: 38 mmHg (ref 32.0–48.0)
pCO2 arterial: 37 mmHg (ref 32.0–48.0)
pCO2 arterial: 45.4 mmHg (ref 32.0–48.0)
pCO2 arterial: 49.5 mmHg — ABNORMAL HIGH (ref 32.0–48.0)
pCO2 arterial: 37.6 mmHg (ref 32.0–48.0)
pCO2 arterial: 39.5 mmHg (ref 32.0–48.0)
pH, Arterial: 7.429 (ref 7.350–7.450)
pH, Arterial: 7.486 — ABNORMAL HIGH (ref 7.350–7.450)
pH, Arterial: 7.45 (ref 7.350–7.450)
pH, Arterial: 7.363 (ref 7.350–7.450)
pH, Arterial: 7.319 — ABNORMAL LOW (ref 7.350–7.450)
pH, Arterial: 7.421 (ref 7.350–7.450)
pH, Arterial: 7.374 (ref 7.350–7.450)
pO2, Arterial: 315 mmHg — ABNORMAL HIGH (ref 83.0–108.0)
pO2, Arterial: 385 mmHg — ABNORMAL HIGH (ref 83.0–108.0)
pO2, Arterial: 343 mmHg — ABNORMAL HIGH (ref 83.0–108.0)
pO2, Arterial: 245 mmHg — ABNORMAL HIGH (ref 83.0–108.0)
pO2, Arterial: 115 mmHg — ABNORMAL HIGH (ref 83.0–108.0)
pO2, Arterial: 109 mmHg — ABNORMAL HIGH (ref 83.0–108.0)
pO2, Arterial: 104 mmHg (ref 83.0–108.0)

## 2020-03-08 LAB — BLOOD GAS, ARTERIAL
Acid-Base Excess: 1 mmol/L (ref 0.0–2.0)
Bicarbonate: 24.7 mmol/L (ref 20.0–28.0)
FIO2: 21
O2 Saturation: 92.8 %
Patient temperature: 37
pCO2 arterial: 37 mmHg (ref 32.0–48.0)
pH, Arterial: 7.44 (ref 7.350–7.450)
pO2, Arterial: 65.7 mmHg — ABNORMAL LOW (ref 83.0–108.0)

## 2020-03-08 LAB — CBC
HCT: 36.5 % — ABNORMAL LOW (ref 39.0–52.0)
HCT: 29.6 % — ABNORMAL LOW (ref 39.0–52.0)
HCT: 25.3 % — ABNORMAL LOW (ref 39.0–52.0)
Hemoglobin: 11.9 g/dL — ABNORMAL LOW (ref 13.0–17.0)
Hemoglobin: 9.2 g/dL — ABNORMAL LOW (ref 13.0–17.0)
Hemoglobin: 8.2 g/dL — ABNORMAL LOW (ref 13.0–17.0)
MCH: 30.1 pg (ref 26.0–34.0)
MCH: 29.9 pg (ref 26.0–34.0)
MCH: 30.5 pg (ref 26.0–34.0)
MCHC: 32.6 g/dL (ref 30.0–36.0)
MCHC: 31.1 g/dL (ref 30.0–36.0)
MCHC: 32.4 g/dL (ref 30.0–36.0)
MCV: 92.2 fL (ref 80.0–100.0)
MCV: 96.1 fL (ref 80.0–100.0)
MCV: 94.1 fL (ref 80.0–100.0)
Platelets: 306 10*3/uL (ref 150–400)
Platelets: 182 10*3/uL (ref 150–400)
Platelets: 154 10*3/uL (ref 150–400)
RBC: 3.96 MIL/uL — ABNORMAL LOW (ref 4.22–5.81)
RBC: 3.08 MIL/uL — ABNORMAL LOW (ref 4.22–5.81)
RBC: 2.69 MIL/uL — ABNORMAL LOW (ref 4.22–5.81)
RDW: 16.9 % — ABNORMAL HIGH (ref 11.5–15.5)
RDW: 16.9 % — ABNORMAL HIGH (ref 11.5–15.5)
RDW: 17.1 % — ABNORMAL HIGH (ref 11.5–15.5)
WBC: 7.2 10*3/uL (ref 4.0–10.5)
WBC: 18.8 10*3/uL — ABNORMAL HIGH (ref 4.0–10.5)
WBC: 12.1 10*3/uL — ABNORMAL HIGH (ref 4.0–10.5)
nRBC: 0 % (ref 0.0–0.2)
nRBC: 0 % (ref 0.0–0.2)
nRBC: 0 % (ref 0.0–0.2)

## 2020-03-08 LAB — URINALYSIS, ROUTINE W REFLEX MICROSCOPIC
Bacteria, UA: NONE SEEN
Bilirubin Urine: NEGATIVE
Glucose, UA: NEGATIVE mg/dL
Hgb urine dipstick: NEGATIVE
Ketones, ur: NEGATIVE mg/dL
Leukocytes,Ua: NEGATIVE
Nitrite: NEGATIVE
Protein, ur: NEGATIVE mg/dL
Specific Gravity, Urine: 1.017 (ref 1.005–1.030)
pH: 6 (ref 5.0–8.0)

## 2020-03-08 LAB — BASIC METABOLIC PANEL
Anion gap: 12 (ref 5–15)
Anion gap: 8 (ref 5–15)
BUN: 12 mg/dL (ref 6–20)
BUN: 8 mg/dL (ref 6–20)
CO2: 22 mmol/L (ref 22–32)
CO2: 21 mmol/L — ABNORMAL LOW (ref 22–32)
Calcium: 9 mg/dL (ref 8.9–10.3)
Calcium: 7.5 mg/dL — ABNORMAL LOW (ref 8.9–10.3)
Chloride: 101 mmol/L (ref 98–111)
Chloride: 109 mmol/L (ref 98–111)
Creatinine, Ser: 0.96 mg/dL (ref 0.61–1.24)
Creatinine, Ser: 0.71 mg/dL (ref 0.61–1.24)
GFR, Estimated: >60 mL/min (ref >60)
GFR, Estimated: >60 mL/min (ref >60)
Glucose, Bld: 178 mg/dL — ABNORMAL HIGH (ref 70–99)
Glucose, Bld: 138 mg/dL — ABNORMAL HIGH (ref 70–99)
Potassium: 3.7 mmol/L (ref 3.5–5.1)
Potassium: 4 mmol/L (ref 3.5–5.1)
Sodium: 135 mmol/L (ref 135–145)
Sodium: 138 mmol/L (ref 135–145)

## 2020-03-08 LAB — POCT I-STAT EG7
Acid-Base Excess: 0 mmol/L (ref 0.0–2.0)
Bicarbonate: 24.9 mmol/L (ref 20.0–28.0)
Calcium, Ion: 1.04 mmol/L — ABNORMAL LOW (ref 1.15–1.40)
HCT: 26 % — ABNORMAL LOW (ref 39.0–52.0)
Hemoglobin: 8.8 g/dL — ABNORMAL LOW (ref 13.0–17.0)
O2 Saturation: 70 %
Potassium: 3.4 mmol/L — ABNORMAL LOW (ref 3.5–5.1)
Sodium: 142 mmol/L (ref 135–145)
TCO2: 26 mmol/L (ref 22–32)
pCO2, Ven: 39.8 mmHg — ABNORMAL LOW (ref 44.0–60.0)
pH, Ven: 7.404 (ref 7.250–7.430)
pO2, Ven: 36 mmHg (ref 32.0–45.0)

## 2020-03-08 LAB — GLUCOSE, CAPILLARY
Glucose-Capillary: 172 mg/dL — ABNORMAL HIGH (ref 70–99)
Glucose-Capillary: 108 mg/dL — ABNORMAL HIGH (ref 70–99)
Glucose-Capillary: 147 mg/dL — ABNORMAL HIGH (ref 70–99)
Glucose-Capillary: 145 mg/dL — ABNORMAL HIGH (ref 70–99)
Glucose-Capillary: 135 mg/dL — ABNORMAL HIGH (ref 70–99)
Glucose-Capillary: 144 mg/dL — ABNORMAL HIGH (ref 70–99)
Glucose-Capillary: 130 mg/dL — ABNORMAL HIGH (ref 70–99)
Glucose-Capillary: 131 mg/dL — ABNORMAL HIGH (ref 70–99)
Glucose-Capillary: 167 mg/dL — ABNORMAL HIGH (ref 70–99)
Glucose-Capillary: 147 mg/dL — ABNORMAL HIGH (ref 70–99)

## 2020-03-08 LAB — PROTIME-INR
INR: 1.4 — ABNORMAL HIGH (ref 0.8–1.2)
Prothrombin Time: 17 seconds — ABNORMAL HIGH (ref 11.4–15.2)

## 2020-03-08 LAB — PLATELET COUNT: Platelets: 187 10*3/uL (ref 150–400)

## 2020-03-08 LAB — ECHO INTRAOPERATIVE TEE
AV Mean grad: 4 mmHg
Height: 68 in
Weight: 2856 oz

## 2020-03-08 LAB — APTT: aPTT: 39 seconds — ABNORMAL HIGH (ref 24–36)

## 2020-03-08 LAB — HEPATIC FUNCTION PANEL
ALT: 70 U/L — ABNORMAL HIGH (ref 0–44)
AST: 49 U/L — ABNORMAL HIGH (ref 15–41)
Albumin: 3 g/dL — ABNORMAL LOW (ref 3.5–5.0)
Alkaline Phosphatase: 100 U/L (ref 38–126)
Bilirubin, Direct: 0.2 mg/dL (ref 0.0–0.2)
Indirect Bilirubin: 0.9 mg/dL (ref 0.3–0.9)
Total Bilirubin: 1.1 mg/dL (ref 0.3–1.2)
Total Protein: 7.3 g/dL (ref 6.5–8.1)

## 2020-03-08 LAB — MAGNESIUM
Magnesium: 2.2 mg/dL (ref 1.7–2.4)
Magnesium: 2.8 mg/dL — ABNORMAL HIGH (ref 1.7–2.4)

## 2020-03-08 LAB — HEMOGLOBIN AND HEMATOCRIT, BLOOD
HCT: 24.2 % — ABNORMAL LOW (ref 39.0–52.0)
Hemoglobin: 7.6 g/dL — ABNORMAL LOW (ref 13.0–17.0)

## 2020-03-08 LAB — PREPARE RBC (CROSSMATCH)

## 2020-03-08 SURGERY — CORONARY ARTERY BYPASS GRAFTING (CABG)
Anesthesia: General | Site: Chest | Laterality: Right

## 2020-03-08 MED ORDER — ASPIRIN 81 MG PO CHEW
324.0000 mg | CHEWABLE_TABLET | Freq: Every day | ORAL | Status: DC
  Filled 2020-03-08: qty 4

## 2020-03-08 MED ORDER — SODIUM CHLORIDE 0.9% FLUSH
10.0000 mL | Freq: Two times a day (BID) | INTRAVENOUS | Status: DC
  Administered 2020-03-08: 20 mL
  Administered 2020-03-09: 10 mL

## 2020-03-08 MED ORDER — FENTANYL CITRATE (PF) 250 MCG/5ML IJ SOLN
INTRAMUSCULAR | Status: AC
  Filled 2020-03-08: qty 25

## 2020-03-08 MED ORDER — CHLORHEXIDINE GLUCONATE 0.12 % MT SOLN
15.0000 mL | Freq: Two times a day (BID) | OROMUCOSAL | Status: DC
  Administered 2020-03-09: 15 mL via OROMUCOSAL

## 2020-03-08 MED ORDER — VANCOMYCIN HCL 1000 MG IV SOLR
INTRAVENOUS | Status: DC | PRN
  Administered 2020-03-08: 1000 mg

## 2020-03-08 MED ORDER — POTASSIUM CHLORIDE 10 MEQ/50ML IV SOLN
10.0000 meq | INTRAVENOUS | Status: AC
Start: 2020-03-08 — End: 2020-03-08

## 2020-03-08 MED ORDER — LACTATED RINGERS IV SOLN: 500.0000 mL | Freq: Once | INTRAVENOUS | Status: DC | PRN

## 2020-03-08 MED ORDER — HEPARIN SODIUM (PORCINE) 1000 UNIT/ML IJ SOLN
INTRAMUSCULAR | Status: AC
  Filled 2020-03-08: qty 1

## 2020-03-08 MED ORDER — ARTIFICIAL TEARS OPHTHALMIC OINT
TOPICAL_OINTMENT | OPHTHALMIC | Status: DC | PRN
  Administered 2020-03-08: 1 via OPHTHALMIC

## 2020-03-08 MED ORDER — BUPIVACAINE LIPOSOME 1.3 % IJ SUSP
20.0000 mL | Freq: Once | INTRAMUSCULAR | Status: DC
  Filled 2020-03-08: qty 20

## 2020-03-08 MED ORDER — ALBUMIN HUMAN 5 % IV SOLN: INTRAVENOUS | Status: DC | PRN

## 2020-03-08 MED ORDER — ARTIFICIAL TEARS OPHTHALMIC OINT
TOPICAL_OINTMENT | OPHTHALMIC | Status: AC
  Filled 2020-03-08: qty 3.5

## 2020-03-08 MED ORDER — NITROGLYCERIN IN D5W 200-5 MCG/ML-% IV SOLN
INTRAVENOUS | Status: DC | PRN
  Administered 2020-03-08: 30 ug/min via INTRAVENOUS

## 2020-03-08 MED ORDER — LACTATED RINGERS IV SOLN: INTRAVENOUS | Status: DC | PRN

## 2020-03-08 MED ORDER — PLASMA-LYTE 148 IV SOLN
INTRAVENOUS | Status: DC | PRN
  Administered 2020-03-08: 500 mL via INTRAVASCULAR

## 2020-03-08 MED ORDER — METOPROLOL TARTRATE 5 MG/5ML IV SOLN
2.5000 mg | INTRAVENOUS | Status: DC | PRN
  Administered 2020-03-08: 5 mg via INTRAVENOUS
  Filled 2020-03-08: qty 5

## 2020-03-08 MED ORDER — SODIUM CHLORIDE 0.9% FLUSH
3.0000 mL | INTRAVENOUS | Status: DC | PRN
  Administered 2020-03-09: 3 mL via INTRAVENOUS

## 2020-03-08 MED ORDER — ROCURONIUM BROMIDE 10 MG/ML (PF) SYRINGE
PREFILLED_SYRINGE | INTRAVENOUS | Status: AC
  Filled 2020-03-08: qty 40

## 2020-03-08 MED ORDER — FAMOTIDINE IN NACL 20-0.9 MG/50ML-% IV SOLN
20.0000 mg | Freq: Two times a day (BID) | INTRAVENOUS | Status: DC
  Administered 2020-03-08: 20 mg via INTRAVENOUS
  Filled 2020-03-08: qty 50

## 2020-03-08 MED ORDER — MORPHINE SULFATE (PF) 2 MG/ML IV SOLN: 1.0000 mg | INTRAVENOUS | Status: DC | PRN

## 2020-03-08 MED ORDER — DEXMEDETOMIDINE HCL IN NACL 400 MCG/100ML IV SOLN
0.0000 ug/kg/h | INTRAVENOUS | Status: DC
  Administered 2020-03-08: 0.7 ug/kg/h via INTRAVENOUS
  Filled 2020-03-08: qty 100

## 2020-03-08 MED ORDER — SODIUM CHLORIDE 0.9% FLUSH: 10.0000 mL | INTRAVENOUS | Status: DC | PRN

## 2020-03-08 MED ORDER — PHENYLEPHRINE 40 MCG/ML (10ML) SYRINGE FOR IV PUSH (FOR BLOOD PRESSURE SUPPORT)
PREFILLED_SYRINGE | INTRAVENOUS | Status: DC | PRN
  Administered 2020-03-08: 80 ug via INTRAVENOUS

## 2020-03-08 MED ORDER — SODIUM CHLORIDE 0.45 % IV SOLN: INTRAVENOUS | Status: DC | PRN

## 2020-03-08 MED ORDER — DEXTROSE 50 % IV SOLN: 0.0000 mL | INTRAVENOUS | Status: DC | PRN

## 2020-03-08 MED ORDER — BUPIVACAINE HCL (PF) 0.5 % IJ SOLN
INTRAMUSCULAR | Status: AC
  Filled 2020-03-08: qty 30

## 2020-03-08 MED ORDER — PLATELET RICH PLASMA OPTIME
Status: DC | PRN
  Administered 2020-03-08: 10 mL

## 2020-03-08 MED ORDER — ALBUMIN HUMAN 5 % IV SOLN
250.0000 mL | INTRAVENOUS | Status: AC | PRN
  Administered 2020-03-08 – 2020-03-09 (×3): 12.5 g via INTRAVENOUS
  Filled 2020-03-08: qty 250

## 2020-03-08 MED ORDER — FENTANYL CITRATE (PF) 100 MCG/2ML IJ SOLN
INTRAMUSCULAR | Status: DC | PRN
  Administered 2020-03-08: 150 ug via INTRAVENOUS
  Administered 2020-03-08: 50 ug via INTRAVENOUS
  Administered 2020-03-08 (×4): 150 ug via INTRAVENOUS
  Administered 2020-03-08: 50 ug via INTRAVENOUS
  Administered 2020-03-08: 150 ug via INTRAVENOUS
  Administered 2020-03-08: 100 ug via INTRAVENOUS

## 2020-03-08 MED ORDER — BISACODYL 10 MG RE SUPP: 10.0000 mg | Freq: Every day | RECTAL | Status: DC

## 2020-03-08 MED ORDER — LACTATED RINGERS IV SOLN: INTRAVENOUS | Status: DC

## 2020-03-08 MED ORDER — SODIUM CHLORIDE 0.9 % IV SOLN
1.5000 g | Freq: Two times a day (BID) | INTRAVENOUS | Status: AC
  Administered 2020-03-08 – 2020-03-10 (×4): 1.5 g via INTRAVENOUS
  Filled 2020-03-08 (×3): qty 1.5

## 2020-03-08 MED ORDER — NICARDIPINE HCL IN NACL 20-0.86 MG/200ML-% IV SOLN
3.0000 mg/h | INTRAVENOUS | Status: DC
  Filled 2020-03-08: qty 200

## 2020-03-08 MED ORDER — OXYCODONE HCL 5 MG PO TABS
5.0000 mg | ORAL_TABLET | ORAL | Status: DC | PRN
  Administered 2020-03-09 (×2): 5 mg via ORAL
  Administered 2020-03-10: 10 mg via ORAL
  Filled 2020-03-08: qty 2
  Filled 2020-03-08 (×2): qty 1

## 2020-03-08 MED ORDER — CHLORHEXIDINE GLUCONATE 0.12 % MT SOLN
15.0000 mL | OROMUCOSAL | Status: AC
  Administered 2020-03-08: 15 mL via OROMUCOSAL

## 2020-03-08 MED ORDER — ACETAMINOPHEN 650 MG RE SUPP
650.0000 mg | Freq: Once | RECTAL | Status: AC
  Administered 2020-03-08: 650 mg via RECTAL

## 2020-03-08 MED ORDER — BISACODYL 5 MG PO TBEC
10.0000 mg | DELAYED_RELEASE_TABLET | Freq: Every day | ORAL | Status: DC
  Administered 2020-03-09 – 2020-03-10 (×2): 10 mg via ORAL
  Filled 2020-03-08 (×4): qty 2

## 2020-03-08 MED ORDER — PHENYLEPHRINE HCL-NACL 20-0.9 MG/250ML-% IV SOLN: 0.0000 ug/min | INTRAVENOUS | Status: DC

## 2020-03-08 MED ORDER — LACTATED RINGERS IV SOLN
INTRAVENOUS | Status: DC
  Administered 2020-03-09: 1000 mL via INTRAVENOUS

## 2020-03-08 MED ORDER — MAGNESIUM SULFATE 4 GM/100ML IV SOLN
4.0000 g | Freq: Once | INTRAVENOUS | Status: AC
  Administered 2020-03-08: 4 g via INTRAVENOUS
  Filled 2020-03-08: qty 100

## 2020-03-08 MED ORDER — DEXMEDETOMIDINE HCL IN NACL 400 MCG/100ML IV SOLN
INTRAVENOUS | Status: DC | PRN
  Administered 2020-03-08: .3 ug/kg/h via INTRAVENOUS

## 2020-03-08 MED ORDER — PROTAMINE SULFATE 10 MG/ML IV SOLN
INTRAVENOUS | Status: AC
  Filled 2020-03-08: qty 25

## 2020-03-08 MED ORDER — NITROGLYCERIN 0.2 MG/ML ON CALL CATH LAB
INTRAVENOUS | Status: DC | PRN
  Administered 2020-03-08: 20 ug via INTRAVENOUS

## 2020-03-08 MED ORDER — DOCUSATE SODIUM 100 MG PO CAPS
200.0000 mg | ORAL_CAPSULE | Freq: Every day | ORAL | Status: DC
  Administered 2020-03-09 – 2020-03-11 (×3): 200 mg via ORAL
  Filled 2020-03-08 (×5): qty 2

## 2020-03-08 MED ORDER — PHENYLEPHRINE HCL-NACL 20-0.9 MG/250ML-% IV SOLN
INTRAVENOUS | Status: DC | PRN
  Administered 2020-03-08: 20 ug/min via INTRAVENOUS

## 2020-03-08 MED ORDER — THROMBIN 5000 UNITS EX SOLR
INTRAVENOUS | Status: DC | PRN
  Administered 2020-03-08: 2 mL

## 2020-03-08 MED ORDER — CHLORHEXIDINE GLUCONATE CLOTH 2 % EX PADS
6.0000 | MEDICATED_PAD | Freq: Every day | CUTANEOUS | Status: DC
  Administered 2020-03-08 – 2020-03-12 (×5): 6 via TOPICAL

## 2020-03-08 MED ORDER — ROCURONIUM BROMIDE 10 MG/ML (PF) SYRINGE
PREFILLED_SYRINGE | INTRAVENOUS | Status: DC | PRN
  Administered 2020-03-08: 100 mg via INTRAVENOUS
  Administered 2020-03-08 (×4): 50 mg via INTRAVENOUS

## 2020-03-08 MED ORDER — PHENYLEPHRINE 40 MCG/ML (10ML) SYRINGE FOR IV PUSH (FOR BLOOD PRESSURE SUPPORT)
PREFILLED_SYRINGE | INTRAVENOUS | Status: AC
  Filled 2020-03-08: qty 10

## 2020-03-08 MED ORDER — 0.9 % SODIUM CHLORIDE (POUR BTL) OPTIME
TOPICAL | Status: DC | PRN
  Administered 2020-03-08: 5000 mL

## 2020-03-08 MED ORDER — PLATELET POOR PLASMA OPTIME
Status: DC | PRN
  Administered 2020-03-08: 10 mL

## 2020-03-08 MED ORDER — TRAMADOL HCL 50 MG PO TABS
50.0000 mg | ORAL_TABLET | ORAL | Status: DC | PRN
  Administered 2020-03-09 – 2020-03-10 (×2): 100 mg via ORAL
  Administered 2020-03-11 (×3): 50 mg via ORAL
  Administered 2020-03-11 – 2020-03-12 (×4): 100 mg via ORAL
  Administered 2020-03-12: 50 mg via ORAL
  Administered 2020-03-12 – 2020-03-13 (×4): 100 mg via ORAL
  Filled 2020-03-08 (×2): qty 2
  Filled 2020-03-08: qty 1
  Filled 2020-03-08 (×2): qty 2
  Filled 2020-03-08: qty 1
  Filled 2020-03-08 (×2): qty 2
  Filled 2020-03-08: qty 1
  Filled 2020-03-08 (×3): qty 2
  Filled 2020-03-08: qty 1
  Filled 2020-03-08: qty 2

## 2020-03-08 MED ORDER — INSULIN REGULAR(HUMAN) IN NACL 100-0.9 UT/100ML-% IV SOLN: INTRAVENOUS | Status: AC

## 2020-03-08 MED ORDER — MIDAZOLAM HCL 2 MG/2ML IJ SOLN: 2.0000 mg | INTRAMUSCULAR | Status: DC | PRN

## 2020-03-08 MED ORDER — ACETAMINOPHEN 160 MG/5ML PO SOLN: 650.0000 mg | Freq: Once | ORAL | Status: AC

## 2020-03-08 MED ORDER — METOPROLOL TARTRATE 12.5 MG HALF TABLET
12.5000 mg | ORAL_TABLET | Freq: Two times a day (BID) | ORAL | Status: DC
  Administered 2020-03-09 – 2020-03-13 (×8): 12.5 mg via ORAL
  Filled 2020-03-08 (×8): qty 1

## 2020-03-08 MED ORDER — ONDANSETRON HCL 4 MG/2ML IJ SOLN: 4.0000 mg | Freq: Four times a day (QID) | INTRAMUSCULAR | Status: DC | PRN

## 2020-03-08 MED ORDER — MIDAZOLAM HCL (PF) 10 MG/2ML IJ SOLN
INTRAMUSCULAR | Status: AC
  Filled 2020-03-08: qty 2

## 2020-03-08 MED ORDER — PANTOPRAZOLE SODIUM 40 MG PO TBEC
40.0000 mg | DELAYED_RELEASE_TABLET | Freq: Every day | ORAL | Status: DC
  Administered 2020-03-10 – 2020-03-13 (×4): 40 mg via ORAL
  Filled 2020-03-08 (×4): qty 1

## 2020-03-08 MED ORDER — SODIUM CHLORIDE 0.9% FLUSH
3.0000 mL | Freq: Two times a day (BID) | INTRAVENOUS | Status: DC
  Administered 2020-03-09 (×2): 3 mL via INTRAVENOUS

## 2020-03-08 MED ORDER — NITROGLYCERIN IN D5W 200-5 MCG/ML-% IV SOLN: 0.0000 ug/min | INTRAVENOUS | Status: DC

## 2020-03-08 MED ORDER — STERILE WATER FOR INJECTION IJ SOLN
INTRAMUSCULAR | Status: AC
  Filled 2020-03-08: qty 10

## 2020-03-08 MED ORDER — ACETAMINOPHEN 160 MG/5ML PO SOLN: 1000.0000 mg | Freq: Four times a day (QID) | ORAL | Status: DC

## 2020-03-08 MED ORDER — ORAL CARE MOUTH RINSE
15.0000 mL | Freq: Two times a day (BID) | OROMUCOSAL | Status: DC
  Administered 2020-03-08 – 2020-03-09 (×2): 15 mL via OROMUCOSAL

## 2020-03-08 MED ORDER — SODIUM CHLORIDE 0.9 % IV SOLN
INTRAVENOUS | Status: DC | PRN
  Administered 2020-03-08: 1.5 g via INTRAVENOUS

## 2020-03-08 MED ORDER — MIDAZOLAM HCL (PF) 5 MG/ML IJ SOLN
INTRAMUSCULAR | Status: DC | PRN
  Administered 2020-03-08: 1 mg via INTRAVENOUS
  Administered 2020-03-08 (×2): 2 mg via INTRAVENOUS
  Administered 2020-03-08: 1 mg via INTRAVENOUS

## 2020-03-08 MED ORDER — ACETAMINOPHEN 500 MG PO TABS
1000.0000 mg | ORAL_TABLET | Freq: Four times a day (QID) | ORAL | Status: DC
  Administered 2020-03-08 – 2020-03-13 (×17): 1000 mg via ORAL
  Filled 2020-03-08 (×17): qty 2

## 2020-03-08 MED ORDER — VANCOMYCIN HCL IN DEXTROSE 1-5 GM/200ML-% IV SOLN
1000.0000 mg | Freq: Once | INTRAVENOUS | Status: AC
  Administered 2020-03-08: 1000 mg via INTRAVENOUS
  Filled 2020-03-08: qty 200

## 2020-03-08 MED ORDER — PROTAMINE SULFATE 10 MG/ML IV SOLN
INTRAVENOUS | Status: DC | PRN
  Administered 2020-03-08: 30 mg via INTRAVENOUS
  Administered 2020-03-08: 20 mg via INTRAVENOUS

## 2020-03-08 MED ORDER — HEPARIN SODIUM (PORCINE) 1000 UNIT/ML IJ SOLN
INTRAMUSCULAR | Status: DC | PRN
  Administered 2020-03-08: 5000 [IU] via INTRAVENOUS
  Administered 2020-03-08: 26000 [IU] via INTRAVENOUS

## 2020-03-08 MED ORDER — ATORVASTATIN CALCIUM 80 MG PO TABS
80.0000 mg | ORAL_TABLET | Freq: Every day | ORAL | Status: DC
  Administered 2020-03-09 – 2020-03-13 (×5): 80 mg via ORAL
  Filled 2020-03-08 (×5): qty 1

## 2020-03-08 MED ORDER — PROPOFOL 10 MG/ML IV BOLUS
INTRAVENOUS | Status: DC | PRN
  Administered 2020-03-08: 100 mg via INTRAVENOUS

## 2020-03-08 MED ORDER — PROPOFOL 10 MG/ML IV BOLUS
INTRAVENOUS | Status: AC
  Filled 2020-03-08: qty 20

## 2020-03-08 MED ORDER — VANCOMYCIN HCL 1000 MG IV SOLR
INTRAVENOUS | Status: AC
  Filled 2020-03-08: qty 3000

## 2020-03-08 MED ORDER — ASPIRIN EC 325 MG PO TBEC
325.0000 mg | DELAYED_RELEASE_TABLET | Freq: Every day | ORAL | Status: DC
  Administered 2020-03-09 – 2020-03-13 (×5): 325 mg via ORAL
  Filled 2020-03-08 (×5): qty 1

## 2020-03-08 MED ORDER — SODIUM CHLORIDE 0.9 % IV SOLN: 250.0000 mL | INTRAVENOUS | Status: DC

## 2020-03-08 MED ORDER — METOPROLOL TARTRATE 25 MG/10 ML ORAL SUSPENSION
12.5000 mg | Freq: Two times a day (BID) | ORAL | Status: DC
  Filled 2020-03-08 (×7): qty 5

## 2020-03-08 MED ORDER — CHLORHEXIDINE GLUCONATE CLOTH 2 % EX PADS
6.0000 | MEDICATED_PAD | Freq: Every day | CUTANEOUS | Status: DC
  Administered 2020-03-08 – 2020-03-09 (×2): 6 via TOPICAL

## 2020-03-08 MED ORDER — BUPIVACAINE LIPOSOME 1.3 % IJ SUSP
INTRAMUSCULAR | Status: DC | PRN
  Administered 2020-03-08: 50 mL

## 2020-03-08 MED ORDER — SODIUM CHLORIDE 0.9 % IV SOLN: INTRAVENOUS | Status: DC

## 2020-03-08 SURGICAL SUPPLY — 104 items
ADAPTER CARDIO PERF ANTE/RETRO (ADAPTER) ×5 IMPLANT
APPLICATOR TIP COSEAL (VASCULAR PRODUCTS) IMPLANT
BAG DECANTER FOR FLEXI CONT (MISCELLANEOUS) ×5 IMPLANT
BENZOIN TINCTURE PRP APPL 2/3 (GAUZE/BANDAGES/DRESSINGS) ×10 IMPLANT
BLADE 15 SAFETY STRL DISP (BLADE) ×5 IMPLANT
BLADE CLIPPER SURG (BLADE) ×5 IMPLANT
BLADE STERNUM SYSTEM 6 (BLADE) ×5 IMPLANT
BNDG ELASTIC 4X5.8 VLCR NS LF (GAUZE/BANDAGES/DRESSINGS) ×5 IMPLANT
BNDG ELASTIC 4X5.8 VLCR STR LF (GAUZE/BANDAGES/DRESSINGS) ×10 IMPLANT
BNDG ELASTIC 6X5.8 VLCR STR LF (GAUZE/BANDAGES/DRESSINGS) ×5 IMPLANT
BNDG GAUZE ELAST 4 BULKY (GAUZE/BANDAGES/DRESSINGS) ×10 IMPLANT
CANISTER SUCT 3000ML PPV (MISCELLANEOUS) ×5 IMPLANT
CANISTER WOUNDNEG PRESSURE 500 (CANNISTER) ×5 IMPLANT
CANNULA NON VENT 20FR 12 (CANNULA) ×5 IMPLANT
CATH CPB KIT HENDRICKSON (MISCELLANEOUS) ×5 IMPLANT
CATH RETROPLEGIA CORONARY 14FR (CATHETERS) ×5 IMPLANT
CATH ROBINSON RED A/P 18FR (CATHETERS) ×15 IMPLANT
CLIP RETRACTION 3.0MM CORONARY (MISCELLANEOUS) ×5 IMPLANT
DERMABOND ADVANCED (GAUZE/BANDAGES/DRESSINGS) ×1
DERMABOND ADVANCED .7 DNX12 (GAUZE/BANDAGES/DRESSINGS) ×4 IMPLANT
DRAIN CHANNEL 28F RND 3/8 FF (WOUND CARE) ×15 IMPLANT
DRAPE CARDIOVASCULAR INCISE (DRAPES) ×1
DRAPE SLUSH/WARMER DISC (DRAPES) ×5 IMPLANT
DRAPE SRG 135X102X78XABS (DRAPES) ×4 IMPLANT
DRESSING PRVNA BELLAFORM 21X19 (GAUZE/BANDAGES/DRESSINGS) ×4 IMPLANT
DRSG AQUACEL AG ADV 3.5X14 (GAUZE/BANDAGES/DRESSINGS) IMPLANT
DRSG PREVENA BELLAFORM 21X19 (GAUZE/BANDAGES/DRESSINGS) ×5
ELECT CAUTERY BLADE 6.4 (BLADE) ×5 IMPLANT
ELECT REM PT RETURN 9FT ADLT (ELECTROSURGICAL) ×10
ELECTRODE REM PT RTRN 9FT ADLT (ELECTROSURGICAL) ×8 IMPLANT
FELT TEFLON 1X6 (MISCELLANEOUS) ×5 IMPLANT
GAUZE SPONGE 4X4 12PLY STRL (GAUZE/BANDAGES/DRESSINGS) ×15 IMPLANT
GAUZE SPONGE 4X4 12PLY STRL LF (GAUZE/BANDAGES/DRESSINGS) ×15 IMPLANT
GLOVE NEODERM STRL 7.5 LF PF (GLOVE) ×12 IMPLANT
GLOVE SURG NEODERM 7.5  LF PF (GLOVE) ×3
GLOVE SURG SS PI 6.0 STRL IVOR (GLOVE) ×15 IMPLANT
GLOVE SURG SS PI 6.5 STRL IVOR (GLOVE) ×15 IMPLANT
GOWN STRL REUS W/ TWL LRG LVL3 (GOWN DISPOSABLE) ×28 IMPLANT
GOWN STRL REUS W/TWL LRG LVL3 (GOWN DISPOSABLE) ×7
HEMOSTAT POWDER SURGIFOAM 1G (HEMOSTASIS) IMPLANT
INSERT SUTURE HOLDER (MISCELLANEOUS) ×5 IMPLANT
KIT APPLICATOR RATIO 11:1 (KITS) ×5 IMPLANT
KIT BASIN OR (CUSTOM PROCEDURE TRAY) ×5 IMPLANT
KIT PREVENA INCISION MGT20CM45 (CANNISTER) ×5 IMPLANT
KIT SUCTION CATH 14FR (SUCTIONS) ×5 IMPLANT
KIT TURNOVER KIT B (KITS) ×5 IMPLANT
KIT VASOVIEW HEMOPRO 2 VH 4000 (KITS) ×5 IMPLANT
MARKER GRAFT CORONARY BYPASS (MISCELLANEOUS) IMPLANT
NEEDLE 18GX1X1/2 (RX/OR ONLY) (NEEDLE) ×5 IMPLANT
NS IRRIG 1000ML POUR BTL (IV SOLUTION) ×25 IMPLANT
PACK E OPEN HEART (SUTURE) ×5 IMPLANT
PACK OPEN HEART (CUSTOM PROCEDURE TRAY) ×5 IMPLANT
PACK PLATELET PROCEDURE 60 (MISCELLANEOUS) ×5 IMPLANT
PACK SPY-PHI (KITS) ×5 IMPLANT
PAD ARMBOARD 7.5X6 YLW CONV (MISCELLANEOUS) ×10 IMPLANT
PAD ELECT DEFIB RADIOL ZOLL (MISCELLANEOUS) ×5 IMPLANT
PENCIL BUTTON HOLSTER BLD 10FT (ELECTRODE) ×5 IMPLANT
POSITIONER HEAD DONUT 9IN (MISCELLANEOUS) ×5 IMPLANT
POWDER SURGICEL 3.0 GRAM (HEMOSTASIS) ×5 IMPLANT
PUNCH AORTIC ROTATE 4.5MM 8IN (MISCELLANEOUS) ×5 IMPLANT
SEALANT SURG COSEAL 4ML (VASCULAR PRODUCTS) IMPLANT
SEALANT SURG COSEAL 8ML (VASCULAR PRODUCTS) ×5 IMPLANT
SET CARDIOPLEGIA MPS 5001102 (MISCELLANEOUS) ×5 IMPLANT
SHEARS HARMONIC 9CM CVD (BLADE) ×5 IMPLANT
STAPLER VISISTAT 35W (STAPLE) ×5 IMPLANT
SUPPORT HEART JANKE-BARRON (MISCELLANEOUS) ×5 IMPLANT
SUT BONE WAX W31G (SUTURE) ×5 IMPLANT
SUT MNCRL AB 3-0 PS2 18 (SUTURE) ×15 IMPLANT
SUT PDS AB 1 CTX 36 (SUTURE) ×10 IMPLANT
SUT PROLENE 3 0 SH DA (SUTURE) ×10 IMPLANT
SUT PROLENE 5 0 C 1 36 (SUTURE) IMPLANT
SUT PROLENE 6 0 C 1 30 (SUTURE) ×20 IMPLANT
SUT PROLENE 7 0 BV 1 (SUTURE) ×15 IMPLANT
SUT PROLENE 7 0 BV1 MDA (SUTURE) ×5 IMPLANT
SUT PROLENE 8 0 BV175 6 (SUTURE) ×5 IMPLANT
SUT PROLENE BLUE 7 0 (SUTURE) ×5 IMPLANT
SUT SILK  1 MH (SUTURE) ×1
SUT SILK 1 MH (SUTURE) ×4 IMPLANT
SUT SILK 2 0 SH CR/8 (SUTURE) ×5 IMPLANT
SUT SILK 3 0 SH CR/8 (SUTURE) IMPLANT
SUT STEEL 6MS V (SUTURE) ×5 IMPLANT
SUT STEEL SZ 6 DBL 3X14 BALL (SUTURE) ×5 IMPLANT
SUT TEM PAC WIRE 2 0 SH (SUTURE) ×5 IMPLANT
SUT VIC AB 2-0 CT1 27 (SUTURE) ×2
SUT VIC AB 2-0 CT1 TAPERPNT 27 (SUTURE) ×8 IMPLANT
SUT VIC AB 2-0 CTX 27 (SUTURE) IMPLANT
SUT VIC AB 3-0 X1 27 (SUTURE) IMPLANT
SYR 10ML LL (SYRINGE) IMPLANT
SYR 30ML LL (SYRINGE) ×5 IMPLANT
SYR 3ML LL SCALE MARK (SYRINGE) ×5 IMPLANT
SYSTEM SAHARA CHEST DRAIN ATS (WOUND CARE) ×5 IMPLANT
TAPE CLOTH SURG 4X10 WHT LF (GAUZE/BANDAGES/DRESSINGS) ×5 IMPLANT
TAPE PAPER 2X10 WHT MICROPORE (GAUZE/BANDAGES/DRESSINGS) ×5 IMPLANT
TIP DUAL SPRAY TOPICAL (TIP) ×10 IMPLANT
TOWEL GREEN STERILE (TOWEL DISPOSABLE) ×5 IMPLANT
TOWEL GREEN STERILE FF (TOWEL DISPOSABLE) ×5 IMPLANT
TRAY FOLEY SLVR 16FR TEMP STAT (SET/KITS/TRAYS/PACK) ×5 IMPLANT
TUBE CONNECTING 20X1/4 (TUBING) ×5 IMPLANT
TUBE SUCTION CARDIAC 10FR (CANNULA) ×5 IMPLANT
TUBING ART PRESS 12 MALE/MALE (MISCELLANEOUS) ×10 IMPLANT
TUBING LAP HI FLOW INSUFFLATIO (TUBING) ×5 IMPLANT
UNDERPAD 30X36 HEAVY ABSORB (UNDERPADS AND DIAPERS) ×10 IMPLANT
WATER STERILE IRR 1000ML POUR (IV SOLUTION) ×10 IMPLANT
WATER STERILE IRR 1000ML UROMA (IV SOLUTION) IMPLANT

## 2020-03-08 NOTE — Anesthesia Procedure Notes (Signed)
Central Venous Catheter Insertion Performed by: Myrtie Soman, MD, anesthesiologist Start/End1/18/2022 7:06 AM, 03/08/2020 7:27 AM Patient location: Pre-op. Preanesthetic checklist: patient identified, IV checked, site marked, risks and benefits discussed, surgical consent, monitors and equipment checked, pre-op evaluation, timeout performed and anesthesia consent Position: Trendelenburg Lidocaine 1% used for infiltration and patient sedated Hand hygiene performed , maximum sterile barriers used  and Seldinger technique used Catheter size: 8 Fr Total catheter length 16. PA cath was placed.Sheath introducer Swan type:thermodilution PA Cath depth:47 Procedure performed without using ultrasound guided technique. Ultrasound Notes:anatomy identified, needle tip was noted to be adjacent to the nerve/plexus identified, no ultrasound evidence of intravascular and/or intraneural injection and image(s) printed for medical record Attempts: 1 Following insertion, dressing applied, line sutured and Biopatch. Post procedure assessment: blood return through all ports, free fluid flow and no air  Patient tolerated the procedure well with no immediate complications.

## 2020-03-08 NOTE — Op Note (Signed)
CARDIOTHORACIC SURGERY OPERATIVE NOTE  Date of Procedure: 03/08/2020  Preoperative Diagnosis: 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  Postoperative Diagnosis: Same  Procedure:    Coronary Artery Bypass Grafting x 5  Left Internal Mammary Artery to Distal Left Anterior Descending Coronary Artery, Saphenous Vein Graft to  Posterior Descending Coronary Artery, reverse saphenous vein graft to right posterolateral artery; left radial artery graft to ramus intermedius vessel and first obtuse Marginal Branch of Left Circumflex Coronary Artery Endoscopic Vein Harvest from right thigh and Lower Leg Open left radial artery harvesting Completion graft surveillance with indocyanine green fluorescence imaging  Surgeon: B.  Murvin Natal, MD  Assistant: Macarthur Critchley, PA-C  Anesthesia: General  Operative Findings:  Preserved left ventricular systolic function  Good quality left internal mammary artery conduit  Good quality saphenous vein conduit; good quality left radial artery  Fair quality target vessels for grafting    BRIEF CLINICAL NOTE AND INDICATIONS FOR SURGERY  60 year old man was admitted with GI bleeding.  He decompensated from this and sustained a cardiac arrest.  His subsequent work-up included a left heart catheterization demonstrating severe multivessel coronary artery disease.  He was referred for CABG.  The patient has been thoroughly evaluated and is considered a good candidate for the procedure.  He is taken the operating room for multivessel coronary artery bypass   DETAILS OF THE OPERATIVE PROCEDURE  Preparation:  The patient is brought to the operating room on the above mentioned date and central monitoring was established by the anesthesia team including placement of Swan-Ganz catheter and radial arterial line. The patient is placed in the supine position on the operating table.  Intravenous antibiotics are administered. General endotracheal  anesthesia is induced uneventfully. A Foley catheter is placed.  Baseline transesophageal echocardiogram was performed.  Findings were notable for no significant valvular dysfunction and preserved left ventricular function  The patient's chest, abdomen, both groins, the left upper extremity, and both lower extremities are prepared and draped in a sterile manner. A time out procedure is performed.   Surgical Approach and Conduit Harvest:  Attention is first turned to the left forearm where the left radial artery is harvested by an open technique.  Once the graft is taken the arm incision is closed in layers and the arm is rechecked at the side.  A median sternotomy incision was performed and the left internal mammary artery is dissected from the chest wall and prepared for bypass grafting. The left internal mammary artery is notably good quality conduit. Simultaneously, the greater saphenous vein is obtained from the patient's right thigh using endoscopic vein harvest technique. The saphenous vein is notably good quality conduit. After removal of the saphenous vein, the small surgical incisions in the lower extremity are closed with absorbable suture. Following systemic heparinization, the left internal mammary artery was transected distally noted to have excellent flow.   Extracorporeal Cardiopulmonary Bypass and Myocardial Protection:  The pericardium is opened. The ascending aorta is normal in appearance. The ascending aorta and the right atrium are cannulated for cardiopulmonary bypass.  Adequate heparinization is verified.   A retrograde cardioplegia cannula is placed through the right atrium into the coronary sinus. *The entire pre-bypass portion of the operation was notable for stable hemodynamics.  Cardiopulmonary bypass was begun and the surface of the heart is inspected. Distal target vessels are selected for coronary artery bypass grafting. A cardioplegia cannula is placed in the ascending  aorta.    The patient is allowed to cool  passively to 34C systemic temperature.  The aortic cross clamp is applied and cold blood cardioplegia is delivered initially in an antegrade fashion through the aortic root.  Supplemental cardioplegia is given retrograde through the coronary sinus catheter.  Iced saline slush is applied for topical hypothermia.  The initial cardioplegic arrest is rapid with early diastolic arrest.  Repeat doses of cardioplegia are administered intermittently throughout the entire cross clamp portion of the operation through the aortic root, through the coronary sinus catheter, and through subsequently placed vein grafts in order to maintain completely flat electrocardiogram.   Coronary Artery Bypass Grafting:   The  posterior descending branch of the right coronary artery was grafted using a reversed saphenous vein graft in an end-to-side fashion.  At the site of distal anastomosis the target vessel was fair quality and measured approximately 1.5 mm in diameter.  The right posterolateral artery was grafted using a reversed saphenous vein graft in an end-to-side fashion.  At the site of the distal anastomosis the target vessel was fair quality measure proxy 1.5 mm in diameter.  The first obtuse marginal branch of the left circumflex coronary artery and the ramus intermedius vessel were grafted using the left radial artery At the site of the distal anastomosis the target vessels were fair quality and measured approximately 1.5 mm in diameter.   The distal left anterior coronary artery was grafted with the left internal mammary artery in an end-to-side fashion.  At the site of distal anastomosis the target vessel was fair quality and measured approximately 1.5 mm in diameter. Anastomotic patency and runoff was confirmed with indocyanine green fluorescence imaging (SPY). All proximal graft anastomoses were placed directly to the ascending aorta prior to removal of the aortic  cross clamp.  A hotshot dose of cardioplegia was given antegrade and retrograde.  De-airing procedures were performed and the aortic cross-clamp was removed.   Procedure Completion:  All proximal and distal coronary anastomoses were inspected for hemostasis and appropriate graft orientation. Epicardial pacing wires are fixed to the right ventricular outflow tract and to the right atrial appendage. The patient is rewarmed to 37C temperature. The patient is weaned and disconnected from cardiopulmonary bypass.  The patient's rhythm at separation from bypass was sinus bradycardia.  The patient was weaned from cardiopulmonary bypass  without any inotropic support.  Followup transesophageal echocardiogram performed after separation from bypass revealed * no changes from the preoperative exam.  The aortic and venous cannula were removed uneventfully. Protamine was administered to reverse the anticoagulation. The mediastinum and pleural space were inspected for hemostasis and irrigated with saline solution. The mediastinum and left pleural space were drained using fluted chest tubes placed through separate stab incisions inferiorly.  The soft tissues anterior to the aorta were reapproximated loosely. The sternum is closed with double strength sternal wire. The soft tissues anterior to the sternum were closed in multiple layers and the skin is closed with a running subcuticular skin closure.  The post-bypass portion of the operation was notable for stable rhythm and hemodynamics.  No blood products were administered during the operation.   Disposition:  The patient tolerated the procedure well and is transported to the surgical intensive care in stable condition. There are no intraoperative complications. All sponge instrument and needle counts are verified correct at completion of the operation.    Jayme Cloud, MD 03/08/2020 3:01 PM

## 2020-03-08 NOTE — Anesthesia Procedure Notes (Signed)
Arterial Line Insertion Start/End1/18/2022 7:00 AM Performed by: Thelma Comp, CRNA, CRNA  Patient location: Pre-op. Preanesthetic checklist: patient identified, IV checked, site marked, risks and benefits discussed, surgical consent, monitors and equipment checked, pre-op evaluation, timeout performed and anesthesia consent Lidocaine 1% used for infiltration Right, radial was placed Catheter size: 20 Fr Hand hygiene performed  and maximum sterile barriers used   Attempts: 1 Procedure performed without using ultrasound guided technique. Following insertion, dressing applied and Biopatch. Post procedure assessment: normal and unchanged  Patient tolerated the procedure well with no immediate complications.

## 2020-03-08 NOTE — Progress Notes (Signed)
  Echocardiogram Echocardiogram Transesophageal has been performed.  Geoffery Lyons Swaim 03/08/2020, 9:02 AM

## 2020-03-08 NOTE — Transfer of Care (Signed)
Immediate Anesthesia Transfer of Care Note  Patient: Colton Guzman  Procedure(s) Performed: CORONARY ARTERY BYPASS GRAFTING (CABG) x 5 ON PUMP (N/A Chest) TRANSESOPHAGEAL ECHOCARDIOGRAM (TEE) (N/A ) INDOCYANINE GREEN FLUORESCENCE IMAGING (ICG) (N/A ) ENDOVEIN HARVEST OF GREATER SAPHENOUS VEIN (Right ) RADIAL ARTERY HARVEST (Left Arm Lower)  Patient Location: SICU  Anesthesia Type:General  Level of Consciousness: Patient remains intubated per anesthesia plan  Airway & Oxygen Therapy: Patient remains intubated per anesthesia plan and Patient placed on Ventilator (see vital sign flow sheet for setting)  Post-op Assessment: Report given to RN and Post -op Vital signs reviewed and stable  Post vital signs: Reviewed and stable  Last Vitals:  Vitals Value Taken Time  BP 93/54   Temp 36.7 C 03/08/20 1435  Pulse 81 03/08/20 1435  Resp 12 03/08/20 1435  SpO2 100 % 03/08/20 1435  Vitals shown include unvalidated device data.  Last Pain:  Vitals:   03/08/20 0513  TempSrc: Oral  PainSc:          Complications: No complications documented.

## 2020-03-08 NOTE — Brief Op Note (Signed)
02/22/2020 - 03/08/2020  1:05 PM  PATIENT:  Colton Guzman  60 y.o. male  PRE-OPERATIVE DIAGNOSIS:  VT Arrest, Multivessel Coronary Artery Disease  POST-OPERATIVE DIAGNOSIS:  VT Arrest, Multivessel Coronary Artery Disease  PROCEDURE:   CORONARY ARTERY BYPASS GRAFTING (CABG) x 5 ON PUMP   LIMA->LAD  LRA->Ramus->OM  SVG->PDA  SVG->PLB of RCA  SVG harvest time 37mn  Vein prep time: 236m  TRANSESOPHAGEAL ECHOCARDIOGRAM (TEE) (N/A) INDOCYANINE GREEN FLUORESCENCE IMAGING (ICG) (N/A) ENDOVEIN HARVEST OF GREATER SAPHENOUS VEIN (Right) RADIAL ARTERY HARVEST (Left)  SURGEON:  AtWonda OldsMD - Primary  PHYSICIAN ASSISTANT: Destinae Neubecker    ASSISTANTS: KiCharlett NoseRNFA  ANESTHESIA:   general  EBL:  Per perfusion and anesthesia records  BLOOD ADMINISTERED:none  DRAINS: Left pleural and mediastinal tubes   LOCAL MEDICATIONS USED:  Left intercostal Exparel     SPECIMEN:  No Specimen  DISPOSITION OF SPECIMEN:  N/A  COUNTS:  YES  DICTATION: .Dragon Dictation  PLAN OF CARE: Admit to inpatient   PATIENT DISPOSITION:  ICU - intubated and hemodynamically stable.   Delay start of Pharmacological VTE agent (>24hrs) due to surgical blood loss or risk of bleeding: yes

## 2020-03-08 NOTE — Anesthesia Procedure Notes (Signed)
Anesthesia Procedure Image    

## 2020-03-08 NOTE — Plan of Care (Signed)
Patient undergoing CABG today, primary service cardiology and CTVS. TRH will sign off.   Estill Cotta M.D.  [facility] 03/08/2020, 9:40 AM

## 2020-03-08 NOTE — Progress Notes (Signed)
[facility] BRIEF SICU PROGRESS NOTE  Day of Surgery  S/P Procedure(s) (LRB): CORONARY ARTERY BYPASS GRAFTING (CABG) x 5 ON PUMP (N/A) TRANSESOPHAGEAL ECHOCARDIOGRAM (TEE) (N/A) INDOCYANINE GREEN FLUORESCENCE IMAGING (ICG) (N/A) ENDOVEIN HARVEST OF GREATER SAPHENOUS VEIN (Right) RADIAL ARTERY HARVEST (Left)   Sedated on vent NSR w/ stable hemodynamics, on NTG for hypertension O2 sats 100% Chest tube output low UOP >100 mL/hr Labs okay  Plan: Continue routine early postop  Rexene Alberts, MD 03/08/2020 5:09 PM

## 2020-03-08 NOTE — Anesthesia Procedure Notes (Signed)
Procedure Name: Intubation Date/Time: 03/08/2020 8:02 AM Performed by: Thelma Comp, CRNA Pre-anesthesia Checklist: Patient identified, Emergency Drugs available, Suction available and Patient being monitored Patient Re-evaluated:Patient Re-evaluated prior to induction Oxygen Delivery Method: Circle System Utilized Preoxygenation: Pre-oxygenation with 100% oxygen Induction Type: IV induction Ventilation: Mask ventilation without difficulty Laryngoscope Size: Mac and 4 Grade View: Grade I Tube type: Oral Tube size: 8.0 mm Number of attempts: 1 Airway Equipment and Method: Stylet and Oral airway Placement Confirmation: ETT inserted through vocal cords under direct vision,  positive ETCO2 and breath sounds checked- equal and bilateral Secured at: 23 cm Tube secured with: Tape Dental Injury: Teeth and Oropharynx as per pre-operative assessment

## 2020-03-08 NOTE — Progress Notes (Signed)
Progress Note  Patient Name: Colton Guzman Date of Encounter: 03/08/2020  [facility] Cardiologist: Freada Bergeron, MD   Subjective   Patient seen and examined post-CABG today. Doing well. Hoping to extubate.    Inpatient Medications    Scheduled Meds: . [MAR Hold] aspirin  81 mg Oral Daily  . bupivacaine liposome  20 mL Infiltration Once  . [MAR Hold] Chlorhexidine Gluconate Cloth  6 each Topical Q0600  . Chlorhexidine Gluconate Cloth  6 each Topical Once  . [MAR Hold] docusate  100 mg Per Tube BID  . epinephrine  0-10 mcg/min Intravenous To OR  . [MAR Hold] feeding supplement (GLUCERNA SHAKE)  237 mL Oral BID BM  . [MAR Hold] furosemide  40 mg Intravenous Daily  . heparin-papaverine-plasmalyte irrigation   Irrigation To OR  . [MAR Hold] insulin aspart  0-9 Units Subcutaneous Q4H  . [MAR Hold] insulin glargine  10 Units Subcutaneous BID  . insulin   Intravenous To OR  . [MAR Hold] magnesium oxide  400 mg Oral BID  . magnesium sulfate  40 mEq Other To OR  . [MAR Hold] metoprolol tartrate  50 mg Oral BID  . [MAR Hold] multivitamin with minerals  1 tablet Oral Daily  . [MAR Hold] mupirocin ointment  1 application Nasal BID  . [MAR Hold] pantoprazole  40 mg Oral Q1200  . phenylephrine  30-200 mcg/min Intravenous To OR  . [MAR Hold] polyethylene glycol  17 g Per Tube Daily  . potassium chloride  80 mEq Other To OR  . [MAR Hold] Ensure Max Protein  11 oz Oral QHS  . [MAR Hold] sodium chloride flush  10-40 mL Intracatheter Q12H  . [MAR Hold] sodium chloride flush  3 mL Intravenous Q12H  . tranexamic acid  15 mg/kg Intravenous To OR  . tranexamic acid  2 mg/kg Intracatheter To OR   Continuous Infusions: . [MAR Hold] sodium chloride 10 mL/hr at 03/02/20 1127  . [MAR Hold] sodium chloride    . cefUROXime (ZINACEF)  IV    . cefUROXime (ZINACEF)  IV    . dexmedetomidine    . heparin 30,000 units/NS 1000 mL solution for CELLSAVER    . milrinone    . nitroGLYCERIN     . norepinephrine    . tranexamic acid (CYKLOKAPRON) infusion ([facility])    . vancomycin     PRN Meds: [MAR Hold] sodium chloride, [MAR Hold] sodium chloride, [MAR Hold] alum & mag hydroxide-simeth, [MAR Hold] nitroGLYCERIN, [MAR Hold] phenol, [MAR Hold] sodium chloride flush, [MAR Hold] sodium chloride flush   Vital Signs    Vitals:   03/08/20 0731 03/08/20 0732 03/08/20 0733 03/08/20 0734  BP:      Pulse: 63 62 61   Resp: '14 16 18 13  '$ Temp:      TempSrc:      SpO2: 100% 99% 99%   Weight:      Height:        Intake/Output Summary (Last 24 hours) at 03/08/2020 0754 Last data filed at 03/07/2020 1901 Gross per 24 hour  Intake 1071 ml  Output 1300 ml  Net -229 ml   Last 3 Weights 03/07/2020 03/06/2020 03/05/2020  Weight (lbs) 178 lb 8 oz 182 lb 183 lb 12.8 oz  Weight (kg) 80.967 kg 82.555 kg 83.371 kg      Telemetry    NSR/sinus brady - Personally Reviewed  ECG    No new tracing - Personally Reviewed  Physical Exam   GEN:  Intubated, but opens eyes to voice  Neck: CVL in place Cardiac: RRR, no murmurs, rubs, or gallops. Sternotomy site c/d/i. CT in place Respiratory: Coarse vent sounds GI: Soft, nontender, non-distended  MS: No edema; No deformity. Warm. Neuro:  Opens eyes to voice. Intubated Psych: Not able to assess   Labs    High Sensitivity Troponin:   Recent Labs  Lab 02/24/20 0432 02/24/20 1124 02/24/20 1803 02/24/20 2000 03/01/20 0624  TROPONINIHS 12,216* 15,553* 12,157* 9,517* 942*      Chemistry Recent Labs  Lab 03/06/20 0339 03/07/20 1029 03/08/20 0405  NA 136 133* 135  K 4.4 4.1 3.7  CL 103 101 101  CO2 26 21* 22  GLUCOSE 162* 290* 178*  BUN '8 12 12  '$ CREATININE 0.87 0.90 0.96  CALCIUM 8.5* 8.7* 9.0  PROT 6.7 7.6 7.3  ALBUMIN 2.6* 3.1* 3.0*  AST 47* 63* 49*  ALT 72* 80* 70*  ALKPHOS 83 95 100  BILITOT 0.9 1.2 1.1  GFRNONAA >60 >60 >60  ANIONGAP '7 11 12     '$ Hematology Recent Labs  Lab 03/05/20 0319 03/07/20 1029 03/08/20 0405   WBC 6.7 7.8 7.2  RBC 3.87* 4.10* 3.96*  HGB 11.4* 12.4* 11.9*  HCT 35.8* 37.4* 36.5*  MCV 92.5 91.2 92.2  MCH 29.5 30.2 30.1  MCHC 31.8 33.2 32.6  RDW 16.4* 17.0* 16.9*  PLT 266 349 306    BNPNo results for input(s): BNP, PROBNP in the last 168 hours.   DDimer No results for input(s): DDIMER in the last 168 hours.   Radiology    CT CHEST WO CONTRAST  Result Date: 03/07/2020 CLINICAL DATA:  Preop for CABG, assess for aortopathy EXAM: CT CHEST WITHOUT CONTRAST TECHNIQUE: Multidetector CT imaging of the chest was performed following the standard protocol without IV contrast. COMPARISON:  CTA abdomen February 22, 2020. FINDINGS: Cardiovascular: Three-vessel coronary artery calcifications. No pericardial effusion. Aortic atherosclerosis. Aortic measurements are as follows: Ascending aorta measures 3.5 cm, the aortic arch measures 2.8 cm and descending aorta measures 2.4 cm. Aortic atherosclerosis. No pericardial effusion. Mediastinum/Nodes: No enlarged mediastinal or axillary lymph nodes. Thyroid gland, trachea, and esophagus demonstrate no significant findings. Measurements from: -Manubrium to left brachiocephalic, 7 mm -Sternal body to ascending aorta 30 mm -Sternum/xiphoid to right ventricle, 8 mm Lungs/Pleura: Multifocal patchy and nodular ground-glass opacities involving the apical left upper lobe, anterior left upper lobe, and apical right upper lobe. Linear band of atelectasis in the lingula. Small left pleural effusion with adjacent atelectasis. Upper Abdomen: Mild diffuse hepatic steatosis.  Cholelithiasis. Musculoskeletal: Multilevel degenerative changes spine. No suspicious lytic or blastic lesion of bone. No acute osseous abnormality. IMPRESSION: 1. No thoracic aortic aneurysm.  Measurements as above. 2. Multifocal patchy/nodular ground-glass opacities involving the apical left upper lobe, anterior left upper lobe, and apical right upper lobe. Findings are favored to represent an infectious  or inflammatory process. Recommend follow-up chest CT in 3 months to ensure resolution. 3. Small left pleural effusion with adjacent atelectasis. 4. Three-vessel coronary artery calcifications and aortic atherosclerosis. Aortic Atherosclerosis (ICD10-I70.0). Electronically Signed   By: Dahlia Bailiff MD   On: 03/07/2020 10:45   DG CHEST PORT 1 VIEW  Result Date: 03/07/2020 CLINICAL DATA:  Preop evaluation for upcoming coronary bypass grafting EXAM: PORTABLE CHEST 1 VIEW COMPARISON:  02/28/2020 FINDINGS: Cardiac shadow is within normal limits. The lungs are well aerated bilaterally. No focal infiltrate or sizable effusion is seen. No bony abnormality is noted. IMPRESSION: No acute abnormality noted. Electronically  Signed   By: Inez Catalina M.D.   On: 03/07/2020 21:53    Cardiac Studies   Echocardiogram 02/25/2020: Impressions: 1. LVEF 50-55% with wall motion abnormalities suspicious for mid LAD. No  apical thrombus.  2. Left ventricular ejection fraction, by estimation, is 50 to 55%. The  left ventricle has low normal function. The left ventricle demonstrates  regional wall motion abnormalities (see scoring diagram/findings for  description). Left ventricular diastolic  parameters are consistent with Grade II diastolic dysfunction  (pseudonormalization). Elevated left atrial pressure.  3. Right ventricular systolic function is normal. The right ventricular  size is normal. There is moderately elevated pulmonary artery systolic  pressure. The estimated right ventricular systolic pressure is Q000111Q mmHg.  4. Left atrial size was mildly dilated.  5. The mitral valve is normal in structure. Moderate mitral valve  regurgitation. No evidence of mitral stenosis.  6. The aortic valve is normal in structure. Aortic valve regurgitation is  not visualized. Mild to moderate aortic valve sclerosis/calcification is  present, without any evidence of aortic stenosis.  7. The inferior vena cava is  normal in size with greater than 50%  respiratory variability, suggesting right atrial pressure of 3 mmHg.    Diagnostic cath 03/04/20 Dominance: Right     Patient Profile     60 y.o. male with a history of hypertension and type 2 diabetes but no known cardiac history who was admitted on 02/22/2020 for further evaluation of hematochezia with hospital course complicated by chest pain c/b VT arrest requiring shock and CPR x2 minutes.  Trop >15,000 with concern for ischemic etiology prompting cardiology consult. Once stabilized from GIB standpoint, patient underwent coronary angiography found to have multivessel CAD now awaiting CABG.  Assessment & Plan    #Multivessel CAD #VT Arrest #NSTEMI: Patient presented with hematochezia with course complicated by chest pain with VT arrest on 01/04 requiring shock and 2-2mn of CPR with return of ROSC. High sensitivity troponin >15,000. EKG with Q waves in V1-V2 and as well as slight ST depression in V4-V6 and lead II. TTE with LVEF of 50-55% with hypokinesis of mid and distal anterior wall, mid and distal anterior septum, and apex as well as grade 2 diastolic dysfunction. Moderate MR and moderately elevated PASP of 48.7 mmHg. He was stabilized from GIB standpoint with successful extubation on 1/11 and underwent coronary angiography found to have multivessel CAD. Underwent CABG today. -S/p CABG today with LIMA->LAD, LRA->Ramus->OM, SVG->PDA,  SVG->PLB of RCA -Doing well post-op--hoping to extubate -Chest tube management per CV surgery -Continue ASA and statin -Will add GDMT as tolerated post-op  #Acute Hypoxic Respiratory Failure Post-Cardiac Arrest #Suspected Aspiration due to Cardiac Arrest #Acute pulmonary edema: Improved. Intubated for procedure. -ET tube management per primary team -Holding diuresis for now  #Paroxysmal Afib: Post-arrest rhythm demonstrated Afib with RVR. Newly diagnosed. Holding AC due to recent significant GIB. May need  to consider in the future once clinically improved. CHADs-vasc 3 with 4% annual risk of stroke. -Remains in NSR currently -Continue lopressor as above -Holding AC due to recent GIB; may need to reassess going forward given elevated CHADs-vasc -Likely needs long-term monitor to determine burden of Afib to [facility]  #HTN: -Add back agents as tolerated post-op  #HLD: -Resumed statin; LFTs improved  #GIB likely Secondary to Diverticular Bleed: - Colonoscopy on 1/8 showed red clotted blood in the entire colon with multiple diverticula throughout. Felt to be from diverticular bleed. - EGD on 1/8 negative for bleeding  or ulcer.  - Hemoglobin as low as 7.2 this admission. S/p a total of 7 units of PRBCs (last received 4 units on 1/8) Stable in 11's currently -No recurrent bleeding.  - Management per GI  For questions or updates, please contact [place] Please consult [email or web address] for contact info under        Signed, Freada Bergeron, MD  03/08/2020, 7:54 AM

## 2020-03-08 NOTE — Anesthesia Preprocedure Evaluation (Signed)
Anesthesia Evaluation  Patient identified by MRN, date of birth, ID band Patient awake    Reviewed: Allergy & Precautions, H&P , NPO status , Patient's Chart, lab work & pertinent test results  Airway Mallampati: II  TM Distance: >3 FB Neck ROM: Full    Dental no notable dental hx.    Pulmonary neg pulmonary ROS,    Pulmonary exam normal breath sounds clear to auscultation       Cardiovascular hypertension, + CAD and + Past MI  Normal cardiovascular exam Rhythm:Regular Rate:Normal  Left Ventricle: Left ventricular ejection fraction, by estimation, is 50  to 55%. The left ventricle has low normal function. The left ventricle  demonstrates regional wall motion abnormalities. Definity contrast agent  was given IV to delineate the left  ventricular endocardial borders. The left ventricular internal cavity size  was normal in size. There is no left ventricular hypertrophy. Left  ventricular diastolic parameters are consistent with Grade II diastolic  dysfunction (pseudonormalization).  Elevated left atrial pressure.     LV Wall Scoring:  The mid and distal anterior wall, mid and distal anterior septum, and apex  are hypokinetic.   Right Ventricle: The right ventricular size is normal. No increase in  right ventricular wall thickness. Right ventricular systolic function is  normal. There is moderately elevated pulmonary artery systolic pressure.  The tricuspid regurgitant velocity is  3.19 m/s, and with an assumed right atrial pressure of 8 mmHg, the  estimated right ventricular systolic pressure is Q000111Q mmHg.   Left Atrium: Left atrial size was mildly dilated.   Right Atrium: Right atrial size was normal in size.   Pericardium: There is no evidence of pericardial effusion.   Mitral Valve: The mitral valve is normal in structure. Moderate mitral  valve regurgitation. No evidence of mitral valve stenosis.   Tricuspid Valve:  The tricuspid valve is normal in structure. Tricuspid  valve regurgitation is mild . No evidence of tricuspid stenosis.   Aortic Valve: The aortic valve is normal in structure. Aortic valve  regurgitation is not visualized. Mild to moderate aortic valve  sclerosis/calcification is present, without any evidence of aortic  stenosis.   Pulmonic Valve: The pulmonic valve was normal in structure. Pulmonic valve  regurgitation is not visualized. No evidence of pulmonic stenosis.    Neuro/Psych negative neurological ROS  negative psych ROS   GI/Hepatic negative GI ROS, Neg liver ROS,   Endo/Other  negative endocrine ROSdiabetes, Poorly Controlled  Renal/GU negative Renal ROS  negative genitourinary   Musculoskeletal negative musculoskeletal ROS (+)   Abdominal   Peds negative pediatric ROS (+)  Hematology  (+) anemia ,   Anesthesia Other Findings   Reproductive/Obstetrics negative OB ROS                             Anesthesia Physical Anesthesia Plan  ASA: IV  Anesthesia Plan: General   Post-op Pain Management:    Induction: Intravenous  PONV Risk Score and Plan: 0  Airway Management Planned: Oral ETT  Additional Equipment: Arterial line, CVP, PA Cath, TEE and Ultrasound Guidance Line Placement  Intra-op Plan:   Post-operative Plan: Extubation in OR and Post-operative intubation/ventilation  Informed Consent: I have reviewed the patients History and Physical, chart, labs and discussed the procedure including the risks, benefits and alternatives for the proposed anesthesia with the patient or authorized representative who has indicated his/her understanding and acceptance.     Dental advisory given  Plan Discussed with: CRNA and Surgeon  Anesthesia Plan Comments:         Anesthesia Quick Evaluation

## 2020-03-08 NOTE — Progress Notes (Signed)
Dr Roxy Manns paged regarding patient only reaching -15 on NIF. Pre extubation ABG within parameters as well as VC. MD stated it was okay to proceed with extubation.

## 2020-03-08 NOTE — Progress Notes (Signed)
Dr Orvan Seen paged regarding persistent hypertension despite upwards titration of Nitroglycerin and PRN Metoprolol given. Orders received to add Nicardipine gtt as needed to keep MAP in the 80's.

## 2020-03-08 NOTE — Procedures (Signed)
Extubation Procedure Note  Patient Details:   Name: Colton Guzman DOB: 1960/07/10 MRN: [id number]   Airway Documentation:    Vent end date: 03/08/20 Vent end time: 1825   Evaluation  O2 sats: stable throughout Complications: No apparent complications Patient did tolerate procedure well. Bilateral Breath Sounds: Rhonchi   Yes   Patient extubated to 2L nasal cannula.  Positive cuff leak noted.  No evidence of stridor.  Patient able to speak post extubation.  Sats currently 99%.  Vitals are stable.  VC of 629m.  NIF of -15 however MD stated that extubation could occur.  No complications noted at this time.    BJudith Part1/18/2022, 6:30 PM

## 2020-03-08 NOTE — Anesthesia Postprocedure Evaluation (Signed)
Anesthesia Post Note  Patient: Colton Guzman  Procedure(s) Performed: CORONARY ARTERY BYPASS GRAFTING (CABG) x 5 ON PUMP (N/A Chest) TRANSESOPHAGEAL ECHOCARDIOGRAM (TEE) (N/A ) INDOCYANINE GREEN FLUORESCENCE IMAGING (ICG) (N/A ) ENDOVEIN HARVEST OF GREATER SAPHENOUS VEIN (Right ) RADIAL ARTERY HARVEST (Left Arm Lower)     Patient location during evaluation: SICU Anesthesia Type: General Level of consciousness: sedated Pain management: pain level controlled Vital Signs Assessment: post-procedure vital signs reviewed and stable Respiratory status: patient remains intubated per anesthesia plan Cardiovascular status: stable Postop Assessment: no apparent nausea or vomiting Anesthetic complications: no   No complications documented.  Last Vitals:  Vitals:   03/08/20 1720 03/08/20 1744  BP:    Pulse:    Resp:    Temp:    SpO2: 100% 100%    Last Pain:  Vitals:   03/08/20 1500  TempSrc: Core  PainSc:                  Adilynne Fitzwater S

## 2020-03-08 NOTE — H&P (Signed)
History and Physical Interval Note:  03/08/2020 7:28 AM  Colton Guzman  has presented today for surgery, with the diagnosis of VT Arrest.  The various methods of treatment have been discussed with the patient and family. After consideration of risks, benefits and other options for treatment, the patient has consented to  Procedure(s): CORONARY ARTERY BYPASS GRAFTING (CABG) POSSIBLE  BILAT IMA HARVESTING POSSIBLE LEFT RADIAL ARTERY HARVESTING (N/A) TRANSESOPHAGEAL ECHOCARDIOGRAM (TEE) (N/A) INDOCYANINE GREEN FLUORESCENCE IMAGING (ICG) (N/A) as a surgical intervention.  The patient's history has been reviewed, patient examined, no change in status, stable for surgery.  I have reviewed the patient's chart and labs.  Questions were answered to the patient's satisfaction.     Wonda Olds

## 2020-03-09 ENCOUNTER — Inpatient Hospital Stay (HOSPITAL_COMMUNITY): Payer: Managed Care, Other (non HMO)

## 2020-03-09 ENCOUNTER — Encounter (HOSPITAL_COMMUNITY): Payer: Self-pay | Admitting: Cardiothoracic Surgery

## 2020-03-09 DIAGNOSIS — I469 Cardiac arrest, cause unspecified: Secondary | ICD-10-CM | POA: Diagnosis not present

## 2020-03-09 DIAGNOSIS — I214 Non-ST elevation (NSTEMI) myocardial infarction: Secondary | ICD-10-CM | POA: Diagnosis not present

## 2020-03-09 LAB — BASIC METABOLIC PANEL
Anion gap: 7 (ref 5–15)
Anion gap: 8 (ref 5–15)
BUN: 9 mg/dL (ref 6–20)
BUN: 8 mg/dL (ref 6–20)
CO2: 21 mmol/L — ABNORMAL LOW (ref 22–32)
CO2: 21 mmol/L — ABNORMAL LOW (ref 22–32)
Calcium: 7.7 mg/dL — ABNORMAL LOW (ref 8.9–10.3)
Calcium: 8.1 mg/dL — ABNORMAL LOW (ref 8.9–10.3)
Chloride: 108 mmol/L (ref 98–111)
Chloride: 106 mmol/L (ref 98–111)
Creatinine, Ser: 0.66 mg/dL (ref 0.61–1.24)
Creatinine, Ser: 0.63 mg/dL (ref 0.61–1.24)
GFR, Estimated: >60 mL/min (ref >60)
GFR, Estimated: >60 mL/min (ref >60)
Glucose, Bld: 110 mg/dL — ABNORMAL HIGH (ref 70–99)
Glucose, Bld: 188 mg/dL — ABNORMAL HIGH (ref 70–99)
Potassium: 3.9 mmol/L (ref 3.5–5.1)
Potassium: 3.6 mmol/L (ref 3.5–5.1)
Sodium: 136 mmol/L (ref 135–145)
Sodium: 135 mmol/L (ref 135–145)

## 2020-03-09 LAB — GLUCOSE, CAPILLARY
Glucose-Capillary: 102 mg/dL — ABNORMAL HIGH (ref 70–99)
Glucose-Capillary: 109 mg/dL — ABNORMAL HIGH (ref 70–99)
Glucose-Capillary: 116 mg/dL — ABNORMAL HIGH (ref 70–99)
Glucose-Capillary: 124 mg/dL — ABNORMAL HIGH (ref 70–99)
Glucose-Capillary: 130 mg/dL — ABNORMAL HIGH (ref 70–99)
Glucose-Capillary: 138 mg/dL — ABNORMAL HIGH (ref 70–99)
Glucose-Capillary: 140 mg/dL — ABNORMAL HIGH (ref 70–99)
Glucose-Capillary: 146 mg/dL — ABNORMAL HIGH (ref 70–99)
Glucose-Capillary: 157 mg/dL — ABNORMAL HIGH (ref 70–99)
Glucose-Capillary: 159 mg/dL — ABNORMAL HIGH (ref 70–99)
Glucose-Capillary: 162 mg/dL — ABNORMAL HIGH (ref 70–99)
Glucose-Capillary: 173 mg/dL — ABNORMAL HIGH (ref 70–99)
Glucose-Capillary: 207 mg/dL — ABNORMAL HIGH (ref 70–99)
Glucose-Capillary: 207 mg/dL — ABNORMAL HIGH (ref 70–99)

## 2020-03-09 LAB — CBC
HCT: 24.4 % — ABNORMAL LOW (ref 39.0–52.0)
HCT: 26.1 % — ABNORMAL LOW (ref 39.0–52.0)
Hemoglobin: 7.8 g/dL — ABNORMAL LOW (ref 13.0–17.0)
Hemoglobin: 8.1 g/dL — ABNORMAL LOW (ref 13.0–17.0)
MCH: 30.6 pg (ref 26.0–34.0)
MCH: 30 pg (ref 26.0–34.0)
MCHC: 32 g/dL (ref 30.0–36.0)
MCHC: 31 g/dL (ref 30.0–36.0)
MCV: 95.7 fL (ref 80.0–100.0)
MCV: 96.7 fL (ref 80.0–100.0)
Platelets: 140 10*3/uL — ABNORMAL LOW (ref 150–400)
Platelets: 151 10*3/uL (ref 150–400)
RBC: 2.55 MIL/uL — ABNORMAL LOW (ref 4.22–5.81)
RBC: 2.7 MIL/uL — ABNORMAL LOW (ref 4.22–5.81)
RDW: 17.1 % — ABNORMAL HIGH (ref 11.5–15.5)
RDW: 17.4 % — ABNORMAL HIGH (ref 11.5–15.5)
WBC: 9.7 10*3/uL (ref 4.0–10.5)
WBC: 11.2 10*3/uL — ABNORMAL HIGH (ref 4.0–10.5)
nRBC: 0 % (ref 0.0–0.2)
nRBC: 0 % (ref 0.0–0.2)

## 2020-03-09 LAB — MAGNESIUM
Magnesium: 2.4 mg/dL (ref 1.7–2.4)
Magnesium: 2.1 mg/dL (ref 1.7–2.4)

## 2020-03-09 MED ORDER — INSULIN ASPART 100 UNIT/ML ~~LOC~~ SOLN
0.0000 [IU] | SUBCUTANEOUS | Status: DC
  Administered 2020-03-09 (×2): 2 [IU] via SUBCUTANEOUS
  Administered 2020-03-09 – 2020-03-10 (×2): 3 [IU] via SUBCUTANEOUS
  Administered 2020-03-10 (×2): 2 [IU] via SUBCUTANEOUS
  Administered 2020-03-10: 3 [IU] via SUBCUTANEOUS
  Administered 2020-03-10: 2 [IU] via SUBCUTANEOUS
  Administered 2020-03-11: 3 [IU] via SUBCUTANEOUS
  Administered 2020-03-11: 2 [IU] via SUBCUTANEOUS
  Administered 2020-03-11: 1 [IU] via SUBCUTANEOUS
  Administered 2020-03-11: 2 [IU] via SUBCUTANEOUS

## 2020-03-09 MED ORDER — INSULIN DETEMIR 100 UNIT/ML ~~LOC~~ SOLN
6.0000 [IU] | Freq: Two times a day (BID) | SUBCUTANEOUS | Status: DC
  Administered 2020-03-09 – 2020-03-10 (×3): 6 [IU] via SUBCUTANEOUS
  Filled 2020-03-09 (×4): qty 0.06

## 2020-03-09 MED ORDER — DIAZEPAM 2 MG PO TABS
2.0000 mg | ORAL_TABLET | Freq: Three times a day (TID) | ORAL | Status: DC
  Administered 2020-03-09 – 2020-03-10 (×4): 2 mg via ORAL
  Filled 2020-03-09 (×4): qty 1

## 2020-03-09 MED ORDER — ISOSORBIDE DINITRATE 10 MG PO TABS
10.0000 mg | ORAL_TABLET | Freq: Three times a day (TID) | ORAL | Status: DC
  Administered 2020-03-09 – 2020-03-13 (×13): 10 mg via ORAL
  Filled 2020-03-09 (×13): qty 1

## 2020-03-09 MED ORDER — THIAMINE HCL 100 MG/ML IJ SOLN
Freq: Once | INTRAVENOUS | Status: AC
  Filled 2020-03-09: qty 1000

## 2020-03-09 MED ORDER — MUPIROCIN 2 % EX OINT
1.0000 "application " | TOPICAL_OINTMENT | Freq: Two times a day (BID) | CUTANEOUS | Status: DC
  Administered 2020-03-09 – 2020-03-13 (×8): 1 via NASAL
  Filled 2020-03-09: qty 22

## 2020-03-09 MED ORDER — COLCHICINE 0.3 MG HALF TABLET
0.3000 mg | ORAL_TABLET | Freq: Two times a day (BID) | ORAL | Status: DC
  Administered 2020-03-09 – 2020-03-13 (×9): 0.3 mg via ORAL
  Filled 2020-03-09 (×10): qty 1

## 2020-03-09 MED ORDER — ADULT MULTIVITAMIN W/MINERALS CH
1.0000 | ORAL_TABLET | Freq: Every day | ORAL | Status: DC
  Administered 2020-03-09 – 2020-03-13 (×5): 1 via ORAL
  Filled 2020-03-09 (×5): qty 1

## 2020-03-09 MED ORDER — PROSOURCE PLUS PO LIQD
30.0000 mL | Freq: Two times a day (BID) | ORAL | Status: DC
  Administered 2020-03-09 – 2020-03-13 (×7): 30 mL via ORAL
  Filled 2020-03-09 (×7): qty 30

## 2020-03-09 MED ORDER — POTASSIUM CHLORIDE CRYS ER 20 MEQ PO TBCR
20.0000 meq | EXTENDED_RELEASE_TABLET | ORAL | Status: AC
Start: 2020-03-09 — End: 2020-03-10
  Administered 2020-03-09 – 2020-03-10 (×3): 20 meq via ORAL
  Filled 2020-03-09 (×3): qty 1

## 2020-03-09 MED ORDER — GLUCERNA SHAKE PO LIQD
237.0000 mL | Freq: Two times a day (BID) | ORAL | Status: DC
  Administered 2020-03-09 – 2020-03-10 (×2): 237 mL via ORAL

## 2020-03-09 NOTE — Progress Notes (Signed)
EVENING ROUNDS NOTE :     [address]       [place] [address]             [phone]                 1 Day Post-Op Procedure(s) (LRB): CORONARY ARTERY BYPASS GRAFTING (CABG) x 5 ON PUMP (N/A) TRANSESOPHAGEAL ECHOCARDIOGRAM (TEE) (N/A) INDOCYANINE GREEN FLUORESCENCE IMAGING (ICG) (N/A) ENDOVEIN HARVEST OF GREATER SAPHENOUS VEIN (Right) RADIAL ARTERY HARVEST (Left)   Total Length of Stay:  LOS: 16 days  Events:   No events. Off Neo-Synephrine     BP 114/74 (BP Location: Right Arm)   Pulse 88   Temp 98.9 F (37.2 C) (Oral)   Resp (!) 30   Ht '5\' 8"'$  (1.727 m)   Wt 85.7 kg   SpO2 92%   BMI 28.73 kg/m   PAP: (20-36)/(8-22) 25/9 CO:  [5 L/min-5.5 L/min] 5.5 L/min CI:  [2.6 L/min/m2-2.8 L/min/m2] 2.8 L/min/m2     . sodium chloride Stopped (03/09/20 1031)  . sodium chloride    . sodium chloride 10 mL/hr at 03/08/20 2200  . cefUROXime (ZINACEF)  IV 1.5 g (03/09/20 1656)  . lactated ringers    . lactated ringers    . lactated ringers Stopped (03/09/20 1647)  . niCARDipine Stopped (03/08/20 1928)  . nitroGLYCERIN Stopped (03/09/20 1435)  . phenylephrine (NEO-SYNEPHRINE) Adult infusion Stopped (03/09/20 0925)    I/O last 3 completed shifts: In: 6660.8 [P.O.:600; I.V.:2850.2; Blood:475; Other:1000; IV Piggyback:1735.7] Out: MU:[id number]; Blood:800; Chest Tube:780]   CBC Latest Ref Rng & Units 03/09/2020 03/09/2020 03/08/2020  WBC 4.0 - 10.5 K/uL 11.2(H) 9.7 -  Hemoglobin 13.0 - 17.0 g/dL 8.1(L) 7.8(L) 8.5(L)  Hematocrit 39.0 - 52.0 % 26.1(L) 24.4(L) 25.0(L)  Platelets 150 - 400 K/uL 151 140(L) -    BMP Latest Ref Rng & Units 03/09/2020 03/09/2020 03/08/2020  Glucose 70 - 99 mg/dL 188(H) 110(H) -  BUN 6 - 20 mg/dL 8 9 -  Creatinine 0.61 - 1.24 mg/dL 0.63 0.66 -  Sodium 135 - 145 mmol/L 135 136 142  Potassium 3.5 - 5.1 mmol/L 3.6 3.9 4.1  Chloride 98 - 111 mmol/L 106 108 -  CO2 22 - 32 mmol/L 21(L) 21(L) -  Calcium 8.9 - 10.3 mg/dL 8.1(L)  7.7(L) -    ABG    Component Value Date/Time   PHART 7.374 03/08/2020 1948   PCO2ART 39.5 03/08/2020 1948   PO2ART 104 03/08/2020 1948   HCO3 23.0 03/08/2020 1948   TCO2 24 03/08/2020 1948   ACIDBASEDEF 2.0 03/08/2020 1948   O2SAT 98.0 03/08/2020 1948       Melodie Bouillon, MD 03/09/2020 7:35 PM

## 2020-03-09 NOTE — Progress Notes (Signed)
Progress Note  Patient Name: Colton Guzman Date of Encounter: 03/09/2020  [facility] Cardiologist: Freada Bergeron, MD   Subjective   Doing well post-operatively. Awake and interactive this AM. Has some pain at the incision sites but otherwise doing well.  Successfully extubated yesterday evening  MAPs stable 60s; phenylephrine off currently Cr stable at 0.66; brisk UOP with 4L urine total CT with 600cc output overnight  Inpatient Medications    Scheduled Meds: . acetaminophen  1,000 mg Oral Q6H   Or  . acetaminophen (TYLENOL) oral liquid 160 mg/5 mL  1,000 mg Per Tube Q6H  . aspirin EC  325 mg Oral Daily   Or  . aspirin  324 mg Per Tube Daily  . atorvastatin  80 mg Oral Daily  . bisacodyl  10 mg Oral Daily   Or  . bisacodyl  10 mg Rectal Daily  . chlorhexidine  15 mL Mouth Rinse BID  . Chlorhexidine Gluconate Cloth  6 each Topical Daily  . Chlorhexidine Gluconate Cloth  6 each Topical Daily  . docusate sodium  200 mg Oral Daily  . mouth rinse  15 mL Mouth Rinse q12n4p  . metoprolol tartrate  12.5 mg Oral BID   Or  . metoprolol tartrate  12.5 mg Per Tube BID  . [START ON 03/10/2020] pantoprazole  40 mg Oral Daily  . sodium chloride flush  10-40 mL Intracatheter Q12H  . sodium chloride flush  3 mL Intravenous Q12H   Continuous Infusions: . sodium chloride 20 mL/hr at 03/09/20 0700  . sodium chloride    . sodium chloride 10 mL/hr at 03/08/20 2200  . albumin human Stopped (03/09/20 0112)  . cefUROXime (ZINACEF)  IV Stopped (03/09/20 0545)  . dexmedetomidine (PRECEDEX) IV infusion 0.2 mcg/kg/hr (03/09/20 0700)  . insulin 2 mL/hr at 03/09/20 0700  . lactated ringers    . lactated ringers    . lactated ringers 20 mL/hr at 03/09/20 0700  . niCARDipine Stopped (03/08/20 1928)  . nitroGLYCERIN 10 mcg/min (03/09/20 0700)  . phenylephrine (NEO-SYNEPHRINE) Adult infusion 10 mcg/min (03/09/20 0700)   PRN Meds: sodium chloride, albumin human, dextrose, lactated  ringers, metoprolol tartrate, midazolam, morphine injection, ondansetron (ZOFRAN) IV, oxyCODONE, sodium chloride flush, sodium chloride flush, traMADol   Vital Signs    Vitals:   03/09/20 0645 03/09/20 0700 03/09/20 0715 03/09/20 0730  BP:  112/68    Pulse: 80 80 80   Resp: (!) '26 18 17   '$ Temp: 98.96 F (37.2 C) 98.96 F (37.2 C) 98.78 F (37.1 C) 99 F (37.2 C)  TempSrc:    Core  SpO2: 98% 96% 96%   Weight:      Height:        Intake/Output Summary (Last 24 hours) at 03/09/2020 0748 Last data filed at 03/09/2020 0700 Gross per 24 hour  Intake 5016.18 ml  Output 5465 ml  Net -448.82 ml   Last 3 Weights 03/09/2020 03/07/2020 03/06/2020  Weight (lbs) 188 lb 15 oz 178 lb 8 oz 182 lb  Weight (kg) 85.7 kg 80.967 kg 82.555 kg      Telemetry    NSR with HR 80s - Personally Reviewed  ECG    Sinus bradycardia - Personally Reviewed  Physical Exam   GEN: Sitting up in bed, alert and interactive Neck: PA line in place Cardiac: RRR, no murmurs, rubs, or gallops. Sternotomy site with wound vac in place Respiratory: Clear to auscultation bilaterally on anterior exam. CT in place. GI: Soft, nontender,  non-distended  MS: No edema. Left wrist bandaged which is c/d/i Neuro:  Nonfocal  Psych: Normal affect   Labs    High Sensitivity Troponin:   Recent Labs  Lab 02/24/20 0432 02/24/20 1124 02/24/20 1803 02/24/20 2000 03/01/20 0624  TROPONINIHS 12,216* 15,553* 12,157* 9,517* 942*      Chemistry Recent Labs  Lab 03/06/20 0339 03/07/20 1029 03/08/20 0405 03/08/20 0812 03/08/20 1336 03/08/20 1454 03/08/20 1944 03/08/20 1948 03/09/20 0409  NA 136 133* 135   < > 141   < > 138 142 136  K 4.4 4.1 3.7   < > 3.9   < > 4.0 4.1 3.9  CL 103 101 101   < > 106  --  109  --  108  CO2 26 21* 22  --   --   --  21*  --  21*  GLUCOSE 162* 290* 178*   < > 148*  --  138*  --  110*  BUN '8 12 12   '$ < > 8  --  8  --  9  CREATININE 0.87 0.90 0.96   < > 0.50*  --  0.71  --  0.66  CALCIUM  8.5* 8.7* 9.0  --   --   --  7.5*  --  7.7*  PROT 6.7 7.6 7.3  --   --   --   --   --   --   ALBUMIN 2.6* 3.1* 3.0*  --   --   --   --   --   --   AST 47* 63* 49*  --   --   --   --   --   --   ALT 72* 80* 70*  --   --   --   --   --   --   ALKPHOS 83 95 100  --   --   --   --   --   --   BILITOT 0.9 1.2 1.1  --   --   --   --   --   --   GFRNONAA >60 >60 >60  --   --   --  >60  --  >60  ANIONGAP '7 11 12  '$ --   --   --  8  --  7   < > = values in this interval not displayed.     Hematology Recent Labs  Lab 03/08/20 1426 03/08/20 1454 03/08/20 1944 03/08/20 1948 03/09/20 0409  WBC 18.8*  --  12.1*  --  9.7  RBC 3.08*  --  2.69*  --  2.55*  HGB 9.2*   < > 8.2* 8.5* 7.8*  HCT 29.6*   < > 25.3* 25.0* 24.4*  MCV 96.1  --  94.1  --  95.7  MCH 29.9  --  30.5  --  30.6  MCHC 31.1  --  32.4  --  32.0  RDW 16.9*  --  17.1*  --  17.1*  PLT 182  --  154  --  140*   < > = values in this interval not displayed.    BNPNo results for input(s): BNP, PROBNP in the last 168 hours.   DDimer No results for input(s): DDIMER in the last 168 hours.   Radiology    CT CHEST WO CONTRAST  Result Date: 03/07/2020 CLINICAL DATA:  Preop for CABG, assess for aortopathy EXAM: CT CHEST WITHOUT CONTRAST TECHNIQUE: Multidetector CT imaging of the chest was  performed following the standard protocol without IV contrast. COMPARISON:  CTA abdomen February 22, 2020. FINDINGS: Cardiovascular: Three-vessel coronary artery calcifications. No pericardial effusion. Aortic atherosclerosis. Aortic measurements are as follows: Ascending aorta measures 3.5 cm, the aortic arch measures 2.8 cm and descending aorta measures 2.4 cm. Aortic atherosclerosis. No pericardial effusion. Mediastinum/Nodes: No enlarged mediastinal or axillary lymph nodes. Thyroid gland, trachea, and esophagus demonstrate no significant findings. Measurements from: -Manubrium to left brachiocephalic, 7 mm -Sternal body to ascending aorta 30 mm  -Sternum/xiphoid to right ventricle, 8 mm Lungs/Pleura: Multifocal patchy and nodular ground-glass opacities involving the apical left upper lobe, anterior left upper lobe, and apical right upper lobe. Linear band of atelectasis in the lingula. Small left pleural effusion with adjacent atelectasis. Upper Abdomen: Mild diffuse hepatic steatosis.  Cholelithiasis. Musculoskeletal: Multilevel degenerative changes spine. No suspicious lytic or blastic lesion of bone. No acute osseous abnormality. IMPRESSION: 1. No thoracic aortic aneurysm.  Measurements as above. 2. Multifocal patchy/nodular ground-glass opacities involving the apical left upper lobe, anterior left upper lobe, and apical right upper lobe. Findings are favored to represent an infectious or inflammatory process. Recommend follow-up chest CT in 3 months to ensure resolution. 3. Small left pleural effusion with adjacent atelectasis. 4. Three-vessel coronary artery calcifications and aortic atherosclerosis. Aortic Atherosclerosis (ICD10-I70.0). Electronically Signed   By: Dahlia Bailiff MD   On: 03/07/2020 10:45   DG Chest Port 1 View  Result Date: 03/09/2020 CLINICAL DATA:  Chest tube.  CABG. EXAM: PORTABLE CHEST 1 VIEW COMPARISON:  03/08/2020. FINDINGS: Interim extubation and removal of NG tube. Swan-Ganz catheter tip noted coiled in the left pulmonary artery in unchanged position. Mediastinal drainage tube and left chest tube in stable position. Prior median sternotomy. Heart size stable. Low lung volumes with mild left mid and bibasilar subsegmental atelectasis. No pleural effusion or pneumothorax. IMPRESSION: 1. Interim extubation and removal of NG tube. Swan-Ganz catheter tip noted coiled in the left pulmonary artery in unchanged position. Mediastinal drainage tube and left chest tube in stable position. No pneumothorax. 2.  Prior median sternotomy.  Stable cardiomegaly. 3.  Low lung volumes with left mid lung and bibasilar atelectasis.  Electronically Signed   By: Marcello Moores  Register   On: 03/09/2020 07:15   DG Chest Port 1 View  Result Date: 03/08/2020 CLINICAL DATA:  Status post coronary bypass graft. EXAM: PORTABLE CHEST 1 VIEW COMPARISON:  March 07, 2020. FINDINGS: Stable cardiomediastinal silhouette. Endotracheal and nasogastric tubes are in good position. Right internal jugular Swan-Ganz catheter is noted with tip directed into expected position of left pulmonary artery. Left-sided chest tube is noted without pneumothorax. Lungs are clear. Bony thorax is unremarkable. IMPRESSION: Endotracheal and nasogastric tubes are in good position. Left-sided chest tube is noted without pneumothorax. Right internal jugular Swan-Ganz catheter tip is directed into expected position of left pulmonary artery. Electronically Signed   By: Marijo Conception M.D.   On: 03/08/2020 15:07   DG CHEST PORT 1 VIEW  Result Date: 03/07/2020 CLINICAL DATA:  Preop evaluation for upcoming coronary bypass grafting EXAM: PORTABLE CHEST 1 VIEW COMPARISON:  02/28/2020 FINDINGS: Cardiac shadow is within normal limits. The lungs are well aerated bilaterally. No focal infiltrate or sizable effusion is seen. No bony abnormality is noted. IMPRESSION: No acute abnormality noted. Electronically Signed   By: Inez Catalina M.D.   On: 03/07/2020 21:53   ECHO INTRAOPERATIVE TEE  Result Date: 03/08/2020  *INTRAOPERATIVE TRANSESOPHAGEAL REPORT *  Patient Name:   BALLARD THIBODEAUX Date of Exam: 03/08/2020  Medical Rec #:  [id number]      Height:       68.0 in Accession #:    [id number]     Weight:       178.5 lb Date of Birth:  Aug 28, 1960       BSA:          1.95 m Patient Age:    24 years       BP:           117/78 mmHg Patient Gender: M              HR:           64 bpm. Exam Location:  Anesthesiology Transesophogeal exam was perform intraoperatively during surgical procedure. Patient was closely monitored under general anesthesia during the entirety of examination. Indications:      Coronary artery disease Sonographer:     Vickie Epley RDCS Performing Phys: [id number] [facility] ATKINS Diagnosing Phys: Myrtie Soman MD Complications: No known complications during this procedure. POST-OP IMPRESSIONS - Left Ventricle: The left ventricle is unchanged from pre-bypass. - Right Ventricle: The right ventricle appears unchanged from pre-bypass. - Aorta: The aorta appears unchanged from pre-bypass. - Left Atrium: The left atrium appears unchanged from pre-bypass. - Left Atrial Appendage: The left atrial appendage appears unchanged from pre-bypass. - Aortic Valve: The aortic valve appears unchanged from pre-bypass. - Mitral Valve: The mitral valve appears unchanged from pre-bypass. - Tricuspid Valve: The tricuspid valve appears unchanged from pre-bypass. - Interatrial Septum: The interatrial septum appears unchanged from pre-bypass. - Interventricular Septum: The interventricular septum appears unchanged from pre-bypass. - Pericardium: The pericardium appears unchanged from pre-bypass. - Comments: anterior wall moving well post bypass apical segment which was hypokinetic appears to have better contractility now. PRE-OP FINDINGS  Left Ventricle: The left ventricle has low normal systolic function, with an ejection fraction of 50-55%. The cavity size was normal. There is no increase in left ventricular wall thickness. Right Ventricle: The right ventricle has normal systolic function. The cavity was normal. There is no increase in right ventricular wall thickness. Right ventricular systolic pressure is normal. Left Atrium: Left atrial size was normal in size. The left atrial appendage is well visualized and there is no evidence of thrombus present. Right Atrium: Right atrial size was normal in size. Interatrial Septum: No atrial level shunt detected by color flow Doppler. Pericardium: There is no evidence of pericardial effusion. Mitral Valve: The mitral valve is normal in structure. Mild thickening of the mitral  valve leaflet. Mitral valve regurgitation is mild by color flow Doppler. The MR jet is centrally-directed. There is No evidence of mitral stenosis. Tricuspid Valve: The tricuspid valve was normal in structure. Tricuspid valve regurgitation was not visualized by color flow Doppler. Aortic Valve: The aortic valve is normal in structure. There is moderate thickening of the aortic valve and There is moderate calcification of the aortic valve Aortic valve regurgitation is trivial by color flow Doppler. There is no evidence of aortic valve stenosis. Pulmonic Valve: The pulmonic valve was normal in structure. Pulmonic valve regurgitation is not visualized by color flow Doppler. Aorta: The aortic root and ascending aorta are normal in size and structure. +-------------+--------++ AORTIC VALVE          +-------------+--------++ AV Mean Grad:4.0 mmHg +-------------+--------++  Myrtie Soman MD Electronically signed by Myrtie Soman MD Signature Date/Time: 03/08/2020/4:26:01 PM    Final     Cardiac Studies   Echocardiogram 02/25/2020: Impressions: 1. LVEF 50-55% with  wall motion abnormalities suspicious for mid LAD. No  apical thrombus.  2. Left ventricular ejection fraction, by estimation, is 50 to 55%. The  left ventricle has low normal function. The left ventricle demonstrates  regional wall motion abnormalities (see scoring diagram/findings for  description). Left ventricular diastolic  parameters are consistent with Grade II diastolic dysfunction  (pseudonormalization). Elevated left atrial pressure.  3. Right ventricular systolic function is normal. The right ventricular  size is normal. There is moderately elevated pulmonary artery systolic  pressure. The estimated right ventricular systolic pressure is [id number] mmHg.  4. Left atrial size was mildly dilated.  5. The mitral valve is normal in structure. Moderate mitral valve  regurgitation. No evidence of mitral stenosis.  6. The aortic valve is  normal in structure. Aortic valve regurgitation is  not visualized. Mild to moderate aortic valve sclerosis/calcification is  present, without any evidence of aortic stenosis.  7. The inferior vena cava is normal in size with greater than 50%  respiratory variability, suggesting right atrial pressure of 3 mmHg.    Diagnostic cath 03/04/20 Dominance: Right     Patient Profile     60 y.o. male with a history of hypertension and type 2 diabetes but no known cardiac history who was admitted on 02/22/2020 for further evaluation of hematocheziawith hospital course complicated by chest pain c/b VT arrest requiring shock and CPR x2 minutes. Trop >15,000 with concern for ischemic etiology prompting cardiology consult. Once stabilized from GIB standpoint, patient underwent coronary angiography found to have multivessel CAD now s/p CABG on 03/08/20.  Assessment & Plan    #Multivessel CAD #VT Arrest #NSTEMI: Patient presented with hematochezia with course complicated by chest pain with VT arrest on 01/04 requiring shock and 2-63mn of CPR with return of ROSC. High sensitivity troponin >15,000.EKGwithQ waves in V1-V2 and as well as slight ST depression in V4-V6 and lead II. TTE withLVEF of 50-55% with hypokinesis of mid and distal anterior wall, mid and distal anterior septum, and apex as well as grade 2 diastolic dysfunction. Moderate MR and moderately elevated PASP of 48.7 mmHg. He was stabilized from GIB standpoint with successful extubation on 1/11 and underwent coronary angiography found to have multivessel CAD. Underwent CABG on 03/08/20/ -S/p CABG on 1/18 with LIMA -> LAD, LRA -> Ramus -> OM, SVG -> PDA, SVG -> PLB of RCA -Doing well post-operatively; successfully extubated yesterday -Chest tube management per CV surgery -Continue ASA and statin -Will add GDMT as tolerated post-op  #Acute Hypoxic Respiratory Failure Post-Cardiac Arrest #Suspected Aspiration due to Cardiac  Arrest #Acute pulmonary edema: Improved and successfully diuresed. Was weaned to RA prior to surgery. Was intubated for CABG and successfully extubated on night of surgery. Now back on RA. -Stable; diuresis as needed  #Paroxysmal Afib: Post-arrest rhythm demonstrated Afib with RVR. Newly diagnosed. Holding AC due to recent significant GIB. May need to consider in the future once clinically improved. CHADs-vasc 3 with 4% annual risk of stroke. -Remains in NSR currently -Holding AC due to recent GIB; may need to reassess going forward given elevated CHADs-vasc -Likely needs long-term monitor to determine burden of Afib to better guide AC management in future  #HTN: -Add back agents as tolerated post-op  #HLD: -Resumed statin; LFTs improved  #GIB likely Secondary to Diverticular Bleed: - Colonoscopy on 1/8 showed red clotted blood in the entire colon with multiple diverticula throughout. Felt to be from diverticular bleed. - EGD on 1/8 negative for bleeding or ulcer.  - Hemoglobin  as low as 7.2 this admission. S/p a total of 7 units of PRBCs (last received 4 units on 1/8)Stable in 11's currently -No recurrent bleeding.  - Management perGI      For questions or updates, please contact [facility] Please consult [email or web address] for contact info under        Signed, Freada Bergeron, MD  03/09/2020, 7:48 AM

## 2020-03-09 NOTE — Discharge Instructions (Signed)

## 2020-03-09 NOTE — Progress Notes (Addendum)
Inpatient Diabetes Program Recommendations  AACE/ADA: New Consensus Statement on Inpatient Glycemic Control (2015)  Target Ranges:  Prepandial:   less than 140 mg/dL      Peak postprandial:   less than 180 mg/dL (1-2 hours)      Critically ill patients:  140 - 180 mg/dL   Lab Results  Component Value Date   GLUCAP 207 (H) 03/09/2020   HGBA1C 5.9 (H) 03/07/2020    Review of Glycemic Control  Diabetes history: DM2 Outpatient Diabetes medications: Glyburide 5 mg BID + Metformin 1000 mg BID Current orders for Inpatient glycemic control: IV insulin  Inpatient Diabetes Program Recommendations:   Consider: -Levemir 6 units bid (0.15 units/kg x 85.7 kg= 13 units) -Novolog 0-9 units q 4 hrs. Correction  Give basal insulin 2 hrs. Prior to D/C of insulin drip and cover CBG @ time IV insulin discontinued.  Discharge: -Consider D/C Glyburide & Metformin -Add Tradjenta 5 mg qd   Thank you, Nani Gasser. Linn Clavin, RN, MSN, CDE  Diabetes Coordinator Inpatient Glycemic Control Team Team Pager [phone] (8am-5pm) 03/09/2020 9:35 AM

## 2020-03-09 NOTE — Progress Notes (Signed)
1 Day Post-Op Procedure(s) (LRB): CORONARY ARTERY BYPASS GRAFTING (CABG) x 5 ON PUMP (N/A) TRANSESOPHAGEAL ECHOCARDIOGRAM (TEE) (N/A) INDOCYANINE GREEN FLUORESCENCE IMAGING (ICG) (N/A) ENDOVEIN HARVEST OF GREATER SAPHENOUS VEIN (Right) RADIAL ARTERY HARVEST (Left) Subjective: No complaints  Objective: Vital signs in last 24 hours: Temp:  [97.88 F (36.6 C)-99.68 F (37.6 C)] 99.3 F (37.4 C) (01/19 0900) Pulse Rate:  [61-81] 70 (01/19 0900) Cardiac Rhythm: Normal sinus rhythm (01/19 0730) Resp:  [13-30] 30 (01/19 0900) BP: (97-150)/(64-85) 129/80 (01/19 0900) SpO2:  [95 %-100 %] 98 % (01/19 0900) Arterial Line BP: (98-151)/(46-83) 138/75 (01/19 0900) FiO2 (%):  [40 %-50 %] 40 % (01/18 1744) Weight:  [85.7 kg] 85.7 kg (01/19 0500)  Hemodynamic parameters for last 24 hours: PAP: (20-36)/(10-22) 29/10 CO:  [3.6 L/min-5.8 L/min] 5.5 L/min CI:  [1.9 L/min/m2-3 L/min/m2] 2.8 L/min/m2  Intake/Output from previous day: 01/18 0701 - 01/19 0700 In: 5016.2 [P.O.:480; I.V.:2325.5; Blood:475; IV Piggyback:1735.7] Out: [id number] [Urine:4065; Blood:800; Chest Tube:600] Intake/Output this shift: Total I/O In: 236.2 [P.O.:120; I.V.:116.2] Out: 105 [Urine:75; Chest Tube:30]  General appearance: alert and cooperative Neurologic: intact Heart: regular rate and rhythm, S1, S2 normal, no murmur, click, rub or gallop Lungs: clear to auscultation bilaterally Abdomen: soft, non-tender; bowel sounds normal; no masses,  no organomegaly Extremities: extremities normal, atraumatic, no cyanosis or edema Wound: dressed  Lab Results: Recent Labs    03/08/20 1944 03/08/20 1948 03/09/20 0409  WBC 12.1*  --  9.7  HGB 8.2* 8.5* 7.8*  HCT 25.3* 25.0* 24.4*  PLT 154  --  140*   BMET:  Recent Labs    03/08/20 1944 03/08/20 1948 03/09/20 0409  NA 138 142 136  K 4.0 4.1 3.9  CL 109  --  108  CO2 21*  --  21*  GLUCOSE 138*  --  110*  BUN 8  --  9  CREATININE 0.71  --  0.66  CALCIUM 7.5*  --   7.7*    PT/INR:  Recent Labs    03/08/20 1426  LABPROT 17.0*  INR 1.4*   ABG    Component Value Date/Time   PHART 7.374 03/08/2020 1948   HCO3 23.0 03/08/2020 1948   TCO2 24 03/08/2020 1948   ACIDBASEDEF 2.0 03/08/2020 1948   O2SAT 98.0 03/08/2020 1948   CBG (last 3)  Recent Labs    03/09/20 0626 03/09/20 0736 03/09/20 0848  GLUCAP 146* 130* 207*    Assessment/Plan: S/P Procedure(s) (LRB): CORONARY ARTERY BYPASS GRAFTING (CABG) x 5 ON PUMP (N/A) TRANSESOPHAGEAL ECHOCARDIOGRAM (TEE) (N/A) INDOCYANINE GREEN FLUORESCENCE IMAGING (ICG) (N/A) ENDOVEIN HARVEST OF GREATER SAPHENOUS VEIN (Right) RADIAL ARTERY HARVEST (Left) Mobilize d/c pa cath  Out of bed Gentle resuscitation [place] for radial artery   LOS: 16 days    Colton Guzman 03/09/2020

## 2020-03-09 NOTE — Progress Notes (Signed)
Shift Summary:   N: AAOx4, flat affect and withdrawn, does not volunteer pain but does admit pain when asked, gave scheduled acetaminophen and prn '5mg'$  oxycodone x2 with reduction of pain.   R: RA, clear lungs, productive cough  CV: Started shift A paced, placed pacer at backup rate of AAI '@50'$  (mA 4) and not paced for remainder of shift. Able to wean off neo gtt, held metoprolol. Discontinued NTG gtt after Iohexol started.   GI: hypoactive bowel tones, no BM. Transitioned off insulin gtt and on long acting with SS coverage.   GU: Foley in place, moderate output.   Skin: No new skin issues, surgical sites CDI. Midsternal incision covered with wound vac, no output.   No labs of concern, wife at bedside. Ambulated around unit x2 and tolerated sitting in chair.   Worthy Boschert RN

## 2020-03-09 NOTE — Plan of Care (Signed)
Problem: Education: Goal: Knowledge of General Education information will improve Description: Including pain rating scale, medication(s)/side effects and non-pharmacologic comfort measures Outcome: Progressing   Problem: Health Behavior/Discharge Planning: Goal: Ability to manage health-related needs will improve Outcome: Progressing   Problem: Clinical Measurements: Goal: Ability to maintain clinical measurements within normal limits will improve Outcome: Progressing Goal: Will remain free from infection Outcome: Progressing Goal: Diagnostic test results will improve Outcome: Progressing Goal: Respiratory complications will improve Outcome: Progressing Goal: Cardiovascular complication will be avoided Outcome: Progressing   Problem: Activity: Goal: Risk for activity intolerance will decrease Outcome: Progressing   Problem: Nutrition: Goal: Adequate nutrition will be maintained Outcome: Progressing   Problem: Coping: Goal: Level of anxiety will decrease Outcome: Progressing   Problem: Elimination: Goal: Will not experience complications related to bowel motility Outcome: Progressing Goal: Will not experience complications related to urinary retention Outcome: Progressing   Problem: Pain Managment: Goal: General experience of comfort will improve Outcome: Progressing   Problem: Safety: Goal: Ability to remain free from injury will improve Outcome: Progressing   Problem: Skin Integrity: Goal: Risk for impaired skin integrity will decrease Outcome: Progressing   Problem: Education: Goal: Ability to identify signs and symptoms of gastrointestinal bleeding will improve Outcome: Progressing   Problem: Bowel/Gastric: Goal: Will show no signs and symptoms of gastrointestinal bleeding Outcome: Progressing   Problem: Fluid Volume: Goal: Will show no signs and symptoms of excessive bleeding Outcome: Progressing   Problem: Clinical Measurements: Goal:  Complications related to the disease process, condition or treatment will be avoided or minimized Outcome: Progressing   Problem: Education: Goal: Ability to identify signs and symptoms of gastrointestinal bleeding will improve Outcome: Progressing   Problem: Bowel/Gastric: Goal: Will show no signs and symptoms of gastrointestinal bleeding Outcome: Progressing   Problem: Fluid Volume: Goal: Will show no signs and symptoms of excessive bleeding Outcome: Progressing   Problem: Clinical Measurements: Goal: Complications related to the disease process, condition or treatment will be avoided or minimized Outcome: Progressing   Problem: Education: Goal: Ability to describe self-care measures that may prevent or decrease complications (Diabetes Survival Skills Education) will improve Outcome: Progressing Goal: Individualized Educational Video(s) Outcome: Progressing   Problem: Cardiac: Goal: Ability to maintain an adequate cardiac output will improve Outcome: Progressing   Problem: Health Behavior/Discharge Planning: Goal: Ability to identify and utilize available resources and services will improve Outcome: Progressing Goal: Ability to manage health-related needs will improve Outcome: Progressing   Problem: Fluid Volume: Goal: Ability to achieve a balanced intake and output will improve Outcome: Progressing   Problem: Metabolic: Goal: Ability to maintain appropriate glucose levels will improve Outcome: Progressing   Problem: Nutritional: Goal: Maintenance of adequate nutrition will improve Outcome: Progressing Goal: Maintenance of adequate weight for body size and type will improve Outcome: Progressing   Problem: Respiratory: Goal: Will regain and/or maintain adequate ventilation Outcome: Progressing   Problem: Urinary Elimination: Goal: Ability to achieve and maintain adequate renal perfusion and functioning will improve Outcome: Progressing   Problem:  Safety: Goal: Non-violent Restraint(s) Outcome: Progressing   Problem: Activity: Goal: Ability to tolerate increased activity will improve Outcome: Progressing   Problem: Respiratory: Goal: Ability to maintain a clear airway and adequate ventilation will improve Outcome: Progressing   Problem: Role Relationship: Goal: Method of communication will improve Outcome: Progressing   Problem: Education: Goal: Will demonstrate proper wound care and an understanding of methods to prevent future damage Outcome: Progressing Goal: Knowledge of disease or condition will improve Outcome: Progressing Goal: Knowledge of  the prescribed therapeutic regimen will improve Outcome: Progressing Goal: Individualized Educational Video(s) Outcome: Progressing   Problem: Activity: Goal: Risk for activity intolerance will decrease Outcome: Progressing   Problem: Cardiac: Goal: Will achieve and/or maintain hemodynamic stability Outcome: Progressing   Problem: Clinical Measurements: Goal: Postoperative complications will be avoided or minimized Outcome: Progressing   Problem: Respiratory: Goal: Respiratory status will improve Outcome: Progressing   Problem: Skin Integrity: Goal: Wound healing without signs and symptoms of infection Outcome: Progressing Goal: Risk for impaired skin integrity will decrease Outcome: Progressing   Problem: Urinary Elimination: Goal: Ability to achieve and maintain adequate renal perfusion and functioning will improve Outcome: Progressing

## 2020-03-09 NOTE — Progress Notes (Signed)
Nutrition Follow-up  DOCUMENTATION CODES:   Not applicable  INTERVENTION:   -Glucerna Shake po BID, each supplement provides 220 kcal and 10 grams of protein -ProSource Plus 30 ml BID, each supplement provides 100 kcals and 15 grams protein -MVI daily   NUTRITION DIAGNOSIS:   Increased nutrient needs related to post-op healing as evidenced by estimated needs.  Ongoing  GOAL:   Patient will meet greater than or equal to 90% of their needs  Progressing   MONITOR:   PO intake,Supplement acceptance,Labs,Weight trends,Skin,I & O's  REASON FOR ASSESSMENT:   Ventilator,Consult Enteral/tube feeding initiation and management  ASSESSMENT:   60 y.o. male with medical history of HTN and type 2 DM. He presented to the ED on 1/3 following hematochezia at 0345 that AM followed by 3 additional episodes. In the ED, FOBT was positive.  1/11- extubated, OGT removed 1/14- s/p BSE- advanced to carb modified diet 1/18- s/p CABG x5  Diet advanced to regular texture. Appetite progressing after surgery. Last meal completion charted as 50%. Discussed the importance of protein intake to promote post op healing. Pt willing to continue Glucerna. Does not wish to have Ensure MAX as he thinks it makes his BMs loose. Try ProSource for extra protein.   Noted A1C of 5.9%. CBGs well managed today.   Admission weight: 88.2 kg  Current weight: 85.7 kg   UOP: 4065 ml x 24 hrs  Chest tubes: 600 ml since surgery   Drips: transitioning off insulin drip Medications: dulcolax, colace, SS novolog, levemir Labs: CBG 102-167  Diet Order:   Diet Order            Diet heart healthy/carb modified Room service appropriate? Yes; Fluid consistency: Thin  Diet effective now                 EDUCATION NEEDS:   Not appropriate for education at this time  Skin:  Skin Assessment: Skin Integrity Issues: Skin Integrity Issues:: Incisions Incisions: R leg, chest  Last BM:  1/17  Height:   Ht Readings  from Last 1 Encounters:  03/08/20 '5\' 8"'$  (1.727 m)    Weight:   Wt Readings from Last 1 Encounters:  03/09/20 85.7 kg   BMI:  Body mass index is 28.73 kg/m.  Estimated Nutritional Needs:   Kcal:  2050-2250  Protein:  90-105 grams  Fluid:  >/= 2.2 L/day  Mariana Single RD, LDN Clinical Nutrition Pager listed in [place]

## 2020-03-10 ENCOUNTER — Inpatient Hospital Stay (HOSPITAL_COMMUNITY): Payer: Managed Care, Other (non HMO)

## 2020-03-10 LAB — CBC WITH DIFFERENTIAL/PLATELET
Abs Immature Granulocytes: 0.11 10*3/uL — ABNORMAL HIGH (ref 0.00–0.07)
Basophils Absolute: 0 10*3/uL (ref 0.0–0.1)
Basophils Relative: 0 %
Eosinophils Absolute: 0.1 10*3/uL (ref 0.0–0.5)
Eosinophils Relative: 1 %
HCT: 24.8 % — ABNORMAL LOW (ref 39.0–52.0)
Hemoglobin: 7.9 g/dL — ABNORMAL LOW (ref 13.0–17.0)
Immature Granulocytes: 1 %
Lymphocytes Relative: 12 %
Lymphs Abs: 1.3 10*3/uL (ref 0.7–4.0)
MCH: 30.5 pg (ref 26.0–34.0)
MCHC: 31.9 g/dL (ref 30.0–36.0)
MCV: 95.8 fL (ref 80.0–100.0)
Monocytes Absolute: 0.9 10*3/uL (ref 0.1–1.0)
Monocytes Relative: 8 %
Neutro Abs: 8.6 10*3/uL — ABNORMAL HIGH (ref 1.7–7.7)
Neutrophils Relative %: 78 %
Platelets: 168 10*3/uL (ref 150–400)
RBC: 2.59 MIL/uL — ABNORMAL LOW (ref 4.22–5.81)
RDW: 17.5 % — ABNORMAL HIGH (ref 11.5–15.5)
WBC: 11 10*3/uL — ABNORMAL HIGH (ref 4.0–10.5)
nRBC: 0 % (ref 0.0–0.2)

## 2020-03-10 LAB — GLUCOSE, CAPILLARY
Glucose-Capillary: 154 mg/dL — ABNORMAL HIGH (ref 70–99)
Glucose-Capillary: 200 mg/dL — ABNORMAL HIGH (ref 70–99)
Glucose-Capillary: 218 mg/dL — ABNORMAL HIGH (ref 70–99)
Glucose-Capillary: 194 mg/dL — ABNORMAL HIGH (ref 70–99)
Glucose-Capillary: 245 mg/dL — ABNORMAL HIGH (ref 70–99)

## 2020-03-10 LAB — BASIC METABOLIC PANEL
Anion gap: 9 (ref 5–15)
BUN: 7 mg/dL (ref 6–20)
CO2: 22 mmol/L (ref 22–32)
Calcium: 8.4 mg/dL — ABNORMAL LOW (ref 8.9–10.3)
Chloride: 104 mmol/L (ref 98–111)
Creatinine, Ser: 0.64 mg/dL (ref 0.61–1.24)
GFR, Estimated: >60 mL/min (ref >60)
Glucose, Bld: 162 mg/dL — ABNORMAL HIGH (ref 70–99)
Potassium: 4.5 mmol/L (ref 3.5–5.1)
Sodium: 135 mmol/L (ref 135–145)

## 2020-03-10 MED ORDER — FUROSEMIDE 10 MG/ML IJ SOLN
40.0000 mg | Freq: Two times a day (BID) | INTRAMUSCULAR | Status: DC
  Administered 2020-03-10 – 2020-03-12 (×5): 40 mg via INTRAVENOUS
  Filled 2020-03-10 (×5): qty 4

## 2020-03-10 MED ORDER — INSULIN DETEMIR 100 UNIT/ML ~~LOC~~ SOLN
10.0000 [IU] | Freq: Two times a day (BID) | SUBCUTANEOUS | Status: DC
  Administered 2020-03-10 – 2020-03-13 (×6): 10 [IU] via SUBCUTANEOUS
  Filled 2020-03-10 (×7): qty 0.1

## 2020-03-10 MED FILL — Heparin Sodium (Porcine) Inj 1000 Unit/ML: INTRAMUSCULAR | Qty: 20 | Status: AC

## 2020-03-10 MED FILL — Potassium Chloride Inj 2 mEq/ML: INTRAVENOUS | Qty: 40 | Status: AC

## 2020-03-10 MED FILL — Sodium Chloride IV Soln 0.9%: INTRAVENOUS | Qty: 2000 | Status: AC

## 2020-03-10 MED FILL — Magnesium Sulfate Inj 50%: INTRAMUSCULAR | Qty: 10 | Status: AC

## 2020-03-10 MED FILL — Mannitol IV Soln 20%: INTRAVENOUS | Qty: 500 | Status: AC

## 2020-03-10 MED FILL — Heparin Sodium (Porcine) Inj 1000 Unit/ML: INTRAMUSCULAR | Qty: 30 | Status: AC

## 2020-03-10 MED FILL — Electrolyte-R (PH 7.4) Solution: INTRAVENOUS | Qty: 9000 | Status: AC

## 2020-03-10 MED FILL — Calcium Chloride Inj 10%: INTRAVENOUS | Qty: 10 | Status: AC

## 2020-03-10 MED FILL — Albumin, Human Inj 5%: INTRAVENOUS | Qty: 250 | Status: AC

## 2020-03-10 MED FILL — Potassium Chloride Inj 2 mEq/ML: INTRAVENOUS | Qty: 20 | Status: AC

## 2020-03-10 MED FILL — Sodium Bicarbonate IV Soln 8.4%: INTRAVENOUS | Qty: 50 | Status: AC

## 2020-03-10 NOTE — Progress Notes (Signed)
Progress Note  Patient Name: Colton Guzman Date of Encounter: 03/11/2020  [facility] Cardiologist: Freada Bergeron, MD   Subjective   Doing well. Transferred out of ICU. Chest tubes pulled. Using IS. Cr stable 139 HgB stable 7.8 Net negative 2L Wt 185-->183  Inpatient Medications    Scheduled Meds: . (feeding supplement) PROSource Plus  30 mL Oral BID BM  . acetaminophen  1,000 mg Oral Q6H   Or  . acetaminophen (TYLENOL) oral liquid 160 mg/5 mL  1,000 mg Per Tube Q6H  . aspirin EC  325 mg Oral Daily   Or  . aspirin  324 mg Per Tube Daily  . atorvastatin  80 mg Oral Daily  . bisacodyl  10 mg Oral Daily   Or  . bisacodyl  10 mg Rectal Daily  . Chlorhexidine Gluconate Cloth  6 each Topical Daily  . colchicine  0.3 mg Oral BID  . docusate sodium  200 mg Oral Daily  . feeding supplement (GLUCERNA SHAKE)  237 mL Oral BID BM  . furosemide  40 mg Intravenous BID  . insulin aspart  0-9 Units Subcutaneous Q4H  . insulin detemir  10 Units Subcutaneous BID  . isosorbide dinitrate  10 mg Oral TID  . metoprolol tartrate  12.5 mg Oral BID   Or  . metoprolol tartrate  12.5 mg Per Tube BID  . multivitamin with minerals  1 tablet Oral Daily  . mupirocin ointment  1 application Nasal BID  . pantoprazole  40 mg Oral Daily   Continuous Infusions: . lactated ringers     PRN Meds: dextrose, lactated ringers, metoprolol tartrate, morphine injection, ondansetron (ZOFRAN) IV, oxyCODONE, sodium chloride flush, traMADol   Vital Signs    Vitals:   03/11/20 0018 03/11/20 0405 03/11/20 0500 03/11/20 0746  BP: 114/69 123/76  123/75  Pulse: 75 80  93  Resp: '20 16  17  '$ Temp: 98.4 F (36.9 C) 98.4 F (36.9 C)  98.1 F (36.7 C)  TempSrc: Oral Oral  Oral  SpO2: 95% 98%  97%  Weight:   83.4 kg   Height:        Intake/Output Summary (Last 24 hours) at 03/11/2020 0819 Last data filed at 03/11/2020 0747 Gross per 24 hour  Intake 1190 ml  Output 3035 ml  Net -1845 ml   Last  3 Weights 03/11/2020 03/10/2020 03/10/2020  Weight (lbs) 183 lb 13.8 oz 185 lb 13.6 oz 191 lb 14.4 oz  Weight (kg) 83.4 kg 84.3 kg 87.045 kg      Telemetry    NSR - Personally Reviewed  ECG    No new tracing - Personally Reviewed  Physical Exam   GEN: No acute distress.  Sitting in a chair Neck: No JVD Cardiac: RRR, no murmurs, rubs, or gallops. Sternotomy wound vac in place Respiratory: Clear to auscultation bilaterally. GI: Soft, nontender, non-distended  MS: No edema; No deformity. Warm. Left radial wound c/d/i Neuro:  Nonfocal  Psych: Normal affect   Labs    High Sensitivity Troponin:   Recent Labs  Lab 02/24/20 0432 02/24/20 1124 02/24/20 1803 02/24/20 2000 03/01/20 0624  TROPONINIHS 12,216* 15,553* 12,157* 9,517* 942*      Chemistry Recent Labs  Lab 03/06/20 0339 03/07/20 1029 03/08/20 0405 03/08/20 0812 03/09/20 1705 03/10/20 0549 03/11/20 0159  NA 136 133* 135   < > 135 135 137  K 4.4 4.1 3.7   < > 3.6 4.5 3.4*  CL 103 101 101   < >  106 104 104  CO2 26 21* 22   < > 21* 22 25  GLUCOSE 162* 290* 178*   < > 188* 162* 139*  BUN '8 12 12   '$ < > '8 7 7  '$ CREATININE 0.87 0.90 0.96   < > 0.63 0.64 0.65  CALCIUM 8.5* 8.7* 9.0   < > 8.1* 8.4* 8.2*  PROT 6.7 7.6 7.3  --   --   --   --   ALBUMIN 2.6* 3.1* 3.0*  --   --   --   --   AST 47* 63* 49*  --   --   --   --   ALT 72* 80* 70*  --   --   --   --   ALKPHOS 83 95 100  --   --   --   --   BILITOT 0.9 1.2 1.1  --   --   --   --   GFRNONAA >60 >60 >60   < > >60 >60 >60  ANIONGAP '7 11 12   '$ < > '8 9 8   '$ < > = values in this interval not displayed.     Hematology Recent Labs  Lab 03/09/20 1705 03/10/20 0549 03/11/20 0159  WBC 11.2* 11.0* 10.2  RBC 2.70* 2.59* 2.59*  HGB 8.1* 7.9* 7.8*  HCT 26.1* 24.8* 24.9*  MCV 96.7 95.8 96.1  MCH 30.0 30.5 30.1  MCHC 31.0 31.9 31.3  RDW 17.4* 17.5* 17.2*  PLT 151 168 158    BNPNo results for input(s): BNP, PROBNP in the last 168 hours.   DDimer No results for  input(s): DDIMER in the last 168 hours.   Radiology    DG Chest 1 View  Result Date: 03/10/2020 CLINICAL DATA:  Sore chest.  Open-heart surgery. EXAM: CHEST  1 VIEW COMPARISON:  03/09/2020. FINDINGS: Interim removal of Swan-Ganz catheter. Right IJ sheath in stable position. Mediastinal drainage tubes and left chest tube in stable position. Prior median sternotomy. Heart size stable. Low lung volumes with mild bibasilar atelectasis. No prominent pleural effusion. Miniscule left apical pneumothorax cannot be completely excluded on today's exam. IMPRESSION: 1. Interim removal of Swan-Ganz catheter. Right IJ sheath in stable position. Mediastinal drainage tubes and left chest tube in stable position. Miniscule left apical pneumothorax cannot be completely excluded on today's exam. 2.  Low lung volumes with mild bibasilar atelectasis. 3.  Prior median sternotomy.  Heart size stable. Critical Value/emergent results were called by telephone at the time of interpretation on 03/10/2020 at 6:55 am to nurse [name], who verbally acknowledged these results. Electronically Signed   By: Marcello Moores  Register   On: 03/10/2020 06:58    Cardiac Studies   Echocardiogram 02/25/2020: Impressions: 1. LVEF 50-55% with wall motion abnormalities suspicious for mid LAD. No  apical thrombus.  2. Left ventricular ejection fraction, by estimation, is 50 to 55%. The  left ventricle has low normal function. The left ventricle demonstrates  regional wall motion abnormalities (see scoring diagram/findings for  description). Left ventricular diastolic  parameters are consistent with Grade II diastolic dysfunction  (pseudonormalization). Elevated left atrial pressure.  3. Right ventricular systolic function is normal. The right ventricular  size is normal. There is moderately elevated pulmonary artery systolic  pressure. The estimated right ventricular systolic pressure is [id number] mmHg.  4. Left atrial size was mildly dilated.   5. The mitral valve is normal in structure. Moderate mitral valve  regurgitation. No evidence of mitral stenosis.  6. The  aortic valve is normal in structure. Aortic valve regurgitation is  not visualized. Mild to moderate aortic valve sclerosis/calcification is  present, without any evidence of aortic stenosis.  7. The inferior vena cava is normal in size with greater than 50%  respiratory variability, suggesting right atrial pressure of 3 mmHg.    Diagnostic cath 03/04/20 Dominance: Right     Patient Profile     60 y.o. male with a history of hypertension and type 2 diabetes but no known cardiac history who was admitted on 02/22/2020 for further evaluation of hematocheziawith hospital course complicated by chest pain c/b VT arrest requiring shock and CPR x2 minutes. Trop >15,000 with concern for ischemic etiology prompting cardiology consult. Once stabilized from GIB standpoint, patient underwent coronary angiography found to have multivessel CAD nows/p CABG on 03/08/20.  Assessment & Plan    #Multivessel CAD #VT Arrest #NSTEMI: Patient presented with hematochezia with course complicated by chest pain with VT arrest on 01/04 requiring shock and 2-88mn of CPR with return of ROSC. High sensitivity troponin >15,000.EKGwithQ waves in V1-V2 and as well as slight ST depression in V4-V6 and lead II. TTE withLVEF of 50-55% with hypokinesis of mid and distal anterior wall, mid and distal anterior septum, and apex as well as grade 2 diastolic dysfunction. Moderate MR and moderately elevated PASP of 48.7 mmHg. He was stabilized from GIB standpoint with successful extubation on 1/11 and underwent coronary angiography found to have multivessel CAD now s/p CABGon 03/08/20. Doing well post-operatively now transferred out of ICU. -S/p CABGon 1/18withLIMA -> LAD,LRA -> Ramus -> OM,SVG -> PDA,SVG -> PLB of RCA -Doing well post-operatively--extubated night POD#0; off pressors -Chest  tubes removed by CV surgery -Continue ASA and statin -Metop 12.'5mg'$  BID--change to long-acting prior to discharge -Will add GDMT as tolerated post-op -Transferred out of ICU  #Acute Hypoxic Respiratory Failure Post-Cardiac Arrest #Suspected Aspiration due to Cardiac Arrest #Acute pulmonary edema: Improved and successfully diuresed. Was weaned to room air prior to surgery. Was intubated for CABG and successfully extubated on night of surgery. Now back on RA. -Resumed on lasix '40mg'$  IV BID per surgery  #Paroxysmal Afib: Post-arrest rhythm demonstrated Afib with RVR. Newly diagnosed. Holding AC due to recent significant GIB. May need to consider in the future once clinically improved. CHADs-vasc 3 with 4% annual risk of stroke.  -Remains in NSR currently -Holding AC due to recent GIB; may need to reassess going forward given elevated CHADs-vasc -Likely needs long-term monitor to determine burden of Afib to better guide AC managementin future--may have been isolated in the setting of arrest with no recurrence  #HTN: -Metop 12.'5mg'$  BID--change to long-acting prior to discharge  #HLD: -Resumed statin; LFTs improved  #GIB likely Secondary to Diverticular Bleed: - Colonoscopy on 1/8 showed red clotted blood in the entire colon with multiple diverticula throughout. Felt to be from diverticular bleed. - EGD on 1/8 negative for bleeding or ulcer.  - Hemoglobin as low as 7.2 this admission. S/p a total of 7 units of PRBCs (last received 4 units on 1/8) -No recurrent bleeding--continue to monitor      For questions or updates, please contact C[place]ns[place] MillsPlease consult [email or web address] for contact info under        Signed, HFreada Bergeron MD  03/11/2020, 8:19 AM

## 2020-03-10 NOTE — Progress Notes (Signed)
[address]       [place] [address]             [phone]        2 Days Post-Op Procedure(s) (LRB): CORONARY ARTERY BYPASS GRAFTING (CABG) x 5 ON PUMP (N/A) TRANSESOPHAGEAL ECHOCARDIOGRAM (TEE) (N/A) INDOCYANINE GREEN FLUORESCENCE IMAGING (ICG) (N/A) ENDOVEIN HARVEST OF GREATER SAPHENOUS VEIN (Right) RADIAL ARTERY HARVEST (Left) Subjective: Sitting up eating lunch.  No new concerns.   Off all pressors and maintaining adequate O2 sats on RA.   Objective: Vital signs in last 24 hours: Temp:  [98.3 F (36.8 C)-99.6 F (37.6 C)] 98.9 F (37.2 C) (01/20 1111) Pulse Rate:  [71-103] 103 (01/20 1000) Cardiac Rhythm: Normal sinus rhythm (01/20 0800) Resp:  [20-35] 25 (01/20 1000) BP: (114-143)/(65-88) 119/82 (01/20 1000) SpO2:  [92 %-97 %] 96 % (01/20 1000) Arterial Line BP: (93-145)/(35-87) 127/87 (01/20 1000) Weight:  [87 kg] 87 kg (01/20 0500)     Intake/Output from previous day: 01/19 0701 - 01/20 0700 In: 2901 [P.O.:420; I.V.:1481] Out: [id number] [Urine:1055; Chest Tube:280] Intake/Output this shift: Total I/O In: 100 [IV Piggyback:100] Out: 1990 [Urine:1860; Chest Tube:130]  General appearance: alert and cooperative Neurologic: intact Heart: regular rate and rhythm Lungs: breath sounds are clear. CXR noted. No air leak. CT drainage 15m last shift.  Abdomen: soft, non-tender; bowel sounds normal; no masses,  no organomegaly Extremities: extremities normal, atraumatic, no cyanosis or edema Wound: covered with Prevena negative pressure dressing  Lab Results: Recent Labs    03/09/20 1705 03/10/20 0549  WBC 11.2* 11.0*  HGB 8.1* 7.9*  HCT 26.1* 24.8*  PLT 151 168   BMET:  Recent Labs    03/09/20 1705 03/10/20 0549  NA 135 135  K 3.6 4.5  CL 106 104  CO2 21* 22  GLUCOSE 188* 162*  BUN 8 7  CREATININE 0.63 0.64  CALCIUM 8.1* 8.4*    PT/INR:  Recent Labs    03/08/20 1426  LABPROT 17.0*  INR 1.4*   ABG    Component  Value Date/Time   PHART 7.374 03/08/2020 1948   HCO3 23.0 03/08/2020 1948   TCO2 24 03/08/2020 1948   ACIDBASEDEF 2.0 03/08/2020 1948   O2SAT 98.0 03/08/2020 1948   CBG (last 3)  Recent Labs    03/10/20 0343 03/10/20 0739 03/10/20 1110  GLUCAP 154* 200* 218*   CLINICAL DATA:  Sore chest.  Open-heart surgery.  EXAM: CHEST  1 VIEW  COMPARISON:  03/09/2020.  FINDINGS: Interim removal of Swan-Ganz catheter. Right IJ sheath in stable position. Mediastinal drainage tubes and left chest tube in stable position. Prior median sternotomy. Heart size stable. Low lung volumes with mild bibasilar atelectasis. No prominent pleural effusion. Miniscule left apical pneumothorax cannot be completely excluded on today's exam.  IMPRESSION: 1. Interim removal of Swan-Ganz catheter. Right IJ sheath in stable position. Mediastinal drainage tubes and left chest tube in stable position. Miniscule left apical pneumothorax cannot be completely excluded on today's exam.  2.  Low lung volumes with mild bibasilar atelectasis.  3.  Prior median sternotomy.  Heart size stable.  Critical Value/emergent results were called by telephone at the time of interpretation on 03/10/2020 at 6:55 am to nurse CMargreta Journey who verbally acknowledged these results.   Electronically Signed   By: TMarcello Moores Register   On: 03/10/2020 06:58   Assessment/Plan: S/P Procedure(s) (LRB): CORONARY ARTERY BYPASS GRAFTING (CABG) x 5 ON PUMP (N/A) TRANSESOPHAGEAL ECHOCARDIOGRAM (TEE) (N/A) INDOCYANINE GREEN  FLUORESCENCE IMAGING (ICG) (N/A) ENDOVEIN HARVEST OF GREATER SAPHENOUS VEIN (Right) RADIAL ARTERY HARVEST (Left)  -POD2 CABG for MVCAD after presenting with a GI bleed and subsequent VT arrest. Stable cardiac status. D/C arterial line, chest tubes. Continue to advance activity. Transfer to progressive care.  Continue nitrate for radial artery graft patency.   -Type 2 DM- On glyburide and metformin prior to  admission. Glucose 150's-218 past 24 hours. Will adjust Levemir as oral intake improves.  -Hypertension- control reasonable on metoprolol 12.'5mg'$  BID + Isordil TID.  Monitor and titrate metoprolol as needed.   -Expected acute blood loss anemia- No evidence of continued bleeding, no indication for transfusion. Monitor.    LOS: 17 days    Antony Odea . PA-C [phone] 03/10/2020

## 2020-03-10 NOTE — Progress Notes (Signed)
Progress Note  Patient Name: Colton Guzman Date of Encounter: 03/10/2020  [facility] Cardiologist: Freada Bergeron, MD   Subjective   Doing well. Off pressors. PA line pulled. Sitting up in a chair Cr stable 0.64; HgB 7.9 280cc CT output; 1055L UOP; Net positive 1.6L  Inpatient Medications    Scheduled Meds: . (feeding supplement) PROSource Plus  30 mL Oral BID BM  . acetaminophen  1,000 mg Oral Q6H   Or  . acetaminophen (TYLENOL) oral liquid 160 mg/5 mL  1,000 mg Per Tube Q6H  . aspirin EC  325 mg Oral Daily   Or  . aspirin  324 mg Per Tube Daily  . atorvastatin  80 mg Oral Daily  . bisacodyl  10 mg Oral Daily   Or  . bisacodyl  10 mg Rectal Daily  . Chlorhexidine Gluconate Cloth  6 each Topical Daily  . Chlorhexidine Gluconate Cloth  6 each Topical Daily  . colchicine  0.3 mg Oral BID  . diazepam  2 mg Oral Q8H  . docusate sodium  200 mg Oral Daily  . feeding supplement (GLUCERNA SHAKE)  237 mL Oral BID BM  . insulin aspart  0-9 Units Subcutaneous Q4H  . insulin detemir  6 Units Subcutaneous BID  . isosorbide dinitrate  10 mg Oral TID  . mouth rinse  15 mL Mouth Rinse q12n4p  . metoprolol tartrate  12.5 mg Oral BID   Or  . metoprolol tartrate  12.5 mg Per Tube BID  . multivitamin with minerals  1 tablet Oral Daily  . mupirocin ointment  1 application Nasal BID  . pantoprazole  40 mg Oral Daily  . sodium chloride flush  10-40 mL Intracatheter Q12H  . sodium chloride flush  3 mL Intravenous Q12H   Continuous Infusions: . sodium chloride Stopped (03/09/20 1031)  . sodium chloride    . sodium chloride 10 mL/hr at 03/08/20 2200  . lactated ringers    . lactated ringers    . lactated ringers Stopped (03/09/20 1647)  . niCARDipine Stopped (03/08/20 1928)  . nitroGLYCERIN Stopped (03/09/20 1435)  . phenylephrine (NEO-SYNEPHRINE) Adult infusion Stopped (03/09/20 0925)   PRN Meds: sodium chloride, dextrose, lactated ringers, metoprolol tartrate,  midazolam, morphine injection, ondansetron (ZOFRAN) IV, oxyCODONE, sodium chloride flush, sodium chloride flush, traMADol   Vital Signs    Vitals:   03/10/20 0400 03/10/20 0403 03/10/20 0500 03/10/20 0600  BP: 132/85  (!) 143/86 134/88  Pulse: 73     Resp: (!) 23  (!) 29 (!) 27  Temp:  98.3 F (36.8 C)    TempSrc:  Oral    SpO2: 94%     Weight:   87 kg   Height:        Intake/Output Summary (Last 24 hours) at 03/10/2020 0731 Last data filed at 03/10/2020 0700 Gross per 24 hour  Intake 2901.02 ml  Output 1335 ml  Net 1566.02 ml   Last 3 Weights 03/10/2020 03/09/2020 03/07/2020  Weight (lbs) 191 lb 14.4 oz 188 lb 15 oz 178 lb 8 oz  Weight (kg) 87.045 kg 85.7 kg 80.967 kg      Telemetry    NSR with HR 70-80s - Personally Reviewed  ECG    No new tracing - Personally Reviewed  Physical Exam   GEN: No acute distress.   Neck: No JVD Cardiac: RRR, no murmurs, rubs, or gallops. Sternotomy wound vac in place Respiratory: Clear to auscultation bilaterally. CT in place GI: Soft, nontender,  non-distended  MS: No edema; No deformity. Left radial wound dressed Neuro:  Nonfocal  Psych: Normal affect   Labs    High Sensitivity Troponin:   Recent Labs  Lab 02/24/20 0432 02/24/20 1124 02/24/20 1803 02/24/20 2000 03/01/20 0624  TROPONINIHS 12,216* 15,553* 12,157* 9,517* 942*      Chemistry Recent Labs  Lab 03/06/20 0339 03/07/20 1029 03/08/20 0405 03/08/20 0812 03/09/20 0409 03/09/20 1705 03/10/20 0549  NA 136 133* 135   < > 136 135 135  K 4.4 4.1 3.7   < > 3.9 3.6 4.5  CL 103 101 101   < > 108 106 104  CO2 26 21* 22   < > 21* 21* 22  GLUCOSE 162* 290* 178*   < > 110* 188* 162*  BUN '8 12 12   '$ < > '9 8 7  '$ CREATININE 0.87 0.90 0.96   < > 0.66 0.63 0.64  CALCIUM 8.5* 8.7* 9.0   < > 7.7* 8.1* 8.4*  PROT 6.7 7.6 7.3  --   --   --   --   ALBUMIN 2.6* 3.1* 3.0*  --   --   --   --   AST 47* 63* 49*  --   --   --   --   ALT 72* 80* 70*  --   --   --   --   ALKPHOS 83  95 100  --   --   --   --   BILITOT 0.9 1.2 1.1  --   --   --   --   GFRNONAA >60 >60 >60   < > >60 >60 >60  ANIONGAP '7 11 12   '$ < > '7 8 9   '$ < > = values in this interval not displayed.     Hematology Recent Labs  Lab 03/09/20 0409 03/09/20 1705 03/10/20 0549  WBC 9.7 11.2* 11.0*  RBC 2.55* 2.70* 2.59*  HGB 7.8* 8.1* 7.9*  HCT 24.4* 26.1* 24.8*  MCV 95.7 96.7 95.8  MCH 30.6 30.0 30.5  MCHC 32.0 31.0 31.9  RDW 17.1* 17.4* 17.5*  PLT 140* 151 168    BNPNo results for input(s): BNP, PROBNP in the last 168 hours.   DDimer No results for input(s): DDIMER in the last 168 hours.   Radiology    DG Chest 1 View  Result Date: 03/10/2020 CLINICAL DATA:  Sore chest.  Open-heart surgery. EXAM: CHEST  1 VIEW COMPARISON:  03/09/2020. FINDINGS: Interim removal of Swan-Ganz catheter. Right IJ sheath in stable position. Mediastinal drainage tubes and left chest tube in stable position. Prior median sternotomy. Heart size stable. Low lung volumes with mild bibasilar atelectasis. No prominent pleural effusion. Miniscule left apical pneumothorax cannot be completely excluded on today's exam. IMPRESSION: 1. Interim removal of Swan-Ganz catheter. Right IJ sheath in stable position. Mediastinal drainage tubes and left chest tube in stable position. Miniscule left apical pneumothorax cannot be completely excluded on today's exam. 2.  Low lung volumes with mild bibasilar atelectasis. 3.  Prior median sternotomy.  Heart size stable. Critical Value/emergent results were called by telephone at the time of interpretation on 03/10/2020 at 6:55 am to nurse [name], who verbally acknowledged these results. Electronically Signed   By: Marcello Moores  Register   On: 03/10/2020 06:58   DG Chest Port 1 View  Result Date: 03/09/2020 CLINICAL DATA:  Chest tube.  CABG. EXAM: PORTABLE CHEST 1 VIEW COMPARISON:  03/08/2020. FINDINGS: Interim extubation and removal of NG tube. Swan-Ganz  catheter tip noted coiled in the left  pulmonary artery in unchanged position. Mediastinal drainage tube and left chest tube in stable position. Prior median sternotomy. Heart size stable. Low lung volumes with mild left mid and bibasilar subsegmental atelectasis. No pleural effusion or pneumothorax. IMPRESSION: 1. Interim extubation and removal of NG tube. Swan-Ganz catheter tip noted coiled in the left pulmonary artery in unchanged position. Mediastinal drainage tube and left chest tube in stable position. No pneumothorax. 2.  Prior median sternotomy.  Stable cardiomegaly. 3.  Low lung volumes with left mid lung and bibasilar atelectasis. Electronically Signed   By: Marcello Moores  Register   On: 03/09/2020 07:15   DG Chest Port 1 View  Result Date: 03/08/2020 CLINICAL DATA:  Status post coronary bypass graft. EXAM: PORTABLE CHEST 1 VIEW COMPARISON:  March 07, 2020. FINDINGS: Stable cardiomediastinal silhouette. Endotracheal and nasogastric tubes are in good position. Right internal jugular Swan-Ganz catheter is noted with tip directed into expected position of left pulmonary artery. Left-sided chest tube is noted without pneumothorax. Lungs are clear. Bony thorax is unremarkable. IMPRESSION: Endotracheal and nasogastric tubes are in good position. Left-sided chest tube is noted without pneumothorax. Right internal jugular Swan-Ganz catheter tip is directed into expected position of left pulmonary artery. Electronically Signed   By: Marijo Conception M.D.   On: 03/08/2020 15:07    Cardiac Studies   Echocardiogram 02/25/2020: Impressions: 1. LVEF 50-55% with wall motion abnormalities suspicious for mid LAD. No  apical thrombus.  2. Left ventricular ejection fraction, by estimation, is 50 to 55%. The  left ventricle has low normal function. The left ventricle demonstrates  regional wall motion abnormalities (see scoring diagram/findings for  description). Left ventricular diastolic  parameters are consistent with Grade II diastolic dysfunction   (pseudonormalization). Elevated left atrial pressure.  3. Right ventricular systolic function is normal. The right ventricular  size is normal. There is moderately elevated pulmonary artery systolic  pressure. The estimated right ventricular systolic pressure is [id number] mmHg.  4. Left atrial size was mildly dilated.  5. The mitral valve is normal in structure. Moderate mitral valve  regurgitation. No evidence of mitral stenosis.  6. The aortic valve is normal in structure. Aortic valve regurgitation is  not visualized. Mild to moderate aortic valve sclerosis/calcification is  present, without any evidence of aortic stenosis.  7. The inferior vena cava is normal in size with greater than 50%  respiratory variability, suggesting right atrial pressure of 3 mmHg.    Diagnostic cath 03/04/20 Dominance: Right     Patient Profile     59 y.o. male with a history of hypertension and type 2 diabetes but no known cardiac history who was admitted on 02/22/2020 for further evaluation of hematocheziawith hospital course complicated by chest pain c/b VT arrest requiring shock and CPR x2 minutes. Trop >15,000 with concern for ischemic etiology prompting cardiology consult. Once stabilized from GIB standpoint, patient underwent coronary angiography found to have multivessel CAD now s/p CABG on 03/08/20.  Assessment & Plan    #Multivessel CAD #VT Arrest #NSTEMI: Patient presented with hematochezia with course complicated by chest pain with VT arrest on 01/04 requiring shock and 2-46mn of CPR with return of ROSC. High sensitivity troponin >15,000.EKGwithQ waves in V1-V2 and as well as slight ST depression in V4-V6 and lead II. TTE withLVEF of 50-55% with hypokinesis of mid and distal anterior wall, mid and distal anterior septum, and apex as well as grade 2 diastolic dysfunction. Moderate MR and moderately elevated  PASP of 48.7 mmHg. He was stabilized from GIB standpoint with successful  extubation on 1/11 and underwent coronary angiography found to have multivessel CAD now s/p CABG on 03/08/20. Doing well post-operatively -S/p CABG on 1/18 withLIMA -> LAD,LRA -> Ramus -> OM,SVG -> PDA,SVG -> PLB of RCA -Doing well post-operatively--extubated night POD#0; off pressors -Chest tube management per CV surgery -Continue ASA and statin -Metop 12.'5mg'$  BID--change to long-acting prior to discharge -Will add GDMT as tolerated post-op  #Acute Hypoxic Respiratory Failure Post-Cardiac Arrest #Suspected Aspiration due to Cardiac Arrest #Acute pulmonary edema: Improved and successfully diuresed. Was weaned to room air prior to surgery. Was intubated for CABG and successfully extubated on night of surgery. Now back on RA. -Stable; diuresis as needed  #Paroxysmal Afib: Post-arrest rhythm demonstrated Afib with RVR. Newly diagnosed. Holding AC due to recent significant GIB. May need to consider in the future once clinically improved. CHADs-vasc 3 with 4% annual risk of stroke.  -Remains in NSR currently -Holding AC due to recent GIB; may need to reassess going forward given elevated CHADs-vasc -Likely needs long-term monitor to determine burden of Afib to better guide AC management in future--may have been isolated in the setting of arrest with no recurrence  #HTN: -Metop 12.'5mg'$  BID--change to long-acting prior to discharge  #HLD: -Resumed statin; LFTs improved  #GIB likely Secondary to Diverticular Bleed: - Colonoscopy on 1/8 showed red clotted blood in the entire colon with multiple diverticula throughout. Felt to be from diverticular bleed. - EGD on 1/8 negative for bleeding or ulcer.  - Hemoglobin as low as 7.2 this admission. S/p a total of 7 units of PRBCs (last received 4 units on 1/8) -No recurrent bleeding--continue to monitor       For questions or updates, please contact [place] Please consult [email or web address] for contact info under         Signed, Freada Bergeron, MD  03/10/2020, 7:31 AM

## 2020-03-10 NOTE — Hospital Course (Signed)
History of Present Illness:       Colton Guzman is a 60 year old male patient with a past medical history significant for hypertension, hyperlipidemia, type 2 diabetes mellitus, and gout who presented to [facility] on 02/22/2020 for further evaluation of hematochezia thought to be from diverticula throughout the entire colon.  On 02/23/2020 the patient had some chest pain associated with a bloody bowel movement.  His chest pain resolved with nitroglycerin however, he had recurrent chest pain and had a VT arrest.  He received 2 to 5 minutes of CPR and was cardioverted.  Post resuscitation, the patient was in atrial fibrillation.  Cardiology was consulted at this time for further evaluation and management.  His HS Troponin was >15,000. Due to his chest pain and comorbidities it was decided to undergo cardiac catheterization on 03/04/2020.  Cardiac catheterization showed 70% stenosis of the proximal RCA with 50% stenosis of the distal RCA, 70% stenosis of the right PDA, 99% stenosis of the third right PL, 99% stenosis of the second right PL, 90% stenosis of the mid LAD with an ostial LAD stenosis of 70%, 100% stenosis of the ostial to proximal circumflex, and 60% stenosis of the mid LM to ostial LAD.  Due to his severe proximal LAD stenosis and severe mid LAD stenosis coronary bypass grafting was recommended.  We are consulted for possible revascularization.  An echocardiogram was performed on 02/25/2020 which showed an estimated left ventricular ejection fraction of 50 to 55%.  Moderate mitral valve regurgitation with no mitral valve stenosis was found.  There was mild to moderate aortic valve calcification without any evidence of stenosis. He hasn't had any further hematochezia and his H and H is 11.3/35.8.  He remained stable following the left heart cath. CT surgery was consulted for consideration of CABG. Colton Guzman recommended coronary artery bypass grafting and Colton Guzman decided to proceed.   Post-Operative  Hospital Course:

## 2020-03-11 ENCOUNTER — Inpatient Hospital Stay (HOSPITAL_COMMUNITY): Payer: Managed Care, Other (non HMO)

## 2020-03-11 LAB — BASIC METABOLIC PANEL
Anion gap: 8 (ref 5–15)
BUN: 7 mg/dL (ref 6–20)
CO2: 25 mmol/L (ref 22–32)
Calcium: 8.2 mg/dL — ABNORMAL LOW (ref 8.9–10.3)
Chloride: 104 mmol/L (ref 98–111)
Creatinine, Ser: 0.65 mg/dL (ref 0.61–1.24)
GFR, Estimated: >60 mL/min (ref >60)
Glucose, Bld: 139 mg/dL — ABNORMAL HIGH (ref 70–99)
Potassium: 3.4 mmol/L — ABNORMAL LOW (ref 3.5–5.1)
Sodium: 137 mmol/L (ref 135–145)

## 2020-03-11 LAB — BPAM RBC
Blood Product Expiration Date: 202202182359
Blood Product Expiration Date: 202202182359
Blood Product Expiration Date: 202202182359
Blood Product Expiration Date: 202202182359
ISSUE DATE / TIME: 202201181151
ISSUE DATE / TIME: 202201181151
Unit Type and Rh: 5100
Unit Type and Rh: 5100
Unit Type and Rh: 5100
Unit Type and Rh: 5100

## 2020-03-11 LAB — TYPE AND SCREEN
ABO/RH(D): O POS
Antibody Screen: NEGATIVE
Unit division: 0
Unit division: 0
Unit division: 0
Unit division: 0

## 2020-03-11 LAB — CBC
HCT: 24.9 % — ABNORMAL LOW (ref 39.0–52.0)
Hemoglobin: 7.8 g/dL — ABNORMAL LOW (ref 13.0–17.0)
MCH: 30.1 pg (ref 26.0–34.0)
MCHC: 31.3 g/dL (ref 30.0–36.0)
MCV: 96.1 fL (ref 80.0–100.0)
Platelets: 158 10*3/uL (ref 150–400)
RBC: 2.59 MIL/uL — ABNORMAL LOW (ref 4.22–5.81)
RDW: 17.2 % — ABNORMAL HIGH (ref 11.5–15.5)
WBC: 10.2 10*3/uL (ref 4.0–10.5)
nRBC: 0 % (ref 0.0–0.2)

## 2020-03-11 LAB — GLUCOSE, CAPILLARY
Glucose-Capillary: 119 mg/dL — ABNORMAL HIGH (ref 70–99)
Glucose-Capillary: 146 mg/dL — ABNORMAL HIGH (ref 70–99)
Glucose-Capillary: 158 mg/dL — ABNORMAL HIGH (ref 70–99)
Glucose-Capillary: 171 mg/dL — ABNORMAL HIGH (ref 70–99)
Glucose-Capillary: 177 mg/dL — ABNORMAL HIGH (ref 70–99)
Glucose-Capillary: 208 mg/dL — ABNORMAL HIGH (ref 70–99)

## 2020-03-11 MED ORDER — INSULIN ASPART 100 UNIT/ML ~~LOC~~ SOLN
0.0000 [IU] | Freq: Three times a day (TID) | SUBCUTANEOUS | Status: DC
  Administered 2020-03-11: 2 [IU] via SUBCUTANEOUS
  Administered 2020-03-12 (×4): 1 [IU] via SUBCUTANEOUS

## 2020-03-11 MED ORDER — METFORMIN HCL 500 MG PO TABS
500.0000 mg | ORAL_TABLET | Freq: Two times a day (BID) | ORAL | Status: DC
  Administered 2020-03-11 – 2020-03-13 (×4): 500 mg via ORAL
  Filled 2020-03-11 (×4): qty 1

## 2020-03-11 MED ORDER — POTASSIUM CHLORIDE CRYS ER 10 MEQ PO TBCR
20.0000 meq | EXTENDED_RELEASE_TABLET | Freq: Three times a day (TID) | ORAL | Status: DC
  Administered 2020-03-11 – 2020-03-13 (×7): 20 meq via ORAL
  Filled 2020-03-11 (×10): qty 2

## 2020-03-11 NOTE — Progress Notes (Signed)
Progress Note  Patient Name: Colton Guzman Date of Encounter: 03/12/2020  [facility] Cardiologist: Freada Bergeron, MD   Subjective   Feels well. Has been ambulating. Hoping to go home tomorrow.  Inpatient Medications    Scheduled Meds: . (feeding supplement) PROSource Plus  30 mL Oral BID BM  . acetaminophen  1,000 mg Oral Q6H   Or  . acetaminophen (TYLENOL) oral liquid 160 mg/5 mL  1,000 mg Per Tube Q6H  . aspirin EC  325 mg Oral Daily   Or  . aspirin  324 mg Per Tube Daily  . atorvastatin  80 mg Oral Daily  . bisacodyl  10 mg Oral Daily   Or  . bisacodyl  10 mg Rectal Daily  . Chlorhexidine Gluconate Cloth  6 each Topical Daily  . colchicine  0.3 mg Oral BID  . docusate sodium  200 mg Oral Daily  . feeding supplement (GLUCERNA SHAKE)  237 mL Oral BID BM  . furosemide  40 mg Oral Daily  . insulin aspart  0-9 Units Subcutaneous TID AC & HS  . insulin detemir  10 Units Subcutaneous BID  . isosorbide dinitrate  10 mg Oral TID  . metFORMIN  500 mg Oral BID WC  . metoprolol tartrate  12.5 mg Oral BID   Or  . metoprolol tartrate  12.5 mg Per Tube BID  . multivitamin with minerals  1 tablet Oral Daily  . mupirocin ointment  1 application Nasal BID  . pantoprazole  40 mg Oral Daily  . potassium chloride  20 mEq Oral TID   Continuous Infusions: . lactated ringers     PRN Meds: dextrose, lactated ringers, metoprolol tartrate, morphine injection, ondansetron (ZOFRAN) IV, oxyCODONE, sodium chloride flush, traMADol   Vital Signs    Vitals:   03/11/20 2020 03/12/20 0049 03/12/20 0520 03/12/20 0818  BP: 131/69 130/72 124/82 133/87  Pulse: 87 73 72 83  Resp: '16 17 19 18  '$ Temp: 97.9 F (36.6 C) 98.1 F (36.7 C) 98.4 F (36.9 C) 98.5 F (36.9 C)  TempSrc: Oral Oral Oral Oral  SpO2: 97% 98% 98% 100%  Weight:   82.6 kg   Height:        Intake/Output Summary (Last 24 hours) at 03/12/2020 0938 Last data filed at 03/11/2020 2000 Gross per 24 hour  Intake 360  ml  Output 750 ml  Net -390 ml   Last 3 Weights 03/12/2020 03/11/2020 03/10/2020  Weight (lbs) 182 lb 1.6 oz 183 lb 13.8 oz 185 lb 13.6 oz  Weight (kg) 82.6 kg 83.4 kg 84.3 kg      Telemetry    NSR - Personally Reviewed  ECG    No new tracing - Personally Reviewed  Physical Exam   GEN: No acute distress.  Comfortable Neck: No JVD Cardiac: RRR, no murmurs, rubs, or gallops. Sternotomy wound vac in place Respiratory: Clear to auscultation bilaterally. GI: Soft, nontender, non-distended  MS: No edema; No deformity. Neuro:  Nonfocal  Psych: Normal affect   Labs    High Sensitivity Troponin:   Recent Labs  Lab 02/24/20 0432 02/24/20 1124 02/24/20 1803 02/24/20 2000 03/01/20 0624  TROPONINIHS 12,216* 15,553* 12,157* 9,517* 942*      Chemistry Recent Labs  Lab 03/06/20 0339 03/07/20 1029 03/08/20 0405 03/08/20 0812 03/10/20 0549 03/11/20 0159 03/12/20 0213  NA 136 133* 135   < > 135 137 137  K 4.4 4.1 3.7   < > 4.5 3.4* 3.7  CL 103  101 101   < > 104 104 101  CO2 26 21* 22   < > '22 25 26  '$ GLUCOSE 162* 290* 178*   < > 162* 139* 127*  BUN '8 12 12   '$ < > '7 7 9  '$ CREATININE 0.87 0.90 0.96   < > 0.64 0.65 0.69  CALCIUM 8.5* 8.7* 9.0   < > 8.4* 8.2* 8.2*  PROT 6.7 7.6 7.3  --   --   --   --   ALBUMIN 2.6* 3.1* 3.0*  --   --   --   --   AST 47* 63* 49*  --   --   --   --   ALT 72* 80* 70*  --   --   --   --   ALKPHOS 83 95 100  --   --   --   --   BILITOT 0.9 1.2 1.1  --   --   --   --   GFRNONAA >60 >60 >60   < > >60 >60 >60  ANIONGAP '7 11 12   '$ < > '9 8 10   '$ < > = values in this interval not displayed.     Hematology Recent Labs  Lab 03/10/20 0549 03/11/20 0159 03/12/20 0213  WBC 11.0* 10.2 9.0  RBC 2.59* 2.59* 2.67*  HGB 7.9* 7.8* 8.0*  HCT 24.8* 24.9* 25.7*  MCV 95.8 96.1 96.3  MCH 30.5 30.1 30.0  MCHC 31.9 31.3 31.1  RDW 17.5* 17.2* 17.3*  PLT 168 158 190    BNPNo results for input(s): BNP, PROBNP in the last 168 hours.   DDimer No results for  input(s): DDIMER in the last 168 hours.   Radiology    DG Chest Port 1 View  Result Date: 03/11/2020 CLINICAL DATA:  Recent pneumothorax. Recent coronary artery bypass grafting EXAM: PORTABLE CHEST 1 VIEW COMPARISON:  March 10, 2020 FINDINGS: Trace apical pneumothorax on the left is stable. No tension component. There is mild atelectatic change in the medial lung bases. Lungs otherwise are clear. Cordis has been removed since most recent study. Pacemaker wires attached to right heart. Heart is upper normal in size with pulmonary vascularity normal. No adenopathy. No bone lesions. IMPRESSION: Stable trace apical pneumothorax on the left. No tension component. Mild bibasilar atelectasis. Stable cardiac silhouette. Electronically Signed   By: Lowella Grip III M.D.   On: 03/11/2020 08:43    Cardiac Studies   Echocardiogram 02/25/2020: Impressions: 1. LVEF 50-55% with wall motion abnormalities suspicious for mid LAD. No  apical thrombus.  2. Left ventricular ejection fraction, by estimation, is 50 to 55%. The  left ventricle has low normal function. The left ventricle demonstrates  regional wall motion abnormalities (see scoring diagram/findings for  description). Left ventricular diastolic  parameters are consistent with Grade II diastolic dysfunction  (pseudonormalization). Elevated left atrial pressure.  3. Right ventricular systolic function is normal. The right ventricular  size is normal. There is moderately elevated pulmonary artery systolic  pressure. The estimated right ventricular systolic pressure is Q000111Q mmHg.  4. Left atrial size was mildly dilated.  5. The mitral valve is normal in structure. Moderate mitral valve  regurgitation. No evidence of mitral stenosis.  6. The aortic valve is normal in structure. Aortic valve regurgitation is  not visualized. Mild to moderate aortic valve sclerosis/calcification is  present, without any evidence of aortic stenosis.  7. The  inferior vena cava is normal in size with greater than 50%  respiratory variability, suggesting right atrial pressure of 3 mmHg.    Diagnostic cath 03/04/20 Dominance: Right     Patient Profile     60 y.o. male with a history of hypertension and type 2 diabetes but no known cardiac history who was admitted on 02/22/2020 for further evaluation of hematocheziawith hospital course complicated by chest pain c/b VT arrest requiring shock and CPR x2 minutes. Trop >15,000 with concern for ischemic etiology prompting cardiology consult. Once stabilized from GIB standpoint, patient underwent coronary angiography found to have multivessel CAD nows/p CABG on 03/08/20.  Assessment & Plan    #Multivessel CAD #VT Arrest #NSTEMI: Patient presented with hematochezia with course complicated by chest pain with VT arrest on 01/04 requiring shock and 2-35mn of CPR with return of ROSC. High sensitivity troponin >15,000.EKGwithQ waves in V1-V2 and as well as slight ST depression in V4-V6 and lead II. TTE withLVEF of 50-55% with hypokinesis of mid and distal anterior wall, mid and distal anterior septum, and apex as well as grade 2 diastolic dysfunction. Moderate MR and moderately elevated PASP of 48.7 mmHg. He was stabilized from GIB standpoint with successful extubation on 1/11 and underwent coronary angiography found to have multivessel CADnow s/pCABGon 03/08/20. Doing well post-operatively now transferred out of ICU. -S/p CABGon 1/18withLIMA -> LAD,LRA -> Ramus -> OM,SVG -> PDA,SVG -> PLB of RCA -Doing well post-operatively--extubated night POD#0; off pressors -Chest tubes removed by CV surgery -Continue ASA and statin -Metop 12.'5mg'$  BID--change to long-acting prior to discharge -Will add GDMT as tolerated post-op -Transferred out of ICU  #Acute Hypoxic Respiratory Failure Post-Cardiac Arrest #Suspected Aspiration due to Cardiac Arrest #Acute pulmonary edema: Improved and successfully  diuresed. Was weaned toroom airprior to surgery. Was intubated for CABG and successfully extubated on night of surgery. Now back on RA. -Transitioned to lasix '40mg'$  PO daily  #Paroxysmal Afib: Post-arrest rhythm demonstrated Afib with RVR. Newly diagnosed. Holding AC due to recent significant GIB. May need to consider in the future once clinically improved. CHADs-vasc 3 with 4% annual risk of stroke.  -Remains in NSR currently -Holding AC due to recent GIB; may need to reassess going forward given elevated CHADs-vasc -Likely needs long-term monitor to determine burden of Afib to better guide AC managementin future--may have been isolated in the setting of arrest with no recurrence  #HTN: -Metop 12.'5mg'$  BID--change to long-acting prior to discharge  #HLD: -Resumed statin; LFTs improved  #GIB likely Secondary to Diverticular Bleed: - Colonoscopy on 1/8 showed red clotted blood in the entire colon with multiple diverticula throughout. Felt to be from diverticular bleed. - EGD on 1/8 negative for bleeding or ulcer.  - Hemoglobin as low as 7.2 this admission. S/p a total of 7 units of PRBCs (last received 4 units on 1/8) -No recurrent bleeding--continue to monitor      For questions or updates, please contact CL[place]ingPlease consult [email or web address] for contact info under        Signed, HFreada Bergeron MD  03/12/2020, 9:38 AM

## 2020-03-11 NOTE — Progress Notes (Signed)
CARDIAC REHAB PHASE I   PRE:  Rate/Rhythm: 85 SR  BP:  Supine:   Sitting: 132/73  Standing:    SaO2: 96%RA  MODE:  Ambulation: 600 ft   POST:  Rate/Rhythm: 114 ST  BP:  Supine:   Sitting: 111/76  Standing:    SaO2: 98%RA 1018-1045 Pt walked 600 ft on RA with hand held asst and I managed prevena. Pt stopped twice to rest. Tolerated well. Second walk today. Back to recliner with call bell. Reinforced sternal precautions. Coughed up productively after walk.    Graylon Good, RN BSN  03/11/2020 10:41 AM

## 2020-03-11 NOTE — Discharge Summary (Signed)
Physician Discharge Summary  Patient ID: Quamane Dearmon MRN: [id number] DOB/AGE: 21-Oct-1960 60 y.o.  Admit date: 02/22/2020 Discharge date: 03/13/2020  Admission Diagnoses:  Acute lower GI bleed Acute blood loss anemia Acute respiratory failure with hypoxia Type 2 diabetes mellitus History of hypertension Dyslipidemia History of gout   Discharge Diagnoses:  Principal Problem:   Acute lower GI bleeding Active Problems:   Acute blood loss anemia   T2DM (type 2 diabetes mellitus) ([place])   Hypertension   Hyperlipidemia   Gout   Acute respiratory failure with hypoxia (HCC)   Cardiac arrest (HCC)   Shock circulatory (HCC)   Fever   NSTEMI (non-ST elevated myocardial infarction) ([place])   Non-ST elevation (NSTEMI) myocardial infarction (HCC)   S/P CABG x 5   Paroxysmal atrial fibrillation   Discharged Condition: stable  History of Present Illness:      Mr. Omana is a 60 year old male patient with a past medical history significant for hypertension, hyperlipidemia, type 2 diabetes mellitus, and gout who presented to [facility] on 02/22/2020 for further evaluation of hematochezia thought to be from diverticula throughout the entire colon.  On 02/23/2020 the patient had some chest pain associated with a bloody bowel movement.  His chest pain resolved with nitroglycerin however, he had recurrent chest pain and had a VT arrest.  He received 2 to 5 minutes of CPR and was cardioverted.  Post resuscitation, the patient was in atrial fibrillation.  Cardiology was consulted at this time for further evaluation and management.  Due to his chest pain and comorbidities it was decided to undergo cardiac catheterization on 03/04/2020.  Cardiac catheterization showed 70% stenosis of the proximal RCA with 50% stenosis of the distal RCA, 70% stenosis of the right PDA, 99% stenosis of the third right PL, 99% stenosis of the second right PL, 90% stenosis of the mid LAD with an ostial LAD stenosis of 70%,  100% stenosis of the ostial to proximal circumflex, and 60% stenosis of the mid LM to ostial LAD.  Due to his severe proximal LAD stenosis and severe mid LAD stenosis coronary bypass grafting was recommended.  We are consulted for possible revascularization.  An echocardiogram was performed on 02/25/2020 which showed an estimated left ventricular ejection fraction of 50 to 55%.  Moderate mitral valve regurgitation with no mitral valve stenosis was found.  There was mild to moderate aortic valve calcification without any evidence of stenosis. He hasn't had any further hematochezia and his H and H is 11.3/35.8.    Before this admission, he was independent and active. He moved to [place] last May for love and got married to his now wife. Before the move, he was working as a Psychologist, sport and exercise. He has three children by marriage.    Hospital Course:   Mr. Ghobrial remained stable following left heart catheterization.  He decided to proceed with surgery.  He was prepared and taken to the operating room on March 09, 2019 to where CABG x5 was accomplished.  The left internal mammary artery was grafted to the distal left anterior descending coronary artery.  Saphenous vein graft was placed to the posterior descending coronary artery.  A separate saphenous vein graft was placed to the posterior lateral branch of the right coronary artery.   The left radial artery was placed to the ramus intermediate and first obtuse marginal coronary arteries.  Following the procedure, he separated from cardiopulmonary bypass without difficulty.  He was transferred to the ICU in stable condition on nitroglycerin  infusion.  He had some hypertension early postoperatively that was managed with titration of the nitroglycerin as well as addition of nicardipine.  He was weaned from the ventilator and extubated by 6:30 PM on the day of surgery.  By the following morning, he was off of all vasoactive infusions and had stable hemodynamics.   He was mobilized.  His type 2 diabetes was initially managed with an insulin drip and this was later converted to sliding scale insulin with long-acting Levemir.  His glucose was monitored and sliding scale insulin was provided as required through the remainder of the admission.  By the second postoperative day, he was ready for transfer to 4 E. progressive care.  He continued to make excellent progress with mobility.  He was weaned from the supplemental oxygen without difficulty.  Diet was advanced and well-tolerated.  He had return of appropriate bowel bladder function.  Tubes and lines were removed without incident.  His pacer wires were removed on postop day 4.  Routine follow-up chest x-ray showed mild bibasilar ATX and a trace left apical PTX that had remained stable . On POD-5 he was felt to be ready for discharge to home.  By that time, he was ambulating independently and tolerating cardiac diet with no trouble.  His incisions were intact and healing with no sign of complication.  He had good perfusion to all extremities with minimal peripheral edema.    Consults: GI and Advanced heart failure  Significant Diagnostic Studies:     LEFT HEART CATH AND CORONARY ANGIOGRAPHY    Conclusion    Prox RCA lesion is 70% stenosed.  Dist RCA lesion is 50% stenosed.  RPDA lesion is 70% stenosed.  3rd RPL lesion is 99% stenosed.  2nd RPL lesion is 99% stenosed.  Mid LAD lesion is 90% stenosed.  Ost LAD to Prox LAD lesion is 70% stenosed.  Ost Cx to Prox Cx lesion is 100% stenosed.  Mid LM to Ost LAD lesion is 60% stenosed.   1. Severe proximal LAD stenosis. Severe mid LAD stenosis.  2. Complete occlusion of the proximal Circumflex with filling by right to left and left to left collaterals.  3. The RCA is a large dominant vessel with severe proximal stenosis, moderate distal stenosis. Severe stenosis in the posterolateral artery and the PDA.  4. LVEDP 14  Recommendations: He has  severe three vessel CAD. Will keep at [facility] and ask CT surgery to consult for CABG.    Coronary Findings   Diagnostic Dominance: Right  Left Main  Mid LM to Ost LAD lesion is 60% stenosed.  Left Anterior Descending  Vessel is large.  Ost LAD to Prox LAD lesion is 70% stenosed.  Mid LAD lesion is 90% stenosed.  Left Circumflex  Ost Cx to Prox Cx lesion is 100% stenosed. The lesion is chronically occluded.  Second Obtuse Marginal Branch  Collaterals  2nd Mrg filled by collaterals from 2nd Sept.    Third Obtuse Marginal Branch  Collaterals  3rd Mrg filled by collaterals from 3rd RPL.    Right Coronary Artery  Vessel is large.  Prox RCA lesion is 70% stenosed.  Dist RCA lesion is 50% stenosed.  Right Posterior Descending Artery  RPDA lesion is 70% stenosed.  Second Right Posterolateral Branch  2nd RPL lesion is 99% stenosed.  Third Right Posterolateral Branch  3rd RPL lesion is 99% stenosed.   Intervention   No interventions have been documented.  Coronary Diagrams   Diagnostic Dominance: Right  ECHOCARDIOGRAM REPORT       Patient Name:  YIANNIS SERRAO Date of Exam: 02/25/2020  Medical Rec #: [id number]   Height:    68.0 in  Accession #:  [id number]   Weight:    194.4 lb  Date of Birth: 1960/04/04    BSA:     2.019 m  Patient Age:  73 years    BP:      103/61 mmHg  Patient Gender: M       HR:      97 bpm.  Exam Location: Inpatient   Procedure: 2D Echo, Color Doppler, Cardiac Doppler and Intracardiac       Opacification Agent   Indications:  Cardiac arrest I46.9    History:    Patient has no prior history of Echocardiogram  examinations.         Signs/Symptoms:Fever; Risk Factors:Hypertension, Diabetes  and         Dyslipidemia. Acute GI blood loss, presumed  hyporperfusion. ECG         just prior and then post resuscitation with anterior ST          elevations.         A Fib + RVR post resuscitation         Cardiogenic Shock.    Sonographer:  Darlina Sicilian RDCS  Referring Phys: [id number] Donita Brooks     Sonographer Comments: Echo performed with patient supine and on artificial  respirator.  IMPRESSIONS    1. LVEF 50-55% with wall motion abnormalities suspicious for mid LAD. No  apical thrombus.  2. Left ventricular ejection fraction, by estimation, is 50 to 55%. The  left ventricle has low normal function. The left ventricle demonstrates  regional wall motion abnormalities (see scoring diagram/findings for  description). Left ventricular diastolic  parameters are consistent with Grade II diastolic dysfunction  (pseudonormalization). Elevated left atrial pressure.  3. Right ventricular systolic function is normal. The right ventricular  size is normal. There is moderately elevated pulmonary artery systolic  pressure. The estimated right ventricular systolic pressure is Q000111Q mmHg.  4. Left atrial size was mildly dilated.  5. The mitral valve is normal in structure. Moderate mitral valve  regurgitation. No evidence of mitral stenosis.  6. The aortic valve is normal in structure. Aortic valve regurgitation is  not visualized. Mild to moderate aortic valve sclerosis/calcification is  present, without any evidence of aortic stenosis.  7. The inferior vena cava is normal in size with greater than 50%  respiratory variability, suggesting right atrial pressure of 3 mmHg.   FINDINGS  Left Ventricle: Left ventricular ejection fraction, by estimation, is 50  to 55%. The left ventricle has low normal function. The left ventricle  demonstrates regional wall motion abnormalities. Definity contrast agent  was given IV to delineate the left  ventricular endocardial borders. The left ventricular internal cavity size  was normal in size. There is no left ventricular hypertrophy. Left  ventricular diastolic  parameters are consistent with Grade II diastolic  dysfunction (pseudonormalization).  Elevated left atrial pressure.     LV Wall Scoring:  The mid and distal anterior wall, mid and distal anterior septum, and apex  are hypokinetic.   Right Ventricle: The right ventricular size is normal. No increase in  right ventricular wall thickness. Right ventricular systolic function is  normal. There is moderately elevated pulmonary artery systolic pressure.  The tricuspid regurgitant velocity is  3.19 m/s, and with an assumed right atrial pressure of 8  mmHg, the  estimated right ventricular systolic pressure is Q000111Q mmHg.   Left Atrium: Left atrial size was mildly dilated.   Right Atrium: Right atrial size was normal in size.   Pericardium: There is no evidence of pericardial effusion.   Mitral Valve: The mitral valve is normal in structure. Moderate mitral  valve regurgitation. No evidence of mitral valve stenosis.   Tricuspid Valve: The tricuspid valve is normal in structure. Tricuspid  valve regurgitation is mild . No evidence of tricuspid stenosis.   Aortic Valve: The aortic valve is normal in structure. Aortic valve  regurgitation is not visualized. Mild to moderate aortic valve  sclerosis/calcification is present, without any evidence of aortic  stenosis.   Pulmonic Valve: The pulmonic valve was normal in structure. Pulmonic valve  regurgitation is not visualized. No evidence of pulmonic stenosis.   Aorta: The aortic root is normal in size and structure.   Venous: The inferior vena cava is normal in size with greater than 50%  respiratory variability, suggesting right atrial pressure of 3 mmHg.   IAS/Shunts: No atrial level shunt detected by color flow Doppler.     LEFT VENTRICLE  PLAX 2D  LVIDd:     4.80 cm   Diastology  LVIDs:     3.70 cm   LV e' medial:  5.10 cm/s  LV PW:     0.70 cm   LV E/e' medial: 18.1  LV IVS:    1.00 cm   LV e'  lateral:  7.16 cm/s  LVOT diam:   1.80 cm   LV E/e' lateral: 12.9  LV SV:     19  LV SV Index:  9  LVOT Area:   2.54 cm    LV Volumes (MOD)  LV vol d, MOD A2C: 144.0 ml  LV vol d, MOD A4C: 112.0 ml  LV vol s, MOD A2C: 55.8 ml  LV vol s, MOD A4C: 54.3 ml  LV SV MOD A2C:   88.2 ml  LV SV MOD A4C:   112.0 ml  LV SV MOD BP:   75.2 ml   RIGHT VENTRICLE  RV S prime:   7.80 cm/s  TAPSE (M-mode): 1.5 cm   LEFT ATRIUM       Index  LA diam:    4.00 cm 1.98 cm/m  LA Vol (A2C):  37.2 ml 18.42 ml/m  LA Vol (A4C):  28.9 ml 14.31 ml/m  LA Biplane Vol: 35.2 ml 17.43 ml/m  AORTIC VALVE  LVOT Vmax:  61.40 cm/s  LVOT Vmean: 45.800 cm/s  LVOT VTI:  0.075 m    AORTA  Ao Root diam: 2.80 cm   MITRAL VALVE        TRICUSPID VALVE  MV Area (PHT): 0.16 cm  TR Peak grad:  40.7 mmHg  MV Decel Time: 4870 msec  TR Vmax:    319.00 cm/s  MV E velocity: 92.55 cm/s  MV A velocity: 44.10 cm/s SHUNTS  MV E/A ratio: 2.10    Systemic VTI: 0.08 m               Systemic Diam: 1.80 cm   Ena Dawley MD  Electronically signed by Ena Dawley MD  Signature Date/Time: 02/25/2020/1:23:40 PM   Treatments:   CARDIOTHORACIC SURGERY OPERATIVE NOTE  Date of Procedure:    03/08/2020  Preoperative Diagnosis:      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  Postoperative Diagnosis:    Same  Procedure:  Coronary Artery Bypass Grafting x 5             Left Internal Mammary Artery to Distal Left Anterior Descending Coronary Artery, Saphenous Vein Graft to  Posterior Descending Coronary Artery, reverse saphenous vein graft to right posterolateral artery; left radial artery graft to ramus intermedius vessel and first obtuse Marginal Branch of Left Circumflex Coronary Artery Endoscopic Vein Harvest from right thigh and Lower Leg Open left radial artery harvesting Completion graft surveillance with  indocyanine green fluorescence imaging  Surgeon:        B.  Murvin Natal, MD  Assistant:       Macarthur Critchley, PA-C  Anesthesia:    General  Operative Findings: ? Preserved left ventricular systolic function ? Good quality left internal mammary artery conduit ? Good quality saphenous vein conduit; good quality left radial artery ? Fair quality target vessels for grafting    BRIEF CLINICAL NOTE AND INDICATIONS FOR SURGERY  60 year old man was admitted with GI bleeding.  He decompensated from this and sustained a cardiac arrest.  His subsequent work-up included a left heart catheterization demonstrating severe multivessel coronary artery disease.  He was referred for CABG.  The patient has been thoroughly evaluated and is considered a good candidate for the procedure.  He is taken the operating room for multivessel coronary artery bypass  -----------------------------------------------------------------------------------------------------------   [facility] Patient Name: Lawrenc Farrer Procedure Date: 02/27/2020 MRN: [id number] Attending MD: Otis Brace , MD Date of Birth: 1960-04-02 CSN: [id number] Age: 43 Admit Type: Inpatient Procedure:                Colonoscopy Indications:              This is the patient's first colonoscopy, Rectal                            bleeding Providers:                Otis Brace, MD, Kary Kos RN, RN,                            Nelia Shi, RN, Laverda Sorenson, Technician Referring MD:              Medicines:                 Complications:            No immediate complications. Estimated Blood Loss:     Estimated blood loss was minimal. Procedure:                Pre-Anesthesia Assessment:                           - Prior to the procedure, a History and Physical                            was performed, and patient medications and                            allergies were reviewed. The patient's  tolerance of                            previous anesthesia was also reviewed. The  risks                            and benefits of the procedure and the sedation                            options and risks were discussed with the patient.                            All questions were answered, and informed consent                            was obtained. Prior Anticoagulants: The patient has                            taken heparin, last dose was day of procedure. ASA                            Grade Assessment: IV - A patient with severe                            systemic disease that is a constant threat to life.                            After reviewing the risks and benefits, the patient                            was deemed in satisfactory condition to undergo the                            procedure.                           After obtaining informed consent, the colonoscope                            was passed under direct vision. Throughout the                            procedure, the patient's blood pressure, pulse, and                            oxygen saturations were monitored continuously. The                            [id number]) Olympus pediatric colonscope                            was introduced through the anus and advanced to the                            the cecum, identified by appendiceal orifice and  ileocecal valve. The colonoscopy was technically                            difficult and complex due to poor endoscopic                            visualization. Successful completion of the                            procedure was aided by lavage. The patient                            tolerated the procedure well. The quality of the                            bowel preparation was fair. Scope In: 12:29:55 PM Scope Out: 1:04:12 PM Scope Withdrawal Time: 0 hours 15 minutes 2 seconds  Total Procedure Duration: 0 hours 34  minutes 17 seconds  Findings:      Hemorrhoids were found on perianal exam.      Hematin (altered blood/coffee-ground-like material) was found in the       entire colon. Visualization was difficult because of multiple large       blood clots. There was evidence of recent bleeding but I was not able to       identify any active bleeding source despite of thorough lavage. Most       likely diverticular bleed.      Clotted blood was found in the entire colon.      Red blood was found in the entire colon.      Multiple small and large-mouthed diverticula were found in the entire       colon.      Internal hemorrhoids were found during retroflexion. The hemorrhoids       were small. Impression:               - Preparation of the colon was fair.                           - Hemorrhoids found on perianal exam.                           - Blood in the entire examined colon.                           - Blood in the entire examined colon.                           - Blood in the entire examined colon.                           - Diverticulosis in the entire examined colon.                           - Internal hemorrhoids.                           - No specimens collected.  Moderate Sedation:      Moderate (conscious) sedation was ICU Nurse. The following parameters       were monitored: oxygen saturation, heart rate, blood pressure, and       response to care. Recommendation:           - Return patient to ICU for ongoing care.                           - NPO.                           - Continue present medications.                           - Do a GI bleeding (tagged RBC) scan at appointment                            to be scheduled. Procedure Code(s):        --- Professional ---                           [phone], Colonoscopy, flexible; diagnostic, including                            collection of specimen(s) by brushing or washing,                            when performed (separate  procedure) Diagnosis Code(s):        --- Professional ---                           K64.8, Other hemorrhoids                           K92.2, Gastrointestinal hemorrhage, unspecified                           K62.5, Hemorrhage of anus and rectum                           K57.30, Diverticulosis of large intestine without                            perforation or abscess without bleeding CPT copyright 2019 [employer]. All rights reserved. The codes documented in this report are preliminary and upon coder review may  be revised to meet current compliance requirements. Otis Brace, MD Otis Brace, MD 02/27/2020 1:31:28 PM Number of Addenda: 0 Discharge Exam: Blood pressure (!) 141/89, pulse 90, temperature 98.7 F (37.1 C), temperature source Oral, resp. rate 20, height '5\' 8"'$  (1.727 m), weight 81.9 kg, SpO2 96 %.  General appearance: alert and cooperative Neurologic: intact Heart: regular rate and rhythm Lungs: breath sounds are clear. Abdomen: soft, non-tender Extremities: well perfused, minimal peripheral edema.  Wound: sternal incision open to air, clean and dry. The left arm incision is approximated with skin staples and is clean and dry. The RLE EVH incision is approximated and covered with Dermabond.   Disposition:   Discharge Instructions  Amb Referral to Cardiac Rehabilitation   Complete by: As directed    Diagnosis:  CABG NSTEMI     CABG X ___: 5   After initial evaluation and assessments completed: Virtual Based Care may be provided alone or in conjunction with Phase 2 Cardiac Rehab based on patient barriers.: Yes     Allergies as of 03/13/2020   No Known Allergies     Medication List    STOP taking these medications   amLODipine 10 MG tablet Commonly known as: NORVASC   losartan-hydrochlorothiazide 100-25 MG tablet Commonly known as: HYZAAR   metoprolol tartrate 25 MG tablet Commonly known as: LOPRESSOR     TAKE these  medications   allopurinol 100 MG tablet Commonly known as: ZYLOPRIM Take 100 mg by mouth daily.   aspirin 325 MG EC tablet Take 1 tablet (325 mg total) by mouth daily. What changed:   medication strength  how much to take  additional instructions   atorvastatin 80 MG tablet Commonly known as: LIPITOR Take 1 tablet (80 mg total) by mouth daily. What changed:   medication strength  how much to take   colchicine 0.6 MG tablet Take 0.5 tablets (0.3 mg total) by mouth 2 (two) times daily.   furosemide 40 MG tablet Commonly known as: LASIX Take 0.5 tablets (20 mg total) by mouth daily.   glyBURIDE 5 MG tablet Commonly known as: DIABETA Take 5 mg by mouth 2 (two) times daily with a meal.   isosorbide dinitrate 10 MG tablet Commonly known as: ISORDIL Take 1 tablet (10 mg total) by mouth 3 (three) times daily.   Magnesium 300 MG Caps Take 300 mg by mouth daily.   metFORMIN 1000 MG tablet Commonly known as: GLUCOPHAGE Take 1,000 mg by mouth 2 (two) times daily with a meal.   metoprolol succinate 25 MG 24 hr tablet Commonly known as: Toprol XL Take 1 tablet (25 mg total) by mouth daily.   multivitamin with minerals Tabs tablet Take 1 tablet by mouth daily.   potassium chloride SA 20 MEQ tablet Commonly known as: KLOR-CON Take 0.5 tablets (10 mEq total) by mouth daily.   traMADol 50 MG tablet Commonly known as: ULTRAM Take 1 tablet (50 mg total) by mouth every 4 (four) hours as needed for up to 5 days for moderate pain.   vitamin C 1000 MG tablet Take 1,000 mg by mouth daily.   Vitamin D (Ergocalciferol) 1.25 MG (50000 UNIT) Caps capsule Commonly known as: DRISDOL Take 50,000 Units by mouth every 7 (seven) days.       Follow-up Information    Wonda Olds, MD. Go on 03/28/2020.   Specialty: Cardiothoracic Surgery Why: Your appointment with Dr. Julien Girt is on Monday, 03/28/2020 at 3:30 PM.  Please arrive 30 minutes early for a chest x-ray to be performed by  [facility] located on the 1st floor of the same building Contact information: [address] [place] [address] [id number]        Freada Bergeron, MD. Go on 03/28/2020.   Specialties: Cardiology, Radiology Why: Your cardiology follow-up appointment is on Monday, 03/28/2020 at 8:40 AM Contact information: [id number] [address] [place] [address] [phone]              The patient has been discharged on:   1.Beta Blocker:  Yes [  y ]  No   [   ]                              If No, reason:  2.Ace Inhibitor/ARB: Yes [   ]                                     No  [ x  ]                                     If No, reason:  BP too soft  3.Statin:   Yes [x   ]                  No  [   ]                  If No, reason:  4.Ecasa:  Yes  [ x ]                  No   [   ]                  If No, reason:     Signed: Antony Odea, PA-C 03/13/2020, 9:36 AM

## 2020-03-11 NOTE — Progress Notes (Addendum)
[address]       [place] [address]             [phone]        2 Days Post-Op Procedure(s) (LRB): CORONARY ARTERY BYPASS GRAFTING (CABG) x 5 ON PUMP (N/A) TRANSESOPHAGEAL ECHOCARDIOGRAM (TEE) (N/A) INDOCYANINE GREEN FLUORESCENCE IMAGING (ICG) (N/A) ENDOVEIN HARVEST OF GREATER SAPHENOUS VEIN (Right) RADIAL ARTERY HARVEST (Left) Subjective: Sitting up in the bedside chair. Walked in the hall this morning before breakfast.  He is pleased with his progress and has no new concerns.   Remains on RA.  Objective: Vital signs in last 24 hours: Temp:  [98.1 F (36.7 C)-98.9 F (37.2 C)] 98.1 F (36.7 C) (01/21 0746) Pulse Rate:  [75-112] 93 (01/21 0746) Cardiac Rhythm: Normal sinus rhythm (01/21 0826) Resp:  [16-40] 17 (01/21 0746) BP: (113-160)/(69-86) 123/75 (01/21 0746) SpO2:  [94 %-98 %] 97 % (01/21 0746) Arterial Line BP: (127-146)/(66-87) 146/71 (01/20 1100) Weight:  [83.4 kg-84.3 kg] 83.4 kg (01/21 0500)     Intake/Output from previous day: 01/20 0701 - 01/21 0700 In: 1050 [P.O.:950; IV Piggyback:100] Out: [id number] [Urine:2845; Chest Tube:170] Intake/Output this shift: Total I/O In: 240 [P.O.:240] Out: -   General appearance: alert and cooperative Neurologic: intact Heart: regular rate and rhythm Lungs: breath sounds are clear.CXR stable, tiny left apical PTX unchanged.  Abdomen: soft, non-tender; bowel sounds normal; no masses,  no organomegaly Extremities: well perfused, minimal peripheral edema.  Wound: sternal incision is covered with a Prevena negative pressure dressing.  The left arm incision is approximated with skin staples and is clean and dry.   Lab Results: Recent Labs    03/10/20 0549 03/11/20 0159  WBC 11.0* 10.2  HGB 7.9* 7.8*  HCT 24.8* 24.9*  PLT 168 158   BMET:  Recent Labs    03/10/20 0549 03/11/20 0159  NA 135 137  K 4.5 3.4*  CL 104 104  CO2 22 25  GLUCOSE 162* 139*  BUN 7 7  CREATININE 0.64 0.65   CALCIUM 8.4* 8.2*    PT/INR:  Recent Labs    03/08/20 1426  LABPROT 17.0*  INR 1.4*   ABG    Component Value Date/Time   PHART 7.374 03/08/2020 1948   HCO3 23.0 03/08/2020 1948   TCO2 24 03/08/2020 1948   ACIDBASEDEF 2.0 03/08/2020 1948   O2SAT 98.0 03/08/2020 1948   CBG (last 3)  Recent Labs    03/11/20 0015 03/11/20 0403 03/11/20 0748  GLUCAP 158* 119* 208*     Assessment/Plan: S/P Procedure(s) (LRB): CORONARY ARTERY BYPASS GRAFTING (CABG) x 5 ON PUMP (N/A) TRANSESOPHAGEAL ECHOCARDIOGRAM (TEE) (N/A) INDOCYANINE GREEN FLUORESCENCE IMAGING (ICG) (N/A) ENDOVEIN HARVEST OF GREATER SAPHENOUS VEIN (Right) RADIAL ARTERY HARVEST (Left)  -POD3 CABG for MVCAD after presenting with a GI bleed and subsequent VT arrest. Stable cardiac status on ASA, atorvastatin, metoprolol.  Continue nitrate for radial artery graft patency. Leave the Prevena dressing in place for another 24 hours.   -Type 2 DM- On glyburide and metformin prior to admission. Glucose 150's-218 past 24 hours. Will resume his metformin at reduced dose and continue Levemir / SSI.  -Hypertension- control reasonable on metoprolol 12.'5mg'$  BID + Isordil TID.  Monitor and titrate metoprolol as needed.   -Expected acute blood loss anemia- No evidence of continued bleeding, no indication for transfusion. Monitor.  -Hypokalemia- replace.   -Disposition- anticipate discharge to home this weekend.     LOS: 18 days  Colton Guzman . PA-C [id number] 03/11/2020 Agree with documentation; plan discharge over the weekend barring unforseen circumstances. Man Effertz Z. Orvan Seen, [place]

## 2020-03-11 NOTE — Progress Notes (Signed)
Mobility Specialist: Progress Note   03/11/20 1434  Mobility  Activity Ambulated in hall  Level of Assistance Independent  Assistive Device None  Distance Ambulated (ft) 830 ft  Mobility Response Tolerated well  Mobility performed by Mobility specialist  Bed Position Chair  $Mobility charge 1 Mobility   Pre-Mobility: 88 HR Post-Mobility: 103 HR, 129/80 BP, 99% SpO2  Pt asx during ambulation. Pt back to chair after walk. After returning to the chair pt's monitor displayed asystole, pt asx. All leads were on. RN notified.   [facility] Fate Caster Mobility Specialist

## 2020-03-12 LAB — CBC
HCT: 25.7 % — ABNORMAL LOW (ref 39.0–52.0)
Hemoglobin: 8 g/dL — ABNORMAL LOW (ref 13.0–17.0)
MCH: 30 pg (ref 26.0–34.0)
MCHC: 31.1 g/dL (ref 30.0–36.0)
MCV: 96.3 fL (ref 80.0–100.0)
Platelets: 190 10*3/uL (ref 150–400)
RBC: 2.67 MIL/uL — ABNORMAL LOW (ref 4.22–5.81)
RDW: 17.3 % — ABNORMAL HIGH (ref 11.5–15.5)
WBC: 9 10*3/uL (ref 4.0–10.5)
nRBC: 0 % (ref 0.0–0.2)

## 2020-03-12 LAB — BASIC METABOLIC PANEL
Anion gap: 10 (ref 5–15)
BUN: 9 mg/dL (ref 6–20)
CO2: 26 mmol/L (ref 22–32)
Calcium: 8.2 mg/dL — ABNORMAL LOW (ref 8.9–10.3)
Chloride: 101 mmol/L (ref 98–111)
Creatinine, Ser: 0.69 mg/dL (ref 0.61–1.24)
GFR, Estimated: >60 mL/min (ref >60)
Glucose, Bld: 127 mg/dL — ABNORMAL HIGH (ref 70–99)
Potassium: 3.7 mmol/L (ref 3.5–5.1)
Sodium: 137 mmol/L (ref 135–145)

## 2020-03-12 LAB — GLUCOSE, CAPILLARY
Glucose-Capillary: 129 mg/dL — ABNORMAL HIGH (ref 70–99)
Glucose-Capillary: 140 mg/dL — ABNORMAL HIGH (ref 70–99)
Glucose-Capillary: 139 mg/dL — ABNORMAL HIGH (ref 70–99)
Glucose-Capillary: 140 mg/dL — ABNORMAL HIGH (ref 70–99)

## 2020-03-12 MED ORDER — FUROSEMIDE 40 MG PO TABS
40.0000 mg | ORAL_TABLET | Freq: Every day | ORAL | Status: DC
  Administered 2020-03-13: 40 mg via ORAL
  Filled 2020-03-12: qty 1

## 2020-03-12 NOTE — Progress Notes (Signed)
EPW removed, prevena removed as well.  Pt tolerated well, VSS.

## 2020-03-12 NOTE — Progress Notes (Addendum)
[address]       [place] [address]             [phone]        2 Days Post-Op Procedure(s) (LRB): CORONARY ARTERY BYPASS GRAFTING (CABG) x 5 ON PUMP (N/A) TRANSESOPHAGEAL ECHOCARDIOGRAM (TEE) (N/A) INDOCYANINE GREEN FLUORESCENCE IMAGING (ICG) (N/A) ENDOVEIN HARVEST OF GREATER SAPHENOUS VEIN (Right) RADIAL ARTERY HARVEST (Left) Subjective: Sitting up in the bedside chair. No new complamins or concerns.  He is pleased with his progress and is independent with ambulation.   Remains on RA.  Objective: Vital signs in last 24 hours: Temp:  [97.9 F (36.6 C)-98.5 F (36.9 C)] 98.5 F (36.9 C) (01/22 0818) Pulse Rate:  [72-97] 83 (01/22 0818) Cardiac Rhythm: Normal sinus rhythm (01/22 0755) Resp:  [16-20] 18 (01/22 0818) BP: (112-133)/(69-87) 133/87 (01/22 0818) SpO2:  [96 %-100 %] 100 % (01/22 0818) Weight:  [82.6 kg] 82.6 kg (01/22 0520)    Intake/Output from previous day: 01/21 0701 - 01/22 0700 In: 600 [P.O.:600] Out: 1225 [Urine:1225] Intake/Output this shift: No intake/output data recorded.  General appearance: alert and cooperative Neurologic: intact Heart: regular rate and rhythm Lungs: breath sounds are clear. Abdomen: soft, non-tender Extremities: well perfused, minimal peripheral edema.  Wound: sternal incision is covered with a Prevena negative pressure dressing.  The left arm incision is approximated with skin staples and is clean and dry.   Lab Results: Recent Labs    03/11/20 0159 03/12/20 0213  WBC 10.2 9.0  HGB 7.8* 8.0*  HCT 24.9* 25.7*  PLT 158 190   BMET:  Recent Labs    03/11/20 0159 03/12/20 0213  NA 137 137  K 3.4* 3.7  CL 104 101  CO2 25 26  GLUCOSE 139* 127*  BUN 7 9  CREATININE 0.65 0.69  CALCIUM 8.2* 8.2*    PT/INR:  No results for input(s): LABPROT, INR in the last 72 hours. ABG    Component Value Date/Time   PHART 7.374 03/08/2020 1948   HCO3 23.0 03/08/2020 1948   TCO2 24 03/08/2020  1948   ACIDBASEDEF 2.0 03/08/2020 1948   O2SAT 98.0 03/08/2020 1948   CBG (last 3)  Recent Labs    03/11/20 1652 03/11/20 2220 03/12/20 0632  GLUCAP 146* 177* 129*     Assessment/Plan: S/P Procedure(s) (LRB): CORONARY ARTERY BYPASS GRAFTING (CABG) x 5 ON PUMP (N/A) TRANSESOPHAGEAL ECHOCARDIOGRAM (TEE) (N/A) INDOCYANINE GREEN FLUORESCENCE IMAGING (ICG) (N/A) ENDOVEIN HARVEST OF GREATER SAPHENOUS VEIN (Right) RADIAL ARTERY HARVEST (Left)  -POD4 CABG for MVCAD after presenting with a GI bleed and subsequent VT arrest. Stable cardiac status on ASA, atorvastatin, metoprolol.  Continue nitrate for radial artery graft patency.Discontinue the Prevena dressing today, resume standard wound care. Wt is several Kg below pre-op so will stop the BID IV lasix and begin Lasix '40mg'$  po daily. D/C the pacer wires.   -Type 2 DM- On glyburide and metformin prior to admission. Glucose control improved at 129-177 past 24 hours. Will continue his metformin at reduced dose and continue Levemir / SSI.  -Hypertension- control reasonable on metoprolol 12.'5mg'$  BID + Isordil TID.  No change in dosing today.   -Expected acute blood loss anemia- No evidence of continued bleeding, no indication for transfusion. Monitor.  -Hypokalemia- replace. Recheck in am.   -Disposition- anticipate discharge to home tomorrow.   LOS: 19 days  Antony Odea . PA-C [id number] 03/12/2020 Patient seen and examined, agree with above Making good progress  Laurielle Selmon C. Tiras Bianchini, MD [facility] [phone]  

## 2020-03-12 NOTE — Progress Notes (Signed)
[id number]. Patient on bedrest. Educated patient and wife on sternal precautions, Exercise instructions, end points of exercise.  Patient is interested in virtual cardiac rehab worried about increase in COVID 19 cases. Will place referral for [facility]. Discussed heart healthy diabetic and restrictions  with the patient and his wife. Reinforced the need to use incentive spirometer during the day at home upon discharge. Set up discharge open heart surgery video for the patient and his wife. Patient's wife requesting a shower chair and CBG meter will notify patient's RN.Barnet Pall, RN,BSN 03/12/2020 10:49 AM

## 2020-03-12 NOTE — Progress Notes (Signed)
Progress Note  Patient Name: Colton Guzman Date of Encounter: 03/13/2020  [facility] Cardiologist: Freada Bergeron, MD   Subjective   Feeling well. So excited to go home today.  Inpatient Medications    Scheduled Meds: . (feeding supplement) PROSource Plus  30 mL Oral BID BM  . acetaminophen  1,000 mg Oral Q6H   Or  . acetaminophen (TYLENOL) oral liquid 160 mg/5 mL  1,000 mg Per Tube Q6H  . aspirin EC  325 mg Oral Daily   Or  . aspirin  324 mg Per Tube Daily  . atorvastatin  80 mg Oral Daily  . bisacodyl  10 mg Oral Daily   Or  . bisacodyl  10 mg Rectal Daily  . Chlorhexidine Gluconate Cloth  6 each Topical Daily  . colchicine  0.3 mg Oral BID  . docusate sodium  200 mg Oral Daily  . feeding supplement (GLUCERNA SHAKE)  237 mL Oral BID BM  . furosemide  40 mg Oral Daily  . insulin aspart  0-9 Units Subcutaneous TID AC & HS  . insulin detemir  10 Units Subcutaneous BID  . isosorbide dinitrate  10 mg Oral TID  . metFORMIN  500 mg Oral BID WC  . metoprolol tartrate  12.5 mg Oral BID   Or  . metoprolol tartrate  12.5 mg Per Tube BID  . multivitamin with minerals  1 tablet Oral Daily  . mupirocin ointment  1 application Nasal BID  . pantoprazole  40 mg Oral Daily  . potassium chloride  20 mEq Oral TID   Continuous Infusions: . lactated ringers     PRN Meds: dextrose, lactated ringers, metoprolol tartrate, morphine injection, ondansetron (ZOFRAN) IV, oxyCODONE, sodium chloride flush, traMADol   Vital Signs    Vitals:   03/13/20 0001 03/13/20 0421 03/13/20 0500 03/13/20 0914  BP: 120/79 140/85  (!) 141/89  Pulse: 75 71  90  Resp: '17 17  20  '$ Temp: 98.6 F (37 C) 98.5 F (36.9 C)  98.7 F (37.1 C)  TempSrc: Oral Oral  Oral  SpO2: 98% 96%  96%  Weight:   81.9 kg   Height:        Intake/Output Summary (Last 24 hours) at 03/13/2020 1018 Last data filed at 03/13/2020 0914 Gross per 24 hour  Intake 250 ml  Output 975 ml  Net -725 ml   Last 3 Weights  03/13/2020 03/12/2020 03/11/2020  Weight (lbs) 180 lb 9.6 oz 182 lb 1.6 oz 183 lb 13.8 oz  Weight (kg) 81.92 kg 82.6 kg 83.4 kg      Telemetry    NSR - Personally Reviewed  ECG    No new tracing - Personally Reviewed  Physical Exam   GEN: No acute distress.   Neck: No JVD Cardiac: RRR, no murmurs, rubs, or gallops. Sternotomy wound vac in place Respiratory: Clear to auscultation bilaterally. GI: Soft, nontender, non-distended  MS: No edema; No deformity. Left radial wound c/d/i.  Neuro:  Nonfocal  Psych: Normal affect   Labs    High Sensitivity Troponin:   Recent Labs  Lab 02/24/20 0432 02/24/20 1124 02/24/20 1803 02/24/20 2000 03/01/20 0624  TROPONINIHS 12,216* 15,553* 12,157* 9,517* 942*      Chemistry Recent Labs  Lab 03/07/20 1029 03/08/20 0405 03/08/20 0812 03/11/20 0159 03/12/20 0213 03/13/20 0220  NA 133* 135   < > 137 137 137  K 4.1 3.7   < > 3.4* 3.7 4.1  CL 101 101   < >  104 101 103  CO2 21* 22   < > '25 26 24  '$ GLUCOSE 290* 178*   < > 139* 127* 110*  BUN 12 12   < > '7 9 12  '$ CREATININE 0.90 0.96   < > 0.65 0.69 0.80  CALCIUM 8.7* 9.0   < > 8.2* 8.2* 8.8*  PROT 7.6 7.3  --   --   --   --   ALBUMIN 3.1* 3.0*  --   --   --   --   AST 63* 49*  --   --   --   --   ALT 80* 70*  --   --   --   --   ALKPHOS 95 100  --   --   --   --   BILITOT 1.2 1.1  --   --   --   --   GFRNONAA >60 >60   < > >60 >60 >60  ANIONGAP 11 12   < > '8 10 10   '$ < > = values in this interval not displayed.     Hematology Recent Labs  Lab 03/10/20 0549 03/11/20 0159 03/12/20 0213  WBC 11.0* 10.2 9.0  RBC 2.59* 2.59* 2.67*  HGB 7.9* 7.8* 8.0*  HCT 24.8* 24.9* 25.7*  MCV 95.8 96.1 96.3  MCH 30.5 30.1 30.0  MCHC 31.9 31.3 31.1  RDW 17.5* 17.2* 17.3*  PLT 168 158 190    BNPNo results for input(s): BNP, PROBNP in the last 168 hours.   DDimer No results for input(s): DDIMER in the last 168 hours.   Radiology    No results found.  Cardiac Studies    Echocardiogram 02/25/2020: Impressions: 1. LVEF 50-55% with wall motion abnormalities suspicious for mid LAD. No  apical thrombus.  2. Left ventricular ejection fraction, by estimation, is 50 to 55%. The  left ventricle has low normal function. The left ventricle demonstrates  regional wall motion abnormalities (see scoring diagram/findings for  description). Left ventricular diastolic  parameters are consistent with Grade II diastolic dysfunction  (pseudonormalization). Elevated left atrial pressure.  3. Right ventricular systolic function is normal. The right ventricular  size is normal. There is moderately elevated pulmonary artery systolic  pressure. The estimated right ventricular systolic pressure is [id number] mmHg.  4. Left atrial size was mildly dilated.  5. The mitral valve is normal in structure. Moderate mitral valve  regurgitation. No evidence of mitral stenosis.  6. The aortic valve is normal in structure. Aortic valve regurgitation is  not visualized. Mild to moderate aortic valve sclerosis/calcification is  present, without any evidence of aortic stenosis.  7. The inferior vena cava is normal in size with greater than 50%  respiratory variability, suggesting right atrial pressure of 3 mmHg.    Diagnostic cath 03/04/20 Dominance: Right     Patient Profile     60 y.o. male with a history of hypertension and type 2 diabetes but no known cardiac history who was admitted on 02/22/2020 for further evaluation of hematocheziawith hospital course complicated by chest pain c/b VT arrest requiring shock and CPR x2 minutes. Trop >15,000 with concern for ischemic etiology prompting cardiology consult. Once stabilized from GIB standpoint, patient underwent coronary angiography found to have multivessel CAD nows/p CABG on 03/08/20.  Assessment & Plan    #Multivessel CAD #VT Arrest #NSTEMI: Patient presented with hematochezia with course complicated by chest pain with VT  arrest on 01/04 requiring shock and 2-35mn of CPR with return of  ROSC. High sensitivity troponin >15,000.EKGwithQ waves in V1-V2 and as well as slight ST depression in V4-V6 and lead II. TTE withLVEF of 50-55% with hypokinesis of mid and distal anterior wall, mid and distal anterior septum, and apex as well as grade 2 diastolic dysfunction. Moderate MR and moderately elevated PASP of 48.7 mmHg. He was stabilized from GIB standpoint with successful extubation on 1/11 and underwent coronary angiography found to have multivessel CADnow s/pCABGon 03/08/20. Doing well post-operativelynow transferred out of ICU. -S/p CABGon 1/18withLIMA -> LAD,LRA -> Ramus -> OM,SVG -> PDA,SVG -> PLB of RCA -Doing well post-operatively--extubated night POD#0; off pressors -Chest tubes removed by CV surgery -Continue ASA and statin -Will change metop to '25mg'$  XL today on discharge -Add losartan '25mg'$  daily as out-patient as able -Likely home today per CV surgery   #Acute Hypoxic Respiratory Failure Post-Cardiac Arrest #Suspected Aspiration due to Cardiac Arrest #Acute pulmonary edema: Improved and successfully diuresed. Was weaned toroom airprior to surgery. Was intubated for CABG and successfully extubated on night of surgery. Now back on RA. -Transitioned to lasix '40mg'$  PO daily  #Paroxysmal Afib: Post-arrest rhythm demonstrated Afib with RVR. Newly diagnosed. Holding AC due to recent significant GIB. May need to consider in the future once clinically improved. CHADs-vasc 3 with 4% annual risk of stroke.  -Remains in NSR currently -Holding AC due to recent GIB; may need to reassess going forward given elevated CHADs-vasc -Likely needs long-term monitor to determine burden of Afib to better guide AC managementin future--may have been isolated in the setting of arrest with no recurrence--can follow-up as out-patient  #HTN: -Change metop to '25mg'$  XL today on discharge -Plan to add low dose losartan  '25mg'$  daily as out-patient  #HLD: -Resumed statin; LFTs improved  #GIB likely Secondary to Diverticular Bleed: - Colonoscopy on 1/8 showed red clotted blood in the entire colon with multiple diverticula throughout. Felt to be from diverticular bleed. - EGD on 1/8 negative for bleeding or ulcer.  - Hemoglobin as low as 7.2 this admission. S/p a total of 7 units of PRBCs (last received 4 units on 1/8) -No recurrent bleeding--continue to monitor      For questions or updates, please contact [place] Please consult [email or web address] for contact info under        Signed, Freada Bergeron, MD  03/13/2020, 10:18 AM

## 2020-03-12 NOTE — Progress Notes (Signed)
Mobility Specialist - Progress Note   03/12/20 1125  Mobility  Activity Ambulated in hall  Level of Assistance Independent  Assistive Device None  Distance Ambulated (ft) 740 ft  Mobility Response Tolerated well  Mobility performed by Mobility specialist  $Mobility charge 1 Mobility   Pre-mobility: 95 HR During mobility: 112 HR Post-mobility: 85 HR  No assist for bed mobility or standing w/ sternal precautions. Asx throughout ambulation. Pt back in bed after walk.  Pricilla Handler Mobility Specialist Mobility Specialist Phone: [phone]

## 2020-03-13 LAB — BASIC METABOLIC PANEL
Anion gap: 10 (ref 5–15)
BUN: 12 mg/dL (ref 6–20)
CO2: 24 mmol/L (ref 22–32)
Calcium: 8.8 mg/dL — ABNORMAL LOW (ref 8.9–10.3)
Chloride: 103 mmol/L (ref 98–111)
Creatinine, Ser: 0.8 mg/dL (ref 0.61–1.24)
GFR, Estimated: >60 mL/min (ref >60)
Glucose, Bld: 110 mg/dL — ABNORMAL HIGH (ref 70–99)
Potassium: 4.1 mmol/L (ref 3.5–5.1)
Sodium: 137 mmol/L (ref 135–145)

## 2020-03-13 LAB — GLUCOSE, CAPILLARY: Glucose-Capillary: 108 mg/dL — ABNORMAL HIGH (ref 70–99)

## 2020-03-13 MED ORDER — METOPROLOL SUCCINATE ER 25 MG PO TB24: 25.0000 mg | ORAL_TABLET | Freq: Every day | ORAL | 11 refills | Status: DC

## 2020-03-13 MED ORDER — ASPIRIN 325 MG PO TBEC: 325.0000 mg | DELAYED_RELEASE_TABLET | Freq: Every day | ORAL | 0 refills | Status: DC

## 2020-03-13 MED ORDER — COLCHICINE 0.6 MG PO TABS: 0.3000 mg | ORAL_TABLET | Freq: Two times a day (BID) | ORAL | 1 refills | Status: DC

## 2020-03-13 MED ORDER — FUROSEMIDE 40 MG PO TABS: 20.0000 mg | ORAL_TABLET | Freq: Every day | ORAL | 1 refills | Status: DC

## 2020-03-13 MED ORDER — TRAMADOL HCL 50 MG PO TABS: 50.0000 mg | ORAL_TABLET | ORAL | 0 refills | Status: DC | PRN

## 2020-03-13 MED ORDER — ISOSORBIDE DINITRATE 10 MG PO TABS: 10.0000 mg | ORAL_TABLET | Freq: Three times a day (TID) | ORAL | 1 refills | Status: DC

## 2020-03-13 MED ORDER — ATORVASTATIN CALCIUM 80 MG PO TABS: 80.0000 mg | ORAL_TABLET | Freq: Every day | ORAL | 2 refills | Status: DC

## 2020-03-13 MED ORDER — POTASSIUM CHLORIDE CRYS ER 20 MEQ PO TBCR: 10.0000 meq | EXTENDED_RELEASE_TABLET | Freq: Every day | ORAL | 1 refills | Status: DC

## 2020-03-13 NOTE — TOC Transition Note (Signed)
Transition of Care (TOC) - CM/SW Discharge Note Marvetta Gibbons RN, BSN Transitions of Care Unit 4E- RN Case Manager See Treatment Team for direct phone #    Patient Details  Name: Colton Guzman MRN: [id number] Date of Birth: [birth year]  Transition of Care [facility]) CM/SW Contact:  Dawayne Patricia, RN Phone Number: 03/13/2020, 1:14 PM   Clinical Narrative:    Pt stable for transition home today, notified by unit that pt waiting on DME- no orders noted for home DME- orders were placed incorrectly on 1/22- orders placed correctly for home DME- RW and 3n1 per verbal order from [place]. PA.  Call made to [employer] line for home DME needs- RW and 3n1 to be delivered to room prior to discharge 1230 notified by bedside RN- wife does not want to wait any longer for DME and would like to return tomorrow to pick up- unit will hold DME until wife comes tomorrow to pick up.   Final next level of care: Home/Self Care Barriers to Discharge: Equipment Delay   Patient Goals and CMS Choice Patient states their goals for this hospitalization and ongoing recovery are:: return home   Choice offered to / list presented to : Spouse  Discharge Placement               Home        Discharge Plan and Services   Discharge Planning Services: CM Consult Post Acute Care Choice: Durable Medical Equipment          DME Arranged: 3-N-1,Walker rolling DME Agency: [employer] Date DME Agency Contacted: 03/13/20 Time DME Agency Contacted: 1200 Representative spoke with at DME Agency: [place]: NA [place] Agency: NA        Social Determinants of Health (SDOH) Interventions     Readmission Risk Interventions No flowsheet data found.

## 2020-03-13 NOTE — Progress Notes (Signed)
Plan of care reviewed. Pt's been progressing. He's hemodynamically stable. Remained afebrile. Pain tolerated, rested well at night. Tramadol and tylenol given. Sternal incision and left arm and right leg incision dry and clean, no drainage.  Painted with betadine. No acute distress noted. Ambulated in hallway 470 feet. Well tolerated.  We will continue to monitor.  Kennyth Lose, RN

## 2020-03-13 NOTE — Progress Notes (Addendum)
      [address]       [place] [address]             [phone]        2 Days Post-Op Procedure(s) (LRB): CORONARY ARTERY BYPASS GRAFTING (CABG) x 5 ON PUMP (N/A) TRANSESOPHAGEAL ECHOCARDIOGRAM (TEE) (N/A) INDOCYANINE GREEN FLUORESCENCE IMAGING (ICG) (N/A) ENDOVEIN HARVEST OF GREATER SAPHENOUS VEIN (Right) RADIAL ARTERY HARVEST (Left) Subjective: Sitting up in the bedside chair. No new complamins or concerns.  He feels like he is ready to return home.    Objective: Vital signs in last 24 hours: Temp:  [97.6 F (36.4 C)-98.7 F (37.1 C)] 98.7 F (37.1 C) (01/23 0914) Pulse Rate:  [71-90] 90 (01/23 0914) Cardiac Rhythm: Sinus tachycardia (01/23 0840) Resp:  [17-20] 20 (01/23 0914) BP: (120-144)/(76-89) 141/89 (01/23 0914) SpO2:  [96 %-100 %] 96 % (01/23 0914) Weight:  [81.9 kg] 81.9 kg (01/23 0500)    Intake/Output from previous day: 01/22 0701 - 01/23 0700 In: 250 [P.O.:250] Out: 1675 [Urine:1675] Intake/Output this shift: Total I/O In: -  Out: 100 [Urine:100]  General appearance: alert and cooperative Neurologic: intact Heart: regular rate and rhythm Lungs: breath sounds are clear. Abdomen: soft, non-tender Extremities: well perfused, minimal peripheral edema.  Wound: sternal incision open to air, clean and dry. The left arm incision is approximated with skin staples and is clean and dry. The RLE EVH incision is approximated and covered with Dermabond.   Lab Results: Recent Labs    03/11/20 0159 03/12/20 0213  WBC 10.2 9.0  HGB 7.8* 8.0*  HCT 24.9* 25.7*  PLT 158 190   BMET:  Recent Labs    03/12/20 0213 03/13/20 0220  NA 137 137  K 3.7 4.1  CL 101 103  CO2 26 24  GLUCOSE 127* 110*  BUN 9 12  CREATININE 0.69 0.80  CALCIUM 8.2* 8.8*    PT/INR:  No results for input(s): LABPROT, INR in the last 72 hours. ABG    Component Value Date/Time   PHART 7.374 03/08/2020 1948   HCO3 23.0 03/08/2020 1948   TCO2 24 03/08/2020  1948   ACIDBASEDEF 2.0 03/08/2020 1948   O2SAT 98.0 03/08/2020 1948   CBG (last 3)  Recent Labs    03/12/20 1638 03/12/20 2145 03/13/20 0645  GLUCAP 139* 140* 108*     Assessment/Plan: S/P Procedure(s) (LRB): CORONARY ARTERY BYPASS GRAFTING (CABG) x 5 ON PUMP (N/A) TRANSESOPHAGEAL ECHOCARDIOGRAM (TEE) (N/A) INDOCYANINE GREEN FLUORESCENCE IMAGING (ICG) (N/A) ENDOVEIN HARVEST OF GREATER SAPHENOUS VEIN (Right) RADIAL ARTERY HARVEST (Left)  -POD5 CABG for MVCAD after presenting with a GI bleed and subsequent VT arrest. Stable cardiac status on ASA, atorvastatin, metoprolol.  Continue nitrate for radial artery graft patency.   -Type 2 DM- On glyburide and metformin prior to admission. Glucose control improved past 24 hours. Will resume his metformin at full dose and also resume his glyburide at discharge.   -Hypertension- control reasonable on metoprolol 12.'5mg'$  BID + Isordil TID.  Change to Toprol '25mg'$  daily at discharge per cardiology recs.  -Expected acute blood loss anemia- No evidence of continued bleeding, no indication for transfusion.  -Hypokalemia- replaced, . -Disposition-  discharge to home today. Instructions given.    LOS: 20 days  Antony Odea . PA-C [id number] 03/13/2020 Patient seen and examined, agree with above [facility] for [place] home  [place]. Roxan Hockey, MD [facility] [phone]

## 2020-03-19 ENCOUNTER — Other Ambulatory Visit: Payer: Self-pay | Admitting: Physician Assistant

## 2020-03-21 ENCOUNTER — Other Ambulatory Visit: Payer: Self-pay | Admitting: Physician Assistant

## 2020-03-21 MED ORDER — TRAMADOL HCL 50 MG PO TABS: 50.0000 mg | ORAL_TABLET | Freq: Four times a day (QID) | ORAL | 0 refills | Status: AC | PRN

## 2020-03-25 ENCOUNTER — Other Ambulatory Visit: Payer: Self-pay | Admitting: Cardiothoracic Surgery

## 2020-03-25 DIAGNOSIS — Z951 Presence of aortocoronary bypass graft: Secondary | ICD-10-CM

## 2020-03-27 NOTE — Progress Notes (Signed)
Cardiology Office Note:    Date:  03/28/2020   ID:  Colton Guzman., DOB [birth year], MRN [id number]  PCP:  London Pepper, MD  [facility] Cardiologist:  Freada Bergeron, MD  [facility] Electrophysiologist:  None   Referring MD: London Pepper, MD    History of Present Illness:    Colton Guzman. is a 60 y.o. male with a hx of DMII, HLD and HTN and recent prolonged admission at [facility] hospital where he presented with hematochezia with course complicated by chest pain/VT arrest requiring CPR later found to have multivessel CAD s/p CABG on 03/08/20. He now presents to clinic for follow-up from his recent hospitalization.  Patient was hospitalized at [facility] from 02/22/20-03/13/20 where he presented with hematochezia from diverticular source. On 02/23/20, the patient developed chest pain in the setting of a blood bowel movement that initially responded to SL NTG but then recurred with subsequent VT arrest requiring 2-67mn CPR and shock. Post-shock rhythm Afib. [place]rop [place] at [id number]. He was stabilized from GIB standpoint and underwent coronary angiography on 03/04/20 which showed 70% stenosis of the proximal RCA with 50% stenosis of the distal RCA, 70% stenosis of the right PDA, 99% stenosis of the third right PL, 99% stenosis of the second right PL, 90% stenosis of the mid LAD with an ostial LAD stenosis of 70%, 100% stenosis of the ostial to proximal circumflex, and 60% stenosis of the mid LM to ostial LAD.  Due to his severe proximal LAD stenosis and severe mid LAD stenosis coronary bypass grafting was recommended. He underwent successful CABG x5 on 03/08/20 with LIMA to distal LAD, SVG to PDA., SVG tp posterior lateral branch of the right coronary artery, left radial artery to the ramus intermediate and first obtuse marginal coronary arteries.  Following the procedure, he separated from cardiopulmonary bypass without difficulty. He did well and was successfully discharged home on  03/13/20.  He now returns to clinic for follow-up. He states that he is doing well at home. Energy is improving and he is walking in front of his house several times a day. No chest pain, SOB, lightheadedness, swelling, nausea or vomiting. Has occasional palpitations which usually occur after he walks outside. Blood pressure is elevated today in the office and has been 120-150s at home. Blood sugars well controlled. Has not yet started cardiac rehab. No further blood in his stool or melena.  Past Medical History:  Diagnosis Date  . Diabetes mellitus without complication ([place]   . Hypertension     Past Surgical History:  Procedure Laterality Date  . COLONOSCOPY WITH PROPOFOL Left 02/27/2020   Procedure: COLONOSCOPY WITH PROPOFOL;  Surgeon: BOtis Brace MD;  Location: [facility];  Service: Gastroenterology;  Laterality: Left;  . CORONARY ARTERY BYPASS GRAFT N/A 03/08/2020   Procedure: CORONARY ARTERY BYPASS GRAFTING (CABG) x 5 ON PUMP;  Surgeon: AWonda Olds MD;  [place]  Service: Open Heart Surgery;  Laterality: N/A;  . ENDOVEIN HARVEST OF GREATER SAPHENOUS VEIN Right 03/08/2020   Procedure: ENDOVEIN HARVEST OF GREATER SAPHENOUS VEIN;  Surgeon: AWonda Olds MD;  [place]  Service: Open Heart Surgery;  Laterality: Right;  . ESOPHAGOGASTRODUODENOSCOPY (EGD) WITH PROPOFOL N/A 02/27/2020   Procedure: ESOPHAGOGASTRODUODENOSCOPY (EGD) WITH PROPOFOL;  Surgeon: BOtis Brace MD;  Location: [facility];  Service: Gastroenterology;  Laterality: N/A;  . LEFT HEART CATH AND CORONARY ANGIOGRAPHY N/A 03/04/2020   Procedure: LEFT HEART CATH AND CORONARY ANGIOGRAPHY;  Surgeon: MBurnell Blanks  MD;  Location: [place] CV [facility];  Service: Cardiovascular;  Laterality: N/A;  . RADIAL ARTERY HARVEST Left 03/08/2020   Procedure: RADIAL ARTERY HARVEST;  Surgeon: Wonda Olds, MD;  Location: [place];  Service: Open Heart Surgery;  Laterality: Left;  . TEE WITHOUT  CARDIOVERSION N/A 03/08/2020   Procedure: TRANSESOPHAGEAL ECHOCARDIOGRAM (TEE);  Surgeon: Wonda Olds, MD;  Location: [place];  Service: Open Heart Surgery;  Laterality: N/A;    Current Medications: Current Meds  Medication Sig  . allopurinol (ZYLOPRIM) 100 MG tablet Take 100 mg by mouth daily.  . Ascorbic Acid (VITAMIN C) 1000 MG tablet Take 1,000 mg by mouth daily.  Marland Kitchen aspirin EC 325 MG EC tablet Take 1 tablet (325 mg total) by mouth daily.  Marland Kitchen atorvastatin (LIPITOR) 80 MG tablet Take 1 tablet (80 mg total) by mouth daily.  . colchicine 0.6 MG tablet Take 0.5 tablets (0.3 mg total) by mouth 2 (two) times daily.  . furosemide (LASIX) 40 MG tablet Take 0.5 tablets (20 mg total) by mouth daily.  Marland Kitchen glyBURIDE (DIABETA) 5 MG tablet Take 5 mg by mouth 2 (two) times daily with a meal.  . isosorbide dinitrate (ISORDIL) 10 MG tablet Take 1 tablet (10 mg total) by mouth 3 (three) times daily.  Marland Kitchen losartan (COZAAR) 25 MG tablet Take 1 tablet (25 mg total) by mouth daily.  . Magnesium 300 MG CAPS Take 300 mg by mouth daily.  . metFORMIN (GLUCOPHAGE) 1000 MG tablet Take 1,000 mg by mouth 2 (two) times daily with a meal.  . metoprolol succinate (TOPROL XL) 25 MG 24 hr tablet Take 1 tablet (25 mg total) by mouth daily.  . Multiple Vitamin (MULTIVITAMIN WITH MINERALS) TABS tablet Take 1 tablet by mouth daily.  . potassium chloride (KLOR-CON) 20 MEQ tablet Take 0.5 tablets (10 mEq total) by mouth daily.  . traMADol (ULTRAM) 50 MG tablet TAKE 1 TABLET BY MOUTH EVERY 4 (FOUR) HOURS AS NEEDED FOR UP TO 5 DAYS FOR MODERATE PAIN.  Marland Kitchen Vitamin D, Ergocalciferol, (DRISDOL) 1.25 MG (50000 UNIT) CAPS capsule Take 50,000 Units by mouth every 7 (seven) days.     Allergies:   Patient has no known allergies.   Social History   Socioeconomic History  . Marital status: Married    Spouse name: Not on file  . Number of children: Not on file  . Years of education: Not on file  . Highest education level: Not on file   Occupational History  . Not on file  Tobacco Use  . Smoking status: Never Smoker  . Smokeless tobacco: Never Used  Substance and Sexual Activity  . Alcohol use: Yes  . Drug use: Not on file  . Sexual activity: Not on file  Other Topics Concern  . Not on file  Social History Narrative  . Not on file   Social Determinants of Health   Financial Resource Strain: Not on file  Food Insecurity: Not on file  Transportation Needs: Not on file  Physical Activity: Not on file  Stress: Not on file  Social Connections: Not on file     Family History: The patient's family history is not on file.  ROS:   Please see the history of present illness.    Review of Systems  Constitutional: Negative for chills and fever.  HENT: Negative for congestion.   Eyes: Negative for blurred vision.  Respiratory: Positive for shortness of breath.   Cardiovascular: Positive for palpitations. Negative for chest pain, orthopnea,  claudication, leg swelling and PND.  Gastrointestinal: Negative for blood in stool, melena, nausea and vomiting.  Genitourinary: Negative for hematuria.  Musculoskeletal: Negative for falls.  Neurological: Negative for dizziness and loss of consciousness.  Endo/Heme/Allergies: Negative for polydipsia.  Psychiatric/Behavioral: Negative for substance abuse.    EKGs/Labs/Other Studies Reviewed:    The following studies were reviewed today: Echocardiogram 02/25/2020: Impressions: 1. LVEF 50-55% with wall motion abnormalities suspicious for mid LAD. No  apical thrombus.  2. Left ventricular ejection fraction, by estimation, is 50 to 55%. The  left ventricle has low normal function. The left ventricle demonstrates  regional wall motion abnormalities (see scoring diagram/findings for  description). Left ventricular diastolic  parameters are consistent with Grade II diastolic dysfunction  (pseudonormalization). Elevated left atrial pressure.  3. Right ventricular systolic  function is normal. The right ventricular  size is normal. There is moderately elevated pulmonary artery systolic  pressure. The estimated right ventricular systolic pressure is [id number] mmHg.  4. Left atrial size was mildly dilated.  5. The mitral valve is normal in structure. Moderate mitral valve  regurgitation. No evidence of mitral stenosis.  6. The aortic valve is normal in structure. Aortic valve regurgitation is  not visualized. Mild to moderate aortic valve sclerosis/calcification is  present, without any evidence of aortic stenosis.  7. The inferior vena cava is normal in size with greater than 50%  respiratory variability, suggesting right atrial pressure of 3 mmHg.    Diagnostic cath 03/04/20 Dominance: Right      Recent Labs: 02/23/2020: TSH 1.033 03/08/2020: ALT 70 03/09/2020: Magnesium 2.1 03/12/2020: Hemoglobin 8.0; Platelets 190 03/13/2020: BUN 12; Creatinine, Ser 0.80; Potassium 4.1; Sodium 137  Recent Lipid Panel No results found for: CHOL, TRIG, HDL, CHOLHDL, VLDL, LDLCALC, LDLDIRECT   Physical Exam:    VS:  BP (!) 144/80   Pulse 100   Ht '5\' 8"'$  (1.727 m)   Wt 178 lb 9.6 oz (81 kg)   SpO2 97%   BMI 27.16 kg/m     Wt Readings from Last 3 Encounters:  03/28/20 178 lb 9.6 oz (81 kg)  03/13/20 180 lb 9.6 oz (81.9 kg)     GEN:  Well nourished, well developed in no acute distress HEENT: Normal NECK: No JVD; No carotid bruits LYMPHATICS: No lymphadenopathy CARDIAC: RRR, no murmurs, rubs, gallops. Sternotomy site c/d/i.  RESPIRATORY:  Clear to auscultation without rales, wheezing or rhonchi  ABDOMEN: Soft, non-tender, non-distended MUSCULOSKELETAL:  No edema; No deformity. Left radial artery grafting site with staples in place and c/d/i SKIN: Warm and dry NEUROLOGIC:  Alert and oriented x 3 PSYCHIATRIC:  Normal affect   ASSESSMENT:    1. Coronary artery disease involving native coronary artery of native heart without angina pectoris   2. Non-ST  elevation (NSTEMI) myocardial infarction ([place])   3. H/O five vessel coronary artery bypass   4. Paroxysmal atrial fibrillation (HCC)   5. Primary hypertension   6. Mixed hyperlipidemia   7. Type 2 diabetes mellitus with other circulatory complication, without long-term current use of insulin (HCC)    PLAN:    In order of problems listed above:  #Multivessel CAD #VT Arrest #NSTEMI: Patient presented with hematochezia with course complicated by chest pain with VT arrest on 01/04 requiring shock and 2-75mn of CPR with return of ROSC. High sensitivity troponin >15,000.EKGwithQ waves in V1-V2 and as well as slight ST depression in V4-V6 and lead II. TTE withLVEF of 50-55% with hypokinesis of mid and distal anterior wall,  mid and distal anterior septum, and apex as well as grade 2 diastolic dysfunction. Moderate MR and moderately elevated PASP of 48.7 mmHg. He was stabilized from GIB standpoint with successful extubation on 1/11 and underwent coronary angiography found to have multivessel CADnow s/pCABGon 03/08/20. Doing well post-operativelynow transferred out of ICU. -S/p CABGon 1/18withLIMA -> LAD,LRA -> Ramus -> OM,SVG -> PDA,SVG -> PLB of RCA -Continue ASA and statin -Will discuss with CV surgery about need for plavix given NSTEMI -Continue metop to '25mg'$  XL  -Add losartan '25mg'$  daily; check BMET next week -Follow-up with CV surgery as scheduled -Needs cardiac rehab once cleared by surgery  #Paroxysmal Afib: Post-arrest rhythm demonstrated Afib with RVR. Newly diagnosed.  AC held as inpatient due to recent significant GIB. CHADs-vasc 3 with 4% annual risk of stroke. No recurrence throughout hospitalization -Remains in NSR currently -Will place 30 day event monitor to assess further as patient would merit [facility] is has Afib at homet  #HTN: -Continue metop to '25mg'$  XL  -Add losartan '25mg'$  today -Goal Bps <120s/80s  #HLD: -Continue atorvastatin '80mg'$  daily  #GIB likely  Secondary to Diverticular Bleed: Colonoscopy on 1/8 showed red clotted blood in the entire colon with multiple diverticula throughout. Felt to be from diverticular bleed. No further bleeding.  - EGD on 1/8 negative for bleeding or ulcer.  - S/p a total of 7 units of PRBCs (last received 4 units on 1/8) -No recurrent bleeding--continue to monitor  #DMII:  Blood glucose much better controlled at home. -Continue metformin -Consider adding SGLT-2 inhibitor at next visit     Medication Adjustments/Labs and Tests Ordered: Current medicines are reviewed at length with the patient today.  Concerns regarding medicines are outlined above.  Orders Placed This Encounter  Procedures  . Basic metabolic panel  . Cardiac event monitor   Meds ordered this encounter  Medications  . losartan (COZAAR) 25 MG tablet    Sig: Take 1 tablet (25 mg total) by mouth daily.    Dispense:  90 tablet    Refill:  3    Patient Instructions  Medication Instructions:  Your physician has recommended you make the following change in your medication:  1.) start losartan 25 mg - take one tablet daily  *If you need a refill on your cardiac medications before your next appointment, please call your pharmacy*   Lab Work: In about 10 days - please return for lab work Artist)  If you have labs (blood work) drawn today and your tests are completely normal, you will receive your results only by: Marland Kitchen MyChart Message (if you have MyChart) OR . A paper copy in the mail If you have any lab test that is abnormal or we need to change your treatment, we will call you to review the results.   Testing/Procedures: Your physician has recommended that you wear an event monitor. Event monitors are medical devices that record the heart's electrical activity. Doctors most often [place] these monitors to diagnose arrhythmias. Arrhythmias are problems with the speed or rhythm of the heartbeat. The monitor is a small, portable device. You  can wear one while you do your normal daily activities. This is usually used to diagnose what is causing palpitations/syncope (passing out).   Follow-Up: At [facility], you and your health needs are our priority.  As part of our continuing mission to provide you with exceptional heart care, we have created designated Provider Care Teams.  These Care Teams include your primary Cardiologist (physician) and Advanced Practice  Providers (APPs -  Physician Assistants and Nurse Practitioners) who all work together to provide you with the care you need, when you need it.   Your next appointment:   6 week(s)  The format for your next appointment:   In Person  Provider:   [name], MD   Other Instructions      Signed, Freada Bergeron, MD  03/28/2020 9:09 AM    [place]

## 2020-03-28 ENCOUNTER — Ambulatory Visit (INDEPENDENT_AMBULATORY_CARE_PROVIDER_SITE_OTHER): Payer: Managed Care, Other (non HMO)

## 2020-03-28 ENCOUNTER — Other Ambulatory Visit: Payer: Self-pay

## 2020-03-28 ENCOUNTER — Ambulatory Visit (INDEPENDENT_AMBULATORY_CARE_PROVIDER_SITE_OTHER): Payer: Self-pay | Admitting: Cardiothoracic Surgery

## 2020-03-28 ENCOUNTER — Ambulatory Visit (INDEPENDENT_AMBULATORY_CARE_PROVIDER_SITE_OTHER): Payer: Managed Care, Other (non HMO) | Admitting: Cardiology

## 2020-03-28 ENCOUNTER — Encounter: Payer: Self-pay | Admitting: Cardiology

## 2020-03-28 ENCOUNTER — Ambulatory Visit
Admission: RE | Admit: 2020-03-28 | Discharge: 2020-03-28 | Disposition: A | Discharge status: Home / Self Care | Payer: Managed Care, Other (non HMO) | Source: Ambulatory Visit | Attending: Cardiothoracic Surgery | Admitting: Cardiothoracic Surgery

## 2020-03-28 VITALS — BP 146/88 | HR 98 | Temp 97.2°F | Resp 20 | Ht 68.0 in | Wt 179.2 lb

## 2020-03-28 VITALS — BP 144/80 | HR 100 | Ht 68.0 in | Wt 178.6 lb

## 2020-03-28 DIAGNOSIS — I10 Essential (primary) hypertension: Secondary | ICD-10-CM

## 2020-03-28 DIAGNOSIS — E782 Mixed hyperlipidemia: Secondary | ICD-10-CM

## 2020-03-28 DIAGNOSIS — E1159 Type 2 diabetes mellitus with other circulatory complications: Secondary | ICD-10-CM

## 2020-03-28 DIAGNOSIS — I2510 Atherosclerotic heart disease of native coronary artery without angina pectoris: Secondary | ICD-10-CM

## 2020-03-28 DIAGNOSIS — I214 Non-ST elevation (NSTEMI) myocardial infarction: Secondary | ICD-10-CM | POA: Diagnosis not present

## 2020-03-28 DIAGNOSIS — I480 Paroxysmal atrial fibrillation: Secondary | ICD-10-CM

## 2020-03-28 DIAGNOSIS — Z951 Presence of aortocoronary bypass graft: Secondary | ICD-10-CM | POA: Diagnosis not present

## 2020-03-28 MED ORDER — ASPIRIN EC 81 MG PO TBEC: 81.0000 mg | DELAYED_RELEASE_TABLET | Freq: Every day | ORAL | 2 refills | Status: DC

## 2020-03-28 MED ORDER — CLOPIDOGREL BISULFATE 75 MG PO TABS: 75.0000 mg | ORAL_TABLET | Freq: Every day | ORAL | 11 refills | Status: DC

## 2020-03-28 MED ORDER — LOSARTAN POTASSIUM 25 MG PO TABS: 25.0000 mg | ORAL_TABLET | Freq: Every day | ORAL | 3 refills | Status: DC

## 2020-03-28 MED ORDER — TRAMADOL HCL 50 MG PO TABS: 50.0000 mg | ORAL_TABLET | Freq: Four times a day (QID) | ORAL | 0 refills | Status: DC | PRN

## 2020-03-28 NOTE — Patient Instructions (Signed)
Medication Instructions:  Your physician has recommended you make the following change in your medication:  1.) start losartan 25 mg - take one tablet daily  *If you need a refill on your cardiac medications before your next appointment, please call your pharmacy*   Lab Work: In about 10 days - please return for lab work Artist)  If you have labs (blood work) drawn today and your tests are completely normal, you will receive your results only by: Marland Kitchen MyChart Message (if you have MyChart) OR . A paper copy in the mail If you have any lab test that is abnormal or we need to change your treatment, we will call you to review the results.   Testing/Procedures: Your physician has recommended that you wear an event monitor. Event monitors are medical devices that record the heart's electrical activity. Doctors most often [place] these monitors to diagnose arrhythmias. Arrhythmias are problems with the speed or rhythm of the heartbeat. The monitor is a small, portable device. You can wear one while you do your normal daily activities. This is usually used to diagnose what is causing palpitations/syncope (passing out).   Follow-Up: At [facility], you and your health needs are our priority.  As part of our continuing mission to provide you with exceptional heart care, we have created designated Provider Care Teams.  These Care Teams include your primary Cardiologist (physician) and Advanced Practice Providers (APPs -  Physician Assistants and Nurse Practitioners) who all work together to provide you with the care you need, when you need it.   Your next appointment:   6 week(s)  The format for your next appointment:   In Person  Provider:   [name], MD   Other Instructions

## 2020-04-01 ENCOUNTER — Telehealth: Payer: Self-pay

## 2020-04-01 ENCOUNTER — Telehealth: Payer: Self-pay | Admitting: Cardiology

## 2020-04-01 NOTE — Telephone Encounter (Signed)
-----   Message from Freada Bergeron, MD sent at 04/01/2020  3:29 PM EST ----- Can we do aspirin '81mg'$  and apixaban '5mg'$  BID for him and drop the plavix? Thanks so much Pam!  ----- Message ----- From: Wonda Olds, MD Sent: 04/01/2020   3:25 PM EST To: Freada Bergeron, MD  Hi. ASA/apix is fine with me. Thanks for heads up ----- Message ----- From: Freada Bergeron, MD Sent: 04/01/2020  10:22 AM EST To: Wonda Olds, MD  Hello Dr. Orvan Seen,   We placed a monitor on Mr. Eckhart to monitor for Afib and he is having runs of RVR at home. Just wanted to touch base with you about your preference in terms of what to do with his DAPT +AC given his history of GIB. Would you prefer aspirin/apixaban or plavix/apixaban? I was going to try to avoid triple therapy if I could.   Thanks so much and hope you have a good day!  Sincerely,  [name]

## 2020-04-01 NOTE — Telephone Encounter (Signed)
Left message for patient to call back  

## 2020-04-01 NOTE — Telephone Encounter (Signed)
Notified by [employer] of critical result: 1st documentation AF with RVR, HR 120s. Pt contacted by [employer] and was asymptomatic. He does not appear to currently be on any [place] for CVA prophylaxis. Ordering provider notified. Rudean Curt, MD , [facility] 04/01/20 6:06 AM

## 2020-04-04 ENCOUNTER — Telehealth: Payer: Self-pay

## 2020-04-04 MED ORDER — APIXABAN 5 MG PO TABS: 5.0000 mg | ORAL_TABLET | Freq: Two times a day (BID) | ORAL | 1 refills | Status: DC

## 2020-04-04 NOTE — Telephone Encounter (Signed)
Received Serious Cardiac Report from [employer]  Atrial Fibrillation RVR Sustained onset, sinus tachycardia with run of V-Tach/PVCs  04:39pm CST 04/03/2020    Message from Freada Bergeron, MD sent at 04/01/2020  3:29 PM EST ----- Can we do aspirin '81mg'$  and apixaban '5mg'$  BID for him and drop the plavix?  Patient made aware of the above med changes. New Rx sent to pharmacy.   Strip showed to Dr. Johney Frame who advised to continue monitoring.

## 2020-04-04 NOTE — Progress Notes (Signed)
The patient returns for first postoperative visit status post CABG on 2020-03-08.  He he has no complaints.  He denies chest pain.  Physical exam:  BP (!) 146/88 (BP Location: Right Arm, Patient Position: Sitting)   Pulse 98   Temp (!) 97.2 F (36.2 C)   Resp 20   Ht '5\' 8"'$  (1.727 m)   Wt 81.3 kg   SpO2 95% Comment: RA with mask on  BMI 27.25 kg/m  Well-appearing man in no acute distress Clear to auscultation bilaterally Regular rate and rhythm Wounds are well-healed.  Staples are removed from the left forearm incision and the wound is Steri-Stripped  Imaging: Chest x-ray with clear lung fields and status post surgical changes  Impression: Doing well after surgery  Plan: Follow-up as needed with thoracic surgery  Kevonte Vanecek Z. Orvan Seen, [place]

## 2020-04-06 ENCOUNTER — Other Ambulatory Visit: Payer: Managed Care, Other (non HMO)

## 2020-04-06 ENCOUNTER — Other Ambulatory Visit: Payer: Self-pay

## 2020-04-06 DIAGNOSIS — E1159 Type 2 diabetes mellitus with other circulatory complications: Secondary | ICD-10-CM

## 2020-04-06 DIAGNOSIS — I480 Paroxysmal atrial fibrillation: Secondary | ICD-10-CM

## 2020-04-06 DIAGNOSIS — E782 Mixed hyperlipidemia: Secondary | ICD-10-CM

## 2020-04-06 DIAGNOSIS — I10 Essential (primary) hypertension: Secondary | ICD-10-CM

## 2020-04-06 DIAGNOSIS — I214 Non-ST elevation (NSTEMI) myocardial infarction: Secondary | ICD-10-CM

## 2020-04-06 DIAGNOSIS — I2510 Atherosclerotic heart disease of native coronary artery without angina pectoris: Secondary | ICD-10-CM

## 2020-04-06 DIAGNOSIS — Z951 Presence of aortocoronary bypass graft: Secondary | ICD-10-CM

## 2020-04-06 LAB — BASIC METABOLIC PANEL
BUN/Creatinine Ratio: 9 (ref 9–20)
BUN: 7 mg/dL (ref 6–24)
CO2: 22 mmol/L (ref 20–29)
Calcium: 10.1 mg/dL (ref 8.7–10.2)
Chloride: 97 mmol/L (ref 96–106)
Creatinine, Ser: 0.75 mg/dL — ABNORMAL LOW (ref 0.76–1.27)
GFR calc Af Amer: 117 mL/min/{1.73_m2} (ref >59)
GFR calc non Af Amer: 101 mL/min/{1.73_m2} (ref >59)
Glucose: 128 mg/dL — ABNORMAL HIGH (ref 65–99)
Potassium: 4.2 mmol/L (ref 3.5–5.2)
Sodium: 138 mmol/L (ref 134–144)

## 2020-04-07 ENCOUNTER — Other Ambulatory Visit: Payer: Self-pay | Admitting: Physician Assistant

## 2020-04-08 ENCOUNTER — Telehealth (HOSPITAL_COMMUNITY): Payer: Self-pay

## 2020-04-08 NOTE — Telephone Encounter (Signed)
Called and spoke with pt in regards to CR, pt stated he is interested in our VCR program. He will come in for orientation on 3/31 @ 9AM.  Mailed letter

## 2020-04-14 NOTE — Telephone Encounter (Signed)
Conversation (Newest Message First)  Wilma Flavin, RN     04/04/20 12:41 PM Note Received Serious Cardiac Report from [employer]  Atrial Fibrillation RVR Sustained onset, sinus tachycardia with run of V-Tach/PVCs  04:39pm CST 04/03/2020    Message from Freada Bergeron, MD sent at 04/01/2020 3:29 PM EST ----- Can we do aspirin '81mg'$  and apixaban '5mg'$  BID for him and drop the plavix?  Patient made aware of the above med changes. New Rx sent to pharmacy.   Strip showed to Dr. Johney Frame who advised to continue monitoring.

## 2020-05-04 NOTE — Telephone Encounter (Signed)
See phone note on 04/04/20.

## 2020-05-06 ENCOUNTER — Telehealth: Payer: Self-pay

## 2020-05-06 NOTE — Telephone Encounter (Signed)
Colton Bergeron, MD  04/28/2020 5:31 PM EST      His cardiac monitor showed that he had episodes of Afib for which he is already on a blood thinner. Otherwise he had some extra beats from the bottom chamber called PVCs. The metoprolol should help suppress these and we can always increase the medication if needed if he is symptomatic.   The patient has been notified of the result and verbalized understanding.  All questions (if any) were answered. Antonieta Iba, RN 05/06/2020 3:37 PM

## 2020-05-08 ENCOUNTER — Other Ambulatory Visit: Payer: Self-pay | Admitting: Physician Assistant

## 2020-05-09 ENCOUNTER — Other Ambulatory Visit: Payer: Self-pay | Admitting: Physician Assistant

## 2020-05-09 ENCOUNTER — Ambulatory Visit: Payer: Managed Care, Other (non HMO) | Admitting: Cardiology

## 2020-05-10 ENCOUNTER — Other Ambulatory Visit: Payer: Self-pay | Admitting: Physician Assistant

## 2020-05-15 NOTE — Progress Notes (Signed)
Cardiology Office Note:    Date:  05/17/2020   ID:  Colton Neptune Darnelle Loewy., DOB [birth year], MRN [id number]  PCP:  Colton Lima, MD   [place]  Cardiologist:  Colton Bergeron, MD  Advanced Practice Provider:  No care team member to display Electrophysiologist:  None    Referring MD: Colton Pepper, MD    History of Present Illness:    Colton Guzman. is a 60 y.o. male with a hx of DMII, HLD and HTN and recent prolonged admission at [facility] hospital where he presented with hematochezia with course complicated by chest pain/VT arrest requiring CPR later found to have multivessel CAD s/p CABG on 03/08/20. He now presents to clinic for follow-up from his recent hospitalization.  Patient was hospitalized at [facility] from 02/22/20-03/13/20 where he presented with hematochezia from diverticular source. On 02/23/20, the patient developed chest pain in the setting of a blood bowel movement that initially responded to SL NTG but then recurred with subsequent VT arrest requiring 2-71mn CPR and shock. Post-shock rhythm Afib. [place] at [id number]. He was stabilized from GIB standpoint and underwent coronary angiography on 03/04/20 which showed 70% stenosis of the proximal RCA with 50% stenosis of the distal RCA, 70% stenosis of the right PDA, 99% stenosis of the third right PL, 99% stenosis of the second right PL, 90% stenosis of the mid LAD with an ostial LAD stenosis of 70%, 100% stenosis of the ostial to proximal circumflex, and 60% stenosis of the mid LM to ostial LAD. Due to his severe proximal LAD stenosis and severe mid LAD stenosis coronary bypass grafting was recommended. He underwent successful CABG x5 on 03/08/20 with Guzman to distal LAD, SVG to PDA., SVG tp posterior lateral branch of the right coronary artery, left radial artery to the ramus intermediate and first obtuse marginal coronary arteries. Following the procedure, he separated from cardiopulmonary  bypass without difficulty. He did well and was successfully discharged home on 03/13/20.  Last seen in clinic by me on 03/28/20 where he was doing well. Obtained cardiac monitor which showed <1% burden of Afib, 3% burden of PVCs/NSVT. Given Afib outside of the post-operative setting, his plavix was stopped and [facility] He now returns to clinic for follow-up.  Today, the patient feels well. Has some pain in his shoulder that improves with tramadol. No chest pain, shortness of breath. He is active and is able to walk without needing to stop to catch his breath. Enrolled in cardiac rehab. Blood pressure is mainly 120-140s at home. Occasional palpitations but no lightheadedness, dizziness or syncope.   His wife is with him today and is requesting FMLA paperwork to take him to appointments.  Past Medical History:  Diagnosis Date  . [place] mellitus [place] complication [place]   . Hypertension     Past Surgical History:  Procedure Laterality Date  . COLONOSCOPY WITH PROPOFOL Left 02/27/2020   Procedure: COLONOSCOPY WITH PROPOFOL;  Surgeon: BOtis Brace MD;  Location: [facility];  Service: Gastroenterology;  Laterality: Left;  . CORONARY ARTERY BYPASS GRAFT N/A 03/08/2020   Procedure: CORONARY ARTERY BYPASS GRAFTING (CABG) x 5 ON PUMP;  Surgeon: AWonda Olds MD;  [place]  Service: Open Heart Surgery;  Laterality: N/A;  . ENDOVEIN HARVEST OF GREATER SAPHENOUS VEIN Right 03/08/2020   Procedure: ENDOVEIN HARVEST OF GREATER SAPHENOUS VEIN;  Surgeon: AWonda Olds MD;  [place]  Service: Open Heart Surgery;  Laterality: Right;  . ESOPHAGOGASTRODUODENOSCOPY (EGD) WITH PROPOFOL N/A 02/27/2020   Procedure: ESOPHAGOGASTRODUODENOSCOPY (EGD) WITH PROPOFOL;  Surgeon: Otis Brace, MD;  Location: [facility];  Service: Gastroenterology;  Laterality: N/A;  . LEFT HEART CATH AND CORONARY ANGIOGRAPHY N/A 03/04/2020   Procedure: LEFT HEART CATH AND  CORONARY ANGIOGRAPHY;  Surgeon: Burnell Blanks, MD;  Location: [place] [facility];  Service: Cardiovascular;  Laterality: N/A;  . RADIAL ARTERY HARVEST Left 03/08/2020   Procedure: RADIAL ARTERY HARVEST;  Surgeon: Wonda Olds, MD;  Location: [place];  Service: Open Heart Surgery;  Laterality: Left;  . TEE WITHOUT CARDIOVERSION N/A 03/08/2020   Procedure: TRANSESOPHAGEAL ECHOCARDIOGRAM (TEE);  Surgeon: Wonda Olds, MD;  Location: [place];  Service: Open Heart Surgery;  Laterality: N/A;    Current Medications: Current Meds  Medication Sig  . amLODipine (NORVASC) 10 MG tablet Take 10 mg by mouth daily.  Marland Kitchen apixaban (ELIQUIS) 5 MG TABS tablet Take 1 tablet (5 mg total) by mouth 2 (two) times daily.  . Ascorbic Acid (VITAMIN C) 1000 MG tablet Take 1,000 mg by mouth daily.  Marland Kitchen aspirin EC 81 MG tablet Take 1 tablet (81 mg total) by mouth daily. Swallow whole.  Marland Kitchen atorvastatin (LIPITOR) 80 MG tablet Take 1 tablet (80 mg total) by mouth daily.  Marland Kitchen glyBURIDE (DIABETA) 5 MG tablet Take 5 mg by mouth 2 (two) times daily with a meal.  . losartan (COZAAR) 50 MG tablet Take 1 tablet (50 mg total) by mouth daily.  . Magnesium 300 MG CAPS Take 300 mg by mouth daily.  . metFORMIN (GLUCOPHAGE) 1000 MG tablet Take 1,000 mg by mouth 2 (two) times daily with a meal.  . metoprolol succinate (TOPROL XL) 25 MG 24 hr tablet Take 1 tablet (25 mg total) by mouth daily.  . Multiple Vitamin (MULTIVITAMIN WITH MINERALS) TABS tablet Take 1 tablet by mouth daily.  . traMADol (ULTRAM) 50 MG tablet Take 1 tablet (50 mg total) by mouth every 6 (six) hours as needed.  . Vitamin D, Ergocalciferol, (DRISDOL) 1.25 MG (50000 UNIT) CAPS capsule Take 50,000 Units by mouth every 7 (seven) days.  . [DISCONTINUED] losartan (COZAAR) 25 MG tablet Take 1 tablet (25 mg total) by mouth daily.     Allergies:   Patient has no known allergies.   Social History   Socioeconomic History  . Marital status: Married    Spouse name:  Not on file  . Number of children: Not on file  . Years of education: Not on file  . Highest education level: Not on file  Occupational History  . Not on file  Tobacco Use  . Smoking status: Never Smoker  . Smokeless tobacco: Never Used  Substance and Sexual Activity  . Alcohol use: Yes  . Drug use: Not on file  . Sexual activity: Not on file  Other Topics Concern  . Not on file  Social History Narrative  . Not on file   Social Determinants of Health   Financial Resource Strain: Not on file  Food Insecurity: Not on file  Transportation Needs: Not on file  Physical Activity: Not on file  Stress: Not on file  Social Connections: Not on file     Family History: The patient's family history is not on file.  ROS:   Please see the history of present illness.    Review of Systems  Constitutional: Negative for chills and fever.  HENT: Negative for hearing loss.   Eyes: Negative for blurred  vision and redness.  Respiratory: Negative for shortness of breath.   Cardiovascular: Negative for chest pain, palpitations, orthopnea, claudication, leg swelling and PND.  Gastrointestinal: Negative for melena, nausea and vomiting.  Genitourinary: Negative for dysuria and flank pain.  Musculoskeletal: Positive for joint pain and myalgias.  Neurological: Negative for dizziness and loss of consciousness.  Psychiatric/Behavioral: Negative for substance abuse.    EKGs/Labs/Other Studies Reviewed:    The following studies were reviewed today: Cardiac Monitor 03/28/20:  Patient monitoring period was from 03/28/20-04/26/20  Overall burden of Afib was <1% (1 hour and 31 minutes in total). HR ranged in Afib from 93-157bpm  Predominant rhythm was NSR with average HR 93bpm (range from 65bpm to 171bpm)  Occasional ventricular ectopy (both PVCs and NSVT) with 3% burden; no sustained VT  Rare SVE <1%  No signficant pauses or sustained ventricular arrhythmias    Echocardiogram  02/25/2020: Impressions: 1. LVEF 50-55% with wall motion abnormalities suspicious for mid LAD. No  apical thrombus.  2. Left ventricular ejection fraction, by estimation, is 50 to 55%. The  left ventricle has low normal function. The left ventricle demonstrates  regional wall motion abnormalities (see scoring diagram/findings for  description). Left ventricular diastolic  parameters are consistent with Grade II diastolic dysfunction  (pseudonormalization). Elevated left atrial pressure.  3. Right ventricular systolic function is normal. The right ventricular  size is normal. There is moderately elevated pulmonary artery systolic  pressure. The estimated right ventricular systolic pressure is [id number] mmHg.  4. Left atrial size was mildly dilated.  5. The mitral valve is normal in structure. Moderate mitral valve  regurgitation. No evidence of mitral stenosis.  6. The aortic valve is normal in structure. Aortic valve regurgitation is  not visualized. Mild to moderate aortic valve sclerosis/calcification is  present, without any evidence of aortic stenosis.  7. The inferior vena cava is normal in size with greater than 50%  respiratory variability, suggesting right atrial pressure of 3 mmHg.    Diagnostic cath 03/04/20 Dominance: Right     EKG:  EKG NSR, septal q waves, isolated PVC, HR 92  Recent Labs: 02/23/2020: TSH 1.033 03/08/2020: ALT 70 03/09/2020: Magnesium 2.1 03/12/2020: Hemoglobin 8.0; Platelets 190 04/06/2020: BUN 7; Creatinine, Ser 0.75; Potassium 4.2; Sodium 138  Recent Lipid Panel No results found for: CHOL, TRIG, HDL, CHOLHDL, VLDL, LDLCALC, LDLDIRECT   Risk Assessment/Calculations:    CHA2DS2-VASc Score = 2  This indicates a 2.2% annual risk of stroke. The patient's score is based upon: CHF History: No HTN History: Yes Diabetes History: Yes Stroke History: No Vascular Disease History: No Age Score: 0 Gender Score: 0   Physical Exam:    VS:  BP (!)  142/90   Pulse 92   Ht 5' 8.5" (1.74 m)   Wt 179 lb (81.2 kg)   SpO2 96%   BMI 26.82 kg/m     Wt Readings from Last 3 Encounters:  05/17/20 179 lb (81.2 kg)  03/28/20 179 lb 3.2 oz (81.3 kg)  03/28/20 178 lb 9.6 oz (81 kg)     GEN:  Well nourished, well developed in no acute distress HEENT: Normal NECK: No JVD; No carotid bruits CARDIAC: RRR, 2/6 systolic murmur, no rubs or gallops RESPIRATORY:  Clear to auscultation without rales, wheezing or rhonchi  ABDOMEN: Soft, non-tender, non-distended MUSCULOSKELETAL:  No edema; No deformity  SKIN: Warm and dry NEUROLOGIC:  Alert and oriented x 3 PSYCHIATRIC:  Normal affect   ASSESSMENT:    1. Coronary artery  disease involving native coronary artery of native heart without angina pectoris   2. Primary hypertension   3. Non-ST elevation (NSTEMI) myocardial infarction ([employer])   4. H/O five vessel coronary artery bypass   5. Mixed hyperlipidemia   6. Paroxysmal atrial fibrillation (HCC)   7. Type 2 diabetes mellitus with other circulatory complication, without long-term current use of insulin (HCC)    PLAN:    In order of problems listed above:  #Multivessel CAD #VT Arrest #NSTEMI: Patient presented with hematochezia with course complicated by chest pain with VT arrest on 01/04 requiring shock and 2-87mn of CPR with return of ROSC. High sensitivity troponin >15,000.EKGwithQ waves in V1-V2 and as well as slight ST depression in V4-V6 and lead II. TTE withLVEF of 50-55% with hypokinesis of mid and distal anterior wall, mid and distal anterior septum, and apex as well as grade 2 diastolic dysfunction. Moderate MR and moderately elevated PASP of 48.7 mmHg. He was stabilized from GIB standpoint with successful extubation on 1/11 and underwent coronary angiography found to have multivessel CADnow s/pCABGon 03/08/20. Doing well post-operativelynow transferred out of ICU. -S/p CABGon 1/18withLIMA -> LAD,LRA -> Ramus -> OM,SVG ->  PDA,SVG -> PLB of RCA -Continue ASA and statin -No plavix given need for AC -Continue metop to '25mg'$  XL  -Increase losartan to '50mg'$  daily -BMET -Follow-up with CV surgery as scheduled -Needs cardiac rehab once cleared by surgery  #Paroxysmal Afib: Post-arrest rhythm demonstrated Afib with RVR. Cardiac monitor placed after discharge with episodes of sustained Afib (<1% burden) now on [facility] CHADs-vasc 3 with 4% annual risk of stroke.  -Cardiac monitor with recurrent Afib -Continue eliquis '5mg'$  BID -Continue metop '25mg'$  XL daily  #HTN: -Continue metop to '25mg'$  XL  -Increase losartan to '50mg'$  daily -Goal Bps <120s/80s  #HLD: -Continue atorvastatin '80mg'$  daily  #GIB likely Secondary to Diverticular Bleed: Colonoscopy on 1/8 showed red clotted blood in the entire colon with multiple diverticula throughout. Felt to be from diverticular bleed. No further bleeding.  - EGD on 1/8 negative for bleeding or ulcer.  - S/p a total of 7 units of PRBCs (last received 4 units on 1/8) -No recurrent bleeding--continue to monitor  #DMII:  Blood glucose much better controlled at home. -Continue metformin   Medication Adjustments/Labs and Tests Ordered: Current medicines are reviewed at length with the patient today.  Concerns regarding medicines are outlined above.  Orders Placed This Encounter  Procedures  . Basic metabolic panel  . EKG 12-Lead   Meds ordered this encounter  Medications  . losartan (COZAAR) 50 MG tablet    Sig: Take 1 tablet (50 mg total) by mouth daily.    Dispense:  90 tablet    Refill:  3    Patient Instructions  Medication Instructions:  Your physician has recommended you make the following change in your medication:  1) INCREASE Losartan to 50 mg daily  *If you need a refill on your cardiac medications before your next appointment, please call your pharmacy*   Lab Work: BMET on 04/05 between 7:30am and 4:30pm If you have labs (blood work) drawn today and  your tests are completely normal, you will receive your results only by: .Marland KitchenMyChart Message (if you have MyChart) OR . A paper copy in the mail If you have any lab test that is abnormal or we need to change your treatment, we will call you to review the results.   Follow-Up: [facility].  As part of our continuing mission to provide you with exceptional heart care, we have created designated Provider Care Teams.  These Care Teams include your primary Cardiologist (physician) and Advanced Practice Providers (APPs -  Physician Assistants and Nurse Practitioners) who all work together to provide you with the care you need, when you need it.  Your next appointment:   3 month(s)  The format for your next appointment:   In Person  Provider:   You may see Colton Bergeron, MD or one of the following Advanced Practice Providers on your designated Care Team:    Richardson Dopp, PA-C  Robbie Lis, [place]        Signed, Colton Bergeron, MD  05/17/2020 5:18 PM    [place]

## 2020-05-16 ENCOUNTER — Telehealth (HOSPITAL_COMMUNITY): Payer: Self-pay | Admitting: Pharmacist

## 2020-05-16 NOTE — Telephone Encounter (Signed)
Cardiac Rehab Medication Review by a Pharmacist  Does the patient  feel that his/her medications are working for him/her?  yes  Has the patient been experiencing any side effects to the medications prescribed? Patient reports history of diverticulitis with 1-2 drops of blood on toilet tissue after bowel movements. Reports occurred for 2 days and then stopped. No other bleeding reported from other sites. Encouraged patient to report any new or worsening bleeding as he does take Eliquis.   Does the patient measure his/her own blood pressure or blood glucose at home?   No.  Does the patient have any problems obtaining medications due to transportation or finances?   Yes -- the eliquis tends to be expensive but has been able to pick it up.    Understanding of regimen: good Understanding of indications: good Potential of compliance: good   Pharmacist Intervention: please resend appointment information to patient (address, etc.) via mychart and patient is interested in the $10/month Eliquis co-pay card if eligible.     Colton Guzman, PharmD PGY1 Pharmacy Resident 05/16/2020 10:39 AM

## 2020-05-17 ENCOUNTER — Encounter: Payer: Self-pay | Admitting: Cardiology

## 2020-05-17 ENCOUNTER — Other Ambulatory Visit: Payer: Self-pay

## 2020-05-17 ENCOUNTER — Ambulatory Visit (INDEPENDENT_AMBULATORY_CARE_PROVIDER_SITE_OTHER): Payer: Managed Care, Other (non HMO) | Admitting: Cardiology

## 2020-05-17 VITALS — BP 142/90 | HR 92 | Ht 68.5 in | Wt 179.0 lb

## 2020-05-17 DIAGNOSIS — I10 Essential (primary) hypertension: Secondary | ICD-10-CM | POA: Diagnosis not present

## 2020-05-17 DIAGNOSIS — I214 Non-ST elevation (NSTEMI) myocardial infarction: Secondary | ICD-10-CM | POA: Diagnosis not present

## 2020-05-17 DIAGNOSIS — E782 Mixed hyperlipidemia: Secondary | ICD-10-CM

## 2020-05-17 DIAGNOSIS — I2510 Atherosclerotic heart disease of native coronary artery without angina pectoris: Secondary | ICD-10-CM

## 2020-05-17 DIAGNOSIS — E1159 Type 2 diabetes mellitus with other circulatory complications: Secondary | ICD-10-CM

## 2020-05-17 DIAGNOSIS — Z951 Presence of aortocoronary bypass graft: Secondary | ICD-10-CM | POA: Diagnosis not present

## 2020-05-17 DIAGNOSIS — I480 Paroxysmal atrial fibrillation: Secondary | ICD-10-CM

## 2020-05-17 MED ORDER — LOSARTAN POTASSIUM 50 MG PO TABS: 50.0000 mg | ORAL_TABLET | Freq: Every day | ORAL | 3 refills | Status: DC

## 2020-05-17 NOTE — Patient Instructions (Addendum)
Medication Instructions:  Your physician has recommended you make the following change in your medication:  1) INCREASE Losartan to 50 mg daily  *If you need a refill on your cardiac medications before your next appointment, please call your pharmacy*   Lab Work: BMET on 04/05 between 7:30am and 4:30pm If you have labs (blood work) drawn today and your tests are completely normal, you will receive your results only by: Marland Kitchen MyChart Message (if you have MyChart) OR . A paper copy in the mail If you have any lab test that is abnormal or we need to change your treatment, we will call you to review the results.   Follow-Up: At [facility], you and your health needs are our priority.  As part of our continuing mission to provide you with exceptional heart care, we have created designated Provider Care Teams.  These Care Teams include your primary Cardiologist (physician) and Advanced Practice Providers (APPs -  Physician Assistants and Nurse Practitioners) who all work together to provide you with the care you need, when you need it.  Your next appointment:   3 month(s)  The format for your next appointment:   In Person  Provider:   You may see Freada Bergeron, MD or one of the following Advanced Practice Providers on your designated Care Team:    Richardson Dopp, PA-C  [place], [place]

## 2020-05-18 ENCOUNTER — Telehealth: Payer: Self-pay

## 2020-05-18 ENCOUNTER — Encounter: Payer: Self-pay | Admitting: Internal Medicine

## 2020-05-18 ENCOUNTER — Telehealth: Payer: Self-pay | Admitting: Cardiology

## 2020-05-18 ENCOUNTER — Ambulatory Visit (INDEPENDENT_AMBULATORY_CARE_PROVIDER_SITE_OTHER): Payer: Managed Care, Other (non HMO) | Admitting: Internal Medicine

## 2020-05-18 VITALS — BP 126/84 | HR 92 | Temp 98.3°F | Ht 68.5 in | Wt 182.0 lb

## 2020-05-18 DIAGNOSIS — E1169 Type 2 diabetes mellitus with other specified complication: Secondary | ICD-10-CM | POA: Insufficient documentation

## 2020-05-18 DIAGNOSIS — M159 Polyosteoarthritis, unspecified: Secondary | ICD-10-CM

## 2020-05-18 DIAGNOSIS — D539 Nutritional anemia, unspecified: Secondary | ICD-10-CM | POA: Insufficient documentation

## 2020-05-18 DIAGNOSIS — D510 Vitamin B12 deficiency anemia due to intrinsic factor deficiency: Secondary | ICD-10-CM

## 2020-05-18 DIAGNOSIS — R7989 Other specified abnormal findings of blood chemistry: Secondary | ICD-10-CM

## 2020-05-18 DIAGNOSIS — E1159 Type 2 diabetes mellitus with other circulatory complications: Secondary | ICD-10-CM | POA: Diagnosis not present

## 2020-05-18 DIAGNOSIS — M1A09X0 Idiopathic chronic gout, multiple sites, without tophus (tophi): Secondary | ICD-10-CM

## 2020-05-18 DIAGNOSIS — M8949 Other hypertrophic osteoarthropathy, multiple sites: Secondary | ICD-10-CM

## 2020-05-18 DIAGNOSIS — E785 Hyperlipidemia, unspecified: Secondary | ICD-10-CM

## 2020-05-18 DIAGNOSIS — K625 Hemorrhage of anus and rectum: Secondary | ICD-10-CM | POA: Diagnosis not present

## 2020-05-18 DIAGNOSIS — Z23 Encounter for immunization: Secondary | ICD-10-CM

## 2020-05-18 DIAGNOSIS — R1900 Intra-abdominal and pelvic swelling, mass and lump, unspecified site: Secondary | ICD-10-CM

## 2020-05-18 LAB — HEPATIC FUNCTION PANEL
ALT: 16 U/L (ref 0–53)
AST: 16 U/L (ref 0–37)
Albumin: 4.4 g/dL (ref 3.5–5.2)
Alkaline Phosphatase: 75 U/L (ref 39–117)
Bilirubin, Direct: 0.1 mg/dL (ref 0.0–0.3)
Total Bilirubin: 0.4 mg/dL (ref 0.2–1.2)
Total Protein: 8.6 g/dL — ABNORMAL HIGH (ref 6.0–8.3)

## 2020-05-18 LAB — POCT GLYCOSYLATED HEMOGLOBIN (HGB A1C): Hemoglobin A1C: 7 % — AB (ref 4.0–5.6)

## 2020-05-18 LAB — CBC WITH DIFFERENTIAL/PLATELET
Basophils Absolute: 0 10*3/uL (ref 0.0–0.1)
Basophils Relative: 0.6 % (ref 0.0–3.0)
Eosinophils Absolute: 0.2 10*3/uL (ref 0.0–0.7)
Eosinophils Relative: 2.9 % (ref 0.0–5.0)
HCT: 37.9 % — ABNORMAL LOW (ref 39.0–52.0)
Hemoglobin: 12.5 g/dL — ABNORMAL LOW (ref 13.0–17.0)
Lymphocytes Relative: 35.7 % (ref 12.0–46.0)
Lymphs Abs: 2.9 10*3/uL (ref 0.7–4.0)
MCHC: 32.9 g/dL (ref 30.0–36.0)
MCV: 83 fl (ref 78.0–100.0)
Monocytes Absolute: 0.4 10*3/uL (ref 0.1–1.0)
Monocytes Relative: 5.2 % (ref 3.0–12.0)
Neutro Abs: 4.5 10*3/uL (ref 1.4–7.7)
Neutrophils Relative %: 55.6 % (ref 43.0–77.0)
Platelets: 187 10*3/uL (ref 150.0–400.0)
RBC: 4.57 Mil/uL (ref 4.22–5.81)
RDW: 17.1 % — ABNORMAL HIGH (ref 11.5–15.5)
WBC: 8.1 10*3/uL (ref 4.0–10.5)

## 2020-05-18 LAB — BASIC METABOLIC PANEL
BUN: 9 mg/dL (ref 6–23)
CO2: 30 mEq/L (ref 19–32)
Calcium: 9.9 mg/dL (ref 8.4–10.5)
Chloride: 97 mEq/L (ref 96–112)
Creatinine, Ser: 0.66 mg/dL (ref 0.40–1.50)
GFR: 103.65 mL/min (ref >60.00)
Glucose, Bld: 140 mg/dL — ABNORMAL HIGH (ref 70–99)
Potassium: 3.5 mEq/L (ref 3.5–5.1)
Sodium: 137 mEq/L (ref 135–145)

## 2020-05-18 LAB — FERRITIN: Ferritin: 28.8 ng/mL (ref 22.0–322.0)

## 2020-05-18 LAB — PROTIME-INR
INR: 1.2 ratio — ABNORMAL HIGH (ref 0.8–1.0)
Prothrombin Time: 13.6 s — ABNORMAL HIGH (ref 9.6–13.1)

## 2020-05-18 LAB — VITAMIN B12: Vitamin B-12: 101 pg/mL — ABNORMAL LOW (ref 211–911)

## 2020-05-18 LAB — IRON: Iron: 57 ug/dL (ref 42–165)

## 2020-05-18 LAB — FOLATE: Folate: >23.6 ng/mL (ref >5.9)

## 2020-05-18 LAB — URIC ACID: Uric Acid, Serum: 7.6 mg/dL (ref 4.0–7.8)

## 2020-05-18 MED ORDER — TRAMADOL HCL 50 MG PO TABS: 50.0000 mg | ORAL_TABLET | Freq: Four times a day (QID) | ORAL | 3 refills | Status: DC | PRN

## 2020-05-18 MED ORDER — DAPAGLIFLOZIN PROPANEDIOL 10 MG PO TABS: 10.0000 mg | ORAL_TABLET | Freq: Every day | ORAL | 1 refills | Status: DC

## 2020-05-18 NOTE — Progress Notes (Signed)
Subjective:  Patient ID: Colton Dell., male    DOB: [birth year]  Age: 60 y.o. MRN: [id number]  CC: Anemia, Diabetes, Hyperlipidemia, and Osteoarthritis  This visit occurred during the SARS-CoV-2 public health emergency.  Safety protocols were in place, including screening questions prior to the visit, additional usage of staff PPE, and extensive cleaning of exam room while observing appropriate contact time as indicated for disinfecting solutions.    HPI [facility] [employer]. presents for f/up and to establish.  He was admitted a few months ago for diverticulitis and during the admission he had a cardiac arrest and was found to have CAD, he underwent an urgent CABG.  He continues to complain of fatigue and dizzy spells but denies lightheadedness, chest pain, shortness of breath, palpitations, edema, or near syncope.  He has a mass in his left groin notes been growing over the last year.  It does not cause much discomfort.  He has a history of osteoarthritis and elevated uric acid level.  He complains of polydipsia and nocturia about 2-3 times per night.  At the time of discharge she was prescribed a sulfonylurea but he is no longer taking it because it caused several episodes of symptomatic hypoglycemia.  His last hypoglycemic episode was about 6 weeks ago.  He continues to have episodes of bright red blood per rectum.  During his admission he was anemic.  He is not aware of any other sources of blood loss.  He also had elevated liver enzymes.  History [place] has a past medical history of Diabetes mellitus without complication ([place]) and Hypertension.   He has a past surgical history that includes Esophagogastroduodenoscopy (egd) with propofol (N/A, 02/27/2020); Colonoscopy with propofol (Left, 02/27/2020); LEFT HEART CATH AND CORONARY ANGIOGRAPHY (N/A, 03/04/2020); Coronary artery bypass graft (N/A, 03/08/2020); TEE without cardioversion (N/A, 03/08/2020); Endoharvest vein of greater  saphenous vein (Right, 03/08/2020); and Radial artery harvest (Left, 03/08/2020).   His family history is not on file.He reports that he has never smoked. He has never used smokeless tobacco. He reports current alcohol use. No history on file for drug use.  Outpatient Medications Prior to Visit  Medication Sig Dispense Refill  . amLODipine (NORVASC) 10 MG tablet Take 10 mg by mouth daily.    Marland Kitchen apixaban (ELIQUIS) 5 MG TABS tablet Take 1 tablet (5 mg total) by mouth 2 (two) times daily. 60 tablet 1  . Ascorbic Acid (VITAMIN C) 1000 MG tablet Take 1,000 mg by mouth daily.    Marland Kitchen aspirin EC 81 MG tablet Take 1 tablet (81 mg total) by mouth daily. Swallow whole. 150 tablet 2  . atorvastatin (LIPITOR) 80 MG tablet Take 1 tablet (80 mg total) by mouth daily. 30 tablet 2  . losartan (COZAAR) 50 MG tablet Take 1 tablet (50 mg total) by mouth daily. 90 tablet 3  . Magnesium 300 MG CAPS Take 300 mg by mouth daily.    . metFORMIN (GLUCOPHAGE) 1000 MG tablet Take 1,000 mg by mouth 2 (two) times daily with a meal.    . metoprolol succinate (TOPROL XL) 25 MG 24 hr tablet Take 1 tablet (25 mg total) by mouth daily. 30 tablet 11  . Multiple Vitamin (MULTIVITAMIN WITH MINERALS) TABS tablet Take 1 tablet by mouth daily.    . Vitamin D, Ergocalciferol, (DRISDOL) 1.25 MG (50000 UNIT) CAPS capsule Take 50,000 Units by mouth every 7 (seven) days.    . traMADol (ULTRAM) 50 MG tablet Take 1 tablet (50 mg total)  by mouth every 6 (six) hours as needed. 20 tablet 0  . glyBURIDE (DIABETA) 5 MG tablet Take 5 mg by mouth 2 (two) times daily with a meal.     No facility-administered medications prior to visit.    ROS Review of Systems  Constitutional: Positive for fatigue. Negative for chills, diaphoresis, fever and unexpected weight change.  HENT: Negative.  Negative for sore throat and trouble swallowing.   Eyes: Negative.   Respiratory: Negative for cough, chest tightness, shortness of breath and wheezing.    Cardiovascular: Negative for chest pain, palpitations and leg swelling.  Gastrointestinal: Positive for blood in stool. Negative for abdominal pain, constipation, diarrhea, nausea and vomiting.  Endocrine: Positive for polydipsia and polyuria. Negative for polyphagia.  Genitourinary: Positive for urgency. Negative for difficulty urinating, frequency and scrotal swelling.  Musculoskeletal: Positive for arthralgias. Negative for back pain and myalgias.  Skin: Negative.  Negative for color change.  Neurological: Positive for dizziness. Negative for weakness, light-headedness and numbness.  Hematological: Negative for adenopathy. Does not bruise/bleed easily.  Psychiatric/Behavioral: Negative.     Objective:  BP 126/84   Pulse 92   Temp 98.3 F (36.8 C) (Oral)   Ht 5' 8.5" (1.74 m)   Wt 182 lb (82.6 kg)   SpO2 97%   BMI 27.27 kg/m   Physical Exam Vitals reviewed.  Constitutional:      Appearance: Normal appearance.  HENT:     Nose: Nose normal.     Mouth/Throat:     Mouth: Mucous membranes are moist. Mucous membranes are pale.  Eyes:     General: No scleral icterus.    Conjunctiva/sclera: Conjunctivae normal.  Cardiovascular:     Rate and Rhythm: Normal rate and regular rhythm.     Heart sounds: No murmur heard.   Pulmonary:     Effort: Pulmonary effort is normal.     Breath sounds: No stridor. No wheezing, rhonchi or rales.  Abdominal:     General: Abdomen is flat.     Palpations: There is mass.     Tenderness: There is no abdominal tenderness. There is no guarding or rebound.     Hernia: No hernia is present. There is no hernia in the right inguinal area.  Genitourinary:    Pubic Area: No rash.      Penis: Normal.      Testes: Normal.     Epididymis:     Right: Normal.     Left: Normal.    Musculoskeletal:        General: Normal range of motion.     Cervical back: Neck supple.     Right lower leg: No edema.     Left lower leg: No edema.  Lymphadenopathy:      Cervical: No cervical adenopathy.     Lower Body: No right inguinal adenopathy. No left inguinal adenopathy.  Skin:    General: Skin is warm and dry.     Findings: No rash.  Neurological:     General: No focal deficit present.     Mental Status: He is alert.  Psychiatric:        Mood and Affect: Mood normal.        Behavior: Behavior normal.     Lab Results  Component Value Date   WBC 8.1 05/18/2020   HGB 12.5 (L) 05/18/2020   HCT 37.9 (L) 05/18/2020   PLT 187.0 05/18/2020   GLUCOSE 140 (H) 05/18/2020   CHOL 172 12/21/2019   TRIG 433 (  A) 12/21/2019   HDL 30 (A) 12/21/2019   LDLCALC 73 12/21/2019   ALT 16 05/18/2020   AST 16 05/18/2020   NA 137 05/18/2020   K 3.5 05/18/2020   CL 97 05/18/2020   CREATININE 0.66 05/18/2020   BUN 9 05/18/2020   CO2 30 05/18/2020   TSH 1.033 02/23/2020   PSA 0.2 12/21/2019   INR 1.2 (H) 05/18/2020   HGBA1C 7.0 (A) 05/18/2020   MICROALBUR 387 12/21/2019    Assessment & Plan:   Arrin was seen today for anemia, diabetes, hyperlipidemia and osteoarthritis.  Diagnoses and all orders for this visit:  Need for pneumococcal vaccination -     Pneumococcal conjugate vaccine 20-valent  Type 2 diabetes mellitus with other circulatory complication, without long-term current use of insulin ([place])- His A1c is at 7.0%.  I recommended that he start taking an SGLT2 inhibitor for cardiorenal protection. -     POCT glycosylated hemoglobin (Hb A1C) -     Basic metabolic panel; Future -     Ambulatory referral to Ophthalmology -     HM Diabetes Foot Exam -     dapagliflozin propanediol (FARXIGA) 10 MG TABS tablet; Take 1 tablet (10 mg total) by mouth daily before breakfast. -     Basic metabolic panel  Elevated LFTs- His LFTs are normal now.  His MELD score is reassuring at 8.  I will screen him for viral hepatitis. -     Hepatic function panel; Future -     Protime-INR; Future -     Hepatitis A antibody, total; Future -     Hepatitis B core  antibody, total; Future -     Hepatitis C antibody; Future -     Hepatitis B surface antibody,quantitative; Future -     Hepatitis B surface antigen; Future -     Hepatitis B surface antigen -     Hepatitis B surface antibody,quantitative -     Hepatitis C antibody -     Hepatitis B core antibody, total -     Hepatitis A antibody, total -     Protime-INR -     Hepatic function panel  Deficiency anemia- His H&H have improved.  I will screen him for vitamin deficiencies. -     CBC with Differential/Platelet; Future -     Vitamin B12; Future -     Iron; Future -     Vitamin B1; Future -     Folate; Future -     Ferritin; Future -     Ferritin -     Folate -     Vitamin B1 -     Iron -     Vitamin B12 -     CBC with Differential/Platelet  BRBPR (bright red blood per rectum) -     Ambulatory referral to Gastroenterology  Dyslipidemia, goal LDL below 70- He has achieved his LDL goal and is doing well on the statin. -     Cancel: Lipid panel; Future  Pelvic mass- Based on palpation this feels like a benign lesion like a lipoma.  I recommended that he undergo a CT with contrast to be certain that there is not a malignant transformation. -     CT Abdomen Pelvis W Contrast; Future  Primary osteoarthritis involving multiple joints -     traMADol (ULTRAM) 50 MG tablet; Take 1 tablet (50 mg total) by mouth every 6 (six) hours as needed.  Idiopathic chronic gout of multiple sites  without tophus- His uric acid is too high.  I recommended that he start taking a xanthine oxidase inhibitor. -     Uric acid; Future -     Uric acid -     allopurinol (ZYLOPRIM) 100 MG tablet; Take 1 tablet (100 mg total) by mouth daily.  Vitamin B12 deficiency anemia due to intrinsic factor deficiency- He agrees to come in to start parenteral B12 replacement therapy.  Other orders -     Tdap vaccine greater than or equal to 7yo IM   I have discontinued Demyan B. Bierly Jr.'s glyBURIDE. I am also having  him start on dapagliflozin propanediol and allopurinol. Additionally, I am having him maintain his vitamin C, Vitamin D (Ergocalciferol), metFORMIN, Magnesium, multivitamin with minerals, atorvastatin, metoprolol succinate, aspirin EC, apixaban, amLODipine, losartan, and traMADol.  Meds ordered this encounter  Medications  . traMADol (ULTRAM) 50 MG tablet    Sig: Take 1 tablet (50 mg total) by mouth every 6 (six) hours as needed.    Dispense:  90 tablet    Refill:  3  . dapagliflozin propanediol (FARXIGA) 10 MG TABS tablet    Sig: Take 1 tablet (10 mg total) by mouth daily before breakfast.    Dispense:  90 tablet    Refill:  1  . allopurinol (ZYLOPRIM) 100 MG tablet    Sig: Take 1 tablet (100 mg total) by mouth daily.    Dispense:  90 tablet    Refill:  0   I spent 50 minutes in preparing to see the patient by review of recent labs, imaging and procedures, obtaining and reviewing separately obtained history, communicating with the patient and his wife, ordering medications and tests, and documenting clinical information in the EHR including the differential Dx, treatment, and any further evaluation and other management of 1. Type 2 diabetes mellitus with other circulatory complication, without long-term current use of insulin (HCC) 2. Elevated LFTs 3. Deficiency anemia 4. BRBPR (bright red blood per rectum) 5. Dyslipidemia, goal LDL below 70 6. Pelvic mass 7. Primary osteoarthritis involving multiple joints 8. Idiopathic chronic gout of multiple sites without tophus 9. Vitamin B12 deficiency anemia due to intrinsic factor deficiency     Follow-up: Return in about 4 weeks (around 06/15/2020).  Scarlette Calico, MD

## 2020-05-18 NOTE — Telephone Encounter (Signed)
Will send to HIM Dept. See phone note from [place], RN from today.

## 2020-05-18 NOTE — Telephone Encounter (Signed)
Spoke with the patient's wife and made her aware of the below information. She states that they will come by to make the payment and complete medical release form tomorrow after Cardiac Rehab. Completed forms have been placed in Dr. Jacolyn Reedy box until the above process has been complete.

## 2020-05-18 NOTE — Telephone Encounter (Signed)
Left a message for the patient's wife to call back.  She left FMLA paperwork at visit yesterday to be completed for herself to take the patient back and forth to appointments. Need to make wife aware of the need for payment for completing FMLA paperwork and that a medical release form will need to be signed by the patient.

## 2020-05-18 NOTE — Telephone Encounter (Signed)
Key: [id number]

## 2020-05-18 NOTE — Telephone Encounter (Signed)
Patient's wife was returning phone call about patient's FMLA

## 2020-05-18 NOTE — Patient Instructions (Signed)
[employer] medicine (25th ed., pp. [phone]). [place], [place]: Elsevier.">  Anemia  Anemia is a condition in which there is not enough red blood cells or hemoglobin in the blood. Hemoglobin is a substance in red blood cells that carries oxygen. When you do not have enough red blood cells or hemoglobin (are anemic), your body cannot get enough oxygen and your organs may not work properly. As a result, you may feel very tired or have other problems. What are the causes? Common causes of anemia include:  Excessive bleeding. Anemia can be caused by excessive bleeding inside or outside the body, including bleeding from the intestines or from heavy menstrual periods in females.  Poor nutrition.  Long-lasting (chronic) kidney, thyroid, and liver disease.  Bone marrow disorders, spleen problems, and blood disorders.  Cancer and treatments for cancer.  HIV (human immunodeficiency virus) and AIDS (acquired immunodeficiency syndrome).  Infections, medicines, and autoimmune disorders that destroy red blood cells. What are the signs or symptoms? Symptoms of this condition include:  Minor weakness.  Dizziness.  Headache, or difficulties concentrating and sleeping.  Heartbeats that feel irregular or faster than normal (palpitations).  Shortness of breath, especially with exercise.  Pale skin, lips, and nails, or cold hands and feet.  Indigestion and nausea. Symptoms may occur suddenly or develop slowly. If your anemia is mild, you may not have symptoms. How is this diagnosed? This condition is diagnosed based on blood tests, your medical history, and a physical exam. In some cases, a test may be needed in which cells are removed from the soft tissue inside of a bone and looked at under a microscope (bone marrow biopsy). Your health care provider may also check your stool (feces) for blood and may do additional testing to look for the cause of your bleeding. Other tests may  include:  Imaging tests, such as a CT scan or MRI.  A procedure to see inside your esophagus and stomach (endoscopy).  A procedure to see inside your colon and rectum (colonoscopy). How is this treated? Treatment for this condition depends on the cause. If you continue to lose a lot of blood, you may need to be treated at a hospital. Treatment may include:  Taking supplements of iron, vitamin L87, or folic acid.  Taking a hormone medicine (erythropoietin) that can help to stimulate red blood cell growth.  Having a blood transfusion. This may be needed if you lose a lot of blood.  Making changes to your diet.  Having surgery to remove your spleen. Follow these instructions at home:  Take over-the-counter and prescription medicines only as told by your health care provider.  Take supplements only as told by your health care provider.  Follow any diet instructions that you were given by your health care provider.  Keep all follow-up visits as told by your health care provider. This is important. Contact a health care provider if:  You develop new bleeding anywhere in the body. Get help right away if:  You are very weak.  You are short of breath.  You have pain in your abdomen or chest.  You are dizzy or feel faint.  You have trouble concentrating.  You have bloody stools, black stools, or tarry stools.  You vomit repeatedly or you vomit up blood. These symptoms may represent a serious problem that is an emergency. Do not wait to see if the symptoms will go away. Get medical help right away. Call your local emergency services (911 in the [place] Do not  drive yourself to the hospital. Summary  Anemia is a condition in which you do not have enough red blood cells or enough of a substance in your red blood cells that carries oxygen (hemoglobin).  Symptoms may occur suddenly or develop slowly.  If your anemia is mild, you may not have symptoms.  This condition is  diagnosed with blood tests, a medical history, and a physical exam. Other tests may be needed.  Treatment for this condition depends on the cause of the anemia. This information is not intended to replace advice given to you by your health care provider. Make sure you discuss any questions you have with your health care provider. Document Revised: 01/13/2019 Document Reviewed: 01/13/2019 Elsevier Patient Education  2021 [employer].

## 2020-05-19 ENCOUNTER — Encounter: Payer: Self-pay | Admitting: Internal Medicine

## 2020-05-19 ENCOUNTER — Ambulatory Visit (HOSPITAL_COMMUNITY): Payer: Managed Care, Other (non HMO)

## 2020-05-19 ENCOUNTER — Telehealth: Payer: Self-pay | Admitting: Internal Medicine

## 2020-05-19 DIAGNOSIS — M159 Polyosteoarthritis, unspecified: Secondary | ICD-10-CM | POA: Insufficient documentation

## 2020-05-19 DIAGNOSIS — D510 Vitamin B12 deficiency anemia due to intrinsic factor deficiency: Secondary | ICD-10-CM | POA: Insufficient documentation

## 2020-05-19 DIAGNOSIS — Z0279 Encounter for issue of other medical certificate: Secondary | ICD-10-CM

## 2020-05-19 DIAGNOSIS — M8949 Other hypertrophic osteoarthropathy, multiple sites: Secondary | ICD-10-CM | POA: Insufficient documentation

## 2020-05-19 NOTE — Telephone Encounter (Signed)
Approved  05/18/2020 - 11/18/2020  Determination sent to scan

## 2020-05-19 NOTE — Telephone Encounter (Signed)
Forms were given to medical records today while in clinic.  Medical records did get with the pt, collected payment, release was signed, and copy provided back to the pt.

## 2020-05-19 NOTE — Telephone Encounter (Signed)
Pt has been informed of results and expressed understanding.  °

## 2020-05-19 NOTE — Telephone Encounter (Signed)
Patient and wife picked up FMLA form for the wife. Paperwork completed by Dr. Johney Frame. Payment was made today 05/19/2020.

## 2020-05-19 NOTE — Telephone Encounter (Signed)
Please return call to patient to discuss lab results B12 scheduled

## 2020-05-21 MED ORDER — ALLOPURINOL 100 MG PO TABS: 100.0000 mg | ORAL_TABLET | Freq: Every day | ORAL | 0 refills | Status: DC

## 2020-05-22 ENCOUNTER — Other Ambulatory Visit: Payer: Self-pay | Admitting: Internal Medicine

## 2020-05-22 DIAGNOSIS — E1159 Type 2 diabetes mellitus with other circulatory complications: Secondary | ICD-10-CM

## 2020-05-22 LAB — HEPATITIS C ANTIBODY
Hepatitis C Ab: NONREACTIVE
SIGNAL TO CUT-OFF: 0.01 (ref <1.00)

## 2020-05-22 LAB — HEPATITIS B SURFACE ANTIGEN: Hepatitis B Surface Ag: NONREACTIVE

## 2020-05-22 LAB — HEPATITIS B SURFACE ANTIBODY, QUANTITATIVE: Hep B S AB Quant (Post): <5 m[IU]/mL — ABNORMAL LOW (ref >=10)

## 2020-05-22 LAB — HEPATITIS A ANTIBODY, TOTAL: Hepatitis A AB,Total: REACTIVE — AB

## 2020-05-22 LAB — VITAMIN B1: Vitamin B1 (Thiamine): 9 nmol/L (ref 8–30)

## 2020-05-22 LAB — HEPATITIS B CORE ANTIBODY, TOTAL: Hep B Core Total Ab: NONREACTIVE

## 2020-05-22 MED ORDER — GVOKE HYPOPEN 2-PACK 1 MG/0.2ML ~~LOC~~ SOAJ: 1.0000 | Freq: Every day | SUBCUTANEOUS | 5 refills | Status: DC | PRN

## 2020-05-24 ENCOUNTER — Other Ambulatory Visit: Payer: Self-pay

## 2020-05-24 ENCOUNTER — Other Ambulatory Visit: Payer: Managed Care, Other (non HMO)

## 2020-05-24 DIAGNOSIS — I10 Essential (primary) hypertension: Secondary | ICD-10-CM

## 2020-05-24 LAB — BASIC METABOLIC PANEL
BUN/Creatinine Ratio: 8 — ABNORMAL LOW (ref 9–20)
BUN: 6 mg/dL (ref 6–24)
CO2: 26 mmol/L (ref 20–29)
Calcium: 9.9 mg/dL (ref 8.7–10.2)
Chloride: 94 mmol/L — ABNORMAL LOW (ref 96–106)
Creatinine, Ser: 0.75 mg/dL — ABNORMAL LOW (ref 0.76–1.27)
Glucose: 234 mg/dL — ABNORMAL HIGH (ref 65–99)
Potassium: 3.6 mmol/L (ref 3.5–5.2)
Sodium: 138 mmol/L (ref 134–144)
eGFR: 105 mL/min/{1.73_m2} (ref >59)

## 2020-05-25 ENCOUNTER — Ambulatory Visit (INDEPENDENT_AMBULATORY_CARE_PROVIDER_SITE_OTHER): Payer: Managed Care, Other (non HMO)

## 2020-05-25 DIAGNOSIS — Z1159 Encounter for screening for other viral diseases: Secondary | ICD-10-CM | POA: Diagnosis not present

## 2020-05-25 DIAGNOSIS — D510 Vitamin B12 deficiency anemia due to intrinsic factor deficiency: Secondary | ICD-10-CM

## 2020-05-25 MED ORDER — CYANOCOBALAMIN 1000 MCG/ML IJ SOLN
1000.0000 ug | INTRAMUSCULAR | Status: DC
  Administered 2020-05-25: 1000 ug via INTRAMUSCULAR

## 2020-05-25 NOTE — Progress Notes (Signed)
Pt here for monthly B12 injection per Ronnald Ramp  B12 106mg given left deltoid IM, and pt tolerated injection well.  Next B12 injection scheduled for 06/01/20.   Pt here for 1st Hep B ( Heplisav)  injection per [place] Hep B given right deltoid IM, and pt tolerated injection well.  Hep B injection scheduled for 07/29/20.

## 2020-05-30 NOTE — Addendum Note (Signed)
Addended by: [name] on: 05/30/2020 12:45 PM   Modules accepted: Orders

## 2020-05-31 ENCOUNTER — Telehealth: Payer: Self-pay | Admitting: Internal Medicine

## 2020-05-31 ENCOUNTER — Encounter: Payer: Self-pay | Admitting: *Deleted

## 2020-05-31 NOTE — Telephone Encounter (Signed)
Colton Guzman w/ [facility] called and is requesting a call back in regards to an order that was placed for an ultrasound.    Phone: [phone] Ext [phone]

## 2020-06-01 NOTE — Telephone Encounter (Signed)
Attempted to call [place] but I could not leave a message. VM full.

## 2020-06-10 ENCOUNTER — Other Ambulatory Visit: Payer: Self-pay | Admitting: Physician Assistant

## 2020-06-13 ENCOUNTER — Other Ambulatory Visit: Payer: Self-pay | Admitting: *Deleted

## 2020-06-13 ENCOUNTER — Other Ambulatory Visit: Payer: Self-pay | Admitting: Internal Medicine

## 2020-06-13 ENCOUNTER — Other Ambulatory Visit: Payer: Self-pay | Admitting: Physician Assistant

## 2020-06-13 DIAGNOSIS — M1A09X0 Idiopathic chronic gout, multiple sites, without tophus (tophi): Secondary | ICD-10-CM

## 2020-06-13 MED ORDER — APIXABAN 5 MG PO TABS: 5.0000 mg | ORAL_TABLET | Freq: Two times a day (BID) | ORAL | 5 refills | Status: DC

## 2020-06-13 NOTE — Telephone Encounter (Signed)
Prescription refill request for Eliquis received. Indication: Afib Last office visit: 05/17/2020, [place] Scr: 0.75, 05/24/2020 Age: 60 yo  Weight: 82.6 kg   Pt is on the correct dose of Eliquis per dosing criteria, prescription refill sent for Eliquis '5mg'$  BID.

## 2020-06-15 ENCOUNTER — Ambulatory Visit
Admission: RE | Admit: 2020-06-15 | Discharge: 2020-06-15 | Disposition: A | Discharge status: Home / Self Care | Payer: Managed Care, Other (non HMO) | Source: Ambulatory Visit | Attending: Internal Medicine | Admitting: Internal Medicine

## 2020-06-15 ENCOUNTER — Other Ambulatory Visit: Payer: Self-pay

## 2020-06-15 DIAGNOSIS — R1900 Intra-abdominal and pelvic swelling, mass and lump, unspecified site: Secondary | ICD-10-CM

## 2020-06-17 ENCOUNTER — Other Ambulatory Visit: Payer: Self-pay | Admitting: Internal Medicine

## 2020-06-17 DIAGNOSIS — K4090 Unilateral inguinal hernia, without obstruction or gangrene, not specified as recurrent: Secondary | ICD-10-CM | POA: Insufficient documentation

## 2020-07-05 ENCOUNTER — Other Ambulatory Visit: Payer: Self-pay | Admitting: Physician Assistant

## 2020-07-05 LAB — HM DIABETES EYE EXAM

## 2020-07-08 ENCOUNTER — Other Ambulatory Visit: Payer: Self-pay | Admitting: Cardiology

## 2020-07-08 MED ORDER — ISOSORBIDE MONONITRATE ER 30 MG PO TB24: 30.0000 mg | ORAL_TABLET | Freq: Every day | ORAL | 3 refills | Status: DC

## 2020-07-08 NOTE — Telephone Encounter (Signed)
Spoke with the pt and endorsed to him recommendations per Dr. Johney Frame, as indicated in [place] to the pt.  Informed him that Dr. Johney Frame did call in Imdur 30 mg po daily to his pharmacy on file.  He is aware of common side effects with this medication, and educated on how to take this.  Advised him to continue pain meds as needed. ED precautions provided to him if pain does not go away with resting, as indicated by Dr  Johney Frame in [place] message.  He is aware that Dr. Johney Frame responded to him in [place].  Advised him to keep [place] updated on his symptoms.  Pt verbalized understanding and agrees with this plan.  Pt was more than gracious for all the assistance provided.

## 2020-07-08 NOTE — Telephone Encounter (Signed)
Called the pt about mychart message sent.  Pt states he gets sharp chest pain where his incision is (S/P CABG 03/08/20)  when he lifts, pushes, or pulls heavy objects.   Pt states he still has numbness at his incision site, which has been there since surgery.  Pt states he was pushing down on an old hand brake in his truck today, which requires force.  He states this caused sharp chest pain mid-sternal, where his incision is.  He states he was not sob during this episode. Pt states his chest pain stayed at his incision site and was relieved when he went indoors to relax and took his tramadol and tylenol.  Pt states his chest pain did not radiate when it occurred.  He denies DOE.  He denies palpitations, dizziness, N/V, pre-syncopal or syncopal episodes.  He states his BP and HR are doing well, with last reading today at 132/76 HR-87.   He said pain only occurs when lifting, pulling, and pushing heavy objects.   He is asking Dr. Johney Frame if he should be concerned about this.  He states he is aware he is more than likely over-doing it, when lifts, pulls, or pushes heavy items.  Pt states he is taking all his prescribed cardiac meds, and pain meds do relieve his chest pain.  He also adds he uses a heating pad as needed at his incision site, especially when he aggravates the area.  Pt states he is in no acute distress at this time.  Advised the pt to refrain from lifting, pulling, or pushing any heavy objects, for this seems to cause his chest pain, and sounds very musculoskeletal in nature, being this is relieved with tramadol.  Advised him to use his pain meds as needed and continue his other cardiac meds as prescribed.  Advised him to continue monitoring his BP/HR.  ER precautions provided to the pt if symptoms were to worsen of persist.    Informed the pt that I will route his concerns to Dr. Johney Frame to further review and advise on this matter, andfollow-up with him accordingly thereafter.  Pt  verbalized understanding and agrees with this plan. Pt was more than gracious for all the assistance provided.    Off note:  Also advised the pt that mychart messaging is for non-urgent questions, and if he is ever having active cardiac complaints, he should always call the office for further assistance.  Pt verbalized understanding and agrees with this plan.

## 2020-07-08 NOTE — Telephone Encounter (Signed)
Freada Bergeron, MD  [place], Freddrick March. 39 minutes ago (5:24 PM)   HP   Hello [name],  It is not uncommon to have continued discomfort at the incision site which can lead to a stabbing pain when you push the brake in your car. Taking tylenol/tramadol is appropriate for that type of pain.  The pressure sensation you are having when you carry heavy loads and exert yourself is more likely to be related to the heart. This can continue to occur even after bypass surgery as there are some regions of the heart that are supplied by your native heart arteries that continue to have diminished blood flow. These regions tend to be small and not covered by the bypass grafts. We manage this with medications like nitro or imdur to help keep the blood vessels open. This pain should go away with resting. If it does not, that is a reason to go to the ER to be evaluated.  I will send you a script for imdur to see if it helps with these symptoms. The side-effects of imdur are headaches which usually go away after a couple of days. Let me know if they persist and we can change you to an alternative medication.  Keep me posted on your symptoms and thank you for keeping me updated!

## 2020-07-11 ENCOUNTER — Other Ambulatory Visit: Payer: Self-pay | Admitting: Physician Assistant

## 2020-07-13 ENCOUNTER — Other Ambulatory Visit: Payer: Self-pay | Admitting: Physician Assistant

## 2020-07-14 ENCOUNTER — Other Ambulatory Visit: Payer: Self-pay | Admitting: Physician Assistant

## 2020-07-25 ENCOUNTER — Other Ambulatory Visit: Payer: Self-pay

## 2020-07-25 ENCOUNTER — Ambulatory Visit (INDEPENDENT_AMBULATORY_CARE_PROVIDER_SITE_OTHER): Payer: Managed Care, Other (non HMO)

## 2020-07-25 DIAGNOSIS — Z23 Encounter for immunization: Secondary | ICD-10-CM | POA: Diagnosis not present

## 2020-07-25 NOTE — Progress Notes (Addendum)
Patient here for 2nd Hep B (Helplisav) injection per Dr. Ronnald Ramp.  Hep B given in right deltoid and patient tolerated injection well  Hep B injection scheduled 09/29/20.  I have reviewed and agree

## 2020-07-26 ENCOUNTER — Encounter: Payer: Self-pay | Admitting: Internal Medicine

## 2020-07-26 ENCOUNTER — Ambulatory Visit (INDEPENDENT_AMBULATORY_CARE_PROVIDER_SITE_OTHER): Payer: Managed Care, Other (non HMO) | Admitting: Internal Medicine

## 2020-07-26 ENCOUNTER — Ambulatory Visit (INDEPENDENT_AMBULATORY_CARE_PROVIDER_SITE_OTHER): Payer: Managed Care, Other (non HMO)

## 2020-07-26 ENCOUNTER — Other Ambulatory Visit: Payer: Self-pay

## 2020-07-26 VITALS — BP 114/70 | HR 85 | Temp 98.3°F | Ht 68.5 in | Wt 180.0 lb

## 2020-07-26 DIAGNOSIS — I10 Essential (primary) hypertension: Secondary | ICD-10-CM | POA: Diagnosis not present

## 2020-07-26 DIAGNOSIS — D62 Acute posthemorrhagic anemia: Secondary | ICD-10-CM | POA: Diagnosis not present

## 2020-07-26 DIAGNOSIS — D510 Vitamin B12 deficiency anemia due to intrinsic factor deficiency: Secondary | ICD-10-CM

## 2020-07-26 DIAGNOSIS — R0609 Other forms of dyspnea: Secondary | ICD-10-CM | POA: Insufficient documentation

## 2020-07-26 DIAGNOSIS — M25512 Pain in left shoulder: Secondary | ICD-10-CM | POA: Diagnosis not present

## 2020-07-26 DIAGNOSIS — G8929 Other chronic pain: Secondary | ICD-10-CM | POA: Diagnosis not present

## 2020-07-26 DIAGNOSIS — E785 Hyperlipidemia, unspecified: Secondary | ICD-10-CM

## 2020-07-26 DIAGNOSIS — E1159 Type 2 diabetes mellitus with other circulatory complications: Secondary | ICD-10-CM | POA: Diagnosis not present

## 2020-07-26 DIAGNOSIS — I2510 Atherosclerotic heart disease of native coronary artery without angina pectoris: Secondary | ICD-10-CM

## 2020-07-26 DIAGNOSIS — M8949 Other hypertrophic osteoarthropathy, multiple sites: Secondary | ICD-10-CM

## 2020-07-26 DIAGNOSIS — R0600 Dyspnea, unspecified: Secondary | ICD-10-CM

## 2020-07-26 DIAGNOSIS — M159 Polyosteoarthritis, unspecified: Secondary | ICD-10-CM

## 2020-07-26 LAB — BASIC METABOLIC PANEL
BUN: 13 mg/dL (ref 6–23)
CO2: 26 mEq/L (ref 19–32)
Calcium: 10.4 mg/dL (ref 8.4–10.5)
Chloride: 98 mEq/L (ref 96–112)
Creatinine, Ser: 0.81 mg/dL (ref 0.40–1.50)
GFR: 97.31 mL/min (ref >60.00)
Glucose, Bld: 170 mg/dL — ABNORMAL HIGH (ref 70–99)
Potassium: 4.3 mEq/L (ref 3.5–5.1)
Sodium: 138 mEq/L (ref 135–145)

## 2020-07-26 LAB — CBC WITH DIFFERENTIAL/PLATELET
Basophils Absolute: 0.1 10*3/uL (ref 0.0–0.1)
Basophils Relative: 0.6 % (ref 0.0–3.0)
Eosinophils Absolute: 0.2 10*3/uL (ref 0.0–0.7)
Eosinophils Relative: 2.2 % (ref 0.0–5.0)
HCT: 42.7 % (ref 39.0–52.0)
Hemoglobin: 14 g/dL (ref 13.0–17.0)
Lymphocytes Relative: 38.2 % (ref 12.0–46.0)
Lymphs Abs: 3.4 10*3/uL (ref 0.7–4.0)
MCHC: 32.8 g/dL (ref 30.0–36.0)
MCV: 85.6 fl (ref 78.0–100.0)
Monocytes Absolute: 0.5 10*3/uL (ref 0.1–1.0)
Monocytes Relative: 6 % (ref 3.0–12.0)
Neutro Abs: 4.7 10*3/uL (ref 1.4–7.7)
Neutrophils Relative %: 53 % (ref 43.0–77.0)
Platelets: 203 10*3/uL (ref 150.0–400.0)
RBC: 4.99 Mil/uL (ref 4.22–5.81)
RDW: 18.4 % — ABNORMAL HIGH (ref 11.5–15.5)
WBC: 8.8 10*3/uL (ref 4.0–10.5)

## 2020-07-26 LAB — LIPID PANEL
Cholesterol: 153 mg/dL (ref 0–200)
HDL: 35.2 mg/dL — ABNORMAL LOW (ref >39.00)
NonHDL: 117.44
Total CHOL/HDL Ratio: 4
Triglycerides: 268 mg/dL — ABNORMAL HIGH (ref 0.0–149.0)
VLDL: 53.6 mg/dL — ABNORMAL HIGH (ref 0.0–40.0)

## 2020-07-26 LAB — LDL CHOLESTEROL, DIRECT: Direct LDL: 93 mg/dL

## 2020-07-26 LAB — BRAIN NATRIURETIC PEPTIDE: Pro B Natriuretic peptide (BNP): 37 pg/mL (ref 0.0–100.0)

## 2020-07-26 LAB — HEMOGLOBIN A1C: Hgb A1c MFr Bld: 8.3 % — ABNORMAL HIGH (ref 4.6–6.5)

## 2020-07-26 LAB — TROPONIN I (HIGH SENSITIVITY): High Sens Troponin I: 7 ng/L (ref 2–17)

## 2020-07-26 MED ORDER — TRAMADOL HCL 50 MG PO TABS: 50.0000 mg | ORAL_TABLET | Freq: Four times a day (QID) | ORAL | 3 refills | Status: AC | PRN

## 2020-07-26 MED ORDER — RYBELSUS 3 MG PO TABS
3.0000 mg | ORAL_TABLET | Freq: Every day | ORAL | 0 refills | Status: DC
Start: 2020-07-26 — End: 2020-08-25

## 2020-07-26 MED ORDER — METFORMIN HCL 1000 MG PO TABS: 1000.0000 mg | ORAL_TABLET | Freq: Two times a day (BID) | ORAL | 1 refills | Status: DC

## 2020-07-26 NOTE — Patient Instructions (Signed)
Vitamin B12 Deficiency Vitamin B12 deficiency occurs when the body does not have enough vitamin B12, which is an important vitamin. The body needs this vitamin:  To make red blood cells.  To make DNA. This is the genetic material inside cells.  To help the nerves work properly so they can carry messages from the brain to the body. Vitamin B12 deficiency can cause various health problems, such as a low red blood cell count (anemia) or nerve damage. What are the causes? This condition may be caused by:  Not eating enough foods that contain vitamin B12.  Not having enough stomach acid and digestive fluids to properly absorb vitamin B12 from the food that you eat.  Certain digestive system diseases that make it hard to absorb vitamin B12. These diseases include Crohn's disease, chronic pancreatitis, and cystic fibrosis.  A condition in which the body does not make enough of a protein (intrinsic factor), resulting in too few red blood cells (pernicious anemia).  Having a surgery in which part of the stomach or small intestine is removed.  Taking certain medicines that make it hard for the body to absorb vitamin B12. These medicines include: ? Heartburn medicines (antacids and proton pump inhibitors). ? Certain antibiotic medicines. ? Some medicines that are used to treat diabetes, tuberculosis, gout, or high cholesterol. What increases the risk? The following factors may make you more likely to develop a B12 deficiency:  Being older than age 50.  Eating a vegetarian or vegan diet, especially while you are pregnant.  Eating a poor diet while you are pregnant.  Taking certain medicines.  Having alcoholism. What are the signs or symptoms? In some cases, there are no symptoms of this condition. If the condition leads to anemia or nerve damage, various symptoms can occur, such as:  Weakness.  Fatigue.  Loss of appetite.  Weight loss.  Numbness or tingling in your hands and  feet.  Redness and burning of the tongue.  Confusion or memory problems.  Depression.  Sensory problems, such as color blindness, ringing in the ears, or loss of taste.  Diarrhea or constipation.  Trouble walking. If anemia is severe, symptoms can include:  Shortness of breath.  Dizziness.  Rapid heart rate (tachycardia). How is this diagnosed? This condition may be diagnosed with a blood test to measure the level of vitamin B12 in your blood. You may also have other tests, including:  A group of tests that measure certain characteristics of blood cells (complete blood count, CBC).  A blood test to measure intrinsic factor.  A procedure where a thin tube with a camera on the end is used to look into your stomach or intestines (endoscopy). Other tests may be needed to discover the cause of B12 deficiency. How is this treated? Treatment for this condition depends on the cause. This condition may be treated by:  Changing your eating and drinking habits, such as: ? Eating more foods that contain vitamin B12. ? Drinking less alcohol or no alcohol.  Getting vitamin B12 injections.  Taking vitamin B12 supplements. Your health care provider will tell you which dosage is best for you. Follow these instructions at home: Eating and drinking  Eat lots of healthy foods that contain vitamin B12, including: ? Meats and poultry. This includes beef, pork, chicken, turkey, and organ meats, such as liver. ? Seafood. This includes clams, rainbow trout, salmon, tuna, and haddock. ? Eggs. ? Cereal and dairy products that are fortified. This means that vitamin B12 has   been added to the food. Check the label on the package to see if the food is fortified. The items listed above may not be a complete list of recommended foods and beverages. Contact a dietitian for more information.   General instructions  Get any injections that are prescribed by your health care provider.  Take  supplements only as told by your health care provider. Follow the directions carefully.  Do not drink alcohol if your health care provider tells you not to. In some cases, you may only be asked to limit alcohol use.  Keep all follow-up visits as told by your health care provider. This is important. Contact a health care provider if:  Your symptoms come back. Get help right away if you:  Develop shortness of breath.  Have a rapid heart rate.  Have chest pain.  Become dizzy or lose consciousness. Summary  Vitamin B12 deficiency occurs when the body does not have enough vitamin B12.  The main causes of vitamin B12 deficiency include dietary deficiency, digestive diseases, pernicious anemia, and having a surgery in which part of the stomach or small intestine is removed.  In some cases, there are no symptoms of this condition. If the condition leads to anemia or nerve damage, various symptoms can occur, such as weakness, shortness of breath, and numbness.  Treatment may include getting vitamin B12 injections or taking vitamin B12 supplements. Eat lots of healthy foods that contain vitamin B12. This information is not intended to replace advice given to you by your health care provider. Make sure you discuss any questions you have with your health care provider. Document Revised: 07/25/2018 Document Reviewed: 10/15/2017 Elsevier Patient Education  2021 Elsevier Inc.  

## 2020-07-26 NOTE — Progress Notes (Signed)
Subjective:  Patient ID: Colton Guzman., male    DOB: 09/26/1960  Age: 60 y.o. MRN: [id number]  CC: Shoulder Pain (Patient c/o having left shoulder pain daily and has difficulty lifting arm), Osteoarthritis, Anemia, Diabetes, and Hypertension  This visit occurred during the SARS-CoV-2 public health emergency.  Safety protocols were in place, including screening questions prior to the visit, additional usage of staff PPE, and extensive cleaning of exam room while observing appropriate contact time as indicated for disinfecting solutions.    HPI [facility] [employer]. presents for f/up -   He complains of chronic, worsening left shoulder pain and decreased range of motion.  He denies any recent trauma or injury.  He has been getting symptom relief with tramadol and requests a refill.  About 2 weeks ago he did some lifting and felt sharp pain across the front of his chest.  He and his wife tell me that they ran this by his cardiologist and he was told that it was musculoskeletal pain but his wife is still concerned that he may have had another heart attack.  He is also had mild DOE since his heart attack but he says that is getting better.  He denies diaphoresis, palpitations, dizziness, lightheadedness, or edema.  Outpatient Medications Prior to Visit  Medication Sig Dispense Refill   allopurinol (ZYLOPRIM) 100 MG tablet TAKE 1 TABLET BY MOUTH EVERY DAY 90 tablet 0   amLODipine (NORVASC) 10 MG tablet Take 10 mg by mouth daily.     apixaban (ELIQUIS) 5 MG TABS tablet Take 1 tablet (5 mg total) by mouth 2 (two) times daily. 60 tablet 5   Ascorbic Acid (VITAMIN C) 1000 MG tablet Take 1,000 mg by mouth daily.     aspirin EC 81 MG tablet Take 1 tablet (81 mg total) by mouth daily. Swallow whole. 150 tablet 2   dapagliflozin propanediol (FARXIGA) 10 MG TABS tablet Take 1 tablet (10 mg total) by mouth daily before breakfast. 90 tablet 1   Glucagon (GVOKE HYPOPEN 2-PACK) 1 MG/0.2ML SOAJ  Inject 1 Act into the skin daily as needed. 2 mL 5   isosorbide mononitrate (IMDUR) 30 MG 24 hr tablet Take 1 tablet (30 mg total) by mouth daily. 90 tablet 3   losartan (COZAAR) 50 MG tablet Take 1 tablet (50 mg total) by mouth daily. 90 tablet 3   Magnesium 300 MG CAPS Take 300 mg by mouth daily.     metoprolol succinate (TOPROL XL) 25 MG 24 hr tablet Take 1 tablet (25 mg total) by mouth daily. 30 tablet 11   Multiple Vitamin (MULTIVITAMIN WITH MINERALS) TABS tablet Take 1 tablet by mouth daily.     Vitamin D, Ergocalciferol, (DRISDOL) 1.25 MG (50000 UNIT) CAPS capsule Take 50,000 Units by mouth every 7 (seven) days.     atorvastatin (LIPITOR) 80 MG tablet Take 1 tablet (80 mg total) by mouth daily. 30 tablet 2   metFORMIN (GLUCOPHAGE) 1000 MG tablet Take 1,000 mg by mouth 2 (two) times daily with a meal.     traMADol (ULTRAM) 50 MG tablet Take 1 tablet (50 mg total) by mouth every 6 (six) hours as needed. 90 tablet 3   cyanocobalamin ((VITAMIN B-12)) injection 1,000 mcg      No facility-administered medications prior to visit.    ROS Review of Systems  Constitutional:  Negative for appetite change and fatigue.  HENT: Negative.    Respiratory:  Positive for shortness of breath. Negative for cough and  chest tightness.   Cardiovascular:  Positive for chest pain. Negative for palpitations and leg swelling.  Gastrointestinal:  Negative for abdominal pain, constipation, diarrhea, nausea and vomiting.  Endocrine: Negative for polydipsia, polyphagia and polyuria.  Genitourinary: Negative.   Musculoskeletal:  Positive for arthralgias. Negative for myalgias.  Skin: Negative.   Neurological: Negative.  Negative for dizziness, weakness and headaches.  Hematological:  Negative for adenopathy. Does not bruise/bleed easily.  Psychiatric/Behavioral: Negative.     Objective:  BP 114/70 (BP Location: Left Arm, Patient Position: Sitting, Cuff Size: Large)   Pulse 85   Temp 98.3 F (36.8 C) (Oral)    Ht 5' 8.5" (1.74 m)   Wt 180 lb (81.6 kg)   SpO2 97%   BMI 26.97 kg/m   BP Readings from Last 3 Encounters:  07/26/20 114/70  05/18/20 126/84  05/17/20 (!) 142/90    Wt Readings from Last 3 Encounters:  07/26/20 180 lb (81.6 kg)  05/18/20 182 lb (82.6 kg)  05/17/20 179 lb (81.2 kg)    Physical Exam Vitals reviewed.  Constitutional:      Appearance: Normal appearance.  HENT:     Nose: Nose normal.     Mouth/Throat:     Mouth: Mucous membranes are moist.  Eyes:     General: No scleral icterus.    Conjunctiva/sclera: Conjunctivae normal.  Cardiovascular:     Rate and Rhythm: Normal rate and regular rhythm.     Heart sounds: No murmur heard.   No friction rub. No gallop.     Comments: EKG - NSR, 84 bpm LAE, Q waves in V1 V2 are old No changes Pulmonary:     Effort: Pulmonary effort is normal.     Breath sounds: No stridor. No wheezing, rhonchi or rales.  Abdominal:     General: Abdomen is flat. Bowel sounds are normal. There is no distension.     Palpations: Abdomen is soft. There is no hepatomegaly, splenomegaly or mass.     Tenderness: There is no abdominal tenderness. There is no guarding.  Musculoskeletal:        General: Deformity (djd) present.     Right shoulder: Normal.     Left shoulder: No swelling, deformity, tenderness or bony tenderness. Decreased range of motion.     Cervical back: Neck supple.     Right lower leg: No edema.     Left lower leg: No edema.  Lymphadenopathy:     Cervical: No cervical adenopathy.  Neurological:     General: No focal deficit present.     Mental Status: He is alert.  Psychiatric:        Mood and Affect: Mood normal.        Behavior: Behavior normal.    Lab Results  Component Value Date   WBC 8.8 07/26/2020   HGB 14.0 07/26/2020   HCT 42.7 07/26/2020   PLT 203.0 07/26/2020   GLUCOSE 170 (H) 07/26/2020   CHOL 153 07/26/2020   TRIG 268.0 (H) 07/26/2020   HDL 35.20 (L) 07/26/2020   LDLDIRECT 93.0 07/26/2020    LDLCALC 73 12/21/2019   ALT 16 05/18/2020   AST 16 05/18/2020   NA 138 07/26/2020   K 4.3 07/26/2020   CL 98 07/26/2020   CREATININE 0.81 07/26/2020   BUN 13 07/26/2020   CO2 26 07/26/2020   TSH 1.033 02/23/2020   PSA 0.2 12/21/2019   INR 1.2 (H) 05/18/2020   HGBA1C 8.3 (H) 07/26/2020   MICROALBUR 387 12/21/2019  US PELVIS LIMITED (TRANSABDOMINAL ONLY)  Result Date: 06/17/2020 CLINICAL DATA:  Left groin mass with prominent left pelvic swelling. EXAM: LIMITED ULTRASOUND OF PELVIS TECHNIQUE: Limited transabdominal ultrasound examination of the pelvis was performed. COMPARISON:  None. FINDINGS: There is a large left inguinal hernia containing fat and bowel loops, with peristalsis of bowel loop seen within the left inguinal hernia sac on cine clips. No mass, fluid collection or adenopathy. IMPRESSION: Large bowel-containing left inguinal hernia, as also seen on 02/22/2020 CT angiogram study of the abdomen and pelvis (where a large portion of the sigmoid colon was seen to extend into the left inguinal hernia sac). Electronically Signed   By: Ilona Sorrel M.D.   On: 06/17/2020 09:29   DG Shoulder Left  Result Date: 07/28/2020 CLINICAL DATA:  Chronic left shoulder pain EXAM: LEFT SHOULDER - 2+ VIEW COMPARISON:  None. FINDINGS: There is no evidence of fracture or dislocation. Mild osteoarthritis of the glenohumeral and acromioclavicular joints. No soft tissue swelling. Median sternotomy wires are noted. IMPRESSION: Mild osteoarthritis of the left shoulder.  No acute findings. Electronically Signed   By: Davina Poke D.O.   On: 07/28/2020 10:37      Assessment & Plan:   Dontae was seen today for shoulder pain, osteoarthritis, anemia, diabetes and hypertension.  Diagnoses and all orders for this visit:  Primary hypertension- His blood pressure is adequately well controlled. -     Basic metabolic panel; Future -     Basic metabolic panel  Acute blood loss anemia  Dyslipidemia, goal  LDL below 70- He has not achieved his LDL goal.  Will restart the statin. -     Lipid panel; Future -     Lipid panel -     atorvastatin (LIPITOR) 80 MG tablet; Take 1 tablet (80 mg total) by mouth daily.  Type 2 diabetes mellitus with other circulatory complication, without long-term current use of insulin ([place])- His blood sugar is not adequately well controlled.  Will restart metformin and will add a GLP-1 agonist to the SGLT2 inhibitor. -     Hemoglobin A1c; Future -     Hemoglobin A1c -     metFORMIN (GLUCOPHAGE) 1000 MG tablet; Take 1 tablet (1,000 mg total) by mouth 2 (two) times daily with a meal. -     Semaglutide (RYBELSUS) 3 MG TABS; Take 3 mg by mouth daily.  Chronic left shoulder pain -     DG Shoulder Left; Future -     Ambulatory referral to Sports Medicine  Vitamin B12 deficiency anemia due to intrinsic factor deficiency- His H&H are normal now.  Will continue parenteral B12 replacement therapy. -     CBC with Differential/Platelet; Future -     CBC with Differential/Platelet  DOE (dyspnea on exertion)- Based on his symptoms, EKG, labs there is no evidence of recurrent ischemia. -     Troponin I (High Sensitivity); Future -     Brain natriuretic peptide; Future -     Brain natriuretic peptide -     Troponin I (High Sensitivity) -     EKG 12-Lead  Primary osteoarthritis involving multiple joints -     traMADol (ULTRAM) 50 MG tablet; Take 1 tablet (50 mg total) by mouth every 6 (six) hours as needed. -     Ambulatory referral to Sports Medicine  Coronary artery disease involving native coronary artery of native heart without angina pectoris -     atorvastatin (LIPITOR) 80 MG tablet; Take 1 tablet (80  mg total) by mouth daily.  Other orders -     LDL cholesterol, direct  I have changed Ronne B. Kurihara Jr.'s metFORMIN. I am also having him start on Rybelsus. Additionally, I am having him maintain his vitamin C, Vitamin D (Ergocalciferol), Magnesium, multivitamin with  minerals, metoprolol succinate, aspirin EC, amLODipine, losartan, dapagliflozin propanediol, Gvoke HypoPen 2-Pack, allopurinol, apixaban, isosorbide mononitrate, traMADol, and atorvastatin. We will stop administering cyanocobalamin.  Meds ordered this encounter  Medications   metFORMIN (GLUCOPHAGE) 1000 MG tablet    Sig: Take 1 tablet (1,000 mg total) by mouth 2 (two) times daily with a meal.    Dispense:  180 tablet    Refill:  1   Semaglutide (RYBELSUS) 3 MG TABS    Sig: Take 3 mg by mouth daily.    Dispense:  30 tablet    Refill:  0   traMADol (ULTRAM) 50 MG tablet    Sig: Take 1 tablet (50 mg total) by mouth every 6 (six) hours as needed.    Dispense:  90 tablet    Refill:  3   atorvastatin (LIPITOR) 80 MG tablet    Sig: Take 1 tablet (80 mg total) by mouth daily.    Dispense:  90 tablet    Refill:  1   I spent 50 minutes in preparing to see the patient by review of recent labs and images, obtaining and reviewing separately obtained history, communicating with the patient and his wife, ordering medications, an EKG and labs, and documenting clinical information in the EHR including the differential Dx, treatment, and any further evaluation and other management of 1. Primary hypertension 2. Dyslipidemia, goal LDL below 70 3. Type 2 diabetes mellitus with other circulatory complication, without long-term current use of insulin ([place]) 4. Chronic left shoulder pain 5. Vitamin B12 deficiency anemia due to intrinsic factor deficiency 6. DOE (dyspnea on exertion) 7. Primary osteoarthritis involving multiple joints 8. Coronary artery disease involving native coronary artery of native heart without angina pectoris       Follow-up: Return in about 6 months (around 01/25/2021).  Scarlette Calico, MD

## 2020-07-28 MED ORDER — ATORVASTATIN CALCIUM 80 MG PO TABS: 80.0000 mg | ORAL_TABLET | Freq: Every day | ORAL | 1 refills | Status: DC

## 2020-08-03 ENCOUNTER — Ambulatory Visit (INDEPENDENT_AMBULATORY_CARE_PROVIDER_SITE_OTHER): Payer: Managed Care, Other (non HMO) | Admitting: Family Medicine

## 2020-08-03 ENCOUNTER — Other Ambulatory Visit: Payer: Self-pay

## 2020-08-03 ENCOUNTER — Ambulatory Visit: Payer: Self-pay

## 2020-08-03 ENCOUNTER — Encounter: Payer: Self-pay | Admitting: Family Medicine

## 2020-08-03 VITALS — BP 126/78 | Ht 68.0 in | Wt 180.0 lb

## 2020-08-03 DIAGNOSIS — E11618 Type 2 diabetes mellitus with other diabetic arthropathy: Secondary | ICD-10-CM

## 2020-08-03 DIAGNOSIS — M7502 Adhesive capsulitis of left shoulder: Secondary | ICD-10-CM | POA: Diagnosis not present

## 2020-08-03 DIAGNOSIS — M25512 Pain in left shoulder: Secondary | ICD-10-CM

## 2020-08-03 NOTE — Patient Instructions (Signed)
Thank you for coming in to see [place] today!  You have adhesive capsulitis.  Please see below to review our plan for today's visit:   1.  Please start doing the stretching exercises that were shown to you today. 2.  Please plan to go to physical therapy that was set up for you today. 3.  Lets plan to follow-up in 4 to 6 weeks, if at that time you are continuing to have tightness, we may consider a steroid injection into your shoulder.   Please call the clinic at [phone] if your symptoms worsen or you have any concerns. It was our pleasure to serve you.       Dr. Dagoberto Ligas [facility]

## 2020-08-03 NOTE — Progress Notes (Signed)
PCP: [name], MD  Subjective:   HPI: Patient is a 60 y.o. male with history of type 2 diabetes and CAD here for evaluation of left shoulder pain and stiffness.  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  It was not very bothersome at first but over the past 6 months or so has become increasingly stiff and difficult to use.  He has pain and is unable to raise his arm above his head and also has pain and stiffness with rotational maneuvers.  He is not aware of any specific incident that preceded his symptoms.  No numbness or tingling or weakness, no neck pain.  He has been taking Tylenol for the pain which has not been significantly helpful.  Review of Systems:  Per HPI.   [place], medications and smoking status reviewed.      Objective:  Physical Exam:  No flowsheet data found.   Gen: awake, alert, NAD, comfortable in exam room Pulm: breathing unlabored  Left shoulder: -Inspection: no obvious deformity, atrophy, or asymmetry. No bruising. No swelling -Palpation: no TTP over [facility] joint or bicipital groove. -ROM: Very limited range of motion with abduction to approximately 90 degrees, very limited external rotation approximately 10 degrees versus 60-70 on the contralateral side.  Mildly decreased internal rotation.  All limitations in ROM are present both actively and passively. NV intact distally Normal scapular function observed. Special Tests:  - Impingement: Mildly positive Hawkins, positive neers, positive empty can sign. - Supraspinatus: Positive empty can.  5/5 strength with resisted flexion at 20 degrees - Infraspinatus/Teres Minor: 5/5 strength with ER - Subscapularis: 5/5 strength with IR - Biceps tendon: Neg Speeds, Neg Yerrgason's  - Labrum: Negative Obriens, good stability  [place] left shoulder: -Biceps tendon: Well visualized within the bicipital groove and without any  abnormalities -Pectoralis: Insertion visualized and without abnormalities. -Subscapularis: Well visualized to insertion point on humerus.  No abnormalities.  Dynamic testing over the coracoid did not show signs of impingement. -AC joint: No osteophytes, no significant separation, negative geyser sign. -Supraspinatus: Well-visualized and without any abnormalities.  Dynamic testing did not reveal signs of impingement. -Subacromial bursa: No obvious enlargement or swelling. -Infraspinatus/teres minor: Insertion point on posterior humerus visualized and without abnormalities.  Impression: -Normal ultrasound examination of the left shoulder.     Assessment & Plan:  1.  Left shoulder adhesive capsulitis Patient with restricted range of motion both actively and passively.  He has fairly good strength of the rotator cuff and a normal-appearing ultrasound which is most supportive of adhesive capsulitis.  He does also have fairly poorly controlled diabetes with an A1c of 8.3 most recently which predisposes him to this.  We discussed the diagnosis of adhesive capsulitis in detail, and that this can be difficult problem to treat.  We did discuss also that eventually this will likely resolve regardless of what we do.  He would like to start with physical therapy which I think is a good place to start as well.  Plan: -Home exercise program focusing on range of motion given today -Referral for PT with focus on range of motion -Follow-up in 4 to 6 weeks, if lack of improvement could consider corticosteroid injection, though would be thoughtful about this given his diabetes and elevated A1c.   Dagoberto Ligas, MD [facility] Fellow 08/03/2020 2:39 PM  Addendum:  I was the preceptor for  this visit and available for immediate consultation.  Karlton Lemon MD Kirt Boys

## 2020-08-12 ENCOUNTER — Observation Stay (HOSPITAL_COMMUNITY): Payer: Managed Care, Other (non HMO)

## 2020-08-12 ENCOUNTER — Emergency Department (HOSPITAL_COMMUNITY): Payer: Managed Care, Other (non HMO)

## 2020-08-12 ENCOUNTER — Other Ambulatory Visit: Payer: Self-pay

## 2020-08-12 ENCOUNTER — Encounter (HOSPITAL_COMMUNITY): Payer: Self-pay | Admitting: Emergency Medicine

## 2020-08-12 ENCOUNTER — Inpatient Hospital Stay (HOSPITAL_COMMUNITY)
Admission: EM | Admit: 2020-08-12 | Discharge: 2020-08-18 | DRG: 378 | Disposition: A | Discharge status: Home / Self Care | Payer: Managed Care, Other (non HMO) | Attending: Internal Medicine | Admitting: Internal Medicine

## 2020-08-12 DIAGNOSIS — R55 Syncope and collapse: Secondary | ICD-10-CM | POA: Diagnosis present

## 2020-08-12 DIAGNOSIS — K254 Chronic or unspecified gastric ulcer with hemorrhage: Secondary | ICD-10-CM | POA: Diagnosis present

## 2020-08-12 DIAGNOSIS — D72829 Elevated white blood cell count, unspecified: Secondary | ICD-10-CM | POA: Diagnosis present

## 2020-08-12 DIAGNOSIS — K5731 Diverticulosis of large intestine without perforation or abscess with bleeding: Secondary | ICD-10-CM | POA: Diagnosis not present

## 2020-08-12 DIAGNOSIS — E785 Hyperlipidemia, unspecified: Secondary | ICD-10-CM | POA: Diagnosis not present

## 2020-08-12 DIAGNOSIS — I10 Essential (primary) hypertension: Secondary | ICD-10-CM | POA: Diagnosis present

## 2020-08-12 DIAGNOSIS — K922 Gastrointestinal hemorrhage, unspecified: Secondary | ICD-10-CM

## 2020-08-12 DIAGNOSIS — E1159 Type 2 diabetes mellitus with other circulatory complications: Secondary | ICD-10-CM

## 2020-08-12 DIAGNOSIS — I152 Hypertension secondary to endocrine disorders: Secondary | ICD-10-CM | POA: Diagnosis present

## 2020-08-12 DIAGNOSIS — I480 Paroxysmal atrial fibrillation: Secondary | ICD-10-CM | POA: Diagnosis present

## 2020-08-12 DIAGNOSIS — E1165 Type 2 diabetes mellitus with hyperglycemia: Secondary | ICD-10-CM | POA: Diagnosis present

## 2020-08-12 DIAGNOSIS — E1169 Type 2 diabetes mellitus with other specified complication: Secondary | ICD-10-CM | POA: Diagnosis present

## 2020-08-12 DIAGNOSIS — I2510 Atherosclerotic heart disease of native coronary artery without angina pectoris: Secondary | ICD-10-CM | POA: Diagnosis present

## 2020-08-12 DIAGNOSIS — I252 Old myocardial infarction: Secondary | ICD-10-CM

## 2020-08-12 DIAGNOSIS — E669 Obesity, unspecified: Secondary | ICD-10-CM | POA: Diagnosis present

## 2020-08-12 DIAGNOSIS — R000 Tachycardia, unspecified: Secondary | ICD-10-CM | POA: Diagnosis not present

## 2020-08-12 DIAGNOSIS — K2900 Acute gastritis without bleeding: Secondary | ICD-10-CM

## 2020-08-12 DIAGNOSIS — Z7901 Long term (current) use of anticoagulants: Secondary | ICD-10-CM

## 2020-08-12 DIAGNOSIS — E876 Hypokalemia: Secondary | ICD-10-CM | POA: Diagnosis present

## 2020-08-12 DIAGNOSIS — Z6826 Body mass index (BMI) 26.0-26.9, adult: Secondary | ICD-10-CM

## 2020-08-12 DIAGNOSIS — Z951 Presence of aortocoronary bypass graft: Secondary | ICD-10-CM

## 2020-08-12 DIAGNOSIS — Z7982 Long term (current) use of aspirin: Secondary | ICD-10-CM

## 2020-08-12 DIAGNOSIS — Z7984 Long term (current) use of oral hypoglycemic drugs: Secondary | ICD-10-CM

## 2020-08-12 DIAGNOSIS — Z20822 Contact with and (suspected) exposure to covid-19: Secondary | ICD-10-CM | POA: Diagnosis present

## 2020-08-12 DIAGNOSIS — M1990 Unspecified osteoarthritis, unspecified site: Secondary | ICD-10-CM | POA: Diagnosis present

## 2020-08-12 DIAGNOSIS — D62 Acute posthemorrhagic anemia: Secondary | ICD-10-CM | POA: Diagnosis not present

## 2020-08-12 DIAGNOSIS — Z8674 Personal history of sudden cardiac arrest: Secondary | ICD-10-CM

## 2020-08-12 DIAGNOSIS — E119 Type 2 diabetes mellitus without complications: Secondary | ICD-10-CM

## 2020-08-12 DIAGNOSIS — Z79899 Other long term (current) drug therapy: Secondary | ICD-10-CM

## 2020-08-12 DIAGNOSIS — K4090 Unilateral inguinal hernia, without obstruction or gangrene, not specified as recurrent: Secondary | ICD-10-CM | POA: Diagnosis present

## 2020-08-12 HISTORY — DX: Paroxysmal atrial fibrillation: I48.0

## 2020-08-12 HISTORY — DX: Hyperlipidemia, unspecified: E78.5

## 2020-08-12 HISTORY — DX: Gastrointestinal hemorrhage, unspecified: K92.2

## 2020-08-12 LAB — CBC
HCT: 40.5 % (ref 39.0–52.0)
HCT: 30.7 % — ABNORMAL LOW (ref 39.0–52.0)
Hemoglobin: 13.2 g/dL (ref 13.0–17.0)
Hemoglobin: 9.8 g/dL — ABNORMAL LOW (ref 13.0–17.0)
MCH: 29.3 pg (ref 26.0–34.0)
MCH: 29.3 pg (ref 26.0–34.0)
MCHC: 32.6 g/dL (ref 30.0–36.0)
MCHC: 31.9 g/dL (ref 30.0–36.0)
MCV: 89.8 fL (ref 80.0–100.0)
MCV: 91.6 fL (ref 80.0–100.0)
Platelets: 181 10*3/uL (ref 150–400)
Platelets: 182 10*3/uL (ref 150–400)
RBC: 4.51 MIL/uL (ref 4.22–5.81)
RBC: 3.35 MIL/uL — ABNORMAL LOW (ref 4.22–5.81)
RDW: 15.5 % (ref 11.5–15.5)
RDW: 15.7 % — ABNORMAL HIGH (ref 11.5–15.5)
WBC: 15.3 10*3/uL — ABNORMAL HIGH (ref 4.0–10.5)
WBC: 13.3 10*3/uL — ABNORMAL HIGH (ref 4.0–10.5)
nRBC: 0 % (ref 0.0–0.2)
nRBC: 0 % (ref 0.0–0.2)

## 2020-08-12 LAB — COMPREHENSIVE METABOLIC PANEL
ALT: 27 U/L (ref 0–44)
AST: 22 U/L (ref 15–41)
Albumin: 4.1 g/dL (ref 3.5–5.0)
Alkaline Phosphatase: 70 U/L (ref 38–126)
Anion gap: 11 (ref 5–15)
BUN: 13 mg/dL (ref 6–20)
CO2: 20 mmol/L — ABNORMAL LOW (ref 22–32)
Calcium: 9 mg/dL (ref 8.9–10.3)
Chloride: 101 mmol/L (ref 98–111)
Creatinine, Ser: 0.74 mg/dL (ref 0.61–1.24)
GFR, Estimated: >60 mL/min (ref >60)
Glucose, Bld: 214 mg/dL — ABNORMAL HIGH (ref 70–99)
Potassium: 4 mmol/L (ref 3.5–5.1)
Sodium: 132 mmol/L — ABNORMAL LOW (ref 135–145)
Total Bilirubin: 1 mg/dL (ref 0.3–1.2)
Total Protein: 7.9 g/dL (ref 6.5–8.1)

## 2020-08-12 LAB — GLUCOSE, CAPILLARY
Glucose-Capillary: 245 mg/dL — ABNORMAL HIGH (ref 70–99)
Glucose-Capillary: 225 mg/dL — ABNORMAL HIGH (ref 70–99)
Glucose-Capillary: 309 mg/dL — ABNORMAL HIGH (ref 70–99)
Glucose-Capillary: 248 mg/dL — ABNORMAL HIGH (ref 70–99)

## 2020-08-12 LAB — RESP PANEL BY RT-PCR (FLU A&B, COVID) ARPGX2
Influenza A by PCR: NEGATIVE
Influenza B by PCR: NEGATIVE
SARS Coronavirus 2 by RT PCR: NEGATIVE

## 2020-08-12 LAB — HEMOGLOBIN AND HEMATOCRIT, BLOOD
HCT: 36.3 % — ABNORMAL LOW (ref 39.0–52.0)
HCT: 30.2 % — ABNORMAL LOW (ref 39.0–52.0)
HCT: 27.6 % — ABNORMAL LOW (ref 39.0–52.0)
Hemoglobin: 12 g/dL — ABNORMAL LOW (ref 13.0–17.0)
Hemoglobin: 9.8 g/dL — ABNORMAL LOW (ref 13.0–17.0)
Hemoglobin: 9.1 g/dL — ABNORMAL LOW (ref 13.0–17.0)

## 2020-08-12 LAB — PREPARE RBC (CROSSMATCH)

## 2020-08-12 LAB — CBG MONITORING, ED: Glucose-Capillary: 192 mg/dL — ABNORMAL HIGH (ref 70–99)

## 2020-08-12 MED ORDER — INSULIN ASPART 100 UNIT/ML IJ SOLN
0.0000 [IU] | Freq: Three times a day (TID) | INTRAMUSCULAR | Status: DC
  Administered 2020-08-13: 5 [IU] via SUBCUTANEOUS
  Administered 2020-08-13: 11 [IU] via SUBCUTANEOUS
  Administered 2020-08-14 (×3): 5 [IU] via SUBCUTANEOUS
  Administered 2020-08-15: 8 [IU] via SUBCUTANEOUS
  Administered 2020-08-15: 3 [IU] via SUBCUTANEOUS
  Administered 2020-08-15: 8 [IU] via SUBCUTANEOUS
  Administered 2020-08-16: 5 [IU] via SUBCUTANEOUS
  Administered 2020-08-16: 8 [IU] via SUBCUTANEOUS
  Administered 2020-08-16: 3 [IU] via SUBCUTANEOUS
  Administered 2020-08-17: 5 [IU] via SUBCUTANEOUS
  Administered 2020-08-17: 8 [IU] via SUBCUTANEOUS
  Administered 2020-08-17: 3 [IU] via SUBCUTANEOUS
  Administered 2020-08-18: 5 [IU] via SUBCUTANEOUS
  Administered 2020-08-18: 8 [IU] via SUBCUTANEOUS

## 2020-08-12 MED ORDER — ACETAMINOPHEN 650 MG RE SUPP: 650.0000 mg | Freq: Four times a day (QID) | RECTAL | Status: DC | PRN

## 2020-08-12 MED ORDER — LACTATED RINGERS IV SOLN: INTRAVENOUS | Status: DC

## 2020-08-12 MED ORDER — PANTOPRAZOLE SODIUM 40 MG IV SOLR
40.0000 mg | Freq: Once | INTRAVENOUS | Status: AC
  Administered 2020-08-12: 40 mg via INTRAVENOUS
  Filled 2020-08-12: qty 40

## 2020-08-12 MED ORDER — SODIUM CHLORIDE 0.9% FLUSH
3.0000 mL | Freq: Two times a day (BID) | INTRAVENOUS | Status: DC
  Administered 2020-08-12 – 2020-08-18 (×12): 3 mL via INTRAVENOUS

## 2020-08-12 MED ORDER — ATORVASTATIN CALCIUM 80 MG PO TABS
80.0000 mg | ORAL_TABLET | Freq: Every day | ORAL | Status: DC
  Administered 2020-08-12 – 2020-08-18 (×7): 80 mg via ORAL
  Filled 2020-08-12 (×7): qty 1

## 2020-08-12 MED ORDER — PEG-KCL-NACL-NASULF-NA ASC-C 100 G PO SOLR
0.5000 | Freq: Once | ORAL | Status: AC
  Administered 2020-08-12: 100 g via ORAL
  Filled 2020-08-12: qty 1

## 2020-08-12 MED ORDER — IOHEXOL 350 MG/ML SOLN
100.0000 mL | Freq: Once | INTRAVENOUS | Status: AC
  Administered 2020-08-12: 100 mL via INTRAVENOUS

## 2020-08-12 MED ORDER — INSULIN ASPART 100 UNIT/ML IJ SOLN: 0.0000 [IU] | Freq: Three times a day (TID) | INTRAMUSCULAR | Status: DC

## 2020-08-12 MED ORDER — ONDANSETRON HCL 4 MG/2ML IJ SOLN: 4.0000 mg | Freq: Four times a day (QID) | INTRAMUSCULAR | Status: DC | PRN

## 2020-08-12 MED ORDER — PEG-KCL-NACL-NASULF-NA ASC-C 100 G PO SOLR: 1.0000 | Freq: Once | ORAL | Status: DC

## 2020-08-12 MED ORDER — PROTHROMBIN COMPLEX CONC HUMAN 500 UNITS IV KIT
4150.0000 [IU] | PACK | Status: AC
  Administered 2020-08-12: 4150 [IU] via INTRAVENOUS
  Filled 2020-08-12: qty 1016

## 2020-08-12 MED ORDER — METOCLOPRAMIDE HCL 5 MG/ML IJ SOLN
10.0000 mg | Freq: Once | INTRAMUSCULAR | Status: AC
  Administered 2020-08-12: 10 mg via INTRAVENOUS
  Filled 2020-08-12: qty 2

## 2020-08-12 MED ORDER — ACETAMINOPHEN 325 MG PO TABS
650.0000 mg | ORAL_TABLET | Freq: Four times a day (QID) | ORAL | Status: DC | PRN
  Administered 2020-08-13: 650 mg via ORAL
  Filled 2020-08-12: qty 2

## 2020-08-12 MED ORDER — PANTOPRAZOLE SODIUM 40 MG IV SOLR
40.0000 mg | Freq: Two times a day (BID) | INTRAVENOUS | Status: DC
  Administered 2020-08-12 – 2020-08-16 (×8): 40 mg via INTRAVENOUS
  Filled 2020-08-12 (×8): qty 40

## 2020-08-12 MED ORDER — ONDANSETRON HCL 4 MG PO TABS: 4.0000 mg | ORAL_TABLET | Freq: Four times a day (QID) | ORAL | Status: DC | PRN

## 2020-08-12 MED ORDER — INSULIN ASPART 100 UNIT/ML IJ SOLN
0.0000 [IU] | Freq: Every day | INTRAMUSCULAR | Status: DC
Start: 2020-08-12 — End: 2020-08-18
  Administered 2020-08-13 – 2020-08-16 (×2): 3 [IU] via SUBCUTANEOUS

## 2020-08-12 MED ORDER — LACTATED RINGERS IV SOLN: Freq: Once | INTRAVENOUS | Status: DC

## 2020-08-12 MED ORDER — ALBUTEROL SULFATE (2.5 MG/3ML) 0.083% IN NEBU: 2.5000 mg | INHALATION_SOLUTION | Freq: Four times a day (QID) | RESPIRATORY_TRACT | Status: DC | PRN

## 2020-08-12 MED ORDER — LACTATED RINGERS IV BOLUS
500.0000 mL | Freq: Once | INTRAVENOUS | Status: AC
  Administered 2020-08-12: 500 mL via INTRAVENOUS

## 2020-08-12 MED ORDER — DEXTROSE-NACL 5-0.45 % IV SOLN: INTRAVENOUS | Status: DC

## 2020-08-12 NOTE — H&P (Addendum)
History and Physical    Colton Guzman. OF:[id number] DOB: 1960-11-29 DOA: 08/12/2020  Referring MD/NP/PA: Thamas Jaegers, MD PCP: [name], MD  Patient coming from: Home  Chief Complaint: Blood in stools  I have personally briefly reviewed patient's old medical records in [place]   HPI: Colton Guzman Akhilesh Sparr. is a 60 y.o. male with medical history significant of hypertension, dyslipidemia, VT arrest, PAF on Eliquis, CAD s/p 5 vessel CABG 02/2020, and GI bleed presents today with complaints of blood in his stool starting this morning.  Reported having dark red blood with clots present.  He has had approximately 8 bowel movements since the start of symptoms.  He complains of feeling weak.  Patient has been taking all of his medications as prescribed including aspirin and Eliquis.  Last dose of Eliquis yesterday evening.  Patient denies having any complaints of chest pain, abdominal pain, or shortness of breath.   Patient was hospitalized in 02/2020 for GI bleed.  During that hospitalization patient had developed hypotension with hemodynamic instability and went into V. tach arrest on requiring CPR.  Post CPR patient was noted to be in atrial fibrillation.  EGD revealed signs of gastritis with pyloric erosions and colonoscopy had poor visualization due to blood throughout the colon.  Bleeding was thought most likely diverticular in nature.  Due to the patient symptoms he underwent heart cath which showed multivessel disease for which cardiothoracic surgery was consulted and he ultimately underwent 5 vessel CABG.  ED Course: Upon admission into the emergency department patient was seen to be afebrile, pulse 92-1 09, respirations 20-24, and all other vital signs maintained.  Labs significant for WBC 15.3, sodium 132, CO2 20, BUN 13, creatinine 0.74, and glucose 214.  Chest x-ray noted no acute cardiopulmonary findings.  Patient was typed and screened for need of blood products and  given 40 mg of Protonix IV.  TRH called to admit.  Review of Systems  Constitutional:  Positive for malaise/fatigue. Negative for fever.  HENT:  Negative for nosebleeds.   Eyes:  Negative for photophobia and pain.  Respiratory:  Negative for cough and shortness of breath.   Cardiovascular:  Negative for chest pain and leg swelling.  Gastrointestinal:  Positive for blood in stool. Negative for vomiting.  Genitourinary:  Negative for dysuria and hematuria.  Musculoskeletal:  Negative for falls.  Neurological:  Positive for loss of consciousness and weakness.  Psychiatric/Behavioral:  Negative for substance abuse.   All other systems reviewed and are negative.  Past Medical History:  Diagnosis Date   Diabetes mellitus without complication ([place])    Hypertension     Past Surgical History:  Procedure Laterality Date   COLONOSCOPY WITH PROPOFOL Left 02/27/2020   Procedure: COLONOSCOPY WITH PROPOFOL;  Surgeon: Otis Brace, MD;  Location: [facility];  Service: Gastroenterology;  Laterality: Left;   CORONARY ARTERY BYPASS GRAFT N/A 03/08/2020   Procedure: CORONARY ARTERY BYPASS GRAFTING (CABG) x 5 ON PUMP;  Surgeon: Wonda Olds, MD;  Location: [facility];  Service: Open Heart Surgery;  Laterality: N/A;   ENDOVEIN HARVEST OF GREATER SAPHENOUS VEIN Right 03/08/2020   Procedure: ENDOVEIN HARVEST OF GREATER SAPHENOUS VEIN;  Surgeon: Wonda Olds, MD;  Location: [facility];  Service: Open Heart Surgery;  Laterality: Right;   ESOPHAGOGASTRODUODENOSCOPY (EGD) WITH PROPOFOL N/A 02/27/2020   Procedure: ESOPHAGOGASTRODUODENOSCOPY (EGD) WITH PROPOFOL;  Surgeon: Otis Brace, MD;  Location: [facility];  Service: Gastroenterology;  Laterality: N/A;   LEFT HEART CATH  AND CORONARY ANGIOGRAPHY N/A 03/04/2020   Procedure: LEFT HEART CATH AND CORONARY ANGIOGRAPHY;  Surgeon: Burnell Blanks, MD;  Location: [facility];  Service: Cardiovascular;  Laterality: N/A;   RADIAL ARTERY HARVEST  Left 03/08/2020   Procedure: RADIAL ARTERY HARVEST;  Surgeon: Wonda Olds, MD;  Location: [facility];  Service: Open Heart Surgery;  Laterality: Left;   TEE WITHOUT CARDIOVERSION N/A 03/08/2020   Procedure: TRANSESOPHAGEAL ECHOCARDIOGRAM (TEE);  Surgeon: Wonda Olds, MD;  Location: [facility];  Service: Open Heart Surgery;  Laterality: N/A;     reports that he has never smoked. He has never used smokeless tobacco. He reports current alcohol use. No history on file for drug use.  No Known Allergies  No family history on file.  Prior to Admission medications   Medication Sig Start Date End Date Taking? Authorizing Provider  allopurinol (ZYLOPRIM) 100 MG tablet TAKE 1 TABLET BY MOUTH EVERY DAY 06/13/20   [name], MD  amLODipine (NORVASC) 10 MG tablet Take 10 mg by mouth daily.    [provider]  apixaban (ELIQUIS) 5 MG TABS tablet Take 1 tablet (5 mg total) by mouth 2 (two) times daily. 06/13/20   Freada Bergeron, MD  Ascorbic Acid (VITAMIN C) 1000 MG tablet Take 1,000 mg by mouth daily.    [provider]  aspirin EC 81 MG tablet Take 1 tablet (81 mg total) by mouth daily. Swallow whole. 03/28/20 03/28/21  Wonda Olds, MD  atorvastatin (LIPITOR) 80 MG tablet Take 1 tablet (80 mg total) by mouth daily. 07/28/20   [name], MD  dapagliflozin propanediol (FARXIGA) 10 MG TABS tablet Take 1 tablet (10 mg total) by mouth daily before breakfast. 05/18/20   [name], MD  Glucagon (GVOKE HYPOPEN 2-PACK) 1 MG/0.2ML SOAJ Inject 1 Act into the skin daily as needed. 05/22/20   [name], MD  isosorbide mononitrate (IMDUR) 30 MG 24 hr tablet Take 1 tablet (30 mg total) by mouth daily. 07/08/20 10/06/20  Freada Bergeron, MD  losartan (COZAAR) 50 MG tablet Take 1 tablet (50 mg total) by mouth daily. 05/17/20   Freada Bergeron, MD  Magnesium 300 MG CAPS Take 300 mg by mouth daily.    [provider]  metFORMIN (GLUCOPHAGE) 1000 MG tablet Take 1  tablet (1,000 mg total) by mouth 2 (two) times daily with a meal. 07/26/20   [name], MD  metoprolol succinate (TOPROL XL) 25 MG 24 hr tablet Take 1 tablet (25 mg total) by mouth daily. 03/13/20 03/13/21  Antony Odea, PA-C  Multiple Vitamin (MULTIVITAMIN WITH MINERALS) TABS tablet Take 1 tablet by mouth daily.    [provider]  Semaglutide (RYBELSUS) 3 MG TABS Take 3 mg by mouth daily. 07/26/20 08/25/20  [name], MD  traMADol (ULTRAM) 50 MG tablet Take 1 tablet (50 mg total) by mouth every 6 (six) hours as needed. 07/26/20 07/26/21  [name], MD  Vitamin D, Ergocalciferol, (DRISDOL) 1.25 MG (50000 UNIT) CAPS capsule Take 50,000 Units by mouth every 7 (seven) days.    [provider]    Physical Exam:  Constitutional: Middle-age male who appears to be in some distress Vitals:   08/12/20 0815 08/12/20 0845 08/12/20 0915 08/12/20 0945  BP: 123/78 120/87 125/86 (!) 120/91  Pulse: 94 94 92 95  Resp: (!) 24 (!) 24 (!) 24 (!) 24  Temp:      TempSrc:  SpO2: 97% 96% 96% 96%   Eyes: PERRL, lids and conjunctivae normal ENMT: Mucous membranes are moist. Posterior pharynx clear of any exudate or lesions.  Neck: normal, supple, no masses, no thyromegaly Respiratory: clear to auscultation bilaterally, no wheezing, no crackles. Normal respiratory effort. No accessory muscle use.  Cardiovascular: Tachycardic, no murmurs / rubs / gallops. No extremity edema. 2+ pedal pulses. No carotid bruits.  Abdomen: no tenderness, no masses palpated. No hepatosplenomegaly. Bowel sounds positive.  Musculoskeletal: no clubbing / cyanosis. No joint deformity upper and lower extremities. Good ROM, no contractures. Normal muscle tone.  Skin: no rashes, lesions, ulcers.  Pallor present. Neurologic: CN 2-12 grossly intact. Sensation intact, DTR normal. Strength 5/5 in all 4.  Psychiatric: Normal judgment and insight. Alert and oriented x 3. Normal mood.     Labs on Admission: I  have personally reviewed following labs and imaging studies  CBC: Recent Labs  Lab 08/12/20 0727  WBC 15.3*  HGB 13.2  HCT 40.5  MCV 89.8  PLT 0000000   Basic Metabolic Panel: Recent Labs  Lab 08/12/20 0727  NA 132*  K 4.0  CL 101  CO2 20*  GLUCOSE 214*  BUN 13  CREATININE 0.74  CALCIUM 9.0   GFR: Estimated Creatinine Clearance: 97.4 mL/min (by C-G formula based on SCr of 0.74 mg/dL). Liver Function Tests: Recent Labs  Lab 08/12/20 0727  AST 22  ALT 27  ALKPHOS 70  BILITOT 1.0  PROT 7.9  ALBUMIN 4.1   No results for input(s): LIPASE, AMYLASE in the last 168 hours. No results for input(s): AMMONIA in the last 168 hours. Coagulation Profile: No results for input(s): INR, PROTIME in the last 168 hours. Cardiac Enzymes: No results for input(s): CKTOTAL, CKMB, CKMBINDEX, TROPONINI in the last 168 hours. BNP (last 3 results) Recent Labs    07/26/20 1144  PROBNP 37.0   HbA1C: No results for input(s): HGBA1C in the last 72 hours. CBG: No results for input(s): GLUCAP in the last 168 hours. Lipid Profile: No results for input(s): CHOL, HDL, LDLCALC, TRIG, CHOLHDL, LDLDIRECT in the last 72 hours. Thyroid Function Tests: No results for input(s): TSH, T4TOTAL, FREET4, T3FREE, THYROIDAB in the last 72 hours. Anemia Panel: No results for input(s): VITAMINB12, FOLATE, FERRITIN, TIBC, IRON, RETICCTPCT in the last 72 hours. Urine analysis:    Component Value Date/Time   COLORURINE YELLOW 03/08/2020 0531   APPEARANCEUR HAZY (A) 03/08/2020 0531   LABSPEC 1.017 03/08/2020 0531   PHURINE 6.0 03/08/2020 0531   GLUCOSEU NEGATIVE 03/08/2020 0531   HGBUR NEGATIVE 03/08/2020 0531   BILIRUBINUR NEGATIVE 03/08/2020 [place] 03/08/2020 0531   PROTEINUR NEGATIVE 03/08/2020 0531   NITRITE NEGATIVE 03/08/2020 0531   LEUKOCYTESUR NEGATIVE 03/08/2020 0531   Sepsis Labs: Recent Results (from the past 240 hour(s))  Resp Panel by RT-PCR (Flu A&B, Covid)  Nasopharyngeal Swab     Status: None   Collection Time: 08/12/20  8:01 AM   Specimen: Nasopharyngeal Swab; Nasopharyngeal(NP) swabs in vial transport medium  Result Value Ref Range Status   SARS Coronavirus 2 by RT PCR NEGATIVE NEGATIVE Final    Comment: (NOTE) SARS-CoV-2 target nucleic acids are NOT DETECTED.  The SARS-CoV-2 RNA is generally detectable in upper respiratory specimens during the acute phase of infection. The lowest concentration of SARS-CoV-2 viral copies this assay can detect is 138 copies/mL. A negative result does not preclude SARS-Cov-2 infection and should not be used as the sole basis for treatment or other patient management  decisions. A negative result may occur with  improper specimen collection/handling, submission of specimen other than nasopharyngeal swab, presence of viral mutation(s) within the areas targeted by this assay, and inadequate number of viral copies(<138 copies/mL). A negative result must be combined with clinical observations, patient history, and epidemiological information. The expected result is Negative.  Fact Sheet for Patients:  EntrepreneurPulse.com.au  Fact Sheet for Healthcare Providers:  IncredibleEmployment.be  This test is no t yet approved or cleared by the [place] FDA and  has been authorized for detection and/or diagnosis of SARS-CoV-2 by FDA under an Emergency Use Authorization (EUA). This EUA will remain  in effect (meaning this test can be used) for the duration of the COVID-19 declaration under Section 564(b)(1) of the Act, 21 U.S.C.section 360bbb-3(b)(1), unless the authorization is terminated  or revoked sooner.       Influenza A by PCR NEGATIVE NEGATIVE Final   Influenza B by PCR NEGATIVE NEGATIVE Final    Comment: (NOTE) The Xpert Xpress SARS-CoV-2/FLU/RSV plus assay is intended as an aid in the diagnosis of influenza from Nasopharyngeal swab specimens and should not be  used as a sole basis for treatment. Nasal washings and aspirates are unacceptable for Xpert Xpress SARS-CoV-2/FLU/RSV testing.  Fact Sheet for Patients: EntrepreneurPulse.com.au  Fact Sheet for Healthcare Providers: IncredibleEmployment.be  This test is not yet approved or cleared by the [place] FDA and has been authorized for detection and/or diagnosis of SARS-CoV-2 by FDA under an Emergency Use Authorization (EUA). This EUA will remain in effect (meaning this test can be used) for the duration of the COVID-19 declaration under Section 564(b)(1) of the Act, 21 U.S.C. section 360bbb-3(b)(1), unless the authorization is terminated or revoked.  Performed at [facility], [place] [address], [place], [place] [address]      Radiological Exams on Admission: DG Chest Port 1 View  Result Date: 08/12/2020 CLINICAL DATA:  GI bleed. EXAM: PORTABLE CHEST 1 VIEW COMPARISON:  03/28/2020 FINDINGS: The cardiac silhouette, mediastinal and hilar contours is stable. The lungs are clear. No pleural effusions. No pulmonary lesions. The bony thorax is intact. IMPRESSION: No acute cardiopulmonary findings. Electronically Signed   By: Marijo Sanes M.D.   On: 08/12/2020 08:58    Assessment/Plan Acute blood loss secondary to GI bleed: Acute.  Patient presents after having several bloody bowel movements at home.  Initial hemoglobin 13.2 but repeat 12 g/dL.  Patient continuing to have bloody bowel movements while in the hospital and had 2 syncopal episodes.  Previous colonoscopy and EGD from January did not find a clear source of bleeding, but was presumed to be diverticular in nature.  Given patient's symptoms today would also suspect likely diverticular bleed.  CT angiogram of the chest abdomen pelvis was performed but did not show any sign of active bleeding. -Initial orders placed for a medical telemetry bed, but upgraded to a progressive bed due to severity of  patient bleeding -Bedrest -Clear liquid diet for now, and n.p.o. after midnight for planned colonoscopy in a.m. with GI -H&H every 6 hours -Transfuse blood products as needed for symptomatic anemia or hemoglobin less than 8.5 g/dL -Discontinue D5-LR IV fluids and start lactated Ringer's at 100 mL/h -Appreciate GI consultative services,will follow for any further recommendation -Orders placed to transfuse 1 unit of packed red blood cells due to severity of patient bleeding given prior history.  Leukocytosis: WBC elevated 15.3.  Suspect reactive in nature to acute bleed. -Recheck CBC tomorrow morning  Syncope: Acute.  Suspect secondary orthostatic hypotension or Vagal response in the setting of acute blood loss. -Follow-up EKG and telemetry -Lactated Ringer IV fluids at 100 mL/h  Diabetes mellitus type 2, uncontrolled: Last hemoglobin A1c was 8.3 on 6/7.  On admission patient blood sugars were noted to be elevated to 14.  Patient had initially been placed on D5-LR IV fluids at 100 mL/h. -Hypoglycemic protocols -Hold home oral medications -CBGs before every meal with moderate SSI -Adjust insulin regimen as needed  Paroxysmal atrial fibrillation on chronic anticoagulation: Patient currently appears to be in sinus tachycardia.  CHA2DS2-VASc score = 2. He last took Eliquis yesterday evening.  Due to the severity of patient's bleeding he was given Kcentra. -Hold Eliquis  CAD s/p CABG history of VT arrest: Patient status post 5 vessel CABG in 02/2020 after undergoing VT arrest. -Continue statin -Aspirin was held due to severity of GI bleed  Essential hypertension: Patient's blood pressures have been as low as 104/79. -Hold blood pressure medications due to acute bleed and syncopal episode -Reevaluate and determine when appropriate to restart  Dyslipidemia -Continue atorvastatin  DVT prophylaxis: SCD Code Status: Full Family Communication: Wife updated at bedside Disposition Plan:  Home Consults called: GI and cardiology Admission status: Observation  Norval Morton MD [facility]   If 7PM-7AM, please contact night-coverage   08/12/2020, 10:03 AM

## 2020-08-12 NOTE — Plan of Care (Signed)
  Problem: Education: Goal: Knowledge of General Education information will improve Description: Including pain rating scale, medication(s)/side effects and non-pharmacologic comfort measures Outcome: Progressing   Problem: Education: Goal: Ability to identify signs and symptoms of gastrointestinal bleeding will improve Outcome: Progressing

## 2020-08-12 NOTE — Anesthesia Preprocedure Evaluation (Addendum)
Anesthesia Evaluation  Patient identified by MRN, date of birth, ID band Patient awake    Reviewed: Allergy & Precautions, NPO status , Patient's Chart, lab work & pertinent test results  History of Anesthesia Complications Negative for: history of anesthetic complications  Airway Mallampati: I  TM Distance: >3 FB Neck ROM: Full    Dental  (+) Lower Dentures, Upper Dentures, Dental Advisory Given   Pulmonary former smoker,  08/12/2020 SARS coronavirus NEG   breath sounds clear to auscultation       Cardiovascular hypertension, Pt. on medications (-) angina+ CAD and + CABG (02/2020)  + dysrhythmias Atrial Fibrillation  Rhythm:Irregular Rate:Normal  04/2020 ECHO: EF 50-55%, no significant valvular abnormalities   Neuro/Psych negative neurological ROS  negative psych ROS   GI/Hepatic Neg liver ROS, GI bleed   Endo/Other  diabetes (glu 194), Oral Hypoglycemic Agents  Renal/GU      Musculoskeletal  (+) Arthritis ,   Abdominal   Peds  Hematology  (+) Blood dyscrasia (eliquis, Hb 9.8), anemia ,   Anesthesia Other Findings   Reproductive/Obstetrics                            Anesthesia Physical Anesthesia Plan  ASA: 3  Anesthesia Plan: MAC   Post-op Pain Management:    Induction:   PONV Risk Score and Plan: 1 and Ondansetron and Treatment may vary due to age or medical condition  Airway Management Planned: Natural Airway and Simple Face Mask  Additional Equipment: None  Intra-op Plan:   Post-operative Plan:   Informed Consent: I have reviewed the patients History and Physical, chart, labs and discussed the procedure including the risks, benefits and alternatives for the proposed anesthesia with the patient or authorized representative who has indicated his/her understanding and acceptance.     Dental advisory given  Plan Discussed with: CRNA and Surgeon  Anesthesia Plan  Comments:        Anesthesia Quick Evaluation

## 2020-08-12 NOTE — H&P (View-Only) (Signed)
Consultation  Referring Provider: [facility]/ Tamala Julian Primary Care Physician:  [name], MD Primary Gastroenterologist:  none/ [facility] 1/22  Reason for Consultation:  GI bleed  HPI: Colton Guzman. is a 60 y.o. male, who we are asked to evaluate after he presented to the emergency room this morning with acute onset of large-volume bloody stools.  Patient says he was awakened from sleep with urge for bowel movement and then passed stool with dark blood.  He has had 5 more episodes since then and says that the stool is now all just bright red blood, his last episode was a large amount. He has no complaints of abdominal pain or cramping, he feels a bit weak but has not had any syncope or presyncope, no nausea or vomiting. Vitals stable on arrival though tachycardic.  Patient is on Eliquis for history of atrial fibrillation and last took a dose last night. Hemoglobin on admit 13.2, hemoglobin 2 weeks ago on 07/26/2020 was 14, hemoglobin March 2000 2212.5  Patient had an admission in January 2022 with acute lower GI bleed which was his initial episode, this was a major lower GI bleed with associated hypotension, he developed hemodynamic instability and had a V. tach arrest on 02/23/2020 and non-ST EMI. He underwent colonoscopy and EGD per Dr. Alessandra Bevels on 02/27/2020.  EGD was unremarkable with the exception of some minimal pyloric erosions and colonoscopy revealed blood throughout the colon with multiple clots and poor visualization.  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 He had had CT angiography on 02/22/2020 that showed some mild wall thickening in the descending colon and scattered diverticulosis  He underwent CABG x5 on 03/08/2020. He has history of hypertension, adult onset diabetes mellitus.  He did have atrial fibrillation during his admission in January and was discharged home on Eliquis and also taking baby aspirin.   Past Medical History:  Diagnosis Date  . CAD  (coronary artery disease) of artery bypass graft 02/2020  . Diabetes mellitus without complication ([place])   . Hypertension     Past Surgical History:  Procedure Laterality Date  . COLONOSCOPY WITH PROPOFOL Left 02/27/2020   Procedure: COLONOSCOPY WITH PROPOFOL;  Surgeon: Otis Brace, MD;  Location: [facility];  Service: Gastroenterology;  Laterality: Left;  . CORONARY ARTERY BYPASS GRAFT N/A 03/08/2020   Procedure: CORONARY ARTERY BYPASS GRAFTING (CABG) x 5 ON PUMP;  Surgeon: Wonda Olds, MD;  Location: [facility];  Service: Open Heart Surgery;  Laterality: N/A;  . ENDOVEIN HARVEST OF GREATER SAPHENOUS VEIN Right 03/08/2020   Procedure: ENDOVEIN HARVEST OF GREATER SAPHENOUS VEIN;  Surgeon: Wonda Olds, MD;  Location: [facility];  Service: Open Heart Surgery;  Laterality: Right;  . ESOPHAGOGASTRODUODENOSCOPY (EGD) WITH PROPOFOL N/A 02/27/2020   Procedure: ESOPHAGOGASTRODUODENOSCOPY (EGD) WITH PROPOFOL;  Surgeon: Otis Brace, MD;  Location: [facility];  Service: Gastroenterology;  Laterality: N/A;  . LEFT HEART CATH AND CORONARY ANGIOGRAPHY N/A 03/04/2020   Procedure: LEFT HEART CATH AND CORONARY ANGIOGRAPHY;  Surgeon: Burnell Blanks, MD;  Location: [facility];  Service: Cardiovascular;  Laterality: N/A;  . RADIAL ARTERY HARVEST Left 03/08/2020   Procedure: RADIAL ARTERY HARVEST;  Surgeon: Wonda Olds, MD;  Location: [facility];  Service: Open Heart Surgery;  Laterality: Left;  . TEE WITHOUT CARDIOVERSION N/A 03/08/2020   Procedure: TRANSESOPHAGEAL ECHOCARDIOGRAM (TEE);  Surgeon: Wonda Olds, MD;  Location: [facility];  Service: Open Heart Surgery;  Laterality: N/A;    Prior to Admission medications  Medication Sig Start Date End Date Taking? Authorizing Provider  allopurinol (ZYLOPRIM) 100 MG tablet TAKE 1 TABLET BY MOUTH EVERY DAY 06/13/20   [name], MD  amLODipine (NORVASC) 10 MG tablet Take 10 mg by mouth daily.    [provider]  apixaban  (ELIQUIS) 5 MG TABS tablet Take 1 tablet (5 mg total) by mouth 2 (two) times daily. 06/13/20   Freada Bergeron, MD  Ascorbic Acid (VITAMIN C) 1000 MG tablet Take 1,000 mg by mouth daily.    [provider]  aspirin EC 81 MG tablet Take 1 tablet (81 mg total) by mouth daily. Swallow whole. 03/28/20 03/28/21  Wonda Olds, MD  atorvastatin (LIPITOR) 80 MG tablet Take 1 tablet (80 mg total) by mouth daily. 07/28/20   [name], MD  dapagliflozin propanediol (FARXIGA) 10 MG TABS tablet Take 1 tablet (10 mg total) by mouth daily before breakfast. 05/18/20   [name], MD  Glucagon (GVOKE HYPOPEN 2-PACK) 1 MG/0.2ML SOAJ Inject 1 Act into the skin daily as needed. 05/22/20   [name], MD  isosorbide mononitrate (IMDUR) 30 MG 24 hr tablet Take 1 tablet (30 mg total) by mouth daily. 07/08/20 10/06/20  Freada Bergeron, MD  losartan (COZAAR) 50 MG tablet Take 1 tablet (50 mg total) by mouth daily. 05/17/20   Freada Bergeron, MD  Magnesium 300 MG CAPS Take 300 mg by mouth daily.    [provider]  metFORMIN (GLUCOPHAGE) 1000 MG tablet Take 1 tablet (1,000 mg total) by mouth 2 (two) times daily with a meal. 07/26/20   [name], MD  metoprolol succinate (TOPROL XL) 25 MG 24 hr tablet Take 1 tablet (25 mg total) by mouth daily. 03/13/20 03/13/21  Antony Odea, PA-C  Multiple Vitamin (MULTIVITAMIN WITH MINERALS) TABS tablet Take 1 tablet by mouth daily.    [provider]  Semaglutide (RYBELSUS) 3 MG TABS Take 3 mg by mouth daily. 07/26/20 08/25/20  [name], MD  traMADol (ULTRAM) 50 MG tablet Take 1 tablet (50 mg total) by mouth every 6 (six) hours as needed. 07/26/20 07/26/21  [name], MD  Vitamin D, Ergocalciferol, (DRISDOL) 1.25 MG (50000 UNIT) CAPS capsule Take 50,000 Units by mouth every 7 (seven) days.    [provider]    Current Facility-Administered Medications  Medication Dose Route Frequency Provider Last Rate Last Admin   . acetaminophen (TYLENOL) tablet 650 mg  650 mg Oral Q6H PRN Norval Morton, MD       Or  . acetaminophen (TYLENOL) suppository 650 mg  650 mg Rectal Q6H PRN Smith, Rondell A, MD      . albuterol (PROVENTIL) (2.5 MG/3ML) 0.083% nebulizer solution 2.5 mg  2.5 mg Nebulization Q6H PRN Tamala Julian, Rondell A, MD      . ondansetron (ZOFRAN) tablet 4 mg  4 mg Oral Q6H PRN Fuller Plan A, MD       Or  . ondansetron (ZOFRAN) injection 4 mg  4 mg Intravenous Q6H PRN Smith, Rondell A, MD      . pantoprazole (PROTONIX) injection 40 mg  40 mg Intravenous BID Smith, Rondell A, MD      . sodium chloride flush (NS) 0.9 % injection 3 mL  3 mL Intravenous Q12H Smith, Rondell A, MD   3 mL at 08/12/20 1024   Current Outpatient Medications  Medication Sig Dispense Refill  . allopurinol (ZYLOPRIM) 100 MG tablet TAKE 1 TABLET BY  MOUTH EVERY DAY 90 tablet 0  . amLODipine (NORVASC) 10 MG tablet Take 10 mg by mouth daily.    Marland Kitchen apixaban (ELIQUIS) 5 MG TABS tablet Take 1 tablet (5 mg total) by mouth 2 (two) times daily. 60 tablet 5  . Ascorbic Acid (VITAMIN C) 1000 MG tablet Take 1,000 mg by mouth daily.    Marland Kitchen aspirin EC 81 MG tablet Take 1 tablet (81 mg total) by mouth daily. Swallow whole. 150 tablet 2  . atorvastatin (LIPITOR) 80 MG tablet Take 1 tablet (80 mg total) by mouth daily. 90 tablet 1  . dapagliflozin propanediol (FARXIGA) 10 MG TABS tablet Take 1 tablet (10 mg total) by mouth daily before breakfast. 90 tablet 1  . Glucagon (GVOKE HYPOPEN 2-PACK) 1 MG/0.2ML SOAJ Inject 1 Act into the skin daily as needed. 2 mL 5  . isosorbide mononitrate (IMDUR) 30 MG 24 hr tablet Take 1 tablet (30 mg total) by mouth daily. 90 tablet 3  . losartan (COZAAR) 50 MG tablet Take 1 tablet (50 mg total) by mouth daily. 90 tablet 3  . Magnesium 300 MG CAPS Take 300 mg by mouth daily.    . metFORMIN (GLUCOPHAGE) 1000 MG tablet Take 1 tablet (1,000 mg total) by mouth 2 (two) times daily with a meal. 180 tablet 1  . metoprolol  succinate (TOPROL XL) 25 MG 24 hr tablet Take 1 tablet (25 mg total) by mouth daily. 30 tablet 11  . Multiple Vitamin (MULTIVITAMIN WITH MINERALS) TABS tablet Take 1 tablet by mouth daily.    . Semaglutide (RYBELSUS) 3 MG TABS Take 3 mg by mouth daily. 30 tablet 0  . traMADol (ULTRAM) 50 MG tablet Take 1 tablet (50 mg total) by mouth every 6 (six) hours as needed. 90 tablet 3  . Vitamin D, Ergocalciferol, (DRISDOL) 1.25 MG (50000 UNIT) CAPS capsule Take 50,000 Units by mouth every 7 (seven) days.      Allergies as of 08/12/2020  . (No Known Allergies)    No family history on file.  Social History   Socioeconomic History  . Marital status: Married    Spouse name: Not on file  . Number of children: Not on file  . Years of education: Not on file  . Highest education level: Not on file  Occupational History  . Not on file  Tobacco Use  . Smoking status: Never  . Smokeless tobacco: Never  Substance and Sexual Activity  . Alcohol use: Yes  . Drug use: Not on file  . Sexual activity: Not on file  Other Topics Concern  . Not on file  Social History Narrative  . Not on file   Social Determinants of Health   Financial Resource Strain: Not on file  Food Insecurity: Not on file  Transportation Needs: Not on file  Physical Activity: Not on file  Stress: Not on file  Social Connections: Not on file  Intimate Partner Violence: Not on file    Review of Systems: Pertinent positive and negative review of systems were noted in the above HPI section.  All other review of systems was otherwise negative.   Physical Exam: Vital signs in last 24 hours: Temp:  [98.6 F (37 C)] 98.6 F (37 C) (06/24 0646) Pulse Rate:  [92-109] 101 (06/24 1015) Resp:  [20-26] 26 (06/24 1015) BP: (120-132)/(78-103) 123/88 (06/24 1015) SpO2:  [94 %-98 %] 97 % (06/24 1015)   General:   Alert,  Well-developed, well-nourished, male pleasant and cooperative in NAD Wife  at bedside Head:  Normocephalic and  atraumatic. Eyes:  Sclera clear, no icterus.   Conjunctiva pink. Ears:  Normal auditory acuity. Nose:  No deformity, discharge,  or lesions. Mouth:  No deformity or lesions.   Neck:  Supple; no masses or thyromegaly. Lungs:  Clear throughout to auscultation.   No wheezes, crackles, or rhonchi. Heart:  tachyRegular rate and rhythm; no murmurs, clicks, rubs,  or gallops. Abdomen:  Soft,nontender, BS active,nonpalp mass or hsm.   Rectal: Not done Msk:  Symmetrical without gross deformities. . Pulses:  Normal pulses noted. Extremities:  Without clubbing or edema. Neurologic:  Alert and  oriented x4;  grossly normal neurologically. Skin:  Intact without significant lesions or rashes.. Psych:  Alert and cooperative. Normal mood and affect.  Intake/Output from previous day: No intake/output data recorded. Intake/Output this shift: No intake/output data recorded.  Lab Results: Recent Labs    08/12/20 0727  WBC 15.3*  HGB 13.2  HCT 40.5  PLT 181   BMET Recent Labs    08/12/20 0727  NA 132*  K 4.0  CL 101  CO2 20*  GLUCOSE 214*  BUN 13  CREATININE 0.74  CALCIUM 9.0   LFT Recent Labs    08/12/20 0727  PROT 7.9  ALBUMIN 4.1  AST 22  ALT 27  ALKPHOS 70  BILITOT 1.0      IMPRESSION:   #86 60 year old male with acute recurrent lower GI bleed, onset this a.m. setting of Eliquis and aspirin.  Patient has had 6 episodes of fairly large volume bright red blood per rectum.  On arrival hemodynamically stable and hemoglobin is 13.2.  Patient had a major GI bleed January 2022 felt to be diverticular in etiology, though at colonoscopy had a lot of red blood in the colon with poor visualization of the negative EGD.  Patient had associated V. tach arrest, non-STEMI.  #2 coronary artery disease status post CABG times 24 February 2020 #3 atrial fibrillation-on Eliquis and aspirin #4 adult onset diabetes mellitus #5 hypertension #6 hyperlipidemia  Plan; n.p.o., Hold  Eliquis Serial hemoglobins every 6 hours and transfuse as indicated Place second IV and start IV fluids Will send patient for stat CT angiography abdomen pelvis and if evidence of active bleed would hope to proceed to IR for embolization  Will also ask for pharmacy consultation regarding potential reversal of Eliquis with Andexxa  GI will follow closely with you  Addendum-3:30 PM-CT angio does not show any evidence of active hemorrhage, coloiculosis, hepatic steatosis, cholelithiasis, there is a large left inguinal hernia containing a large portion of the sigmoid colon.  Last hemoglobin 12  Patient did receive Kcentra per pharmacy  He has had 1 more large bloody stool about 1 hour ago  Continue close observation, clear liquid diet. Colonoscopy in January with large amount of blood in the colon obscuring good visualization, for that reason we will plan to proceed with repeat colonoscopy this admission once he has stopped bleeding and can prep bowel, probably Sunday  Radiology findings, labs and plan discussed with patient and wife at bedsidenic divert  A[facility]  08/12/2020, 10:51 AM     [facility] Attending   I have taken an interval history, reviewed the chart and examined the patient. I agree with the Advanced Practitioner's note, impression and recommendations.    CBC Latest Ref Rng & Units 08/12/2020 08/12/2020 07/26/2020  WBC 4.0 - 10.5 K/uL - 15.3(H) 8.8  Hemoglobin 13.0 - 17.0 g/dL 12.0(L) 13.2 14.0  Hematocrit  39.0 - 52.0 % 36.3(L) 40.5 42.7  Platelets 150 - 400 K/uL - 181 203.0    I have seen the patient with the hospitalist Dr. Tamala Julian the nurse present.  Patient's wife is understandably concerned because he continues to have hematochezia.  Hemoglobin down to 12.  Blood pressures are soft he is a bit tachycardic.  We will give fluid bolus and I think given his prior history and what is going on 1 unit of blood is reasonable.  He is being moved to progressive  care.   He will be prepped for colonoscopy tomorrow.  I have explained to the wife that we are doing everything we can at this point and that this is again a likely diverticular hemorrhage but I clearly want to look and see if there is something else that could be causing this that could be more treatable if you well.   Gatha Mayer, MD, [facility] 08/12/2020 5:31 PM

## 2020-08-12 NOTE — Progress Notes (Signed)
   08/12/20 2054  Assess: MEWS Score  Temp 98.5 F (36.9 C)  BP 110/70  Pulse Rate (!) 118  ECG Heart Rate (!) 118  Resp (!) 24  Level of Consciousness Alert  SpO2 97 %  O2 Device Room Air  Assess: MEWS Score  MEWS Temp 0  MEWS Systolic 0  MEWS Pulse 2  MEWS RR 1  MEWS LOC 0  MEWS Score 3  MEWS Score Color Yellow  Assess: if the MEWS score is Yellow or Red  Were vital signs taken at a resting state? Yes  Focused Assessment No change from prior assessment  Early Detection of Sepsis Score *See Row Information* Medium  MEWS guidelines implemented *See Row Information* No, previously yellow, continue vital signs every 4 hours  Treat  MEWS Interventions Escalated (See documentation below)  Pain Scale 0-10  Pain Score 0  Take Vital Signs  Increase Vital Sign Frequency  Yellow: Q 2hr X 2 then Q 4hr X 2, if remains yellow, continue Q 4hrs  Escalate  MEWS: Escalate Yellow: discuss with charge nurse/RN and consider discussing with provider and RRT  Notify: Charge Nurse/RN  Name of Charge Nurse/RN Notified [place], RN  Date Charge Nurse/RN Notified 08/12/20  Time Charge Nurse/RN Notified 2000  Notify: Provider  Provider Name/Title Tennis Must, MD  Date Provider Notified 08/12/20  Time Provider Notified 2010  Notification Type Page  Notification Reason Other (Comment) (mews , bright red stool)  Provider response See new orders (Lab H&H ordered)  Date of Provider Response 08/12/20  Time of Provider Response 2014

## 2020-08-12 NOTE — Progress Notes (Signed)
Patient to transfer to 5W at this time. GI and MD at bedside.

## 2020-08-12 NOTE — Consult Note (Signed)
Cardiology Consultation:   Patient ID: Colton Guzman. MRN: [id number]; DOB: 05/25/1960  Admit date: 08/12/2020 Date of Consult: 08/12/2020  PCP:  [name], MD   [facility] Providers Cardiologist:  Freada Bergeron, MD   {   Patient Profile:   Colton Guzman. is a 60 y.o. male with a hx of CAD s/p CABG, PAF on eliquis, hx of lower GI bleed 02/2020, HTN, HLD, and DM who is being seen 08/12/2020 for the evaluation of GI bleed on anticoagulation at the request of Dr. Tamala Julian.  History of Present Illness:   Mr. Deer was admitted 02/22/20 with GI bleed associated with hypotension and hemodynamic instability. On 02/23/20 he had CP during bowel movement initially responded to nitro, but later recurred with subsequent VT arrest. He received CPR for 2-5 min and shock. Post shock rhythm was Afib.  GI workup unremarkable, GI bleed felt secondary to diverticular hemorrhage. He was stabilized and had LHC 03/04/20 with severe 3v disease including 70% proximal RCA, severe proximal LAD stenosis and 100% occlusion of ostial Cx. CT surgery was consulted and he underwent CABG x 5 on 03/08/20 with LIMA-LAD, SV-PDA., SVG-PLA, left radial artery-ramus intermediate and first obtuse marginal coronary arteries. He discharged home on 03/13/20. He subsequently wore a heart monitor that did show episodes of Afib for which eliquis was continued.   He presented to [facility] 08/12/20 with recurrent GI bleed. He reported an episode of hematochezia this morning prompting evaluation. He has had five more episodes since. He was admitted and GI consulted. Pt received Kcentra and is now on clear liquis diet. Plan repeat colonoscopy ?Monday. Eliquis on hold. While in the hospital earlier today he got up to go to the bathrooma and became very dizzy and passed out for a very short period.  Now in bed and denies any further dizziness.  He denies any chest pain, SOB, PND, orthopnea, LE edema.  He currently is in sinus  tachycardia at 120bpm.   Cardiology was consulted for recommendations for anticoagulation.     Past Medical History:  Diagnosis Date   CAD (coronary artery disease) of artery bypass graft 02/2020   Diabetes mellitus without complication (HCC)    GI bleed    Hyperlipidemia LDL goal <70    Hypertension    PAF (paroxysmal atrial fibrillation) ([place])     Past Surgical History:  Procedure Laterality Date   COLONOSCOPY WITH PROPOFOL Left 02/27/2020   Procedure: COLONOSCOPY WITH PROPOFOL;  Surgeon: Otis Brace, MD;  Location: [facility];  Service: Gastroenterology;  Laterality: Left;   CORONARY ARTERY BYPASS GRAFT N/A 03/08/2020   Procedure: CORONARY ARTERY BYPASS GRAFTING (CABG) x 5 ON PUMP;  Surgeon: Wonda Olds, MD;  Location: [facility];  Service: Open Heart Surgery;  Laterality: N/A;   ENDOVEIN HARVEST OF GREATER SAPHENOUS VEIN Right 03/08/2020   Procedure: ENDOVEIN HARVEST OF GREATER SAPHENOUS VEIN;  Surgeon: Wonda Olds, MD;  Location: [place];  Service: Open Heart Surgery;  Laterality: Right;   ESOPHAGOGASTRODUODENOSCOPY (EGD) WITH PROPOFOL N/A 02/27/2020   Procedure: ESOPHAGOGASTRODUODENOSCOPY (EGD) WITH PROPOFOL;  Surgeon: Otis Brace, MD;  Location: [facility];  Service: Gastroenterology;  Laterality: N/A;   LEFT HEART CATH AND CORONARY ANGIOGRAPHY N/A 03/04/2020   Procedure: LEFT HEART CATH AND CORONARY ANGIOGRAPHY;  Surgeon: Burnell Blanks, MD;  Location: [place] [facility];  Service: Cardiovascular;  Laterality: N/A;   RADIAL ARTERY HARVEST Left 03/08/2020   Procedure: RADIAL ARTERY HARVEST;  Surgeon: Fredrich Romans  Z, MD;  Location: [place];  Service: Open Heart Surgery;  Laterality: Left;   TEE WITHOUT CARDIOVERSION N/A 03/08/2020   Procedure: TRANSESOPHAGEAL ECHOCARDIOGRAM (TEE);  Surgeon: Wonda Olds, MD;  Location: [place];  Service: Open Heart Surgery;  Laterality: N/A;     Home Medications:  Prior to Admission medications   Medication Sig Start  Date End Date Taking? Authorizing Provider  acetaminophen (TYLENOL) 500 MG tablet Take 1,000 mg by mouth every 4 (four) hours as needed (pain).   Yes [provider]  allopurinol (ZYLOPRIM) 100 MG tablet TAKE 1 TABLET BY MOUTH EVERY DAY 06/13/20  Yes [name], MD  amLODipine (NORVASC) 10 MG tablet Take 10 mg by mouth daily.   Yes [provider]  apixaban (ELIQUIS) 5 MG TABS tablet Take 1 tablet (5 mg total) by mouth 2 (two) times daily. 06/13/20  Yes Freada Bergeron, MD  Ascorbic Acid (VITAMIN C) 1000 MG tablet Take 1,000 mg by mouth daily.   Yes [provider]  aspirin EC 81 MG tablet Take 1 tablet (81 mg total) by mouth daily. Swallow whole. 03/28/20 03/28/21 Yes Wonda Olds, MD  atorvastatin (LIPITOR) 80 MG tablet Take 1 tablet (80 mg total) by mouth daily. 07/28/20  Yes [name], MD  dapagliflozin propanediol (FARXIGA) 10 MG TABS tablet Take 1 tablet (10 mg total) by mouth daily before breakfast. 05/18/20  Yes [name], MD  Glucagon (GVOKE HYPOPEN 2-PACK) 1 MG/0.2ML SOAJ Inject 1 Act into the skin daily as needed. 05/22/20  Yes [name], MD  isosorbide mononitrate (IMDUR) 30 MG 24 hr tablet Take 1 tablet (30 mg total) by mouth daily. 07/08/20 10/06/20 Yes Freada Bergeron, MD  losartan (COZAAR) 50 MG tablet Take 1 tablet (50 mg total) by mouth daily. 05/17/20  Yes Freada Bergeron, MD  Magnesium 300 MG CAPS Take 300 mg by mouth daily.   Yes [provider]  metFORMIN (GLUCOPHAGE) 1000 MG tablet Take 1 tablet (1,000 mg total) by mouth 2 (two) times daily with a meal. 07/26/20  Yes [name], MD  metoprolol succinate (TOPROL XL) 25 MG 24 hr tablet Take 1 tablet (25 mg total) by mouth daily. 03/13/20 03/13/21 Yes Roddenberry, Arlis Porta, PA-C  Multiple Vitamin (MULTIVITAMIN WITH MINERALS) TABS tablet Take 1 tablet by mouth daily.   Yes [provider]  Semaglutide (RYBELSUS) 3 MG TABS Take 3 mg by mouth daily. 07/26/20  08/25/20 Yes [name], MD  traMADol (ULTRAM) 50 MG tablet Take 1 tablet (50 mg total) by mouth every 6 (six) hours as needed. 07/26/20 07/26/21 Yes [name], MD  Vitamin D, Ergocalciferol, (DRISDOL) 1.25 MG (50000 UNIT) CAPS capsule Take 50,000 Units by mouth every 7 (seven) days. Tuesdays   Yes [provider]    Inpatient Medications: Scheduled Meds:  atorvastatin  80 mg Oral Daily   [START ON 08/13/2020] insulin aspart  0-15 Units Subcutaneous TID WC   pantoprazole (PROTONIX) IV  40 mg Intravenous BID   peg 3350 powder  0.5 kit Oral Once   sodium chloride flush  3 mL Intravenous Q12H   Continuous Infusions:  lactated ringers     PRN Meds: acetaminophen **OR** acetaminophen, albuterol, ondansetron **OR** ondansetron (ZOFRAN) IV  Allergies:   No Known Allergies  Social History:   Social History   Socioeconomic History   Marital status: Married    Spouse name: Not on file   Number of children: Not  on file   Years of education: Not on file   Highest education level: Not on file  Occupational History   Not on file  Tobacco Use   Smoking status: Never   Smokeless tobacco: Never  Substance and Sexual Activity   Alcohol use: Yes   Drug use: Not on file   Sexual activity: Not on file  Other Topics Concern   Not on file  Social History Narrative   Not on file   Social Determinants of Health   Financial Resource Strain: Not on file  Food Insecurity: Not on file  Transportation Needs: Not on file  Physical Activity: Not on file  Stress: Not on file  Social Connections: Not on file  Intimate Partner Violence: Not on file    Family History:   History reviewed. No pertinent family history.   ROS:  Please see the history of present illness.   All other ROS reviewed and negative.     Physical Exam/Data:   Vitals:   08/12/20 1252 08/12/20 1328 08/12/20 1600 08/12/20 1750  BP: 140/88 106/73 115/84 99/70  Pulse: (!) 105 (!) 108 (!) 112 (!) 119  Resp:  (!) 25 (!) 22 (!) 22 20  Temp:  98.9 F (37.2 C) (!) 97.4 F (36.3 C) 97.8 F (36.6 C)  TempSrc:  Oral Oral Oral  SpO2: 98% 99% 97% 96%  Weight:  80.4 kg    Height:  $Remove'5\' 8"'vatUNDt$  (1.727 m)      Intake/Output Summary (Last 24 hours) at 08/12/2020 1818 Last data filed at 08/12/2020 1726 Gross per 24 hour  Intake 874.52 ml  Output --  Net 874.52 ml   Last 3 Weights 08/12/2020 08/03/2020 07/26/2020  Weight (lbs) 177 lb 4 oz 180 lb 180 lb  Weight (kg) 80.4 kg 81.647 kg 81.647 kg     Body mass index is 26.95 kg/m.  General:  Well nourished, well developed, in no acute distress  HEENT: normal Lymph: no adenopathy Neck: no JVD Endocrine:  No thryomegaly Vascular: No carotid bruits; FA pulses 2+ bilaterally without bruits  Cardiac:  normal S1, S2; Regular and tachycardic; no murmur  Lungs:  clear to auscultation bilaterally, no wheezing, rhonchi or rales  Abd: soft, nontender, no hepatomegaly  Ext: no edema Musculoskeletal:  No deformities, BUE and BLE strength normal and equal Skin: warm and dry  Neuro:  CNs 2-12 intact, no focal abnormalities noted Psych:  Normal affect   EKG:  The EKG was personally reviewed and demonstrates:  has not been completed Telemetry:  Telemetry was personally reviewed and demonstrates:  sinus tachycardia at 120bpm  Relevant CV Studies:  Heart monitor 03/28/20: Patient monitoring period was from 03/28/20-04/26/20 Overall burden of Afib was <1% (1 hour and 31 minutes in total). HR ranged in Afib from 93-157bpm Predominant rhythm was NSR with average HR 93bpm (range from 65bpm to 171bpm) Occasional ventricular ectopy (both PVCs and NSVT) with 3% burden; no sustained VT Rare SVE <1% No signficant pauses or sustained ventricular arrhythmias   Laboratory Data:  High Sensitivity Troponin:  No results for input(s): TROPONINIHS in the last 720 hours.   Chemistry Recent Labs  Lab 08/12/20 0727  NA 132*  K 4.0  CL 101  CO2 20*  GLUCOSE 214*  BUN 13   CREATININE 0.74  CALCIUM 9.0  GFRNONAA >60  ANIONGAP 11    Recent Labs  Lab 08/12/20 0727  PROT 7.9  ALBUMIN 4.1  AST 22  ALT 27  ALKPHOS 70  BILITOT 1.0   Hematology Recent Labs  Lab 08/12/20 0727 08/12/20 1200  WBC 15.3*  --   RBC 4.51  --   HGB 13.2 12.0*  HCT 40.5 36.3*  MCV 89.8  --   MCH 29.3  --   MCHC 32.6  --   RDW 15.5  --   PLT 181  --    BNPNo results for input(s): BNP, PROBNP in the last 168 hours.  DDimer No results for input(s): DDIMER in the last 168 hours.   Radiology/Studies:  [facility] Chest Port 1 View  Result Date: 08/12/2020 CLINICAL DATA:  GI bleed. EXAM: PORTABLE CHEST 1 VIEW COMPARISON:  03/28/2020 FINDINGS: The cardiac silhouette, mediastinal and hilar contours is stable. The lungs are clear. No pleural effusions. No pulmonary lesions. The bony thorax is intact. IMPRESSION: No acute cardiopulmonary findings. Electronically Signed   By: Marijo Sanes M.D.   On: 08/12/2020 08:58   CT Angio Abd/Pel w/ and/or w/o  Result Date: 08/12/2020 CLINICAL DATA:  60 year old with melena. Evaluate for lower GI bleed. EXAM: CTA ABDOMEN AND PELVIS WITHOUT AND WITH CONTRAST TECHNIQUE: Multidetector CT imaging of the abdomen and pelvis was performed using the standard protocol during bolus administration of intravenous contrast. Multiplanar reconstructed images and MIPs were obtained and reviewed to evaluate the vascular anatomy. CONTRAST:  168mL OMNIPAQUE IOHEXOL 350 MG/ML SOLN COMPARISON:  02/22/2020 FINDINGS: VASCULAR Aorta: Mild atherosclerotic disease in the in the abdominal aorta without aneurysm, dissection or stenosis. Celiac: Patent without evidence of aneurysm, dissection, vasculitis or significant stenosis. SMA: Patent without evidence of aneurysm, dissection, vasculitis or significant stenosis. Renals: Both renal arteries are patent without evidence of aneurysm, dissection, vasculitis, fibromuscular dysplasia or significant stenosis. IMA: Patent without evidence  of aneurysm, dissection, vasculitis or significant stenosis. Inflow: Patent without evidence of aneurysm, dissection, vasculitis or significant stenosis. Proximal Outflow: Proximal femoral arteries are patent bilaterally. Veins: Portal venous system is patent. No gross abnormality to the IVC or renal veins. No gross abnormality to the iliac veins. Review of the MIP images confirms the above findings. NON-VASCULAR Lower chest: Prior median sternotomy.  Lung bases are clear. Hepatobiliary: Diffuse low-attenuation of the liver is suggestive for steatosis. No focal liver lesion. Multiple gallstones without gallbladder inflammation or distension. No significant biliary dilatation. Pancreas: Unremarkable. No pancreatic ductal dilatation or surrounding inflammatory changes. Spleen: Normal in size without focal abnormality. Adrenals/Urinary Tract: Adrenal glands are unremarkable. Kidneys are normal, without renal calculi, focal lesion, or hydronephrosis. Bladder is unremarkable. Stomach/Bowel: Normal appearance of the stomach. Normal appearance of small bowel with focal bowel inflammation or obstruction. Normal appendix. Scattered colonic diverticula without acute inflammation. Again noted is large left inguinal hernia containing portion of the sigmoid colon. There is no evidence for wall thickening or inflammation associated with this large inguinal hernia. No evidence for active GI bleeding. Lymphatic: No lymphadenopathy in the abdomen or pelvis. Reproductive: Prostate is unremarkable. Other: Negative for ascites. Large left inguinal hernia containing sigmoid colon but no inflammatory changes. A tiny umbilical hernia containing fat. Fat containing right inguinal hernia. Musculoskeletal: No acute bone abnormality. Degenerative facet disease in the lower lumbar spine. IMPRESSION: VASCULAR 1. No evidence for active GI bleeding. 2. Mild atherosclerotic disease in the abdominal aorta. Aortic Atherosclerosis (ICD10-I70.0). 3.  Main visceral arteries are patent. NON-VASCULAR 1. No acute abnormalities in the abdomen or pelvis. 2. Again noted is a large left inguinal hernia containing a large portion of the sigmoid colon. No acute inflammatory changes associated with this hernia and no  evidence for bowel obstruction. 3. Colonic diverticulosis. 4. Cholelithiasis. 5. Hepatic steatosis. Electronically Signed   By: Markus Daft M.D.   On: 08/12/2020 14:12     Assessment and Plan:   PAF Chronic anticoagulation - maintaining sinus but tachycardic likely related to acute GI bleed>>would not treat with BB at this time as it is likely compensatory to maintain adequate BP - Eliquis is on hold for acute GI bleed - This patients CHA2DS2-VASc Score and unadjusted Ischemic Stroke Rate (% per year) is equal to 4.8 % stroke rate/year from a score of 4 (dCHF, HTN, DM, CAD) - HASBLED score 3 --> 5.8%, high risk for major bleeding - his overall Afib burden was < 1% on heart monitor - suspect he may not tolerate OAC going forward --> may need to consider watchman device once he is more stable - will need GI to weigh in once stable on safety of reinitiating DOAC vs. Proceeding with Watchman device workup  Recurrent GI bleed - this is his second GI bleed in 6 months - per GI - DOAC for PAF on hold - he is tachycardic likely related to GI bleed - need to follow Hbg closely for acute trend downward given his tachycardia  CAD s/p CABG x 5 in the setting of DM Hypertension - restart ASA when able from a GI standpoint - no chest pain - maintained on amlodipine, imdur, toprol, losartan, farxiga, and imdur at home - hold anti-hypertensives in the setting of borderline BP - SSI, per primary - he is tachycardic but likely compensatory to maintain BP in setting of acute GI bleed and would not start BB until more stable  Hyperlipidemia with LDL goal < 70 - 80 mg lipitor - restart when taking PO   Risk Assessment/Risk Scores:   CHA2DS2-VASc  Score = 4  This indicates a 4.8% annual risk of stroke. The patient's score is based upon: CHF History: Yes HTN History: Yes Diabetes History: Yes Stroke History: No Vascular Disease History: Yes Age Score: 0 Gender Score: 0    For questions or updates, please contact [place] Please consult [email or web address] for contact info under    Signed, Fransico Him, MD [facility] 08/12/2020 6:18 PM

## 2020-08-12 NOTE — ED Notes (Signed)
Patient transported to CT 

## 2020-08-12 NOTE — Progress Notes (Addendum)
[facility] night shift.  The nursing staff reported that the patient just had an another bowel movement with bright red blood.  He is currently getting PRBC transfusion.  His most recently hemoglobin level was 9.8 g/dL 1747 today.  He is scheduled to get a CBC in the morning.  I have asked the staff to monitor his vital signs closely and will recheck another hemoglobin level around 2200.  Tennis Must, MD.  [phone] the nursing staff reported that the patient's hemoglobin level continues to trend down despite the blood transfusion earlier.  I have ordered for another unit of PRBC to be transfused.  Tennis Must, MD

## 2020-08-12 NOTE — ED Provider Notes (Signed)
[facility] EMERGENCY DEPARTMENT Provider Note   CSN: [id number] Arrival date & time: 08/12/20  [id number]     History No chief complaint on file.   Colton Borbe [place] Bro[place] Bonito. is a 60 y.o. male.  Of history of coronary artery disease, stents, V. tach arrest GI bleed in the past presents with recurrent GI bleed today.  Colton Guzman noticed this morning after having several bowel movements that were dark red and then having bright red blood coming from his rectum.  Colton Guzman continues on Eliquis due to his cardiac history.  Otherwise denies any headache or chest pain.  Denies fevers or cough or vomiting.  Denies abdominal pain.      Past Medical History:  Diagnosis Date   Diabetes mellitus without complication ([place])    Hypertension     Patient Active Problem List   Diagnosis Date Noted   Chronic left shoulder pain 07/26/2020   DOE (dyspnea on exertion) 07/26/2020   Vitamin B12 deficiency anemia due to intrinsic factor deficiency 05/19/2020   Primary osteoarthritis involving multiple joints 05/19/2020   BRBPR (bright red blood per rectum) 05/18/2020   Dyslipidemia, goal LDL below 70 05/18/2020   T2DM (type 2 diabetes mellitus) ([place]) 02/22/2020   Hypertension    Gout     Past Surgical History:  Procedure Laterality Date   COLONOSCOPY WITH PROPOFOL Left 02/27/2020   Procedure: COLONOSCOPY WITH PROPOFOL;  Surgeon: Otis Brace, MD;  [facility];  Service: Gastroenterology;  Laterality: Left;   CORONARY ARTERY BYPASS GRAFT N/A 03/08/2020   Procedure: CORONARY ARTERY BYPASS GRAFTING (CABG) x 5 ON PUMP;  Surgeon: Wonda Olds, MD;  [facility];  Service: Open Heart Surgery;  Laterality: N/A;   ENDOVEIN HARVEST OF GREATER SAPHENOUS VEIN Right 03/08/2020   Procedure: ENDOVEIN HARVEST OF GREATER SAPHENOUS VEIN;  Surgeon: Wonda Olds, MD;  Location: [place];  Service: Open Heart Surgery;  Laterality: Right;   ESOPHAGOGASTRODUODENOSCOPY (EGD) WITH PROPOFOL N/A  02/27/2020   Procedure: ESOPHAGOGASTRODUODENOSCOPY (EGD) WITH PROPOFOL;  Surgeon: Otis Brace, MD;  [facility];  Service: Gastroenterology;  Laterality: N/A;   LEFT HEART CATH AND CORONARY ANGIOGRAPHY N/A 03/04/2020   Procedure: LEFT HEART CATH AND CORONARY ANGIOGRAPHY;  Surgeon: Burnell Blanks, MD;  Location: [place] [facility];  Service: Cardiovascular;  Laterality: N/A;   RADIAL ARTERY HARVEST Left 03/08/2020   Procedure: RADIAL ARTERY HARVEST;  Surgeon: Wonda Olds, MD;  Location: [place];  Service: Open Heart Surgery;  Laterality: Left;   TEE WITHOUT CARDIOVERSION N/A 03/08/2020   Procedure: TRANSESOPHAGEAL ECHOCARDIOGRAM (TEE);  Surgeon: Wonda Olds, MD;  Location: [place];  Service: Open Heart Surgery;  Laterality: N/A;       No family history on file.  Social History   Tobacco Use   Smoking status: Never   Smokeless tobacco: Never  Substance Use Topics   Alcohol use: Yes    Home Medications Prior to Admission medications   Medication Sig Start Date End Date Taking? Authorizing Provider  allopurinol (ZYLOPRIM) 100 MG tablet TAKE 1 TABLET BY MOUTH EVERY DAY 06/13/20   [name], MD  amLODipine (NORVASC) 10 MG tablet Take 10 mg by mouth daily.    [provider]  apixaban (ELIQUIS) 5 MG TABS tablet Take 1 tablet (5 mg total) by mouth 2 (two) times daily. 06/13/20   Freada Bergeron, MD  Ascorbic Acid (VITAMIN C) 1000 MG tablet Take 1,000 mg by mouth daily.    [provider]  aspirin EC 81 MG tablet Take 1 tablet (81 mg total) by mouth daily. Swallow whole. 03/28/20 03/28/21  Wonda Olds, MD  atorvastatin (LIPITOR) 80 MG tablet Take 1 tablet (80 mg total) by mouth daily. 07/28/20   [name], MD  dapagliflozin propanediol (FARXIGA) 10 MG TABS tablet Take 1 tablet (10 mg total) by mouth daily before breakfast. 05/18/20   [name], MD  Glucagon (GVOKE HYPOPEN 2-PACK) 1 MG/0.2ML SOAJ Inject 1 Act into the skin  daily as needed. 05/22/20   [name], MD  isosorbide mononitrate (IMDUR) 30 MG 24 hr tablet Take 1 tablet (30 mg total) by mouth daily. 07/08/20 10/06/20  Freada Bergeron, MD  losartan (COZAAR) 50 MG tablet Take 1 tablet (50 mg total) by mouth daily. 05/17/20   Freada Bergeron, MD  Magnesium 300 MG CAPS Take 300 mg by mouth daily.    [provider]  metFORMIN (GLUCOPHAGE) 1000 MG tablet Take 1 tablet (1,000 mg total) by mouth 2 (two) times daily with a meal. 07/26/20   [name], MD  metoprolol succinate (TOPROL XL) 25 MG 24 hr tablet Take 1 tablet (25 mg total) by mouth daily. 03/13/20 03/13/21  Antony Odea, PA-C  Multiple Vitamin (MULTIVITAMIN WITH MINERALS) TABS tablet Take 1 tablet by mouth daily.    [provider]  Semaglutide (RYBELSUS) 3 MG TABS Take 3 mg by mouth daily. 07/26/20 08/25/20  [name], MD  traMADol (ULTRAM) 50 MG tablet Take 1 tablet (50 mg total) by mouth every 6 (six) hours as needed. 07/26/20 07/26/21  [name], MD  Vitamin D, Ergocalciferol, (DRISDOL) 1.25 MG (50000 UNIT) CAPS capsule Take 50,000 Units by mouth every 7 (seven) days.    [provider]    Allergies    Patient has no known allergies.  Review of Systems   Review of Systems  Constitutional:  Negative for fever.  HENT:  Negative for ear pain and sore throat.   Eyes:  Negative for pain.  Respiratory:  Negative for cough.   Cardiovascular:  Negative for chest pain.  Gastrointestinal:  Negative for abdominal pain.  Genitourinary:  Negative for flank pain.  Musculoskeletal:  Negative for back pain.  Skin:  Negative for color change and rash.  Neurological:  Negative for syncope.  All other systems reviewed and are negative.  Physical Exam Updated Vital Signs BP 125/86   Pulse 92   Temp 98.6 F (37 C) (Oral)   Resp (!) 24   SpO2 96%   Physical Exam Constitutional:      General: Colton Guzman is not in acute distress.    Appearance: Colton Guzman is  well-developed.  HENT:     Head: Normocephalic.     Nose: Nose normal.  Eyes:     Extraocular Movements: Extraocular movements intact.  Cardiovascular:     Rate and Rhythm: Normal rate.  Pulmonary:     Effort: Pulmonary effort is normal.  Genitourinary:    Comments: Dark blood seen on rectal exam.  Skin:    Coloration: Skin is not jaundiced.  Neurological:     Mental Status: Colton Guzman is alert. Mental status is at baseline.    ED Results / Procedures / Treatments   Labs (all labs ordered are listed, but only abnormal results are displayed) Labs Reviewed  CBC - Abnormal; Notable for the following components:      Result Value   WBC 15.3 (*)  All other components within normal limits  COMPREHENSIVE METABOLIC PANEL - Abnormal; Notable for the following components:   Sodium 132 (*)    CO2 20 (*)    Glucose, Bld 214 (*)    All other components within normal limits  RESP PANEL BY RT-PCR (FLU A&B, COVID) ARPGX2  TYPE AND SCREEN    EKG None  Radiology DG Chest Port 1 View  Result Date: 08/12/2020 CLINICAL DATA:  GI bleed. EXAM: PORTABLE CHEST 1 VIEW COMPARISON:  03/28/2020 FINDINGS: The cardiac silhouette, mediastinal and hilar contours is stable. The lungs are clear. No pleural effusions. No pulmonary lesions. The bony thorax is intact. IMPRESSION: No acute cardiopulmonary findings. Electronically Signed   By: Marijo Sanes M.D.   On: 08/12/2020 08:58    Procedures Procedures   Medications Ordered in ED Medications  pantoprazole (PROTONIX) injection 40 mg (has no administration in time range)    ED Course  I have reviewed the triage vital signs and the nursing notes.  Pertinent labs & imaging results that were available during my care of the patient were reviewed by me and considered in my medical decision making (see chart for details).    MDM Rules/Calculators/A&P                          Patient presents with recurrent GI bleed, on Eliquis with history of prior GI  bleed.  Vital signs show mild tachycardia blood pressure otherwise normal.  Hemoglobin is stable but appears to have dropped a unit from prior hemoglobin levels.  Given his use of Eliquis and ongoing GI bleed will be brought into the hospitalist team.  Final Clinical Impression(s) / ED Diagnoses Final diagnoses:  Gastrointestinal hemorrhage, unspecified gastrointestinal hemorrhage type    Rx / DC Orders ED Discharge Orders     None        Luna Fuse, MD 08/12/20 [phone]

## 2020-08-12 NOTE — Progress Notes (Addendum)
   08/12/20 1600  Level of Consciousness  Level of Consciousness Alert   Patient with 2 syncopal episodes while on bedside commode. Please see VS. Pt noted tachpnepic. MD aware of increase respiration. Patient had 2 episodes of large blood stools since arrival from ED. Patient wife at bedside. Please see new orders. Patient/wife aware of strict bedrest and POC. MD paged and aware of event.

## 2020-08-12 NOTE — ED Triage Notes (Signed)
Pt here from home with c/o GI bleed that started this morning , bright red blood , pt is on blood thinners

## 2020-08-12 NOTE — Consult Note (Addendum)
Consultation  Referring Provider: [facility]/ Tamala Julian Primary Care Physician:  [name], MD Primary Gastroenterologist:  none/ [facility] 1/22  Reason for Consultation:  GI bleed  HPI: Colton Guzman. is a 60 y.o. male, who we are asked to evaluate after he presented to the emergency room this morning with acute onset of large-volume bloody stools.  Patient says he was awakened from sleep with urge for bowel movement and then passed stool with dark blood.  He has had 5 more episodes since then and says that the stool is now all just bright red blood, his last episode was a large amount. He has no complaints of abdominal pain or cramping, he feels a bit weak but has not had any syncope or presyncope, no nausea or vomiting. Vitals stable on arrival though tachycardic.  Patient is on Eliquis for history of atrial fibrillation and last took a dose last night. Hemoglobin on admit 13.2, hemoglobin 2 weeks ago on 07/26/2020 was 14, hemoglobin March 2000 2212.5  Patient had an admission in January 2022 with acute lower GI bleed which was his initial episode, this was a major lower GI bleed with associated hypotension, he developed hemodynamic instability and had a V. tach arrest on 02/23/2020 and non-ST EMI. He underwent colonoscopy and EGD per Dr. Alessandra Bevels on 02/27/2020.  EGD was unremarkable with the exception of some minimal pyloric erosions and colonoscopy revealed blood throughout the colon with multiple clots and poor visualization.  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 He had had CT angiography on 02/22/2020 that showed some mild wall thickening in the descending colon and scattered diverticulosis  He underwent CABG x5 on 03/08/2020. He has history of hypertension, adult onset diabetes mellitus.  He did have atrial fibrillation during his admission in January and was discharged home on Eliquis and also taking baby aspirin.   Past Medical History:  Diagnosis Date  . CAD  (coronary artery disease) of artery bypass graft 02/2020  . Diabetes mellitus without complication ([place])   . Hypertension     Past Surgical History:  Procedure Laterality Date  . COLONOSCOPY WITH PROPOFOL Left 02/27/2020   Procedure: COLONOSCOPY WITH PROPOFOL;  Surgeon: Otis Brace, MD;  [facility];  Service: Gastroenterology;  Laterality: Left;  . CORONARY ARTERY BYPASS GRAFT N/A 03/08/2020   Procedure: CORONARY ARTERY BYPASS GRAFTING (CABG) x 5 ON PUMP;  Surgeon: Wonda Olds, MD;  Location: [place];  Service: Open Heart Surgery;  Laterality: N/A;  . ENDOVEIN HARVEST OF GREATER SAPHENOUS VEIN Right 03/08/2020   Procedure: ENDOVEIN HARVEST OF GREATER SAPHENOUS VEIN;  Surgeon: Wonda Olds, MD;  Location: [place];  Service: Open Heart Surgery;  Laterality: Right;  . ESOPHAGOGASTRODUODENOSCOPY (EGD) WITH PROPOFOL N/A 02/27/2020   Procedure: ESOPHAGOGASTRODUODENOSCOPY (EGD) WITH PROPOFOL;  Surgeon: Otis Brace, MD;  [facility];  Service: Gastroenterology;  Laterality: N/A;  . LEFT HEART CATH AND CORONARY ANGIOGRAPHY N/A 03/04/2020   Procedure: LEFT HEART CATH AND CORONARY ANGIOGRAPHY;  Surgeon: Burnell Blanks, MD;  Location: [place] [facility];  Service: Cardiovascular;  Laterality: N/A;  . RADIAL ARTERY HARVEST Left 03/08/2020   Procedure: RADIAL ARTERY HARVEST;  Surgeon: Wonda Olds, MD;  Location: [place];  Service: Open Heart Surgery;  Laterality: Left;  . TEE WITHOUT CARDIOVERSION N/A 03/08/2020   Procedure: TRANSESOPHAGEAL ECHOCARDIOGRAM (TEE);  Surgeon: Wonda Olds, MD;  Location: [place];  Service: Open Heart Surgery;  Laterality: N/A;    Prior to Admission medications  Medication Sig Start Date End Date Taking? Authorizing Provider  allopurinol (ZYLOPRIM) 100 MG tablet TAKE 1 TABLET BY MOUTH EVERY DAY 06/13/20   [name], MD  amLODipine (NORVASC) 10 MG tablet Take 10 mg by mouth daily.    [provider]  apixaban  (ELIQUIS) 5 MG TABS tablet Take 1 tablet (5 mg total) by mouth 2 (two) times daily. 06/13/20   Freada Bergeron, MD  Ascorbic Acid (VITAMIN C) 1000 MG tablet Take 1,000 mg by mouth daily.    [provider]  aspirin EC 81 MG tablet Take 1 tablet (81 mg total) by mouth daily. Swallow whole. 03/28/20 03/28/21  Wonda Olds, MD  atorvastatin (LIPITOR) 80 MG tablet Take 1 tablet (80 mg total) by mouth daily. 07/28/20   [name], MD  dapagliflozin propanediol (FARXIGA) 10 MG TABS tablet Take 1 tablet (10 mg total) by mouth daily before breakfast. 05/18/20   [name], MD  Glucagon (GVOKE HYPOPEN 2-PACK) 1 MG/0.2ML SOAJ Inject 1 Act into the skin daily as needed. 05/22/20   [name], MD  isosorbide mononitrate (IMDUR) 30 MG 24 hr tablet Take 1 tablet (30 mg total) by mouth daily. 07/08/20 10/06/20  Freada Bergeron, MD  losartan (COZAAR) 50 MG tablet Take 1 tablet (50 mg total) by mouth daily. 05/17/20   Freada Bergeron, MD  Magnesium 300 MG CAPS Take 300 mg by mouth daily.    [provider]  metFORMIN (GLUCOPHAGE) 1000 MG tablet Take 1 tablet (1,000 mg total) by mouth 2 (two) times daily with a meal. 07/26/20   [name], MD  metoprolol succinate (TOPROL XL) 25 MG 24 hr tablet Take 1 tablet (25 mg total) by mouth daily. 03/13/20 03/13/21  Antony Odea, PA-C  Multiple Vitamin (MULTIVITAMIN WITH MINERALS) TABS tablet Take 1 tablet by mouth daily.    [provider]  Semaglutide (RYBELSUS) 3 MG TABS Take 3 mg by mouth daily. 07/26/20 08/25/20  [name], MD  traMADol (ULTRAM) 50 MG tablet Take 1 tablet (50 mg total) by mouth every 6 (six) hours as needed. 07/26/20 07/26/21  [name], MD  Vitamin D, Ergocalciferol, (DRISDOL) 1.25 MG (50000 UNIT) CAPS capsule Take 50,000 Units by mouth every 7 (seven) days.    [provider]    Current Facility-Administered Medications  Medication Dose Route Frequency Provider Last Rate Last Admin   . acetaminophen (TYLENOL) tablet 650 mg  650 mg Oral Q6H PRN Norval Morton, MD       Or  . acetaminophen (TYLENOL) suppository 650 mg  650 mg Rectal Q6H PRN Smith, Rondell A, MD      . albuterol (PROVENTIL) (2.5 MG/3ML) 0.083% nebulizer solution 2.5 mg  2.5 mg Nebulization Q6H PRN Tamala Julian, Rondell A, MD      . ondansetron (ZOFRAN) tablet 4 mg  4 mg Oral Q6H PRN Fuller Plan A, MD       Or  . ondansetron (ZOFRAN) injection 4 mg  4 mg Intravenous Q6H PRN Smith, Rondell A, MD      . pantoprazole (PROTONIX) injection 40 mg  40 mg Intravenous BID Smith, Rondell A, MD      . sodium chloride flush (NS) 0.9 % injection 3 mL  3 mL Intravenous Q12H Smith, Rondell A, MD   3 mL at 08/12/20 1024   Current Outpatient Medications  Medication Sig Dispense Refill  . allopurinol (ZYLOPRIM) 100 MG tablet TAKE 1 TABLET BY  MOUTH EVERY DAY 90 tablet 0  . amLODipine (NORVASC) 10 MG tablet Take 10 mg by mouth daily.    Marland Kitchen apixaban (ELIQUIS) 5 MG TABS tablet Take 1 tablet (5 mg total) by mouth 2 (two) times daily. 60 tablet 5  . Ascorbic Acid (VITAMIN C) 1000 MG tablet Take 1,000 mg by mouth daily.    Marland Kitchen aspirin EC 81 MG tablet Take 1 tablet (81 mg total) by mouth daily. Swallow whole. 150 tablet 2  . atorvastatin (LIPITOR) 80 MG tablet Take 1 tablet (80 mg total) by mouth daily. 90 tablet 1  . dapagliflozin propanediol (FARXIGA) 10 MG TABS tablet Take 1 tablet (10 mg total) by mouth daily before breakfast. 90 tablet 1  . Glucagon (GVOKE HYPOPEN 2-PACK) 1 MG/0.2ML SOAJ Inject 1 Act into the skin daily as needed. 2 mL 5  . isosorbide mononitrate (IMDUR) 30 MG 24 hr tablet Take 1 tablet (30 mg total) by mouth daily. 90 tablet 3  . losartan (COZAAR) 50 MG tablet Take 1 tablet (50 mg total) by mouth daily. 90 tablet 3  . Magnesium 300 MG CAPS Take 300 mg by mouth daily.    . metFORMIN (GLUCOPHAGE) 1000 MG tablet Take 1 tablet (1,000 mg total) by mouth 2 (two) times daily with a meal. 180 tablet 1  . metoprolol  succinate (TOPROL XL) 25 MG 24 hr tablet Take 1 tablet (25 mg total) by mouth daily. 30 tablet 11  . Multiple Vitamin (MULTIVITAMIN WITH MINERALS) TABS tablet Take 1 tablet by mouth daily.    . Semaglutide (RYBELSUS) 3 MG TABS Take 3 mg by mouth daily. 30 tablet 0  . traMADol (ULTRAM) 50 MG tablet Take 1 tablet (50 mg total) by mouth every 6 (six) hours as needed. 90 tablet 3  . Vitamin D, Ergocalciferol, (DRISDOL) 1.25 MG (50000 UNIT) CAPS capsule Take 50,000 Units by mouth every 7 (seven) days.      Allergies as of 08/12/2020  . (No Known Allergies)    No family history on file.  Social History   Socioeconomic History  . Marital status: Married    Spouse name: Not on file  . Number of children: Not on file  . Years of education: Not on file  . Highest education level: Not on file  Occupational History  . Not on file  Tobacco Use  . Smoking status: Never  . Smokeless tobacco: Never  Substance and Sexual Activity  . Alcohol use: Yes  . Drug use: Not on file  . Sexual activity: Not on file  Other Topics Concern  . Not on file  Social History Narrative  . Not on file   Social Determinants of Health   Financial Resource Strain: Not on file  Food Insecurity: Not on file  Transportation Needs: Not on file  Physical Activity: Not on file  Stress: Not on file  Social Connections: Not on file  Intimate Partner Violence: Not on file    Review of Systems: Pertinent positive and negative review of systems were noted in the above HPI section.  All other review of systems was otherwise negative.   Physical Exam: Vital signs in last 24 hours: Temp:  [98.6 F (37 C)] 98.6 F (37 C) (06/24 0646) Pulse Rate:  [92-109] 101 (06/24 1015) Resp:  [20-26] 26 (06/24 1015) BP: (120-132)/(78-103) 123/88 (06/24 1015) SpO2:  [94 %-98 %] 97 % (06/24 1015)   General:   Alert,  Well-developed, well-nourished, male pleasant and cooperative in NAD Wife  at bedside Head:  Normocephalic and  atraumatic. Eyes:  Sclera clear, no icterus.   Conjunctiva pink. Ears:  Normal auditory acuity. Nose:  No deformity, discharge,  or lesions. Mouth:  No deformity or lesions.   Neck:  Supple; no masses or thyromegaly. Lungs:  Clear throughout to auscultation.   No wheezes, crackles, or rhonchi. Heart:  tachyRegular rate and rhythm; no murmurs, clicks, rubs,  or gallops. Abdomen:  Soft,nontender, BS active,nonpalp mass or hsm.   Rectal: Not done Msk:  Symmetrical without gross deformities. . Pulses:  Normal pulses noted. Extremities:  Without clubbing or edema. Neurologic:  Alert and  oriented x4;  grossly normal neurologically. Skin:  Intact without significant lesions or rashes.. Psych:  Alert and cooperative. Normal mood and affect.  Intake/Output from previous day: No intake/output data recorded. Intake/Output this shift: No intake/output data recorded.  Lab Results: Recent Labs    08/12/20 0727  WBC 15.3*  HGB 13.2  HCT 40.5  PLT 181   BMET Recent Labs    08/12/20 0727  NA 132*  K 4.0  CL 101  CO2 20*  GLUCOSE 214*  BUN 13  CREATININE 0.74  CALCIUM 9.0   LFT Recent Labs    08/12/20 0727  PROT 7.9  ALBUMIN 4.1  AST 22  ALT 27  ALKPHOS 70  BILITOT 1.0      IMPRESSION:   #60 60 year old male with acute recurrent lower GI bleed, onset this a.m. setting of Eliquis and aspirin.  Patient has had 6 episodes of fairly large volume bright red blood per rectum.  On arrival hemodynamically stable and hemoglobin is 13.2.  Patient had a major GI bleed January 2022 felt to be diverticular in etiology, though at colonoscopy had a lot of red blood in the colon with poor visualization of the negative EGD.  Patient had associated V. tach arrest, non-STEMI.  #2 coronary artery disease status post CABG times 24 February 2020 #3 atrial fibrillation-on Eliquis and aspirin #4 adult onset diabetes mellitus #5 hypertension #6 hyperlipidemia  Plan; n.p.o., Hold  Eliquis Serial hemoglobins every 6 hours and transfuse as indicated Place second IV and start IV fluids Will send patient for stat CT angiography abdomen pelvis and if evidence of active bleed would hope to proceed to IR for embolization  Will also ask for pharmacy consultation regarding potential reversal of Eliquis with Andexxa  GI will follow closely with you  Addendum-3:30 PM-CT angio does not show any evidence of active hemorrhage, coloiculosis, hepatic steatosis, cholelithiasis, there is a large left inguinal hernia containing a large portion of the sigmoid colon.  Last hemoglobin 12  Patient did receive Kcentra per pharmacy  He has had 1 more large bloody stool about 1 hour ago  Continue close observation, clear liquid diet. Colonoscopy in January with large amount of blood in the colon obscuring good visualization, for that reason we will plan to proceed with repeat colonoscopy this admission once he has stopped bleeding and can prep bowel, probably Sunday  Radiology findings, labs and plan discussed with patient and wife at bedsidenic divert  [facility]  08/12/2020, 10:51 AM     [facility] Attending   I have taken an interval history, reviewed the chart and examined the patient. I agree with the Advanced Practitioner's note, impression and recommendations.    CBC Latest Ref Rng & Units 08/12/2020 08/12/2020 07/26/2020  WBC 4.0 - 10.5 K/uL - 15.3(H) 8.8  Hemoglobin 13.0 - 17.0 g/dL 12.0(L) 13.2 14.0  Hematocrit  39.0 - 52.0 % 36.3(L) 40.5 42.7  Platelets 150 - 400 K/uL - 181 203.0    I have seen the patient with the hospitalist Dr. Tamala Julian the nurse present.  Patient's wife is understandably concerned because he continues to have hematochezia.  Hemoglobin down to 12.  Blood pressures are soft he is a bit tachycardic.  We will give fluid bolus and I think given his prior history and what is going on 1 unit of blood is reasonable.  He is being moved to progressive  care.   He will be prepped for colonoscopy tomorrow.  I have explained to the wife that we are doing everything we can at this point and that this is again a likely diverticular hemorrhage but I clearly want to look and see if there is something else that could be causing this that could be more treatable if you well.   Gatha Mayer, MD, [facility] 08/12/2020 5:31 PM

## 2020-08-13 ENCOUNTER — Observation Stay (HOSPITAL_COMMUNITY): Payer: Managed Care, Other (non HMO) | Admitting: Anesthesiology

## 2020-08-13 ENCOUNTER — Encounter (HOSPITAL_COMMUNITY)
Admission: EM | Disposition: A | Discharge status: Home / Self Care | Payer: Self-pay | Source: Home / Self Care | Attending: Internal Medicine

## 2020-08-13 DIAGNOSIS — K5730 Diverticulosis of large intestine without perforation or abscess without bleeding: Secondary | ICD-10-CM

## 2020-08-13 DIAGNOSIS — K922 Gastrointestinal hemorrhage, unspecified: Secondary | ICD-10-CM | POA: Diagnosis present

## 2020-08-13 DIAGNOSIS — K259 Gastric ulcer, unspecified as acute or chronic, without hemorrhage or perforation: Secondary | ICD-10-CM | POA: Diagnosis not present

## 2020-08-13 DIAGNOSIS — D62 Acute posthemorrhagic anemia: Secondary | ICD-10-CM | POA: Diagnosis present

## 2020-08-13 DIAGNOSIS — K921 Melena: Secondary | ICD-10-CM | POA: Diagnosis not present

## 2020-08-13 DIAGNOSIS — I480 Paroxysmal atrial fibrillation: Secondary | ICD-10-CM | POA: Diagnosis present

## 2020-08-13 DIAGNOSIS — I2510 Atherosclerotic heart disease of native coronary artery without angina pectoris: Secondary | ICD-10-CM | POA: Diagnosis present

## 2020-08-13 DIAGNOSIS — M1990 Unspecified osteoarthritis, unspecified site: Secondary | ICD-10-CM | POA: Diagnosis present

## 2020-08-13 DIAGNOSIS — D72829 Elevated white blood cell count, unspecified: Secondary | ICD-10-CM | POA: Diagnosis present

## 2020-08-13 DIAGNOSIS — K4090 Unilateral inguinal hernia, without obstruction or gangrene, not specified as recurrent: Secondary | ICD-10-CM | POA: Diagnosis present

## 2020-08-13 DIAGNOSIS — K5731 Diverticulosis of large intestine without perforation or abscess with bleeding: Secondary | ICD-10-CM | POA: Diagnosis present

## 2020-08-13 DIAGNOSIS — I252 Old myocardial infarction: Secondary | ICD-10-CM | POA: Diagnosis not present

## 2020-08-13 DIAGNOSIS — Z951 Presence of aortocoronary bypass graft: Secondary | ICD-10-CM | POA: Diagnosis not present

## 2020-08-13 DIAGNOSIS — K254 Chronic or unspecified gastric ulcer with hemorrhage: Secondary | ICD-10-CM | POA: Diagnosis present

## 2020-08-13 DIAGNOSIS — E876 Hypokalemia: Secondary | ICD-10-CM | POA: Diagnosis present

## 2020-08-13 DIAGNOSIS — Z8674 Personal history of sudden cardiac arrest: Secondary | ICD-10-CM | POA: Diagnosis not present

## 2020-08-13 DIAGNOSIS — E669 Obesity, unspecified: Secondary | ICD-10-CM | POA: Diagnosis present

## 2020-08-13 DIAGNOSIS — E785 Hyperlipidemia, unspecified: Secondary | ICD-10-CM | POA: Diagnosis present

## 2020-08-13 DIAGNOSIS — Z6826 Body mass index (BMI) 26.0-26.9, adult: Secondary | ICD-10-CM | POA: Diagnosis not present

## 2020-08-13 DIAGNOSIS — E1165 Type 2 diabetes mellitus with hyperglycemia: Secondary | ICD-10-CM | POA: Diagnosis present

## 2020-08-13 DIAGNOSIS — Z79899 Other long term (current) drug therapy: Secondary | ICD-10-CM | POA: Diagnosis not present

## 2020-08-13 DIAGNOSIS — Z20822 Contact with and (suspected) exposure to covid-19: Secondary | ICD-10-CM | POA: Diagnosis present

## 2020-08-13 DIAGNOSIS — I10 Essential (primary) hypertension: Secondary | ICD-10-CM | POA: Diagnosis present

## 2020-08-13 DIAGNOSIS — Z7901 Long term (current) use of anticoagulants: Secondary | ICD-10-CM | POA: Diagnosis not present

## 2020-08-13 DIAGNOSIS — Z7982 Long term (current) use of aspirin: Secondary | ICD-10-CM | POA: Diagnosis not present

## 2020-08-13 DIAGNOSIS — R55 Syncope and collapse: Secondary | ICD-10-CM | POA: Diagnosis present

## 2020-08-13 DIAGNOSIS — Z7984 Long term (current) use of oral hypoglycemic drugs: Secondary | ICD-10-CM | POA: Diagnosis not present

## 2020-08-13 HISTORY — PX: COLONOSCOPY WITH PROPOFOL: SHX5780

## 2020-08-13 LAB — BASIC METABOLIC PANEL
Anion gap: 9 (ref 5–15)
BUN: 13 mg/dL (ref 6–20)
CO2: 23 mmol/L (ref 22–32)
Calcium: 8.2 mg/dL — ABNORMAL LOW (ref 8.9–10.3)
Chloride: 102 mmol/L (ref 98–111)
Creatinine, Ser: 0.73 mg/dL (ref 0.61–1.24)
GFR, Estimated: >60 mL/min (ref >60)
Glucose, Bld: 214 mg/dL — ABNORMAL HIGH (ref 70–99)
Potassium: 3.7 mmol/L (ref 3.5–5.1)
Sodium: 134 mmol/L — ABNORMAL LOW (ref 135–145)

## 2020-08-13 LAB — CBC
HCT: 26 % — ABNORMAL LOW (ref 39.0–52.0)
HCT: 24.2 % — ABNORMAL LOW (ref 39.0–52.0)
Hemoglobin: 8.8 g/dL — ABNORMAL LOW (ref 13.0–17.0)
Hemoglobin: 8.4 g/dL — ABNORMAL LOW (ref 13.0–17.0)
MCH: 29.2 pg (ref 26.0–34.0)
MCH: 29.8 pg (ref 26.0–34.0)
MCHC: 33.8 g/dL (ref 30.0–36.0)
MCHC: 34.7 g/dL (ref 30.0–36.0)
MCV: 86.4 fL (ref 80.0–100.0)
MCV: 85.8 fL (ref 80.0–100.0)
Platelets: 139 10*3/uL — ABNORMAL LOW (ref 150–400)
Platelets: 115 K/uL — ABNORMAL LOW (ref 150–400)
RBC: 3.01 MIL/uL — ABNORMAL LOW (ref 4.22–5.81)
RBC: 2.82 MIL/uL — ABNORMAL LOW (ref 4.22–5.81)
RDW: 16.1 % — ABNORMAL HIGH (ref 11.5–15.5)
RDW: 15.3 % (ref 11.5–15.5)
WBC: 12.4 10*3/uL — ABNORMAL HIGH (ref 4.0–10.5)
WBC: 9.8 K/uL (ref 4.0–10.5)
nRBC: 0 % (ref 0.0–0.2)
nRBC: 0 % (ref 0.0–0.2)

## 2020-08-13 LAB — GLUCOSE, CAPILLARY
Glucose-Capillary: 194 mg/dL — ABNORMAL HIGH (ref 70–99)
Glucose-Capillary: 220 mg/dL — ABNORMAL HIGH (ref 70–99)
Glucose-Capillary: 221 mg/dL — ABNORMAL HIGH (ref 70–99)
Glucose-Capillary: 203 mg/dL — ABNORMAL HIGH (ref 70–99)
Glucose-Capillary: 315 mg/dL — ABNORMAL HIGH (ref 70–99)
Glucose-Capillary: 280 mg/dL — ABNORMAL HIGH (ref 70–99)

## 2020-08-13 LAB — PREPARE RBC (CROSSMATCH)

## 2020-08-13 LAB — HEMOGLOBIN AND HEMATOCRIT, BLOOD
HCT: 24 % — ABNORMAL LOW (ref 39.0–52.0)
Hemoglobin: 7.8 g/dL — ABNORMAL LOW (ref 13.0–17.0)

## 2020-08-13 SURGERY — COLONOSCOPY WITH PROPOFOL
Anesthesia: Monitor Anesthesia Care

## 2020-08-13 MED ORDER — METOPROLOL SUCCINATE ER 25 MG PO TB24
25.0000 mg | ORAL_TABLET | Freq: Every day | ORAL | Status: DC
  Administered 2020-08-13 – 2020-08-18 (×6): 25 mg via ORAL
  Filled 2020-08-13 (×6): qty 1

## 2020-08-13 MED ORDER — TRAMADOL HCL 50 MG PO TABS: 50.0000 mg | ORAL_TABLET | Freq: Four times a day (QID) | ORAL | Status: DC | PRN

## 2020-08-13 MED ORDER — SODIUM CHLORIDE 0.9% IV SOLUTION: Freq: Once | INTRAVENOUS | Status: AC

## 2020-08-13 MED ORDER — PHENYLEPHRINE HCL (PRESSORS) 10 MG/ML IV SOLN
INTRAVENOUS | Status: DC | PRN
  Administered 2020-08-13: 100 ug via INTRAVENOUS

## 2020-08-13 MED ORDER — EPINEPHRINE 1 MG/10ML IJ SOSY
PREFILLED_SYRINGE | INTRAMUSCULAR | Status: AC
  Filled 2020-08-13: qty 10

## 2020-08-13 MED ORDER — LACTATED RINGERS IV SOLN: INTRAVENOUS | Status: DC | PRN

## 2020-08-13 MED ORDER — FUROSEMIDE 10 MG/ML IJ SOLN
20.0000 mg | Freq: Once | INTRAMUSCULAR | Status: AC
  Administered 2020-08-13: 20 mg via INTRAVENOUS
  Filled 2020-08-13: qty 2

## 2020-08-13 MED ORDER — PROPOFOL 500 MG/50ML IV EMUL
INTRAVENOUS | Status: DC | PRN
  Administered 2020-08-13: 100 ug/kg/min via INTRAVENOUS

## 2020-08-13 MED ORDER — LACTATED RINGERS IV BOLUS
1000.0000 mL | Freq: Once | INTRAVENOUS | Status: AC
  Administered 2020-08-13: 1000 mL via INTRAVENOUS

## 2020-08-13 MED ORDER — FUROSEMIDE 10 MG/ML IJ SOLN: 20.0000 mg | Freq: Once | INTRAMUSCULAR | Status: DC

## 2020-08-13 MED ORDER — ONDANSETRON HCL 4 MG/2ML IJ SOLN
INTRAMUSCULAR | Status: DC | PRN
  Administered 2020-08-13: 4 mg via INTRAVENOUS

## 2020-08-13 MED ORDER — PROPOFOL 10 MG/ML IV BOLUS
INTRAVENOUS | Status: DC | PRN
  Administered 2020-08-13: 30 mg via INTRAVENOUS

## 2020-08-13 MED ORDER — LACTATED RINGERS IV SOLN: INTRAVENOUS | Status: DC

## 2020-08-13 MED ORDER — MAGNESIUM 300 MG PO CAPS: 300.0000 mg | ORAL_CAPSULE | Freq: Every day | ORAL | Status: DC

## 2020-08-13 MED ORDER — MAGNESIUM OXIDE -MG SUPPLEMENT 400 (240 MG) MG PO TABS
200.0000 mg | ORAL_TABLET | Freq: Every day | ORAL | Status: DC
  Administered 2020-08-13 – 2020-08-18 (×6): 200 mg via ORAL
  Filled 2020-08-13 (×6): qty 1

## 2020-08-13 MED ORDER — NITROGLYCERIN 0.4 MG SL SUBL: 0.4000 mg | SUBLINGUAL_TABLET | SUBLINGUAL | Status: DC | PRN

## 2020-08-13 MED ORDER — LACTATED RINGERS IV BOLUS: 1000.0000 mL | Freq: Once | INTRAVENOUS | Status: DC

## 2020-08-13 MED ORDER — ISOSORBIDE MONONITRATE ER 30 MG PO TB24
15.0000 mg | ORAL_TABLET | Freq: Every day | ORAL | Status: DC
  Administered 2020-08-13 – 2020-08-18 (×6): 15 mg via ORAL
  Filled 2020-08-13 (×7): qty 1

## 2020-08-13 SURGICAL SUPPLY — 22 items

## 2020-08-13 NOTE — Progress Notes (Signed)
Progress Note  Patient Name: Colton Guzman [facility]. Date of Encounter: 08/13/2020  [facility] Cardiologist: Freada Bergeron, MD   Subjective   Back from colonoscopy this AM. No complaints. HD stable.  Inpatient Medications    Scheduled Meds:  atorvastatin  80 mg Oral Daily   insulin aspart  0-15 Units Subcutaneous TID WC   insulin aspart  0-5 Units Subcutaneous QHS   isosorbide mononitrate  15 mg Oral Daily   magnesium oxide  200 mg Oral Daily   metoprolol succinate  25 mg Oral Daily   pantoprazole (PROTONIX) IV  40 mg Intravenous BID   sodium chloride flush  3 mL Intravenous Q12H   Continuous Infusions:  lactated ringers     PRN Meds: acetaminophen **OR** acetaminophen, albuterol, nitroGLYCERIN, ondansetron **OR** ondansetron (ZOFRAN) IV, traMADol   Vital Signs    Vitals:   08/13/20 0902 08/13/20 0915 08/13/20 0930 08/13/20 0951  BP: 118/79 (!) 112/49 113/70 (!) 117/55  Pulse: 95 88 87 91  Resp: 17 (!) 25 (!) 24 (!) 21  Temp: 98.8 F (37.1 C)  98.7 F (37.1 C) 98.3 F (36.8 C)  TempSrc:    Oral  SpO2: 100% 98% 97% 97%  Weight:      Height:        Intake/Output Summary (Last 24 hours) at 08/13/2020 1038 Last data filed at 08/13/2020 0900 Gross per 24 hour  Intake 2291.24 ml  Output 801 ml  Net 1490.24 ml   Last 3 Weights 08/13/2020 08/12/2020 08/03/2020  Weight (lbs) 177 lb 4 oz 177 lb 4 oz 180 lb  Weight (kg) 80.4 kg 80.4 kg 81.647 kg      Telemetry    sinus - Personally Reviewed  ECG    No new - Personally Reviewed  Physical Exam   GEN: No acute distress.   Neck: No JVD Cardiac: RRR, no murmurs, rubs, or gallops.  Respiratory: Clear to auscultation bilaterally. GI: Soft, nontender, non-distended  MS: No edema; No deformity. Neuro:  Nonfocal  Psych: Normal affect   Labs    High Sensitivity Troponin:  No results for input(s): TROPONINIHS in the last 720 hours.    Chemistry Recent Labs  Lab 08/12/20 0727 08/13/20 0038  NA  132* 134*  K 4.0 3.7  CL 101 102  CO2 20* 23  GLUCOSE 214* 214*  BUN 13 13  CREATININE 0.74 0.73  CALCIUM 9.0 8.2*  PROT 7.9  --   ALBUMIN 4.1  --   AST 22  --   ALT 27  --   ALKPHOS 70  --   BILITOT 1.0  --   GFRNONAA >60 >60  ANIONGAP 11 9     Hematology Recent Labs  Lab 08/12/20 0727 08/12/20 1200 08/12/20 1747 08/12/20 2226 08/13/20 0038  WBC 15.3*  --  13.3*  --  12.4*  RBC 4.51  --  3.35*  --  3.01*  HGB 13.2   < > 9.8*  9.8* 9.1* 8.8*  HCT 40.5   < > 30.2*  30.7* 27.6* 26.0*  MCV 89.8  --  91.6  --  86.4  MCH 29.3  --  29.3  --  29.2  MCHC 32.6  --  31.9  --  33.8  RDW 15.5  --  15.7*  --  16.1*  PLT 181  --  182  --  139*   < > = values in this interval not displayed.    BNPNo results for input(s): BNP, PROBNP  in the last 168 hours.   DDimer No results for input(s): DDIMER in the last 168 hours.   Radiology    DG Chest Port 1 View  Result Date: 08/12/2020 CLINICAL DATA:  GI bleed. EXAM: PORTABLE CHEST 1 VIEW COMPARISON:  03/28/2020 FINDINGS: The cardiac silhouette, mediastinal and hilar contours is stable. The lungs are clear. No pleural effusions. No pulmonary lesions. The bony thorax is intact. IMPRESSION: No acute cardiopulmonary findings. Electronically Signed   By: Marijo Sanes M.D.   On: 08/12/2020 08:58   CT Angio Abd/Pel w/ and/or w/o  Result Date: 08/12/2020 CLINICAL DATA:  60 year old with melena. Evaluate for lower GI bleed. EXAM: CTA ABDOMEN AND PELVIS WITHOUT AND WITH CONTRAST TECHNIQUE: Multidetector CT imaging of the abdomen and pelvis was performed using the standard protocol during bolus administration of intravenous contrast. Multiplanar reconstructed images and MIPs were obtained and reviewed to evaluate the vascular anatomy. CONTRAST:  146m OMNIPAQUE IOHEXOL 350 MG/ML SOLN COMPARISON:  02/22/2020 FINDINGS: VASCULAR Aorta: Mild atherosclerotic disease in the in the abdominal aorta without aneurysm, dissection or stenosis. Celiac: Patent  without evidence of aneurysm, dissection, vasculitis or significant stenosis. SMA: Patent without evidence of aneurysm, dissection, vasculitis or significant stenosis. Renals: Both renal arteries are patent without evidence of aneurysm, dissection, vasculitis, fibromuscular dysplasia or significant stenosis. IMA: Patent without evidence of aneurysm, dissection, vasculitis or significant stenosis. Inflow: Patent without evidence of aneurysm, dissection, vasculitis or significant stenosis. Proximal Outflow: Proximal femoral arteries are patent bilaterally. Veins: Portal venous system is patent. No gross abnormality to the IVC or renal veins. No gross abnormality to the iliac veins. Review of the MIP images confirms the above findings. NON-VASCULAR Lower chest: Prior median sternotomy.  Lung bases are clear. Hepatobiliary: Diffuse low-attenuation of the liver is suggestive for steatosis. No focal liver lesion. Multiple gallstones without gallbladder inflammation or distension. No significant biliary dilatation. Pancreas: Unremarkable. No pancreatic ductal dilatation or surrounding inflammatory changes. Spleen: Normal in size without focal abnormality. Adrenals/Urinary Tract: Adrenal glands are unremarkable. Kidneys are normal, without renal calculi, focal lesion, or hydronephrosis. Bladder is unremarkable. Stomach/Bowel: Normal appearance of the stomach. Normal appearance of small bowel with focal bowel inflammation or obstruction. Normal appendix. Scattered colonic diverticula without acute inflammation. Again noted is large left inguinal hernia containing portion of the sigmoid colon. There is no evidence for wall thickening or inflammation associated with this large inguinal hernia. No evidence for active GI bleeding. Lymphatic: No lymphadenopathy in the abdomen or pelvis. Reproductive: Prostate is unremarkable. Other: Negative for ascites. Large left inguinal hernia containing sigmoid colon but no inflammatory  changes. A tiny umbilical hernia containing fat. Fat containing right inguinal hernia. Musculoskeletal: No acute bone abnormality. Degenerative facet disease in the lower lumbar spine. IMPRESSION: VASCULAR 1. No evidence for active GI bleeding. 2. Mild atherosclerotic disease in the abdominal aorta. Aortic Atherosclerosis (ICD10-I70.0). 3. Main visceral arteries are patent. NON-VASCULAR 1. No acute abnormalities in the abdomen or pelvis. 2. Again noted is a large left inguinal hernia containing a large portion of the sigmoid colon. No acute inflammatory changes associated with this hernia and no evidence for bowel obstruction. 3. Colonic diverticulosis. 4. Cholelithiasis. 5. Hepatic steatosis. Electronically Signed   By: AMarkus DaftM.D.   On: 08/12/2020 14:12    Cardiac Studies   No new    Assessment & Plan    Mr SCatais a 89yoman with CAD s/p CABG, pAF previously on eliquis, multiple prior GI bleeds requiring transfusion/hospitalization who presented for GI  bleed. Today he underwent colonoscopy which did not identify a clear culprit amenable to intervention.  #pAF Sinus today. CHADSVASc of 4 (HTN, DM, CAD, HF) gives annual stroke risk of 4.8% Previously on apixaban but has experienced recurrent GI bleeding while on the [facility]. For now, we will have to hold this medication given bleeding. Will need to consider the possibility of left atrial appendage occlusion in the future to help address the AF related stroke risk while avoiding long term exposure to [facility]. I will set him up for an outpatient appointment to discuss.   #GI bleed Continue to monitor H/H off AC  #CAD s/p CABG No ischemic symptoms Restart aspirin '81mg'$  PO daily when felt safe from a GI perspective Continue statin   For questions or updates, please contact [place] Please consult [email or web address] for contact info under        Signed, Vickie Epley, MD  08/13/2020, 10:38 AM

## 2020-08-13 NOTE — Progress Notes (Signed)
PROGRESS NOTE                                                                                                                                                                                                             Patient Demographics:    Colton Guzman, is a 60 y.o. male, DOB - 09-Aug-1960, [id number]  Outpatient Primary MD for the patient is [name], MD    LOS - 0  Admit date - 08/12/2020    CC - Blood in stool     Brief Narrative (HPI from H&P)  - Parks Colton Guzman. is a 60 y.o. 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   Subjective:    [employer] today has, No headache, No chest pain, No abdominal pain - No Nausea, No new weakness tingling or numbness, no SOB   Assessment  & Plan :     Acute L.GIB POA likely diverticular - he is getting 1 unit of packed RBC transfused due to rapidly declining H&H, continue to monitor CBC, he was on anticoagulation for A. fib which has been reversed, currently bowel rest, PPI, GI on board will get colonoscopy this admission.  2. Acute lower GI bleed related symptomatic anemia.  Transfuse and as in #1 above.  3.  Syncope from #1 above.  Stable.  4.  CAD s/p CABG and V. tach arrest.  Currently on statin for secondary prevention, aspirin and Eliquis held for GI bleed.  Once blood pressure improves low-dose beta-blocker.  5.  Paroxysmal A. fib [place] vas 2 score of greater than 2.  Was on Eliquis which is held and reversed with Kcentra this admission due to GI bleed.  Resume once he is stable and cleared by GI.  6.  Dyslipidemia.  On  statin.  7.  Essential hypertension.  Low-dose beta-blocker once blood pressure stabilizes.  8.  DM type II.  Sliding scale for now.  Recent A1c was 8.3.  Outpatient follow-up with PCP for glycemic control post  discharge.  Lab Results  Component Value Date   HGBA1C 8.3 (H) 07/26/2020   CBG (last 3)  Recent Labs    08/12/20 2033 08/12/20 2221 08/13/20 0753  GLUCAP 309* 248* 194*       Obesity: follow with PCP Estimated body mass index is 26.95 kg/m as calculated from the following:   Height as of this encounter: '5\' 8"'$  (1.727 m).   Weight as of this encounter: 80.4 kg.          Condition - Fair  Family Communication  :  wife bedside  Code Status :  Full  Consults  :  GI  PUD Prophylaxis : PPI   Procedures  :     CT - VASCULAR 1. No evidence for active GI bleeding. 2. Mild atherosclerotic disease in the abdominal aorta. Aortic Atherosclerosis (ICD10-I70.0). 3. Main visceral arteries are patent. NON-VASCULAR 1. No acute abnormalities in the abdomen or pelvis. 2. Again noted is a large left inguinal hernia containing a large portion of the sigmoid colon. No acute inflammatory changes associated with this hernia and no evidence for bowel obstruction. 3. Colonic diverticulosis. 4. Cholelithiasis. 5. Hepatic steatosis.      Disposition Plan  :    Status is: Inpatient  Remains inpatient appropriate because:IV treatments appropriate due to intensity of illness or inability to take PO  Dispo: The patient is from: Home              Anticipated d/c is to: Home              Patient currently is not medically stable to d/c.   Difficult to place patient No  DVT Prophylaxis  :    SCDs Start: 08/12/20 1012  Lab Results  Component Value Date   PLT 139 (L) 08/13/2020    Diet :  Diet Order             Diet NPO time specified Except for: Sips with Meds  Diet effective midnight                    Inpatient Medications  Scheduled Meds:  [MAR Hold]  atorvastatin  80 mg Oral Daily   [MAR Hold] insulin aspart  0-15 Units Subcutaneous TID WC   [MAR Hold] insulin aspart  0-5 Units Subcutaneous QHS   [MAR Hold] pantoprazole (PROTONIX) IV  40 mg Intravenous BID   [MAR Hold] sodium chloride flush  3 mL Intravenous Q12H   Continuous Infusions:  lactated ringers 100 mL/hr at 08/13/20 0027   PRN Meds:.[MAR Hold] acetaminophen **OR** [MAR Hold] acetaminophen, [MAR Hold] albuterol, [MAR Hold] ondansetron **OR** [MAR Hold] ondansetron (ZOFRAN) IV, [MAR Hold] traMADol  Antibiotics  :    Anti-infectives (From admission, onward)    None        Time Spent in minutes  30   Lala Lund M.D on 08/13/2020 at 8:32 AM  To page go to [email or web address]   [facility] -  Office  [phone]    See all Orders from today for further details    Objective:   Vitals:   08/13/20 0457 08/13/20 0500 08/13/20 0540 08/13/20 0745  BP: 113/73 123/78 102/69 110/70  Pulse: 98 95 94 (!) 101  Resp: 20 (!) 21 (!) 23 15  Temp: 97.8 F (36.6 C) 99.5 F (37.5 C) 97.9 F (36.6 C) 98.5 F (36.9 C)  TempSrc: Oral Oral Oral Oral  SpO2: 97% 98% 97% 100%  Weight:    80.4 kg  Height:    '5\' 8"'$  (1.727 m)    Wt Readings from Last 3 Encounters:  08/13/20 80.4 kg  08/03/20 81.6 kg  07/26/20 81.6 kg     Intake/Output Summary (Last 24 hours) at 08/13/2020 [id number] Last data filed at 08/13/2020 0600 Gross per 24 hour  Intake 2091.24 ml  Output 801 ml  Net 1290.24 ml     Physical Exam  Awake Alert, No new F.N deficits, Normal affect [place].AT,PERRAL Supple Neck,No JVD, No cervical lymphadenopathy appriciated.  Symmetrical Chest wall movement, Good air movement bilaterally, CTAB RRR,No Gallops,Rubs or new Murmurs, No Parasternal Heave +ve B.Sounds, Abd Soft, No tenderness, No organomegaly appriciated, No rebound - guarding or rigidity. No Cyanosis, Clubbing or edema, No new Rash or bruise        Data Review:    CBC Recent Labs  Lab 08/12/20 0727  08/12/20 1200 08/12/20 1747 08/12/20 2226 08/13/20 0038  WBC 15.3*  --  13.3*  --  12.4*  HGB 13.2 12.0* 9.8*  9.8* 9.1* 8.8*  HCT 40.5 36.3* 30.2*  30.7* 27.6* 26.0*  PLT 181  --  182  --  139*  MCV 89.8  --  91.6  --  86.4  MCH 29.3  --  29.3  --  29.2  MCHC 32.6  --  31.9  --  33.8  RDW 15.5  --  15.7*  --  16.1*    Recent Labs  Lab 08/12/20 0727 08/13/20 0038  NA 132* 134*  K 4.0 3.7  CL 101 102  CO2 20* 23  GLUCOSE 214* 214*  BUN 13 13  CREATININE 0.74 0.73  CALCIUM 9.0 8.2*  AST 22  --   ALT 27  --   ALKPHOS 70  --   BILITOT 1.0  --   ALBUMIN 4.1  --     ------------------------------------------------------------------------------------------------------------------ No results for input(s): CHOL, HDL, LDLCALC, TRIG, CHOLHDL, LDLDIRECT in the last 72 hours.  Lab Results  Component Value Date   HGBA1C 8.3 (H) 07/26/2020   ------------------------------------------------------------------------------------------------------------------ No results for input(s): TSH, T4TOTAL, T3FREE, THYROIDAB in the last 72 hours.  Invalid input(s): FREET3  Cardiac Enzymes No results for input(s): CKMB, TROPONINI, MYOGLOBIN in the last 168 hours.  Invalid input(s): CK ------------------------------------------------------------------------------------------------------------------ No results found for: BNP  Micro Results Recent Results (from the past 240 hour(s))  Resp Panel by RT-PCR (Flu A&B, Covid) Nasopharyngeal Swab     Status: None   Collection Time: 08/12/20  8:01 AM   Specimen: Nasopharyngeal Swab; Nasopharyngeal(NP) swabs in vial transport medium  Result Value Ref Range Status   SARS Coronavirus 2 by RT PCR NEGATIVE NEGATIVE Final    Comment: (NOTE) SARS-CoV-2 target nucleic acids are NOT DETECTED.  The SARS-CoV-2 RNA is generally detectable in upper respiratory specimens during the acute phase of infection. The lowest concentration of SARS-CoV-2 viral  copies this assay can detect is 138 copies/mL. A negative result does not preclude SARS-Cov-2 infection and should not be used as the sole basis for treatment or other patient management decisions. A negative result may occur with  improper specimen collection/handling, submission of specimen other than nasopharyngeal swab, presence of viral mutation(s) within the areas targeted by this assay, and inadequate number of viral copies(<138 copies/mL). A negative result must be combined with clinical observations, patient history, and epidemiological information. The expected result is Negative.  Fact Sheet for Patients:  EntrepreneurPulse.com.au  Fact Sheet for Healthcare Providers:  IncredibleEmployment.be  This test is no t yet approved or cleared  by the [place] and  has been authorized for detection and/or diagnosis of SARS-CoV-2 by FDA under an Emergency Use Authorization (EUA). This EUA will remain  in effect (meaning this test can be used) for the duration of the COVID-19 declaration under Section 564(b)(1) of the Act, 21 U.S.C.section 360bbb-3(b)(1), unless the authorization is terminated  or revoked sooner.       Influenza A by PCR NEGATIVE NEGATIVE Final   Influenza B by PCR NEGATIVE NEGATIVE Final    Comment: (NOTE) The Xpert Xpress SARS-CoV-2/FLU/RSV plus assay is intended as an aid in the diagnosis of influenza from Nasopharyngeal swab specimens and should not be used as a sole basis for treatment. Nasal washings and aspirates are unacceptable for Xpert Xpress SARS-CoV-2/FLU/RSV testing.  Fact Sheet for Patients: EntrepreneurPulse.com.au  Fact Sheet for Healthcare Providers: IncredibleEmployment.be  This test is not yet approved or cleared by the [place] FDA and has been authorized for detection and/or diagnosis of SARS-CoV-2 by FDA under an Emergency Use Authorization (EUA). This  EUA will remain in effect (meaning this test can be used) for the duration of the COVID-19 declaration under Section 564(b)(1) of the Act, 21 U.S.C. section 360bbb-3(b)(1), unless the authorization is terminated or revoked.  Performed at [facility], [place] [address] [place], [place] [address]     Radiology Reports [place] COMPLETE JOINT SPACE STRUCTURE UP LEFT  Result Date: 08/04/2020 Formatting of this result is different from the original. [place] left shoulder: -Biceps tendon: Well visualized within the bicipital groove and without any abnormalities -Pectoralis: Insertion visualized and without abnormalities. -Subscapularis: Well visualized to insertion point on humerus.  No abnormalities.  Dynamic testing over the coracoid did not show signs of impingement. -AC joint: No osteophytes, no significant separation, negative geyser sign. -Supraspinatus: Well-visualized and without any abnormalities.  Dynamic testing did not reveal signs of impingement. -Subacromial bursa: No obvious enlargement or swelling. -Infraspinatus/teres minor: Insertion point on posterior humerus visualized and without abnormalities.   Impression: -Normal ultrasound examination of the left shoulder.  DG Chest Port 1 View  Result Date: 08/12/2020 CLINICAL DATA:  GI bleed. EXAM: PORTABLE CHEST 1 VIEW COMPARISON:  03/28/2020 FINDINGS: The cardiac silhouette, mediastinal and hilar contours is stable. The lungs are clear. No pleural effusions. No pulmonary lesions. The bony thorax is intact. IMPRESSION: No acute cardiopulmonary findings. Electronically Signed   By: Marijo Sanes M.D.   On: 08/12/2020 08:58   DG Shoulder Left  Result Date: 07/28/2020 CLINICAL DATA:  Chronic left shoulder pain EXAM: LEFT SHOULDER - 2+ VIEW COMPARISON:  None. FINDINGS: There is no evidence of fracture or dislocation. Mild osteoarthritis of the glenohumeral and acromioclavicular joints. No soft tissue swelling. Median sternotomy wires are noted.  IMPRESSION: Mild osteoarthritis of the left shoulder.  No acute findings. Electronically Signed   By: Davina Poke D.O.   On: 07/28/2020 10:37   CT Angio Abd/Pel w/ and/or w/o  Result Date: 08/12/2020 CLINICAL DATA:  60 year old with melena. Evaluate for lower GI bleed. EXAM: CTA ABDOMEN AND PELVIS WITHOUT AND WITH CONTRAST TECHNIQUE: Multidetector CT imaging of the abdomen and pelvis was performed using the standard protocol during bolus administration of intravenous contrast. Multiplanar reconstructed images and MIPs were obtained and reviewed to evaluate the vascular anatomy. CONTRAST:  134m OMNIPAQUE IOHEXOL 350 MG/ML SOLN COMPARISON:  02/22/2020 FINDINGS: VASCULAR Aorta: Mild atherosclerotic disease in the in the abdominal aorta without aneurysm, dissection or stenosis. Celiac: Patent without evidence of aneurysm, dissection, vasculitis or significant stenosis.  SMA: Patent without evidence of aneurysm, dissection, vasculitis or significant stenosis. Renals: Both renal arteries are patent without evidence of aneurysm, dissection, vasculitis, fibromuscular dysplasia or significant stenosis. IMA: Patent without evidence of aneurysm, dissection, vasculitis or significant stenosis. Inflow: Patent without evidence of aneurysm, dissection, vasculitis or significant stenosis. Proximal Outflow: Proximal femoral arteries are patent bilaterally. Veins: Portal venous system is patent. No gross abnormality to the IVC or renal veins. No gross abnormality to the iliac veins. Review of the MIP images confirms the above findings. NON-VASCULAR Lower chest: Prior median sternotomy.  Lung bases are clear. Hepatobiliary: Diffuse low-attenuation of the liver is suggestive for steatosis. No focal liver lesion. Multiple gallstones without gallbladder inflammation or distension. No significant biliary dilatation. Pancreas: Unremarkable. No pancreatic ductal dilatation or surrounding inflammatory changes. Spleen: Normal in size  without focal abnormality. Adrenals/Urinary Tract: Adrenal glands are unremarkable. Kidneys are normal, without renal calculi, focal lesion, or hydronephrosis. Bladder is unremarkable. Stomach/Bowel: Normal appearance of the stomach. Normal appearance of small bowel with focal bowel inflammation or obstruction. Normal appendix. Scattered colonic diverticula without acute inflammation. Again noted is large left inguinal hernia containing portion of the sigmoid colon. There is no evidence for wall thickening or inflammation associated with this large inguinal hernia. No evidence for active GI bleeding. Lymphatic: No lymphadenopathy in the abdomen or pelvis. Reproductive: Prostate is unremarkable. Other: Negative for ascites. Large left inguinal hernia containing sigmoid colon but no inflammatory changes. A tiny umbilical hernia containing fat. Fat containing right inguinal hernia. Musculoskeletal: No acute bone abnormality. Degenerative facet disease in the lower lumbar spine. IMPRESSION: VASCULAR 1. No evidence for active GI bleeding. 2. Mild atherosclerotic disease in the abdominal aorta. Aortic Atherosclerosis (ICD10-I70.0). 3. Main visceral arteries are patent. NON-VASCULAR 1. No acute abnormalities in the abdomen or pelvis. 2. Again noted is a large left inguinal hernia containing a large portion of the sigmoid colon. No acute inflammatory changes associated with this hernia and no evidence for bowel obstruction. 3. Colonic diverticulosis. 4. Cholelithiasis. 5. Hepatic steatosis. Electronically Signed   By: Markus Daft M.D.   On: 08/12/2020 14:12

## 2020-08-13 NOTE — Progress Notes (Signed)
[facility] night shift  The staff reported the patient was hypotensive with a BP of 91/58 mmHg.  He received furosemide 20 mg IVP earlier and has had around 600 mL of urine output.  A 1000 mL LR bolus over 4 hours was ordered.  Tennis Must, MD.

## 2020-08-13 NOTE — Interval H&P Note (Signed)
History and Physical Interval Note:  08/13/2020 8:09 AM  Colton Guzman.  has presented today for surgery, with the diagnosis of GI bleed.  The various methods of treatment have been discussed with the patient and family. After consideration of risks, benefits and other options for treatment, the patient has consented to  Procedure(s): COLONOSCOPY WITH PROPOFOL (N/A) as a surgical intervention.  The patient's history has been reviewed, patient examined, no change in status, stable for surgery.  I have reviewed the patient's chart and labs.  Questions were answered to the patient's satisfaction.     [name]

## 2020-08-13 NOTE — Anesthesia Procedure Notes (Signed)
Procedure Name: MAC Date/Time: 08/13/2020 7:55 AM Performed by: Eligha Bridegroom, CRNA Pre-anesthesia Checklist: Patient identified, Emergency Drugs available, Suction available and Patient being monitored Patient Re-evaluated:Patient Re-evaluated prior to induction Oxygen Delivery Method: Nasal cannula Induction Type: IV induction

## 2020-08-13 NOTE — Transfer of Care (Signed)
Immediate Anesthesia Transfer of Care Note  Patient: Colton Guzman.  Procedure(s) Performed: COLONOSCOPY WITH PROPOFOL  Patient Location: PACU  Anesthesia Type:MAC  Level of Consciousness: awake, alert  and oriented  Airway & Oxygen Therapy: Patient Spontanous Breathing  Post-op Assessment: Report given to RN and Post -op Vital signs reviewed and stable  Post vital signs: Reviewed and stable  Last Vitals:  Vitals Value Taken Time  BP 118/79 08/13/20 0902  Temp 37.1 C 08/13/20 0902  Pulse 86 08/13/20 0907  Resp 25 08/13/20 0907  SpO2 99 % 08/13/20 0907  Vitals shown include unvalidated device data.  Last Pain:  Vitals:   08/13/20 0902  TempSrc:   PainSc: 0-No pain         Complications: No notable events documented.

## 2020-08-13 NOTE — Op Note (Addendum)
[facility] Patient Name: Colton Guzman Procedure Date : 08/13/2020 MRN: [id number] Attending MD: Gatha Mayer , MD Date of Birth: 1960-07-30 CSN: [id number] Age: 60 Admit Type: Inpatient Procedure:                Colonoscopy Indications:              Hematochezia Providers:                Gatha Mayer, MD, Glori Bickers, RN, William Dalton, Technician Referring MD:              Medicines:                Propofol per Anesthesia, Monitored Anesthesia Care Complications:            No immediate complications. Estimated Blood Loss:     Estimated blood loss: none. Procedure:                Pre-Anesthesia Assessment:                           - Prior to the procedure, a History and Physical                            was performed, and patient medications and                            allergies were reviewed. The patient's tolerance of                            previous anesthesia was also reviewed. The risks                            and benefits of the procedure and the sedation                            options and risks were discussed with the patient.                            All questions were answered, and informed consent                            was obtained. Prior Anticoagulants: The patient                            last took Eliquis (apixaban) 1 day prior to the                            procedure. ASA Grade Assessment: III - A patient                            with severe systemic disease. After reviewing the  risks and benefits, the patient was deemed in                            satisfactory condition to undergo the procedure.                           After obtaining informed consent, the colonoscope                            was passed under direct vision. Throughout the                            procedure, the patient's blood pressure, pulse, and                            oxygen  saturations were monitored continuously. The                            [id number] Olympus colonoscope was                            introduced through the anus and advanced to the the                            cecum, identified by appendiceal orifice and                            ileocecal valve. The colonoscopy was somewhat                            difficult due to significant looping. Successful                            completion of the procedure was aided by applying                            abdominal pressure. The patient tolerated the                            procedure well. The quality of the bowel                            preparation was adequate. The ileocecal valve,                            appendiceal orifice, and rectum were photographed. Scope In: 8:25:05 AM Scope Out: 8:51:57 AM Scope Withdrawal Time: 0 hours 15 minutes 14 seconds  Total Procedure Duration: 0 hours 26 minutes 52 seconds  Findings:      The perianal and digital rectal examinations were normal. Pertinent       negatives include normal prostate (size, shape, and consistency).      Multiple small and large-mouthed diverticula were found in the sigmoid       colon, descending colon, transverse colon and ascending colon.  The exam was otherwise without abnormality on direct and retroflexion       views. Impression:               - Severe diverticulosis in the sigmoid colon, in                            the descending colon, in the transverse colon and                            in the ascending colon. There was residual old                            blood - small amount in left colon - not beyond                            splenic flexure and en face exam IC valve (could                            not enter TI) was negative for blood                           So I think left colon diverticular bleed currently                            stopped - I carefully flushed and inspected but                             could not find any source site                           - The examination was otherwise normal on direct                            and retroflexion views.                           - No specimens collected.                           Looping from Left Inguinal hernia Recommendation:           - Return patient to hospital ward for ongoing care.                           - Clear liquid diet.                           - If he bleeds more I will consider unprepped                            repeat colonoscopy to see if I can find a source                            and treat  So leave on clears until we see him again Procedure Code(s):        --- Professional ---                           [phone], Colonoscopy, flexible; diagnostic, including                            collection of specimen(s) by brushing or washing,                            when performed (separate procedure) Diagnosis Code(s):        --- Professional ---                           K92.1, Melena (includes Hematochezia)                           K57.30, Diverticulosis of large intestine without                            perforation or abscess without bleeding CPT copyright 2019 [employer]. All rights reserved. The codes documented in this report are preliminary and upon coder review may  be revised to meet current compliance requirements. Gatha Mayer, MD 08/13/2020 9:19:30 AM This report has been signed electronically. Number of Addenda: 0

## 2020-08-13 NOTE — Anesthesia Postprocedure Evaluation (Signed)
Anesthesia Post Note  Patient: Colton Guzman.  Procedure(s) Performed: COLONOSCOPY WITH PROPOFOL     Patient location during evaluation: Endoscopy Anesthesia Type: MAC Level of consciousness: awake and alert, patient cooperative and oriented Pain management: pain level controlled Vital Signs Assessment: post-procedure vital signs reviewed and stable Respiratory status: spontaneous breathing, nonlabored ventilation, respiratory function stable and patient connected to nasal cannula oxygen Cardiovascular status: stable and blood pressure returned to baseline Postop Assessment: no apparent nausea or vomiting Anesthetic complications: no   No notable events documented.  Last Vitals:  Vitals:   08/13/20 0930 08/13/20 0951  BP: 113/70 (!) 117/55  Pulse: 87 91  Resp: (!) 24 (!) 21  Temp: 37.1 C 36.8 C  SpO2: 97% 97%    Last Pain:  Vitals:   08/13/20 0951  TempSrc: Oral  PainSc:                  Maribel Luis,E. Steffani Dionisio

## 2020-08-14 ENCOUNTER — Inpatient Hospital Stay (HOSPITAL_COMMUNITY): Payer: Managed Care, Other (non HMO)

## 2020-08-14 LAB — BASIC METABOLIC PANEL
Anion gap: 9 (ref 5–15)
BUN: 6 mg/dL (ref 6–20)
CO2: 25 mmol/L (ref 22–32)
Calcium: 8.2 mg/dL — ABNORMAL LOW (ref 8.9–10.3)
Chloride: 102 mmol/L (ref 98–111)
Creatinine, Ser: 0.78 mg/dL (ref 0.61–1.24)
GFR, Estimated: >60 mL/min (ref >60)
Glucose, Bld: 317 mg/dL — ABNORMAL HIGH (ref 70–99)
Potassium: 3.3 mmol/L — ABNORMAL LOW (ref 3.5–5.1)
Sodium: 136 mmol/L (ref 135–145)

## 2020-08-14 LAB — CBC
HCT: 21.3 % — ABNORMAL LOW (ref 39.0–52.0)
HCT: 27.2 % — ABNORMAL LOW (ref 39.0–52.0)
HCT: 32.8 % — ABNORMAL LOW (ref 39.0–52.0)
Hemoglobin: 7.3 g/dL — ABNORMAL LOW (ref 13.0–17.0)
Hemoglobin: 9.4 g/dL — ABNORMAL LOW (ref 13.0–17.0)
Hemoglobin: 11.4 g/dL — ABNORMAL LOW (ref 13.0–17.0)
MCH: 29.8 pg (ref 26.0–34.0)
MCH: 29.6 pg (ref 26.0–34.0)
MCH: 29.3 pg (ref 26.0–34.0)
MCHC: 34.3 g/dL (ref 30.0–36.0)
MCHC: 34.6 g/dL (ref 30.0–36.0)
MCHC: 34.8 g/dL (ref 30.0–36.0)
MCV: 86.9 fL (ref 80.0–100.0)
MCV: 85.5 fL (ref 80.0–100.0)
MCV: 84.3 fL (ref 80.0–100.0)
Platelets: 104 10*3/uL — ABNORMAL LOW (ref 150–400)
Platelets: 105 10*3/uL — ABNORMAL LOW (ref 150–400)
Platelets: 120 10*3/uL — ABNORMAL LOW (ref 150–400)
RBC: 2.45 MIL/uL — ABNORMAL LOW (ref 4.22–5.81)
RBC: 3.18 MIL/uL — ABNORMAL LOW (ref 4.22–5.81)
RBC: 3.89 MIL/uL — ABNORMAL LOW (ref 4.22–5.81)
RDW: 15.2 % (ref 11.5–15.5)
RDW: 14.9 % (ref 11.5–15.5)
RDW: 15 % (ref 11.5–15.5)
WBC: 8 10*3/uL (ref 4.0–10.5)
WBC: 7.7 10*3/uL (ref 4.0–10.5)
WBC: 9.6 10*3/uL (ref 4.0–10.5)
nRBC: 0 % (ref 0.0–0.2)
nRBC: 0 % (ref 0.0–0.2)
nRBC: 0 % (ref 0.0–0.2)

## 2020-08-14 LAB — MAGNESIUM: Magnesium: 1.8 mg/dL (ref 1.7–2.4)

## 2020-08-14 LAB — PREPARE RBC (CROSSMATCH)

## 2020-08-14 LAB — GLUCOSE, CAPILLARY
Glucose-Capillary: 208 mg/dL — ABNORMAL HIGH (ref 70–99)
Glucose-Capillary: 225 mg/dL — ABNORMAL HIGH (ref 70–99)
Glucose-Capillary: 228 mg/dL — ABNORMAL HIGH (ref 70–99)
Glucose-Capillary: 146 mg/dL — ABNORMAL HIGH (ref 70–99)

## 2020-08-14 LAB — HEMOGLOBIN AND HEMATOCRIT, BLOOD
HCT: 22.5 % — ABNORMAL LOW (ref 39.0–52.0)
Hemoglobin: 7.7 g/dL — ABNORMAL LOW (ref 13.0–17.0)

## 2020-08-14 LAB — BRAIN NATRIURETIC PEPTIDE: B Natriuretic Peptide: 33.9 pg/mL (ref 0.0–100.0)

## 2020-08-14 MED ORDER — TECHNETIUM TC 99M PYROPHOSPHATE
21.6000 | Freq: Once | INTRAVENOUS | Status: AC | PRN
  Administered 2020-08-14: 21.6 via INTRAVENOUS
  Filled 2020-08-14: qty 22

## 2020-08-14 MED ORDER — SODIUM CHLORIDE 0.9% IV SOLUTION: Freq: Once | INTRAVENOUS | Status: AC

## 2020-08-14 MED ORDER — POTASSIUM CHLORIDE 20 MEQ PO PACK
40.0000 meq | PACK | Freq: Once | ORAL | Status: DC
  Filled 2020-08-14: qty 2

## 2020-08-14 MED ORDER — FUROSEMIDE 10 MG/ML IJ SOLN
20.0000 mg | Freq: Once | INTRAMUSCULAR | Status: AC
  Administered 2020-08-14: 20 mg via INTRAVENOUS
  Filled 2020-08-14: qty 2

## 2020-08-14 MED ORDER — SODIUM CHLORIDE 0.9% IV SOLUTION: Freq: Once | INTRAVENOUS | Status: DC

## 2020-08-14 MED ORDER — FUROSEMIDE 10 MG/ML IJ SOLN: 40.0000 mg | Freq: Once | INTRAMUSCULAR | Status: DC

## 2020-08-14 NOTE — Progress Notes (Signed)
Just came back to unit from procedure.

## 2020-08-14 NOTE — Progress Notes (Addendum)
Patient ID: [name]., male   DOB: 07-Dec-1960, 60 y.o.   MRN: [id number]    Progress Note   Subjective   Day # 3  CC; GI bleed  Last hemoglobin 7.3 early this a.m. down from 8.4 yesterday-status post 4 units since admission, 2 of which were given this morning-follow-up hemoglobin pending  No further bleeding since last evening which was a smaller amount. Patient has no complaints of abdominal discomfort, he is hungry and is asking about going home.     Objective   Vital signs in last 24 hours: Temp:  [97.7 F (36.5 C)-98.9 F (37.2 C)] 98.1 F (36.7 C) (06/26 1156) Pulse Rate:  [71-91] 79 (06/26 1156) Resp:  [16-25] 18 (06/26 1156) BP: (91-123)/(58-82) 108/64 (06/26 1156) SpO2:  [92 %-100 %] 96 % (06/26 1156) Last BM Date: 08/13/20 General:   Older male in NAD, pleasant, wife at bedside Heart:  Regular rate and rhythm; no murmurs Lungs: Respirations even and unlabored, lungs CTA bilaterally Abdomen:  Soft, nontender and nondistended. Normal bowel sounds. Extremities:  Without edema. Neurologic:  Alert and oriented,  grossly normal neurologically. Psych:  Cooperative. Normal mood and affect.  Intake/Output from previous day: 06/25 0701 - 06/26 0700 In: 2935.8 [P.O.:240; I.V.:210; Blood:2485.8] Out: 2600 [Urine:2600] Intake/Output this shift: Total I/O In: 650 [Blood:650] Out: -   Lab Results: Recent Labs    08/13/20 0038 08/13/20 1040 08/13/20 2116 08/13/20 2359 08/14/20 0313  WBC 12.4*  --  9.8  --  8.0  HGB 8.8*   < > 8.4* 7.7* 7.3*  HCT 26.0*   < > 24.2* 22.5* 21.3*  PLT 139*  --  115*  --  104*   < > = values in this interval not displayed.   BMET Recent Labs    08/12/20 0727 08/13/20 0038  NA 132* 134*  K 4.0 3.7  CL 101 102  CO2 20* 23  GLUCOSE 214* 214*  BUN 13 13  CREATININE 0.74 0.73  CALCIUM 9.0 8.2*   LFT Recent Labs    08/12/20 0727  PROT 7.9  ALBUMIN 4.1  AST 22  ALT 27  ALKPHOS 70  BILITOT 1.0     Assessment  / Plan:     #48 60 year old male with recurrent acute lower GI bleed felt diverticular in etiology, occurred in setting of Eliquis. Patient doing well today, bleed seems to be resolving, last bloody stool last p.m.  Plan discussed, for tagged scan if recurrent bleeding.  Colonoscopy yesterday with multiple small and large mouth diverticuli in the sigmoid, descending transverse and ascending, there was some old residual blood in the left colon not beyond the no active bleeding. CT angio 08/12/2020 negative for any active bleeding, large left inguinal hernia containing a portion of the sigmoid colon  Patient has required 4 units of packed RBCs this admission.  He had a massive bleed in January complicated by MI requiring CABG  Decision has been made by cardiology not to resume Eliquis for A. fib, continue aspirin and will consider watchman device.  #2 atrial fibrillation history-as above #3 coronary artery disease that is post MI January 2022, CABG x5 #4 hypertension  Plan; advance to full liquid diet, if no further bleeding soft diet in a.m. Continue to trend hemoglobin If patient remained stable and hemoglobin stable tomorrow without any further evidence of bleeding he may be able to be discharged.    LOS: 1 day   Amy Esterwood PA-C 08/14/2020, 1:05 PM  [facility] Attending   I have taken an interval history, reviewed the chart and examined the patient. I agree with the Advanced Practitioner's note, impression and recommendations.   Subsequently he has been bleeding significantly again so we will get interventional radiology to coordinate a nuclear medicine tagged scan to see if we can localize the bleeding.  Based upon my colonoscopy I think it is in the left colon.  I had previously contemplated sigmoidoscopy in case he bled again but that is not possible for me to do today.  The yield on that is likely pretty low anyhow.  We will follow-up tomorrow  Gatha Mayer,  MD, [facility] 08/14/2020 6:14 PM

## 2020-08-14 NOTE — Progress Notes (Signed)
Wife requested to delay blood transfusion per family's recommendation. Health teachings rendered.

## 2020-08-14 NOTE — Plan of Care (Signed)
  Problem: Education: Goal: Knowledge of General Education information will improve Description: Including pain rating scale, medication(s)/side effects and non-pharmacologic comfort measures Outcome: Progressing   Problem: Health Behavior/Discharge Planning: Goal: Ability to manage health-related needs will improve Outcome: Progressing   Problem: Clinical Measurements: Goal: Ability to maintain clinical measurements within normal limits will improve Outcome: Progressing Goal: Will remain free from infection Outcome: Progressing Goal: Diagnostic test results will improve Outcome: Progressing Goal: Respiratory complications will improve Outcome: Progressing Goal: Cardiovascular complication will be avoided Outcome: Progressing   Problem: Activity: Goal: Risk for activity intolerance will decrease Outcome: Progressing   Problem: Nutrition: Goal: Adequate nutrition will be maintained Outcome: Progressing   Problem: Coping: Goal: Level of anxiety will decrease Outcome: Progressing   Problem: Elimination: Goal: Will not experience complications related to bowel motility Outcome: Progressing Goal: Will not experience complications related to urinary retention Outcome: Progressing   Problem: Safety: Goal: Ability to remain free from injury will improve Outcome: Progressing   Problem: Pain Managment: Goal: General experience of comfort will improve Outcome: Progressing   Problem: Skin Integrity: Goal: Risk for impaired skin integrity will decrease Outcome: Progressing   Problem: Education: Goal: Ability to identify signs and symptoms of gastrointestinal bleeding will improve Outcome: Progressing   Problem: Bowel/Gastric: Goal: Will show no signs and symptoms of gastrointestinal bleeding Outcome: Progressing   Problem: Fluid Volume: Goal: Will show no signs and symptoms of excessive bleeding Outcome: Progressing   Problem: Clinical Measurements: Goal:  Complications related to the disease process, condition or treatment will be avoided or minimized Outcome: Progressing

## 2020-08-14 NOTE — Progress Notes (Signed)
Medium sized bloody stool with blood clots x2 this shift. S/p 3 units of blood.

## 2020-08-14 NOTE — Evaluation (Signed)
Physical Therapy Evaluation and Discharge Patient Details Name: Colton Guzman [facility]. MRN: [id number] DOB: March 17, 1960 Today's Date: 08/14/2020   History of Present Illness  60 y.o. male presented 08/12/20 with complaints of blood in his stool. 2 syncopal episodes while in hospital; 6/25 colonoscopy with severe diverticulosis;   PMH significant of hypertension, dyslipidemia, VT arrest, PAF on Eliquis, CAD s/p 5 vessel CABG 02/2020, and GI bleed  Clinical Impression   Patient evaluated by Physical Therapy with no further acute PT needs identified. Patient ambulated 300 ft independently with all VSS. PT is signing off. Thank you for this referral.     Follow Up Recommendations No PT follow up    Equipment Recommendations  None recommended by PT    Recommendations for Other Services       Precautions / Restrictions Precautions Precautions: Other (comment) Precaution Comments: syncope      Mobility  Bed Mobility Overal bed mobility: Independent                  Transfers Overall transfer level: Independent               General transfer comment: no dizziness  Ambulation/Gait Ambulation/Gait assistance: Independent Gait Distance (Feet): 300 Feet Assistive device: None Gait Pattern/deviations: WFL(Within Functional Limits)   Gait velocity interpretation: >2.62 ft/sec, indicative of community ambulatory General Gait Details: denied dizziness; HR 40s, sats 99% on RA, BP 120/83  Stairs            Wheelchair Mobility    Modified Rankin (Stroke Patients Only)       Balance Overall balance assessment: Independent                                           Pertinent Vitals/Pain Pain Assessment: No/denies pain    Home Living Family/patient expects to be discharged to:: Private residence Living Arrangements: Spouse/significant other Available Help at Discharge: Family Type of Home: House Home Access: Level entry     Home  Layout: Two level;Able to live on main level with bedroom/bathroom;1/2 bath on main level Home Equipment: Walker - 2 wheels      Prior Function Level of Independence: Independent               Hand Dominance   Dominant Hand: Right    Extremity/Trunk Assessment   Upper Extremity Assessment Upper Extremity Assessment: Overall WFL for tasks assessed    Lower Extremity Assessment Lower Extremity Assessment: Overall WFL for tasks assessed    Cervical / Trunk Assessment Cervical / Trunk Assessment: Normal  Communication   Communication: No difficulties  Cognition Arousal/Alertness: Awake/alert Behavior During Therapy: WFL for tasks assessed/performed Overall Cognitive Status: Within Functional Limits for tasks assessed                                        General Comments General comments (skin integrity, edema, etc.): wife present throughout    Exercises     Assessment/Plan    PT Assessment Patent does not need any further PT services  PT Problem List         PT Treatment Interventions      PT Goals (Current goals can be found in the Care Plan section)  Acute Rehab PT Goals PT Goal Formulation: All assessment and  education complete, DC therapy    Frequency     Barriers to discharge        Co-evaluation               AM-PAC PT "6 Clicks" Mobility  Outcome Measure Help needed turning from your back to your side while in a flat bed without using bedrails?: None Help needed moving from lying on your back to sitting on the side of a flat bed without using bedrails?: None Help needed moving to and from a bed to a chair (including a wheelchair)?: None Help needed standing up from a chair using your arms (e.g., wheelchair or bedside chair)?: None Help needed to walk in hospital room?: None Help needed climbing 3-5 steps with a railing? : None 6 Click Score: 24    End of Session Equipment Utilized During Treatment: Gait  belt Activity Tolerance: Patient tolerated treatment well Patient left: in chair;with family/visitor present (at sink to do bath with wife's assistance) Nurse Communication: Mobility status PT Visit Diagnosis: Difficulty in walking, not elsewhere classified (R26.2)    Time: [id number] PT Time Calculation (min) (ACUTE ONLY): 16 min   Charges:   PT Evaluation $PT Eval Low Complexity: 1 Low           Arby Barrette, PT Pager [phone]   Rexanne Mano 08/14/2020, 12:32 PM

## 2020-08-14 NOTE — Progress Notes (Addendum)
PROGRESS NOTE                                                                                                                                                                                                             Patient Demographics:    Colton Guzman, is a 60 y.o. male, DOB - 1960/11/02, [id number]  Outpatient Primary MD for the patient is [name], MD    LOS - 1  Admit date - 08/12/2020    CC - Blood in stool     Brief Narrative (HPI from H&P)  - Colton Neptune Slone Fazel. is a 60 y.o. 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   Subjective:   Patient in bed, appears comfortable, denies any headache, no fever, no chest pain or pressure, no shortness of breath , no abdominal pain. No new focal weakness.    Assessment  & Plan :     Acute L.GIB POA likely diverticular - he has received total of 4 units including 2 units going and on 08/14/2020, will try to keep his hemoglobin around 8 due to significant underlying cardiac history, continue to monitor CBC, he was on anticoagulation for A. fib which has been reversed, currently bowel rest, PPI, GI on board, underwent colonoscopy which did not identify any active bleeding on 08/13/2020, will continue to monitor -discussed with GI if rebleeds will try tagged RBC scan.  Addendum  - 2pm more blood PR, repeat PRBC transfusion stay ahead, keep hB> 8, DW GI and IR, tagged PRBC scan now + IR consult, Vitals stable.   2. Acute lower GI bleed related symptomatic anemia.  Transfuse and as in #1 above.  3.  Syncope from #1 above.  Stable.  4.   CAD s/p CABG and V. tach arrest.  Currently on statin for secondary prevention, aspirin and Eliquis held for GI bleed.  Once blood pressure improves low-dose beta-blocker.  Cardiology on board appreciate help, once bleeding has subsided we will try him on 81 mg  of aspirin.  No anticoagulation for now post discharge follow-up with primary cardiologist for long-term monitoring.  He has had issues with recurrent bleeding.  5.  Paroxysmal A. fib [place] vas 2 score of greater than 2.  Was on Eliquis which is held and reversed with Kcentra this admission due to GI bleed.  Resume once he is stable and cleared by GI.  6.  Dyslipidemia.  On statin.  7.  Essential hypertension.  Low-dose beta-blocker once blood pressure stabilizes.  8.  DM type II.  Sliding scale for now.  Recent A1c was 8.3.  Outpatient follow-up with PCP for glycemic control post discharge.  Lab Results  Component Value Date   HGBA1C 8.3 (H) 07/26/2020   CBG (last 3)  Recent Labs    08/13/20 1654 08/13/20 2005 08/14/20 0803  GLUCAP 315* 280* 208*       Obesity: follow with PCP Estimated body mass index is 26.95 kg/m as calculated from the following:   Height as of this encounter: '5\' 8"'$  (1.727 m).   Weight as of this encounter: 80.4 kg.          Condition - Fair  Family Communication  :  wife bedside  Code Status :  Full  Consults  :  GI, Cards  PUD Prophylaxis : PPI   Procedures  :     CT - VASCULAR 1. No evidence for active GI bleeding. 2. Mild atherosclerotic disease in the abdominal aorta. Aortic Atherosclerosis (ICD10-I70.0). 3. Main visceral arteries are patent. NON-VASCULAR 1. No acute abnormalities in the abdomen or pelvis. 2. Again noted is a large left inguinal hernia containing a large portion of the sigmoid colon. No acute inflammatory changes associated with this hernia and no evidence for bowel obstruction. 3. Colonic diverticulosis. 4. Cholelithiasis. 5. Hepatic steatosis.      Disposition Plan   :    Status is: Inpatient  Remains inpatient appropriate because:IV treatments appropriate due to intensity of illness or inability to take PO  Dispo: The patient is from: Home              Anticipated d/c is to: Home              Patient currently is not medically stable to d/c.   Difficult to place patient No  DVT Prophylaxis  :    SCDs Start: 08/12/20 1012  Lab Results  Component Value Date   PLT 104 (L) 08/14/2020    Diet :  Diet Order             Diet clear liquid Room service appropriate? Yes; Fluid consistency: Thin  Diet effective now                    Inpatient Medications  Scheduled Meds:  atorvastatin  80 mg Oral Daily   furosemide  20 mg Intravenous Once   insulin aspart  0-15 Units Subcutaneous TID WC   insulin aspart  0-5 Units Subcutaneous QHS   isosorbide mononitrate  15 mg Oral Daily   magnesium oxide  200 mg Oral Daily   metoprolol succinate  25 mg Oral Daily   pantoprazole (PROTONIX) IV  40 mg Intravenous BID   sodium chloride flush  3 mL Intravenous Q12H   Continuous Infusions:   PRN Meds:.acetaminophen **OR** acetaminophen, albuterol, nitroGLYCERIN, ondansetron **OR** ondansetron (ZOFRAN) IV, traMADol  Antibiotics  :    Anti-infectives (From admission, onward)    None  Time Spent in minutes  30   Lala Lund M.D on 08/14/2020 at 9:12 AM  To page go to [email or web address]   [facility] -  Office  [phone]    See all Orders from today for further details    Objective:   Vitals:   08/14/20 0523 08/14/20 0806 08/14/20 0817 08/14/20 0902  BP: 104/68 113/68 123/71 110/65  Pulse: 78 78  80  Resp: '17 18 20 17  '$ Temp: 97.8 F (36.6 C) 97.7 F (36.5 C) 98 F (36.7 C) 98 F (36.7 C)  TempSrc: Oral Oral Oral Oral  SpO2: 96% 98%  96%  Weight:      Height:        Wt Readings from Last 3 Encounters:  08/13/20 80.4 kg  08/03/20 81.6 kg  07/26/20 81.6 kg     Intake/Output Summary (Last 24 hours) at  08/14/2020 0912 Last data filed at 08/13/2020 2115 Gross per 24 hour  Intake 2735.83 ml  Output 2600 ml  Net 135.83 ml     Physical Exam  Awake Alert, No new F.N deficits, Normal affect [place].AT,PERRAL Supple Neck,No JVD, No cervical lymphadenopathy appriciated.  Symmetrical Chest wall movement, Good air movement bilaterally, CTAB RRR,No Gallops, Rubs or new Murmurs, No Parasternal Heave +ve B.Sounds, Abd Soft, No tenderness, No organomegaly appriciated, No rebound - guarding or rigidity. No Cyanosis, Clubbing or edema, No new Rash or bruise      Data Review:    CBC Recent Labs  Lab 08/12/20 0727 08/12/20 1200 08/12/20 1747 08/12/20 2226 08/13/20 0038 08/13/20 1040 08/13/20 2116 08/13/20 2359 08/14/20 0313  WBC 15.3*  --  13.3*  --  12.4*  --  9.8  --  8.0  HGB 13.2   < > 9.8*  9.8*   < > 8.8* 7.8* 8.4* 7.7* 7.3*  HCT 40.5   < > 30.2*  30.7*   < > 26.0* 24.0* 24.2* 22.5* 21.3*  PLT 181  --  182  --  139*  --  115*  --  104*  MCV 89.8  --  91.6  --  86.4  --  85.8  --  86.9  MCH 29.3  --  29.3  --  29.2  --  29.8  --  29.8  MCHC 32.6  --  31.9  --  33.8  --  34.7  --  34.3  RDW 15.5  --  15.7*  --  16.1*  --  15.3  --  15.2   < > = values in this interval not displayed.    Recent Labs  Lab 08/12/20 0727 08/13/20 0038 08/14/20 0313  NA 132* 134*  --   K 4.0 3.7  --   CL 101 102  --   CO2 20* 23  --   GLUCOSE 214* 214*  --   BUN 13 13  --   CREATININE 0.74 0.73  --   CALCIUM 9.0 8.2*  --   AST 22  --   --   ALT 27  --   --   ALKPHOS 70  --   --   BILITOT 1.0  --   --   ALBUMIN 4.1  --   --   MG  --   --  1.8  BNP  --   --  33.9    ------------------------------------------------------------------------------------------------------------------ No results for input(s): CHOL, HDL, LDLCALC, TRIG, CHOLHDL, LDLDIRECT in the last 72 hours.  Lab Results  Component Value Date   HGBA1C 8.3 (  H) 07/26/2020    ------------------------------------------------------------------------------------------------------------------ No results for input(s): TSH, T4TOTAL, T3FREE, THYROIDAB in the last 72 hours.  Invalid input(s): FREET3  Cardiac Enzymes No results for input(s): CKMB, TROPONINI, MYOGLOBIN in the last 168 hours.  Invalid input(s): CK ------------------------------------------------------------------------------------------------------------------    Component Value Date/Time   BNP 33.9 08/14/2020 0313    Micro Results Recent Results (from the past 240 hour(s))  Resp Panel by RT-PCR (Flu A&B, Covid) Nasopharyngeal Swab     Status: None   Collection Time: 08/12/20  8:01 AM   Specimen: Nasopharyngeal Swab; Nasopharyngeal(NP) swabs in vial transport medium  Result Value Ref Range Status   SARS Coronavirus 2 by RT PCR NEGATIVE NEGATIVE Final    Comment: (NOTE) SARS-CoV-2 target nucleic acids are NOT DETECTED.  The SARS-CoV-2 RNA is generally detectable in upper respiratory specimens during the acute phase of infection. The lowest concentration of SARS-CoV-2 viral copies this assay can detect is 138 copies/mL. A negative result does not preclude SARS-Cov-2 infection and should not be used as the sole basis for treatment or other patient management decisions. A negative result may occur with  improper specimen collection/handling, submission of specimen other than nasopharyngeal swab, presence of viral mutation(s) within the areas targeted by this assay, and inadequate number of viral copies(<138 copies/mL). A negative result must be combined with clinical observations, patient history, and epidemiological information. The expected result is Negative.  Fact Sheet for Patients:  EntrepreneurPulse.com.au  Fact Sheet for Healthcare Providers:  IncredibleEmployment.be  This test is no t yet approved or cleared by the [place] FDA and  has  been authorized for detection and/or diagnosis of SARS-CoV-2 by FDA under an Emergency Use Authorization (EUA). This EUA will remain  in effect (meaning this test can be used) for the duration of the COVID-19 declaration under Section 564(b)(1) of the Act, 21 U.S.C.section 360bbb-3(b)(1), unless the authorization is terminated  or revoked sooner.       Influenza A by PCR NEGATIVE NEGATIVE Final   Influenza B by PCR NEGATIVE NEGATIVE Final    Comment: (NOTE) The Xpert Xpress SARS-CoV-2/FLU/RSV plus assay is intended as an aid in the diagnosis of influenza from Nasopharyngeal swab specimens and should not be used as a sole basis for treatment. Nasal washings and aspirates are unacceptable for Xpert Xpress SARS-CoV-2/FLU/RSV testing.  Fact Sheet for Patients: EntrepreneurPulse.com.au  Fact Sheet for Healthcare Providers: IncredibleEmployment.be  This test is not yet approved or cleared by the [place] FDA and has been authorized for detection and/or diagnosis of SARS-CoV-2 by FDA under an Emergency Use Authorization (EUA). This EUA will remain in effect (meaning this test can be used) for the duration of the COVID-19 declaration under Section 564(b)(1) of the Act, 21 U.S.C. section 360bbb-3(b)(1), unless the authorization is terminated or revoked.  Performed at [facility], [place] [address], [place], [place] [address]     Radiology Reports [place] COMPLETE JOINT SPACE STRUCTURE UP LEFT  Result Date: 08/04/2020 Formatting of this result is different from the original. [place] left shoulder: -Biceps tendon: Well visualized within the bicipital groove and without any abnormalities -Pectoralis: Insertion visualized and without abnormalities. -Subscapularis: Well visualized to insertion point on humerus.  No abnormalities.  Dynamic testing over the coracoid did not show signs of impingement. -AC joint: No osteophytes, no significant separation,  negative geyser sign. -Supraspinatus: Well-visualized and without any abnormalities.  Dynamic testing did not reveal signs of impingement. -Subacromial bursa: No obvious enlargement or swelling. -Infraspinatus/teres minor: Insertion point on  posterior humerus visualized and without abnormalities.   Impression: -Normal ultrasound examination of the left shoulder.  DG Chest Port 1 View  Result Date: 08/12/2020 CLINICAL DATA:  GI bleed. EXAM: PORTABLE CHEST 1 VIEW COMPARISON:  03/28/2020 FINDINGS: The cardiac silhouette, mediastinal and hilar contours is stable. The lungs are clear. No pleural effusions. No pulmonary lesions. The bony thorax is intact. IMPRESSION: No acute cardiopulmonary findings. Electronically Signed   By: Marijo Sanes M.D.   On: 08/12/2020 08:58   DG Shoulder Left  Result Date: 07/28/2020 CLINICAL DATA:  Chronic left shoulder pain EXAM: LEFT SHOULDER - 2+ VIEW COMPARISON:  None. FINDINGS: There is no evidence of fracture or dislocation. Mild osteoarthritis of the glenohumeral and acromioclavicular joints. No soft tissue swelling. Median sternotomy wires are noted. IMPRESSION: Mild osteoarthritis of the left shoulder.  No acute findings. Electronically Signed   By: Davina Poke D.O.   On: 07/28/2020 10:37   CT Angio Abd/Pel w/ and/or w/o  Result Date: 08/12/2020 CLINICAL DATA:  60 year old with melena. Evaluate for lower GI bleed. EXAM: CTA ABDOMEN AND PELVIS WITHOUT AND WITH CONTRAST TECHNIQUE: Multidetector CT imaging of the abdomen and pelvis was performed using the standard protocol during bolus administration of intravenous contrast. Multiplanar reconstructed images and MIPs were obtained and reviewed to evaluate the vascular anatomy. CONTRAST:  158m OMNIPAQUE IOHEXOL 350 MG/ML SOLN COMPARISON:  02/22/2020 FINDINGS: VASCULAR Aorta: Mild atherosclerotic disease in the in the abdominal aorta without aneurysm, dissection or stenosis. Celiac: Patent without evidence of aneurysm,  dissection, vasculitis or significant stenosis. SMA: Patent without evidence of aneurysm, dissection, vasculitis or significant stenosis. Renals: Both renal arteries are patent without evidence of aneurysm, dissection, vasculitis, fibromuscular dysplasia or significant stenosis. IMA: Patent without evidence of aneurysm, dissection, vasculitis or significant stenosis. Inflow: Patent without evidence of aneurysm, dissection, vasculitis or significant stenosis. Proximal Outflow: Proximal femoral arteries are patent bilaterally. Veins: Portal venous system is patent. No gross abnormality to the IVC or renal veins. No gross abnormality to the iliac veins. Review of the MIP images confirms the above findings. NON-VASCULAR Lower chest: Prior median sternotomy.  Lung bases are clear. Hepatobiliary: Diffuse low-attenuation of the liver is suggestive for steatosis. No focal liver lesion. Multiple gallstones without gallbladder inflammation or distension. No significant biliary dilatation. Pancreas: Unremarkable. No pancreatic ductal dilatation or surrounding inflammatory changes. Spleen: Normal in size without focal abnormality. Adrenals/Urinary Tract: Adrenal glands are unremarkable. Kidneys are normal, without renal calculi, focal lesion, or hydronephrosis. Bladder is unremarkable. Stomach/Bowel: Normal appearance of the stomach. Normal appearance of small bowel with focal bowel inflammation or obstruction. Normal appendix. Scattered colonic diverticula without acute inflammation. Again noted is large left inguinal hernia containing portion of the sigmoid colon. There is no evidence for wall thickening or inflammation associated with this large inguinal hernia. No evidence for active GI bleeding. Lymphatic: No lymphadenopathy in the abdomen or pelvis. Reproductive: Prostate is unremarkable. Other: Negative for ascites. Large left inguinal hernia containing sigmoid colon but no inflammatory changes. A tiny umbilical hernia  containing fat. Fat containing right inguinal hernia. Musculoskeletal: No acute bone abnormality. Degenerative facet disease in the lower lumbar spine. IMPRESSION: VASCULAR 1. No evidence for active GI bleeding. 2. Mild atherosclerotic disease in the abdominal aorta. Aortic Atherosclerosis (ICD10-I70.0). 3. Main visceral arteries are patent. NON-VASCULAR 1. No acute abnormalities in the abdomen or pelvis. 2. Again noted is a large left inguinal hernia containing a large portion of the sigmoid colon. No acute inflammatory changes associated with this hernia and  no evidence for bowel obstruction. 3. Colonic diverticulosis. 4. Cholelithiasis. 5. Hepatic steatosis. Electronically Signed   By: Markus Daft M.D.   On: 08/12/2020 14:12

## 2020-08-14 NOTE — Consult Note (Signed)
Chief Complaint: Lower GI bleeding  Referring Physician(s): Dr. Candiss Norse  Supervising Physician: Corrie Mckusick  Patient Status: [facility] - In-pt  History of Present Illness: Colton Guzman. is a 60 y.o. male currently admitted to [facility] via ED admission yesterday for bright blood red per rectum.   Patient had initial BRBPR yesterday morning and he decided to come straight to the eD.  He had prior episode in January of this year, which led to hospitalization during which he had MI and CABG.    He tells me the prior episode in January he never had diagnosis.  He denies any interval bleeding since then.   He was since started on eliquis BID by Dr. Johney Frame for aFib.  He was taking this until yesterday.    He has had several non-painful bloody BM today.    H/H yesterday initially was 13.2/40.5.  Next was 9.8/30.2 .    Patient is currently undergoing resuscitation.   He underwent colonoscopy yesterday which was negative, including negative for blood at the IC.   In the room his vitals are normal.  SBP 120, HR is 70-80.    Currently he is not having any bloody output.      Past Medical History:  Diagnosis Date   CAD (coronary artery disease) of artery bypass graft 02/2020   Diabetes mellitus without complication (HCC)    GI bleed    Hyperlipidemia LDL goal <70    Hypertension    PAF (paroxysmal atrial fibrillation) ([place])     Past Surgical History:  Procedure Laterality Date   COLONOSCOPY WITH PROPOFOL Left 02/27/2020   Procedure: COLONOSCOPY WITH PROPOFOL;  Surgeon: Otis Brace, MD;  Location: [facility];  Service: Gastroenterology;  Laterality: Left;   CORONARY ARTERY BYPASS GRAFT N/A 03/08/2020   Procedure: CORONARY ARTERY BYPASS GRAFTING (CABG) x 5 ON PUMP;  Surgeon: Wonda Olds, MD;  Location: [facility] OR;  Service: Open Heart Surgery;  Laterality: N/A;   ENDOVEIN HARVEST OF GREATER SAPHENOUS VEIN Right 03/08/2020   Procedure: ENDOVEIN HARVEST OF GREATER  SAPHENOUS VEIN;  Surgeon: Wonda Olds, MD;  Location: [place];  Service: Open Heart Surgery;  Laterality: Right;   ESOPHAGOGASTRODUODENOSCOPY (EGD) WITH PROPOFOL N/A 02/27/2020   Procedure: ESOPHAGOGASTRODUODENOSCOPY (EGD) WITH PROPOFOL;  Surgeon: Otis Brace, MD;  Location: [facility];  Service: Gastroenterology;  Laterality: N/A;   LEFT HEART CATH AND CORONARY ANGIOGRAPHY N/A 03/04/2020   Procedure: LEFT HEART CATH AND CORONARY ANGIOGRAPHY;  Surgeon: Burnell Blanks, MD;  Location: [place] [facility];  Service: Cardiovascular;  Laterality: N/A;   RADIAL ARTERY HARVEST Left 03/08/2020   Procedure: RADIAL ARTERY HARVEST;  Surgeon: Wonda Olds, MD;  Location: [place];  Service: Open Heart Surgery;  Laterality: Left;   TEE WITHOUT CARDIOVERSION N/A 03/08/2020   Procedure: TRANSESOPHAGEAL ECHOCARDIOGRAM (TEE);  Surgeon: Wonda Olds, MD;  Location: [place];  Service: Open Heart Surgery;  Laterality: N/A;    Allergies: Patient has no known allergies.  Medications: Prior to Admission medications   Medication Sig Start Date End Date Taking? Authorizing Provider  acetaminophen (TYLENOL) 500 MG tablet Take 1,000 mg by mouth every 4 (four) hours as needed (pain).   Yes [provider]  allopurinol (ZYLOPRIM) 100 MG tablet TAKE 1 TABLET BY MOUTH EVERY DAY 06/13/20  Yes [name], MD  amLODipine (NORVASC) 10 MG tablet Take 10 mg by mouth daily.   Yes [provider]  apixaban (ELIQUIS) 5 MG TABS tablet Take 1  tablet (5 mg total) by mouth 2 (two) times daily. 06/13/20  Yes Freada Bergeron, MD  Ascorbic Acid (VITAMIN C) 1000 MG tablet Take 1,000 mg by mouth daily.   Yes [provider]  aspirin EC 81 MG tablet Take 1 tablet (81 mg total) by mouth daily. Swallow whole. 03/28/20 03/28/21 Yes Wonda Olds, MD  atorvastatin (LIPITOR) 80 MG tablet Take 1 tablet (80 mg total) by mouth daily. 07/28/20  Yes [name], MD  dapagliflozin propanediol  (FARXIGA) 10 MG TABS tablet Take 1 tablet (10 mg total) by mouth daily before breakfast. 05/18/20  Yes [name], MD  Glucagon (GVOKE HYPOPEN 2-PACK) 1 MG/0.2ML SOAJ Inject 1 Act into the skin daily as needed. 05/22/20  Yes [name], MD  isosorbide mononitrate (IMDUR) 30 MG 24 hr tablet Take 1 tablet (30 mg total) by mouth daily. 07/08/20 10/06/20 Yes Freada Bergeron, MD  losartan (COZAAR) 50 MG tablet Take 1 tablet (50 mg total) by mouth daily. 05/17/20  Yes Freada Bergeron, MD  Magnesium 300 MG CAPS Take 300 mg by mouth daily.   Yes [provider]  metFORMIN (GLUCOPHAGE) 1000 MG tablet Take 1 tablet (1,000 mg total) by mouth 2 (two) times daily with a meal. 07/26/20  Yes [name], MD  metoprolol succinate (TOPROL XL) 25 MG 24 hr tablet Take 1 tablet (25 mg total) by mouth daily. 03/13/20 03/13/21 Yes Roddenberry, Arlis Porta, PA-C  Multiple Vitamin (MULTIVITAMIN WITH MINERALS) TABS tablet Take 1 tablet by mouth daily.   Yes [provider]  Semaglutide (RYBELSUS) 3 MG TABS Take 3 mg by mouth daily. 07/26/20 08/25/20 Yes [name], MD  traMADol (ULTRAM) 50 MG tablet Take 1 tablet (50 mg total) by mouth every 6 (six) hours as needed. 07/26/20 07/26/21 Yes [name], MD  Vitamin D, Ergocalciferol, (DRISDOL) 1.25 MG (50000 UNIT) CAPS capsule Take 50,000 Units by mouth every 7 (seven) days. Tuesdays   Yes [provider]     History reviewed. No pertinent family history.  Social History   Socioeconomic History   Marital status: Married    Spouse name: Not on file   Number of children: Not on file   Years of education: Not on file   Highest education level: Not on file  Occupational History   Not on file  Tobacco Use   Smoking status: Never   Smokeless tobacco: Never  Substance and Sexual Activity   Alcohol use: Yes   Drug use: Not on file   Sexual activity: Not on file  Other Topics Concern   Not on file  Social History Narrative   Not  on file   Social Determinants of Health   Financial Resource Strain: Not on file  Food Insecurity: Not on file  Transportation Needs: Not on file  Physical Activity: Not on file  Stress: Not on file  Social Connections: Not on file       Review of Systems: A 12 point ROS discussed and pertinent positives are indicated in the HPI above.  All other systems are negative.  Review of Systems  Vital Signs: BP 120/72 (BP Location: Right Arm)   Pulse 85   Temp 98.6 F (37 C) (Oral)   Resp 19   Ht '5\' 8"'$  (624THL m)   Wt 80.4 kg   SpO2 96%   BMI 26.95 kg/m   Physical Exam General: 60 yo male appearing stated age.  Well-developed, well-nourished.  No distress. HEENT: Atraumatic, normocephalic.  Conjugate gaze, extra-ocular motor intact. No scleral icterus or scleral injection. No lesions on external ears, nose, lips, or gums.  Oral mucosa tacky.  Neck: Symmetric with no goiter enlargement.  Chest/Lungs:  Symmetric chest with inspiration/expiration.  No labored breathing.   Marland Kitchen  Heart:  No JVD appreciated.  Abdomen:  Soft, NT/ND, with + bowel sounds.  Left  inguinal hernia Genito-urinary: Deferred Neurologic: Alert & Oriented to person, place, and time.   Normal affect and insight.  Appropriate questions.  Moving all 4 extremities with gross sensory intact.  Pulse Exam:     Palpable right CFA & left CFA.    Palp right AT & PT.  Palpable left AT & PT.       Imaging: [place] COMPLETE JOINT SPACE STRUCTURE UP LEFT  Result Date: 08/04/2020 Formatting of this result is different from the original. [place] left shoulder: -Biceps tendon: Well visualized within the bicipital groove and without any abnormalities -Pectoralis: Insertion visualized and without abnormalities. -Subscapularis: Well visualized to insertion point on humerus.  No abnormalities.  Dynamic testing over the coracoid did not show signs of impingement. -AC joint: No osteophytes, no significant separation, negative geyser sign.  -Supraspinatus: Well-visualized and without any abnormalities.  Dynamic testing did not reveal signs of impingement. -Subacromial bursa: No obvious enlargement or swelling. -Infraspinatus/teres minor: Insertion point on posterior humerus visualized and without abnormalities.   Impression: -Normal ultrasound examination of the left shoulder.  DG Chest Port 1 View  Result Date: 08/12/2020 CLINICAL DATA:  GI bleed. EXAM: PORTABLE CHEST 1 VIEW COMPARISON:  03/28/2020 FINDINGS: The cardiac silhouette, mediastinal and hilar contours is stable. The lungs are clear. No pleural effusions. No pulmonary lesions. The bony thorax is intact. IMPRESSION: No acute cardiopulmonary findings. Electronically Signed   By: Marijo Sanes M.D.   On: 08/12/2020 08:58   DG Shoulder Left  Result Date: 07/28/2020 CLINICAL DATA:  Chronic left shoulder pain EXAM: LEFT SHOULDER - 2+ VIEW COMPARISON:  None. FINDINGS: There is no evidence of fracture or dislocation. Mild osteoarthritis of the glenohumeral and acromioclavicular joints. No soft tissue swelling. Median sternotomy wires are noted. IMPRESSION: Mild osteoarthritis of the left shoulder.  No acute findings. Electronically Signed   By: Davina Poke D.O.   On: 07/28/2020 10:37   CT Angio Abd/Pel w/ and/or w/o  Result Date: 08/12/2020 CLINICAL DATA:  60 year old with melena. Evaluate for lower GI bleed. EXAM: CTA ABDOMEN AND PELVIS WITHOUT AND WITH CONTRAST TECHNIQUE: Multidetector CT imaging of the abdomen and pelvis was performed using the standard protocol during bolus administration of intravenous contrast. Multiplanar reconstructed images and MIPs were obtained and reviewed to evaluate the vascular anatomy. CONTRAST:  164m OMNIPAQUE IOHEXOL 350 MG/ML SOLN COMPARISON:  02/22/2020 FINDINGS: VASCULAR Aorta: Mild atherosclerotic disease in the in the abdominal aorta without aneurysm, dissection or stenosis. Celiac: Patent without evidence of aneurysm, dissection, vasculitis  or significant stenosis. SMA: Patent without evidence of aneurysm, dissection, vasculitis or significant stenosis. Renals: Both renal arteries are patent without evidence of aneurysm, dissection, vasculitis, fibromuscular dysplasia or significant stenosis. IMA: Patent without evidence of aneurysm, dissection, vasculitis or significant stenosis. Inflow: Patent without evidence of aneurysm, dissection, vasculitis or significant stenosis. Proximal Outflow: Proximal femoral arteries are patent bilaterally. Veins: Portal venous system is patent. No gross abnormality to the IVC or renal veins. No gross abnormality to the iliac veins. Review of the MIP images confirms the above findings. NON-VASCULAR Lower chest: Prior median sternotomy.  Lung bases  are clear. Hepatobiliary: Diffuse low-attenuation of the liver is suggestive for steatosis. No focal liver lesion. Multiple gallstones without gallbladder inflammation or distension. No significant biliary dilatation. Pancreas: Unremarkable. No pancreatic ductal dilatation or surrounding inflammatory changes. Spleen: Normal in size without focal abnormality. Adrenals/Urinary Tract: Adrenal glands are unremarkable. Kidneys are normal, without renal calculi, focal lesion, or hydronephrosis. Bladder is unremarkable. Stomach/Bowel: Normal appearance of the stomach. Normal appearance of small bowel with focal bowel inflammation or obstruction. Normal appendix. Scattered colonic diverticula without acute inflammation. Again noted is large left inguinal hernia containing portion of the sigmoid colon. There is no evidence for wall thickening or inflammation associated with this large inguinal hernia. No evidence for active GI bleeding. Lymphatic: No lymphadenopathy in the abdomen or pelvis. Reproductive: Prostate is unremarkable. Other: Negative for ascites. Large left inguinal hernia containing sigmoid colon but no inflammatory changes. A tiny umbilical hernia containing fat. Fat  containing right inguinal hernia. Musculoskeletal: No acute bone abnormality. Degenerative facet disease in the lower lumbar spine. IMPRESSION: VASCULAR 1. No evidence for active GI bleeding. 2. Mild atherosclerotic disease in the abdominal aorta. Aortic Atherosclerosis (ICD10-I70.0). 3. Main visceral arteries are patent. NON-VASCULAR 1. No acute abnormalities in the abdomen or pelvis. 2. Again noted is a large left inguinal hernia containing a large portion of the sigmoid colon. No acute inflammatory changes associated with this hernia and no evidence for bowel obstruction. 3. Colonic diverticulosis. 4. Cholelithiasis. 5. Hepatic steatosis. Electronically Signed   By: Markus Daft M.D.   On: 08/12/2020 14:12    Labs:  CBC: Recent Labs    08/13/20 0038 08/13/20 1040 08/13/20 2116 08/13/20 2359 08/14/20 0313 08/14/20 1438  WBC 12.4*  --  9.8  --  8.0 7.7  HGB 8.8*   < > 8.4* 7.7* 7.3* 9.4*  HCT 26.0*   < > 24.2* 22.5* 21.3* 27.2*  PLT 139*  --  115*  --  104* 105*   < > = values in this interval not displayed.    COAGS: Recent Labs    02/27/20 0143 02/27/20 0430 03/06/20 0339 03/07/20 2208 03/08/20 1426 05/18/20 1505  INR 1.3*  --  1.2  --  1.4* 1.2*  APTT 99* 47*  --  29 39*  --     BMP: Recent Labs    04/06/20 1130 05/18/20 1505 07/26/20 1144 08/12/20 0727 08/13/20 0038 08/14/20 1438  NA 138   < > 138 132* 134* 136  K 4.2   < > 4.3 4.0 3.7 3.3*  CL 97   < > 98 101 102 102  CO2 22   < > 26 20* 23 25  GLUCOSE 128*   < > 170* 214* 214* 317*  BUN 7   < > '13 13 13 6  '$ CALCIUM 10.1   < > 10.4 9.0 8.2* 8.2*  CREATININE 0.75*   < > 0.81 0.74 0.73 0.78  GFRNONAA 101  --   --  >60 >60 >60  GFRAA 117  --   --   --   --   --    < > = values in this interval not displayed.    LIVER FUNCTION TESTS: Recent Labs    03/07/20 1029 03/08/20 0405 05/18/20 1505 08/12/20 0727  BILITOT 1.2 1.1 0.4 1.0  AST 63* 49* 16 22  ALT 80* 70* 16 27  ALKPHOS 95 100 75 70  PROT 7.6 7.3  8.6* 7.9  ALBUMIN 3.1* 3.0* 4.4 4.1    TUMOR MARKERS: No  results for input(s): AFPTM, CEA, CA199, CHROMGRNA in the last 8760 hours.  Assessment and Plan:  Mr Cameren is a very pleasant 60 yo male with likely lower GI bleeding from diverticular disease.    Currently he is hemodynamically stable, anemic, and undergoing resuscitation and observation.    At this time, I think there is little utility in performing angiogram, as this would likely be negative, and I would not attempt a provocative study at this time.    Given that he is just over 24 hours from his last dose of eliquis, I think the therapeutic effect is waning, and we may see cessation of the bleeding simply from observing.   I did discuss the risks and benefits of mesenteric angiogram with possible embolization, with risks discussed with the patient including, but not limited to bleeding, infection, vascular injury, bowel ischemia, contrast induced renal failure, cardiopulmonary collapse, death.   All of the patient's questions were answered, patient is agreeable to proceed.  Agree with the pending NM GI bleeding study.   Consent is not yet signed given our decision to observe.   Plan: - continue resuscitation - agree with the NM GI bleeding study - VIR is available if there is acute change/deterioration.   Discussed with Dr. Candiss Norse   Thank you for this interesting consult.  I greatly enjoyed meeting [facility]. and look forward to participating in their care.  A copy of this report was sent to the requesting provider on this date.  Electronically Signed: Corrie Mckusick, DO 08/14/2020, 4:58 PM   I spent a total of 87 Mi[place]utes    in face to face in clinical consultation, greater than 50% of which was counseling/coordinating care for lower GI bleeding, possible mesenteric angiogram, possible embolization

## 2020-08-14 NOTE — Progress Notes (Signed)
Patient's wife requested for this nurse to ask for blood transfusion from MD (hgb of 7.7). Discussed with wife; MD notified per request.

## 2020-08-15 ENCOUNTER — Encounter (HOSPITAL_COMMUNITY): Payer: Self-pay | Admitting: Internal Medicine

## 2020-08-15 LAB — COMPREHENSIVE METABOLIC PANEL
ALT: 19 U/L (ref 0–44)
AST: 20 U/L (ref 15–41)
Albumin: 2.8 g/dL — ABNORMAL LOW (ref 3.5–5.0)
Alkaline Phosphatase: 37 U/L — ABNORMAL LOW (ref 38–126)
Anion gap: 8 (ref 5–15)
BUN: 6 mg/dL (ref 6–20)
CO2: 28 mmol/L (ref 22–32)
Calcium: 8.2 mg/dL — ABNORMAL LOW (ref 8.9–10.3)
Chloride: 102 mmol/L (ref 98–111)
Creatinine, Ser: 0.68 mg/dL (ref 0.61–1.24)
GFR, Estimated: >60 mL/min (ref >60)
Glucose, Bld: 207 mg/dL — ABNORMAL HIGH (ref 70–99)
Potassium: 3.1 mmol/L — ABNORMAL LOW (ref 3.5–5.1)
Sodium: 138 mmol/L (ref 135–145)
Total Bilirubin: 0.6 mg/dL (ref 0.3–1.2)
Total Protein: 5.5 g/dL — ABNORMAL LOW (ref 6.5–8.1)

## 2020-08-15 LAB — BPAM RBC
Blood Product Expiration Date: 202207122359
Blood Product Expiration Date: 202207132359
Blood Product Expiration Date: 202207222359
Blood Product Expiration Date: 202207272359
Blood Product Expiration Date: 202207292359
Blood Product Expiration Date: 202207292359
ISSUE DATE / TIME: 202206241832
ISSUE DATE / TIME: 202206250512
ISSUE DATE / TIME: 202206251540
ISSUE DATE / TIME: 202206260504
ISSUE DATE / TIME: 202206260851
ISSUE DATE / TIME: 202206261544
Unit Type and Rh: 5100
Unit Type and Rh: 5100
Unit Type and Rh: 5100
Unit Type and Rh: 5100
Unit Type and Rh: 5100
Unit Type and Rh: 5100

## 2020-08-15 LAB — CBC
HCT: 29.4 % — ABNORMAL LOW (ref 39.0–52.0)
HCT: 29.7 % — ABNORMAL LOW (ref 39.0–52.0)
Hemoglobin: 10.2 g/dL — ABNORMAL LOW (ref 13.0–17.0)
Hemoglobin: 10.3 g/dL — ABNORMAL LOW (ref 13.0–17.0)
MCH: 29.2 pg (ref 26.0–34.0)
MCH: 29.4 pg (ref 26.0–34.0)
MCHC: 34.7 g/dL (ref 30.0–36.0)
MCHC: 34.7 g/dL (ref 30.0–36.0)
MCV: 84.2 fL (ref 80.0–100.0)
MCV: 84.9 fL (ref 80.0–100.0)
Platelets: 104 10*3/uL — ABNORMAL LOW (ref 150–400)
Platelets: 112 10*3/uL — ABNORMAL LOW (ref 150–400)
RBC: 3.49 MIL/uL — ABNORMAL LOW (ref 4.22–5.81)
RBC: 3.5 MIL/uL — ABNORMAL LOW (ref 4.22–5.81)
RDW: 15.1 % (ref 11.5–15.5)
RDW: 15.1 % (ref 11.5–15.5)
WBC: 7.6 10*3/uL (ref 4.0–10.5)
WBC: 6.7 10*3/uL (ref 4.0–10.5)
nRBC: 0 % (ref 0.0–0.2)
nRBC: 0 % (ref 0.0–0.2)

## 2020-08-15 LAB — TYPE AND SCREEN
ABO/RH(D): O POS
Antibody Screen: NEGATIVE
Unit division: 0
Unit division: 0
Unit division: 0
Unit division: 0
Unit division: 0
Unit division: 0

## 2020-08-15 LAB — GLUCOSE, CAPILLARY
Glucose-Capillary: 200 mg/dL — ABNORMAL HIGH (ref 70–99)
Glucose-Capillary: 271 mg/dL — ABNORMAL HIGH (ref 70–99)
Glucose-Capillary: 256 mg/dL — ABNORMAL HIGH (ref 70–99)
Glucose-Capillary: 197 mg/dL — ABNORMAL HIGH (ref 70–99)

## 2020-08-15 LAB — BRAIN NATRIURETIC PEPTIDE: B Natriuretic Peptide: 65.8 pg/mL (ref 0.0–100.0)

## 2020-08-15 LAB — MAGNESIUM: Magnesium: 1.8 mg/dL (ref 1.7–2.4)

## 2020-08-15 MED ORDER — POTASSIUM CHLORIDE CRYS ER 20 MEQ PO TBCR
40.0000 meq | EXTENDED_RELEASE_TABLET | Freq: Once | ORAL | Status: AC
  Administered 2020-08-15: 40 meq via ORAL
  Filled 2020-08-15: qty 2

## 2020-08-15 MED ORDER — POTASSIUM CHLORIDE CRYS ER 20 MEQ PO TBCR
20.0000 meq | EXTENDED_RELEASE_TABLET | Freq: Once | ORAL | Status: AC
  Administered 2020-08-15: 20 meq via ORAL
  Filled 2020-08-15: qty 1

## 2020-08-15 NOTE — Progress Notes (Signed)
PROGRESS NOTE                                                                                                                                                                                                             Patient Demographics:    Colton Guzman, is a 60 y.o. male, DOB - 1960-06-27, [id number]  Outpatient Primary MD for the patient is Colton Lima, MD    LOS - 2  Admit date - 08/12/2020    CC - Blood in stool     Brief Narrative (HPI from H&P)  - Colton Guzman. is a 60 y.o. 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   Subjective:   Patient in bed, appears comfortable, denies any headache, no fever, no chest pain or pressure, no shortness of breath , no abdominal pain. No new focal weakness.   Assessment  & Plan :     Acute L.GIB POA likely diverticular - he has received total of 6 units last transfusion evening of 08/14/2020, will try to keep his hemoglobin around 8 due to significant underlying cardiac history, continue to monitor CBC, he was on anticoagulation for A. fib which has been reversed, currently bowel rest, PPI, GI & IR on board, underwent colonoscopy which did not identify any active bleeding on 08/13/2020, also underwent tagged RBC scan which again was negative.  Continue to monitor H&H on clear liquids.  2. Acute lower GI bleed related symptomatic anemia.  Transfuse and as in #1 above.  3.  Syncope from #1 above.  Stable.  4.  CAD s/p CABG and V. tach arrest.  Currently on statin for secondary prevention, aspirin and Eliquis held for GI bleed.  Once blood  pressure improves low-dose beta-blocker.  Cardiology on board appreciate help, once bleeding has subsided we will try him on 81 mg of aspirin.  No anticoagulation for now post discharge follow-up with primary cardiologist for long-term monitoring.  He has had issues with recurrent bleeding.  5.  Paroxysmal  A. fib [place] vas 2 score of greater than 2.  Was on Eliquis which is held and reversed with Kcentra this admission due to GI bleed.  Resume once he is stable and cleared by GI.  6.  Dyslipidemia.  On statin.  7.  Essential hypertension.  Low-dose beta-blocker once blood pressure stabilizes.  8.  DM type II.  Sliding scale for now.  Recent A1c was 8.3.  Outpatient follow-up with PCP for glycemic control post discharge.  Lab Results  Component Value Date   HGBA1C 8.3 (H) 07/26/2020   CBG (last 3)  Recent Labs    08/14/20 1657 08/14/20 2113 08/15/20 0804  GLUCAP 228* 146* 200*    Obesity: follow with PCP Estimated body mass index is 26.95 kg/m as calculated from the following:   Height as of this encounter: '5\' 8"'$  (1.727 m).   Weight as of this encounter: 80.4 kg.          Condition - Fair  Family Communication  :  wife bedside daily  Code Status :  Full  Consults  :  GI, Cards, IR  PUD Prophylaxis : PPI   Procedures  :     Tagged RBC Scan - No active bleed.  CT - VASCULAR 1. No evidence for active GI bleeding. 2. Mild atherosclerotic disease in the abdominal aorta. Aortic Atherosclerosis (ICD10-I70.0). 3. Main visceral arteries are patent. NON-VASCULAR 1. No acute abnormalities in the abdomen or pelvis. 2. Again noted is a large left inguinal hernia containing a large portion of the sigmoid colon. No acute inflammatory changes associated with this hernia and no evidence for bowel obstruction. 3. Colonic diverticulosis. 4. Cholelithiasis. 5. Hepatic steatosis.      Disposition Plan  :    Status is: Inpatient  Remains inpatient appropriate because:IV treatments  appropriate due to intensity of illness or inability to take PO  Dispo: The patient is from: Home              Anticipated d/c is to: Home              Patient currently is not medically stable to d/c.   Difficult to place patient No  DVT Prophylaxis  :    SCDs Start: 08/12/20 1012  Lab Results  Component Value Date   PLT 112 (L) 08/15/2020    Diet :  Diet Order             Diet full liquid Room service appropriate? Yes; Fluid consistency: Thin  Diet effective now                    Inpatient Medications  Scheduled Meds:  atorvastatin  80 mg Oral Daily   insulin aspart  0-15 Units Subcutaneous TID WC   insulin aspart  0-5 Units Subcutaneous QHS   isosorbide mononitrate  15 mg Oral Daily   magnesium oxide  200 mg Oral Daily   metoprolol succinate  25 mg Oral Daily   pantoprazole (PROTONIX) IV  40 mg Intravenous BID   potassium chloride  20 mEq Oral Once   sodium chloride flush  3 mL Intravenous Q12H   Continuous Infusions:   PRN Meds:.acetaminophen **OR** [DISCONTINUED] acetaminophen, albuterol, nitroGLYCERIN, [DISCONTINUED] ondansetron **OR** ondansetron (ZOFRAN) IV, traMADol  Antibiotics  :    Anti-infectives (From admission, onward)    None        Time Spent in minutes  30   Lala Lund M.D on 08/15/2020 at 10:34 AM  To  page go to [email or web address]   [facility] -  Office  [phone]    See all Orders from today for further details    Objective:   Vitals:   08/14/20 2114 08/15/20 0000 08/15/20 0337 08/15/20 0802  BP: 139/76 130/90 120/70 123/68  Pulse: 83 70 (!) 58 72  Resp: '18 17 18 19  '$ Temp: 97.8 F (36.6 C) 97.8 F (36.6 C) 97.8 F (36.6 C) 98.3 F (36.8 C)  TempSrc: Oral   Oral  SpO2: 100% 96% 95% 97%  Weight:      Height:        Wt Readings from Last 3 Encounters:  08/13/20 80.4 kg  08/03/20 81.6 kg  07/26/20 81.6 kg     Intake/Output Summary (Last 24 hours) at 08/15/2020 1034 Last data filed at 08/15/2020  0900 Gross per 24 hour  Intake 641 ml  Output 3660 ml  Net -3019 ml     Physical Exam  Awake Alert, No new F.N deficits, Normal affect [place].AT,PERRAL Supple Neck,No JVD, No cervical lymphadenopathy appriciated.  Symmetrical Chest wall movement, Good air movement bilaterally, CTAB RRR,No Gallops, Rubs or new Murmurs, No Parasternal Heave +ve B.Sounds, Abd Soft, No tenderness, No organomegaly appriciated, No rebound - guarding or rigidity. No Cyanosis, Clubbing or edema, No new Rash or bruise      Data Review:    CBC Recent Labs  Lab 08/14/20 0313 08/14/20 1438 08/14/20 2129 08/15/20 0459 08/15/20 0856  WBC 8.0 7.7 9.6 7.6 6.7  HGB 7.3* 9.4* 11.4* 10.2* 10.3*  HCT 21.3* 27.2* 32.8* 29.4* 29.7*  PLT 104* 105* 120* 104* 112*  MCV 86.9 85.5 84.3 84.2 84.9  MCH 29.8 29.6 29.3 29.2 29.4  MCHC 34.3 34.6 34.8 34.7 34.7  RDW 15.2 14.9 15.0 15.1 15.1    Recent Labs  Lab 08/12/20 0727 08/13/20 0038 08/14/20 0313 08/14/20 1438 08/15/20 0459  NA 132* 134*  --  136 138  K 4.0 3.7  --  3.3* 3.1*  CL 101 102  --  102 102  CO2 20* 23  --  25 28  GLUCOSE 214* 214*  --  317* 207*  BUN 13 13  --  6 6  CREATININE 0.74 0.73  --  0.78 0.68  CALCIUM 9.0 8.2*  --  8.2* 8.2*  AST 22  --   --   --  20  ALT 27  --   --   --  19  ALKPHOS 70  --   --   --  37*  BILITOT 1.0  --   --   --  0.6  ALBUMIN 4.1  --   --   --  2.8*  MG  --   --  1.8  --  1.8  BNP  --   --  33.9  --  65.8    ------------------------------------------------------------------------------------------------------------------ No results for input(s): CHOL, HDL, LDLCALC, TRIG, CHOLHDL, LDLDIRECT in the last 72 hours.  Lab Results  Component Value Date   HGBA1C 8.3 (H) 07/26/2020   ------------------------------------------------------------------------------------------------------------------ No results for input(s): TSH, T4TOTAL, T3FREE, THYROIDAB in the last 72 hours.  Invalid input(s):  FREET3  Cardiac Enzymes No results for input(s): CKMB, TROPONINI, MYOGLOBIN in the last 168 hours.  Invalid input(s): CK ------------------------------------------------------------------------------------------------------------------    Component Value Date/Time   BNP 65.8 08/15/2020 0459    Micro Results Recent Results (from the past 240 hour(s))  Resp Panel by RT-PCR (Flu A&B, Covid) Nasopharyngeal Swab     Status: None  Collection Time: 08/12/20  8:01 AM   Specimen: Nasopharyngeal Swab; Nasopharyngeal(NP) swabs in vial transport medium  Result Value Ref Range Status   SARS Coronavirus 2 by RT PCR NEGATIVE NEGATIVE Final    Comment: (NOTE) SARS-CoV-2 target nucleic acids are NOT DETECTED.  The SARS-CoV-2 RNA is generally detectable in upper respiratory specimens during the acute phase of infection. The lowest concentration of SARS-CoV-2 viral copies this assay can detect is 138 copies/mL. A negative result does not preclude SARS-Cov-2 infection and should not be used as the sole basis for treatment or other patient management decisions. A negative result may occur with  improper specimen collection/handling, submission of specimen other than nasopharyngeal swab, presence of viral mutation(s) within the areas targeted by this assay, and inadequate number of viral copies(<138 copies/mL). A negative result must be combined with clinical observations, patient history, and epidemiological information. The expected result is Negative.  Fact Sheet for Patients:  EntrepreneurPulse.com.au  Fact Sheet for Healthcare Providers:  IncredibleEmployment.be  This test is no t yet approved or cleared by the [place] FDA and  has been authorized for detection and/or diagnosis of SARS-CoV-2 by FDA under an Emergency Use Authorization (EUA). This EUA will remain  in effect (meaning this test can be used) for the duration of the COVID-19  declaration under Section 564(b)(1) of the Act, 21 U.S.C.section 360bbb-3(b)(1), unless the authorization is terminated  or revoked sooner.       Influenza A by PCR NEGATIVE NEGATIVE Final   Influenza B by PCR NEGATIVE NEGATIVE Final    Comment: (NOTE) The Xpert Xpress SARS-CoV-2/FLU/RSV plus assay is intended as an aid in the diagnosis of influenza from Nasopharyngeal swab specimens and should not be used as a sole basis for treatment. Nasal washings and aspirates are unacceptable for Xpert Xpress SARS-CoV-2/FLU/RSV testing.  Fact Sheet for Patients: EntrepreneurPulse.com.au  Fact Sheet for Healthcare Providers: IncredibleEmployment.be  This test is not yet approved or cleared by the [place] FDA and has been authorized for detection and/or diagnosis of SARS-CoV-2 by FDA under an Emergency Use Authorization (EUA). This EUA will remain in effect (meaning this test can be used) for the duration of the COVID-19 declaration under Section 564(b)(1) of the Act, 21 U.S.C. section 360bbb-3(b)(1), unless the authorization is terminated or revoked.  Performed at [facility], [place] [address], [place], [place] [address]     Radiology Reports NM GI Blood Loss  Result Date: 08/14/2020 CLINICAL DATA:  Lower GI bleed. EXAM: NUCLEAR MEDICINE GASTROINTESTINAL BLEEDING SCAN TECHNIQUE: Sequential abdominal images were obtained following intravenous administration of Tc-9mlabeled red blood cells. RADIOPHARMACEUTICALS:  21.6 mCi Tc-926mertechnetate in-vitro labeled red cells. COMPARISON:  None. FINDINGS: No GI bleed identified. IMPRESSION: Normal study.  No GI bleed identified. Electronically Signed   By: DaDorise BullionII M.D   On: 08/14/2020 21:00   US[place]PLETE JOINT SPACE STRUCTURE UP LEFT  Result Date: 08/04/2020 Formatting of this result is different from the original. US[place]ft shoulder: -Biceps tendon: Well visualized within the bicipital  groove and without any abnormalities -Pectoralis: Insertion visualized and without abnormalities. -Subscapularis: Well visualized to insertion point on humerus.  No abnormalities.  Dynamic testing over the coracoid did not show signs of impingement. -AC joint: No osteophytes, no significant separation, negative geyser sign. -Supraspinatus: Well-visualized and without any abnormalities.  Dynamic testing did not reveal signs of impingement. -Subacromial bursa: No obvious enlargement or swelling. -Infraspinatus/teres minor: Insertion point on posterior humerus visualized and without abnormalities.  Impression: -Normal ultrasound examination of the left shoulder.  DG Chest Port 1 View  Result Date: 08/12/2020 CLINICAL DATA:  GI bleed. EXAM: PORTABLE CHEST 1 VIEW COMPARISON:  03/28/2020 FINDINGS: The cardiac silhouette, mediastinal and hilar contours is stable. The lungs are clear. No pleural effusions. No pulmonary lesions. The bony thorax is intact. IMPRESSION: No acute cardiopulmonary findings. Electronically Signed   By: Marijo Sanes M.D.   On: 08/12/2020 08:58   DG Shoulder Left  Result Date: 07/28/2020 CLINICAL DATA:  Chronic left shoulder pain EXAM: LEFT SHOULDER - 2+ VIEW COMPARISON:  None. FINDINGS: There is no evidence of fracture or dislocation. Mild osteoarthritis of the glenohumeral and acromioclavicular joints. No soft tissue swelling. Median sternotomy wires are noted. IMPRESSION: Mild osteoarthritis of the left shoulder.  No acute findings. Electronically Signed   By: Davina Poke D.O.   On: 07/28/2020 10:37   CT Angio Abd/Pel w/ and/or w/o  Result Date: 08/12/2020 CLINICAL DATA:  60 year old with melena. Evaluate for lower GI bleed. EXAM: CTA ABDOMEN AND PELVIS WITHOUT AND WITH CONTRAST TECHNIQUE: Multidetector CT imaging of the abdomen and pelvis was performed using the standard protocol during bolus administration of intravenous contrast. Multiplanar reconstructed images and MIPs were  obtained and reviewed to evaluate the vascular anatomy. CONTRAST:  12m OMNIPAQUE IOHEXOL 350 MG/ML SOLN COMPARISON:  02/22/2020 FINDINGS: VASCULAR Aorta: Mild atherosclerotic disease in the in the abdominal aorta without aneurysm, dissection or stenosis. Celiac: Patent without evidence of aneurysm, dissection, vasculitis or significant stenosis. SMA: Patent without evidence of aneurysm, dissection, vasculitis or significant stenosis. Renals: Both renal arteries are patent without evidence of aneurysm, dissection, vasculitis, fibromuscular dysplasia or significant stenosis. IMA: Patent without evidence of aneurysm, dissection, vasculitis or significant stenosis. Inflow: Patent without evidence of aneurysm, dissection, vasculitis or significant stenosis. Proximal Outflow: Proximal femoral arteries are patent bilaterally. Veins: Portal venous system is patent. No gross abnormality to the IVC or renal veins. No gross abnormality to the iliac veins. Review of the MIP images confirms the above findings. NON-VASCULAR Lower chest: Prior median sternotomy.  Lung bases are clear. Hepatobiliary: Diffuse low-attenuation of the liver is suggestive for steatosis. No focal liver lesion. Multiple gallstones without gallbladder inflammation or distension. No significant biliary dilatation. Pancreas: Unremarkable. No pancreatic ductal dilatation or surrounding inflammatory changes. Spleen: Normal in size without focal abnormality. Adrenals/Urinary Tract: Adrenal glands are unremarkable. Kidneys are normal, without renal calculi, focal lesion, or hydronephrosis. Bladder is unremarkable. Stomach/Bowel: Normal appearance of the stomach. Normal appearance of small bowel with focal bowel inflammation or obstruction. Normal appendix. Scattered colonic diverticula without acute inflammation. Again noted is large left inguinal hernia containing portion of the sigmoid colon. There is no evidence for wall thickening or inflammation  associated with this large inguinal hernia. No evidence for active GI bleeding. Lymphatic: No lymphadenopathy in the abdomen or pelvis. Reproductive: Prostate is unremarkable. Other: Negative for ascites. Large left inguinal hernia containing sigmoid colon but no inflammatory changes. A tiny umbilical hernia containing fat. Fat containing right inguinal hernia. Musculoskeletal: No acute bone abnormality. Degenerative facet disease in the lower lumbar spine. IMPRESSION: VASCULAR 1. No evidence for active GI bleeding. 2. Mild atherosclerotic disease in the abdominal aorta. Aortic Atherosclerosis (ICD10-I70.0). 3. Main visceral arteries are patent. NON-VASCULAR 1. No acute abnormalities in the abdomen or pelvis. 2. Again noted is a large left inguinal hernia containing a large portion of the sigmoid colon. No acute inflammatory changes associated with this hernia and no evidence for bowel obstruction. 3. Colonic diverticulosis.  4. Cholelithiasis. 5. Hepatic steatosis. Electronically Signed   By: Markus Daft M.D.   On: 08/12/2020 14:12

## 2020-08-15 NOTE — Progress Notes (Addendum)
Progress Note   Subjective  Day #4 Chief Complaint: GI bleed  Today, hemoglobin from 11.4--> 10.3, patient currently on full liquid diet.  Tells me he had a bowel movement late yesterday afternoon around 3/4 PM which was bloody but a smaller amount.  He is requesting more to eat.  Able to go home.   Objective   Vital signs in last 24 hours: Temp:  [97.8 F (36.6 C)-98.6 F (37 C)] 98.3 F (36.8 C) (06/27 0802) Pulse Rate:  [58-95] 72 (06/27 0802) Resp:  [17-19] 19 (06/27 0802) BP: (108-139)/(64-90) 123/68 (06/27 0802) SpO2:  [95 %-100 %] 97 % (06/27 0802) Last BM Date: 08/13/20 General:   male in NAD Heart:  Regular rate and rhythm; no murmurs Lungs: Respirations even and unlabored, lungs CTA bilaterally Abdomen:  Soft, nontender and nondistended. Normal bowel sounds. Extremities:  Without edema. Neurologic:  Alert and oriented,  grossly normal neurologically. Psych:  Cooperative. Normal mood and affect.  Intake/Output from previous day: 06/26 0701 - 06/27 0700 In: 1001 [Blood:1001] Out: 3060 [Urine:3060] Intake/Output this shift: Total I/O In: 240 [P.O.:240] Out: 600 [Urine:600]  Lab Results: Recent Labs    08/14/20 2129 08/15/20 0459 08/15/20 0856  WBC 9.6 7.6 6.7  HGB 11.4* 10.2* 10.3*  HCT 32.8* 29.4* 29.7*  PLT 120* 104* 112*   BMET Recent Labs    08/13/20 0038 08/14/20 1438 08/15/20 0459  NA 134* 136 138  K 3.7 3.3* 3.1*  CL 102 102 102  CO2 '23 25 28  '$ GLUCOSE 214* 317* 207*  BUN '13 6 6  '$ CREATININE 0.73 0.78 0.68  CALCIUM 8.2* 8.2* 8.2*   LFT Recent Labs    08/15/20 0459  PROT 5.5*  ALBUMIN 2.8*  AST 20  ALT 19  ALKPHOS 37*  BILITOT 0.6    Studies/Results: NM GI Blood Loss  Result Date: 08/14/2020 CLINICAL DATA:  Lower GI bleed. EXAM: NUCLEAR MEDICINE GASTROINTESTINAL BLEEDING SCAN TECHNIQUE: Sequential abdominal images were obtained following intravenous administration of Tc-28mlabeled red blood cells. RADIOPHARMACEUTICALS:   21.6 mCi Tc-939mertechnetate in-vitro labeled red cells. COMPARISON:  None. FINDINGS: No GI bleed identified. IMPRESSION: Normal study.  No GI bleed identified. Electronically Signed   By: DaDorise BullionII M.D   On: 08/14/2020 21:00       Assessment / Plan:   Assessment: 1.  Acute lower GI bleed: Felt diverticular in etiology in the setting of Eliquis, seems to be slowly resolving though hemoglobin did drop overnight, colonoscopy 6/25 with multiple small [place] diverticuli in the sigmoid, descending, transverse and ascending with some old residual blood in the left colon with no active bleeding, CT angio 6/24 negative for any active bleeding, large left inguinal hernia containing a portion of sigmoid colon, patient has required 4 units of PRBCs this admission, massive bleeding in January complicated by MI requiring CABG, hemoglobin dropped overnight from 11.4 down to 10.3, nnuc med bleeding scan negative, small amount of hematochezia yesterday afternoon per patient 2.  A. fib: On Eliquis-this has been held and now discontinued by cardiology 3.  CAD status post MI in January 22 with CABG x5  Plan: 1.  Advanced diet to soft this morning 2.  Continue to trend hemoglobin 3.  Please await any further recommendations from Dr. NaSilverio Decampater today, though likely patient cannot be discharged with drop in hemoglobin and continued  Thank you for your kind consultation, we will continue to follow.     LOS: 2 days   JeAnderson Malta  Tasia Catchings  08/15/2020, 11:44 AM   Attending physician's note   I have taken an interval history, reviewed the chart and examined the patient. I agree with the Advanced Practitioner's note, impression and recommendations.   NM bleeding scan negative for active extravasation He has not had any further bleeding since yesterday afternoon  Continue to monitor hemoglobin and transfuse if below 7 Eliquis on hold Advance diet  ?  Diverticular hemorrhage  Patient is very  eager to get discharged, may consider discharge home tomorrow if no further bleeding with stable hemoglobin    I have spent 25 minutes of patient care (this includes precharting, chart review, review of results, face-to-face time used for counseling as well as treatment plan and follow-up. The patient was provided an opportunity to ask questions and all were answered. The patient agreed with the plan and demonstrated an understanding of the instructions.  Damaris Hippo , MD [phone]

## 2020-08-15 NOTE — Plan of Care (Signed)

## 2020-08-16 ENCOUNTER — Ambulatory Visit: Payer: Managed Care, Other (non HMO) | Admitting: Physical Therapy

## 2020-08-16 LAB — CBC WITH DIFFERENTIAL/PLATELET
Abs Immature Granulocytes: 0.04 10*3/uL (ref 0.00–0.07)
Basophils Absolute: 0 10*3/uL (ref 0.0–0.1)
Basophils Relative: 0 %
Eosinophils Absolute: 0.2 10*3/uL (ref 0.0–0.5)
Eosinophils Relative: 3 %
HCT: 27.5 % — ABNORMAL LOW (ref 39.0–52.0)
Hemoglobin: 9.5 g/dL — ABNORMAL LOW (ref 13.0–17.0)
Immature Granulocytes: 1 %
Lymphocytes Relative: 37 %
Lymphs Abs: 2.4 10*3/uL (ref 0.7–4.0)
MCH: 29.6 pg (ref 26.0–34.0)
MCHC: 34.5 g/dL (ref 30.0–36.0)
MCV: 85.7 fL (ref 80.0–100.0)
Monocytes Absolute: 0.5 10*3/uL (ref 0.1–1.0)
Monocytes Relative: 7 %
Neutro Abs: 3.3 10*3/uL (ref 1.7–7.7)
Neutrophils Relative %: 52 %
Platelets: 113 10*3/uL — ABNORMAL LOW (ref 150–400)
RBC: 3.21 MIL/uL — ABNORMAL LOW (ref 4.22–5.81)
RDW: 15 % (ref 11.5–15.5)
WBC: 6.4 10*3/uL (ref 4.0–10.5)
nRBC: 0 % (ref 0.0–0.2)

## 2020-08-16 LAB — GLUCOSE, CAPILLARY
Glucose-Capillary: 294 mg/dL — ABNORMAL HIGH (ref 70–99)
Glucose-Capillary: 250 mg/dL — ABNORMAL HIGH (ref 70–99)
Glucose-Capillary: 184 mg/dL — ABNORMAL HIGH (ref 70–99)
Glucose-Capillary: 285 mg/dL — ABNORMAL HIGH (ref 70–99)

## 2020-08-16 LAB — CBC
HCT: 30.4 % — ABNORMAL LOW (ref 39.0–52.0)
Hemoglobin: 10.1 g/dL — ABNORMAL LOW (ref 13.0–17.0)
MCH: 28.7 pg (ref 26.0–34.0)
MCHC: 33.2 g/dL (ref 30.0–36.0)
MCV: 86.4 fL (ref 80.0–100.0)
Platelets: 147 10*3/uL — ABNORMAL LOW (ref 150–400)
RBC: 3.52 MIL/uL — ABNORMAL LOW (ref 4.22–5.81)
RDW: 15 % (ref 11.5–15.5)
WBC: 6.7 10*3/uL (ref 4.0–10.5)
nRBC: 0 % (ref 0.0–0.2)

## 2020-08-16 LAB — COMPREHENSIVE METABOLIC PANEL
ALT: 21 U/L (ref 0–44)
AST: 21 U/L (ref 15–41)
Albumin: 2.8 g/dL — ABNORMAL LOW (ref 3.5–5.0)
Alkaline Phosphatase: 46 U/L (ref 38–126)
Anion gap: 6 (ref 5–15)
BUN: 6 mg/dL (ref 6–20)
CO2: 26 mmol/L (ref 22–32)
Calcium: 8.6 mg/dL — ABNORMAL LOW (ref 8.9–10.3)
Chloride: 104 mmol/L (ref 98–111)
Creatinine, Ser: 0.8 mg/dL (ref 0.61–1.24)
GFR, Estimated: >60 mL/min (ref >60)
Glucose, Bld: 211 mg/dL — ABNORMAL HIGH (ref 70–99)
Potassium: 3.3 mmol/L — ABNORMAL LOW (ref 3.5–5.1)
Sodium: 136 mmol/L (ref 135–145)
Total Bilirubin: 0.5 mg/dL (ref 0.3–1.2)
Total Protein: 5.7 g/dL — ABNORMAL LOW (ref 6.5–8.1)

## 2020-08-16 LAB — BRAIN NATRIURETIC PEPTIDE: B Natriuretic Peptide: 120.6 pg/mL — ABNORMAL HIGH (ref 0.0–100.0)

## 2020-08-16 LAB — MAGNESIUM: Magnesium: 1.8 mg/dL (ref 1.7–2.4)

## 2020-08-16 MED ORDER — DOCUSATE SODIUM 100 MG PO CAPS
200.0000 mg | ORAL_CAPSULE | Freq: Two times a day (BID) | ORAL | Status: DC
  Administered 2020-08-16 – 2020-08-18 (×5): 200 mg via ORAL
  Filled 2020-08-16 (×5): qty 2

## 2020-08-16 MED ORDER — POTASSIUM CHLORIDE CRYS ER 20 MEQ PO TBCR
40.0000 meq | EXTENDED_RELEASE_TABLET | Freq: Once | ORAL | Status: AC
  Administered 2020-08-16: 40 meq via ORAL
  Filled 2020-08-16: qty 2

## 2020-08-16 MED ORDER — PANTOPRAZOLE SODIUM 40 MG PO TBEC
40.0000 mg | DELAYED_RELEASE_TABLET | Freq: Two times a day (BID) | ORAL | Status: DC
  Administered 2020-08-16 – 2020-08-18 (×4): 40 mg via ORAL
  Filled 2020-08-16 (×4): qty 1

## 2020-08-16 NOTE — Progress Notes (Signed)
PHARMACIST - PHYSICIAN COMMUNICATION  DR:   Candiss Norse  CONCERNING: IV to Oral Route Change Policy  RECOMMENDATION: This patient is receiving Protonix by the intravenous route.  Based on criteria approved by the Pharmacy and Therapeutics Committee, the intravenous medication(s) is/are being converted to the equivalent oral dose form(s).   DESCRIPTION: These criteria include: The patient is eating (either orally or via tube) and/or has been taking other orally administered medications for a least 24 hours The patient has no evidence of active gastrointestinal bleeding or impaired GI absorption (gastrectomy, short bowel, patient on TNA or NPO).  If you have questions about this conversion, please contact the Pharmacy Department  '[]'$   [phone] )  [facility] '[]'$   [phone] )  [facility] '[x]'$   [phone] )  [facility] '[]'$   [phone] )  [facility] '[]'$   [phone] )  [facility]

## 2020-08-16 NOTE — Progress Notes (Signed)
PROGRESS NOTE                                                                                                                                                                                                             Patient Demographics:    Colton Guzman, is a 60 y.o. male, DOB - [birth year], [id number]  Outpatient Primary MD for the patient is [name], MD    LOS - 3  Admit date - 08/12/2020    CC - Blood in stool     Brief Narrative (HPI from H&P)  - Colton Guzman. is a 60 y.o. 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   Subjective:   Patient in bed, appears comfortable, denies any headache, no fever, no chest pain or pressure, no shortness of breath , no abdominal pain. No new focal weakness.    Assessment  & Plan :     Acute L.GIB POA likely diverticular - he has received total of 6 units last transfusion evening of 08/14/2020, will try to keep his hemoglobin around 8 due to significant underlying cardiac history, continue to monitor CBC, he was on anticoagulation for A. fib which has been reversed, currently bowel rest, PPI, GI & IR on board, underwent colonoscopy which did not identify any active bleeding on 08/13/2020, also underwent tagged RBC scan which again was negative.  H&H seems to be stable, he is having some old black stool but no fresh bleeding.  GI now contemplating EGD on 08/17/2020 to rule out upper source as well.  2. Acute lower GI bleed related symptomatic anemia.  Transfuse and as in #1 above.  3.  Syncope from #1 above.  Stable.  4.  CAD s/p CABG and  V. tach arrest.  Currently on statin for secondary prevention, aspirin and Eliquis held for GI bleed.  Once blood pressure improves low-dose beta-blocker.  Cardiology on board appreciate help, once bleeding has subsided we will try him on 81 mg of aspirin.  No  anticoagulation for now post discharge follow-up with primary cardiologist for long-term monitoring.  He has had issues with recurrent bleeding.  5.  Paroxysmal A. fib [place] vas 2 score of greater than 2.  Was on Eliquis which is held and reversed with Kcentra this admission due to GI bleed.  Resume once he is stable and cleared by GI.  6.  Dyslipidemia.  On statin.  7.  Essential hypertension.  Low-dose beta-blocker once blood pressure stabilizes.  8.  Hypokalemia - replaced.  9. DM type II.  Sliding scale for now.  Recent A1c was 8.3.  Outpatient follow-up with PCP for glycemic control post discharge.  Lab Results  Component Value Date   HGBA1C 8.3 (H) 07/26/2020   CBG (last 3)  Recent Labs    08/15/20 1619 08/15/20 2031 08/16/20 0820  GLUCAP 256* 197* 294*    Obesity: follow with PCP Estimated body mass index is 26.95 kg/m as calculated from the following:   Height as of this encounter: '5\' 8"'$  (1.727 m).   Weight as of this encounter: 80.4 kg.          Condition - Fair  Family Communication  :  wife bedside daily  Code Status :  Full  Consults  :  GI, Cards, IR  PUD Prophylaxis : PPI   Procedures  :     Tagged RBC Scan - No active bleed.  CT - VASCULAR 1. No evidence for active GI bleeding. 2. Mild atherosclerotic disease in the abdominal aorta. Aortic Atherosclerosis (ICD10-I70.0). 3. Main visceral arteries are patent. NON-VASCULAR 1. No acute abnormalities in the abdomen or pelvis. 2. Again noted is a large left inguinal hernia containing a large portion of the sigmoid colon. No acute inflammatory changes associated with this hernia and no evidence for bowel obstruction. 3. Colonic diverticulosis. 4.  Cholelithiasis. 5. Hepatic steatosis.      Disposition Plan  :    Status is: Inpatient  Remains inpatient appropriate because:IV treatments appropriate due to intensity of illness or inability to take PO  Dispo: The patient is from: Home              Anticipated d/c is to: Home              Patient currently is not medically stable to d/c.   Difficult to place patient No  DVT Prophylaxis  :    SCDs Start: 08/12/20 1012  Lab Results  Component Value Date   PLT 147 (L) 08/16/2020    Diet :  Diet Order             Diet NPO time specified  Diet effective midnight           DIET SOFT Room service appropriate? Yes; Fluid consistency: Thin  Diet effective now                    Inpatient Medications  Scheduled Meds:  atorvastatin  80 mg Oral Daily   docusate sodium  200 mg Oral BID   insulin aspart  0-15 Units Subcutaneous TID WC   insulin aspart  0-5 Units Subcutaneous QHS   isosorbide mononitrate  15 mg Oral Daily   magnesium oxide  200 mg Oral Daily   metoprolol succinate  25 mg Oral Daily   pantoprazole  40 mg Oral BID   sodium chloride flush  3 mL Intravenous Q12H   Continuous Infusions:   PRN Meds:.acetaminophen **OR** [DISCONTINUED] acetaminophen, albuterol, nitroGLYCERIN, [DISCONTINUED] ondansetron **OR**  ondansetron (ZOFRAN) IV, traMADol  Antibiotics  :    Anti-infectives (From admission, onward)    None        Time Spent in minutes  30   Lala Lund M.D on 08/16/2020 at 11:58 AM  To page go to [email or web address]   [facility] -  Office  [phone]    See all Orders from today for further details    Objective:   Vitals:   08/15/20 1946 08/15/20 2342 08/16/20 0400 08/16/20 0816  BP: 124/81 112/68 120/75 137/81  Pulse: 75 70 74 82  Resp: '20 18 17 18  '$ Temp: 98.4 F (36.9 C) 98.1 F (36.7 C) 97.8 F (36.6 C) 98.4 F (36.9 C)  TempSrc: Oral Oral Oral Oral  SpO2: 100% 97% 96% 98%  Weight:      Height:        Wt  Readings from Last 3 Encounters:  08/13/20 80.4 kg  08/03/20 81.6 kg  07/26/20 81.6 kg     Intake/Output Summary (Last 24 hours) at 08/16/2020 1158 Last data filed at 08/16/2020 1013 Gross per 24 hour  Intake 363 ml  Output 600 ml  Net -237 ml     Physical Exam  Awake Alert, No new F.N deficits, Normal affect [place].AT,PERRAL Supple Neck,No JVD, No cervical lymphadenopathy appriciated.  Symmetrical Chest wall movement, Good air movement bilaterally, CTAB RRR,No Gallops, Rubs or new Murmurs, No Parasternal Heave +ve B.Sounds, Abd Soft, No tenderness, No organomegaly appriciated, No rebound - guarding or rigidity. No Cyanosis, Clubbing or edema, No new Rash or bruise     Data Review:    CBC Recent Labs  Lab 08/14/20 2129 08/15/20 0459 08/15/20 0856 08/16/20 0115 08/16/20 1103  WBC 9.6 7.6 6.7 6.4 6.7  HGB 11.4* 10.2* 10.3* 9.5* 10.1*  HCT 32.8* 29.4* 29.7* 27.5* 30.4*  PLT 120* 104* 112* 113* 147*  MCV 84.3 84.2 84.9 85.7 86.4  MCH 29.3 29.2 29.4 29.6 28.7  MCHC 34.8 34.7 34.7 34.5 33.2  RDW 15.0 15.1 15.1 15.0 15.0  LYMPHSABS  --   --   --  2.4  --   MONOABS  --   --   --  0.5  --   EOSABS  --   --   --  0.2  --   BASOSABS  --   --   --  0.0  --     Recent Labs  Lab 08/12/20 0727 08/13/20 0038 08/14/20 0313 08/14/20 1438 08/15/20 0459 08/16/20 0115  NA 132* 134*  --  136 138 136  K 4.0 3.7  --  3.3* 3.1* 3.3*  CL 101 102  --  102 102 104  CO2 20* 23  --  '25 28 26  '$ GLUCOSE 214* 214*  --  317* 207* 211*  BUN 13 13  --  '6 6 6  '$ CREATININE 0.74 0.73  --  0.78 0.68 0.80  CALCIUM 9.0 8.2*  --  8.2* 8.2* 8.6*  AST 22  --   --   --  20 21  ALT 27  --   --   --  19 21  ALKPHOS 70  --   --   --  37* 46  BILITOT 1.0  --   --   --  0.6 0.5  ALBUMIN 4.1  --   --   --  2.8* 2.8*  MG  --   --  1.8  --  1.8 1.8  BNP  --   --  33.9  --  65.8 120.6*     ------------------------------------------------------------------------------------------------------------------ No results for input(s): CHOL, HDL, LDLCALC, TRIG, CHOLHDL, LDLDIRECT in the last 72 hours.  Lab Results  Component Value Date   HGBA1C 8.3 (H) 07/26/2020   ------------------------------------------------------------------------------------------------------------------ No results for input(s): TSH, T4TOTAL, T3FREE, THYROIDAB in the last 72 hours.  Invalid input(s): FREET3  Cardiac Enzymes No results for input(s): CKMB, TROPONINI, MYOGLOBIN in the last 168 hours.  Invalid input(s): CK ------------------------------------------------------------------------------------------------------------------    Component Value Date/Time   BNP 120.6 (H) 08/16/2020 0115    Micro Results Recent Results (from the past 240 hour(s))  Resp Panel by RT-PCR (Flu A&B, Covid) Nasopharyngeal Swab     Status: None   Collection Time: 08/12/20  8:01 AM   Specimen: Nasopharyngeal Swab; Nasopharyngeal(NP) swabs in vial transport medium  Result Value Ref Range Status   SARS Coronavirus 2 by RT PCR NEGATIVE NEGATIVE Final    Comment: (NOTE) SARS-CoV-2 target nucleic acids are NOT DETECTED.  The SARS-CoV-2 RNA is generally detectable in upper respiratory specimens during the acute phase of infection. The lowest concentration of SARS-CoV-2 viral copies this assay can detect is 138 copies/mL. A negative result does not preclude SARS-Cov-2 infection and should not be used as the sole basis for treatment or other patient management decisions. A negative result may occur with  improper specimen collection/handling, submission of specimen other than nasopharyngeal swab, presence of viral mutation(s) within the areas targeted by this assay, and inadequate number of viral copies(<138 copies/mL). A negative result must be combined with clinical observations, patient history, and  epidemiological information. The expected result is Negative.  Fact Sheet for Patients:  EntrepreneurPulse.com.au  Fact Sheet for Healthcare Providers:  IncredibleEmployment.be  This test is no t yet approved or cleared by the [place] FDA and  has been authorized for detection and/or diagnosis of SARS-CoV-2 by FDA under an Emergency Use Authorization (EUA). This EUA will remain  in effect (meaning this test can be used) for the duration of the COVID-19 declaration under Section 564(b)(1) of the Act, 21 U.S.C.section 360bbb-3(b)(1), unless the authorization is terminated  or revoked sooner.       Influenza A by PCR NEGATIVE NEGATIVE Final   Influenza B by PCR NEGATIVE NEGATIVE Final    Comment: (NOTE) The Xpert Xpress SARS-CoV-2/FLU/RSV plus assay is intended as an aid in the diagnosis of influenza from Nasopharyngeal swab specimens and should not be used as a sole basis for treatment. Nasal washings and aspirates are unacceptable for Xpert Xpress SARS-CoV-2/FLU/RSV testing.  Fact Sheet for Patients: EntrepreneurPulse.com.au  Fact Sheet for Healthcare Providers: IncredibleEmployment.be  This test is not yet approved or cleared by the [place] FDA and has been authorized for detection and/or diagnosis of SARS-CoV-2 by FDA under an Emergency Use Authorization (EUA). This EUA will remain in effect (meaning this test can be used) for the duration of the COVID-19 declaration under Section 564(b)(1) of the Act, 21 U.S.C. section 360bbb-3(b)(1), unless the authorization is terminated or revoked.  Performed at [facility], [address] [place], [place] [address]     Radiology Reports NM GI Blood Loss  Result Date: 08/14/2020 CLINICAL DATA:  Lower GI bleed. EXAM: NUCLEAR MEDICINE GASTROINTESTINAL BLEEDING SCAN TECHNIQUE: Sequential abdominal images were obtained following intravenous  administration of Tc-53mlabeled red blood cells. RADIOPHARMACEUTICALS:  21.6 mCi Tc-933mertechnetate in-vitro labeled red cells. COMPARISON:  None. FINDINGS: No GI bleed identified. IMPRESSION: Normal study.  No GI bleed identified. Electronically Signed   By: DaShanon Brow  Jimmye Norman III M.D   On: 08/14/2020 21:00   [place] COMPLETE JOINT SPACE STRUCTURE UP LEFT  Result Date: 08/04/2020 Formatting of this result is different from the original. [place] left shoulder: -Biceps tendon: Well visualized within the bicipital groove and without any abnormalities -Pectoralis: Insertion visualized and without abnormalities. -Subscapularis: Well visualized to insertion point on humerus.  No abnormalities.  Dynamic testing over the coracoid did not show signs of impingement. -AC joint: No osteophytes, no significant separation, negative geyser sign. -Supraspinatus: Well-visualized and without any abnormalities.  Dynamic testing did not reveal signs of impingement. -Subacromial bursa: No obvious enlargement or swelling. -Infraspinatus/teres minor: Insertion point on posterior humerus visualized and without abnormalities.   Impression: -Normal ultrasound examination of the left shoulder.  DG Chest Port 1 View  Result Date: 08/12/2020 CLINICAL DATA:  GI bleed. EXAM: PORTABLE CHEST 1 VIEW COMPARISON:  03/28/2020 FINDINGS: The cardiac silhouette, mediastinal and hilar contours is stable. The lungs are clear. No pleural effusions. No pulmonary lesions. The bony thorax is intact. IMPRESSION: No acute cardiopulmonary findings. Electronically Signed   By: Marijo Sanes M.D.   On: 08/12/2020 08:58   DG Shoulder Left  Result Date: 07/28/2020 CLINICAL DATA:  Chronic left shoulder pain EXAM: LEFT SHOULDER - 2+ VIEW COMPARISON:  None. FINDINGS: There is no evidence of fracture or dislocation. Mild osteoarthritis of the glenohumeral and acromioclavicular joints. No soft tissue swelling. Median sternotomy wires are noted. IMPRESSION: Mild  osteoarthritis of the left shoulder.  No acute findings. Electronically Signed   By: Davina Poke D.O.   On: 07/28/2020 10:37   CT Angio Abd/Pel w/ and/or w/o  Result Date: 08/12/2020 CLINICAL DATA:  60 year old with melena. Evaluate for lower GI bleed. EXAM: CTA ABDOMEN AND PELVIS WITHOUT AND WITH CONTRAST TECHNIQUE: Multidetector CT imaging of the abdomen and pelvis was performed using the standard protocol during bolus administration of intravenous contrast. Multiplanar reconstructed images and MIPs were obtained and reviewed to evaluate the vascular anatomy. CONTRAST:  166m OMNIPAQUE IOHEXOL 350 MG/ML SOLN COMPARISON:  02/22/2020 FINDINGS: VASCULAR Aorta: Mild atherosclerotic disease in the in the abdominal aorta without aneurysm, dissection or stenosis. Celiac: Patent without evidence of aneurysm, dissection, vasculitis or significant stenosis. SMA: Patent without evidence of aneurysm, dissection, vasculitis or significant stenosis. Renals: Both renal arteries are patent without evidence of aneurysm, dissection, vasculitis, fibromuscular dysplasia or significant stenosis. IMA: Patent without evidence of aneurysm, dissection, vasculitis or significant stenosis. Inflow: Patent without evidence of aneurysm, dissection, vasculitis or significant stenosis. Proximal Outflow: Proximal femoral arteries are patent bilaterally. Veins: Portal venous system is patent. No gross abnormality to the IVC or renal veins. No gross abnormality to the iliac veins. Review of the MIP images confirms the above findings. NON-VASCULAR Lower chest: Prior median sternotomy.  Lung bases are clear. Hepatobiliary: Diffuse low-attenuation of the liver is suggestive for steatosis. No focal liver lesion. Multiple gallstones without gallbladder inflammation or distension. No significant biliary dilatation. Pancreas: Unremarkable. No pancreatic ductal dilatation or surrounding inflammatory changes. Spleen: Normal in size without focal  abnormality. Adrenals/Urinary Tract: Adrenal glands are unremarkable. Kidneys are normal, without renal calculi, focal lesion, or hydronephrosis. Bladder is unremarkable. Stomach/Bowel: Normal appearance of the stomach. Normal appearance of small bowel with focal bowel inflammation or obstruction. Normal appendix. Scattered colonic diverticula without acute inflammation. Again noted is large left inguinal hernia containing portion of the sigmoid colon. There is no evidence for wall thickening or inflammation associated with this large inguinal hernia. No evidence for active GI bleeding. Lymphatic: No  lymphadenopathy in the abdomen or pelvis. Reproductive: Prostate is unremarkable. Other: Negative for ascites. Large left inguinal hernia containing sigmoid colon but no inflammatory changes. A tiny umbilical hernia containing fat. Fat containing right inguinal hernia. Musculoskeletal: No acute bone abnormality. Degenerative facet disease in the lower lumbar spine. IMPRESSION: VASCULAR 1. No evidence for active GI bleeding. 2. Mild atherosclerotic disease in the abdominal aorta. Aortic Atherosclerosis (ICD10-I70.0). 3. Main visceral arteries are patent. NON-VASCULAR 1. No acute abnormalities in the abdomen or pelvis. 2. Again noted is a large left inguinal hernia containing a large portion of the sigmoid colon. No acute inflammatory changes associated with this hernia and no evidence for bowel obstruction. 3. Colonic diverticulosis. 4. Cholelithiasis. 5. Hepatic steatosis. Electronically Signed   By: Markus Daft M.D.   On: 08/12/2020 14:12

## 2020-08-16 NOTE — H&P (View-Only) (Signed)
Progress Note   Subjective  Day #5 Chief Complaint: GI bleed  Today, hemoglobin from 11.4--> 10.3--> 9.5 over the past 48 hours, was on a soft diet overnight and tells me he passed a solid black stool this morning.  He is wondering what we are going to do with him.   Objective   Vital signs in last 24 hours: Temp:  [97.8 F (36.6 C)-98.4 F (36.9 C)] 98.4 F (36.9 C) (06/28 0816) Pulse Rate:  [70-87] 82 (06/28 0816) Resp:  [17-20] 18 (06/28 0816) BP: (109-137)/(62-81) 137/81 (06/28 0816) SpO2:  [96 %-100 %] 98 % (06/28 0816) Last BM Date: 08/14/20 General:   male in NAD Heart:  Regular rate and rhythm; no murmurs Lungs: Respirations even and unlabored, lungs CTA bilaterally Abdomen:  Soft, nontender and nondistended. Normal bowel sounds. Psych:  Cooperative. Normal mood and affect.  Intake/Output from previous day: 06/27 0701 - 06/28 0700 In: 603 [P.O.:600; I.V.:3] Out: 600 [Urine:600] Intake/Output this shift: Total I/O In: -  Out: 600 [Urine:600]  Lab Results: Recent Labs    08/15/20 0459 08/15/20 0856 08/16/20 0115  WBC 7.6 6.7 6.4  HGB 10.2* 10.3* 9.5*  HCT 29.4* 29.7* 27.5*  PLT 104* 112* 113*   BMET Recent Labs    08/14/20 1438 08/15/20 0459 08/16/20 0115  NA 136 138 136  K 3.3* 3.1* 3.3*  CL 102 102 104  CO2 '25 28 26  '$ GLUCOSE 317* 207* 211*  BUN '6 6 6  '$ CREATININE 0.78 0.68 0.80  CALCIUM 8.2* 8.2* 8.6*   LFT Recent Labs    08/16/20 0115  PROT 5.7*  ALBUMIN 2.8*  AST 21  ALT 21  ALKPHOS 46  BILITOT 0.5   Studies/Results: NM GI Blood Loss  Result Date: 08/14/2020 CLINICAL DATA:  Lower GI bleed. EXAM: NUCLEAR MEDICINE GASTROINTESTINAL BLEEDING SCAN TECHNIQUE: Sequential abdominal images were obtained following intravenous administration of Tc-75mlabeled red blood cells. RADIOPHARMACEUTICALS:  21.6 mCi Tc-973mertechnetate in-vitro labeled red cells. COMPARISON:  None. FINDINGS: No GI bleed identified. IMPRESSION: Normal study.  No GI  bleed identified. Electronically Signed   By: DaDorise BullionII M.D   On: 08/14/2020 21:00     Assessment / Plan:   Assessment: 1.  Acute GI bleed: Felt to be diverticular in etiology in the setting of Eliquis, seem to be slowly resolving, though now hemoglobin continues to drop and patient reports melenic stool this morning; need to consider upper GI bleed 2.  A. fib: On Eliquis which has been held and discontinued by cardiology 3.  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  We will make patient n.p.o. at midnight 2.  Plan for EGD tomorrow given melena today per patient.  Did discuss risks, benefits, limitations and alternatives and patient agrees to proceed. 3.  Please continue to monitor hemoglobin with transfusion as needed 4.  Please await further recommendations from Dr. NaSilverio Decampater today.  Thank you for your kind consultation, we will continue to follow.   LOS: 3 days   JeLevin Erp6/28/2022, 11:03 AM   Attending physician's note   I have taken an interval history, reviewed the chart and examined the patient. I agree with the Advanced Practitioner's note, impression and recommendations.   He continues to have downtrending hemoglobin.  He had melanic appearing stool this morning. Though his initial presentation was concerning for acute lower GI hemorrhage or diverticular hemorrhage, it could also have been a brisk upper  GI bleed Given he is having melena and continuing drop in hemoglobin, will plan to proceed with EGD to exclude any upper GI source for blood loss N.p.o. after midnight Monitor hemoglobin and transfuse to >8    I have spent 25 minutes of patient care (this includes precharting, chart review, review of results, face-to-face time used for counseling as well as treatment plan and follow-up. The patient was provided an opportunity to ask questions and all were answered. The patient agreed with the plan and demonstrated an understanding of  the instructions.  Damaris Hippo , MD [phone]

## 2020-08-16 NOTE — Progress Notes (Addendum)
Progress Note   Subjective  Day #5 Chief Complaint: GI bleed  Today, hemoglobin from 11.4--> 10.3--> 9.5 over the past 48 hours, was on a soft diet overnight and tells me he passed a solid black stool this morning.  He is wondering what we are going to do with him.   Objective   Vital signs in last 24 hours: Temp:  [97.8 F (36.6 C)-98.4 F (36.9 C)] 98.4 F (36.9 C) (06/28 0816) Pulse Rate:  [70-87] 82 (06/28 0816) Resp:  [17-20] 18 (06/28 0816) BP: (109-137)/(62-81) 137/81 (06/28 0816) SpO2:  [96 %-100 %] 98 % (06/28 0816) Last BM Date: 08/14/20 General:   male in NAD Heart:  Regular rate and rhythm; no murmurs Lungs: Respirations even and unlabored, lungs CTA bilaterally Abdomen:  Soft, nontender and nondistended. Normal bowel sounds. Psych:  Cooperative. Normal mood and affect.  Intake/Output from previous day: 06/27 0701 - 06/28 0700 In: 603 [P.O.:600; I.V.:3] Out: 600 [Urine:600] Intake/Output this shift: Total I/O In: -  Out: 600 [Urine:600]  Lab Results: Recent Labs    08/15/20 0459 08/15/20 0856 08/16/20 0115  WBC 7.6 6.7 6.4  HGB 10.2* 10.3* 9.5*  HCT 29.4* 29.7* 27.5*  PLT 104* 112* 113*   BMET Recent Labs    08/14/20 1438 08/15/20 0459 08/16/20 0115  NA 136 138 136  K 3.3* 3.1* 3.3*  CL 102 102 104  CO2 '25 28 26  '$ GLUCOSE 317* 207* 211*  BUN '6 6 6  '$ CREATININE 0.78 0.68 0.80  CALCIUM 8.2* 8.2* 8.6*   LFT Recent Labs    08/16/20 0115  PROT 5.7*  ALBUMIN 2.8*  AST 21  ALT 21  ALKPHOS 46  BILITOT 0.5   Studies/Results: NM GI Blood Loss  Result Date: 08/14/2020 CLINICAL DATA:  Lower GI bleed. EXAM: NUCLEAR MEDICINE GASTROINTESTINAL BLEEDING SCAN TECHNIQUE: Sequential abdominal images were obtained following intravenous administration of Tc-44mlabeled red blood cells. RADIOPHARMACEUTICALS:  21.6 mCi Tc-97mertechnetate in-vitro labeled red cells. COMPARISON:  None. FINDINGS: No GI bleed identified. IMPRESSION: Normal study.  No GI  bleed identified. Electronically Signed   By: DaDorise BullionII M.D   On: 08/14/2020 21:00     Assessment / Plan:   Assessment: 1.  Acute GI bleed: Felt to be diverticular in etiology in the setting of Eliquis, seem to be slowly resolving, though now hemoglobin continues to drop and patient reports melenic stool this morning; need to consider upper GI bleed 2.  A. fib: On Eliquis which has been held and discontinued by cardiology 3.  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  We will make patient n.p.o. at midnight 2.  Plan for EGD tomorrow given melena today per patient.  Did discuss risks, benefits, limitations and alternatives and patient agrees to proceed. 3.  Please continue to monitor hemoglobin with transfusion as needed 4.  Please await further recommendations from Dr. NaSilverio Decampater today.  Thank you for your kind consultation, we will continue to follow.   LOS: 3 days   JeLevin Erp6/28/2022, 11:03 AM   Attending physician's note   I have taken an interval history, reviewed the chart and examined the patient. I agree with the Advanced Practitioner's note, impression and recommendations.   He continues to have downtrending hemoglobin.  He had melanic appearing stool this morning. Though his initial presentation was concerning for acute lower GI hemorrhage or diverticular hemorrhage, it could also have been a brisk upper  GI bleed Given he is having melena and continuing drop in hemoglobin, will plan to proceed with EGD to exclude any upper GI source for blood loss N.p.o. after midnight Monitor hemoglobin and transfuse to >8    I have spent 25 minutes of patient care (this includes precharting, chart review, review of results, face-to-face time used for counseling as well as treatment plan and follow-up. The patient was provided an opportunity to ask questions and all were answered. The patient agreed with the plan and demonstrated an understanding of  the instructions.  Damaris Hippo , MD [phone]

## 2020-08-17 ENCOUNTER — Inpatient Hospital Stay (HOSPITAL_COMMUNITY): Payer: Managed Care, Other (non HMO) | Admitting: Certified Registered"

## 2020-08-17 ENCOUNTER — Encounter (HOSPITAL_COMMUNITY)
Admission: EM | Disposition: A | Discharge status: Home / Self Care | Payer: Self-pay | Source: Home / Self Care | Attending: Internal Medicine

## 2020-08-17 ENCOUNTER — Encounter (HOSPITAL_COMMUNITY): Payer: Self-pay | Admitting: Internal Medicine

## 2020-08-17 DIAGNOSIS — K259 Gastric ulcer, unspecified as acute or chronic, without hemorrhage or perforation: Secondary | ICD-10-CM

## 2020-08-17 DIAGNOSIS — K2900 Acute gastritis without bleeding: Secondary | ICD-10-CM

## 2020-08-17 HISTORY — PX: BIOPSY: SHX5522

## 2020-08-17 HISTORY — PX: ESOPHAGOGASTRODUODENOSCOPY (EGD) WITH PROPOFOL: SHX5813

## 2020-08-17 LAB — COMPREHENSIVE METABOLIC PANEL
ALT: 24 U/L (ref 0–44)
AST: 22 U/L (ref 15–41)
Albumin: 2.9 g/dL — ABNORMAL LOW (ref 3.5–5.0)
Alkaline Phosphatase: 54 U/L (ref 38–126)
Anion gap: 6 (ref 5–15)
BUN: 8 mg/dL (ref 6–20)
CO2: 26 mmol/L (ref 22–32)
Calcium: 8.8 mg/dL — ABNORMAL LOW (ref 8.9–10.3)
Chloride: 105 mmol/L (ref 98–111)
Creatinine, Ser: 0.9 mg/dL (ref 0.61–1.24)
GFR, Estimated: >60 mL/min (ref >60)
Glucose, Bld: 322 mg/dL — ABNORMAL HIGH (ref 70–99)
Potassium: 3.6 mmol/L (ref 3.5–5.1)
Sodium: 137 mmol/L (ref 135–145)
Total Bilirubin: 0.5 mg/dL (ref 0.3–1.2)
Total Protein: 5.8 g/dL — ABNORMAL LOW (ref 6.5–8.1)

## 2020-08-17 LAB — CBC WITH DIFFERENTIAL/PLATELET
Abs Immature Granulocytes: 0.05 10*3/uL (ref 0.00–0.07)
Basophils Absolute: 0 10*3/uL (ref 0.0–0.1)
Basophils Relative: 1 %
Eosinophils Absolute: 0.2 10*3/uL (ref 0.0–0.5)
Eosinophils Relative: 3 %
HCT: 27 % — ABNORMAL LOW (ref 39.0–52.0)
Hemoglobin: 9 g/dL — ABNORMAL LOW (ref 13.0–17.0)
Immature Granulocytes: 1 %
Lymphocytes Relative: 40 %
Lymphs Abs: 2.5 10*3/uL (ref 0.7–4.0)
MCH: 28.9 pg (ref 26.0–34.0)
MCHC: 33.3 g/dL (ref 30.0–36.0)
MCV: 86.8 fL (ref 80.0–100.0)
Monocytes Absolute: 0.4 10*3/uL (ref 0.1–1.0)
Monocytes Relative: 7 %
Neutro Abs: 3.1 10*3/uL (ref 1.7–7.7)
Neutrophils Relative %: 48 %
Platelets: 150 10*3/uL (ref 150–400)
RBC: 3.11 MIL/uL — ABNORMAL LOW (ref 4.22–5.81)
RDW: 15 % (ref 11.5–15.5)
WBC: 6.3 10*3/uL (ref 4.0–10.5)
nRBC: 0 % (ref 0.0–0.2)

## 2020-08-17 LAB — MAGNESIUM: Magnesium: 1.9 mg/dL (ref 1.7–2.4)

## 2020-08-17 LAB — GLUCOSE, CAPILLARY
Glucose-Capillary: 211 mg/dL — ABNORMAL HIGH (ref 70–99)
Glucose-Capillary: 164 mg/dL — ABNORMAL HIGH (ref 70–99)
Glucose-Capillary: 261 mg/dL — ABNORMAL HIGH (ref 70–99)

## 2020-08-17 LAB — BRAIN NATRIURETIC PEPTIDE: B Natriuretic Peptide: 128.6 pg/mL — ABNORMAL HIGH (ref 0.0–100.0)

## 2020-08-17 SURGERY — ESOPHAGOGASTRODUODENOSCOPY (EGD) WITH PROPOFOL
Anesthesia: Monitor Anesthesia Care

## 2020-08-17 MED ORDER — LACTATED RINGERS IV SOLN: INTRAVENOUS | Status: DC | PRN

## 2020-08-17 MED ORDER — PHENYLEPHRINE 40 MCG/ML (10ML) SYRINGE FOR IV PUSH (FOR BLOOD PRESSURE SUPPORT)
PREFILLED_SYRINGE | INTRAVENOUS | Status: DC | PRN
  Administered 2020-08-17 (×2): 80 ug via INTRAVENOUS

## 2020-08-17 MED ORDER — PROPOFOL 10 MG/ML IV BOLUS
INTRAVENOUS | Status: DC | PRN
  Administered 2020-08-17: 100 mg via INTRAVENOUS

## 2020-08-17 MED ORDER — PROPOFOL 500 MG/50ML IV EMUL
INTRAVENOUS | Status: DC | PRN
  Administered 2020-08-17: 200 ug/kg/min via INTRAVENOUS

## 2020-08-17 MED ORDER — LIDOCAINE 2% (20 MG/ML) 5 ML SYRINGE
INTRAMUSCULAR | Status: DC | PRN
  Administered 2020-08-17: 100 mg via INTRAVENOUS

## 2020-08-17 SURGICAL SUPPLY — 15 items

## 2020-08-17 NOTE — Transfer of Care (Signed)
Immediate Anesthesia Transfer of Care Note  Patient: Colton Guzman.  Procedure(s) Performed: ESOPHAGOGASTRODUODENOSCOPY (EGD) WITH PROPOFOL BIOPSY  Patient Location: PACU  Anesthesia Type:MAC  Level of Consciousness: awake, alert  and oriented  Airway & Oxygen Therapy: Patient Spontanous Breathing  Post-op Assessment: Report given to RN and Post -op Vital signs reviewed and stable  Post vital signs: Reviewed and stable  Last Vitals:  Vitals Value Taken Time  BP 90/65 08/17/20 0944  Temp    Pulse 89 08/17/20 0946  Resp 19 08/17/20 0946  SpO2 100 % 08/17/20 0946  Vitals shown include unvalidated device data.  Last Pain:  Vitals:   08/17/20 0945  TempSrc:   PainSc: 0-No pain      Patients Stated Pain Goal: 3 (57/90/38 [id number])  Complications: No notable events documented.

## 2020-08-17 NOTE — Anesthesia Postprocedure Evaluation (Signed)
Anesthesia Post Note  Patient: Colton Guzman.  Procedure(s) Performed: ESOPHAGOGASTRODUODENOSCOPY (EGD) WITH PROPOFOL BIOPSY     Patient location during evaluation: Endoscopy Anesthesia Type: MAC Level of consciousness: awake and alert Pain management: pain level controlled Vital Signs Assessment: post-procedure vital signs reviewed and stable Respiratory status: spontaneous breathing, nonlabored ventilation, respiratory function stable and patient connected to nasal cannula oxygen Cardiovascular status: stable and blood pressure returned to baseline Postop Assessment: no apparent nausea or vomiting Anesthetic complications: no   No notable events documented.  Last Vitals:  Vitals:   08/17/20 1010 08/17/20 1030  BP: 136/85 128/90  Pulse: 81 63  Resp: (!) 26 15  Temp:  36.8 C  SpO2: 100% 100%    Last Pain:  Vitals:   08/17/20 1030  TempSrc: Oral  PainSc:                  March Rummage Kaelem Brach

## 2020-08-17 NOTE — Anesthesia Preprocedure Evaluation (Addendum)
Anesthesia Evaluation  Patient identified by MRN, date of birth, ID band Patient awake    Reviewed: Allergy & Precautions, NPO status , Patient's Chart, lab work & pertinent test results  Airway Mallampati: II  TM Distance: >3 FB Neck ROM: Full    Dental  (+) Upper Dentures, Lower Dentures   Pulmonary neg pulmonary ROS,    Pulmonary exam normal        Cardiovascular hypertension, Pt. on medications and Pt. on home beta blockers + CAD  + dysrhythmias Atrial Fibrillation  Rhythm:Regular Rate:Normal     Neuro/Psych negative neurological ROS  negative psych ROS   GI/Hepatic negative GI ROS, Neg liver ROS,   Endo/Other  diabetes, Type 2, Oral Hypoglycemic Agents  Renal/GU negative Renal ROS  negative genitourinary   Musculoskeletal  (+) Arthritis ,   Abdominal (+)  Abdomen: soft. Bowel sounds: normal.  Peds  Hematology  (+) anemia ,   Anesthesia Other Findings   Reproductive/Obstetrics                             Anesthesia Physical Anesthesia Plan  ASA: 2  Anesthesia Plan: MAC   Post-op Pain Management:    Induction: Intravenous  PONV Risk Score and Plan: 1 and Propofol infusion  Airway Management Planned: Simple Face Mask, Natural Airway and Nasal Cannula  Additional Equipment: None  Intra-op Plan:   Post-operative Plan:   Informed Consent: I have reviewed the patients History and Physical, chart, labs and discussed the procedure including the risks, benefits and alternatives for the proposed anesthesia with the patient or authorized representative who has indicated his/her understanding and acceptance.     Dental advisory given  Plan Discussed with: CRNA  Anesthesia Plan Comments:         Anesthesia Quick Evaluation

## 2020-08-17 NOTE — Op Note (Signed)
[facility] Patient Name: Colton Guzman Procedure Date : 08/17/2020 MRN: [id number] Attending MD: Mauri Pole , MD Date of Birth: 05/29/1960 CSN: [id number] Age: 60 Admit Type: Inpatient Procedure:                Upper GI endoscopy Indications:              Recent gastrointestinal bleeding, Suspected upper                            gastrointestinal bleeding, Gastrointestinal                            bleeding of unknown origin Providers:                Mauri Pole, MD, Nelia Shi, RN,                            Laverda Sorenson, Technician Referring MD:             Dulcy Fanny. Wagner Medicines:                Monitored Anesthesia Care Complications:            No immediate complications. Estimated Blood Loss:     Estimated blood loss was minimal. Procedure:                Pre-Anesthesia Assessment:                           - Prior to the procedure, a History and Physical                            was performed, and patient medications and                            allergies were reviewed. The patient's tolerance of                            previous anesthesia was also reviewed. The risks                            and benefits of the procedure and the sedation                            options and risks were discussed with the patient.                            All questions were answered, and informed consent                            was obtained. Prior Anticoagulants: The patient                            last took Eliquis (apixaban) 7 days prior to the  procedure. ASA Grade Assessment: III - A patient                            with severe systemic disease. After reviewing the                            risks and benefits, the patient was deemed in                            satisfactory condition to undergo the procedure.                           After obtaining informed consent, the endoscope was                             passed under direct vision. Throughout the                            procedure, the patient's blood pressure, pulse, and                            oxygen saturations were monitored continuously. The                            [id number]) Olympus gastroscope was                            introduced through the mouth, and advanced to the                            second part of duodenum. The upper GI endoscopy was                            accomplished without difficulty. The patient                            tolerated the procedure well. Scope In: Scope Out: Findings:      The Z-line was regular and was found 40 cm from the incisors.      No gross lesions were noted in the entire esophagus.      Few non-bleeding linear and superficial gastric ulcers with no stigmata       of bleeding were found in the gastric antrum and in the prepyloric       region of the stomach. The largest lesion was 4 mm in largest dimension.       Biopsies were taken with a cold forceps for Helicobacter pylori testing.      The cardia and gastric fundus were normal on retroflexion.      The examined duodenum was normal. Impression:               - Z-line regular, 40 cm from the incisors.                           - No gross lesions in esophagus.                           -  Non-bleeding gastric ulcers with no stigmata of                            bleeding. Biopsied.                           - Normal examined duodenum. Recommendation:           - Patient has a contact number available for                            emergencies. The signs and symptoms of potential                            delayed complications were discussed with the                            patient. Return to normal activities tomorrow.                            Written discharge instructions were provided to the                            patient.                           - Resume previous diet.                            - Continue present medications.                           - Await pathology results.                           - No ibuprofen, naproxen, or other non-steroidal                            anti-inflammatory drugs.                           - Use Protonix (pantoprazole) 40 mg PO daily.                           - Resume Eliquis (apixaban) at prior dose tomorrow.                            Refer to managing physician for further adjustment                            of therapy.                           - Ok to DC home tomorrow from GI standpoint                           - GI will sign off, available if have  any questions Procedure Code(s):        --- Professional ---                           [phone], Esophagogastroduodenoscopy, flexible,                            transoral; with biopsy, single or multiple Diagnosis Code(s):        --- Professional ---                           K25.9, Gastric ulcer, unspecified as acute or                            chronic, without hemorrhage or perforation                           K92.2, Gastrointestinal hemorrhage, unspecified CPT copyright 2019 [employer]. All rights reserved. The codes documented in this report are preliminary and upon coder review may  be revised to meet current compliance requirements. Mauri Pole, MD 08/17/2020 9:53:00 AM This report has been signed electronically. Number of Addenda: 0

## 2020-08-17 NOTE — Interval H&P Note (Signed)
History and Physical Interval Note:  08/17/2020 8:26 AM  Colton Guzman.  has presented today for surgery, with the diagnosis of Melena, anemia.  The various methods of treatment have been discussed with the patient and family. After consideration of risks, benefits and other options for treatment, the patient has consented to  Procedure(s): ESOPHAGOGASTRODUODENOSCOPY (EGD) WITH PROPOFOL (N/A) as a surgical intervention.  The patient's history has been reviewed, patient examined, no change in status, stable for surgery.  I have reviewed the patient's chart and labs.  Questions were answered to the patient's satisfaction.     [name]

## 2020-08-17 NOTE — Progress Notes (Signed)
PROGRESS NOTE                                                                                                                                                                                                             Patient Demographics:    Colton Guzman, is a 60 y.o. male, DOB - October 05, 1960, [id number]  Outpatient Primary MD for the patient is [name], MD    LOS - 4  Admit date - 08/12/2020    CC - Blood in stool     Brief Narrative (HPI from H&P)  - Colton Guzman. is a 60 y.o. 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   Subjective:   Patient in bed, appears comfortable, denies any headache, no fever, no chest pain or pressure, no shortness of breath , no abdominal pain. No new focal weakness.    Assessment  & Plan :     Acute GI bleed, likely lower in the setting of diverticular bleed, possibly due to gastric ulcers  - he has received total of 6 units last transfusion evening of 08/14/2020, will try to keep his hemoglobin around 8 due to significant underlying cardiac history, continue to monitor CBC, he was on anticoagulation for A. fib which has been reversed, currently bowel rest, PPI, GI & IR on board, underwent colonoscopy which was significant for severe diverticulosis, but did not identify any active bleeding on 08/13/2020, also underwent tagged RBC scan which again was negative.  H&H seems to be stable, he is having some old black stool but no fresh bleeding.   EGD on 08/17/2020 significant for small nonbleeding gastric ulcers.  2. Acute GI bleed related symptomatic  anemia.  Transfuse and as in #1 above.Hgb remains stable post transfusion  3.  Syncope from #1 above.  Stable.  4.  CAD s/p CABG and V. tach arrest.  Currently on statin for secondary prevention, aspirin and Eliquis held for GI bleed.  Once blood pressure improves low-dose beta-blocker.  Can resume  Eliquis tomorrow if hemoglobin is stable and no evidence of recurrent GI bleed per GI recommendations  5.  Paroxysmal A. fib [place] vas 2 score of greater than 2.  Was on Eliquis which is held and reversed with Kcentra this admission due to GI bleed.  Would resume Eliquis tomorrow  6.  Dyslipidemia.  On statin.  7.  Essential hypertension.  Low-dose beta-blocker once blood pressure stabilizes.  8.  Hypokalemia - replaced.  9. DM type II.  Sliding scale for now.  Recent A1c was 8.3.  Outpatient follow-up with PCP for glycemic control post discharge.  Lab Results  Component Value Date   HGBA1C 8.3 (H) 07/26/2020   CBG (last 3)  Recent Labs    08/16/20 2055 08/17/20 0806 08/17/20 1157  GLUCAP 285* 211* 164*    Obesity: follow with PCP Estimated body mass index is 26.95 kg/m as calculated from the following:   Height as of this encounter: '5\' 8"'$  (1.727 m).   Weight as of this encounter: 80.4 kg.          Condition - Fair  Family Communication  :  none at bedside  Code Status :  Full  Consults  :  GI, Cards, IR  PUD Prophylaxis : PPI   Procedures  :     Tagged RBC Scan - No active bleed.  CT - VASCULAR 1. No evidence for active GI bleeding. 2. Mild atherosclerotic disease in the abdominal aorta. Aortic Atherosclerosis (ICD10-I70.0). 3. Main visceral arteries are patent. NON-VASCULAR 1. No acute abnormalities in the abdomen or pelvis. 2. Again noted is a large left inguinal hernia containing a large portion of the sigmoid colon. No acute inflammatory changes associated with this hernia and no evidence for bowel obstruction. 3. Colonic diverticulosis. 4. Cholelithiasis. 5.  Hepatic steatosis.      Disposition Plan  :    Status is: Inpatient  Remains inpatient appropriate because:IV treatments appropriate due to intensity of illness or inability to take PO  Dispo: The patient is from: Home              Anticipated d/c is to: Home              Patient currently is not medically stable to d/c.   Difficult to place patient No  DVT Prophylaxis  :    SCDs Start: 08/12/20 1012  Lab Results  Component Value Date   PLT 150 08/17/2020    Diet :  Diet Order             Diet heart healthy/carb modified Room service appropriate? Yes; Fluid consistency: Thin  Diet effective now                    Inpatient Medications  Scheduled Meds:  atorvastatin  80 mg Oral Daily   docusate sodium  200 mg Oral BID   insulin aspart  0-15 Units Subcutaneous TID WC   insulin aspart  0-5 Units Subcutaneous QHS   isosorbide mononitrate  15 mg Oral Daily   magnesium oxide  200 mg Oral Daily   metoprolol succinate  25 mg Oral Daily   pantoprazole  40 mg Oral BID   sodium chloride flush  3 mL Intravenous Q12H   Continuous Infusions:   PRN Meds:.acetaminophen **OR** [DISCONTINUED] acetaminophen, albuterol, nitroGLYCERIN, [DISCONTINUED] ondansetron **OR** ondansetron (ZOFRAN) IV, traMADol  Antibiotics  :    Anti-infectives (From admission, onward)    None  Phillips Climes M.D on 08/17/2020 at 1:11 PM  To page go to [email or web address]   [facility] -  Office  [phone]    See all Orders from today for further details    Objective:   Vitals:   08/17/20 1000 08/17/20 1010 08/17/20 1030 08/17/20 1158  BP: 125/88 136/85 128/90 123/72  Pulse: 83 81 63 77  Resp: 19 (!) 26 15   Temp:   98.2 F (36.8 C) 98.4 F (36.9 C)  TempSrc:   Oral Oral  SpO2: 100% 100% 100% 98%  Weight:      Height:        Wt Readings from Last 3 Encounters:  08/13/20 80.4 kg  08/03/20 81.6 kg  07/26/20 81.6 kg     Intake/Output Summary (Last 24  hours) at 08/17/2020 1311 Last data filed at 08/17/2020 0936 Gross per 24 hour  Intake 365 ml  Output --  Net 365 ml     Physical Exam  Awake Alert, Oriented X 3, No new F.N deficits, Normal affect Symmetrical Chest wall movement, Good air movement bilaterally, CTAB RRR,No Gallops,Rubs or new Murmurs, No Parasternal Heave +ve B.Sounds, Abd Soft, No tenderness, No rebound - guarding or rigidity. No Cyanosis, Clubbing or edema, No new Rash or bruise        Data Review:    CBC Recent Labs  Lab 08/15/20 0459 08/15/20 0856 08/16/20 0115 08/16/20 1103 08/17/20 0046  WBC 7.6 6.7 6.4 6.7 6.3  HGB 10.2* 10.3* 9.5* 10.1* 9.0*  HCT 29.4* 29.7* 27.5* 30.4* 27.0*  PLT 104* 112* 113* 147* 150  MCV 84.2 84.9 85.7 86.4 86.8  MCH 29.2 29.4 29.6 28.7 28.9  MCHC 34.7 34.7 34.5 33.2 33.3  RDW 15.1 15.1 15.0 15.0 15.0  LYMPHSABS  --   --  2.4  --  2.5  MONOABS  --   --  0.5  --  0.4  EOSABS  --   --  0.2  --  0.2  BASOSABS  --   --  0.0  --  0.0    Recent Labs  Lab 08/12/20 0727 08/13/20 0038 08/14/20 0313 08/14/20 1438 08/15/20 0459 08/16/20 0115 08/17/20 0046  NA 132* 134*  --  136 138 136 137  K 4.0 3.7  --  3.3* 3.1* 3.3* 3.6  CL 101 102  --  102 102 104 105  CO2 20* 23  --  '25 28 26 26  '$ GLUCOSE 214* 214*  --  317* 207* 211* 322*  BUN 13 13  --  '6 6 6 8  '$ CREATININE 0.74 0.73  --  0.78 0.68 0.80 0.90  CALCIUM 9.0 8.2*  --  8.2* 8.2* 8.6* 8.8*  AST 22  --   --   --  '20 21 22  '$ ALT 27  --   --   --  '19 21 24  '$ ALKPHOS 70  --   --   --  37* 46 54  BILITOT 1.0  --   --   --  0.6 0.5 0.5  ALBUMIN 4.1  --   --   --  2.8* 2.8* 2.9*  MG  --   --  1.8  --  1.8 1.8 1.9  BNP  --   --  33.9  --  65.8 120.6* 128.6*    ------------------------------------------------------------------------------------------------------------------ No results for input(s): CHOL, HDL, LDLCALC, TRIG, CHOLHDL, LDLDIRECT in the last 72 hours.  Lab Results  Component Value Date   HGBA1C 8.3 (H)  07/26/2020   ------------------------------------------------------------------------------------------------------------------  No results for input(s): TSH, T4TOTAL, T3FREE, THYROIDAB in the last 72 hours.  Invalid input(s): FREET3  Cardiac Enzymes No results for input(s): CKMB, TROPONINI, MYOGLOBIN in the last 168 hours.  Invalid input(s): CK ------------------------------------------------------------------------------------------------------------------    Component Value Date/Time   BNP 128.6 (H) 08/17/2020 0046    Micro Results Recent Results (from the past 240 hour(s))  Resp Panel by RT-PCR (Flu A&B, Covid) Nasopharyngeal Swab     Status: None   Collection Time: 08/12/20  8:01 AM   Specimen: Nasopharyngeal Swab; Nasopharyngeal(NP) swabs in vial transport medium  Result Value Ref Range Status   SARS Coronavirus 2 by RT PCR NEGATIVE NEGATIVE Final    Comment: (NOTE) SARS-CoV-2 target nucleic acids are NOT DETECTED.  The SARS-CoV-2 RNA is generally detectable in upper respiratory specimens during the acute phase of infection. The lowest concentration of SARS-CoV-2 viral copies this assay can detect is 138 copies/mL. A negative result does not preclude SARS-Cov-2 infection and should not be used as the sole basis for treatment or other patient management decisions. A negative result may occur with  improper specimen collection/handling, submission of specimen other than nasopharyngeal swab, presence of viral mutation(s) within the areas targeted by this assay, and inadequate number of viral copies(<138 copies/mL). A negative result must be combined with clinical observations, patient history, and epidemiological information. The expected result is Negative.  Fact Sheet for Patients:  EntrepreneurPulse.com.au  Fact Sheet for Healthcare Providers:  IncredibleEmployment.be  This test is no t yet approved or cleared by the [place]  FDA and  has been authorized for detection and/or diagnosis of SARS-CoV-2 by FDA under an Emergency Use Authorization (EUA). This EUA will remain  in effect (meaning this test can be used) for the duration of the COVID-19 declaration under Section 564(b)(1) of the Act, 21 U.S.C.section 360bbb-3(b)(1), unless the authorization is terminated  or revoked sooner.       Influenza A by PCR NEGATIVE NEGATIVE Final   Influenza B by PCR NEGATIVE NEGATIVE Final    Comment: (NOTE) The Xpert Xpress SARS-CoV-2/FLU/RSV plus assay is intended as an aid in the diagnosis of influenza from Nasopharyngeal swab specimens and should not be used as a sole basis for treatment. Nasal washings and aspirates are unacceptable for Xpert Xpress SARS-CoV-2/FLU/RSV testing.  Fact Sheet for Patients: EntrepreneurPulse.com.au  Fact Sheet for Healthcare Providers: IncredibleEmployment.be  This test is not yet approved or cleared by the [place] FDA and has been authorized for detection and/or diagnosis of SARS-CoV-2 by FDA under an Emergency Use Authorization (EUA). This EUA will remain in effect (meaning this test can be used) for the duration of the COVID-19 declaration under Section 564(b)(1) of the Act, 21 U.S.C. section 360bbb-3(b)(1), unless the authorization is terminated or revoked.  Performed at [facility], [place] [address], [place], [place] [address]     Radiology Reports NM GI Blood Loss  Result Date: 08/14/2020 CLINICAL DATA:  Lower GI bleed. EXAM: NUCLEAR MEDICINE GASTROINTESTINAL BLEEDING SCAN TECHNIQUE: Sequential abdominal images were obtained following intravenous administration of Tc-58mlabeled red blood cells. RADIOPHARMACEUTICALS:  21.6 mCi Tc-95mertechnetate in-vitro labeled red cells. COMPARISON:  None. FINDINGS: No GI bleed identified. IMPRESSION: Normal study.  No GI bleed identified. Electronically Signed   By: DaDorise BullionII M.D    On: 08/14/2020 21:00   US[place]OMPLETE JOINT SPACE STRUCTURE UP LEFT  Result Date: 08/04/2020 Formatting of this result is different from the original. US[place]ft shoulder: -Biceps tendon: Well visualized within  the bicipital groove and without any abnormalities -Pectoralis: Insertion visualized and without abnormalities. -Subscapularis: Well visualized to insertion point on humerus.  No abnormalities.  Dynamic testing over the coracoid did not show signs of impingement. -AC joint: No osteophytes, no significant separation, negative geyser sign. -Supraspinatus: Well-visualized and without any abnormalities.  Dynamic testing did not reveal signs of impingement. -Subacromial bursa: No obvious enlargement or swelling. -Infraspinatus/teres minor: Insertion point on posterior humerus visualized and without abnormalities.   Impression: -Normal ultrasound examination of the left shoulder.  DG Chest Port 1 View  Result Date: 08/12/2020 CLINICAL DATA:  GI bleed. EXAM: PORTABLE CHEST 1 VIEW COMPARISON:  03/28/2020 FINDINGS: The cardiac silhouette, mediastinal and hilar contours is stable. The lungs are clear. No pleural effusions. No pulmonary lesions. The bony thorax is intact. IMPRESSION: No acute cardiopulmonary findings. Electronically Signed   By: Marijo Sanes M.D.   On: 08/12/2020 08:58   DG Shoulder Left  Result Date: 07/28/2020 CLINICAL DATA:  Chronic left shoulder pain EXAM: LEFT SHOULDER - 2+ VIEW COMPARISON:  None. FINDINGS: There is no evidence of fracture or dislocation. Mild osteoarthritis of the glenohumeral and acromioclavicular joints. No soft tissue swelling. Median sternotomy wires are noted. IMPRESSION: Mild osteoarthritis of the left shoulder.  No acute findings. Electronically Signed   By: Davina Poke D.O.   On: 07/28/2020 10:37   CT Angio Abd/Pel w/ and/or w/o  Result Date: 08/12/2020 CLINICAL DATA:  60 year old with melena. Evaluate for lower GI bleed. EXAM: CTA ABDOMEN AND PELVIS WITHOUT  AND WITH CONTRAST TECHNIQUE: Multidetector CT imaging of the abdomen and pelvis was performed using the standard protocol during bolus administration of intravenous contrast. Multiplanar reconstructed images and MIPs were obtained and reviewed to evaluate the vascular anatomy. CONTRAST:  183m OMNIPAQUE IOHEXOL 350 MG/ML SOLN COMPARISON:  02/22/2020 FINDINGS: VASCULAR Aorta: Mild atherosclerotic disease in the in the abdominal aorta without aneurysm, dissection or stenosis. Celiac: Patent without evidence of aneurysm, dissection, vasculitis or significant stenosis. SMA: Patent without evidence of aneurysm, dissection, vasculitis or significant stenosis. Renals: Both renal arteries are patent without evidence of aneurysm, dissection, vasculitis, fibromuscular dysplasia or significant stenosis. IMA: Patent without evidence of aneurysm, dissection, vasculitis or significant stenosis. Inflow: Patent without evidence of aneurysm, dissection, vasculitis or significant stenosis. Proximal Outflow: Proximal femoral arteries are patent bilaterally. Veins: Portal venous system is patent. No gross abnormality to the IVC or renal veins. No gross abnormality to the iliac veins. Review of the MIP images confirms the above findings. NON-VASCULAR Lower chest: Prior median sternotomy.  Lung bases are clear. Hepatobiliary: Diffuse low-attenuation of the liver is suggestive for steatosis. No focal liver lesion. Multiple gallstones without gallbladder inflammation or distension. No significant biliary dilatation. Pancreas: Unremarkable. No pancreatic ductal dilatation or surrounding inflammatory changes. Spleen: Normal in size without focal abnormality. Adrenals/Urinary Tract: Adrenal glands are unremarkable. Kidneys are normal, without renal calculi, focal lesion, or hydronephrosis. Bladder is unremarkable. Stomach/Bowel: Normal appearance of the stomach. Normal appearance of small bowel with focal bowel inflammation or obstruction.  Normal appendix. Scattered colonic diverticula without acute inflammation. Again noted is large left inguinal hernia containing portion of the sigmoid colon. There is no evidence for wall thickening or inflammation associated with this large inguinal hernia. No evidence for active GI bleeding. Lymphatic: No lymphadenopathy in the abdomen or pelvis. Reproductive: Prostate is unremarkable. Other: Negative for ascites. Large left inguinal hernia containing sigmoid colon but no inflammatory changes. A tiny umbilical hernia containing fat. Fat containing right inguinal hernia. Musculoskeletal: No  acute bone abnormality. Degenerative facet disease in the lower lumbar spine. IMPRESSION: VASCULAR 1. No evidence for active GI bleeding. 2. Mild atherosclerotic disease in the abdominal aorta. Aortic Atherosclerosis (ICD10-I70.0). 3. Main visceral arteries are patent. NON-VASCULAR 1. No acute abnormalities in the abdomen or pelvis. 2. Again noted is a large left inguinal hernia containing a large portion of the sigmoid colon. No acute inflammatory changes associated with this hernia and no evidence for bowel obstruction. 3. Colonic diverticulosis. 4. Cholelithiasis. 5. Hepatic steatosis. Electronically Signed   By: Markus Daft M.D.   On: 08/12/2020 14:12

## 2020-08-18 ENCOUNTER — Encounter (HOSPITAL_COMMUNITY): Payer: Self-pay | Admitting: Gastroenterology

## 2020-08-18 LAB — COMPREHENSIVE METABOLIC PANEL
ALT: 25 U/L (ref 0–44)
AST: 22 U/L (ref 15–41)
Albumin: 3.1 g/dL — ABNORMAL LOW (ref 3.5–5.0)
Alkaline Phosphatase: 57 U/L (ref 38–126)
Anion gap: 8 (ref 5–15)
BUN: 8 mg/dL (ref 6–20)
CO2: 26 mmol/L (ref 22–32)
Calcium: 8.8 mg/dL — ABNORMAL LOW (ref 8.9–10.3)
Chloride: 102 mmol/L (ref 98–111)
Creatinine, Ser: 0.77 mg/dL (ref 0.61–1.24)
GFR, Estimated: >60 mL/min (ref >60)
Glucose, Bld: 245 mg/dL — ABNORMAL HIGH (ref 70–99)
Potassium: 4 mmol/L (ref 3.5–5.1)
Sodium: 136 mmol/L (ref 135–145)
Total Bilirubin: 0.8 mg/dL (ref 0.3–1.2)
Total Protein: 6.2 g/dL — ABNORMAL LOW (ref 6.5–8.1)

## 2020-08-18 LAB — CBC WITH DIFFERENTIAL/PLATELET
Abs Immature Granulocytes: 0.09 10*3/uL — ABNORMAL HIGH (ref 0.00–0.07)
Basophils Absolute: 0 10*3/uL (ref 0.0–0.1)
Basophils Relative: 1 %
Eosinophils Absolute: 0.2 10*3/uL (ref 0.0–0.5)
Eosinophils Relative: 3 %
HCT: 27.8 % — ABNORMAL LOW (ref 39.0–52.0)
Hemoglobin: 9.6 g/dL — ABNORMAL LOW (ref 13.0–17.0)
Immature Granulocytes: 1 %
Lymphocytes Relative: 37 %
Lymphs Abs: 3.1 10*3/uL (ref 0.7–4.0)
MCH: 29.7 pg (ref 26.0–34.0)
MCHC: 34.5 g/dL (ref 30.0–36.0)
MCV: 86.1 fL (ref 80.0–100.0)
Monocytes Absolute: 0.5 10*3/uL (ref 0.1–1.0)
Monocytes Relative: 6 %
Neutro Abs: 4.4 10*3/uL (ref 1.7–7.7)
Neutrophils Relative %: 52 %
Platelets: 160 10*3/uL (ref 150–400)
RBC: 3.23 MIL/uL — ABNORMAL LOW (ref 4.22–5.81)
RDW: 14.8 % (ref 11.5–15.5)
WBC: 8.3 10*3/uL (ref 4.0–10.5)
nRBC: 0 % (ref 0.0–0.2)

## 2020-08-18 LAB — GLUCOSE, CAPILLARY
Glucose-Capillary: 206 mg/dL — ABNORMAL HIGH (ref 70–99)
Glucose-Capillary: 284 mg/dL — ABNORMAL HIGH (ref 70–99)

## 2020-08-18 MED ORDER — PANTOPRAZOLE SODIUM 40 MG PO TBEC: DELAYED_RELEASE_TABLET | ORAL | 0 refills | Status: DC

## 2020-08-18 NOTE — Progress Notes (Signed)
Inpatient Diabetes Program Recommendations  AACE/ADA: New Consensus Statement on Inpatient Glycemic Control (2015)  Target Ranges:  Prepandial:   less than 140 mg/dL      Peak postprandial:   less than 180 mg/dL (1-2 hours)      Critically ill patients:  140 - 180 mg/dL   Lab Results  Component Value Date   GLUCAP 206 (H) 08/18/2020   HGBA1C 8.3 (H) 07/26/2020    Review of Glycemic Control Results for NITISH, KESTEL (MRN [id number]) as of 08/18/2020 08:12  Ref. Range 08/17/2020 08:06 08/17/2020 11:57 08/17/2020 16:20 08/18/2020 07:26  Glucose-Capillary Latest Ref Range: 70 - 99 mg/dL 211 (H) 164 (H) 261 (H) 206 (H)   Diabetes history: DM 2 Outpatient Diabetes medications:  Current orders for Inpatient glycemic cFarxiga 10 mg QAM, Metformin 1000 mg BID, Rybelsus 3 mg QDontrol:  Novolog 0-15 units tid + hs  Inpatient Diabetes Program Recommendations:    - Add Lantus 10 units  Thanks,  Tama Headings RN, MSN, BC-ADM Inpatient Diabetes Coordinator Team Pager [phone] (8a-5p)

## 2020-08-18 NOTE — Discharge Instructions (Signed)
Follow with Primary MD [name], MD in 7 days   Get CBC, CMP, 2 view Chest X ray checked  by Primary MD next visit.    Activity: As tolerated with Full fall precautions use walker/cane & assistance as needed   Disposition Home    Diet: Heart Healthy  , with feeding assistance and aspiration precautions.    On your next visit with your primary care physician please Get Medicines reviewed and adjusted.   Please request your Prim.MD to go over all Hospital Tests and Procedure/Radiological results at the follow up, please get all Hospital records sent to your Prim MD by signing hospital release before you go home.   If you experience worsening of your admission symptoms, develop shortness of breath, life threatening emergency, suicidal or homicidal thoughts you must seek medical attention immediately by calling 911 or calling your MD immediately  if symptoms less severe.  You Must read complete instructions/literature along with all the possible adverse reactions/side effects for all the Medicines you take and that have been prescribed to you. Take any new Medicines after you have completely understood and accpet all the possible adverse reactions/side effects.   Do not drive, operating heavy machinery, perform activities at heights, swimming or participation in water activities or provide baby sitting services if your were admitted for syncope or siezures until you have seen by Primary MD or a Neurologist and advised to do so again.  Do not drive when taking Pain medications.    Do not take more than prescribed Pain, Sleep and Anxiety Medications  Special Instructions: If you have smoked or chewed Tobacco  in the last 2 yrs please stop smoking, stop any regular Alcohol  and or any Recreational drug use.  Wear Seat belts while driving.   Please note  You were cared for by a hospitalist during your hospital stay. If you have any questions about your discharge medications or  the care you received while you were in the hospital after you are discharged, you can call the unit and asked to speak with the hospitalist on call if the hospitalist that took care of you is not available. Once you are discharged, your primary care physician will handle any further medical issues. Please note that NO REFILLS for any discharge medications will be authorized once you are discharged, as it is imperative that you return to your primary care physician (or establish a relationship with a primary care physician if you do not have one) for your aftercare needs so that they can reassess your need for medications and monitor your lab values.

## 2020-08-18 NOTE — Discharge Summary (Signed)
Physician Discharge Summary  [place]. OF:[id number] DOB: Feb 02, 1961 DOA: 08/12/2020  PCP: [name], MD  Admit date: 08/12/2020 Discharge date: 08/18/2020  Admitted From:Home Disposition:  Home   Recommendations for Outpatient Follow-up:  Follow up with PCP in 1-2 weeks Please obtain BMP/CBC in one week Please resume aspirin in 14 days if hemoglobin remains stable.   Home Health:NO Equipment/Devices:No  Discharge Condition:Stable CODE STATUS:FULL Diet recommendation: Heart Healthy / Carb Modified  Brief/Interim Summary:  Colton Guzman. is a 60 y.o. 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    Acute GI bleed, likely lower in the setting of diverticular bleed, possibly due to gastric ulcers as well  - he has received total of 6 units last transfusion evening of 08/14/2020, will try to keep his hemoglobin around 8 due to significant underlying cardiac history, continue to monitor CBC, he was on anticoagulation for A. fib which has been reversed, currently bowel rest, PPI, GI & IR on board, underwent colonoscopy which was significant for severe diverticulosis, but did not identify any active bleeding on 08/13/2020, also underwent tagged RBC scan which again was negative.  H&H seems to be stable, he is having some old black stool but no fresh bleeding.   EGD on 08/17/2020 significant for small nonbleeding gastric ulcers.  Hemoglobin remained stable, patient with bowel movements overnight normal light brown in color, no further melena, patient will have his Eliquis resumed on discharge, to continue with Protonix on discharge as well, to  resume aspirin in 2 weeks if his hemoglobin remained stable.   Acute blood loss anemia.   Hgb remains stable post transfusion   Syncope : due to above.  Stable.   CAD s/p CABG and V. tach arrest.  Currently on statin for secondary prevention, resume aspirin couple weeks if hemoglobin remains stable, back on Eliquis, continue with low-dose beta-blockers, losartan and Imdur  Paroxysmal A. fib [place] vas 2 score of greater than 2.  back on ELiquis, on metoprolol as well.   Dyslipidemia.  On statin.  Essential hypertension.  Low-dose beta-blocker once blood pressure stabilizes.   Hypokalemia - replaced.   DM type II.  Sliding scale for now.  Recent A1c was 8.3. resume home medications.    Recent Labs       Lab Results  Component Value Date    HGBA1C 8.3 (H) 07/26/2020          Obesity: follow with PCP Estimated body mass index is 26.95 kg/m as calculated from the following:   Height as of this encounter: '5\' 8"'$  (1.727 m).   Weight as of this encounter: 80.4 kg.     Discharge Diagnoses:  Principal Problem:   Lower GI bleed Active Problems:   Acute blood loss anemia   T2DM (type 2 diabetes mellitus) ([place])   Hypertension   Dyslipidemia, goal LDL below 70   PAF (paroxysmal atrial fibrillation) (H[facility])   Syncope, vasovagal   Leukocytosis   Sinus tachycardia   GI bleed   Acute gastritis    Discharge Instructions  Discharge Instructions     Diet - low sodium heart healthy   Complete by:  As directed    Increase activity slowly   Complete by: As directed    Increase activity slowly   Complete by: As directed       Allergies as of 08/18/2020   No Known Allergies      Medication List     STOP taking these medications    amLODipine 10 MG tablet Commonly known as: NORVASC   aspirin EC 81 MG tablet   vitamin C 1000 MG tablet       TAKE these medications    acetaminophen 500 MG tablet Commonly known as: TYLENOL Take 1,000 mg by mouth every 4 (four)  hours as needed (pain).   allopurinol 100 MG tablet Commonly known as: ZYLOPRIM TAKE 1 TABLET BY MOUTH EVERY DAY   apixaban 5 MG Tabs tablet Commonly known as: ELIQUIS Take 1 tablet (5 mg total) by mouth 2 (two) times daily.   atorvastatin 80 MG tablet Commonly known as: LIPITOR Take 1 tablet (80 mg total) by mouth daily.   dapagliflozin propanediol 10 MG Tabs tablet Commonly known as: Farxiga Take 1 tablet (10 mg total) by mouth daily before breakfast.   Gvoke HypoPen 2-Pack 1 MG/0.2ML Soaj Generic drug: Glucagon Inject 1 Act into the skin daily as needed.   isosorbide mononitrate 30 MG 24 hr tablet Commonly known as: IMDUR Take 1 tablet (30 mg total) by mouth daily.   losartan 50 MG tablet Commonly known as: COZAAR Take 1 tablet (50 mg total) by mouth daily.   Magnesium 300 MG Caps Take 300 mg by mouth daily.   metFORMIN 1000 MG tablet Commonly known as: GLUCOPHAGE Take 1 tablet (1,000 mg total) by mouth 2 (two) times daily with a meal.   metoprolol succinate 25 MG 24 hr tablet Commonly known as: Toprol XL Take 1 tablet (25 mg total) by mouth daily.   multivitamin with minerals Tabs tablet Take 1 tablet by mouth daily.   pantoprazole 40 MG tablet Commonly known as: Protonix Please take 40 mg oral twice daily x30 days, then 40 mg oral daily.   Rybelsus 3 MG Tabs Generic drug: Semaglutide Take 3 mg by mouth daily.   traMADol 50 MG tablet Commonly known as: Ultram Take 1 tablet (50 mg total) by mouth every 6 (six) hours as needed.   Vitamin D (Ergocalciferol) 1.25 MG (50000 UNIT) Caps capsule Commonly known as: DRISDOL Take 50,000 Units by mouth every 7 (seven) days. Tuesdays        Follow-up Information     Freada Bergeron, MD Follow up on 08/31/2020.   Specialties: Cardiology, Radiology Why: at 9:20 AM Contact information: [id number] [place] [address] [phone]         Vickie Epley, MD Follow up.    Specialties: Cardiology, Radiology Why: the office will call with date and time this is the heart rhythm doctor. Contact information: [address] [place] [address] [phone]         [name], MD Follow up in 1 week(s).   Specialty: Internal Medicine Contact information: [place] [address] [phone]                No Known Allergies  Consultations: GI cardiology   Procedures/Studies: NM GI Blood Loss  Result Date: 08/14/2020 CLINICAL DATA:  Lower GI bleed. EXAM: NUCLEAR MEDICINE GASTROINTESTINAL BLEEDING SCAN TECHNIQUE: Sequential abdominal images were obtained following intravenous administration of Tc-52mlabeled red blood cells. RADIOPHARMACEUTICALS:  21.6 mCi Tc-86mpertechnetate in-vitro labeled red cells. COMPARISON:  None. FINDINGS: No GI bleed identified. IMPRESSION: Normal study.  No GI bleed identified. Electronically Signed   By: DDorise BullionIII M.D   On: 08/14/2020 21:00   UKoreaCOMPLETE JOINT SPACE STRUCTURE UP LEFT  Result Date: 08/04/2020 Formatting of this result is different from the original. UKorealeft shoulder: -Biceps tendon: Well visualized within the bicipital groove and without any abnormalities -Pectoralis: Insertion visualized and without abnormalities. -Subscapularis: Well visualized to insertion point on humerus.  No abnormalities.  Dynamic testing over the coracoid did not show signs of impingement. -AC joint: No osteophytes, no significant separation, negative geyser sign. -Supraspinatus: Well-visualized and without any abnormalities.  Dynamic testing did not reveal signs of impingement. -Subacromial bursa: No obvious enlargement or swelling. -Infraspinatus/teres minor: Insertion point on posterior humerus visualized and without abnormalities.   Impression: -Normal ultrasound examination of the left shoulder.  DG Chest Port 1 View  Result Date: 08/12/2020 CLINICAL DATA:  GI bleed. EXAM:  PORTABLE CHEST 1 VIEW COMPARISON:  03/28/2020 FINDINGS: The cardiac silhouette, mediastinal and hilar contours is stable. The lungs are clear. No pleural effusions. No pulmonary lesions. The bony thorax is intact. IMPRESSION: No acute cardiopulmonary findings. Electronically Signed   By: PMarijo SanesM.D.   On: 08/12/2020 08:58   DG Shoulder Left  Result Date: 07/28/2020 CLINICAL DATA:  Chronic left shoulder pain EXAM: LEFT SHOULDER - 2+ VIEW COMPARISON:  None. FINDINGS: There is no evidence of fracture or dislocation. Mild osteoarthritis of the glenohumeral and acromioclavicular joints. No soft tissue swelling. Median sternotomy wires are noted. IMPRESSION: Mild osteoarthritis of the left shoulder.  No acute findings. Electronically Signed   By: NDavina PokeD.O.   On: 07/28/2020 10:37   CT Angio Abd/Pel w/ and/or w/o  Result Date: 08/12/2020 CLINICAL DATA:  60year old with melena. Evaluate for lower GI bleed. EXAM: CTA ABDOMEN AND PELVIS WITHOUT AND WITH CONTRAST TECHNIQUE: Multidetector CT imaging of the abdomen and pelvis was performed using the standard protocol during bolus administration of intravenous contrast. Multiplanar reconstructed images and MIPs were obtained and reviewed to evaluate the vascular anatomy. CONTRAST:  1030mOMNIPAQUE IOHEXOL 350 MG/ML SOLN COMPARISON:  02/22/2020 FINDINGS: VASCULAR Aorta: Mild atherosclerotic disease in the in the abdominal aorta without aneurysm, dissection or stenosis. Celiac: Patent without evidence of aneurysm, dissection, vasculitis or significant stenosis. SMA: Patent without evidence of aneurysm, dissection, vasculitis or significant stenosis. Renals: Both renal arteries are patent without evidence of aneurysm, dissection, vasculitis, fibromuscular dysplasia or significant stenosis. IMA: Patent without evidence of aneurysm, dissection, vasculitis or significant stenosis. Inflow: Patent without evidence of aneurysm, dissection, vasculitis or  significant stenosis. Proximal Outflow: Proximal femoral arteries are patent bilaterally. Veins: Portal venous system is patent. No gross abnormality to the IVC or renal veins. No gross abnormality to the iliac veins. Review of the MIP images confirms the above findings. NON-VASCULAR Lower chest: Prior median sternotomy.  Lung bases are clear. Hepatobiliary: Diffuse low-attenuation of the liver is suggestive for steatosis. No focal liver lesion. Multiple gallstones without gallbladder inflammation or distension. No significant biliary dilatation. Pancreas: Unremarkable. No pancreatic ductal dilatation or surrounding inflammatory changes. Spleen: Normal in size without focal abnormality. Adrenals/Urinary Tract: Adrenal glands are unremarkable. Kidneys are normal, without renal calculi, focal lesion, or hydronephrosis. Bladder is unremarkable. Stomach/Bowel: Normal appearance of the stomach. Normal appearance of small bowel with focal bowel inflammation or obstruction. Normal appendix. Scattered colonic diverticula without acute inflammation. Again noted is large left  inguinal hernia containing portion of the sigmoid colon. There is no evidence for wall thickening or inflammation associated with this large inguinal hernia. No evidence for active GI bleeding. Lymphatic: No lymphadenopathy in the abdomen or pelvis. Reproductive: Prostate is unremarkable. Other: Negative for ascites. Large left inguinal hernia containing sigmoid colon but no inflammatory changes. A tiny umbilical hernia containing fat. Fat containing right inguinal hernia. Musculoskeletal: No acute bone abnormality. Degenerative facet disease in the lower lumbar spine. IMPRESSION: VASCULAR 1. No evidence for active GI bleeding. 2. Mild atherosclerotic disease in the abdominal aorta. Aortic Atherosclerosis (ICD10-I70.0). 3. Main visceral arteries are patent. NON-VASCULAR 1. No acute abnormalities in the abdomen or pelvis. 2. Again noted is a large left  inguinal hernia containing a large portion of the sigmoid colon. No acute inflammatory changes associated with this hernia and no evidence for bowel obstruction. 3. Colonic diverticulosis. 4. Cholelithiasis. 5. Hepatic steatosis. Electronically Signed   By: Markus Daft M.D.   On: 08/12/2020 14:12     Subjective: Denies any dyspnea or chest pain, had bowel movements overnight, light brown in color, no melena or bright red blood per rectum  Discharge Exam: Vitals:   08/18/20 0440 08/18/20 0726  BP: (!) 96/42 135/86  Pulse: 69 79  Resp: 17 18  Temp: 98.2 F (36.8 C) 98.3 F (36.8 C)  SpO2: 96%    Vitals:   08/17/20 2028 08/17/20 2339 08/18/20 0440 08/18/20 0726  BP: 122/79 118/60 (!) 96/42 135/86  Pulse: 79 69 69 79  Resp: '15 16 17 18  '$ Temp: 98.5 F (36.9 C) 98.3 F (36.8 C) 98.2 F (36.8 C) 98.3 F (36.8 C)  TempSrc: Axillary Axillary Axillary Oral  SpO2: 99% 98% 96%   Weight:      Height:        General: Pt is alert, awake, not in acute distress Cardiovascular: RRR, S1/S2 +, no rubs, no gallops Respiratory: CTA bilaterally, no wheezing, no rhonchi Abdominal: Soft, NT, ND, bowel sounds + Extremities: no edema, no cyanosis    The results of significant diagnostics from this hospitalization (including imaging, microbiology, ancillary and laboratory) are listed below for reference.     Microbiology: Recent Results (from the past 240 hour(s))  Resp Panel by RT-PCR (Flu A&B, Covid) Nasopharyngeal Swab     Status: None   Collection Time: 08/12/20  8:01 AM   Specimen: Nasopharyngeal Swab; Nasopharyngeal(NP) swabs in vial transport medium  Result Value Ref Range Status   SARS Coronavirus 2 by RT PCR NEGATIVE NEGATIVE Final    Comment: (NOTE) SARS-CoV-2 target nucleic acids are NOT DETECTED.  The SARS-CoV-2 RNA is generally detectable in upper respiratory specimens during the acute phase of infection. The lowest concentration of SARS-CoV-2 viral copies this assay can  detect is 138 copies/mL. A negative result does not preclude SARS-Cov-2 infection and should not be used as the sole basis for treatment or other patient management decisions. A negative result may occur with  improper specimen collection/handling, submission of specimen other than nasopharyngeal swab, presence of viral mutation(s) within the areas targeted by this assay, and inadequate number of viral copies(<138 copies/mL). A negative result must be combined with clinical observations, patient history, and epidemiological information. The expected result is Negative.  Fact Sheet for Patients:  EntrepreneurPulse.com.au  Fact Sheet for Healthcare Providers:  IncredibleEmployment.be  This test is no t yet approved or cleared by the [place] FDA and  has been authorized for detection and/or diagnosis of SARS-CoV-2 by FDA under an  Emergency Use Authorization (EUA). This EUA will remain  in effect (meaning this test can be used) for the duration of the COVID-19 declaration under Section 564(b)(1) of the Act, 21 U.S.C.section 360bbb-3(b)(1), unless the authorization is terminated  or revoked sooner.       Influenza A by PCR NEGATIVE NEGATIVE Final   Influenza B by PCR NEGATIVE NEGATIVE Final    Comment: (NOTE) The Xpert Xpress SARS-CoV-2/FLU/RSV plus assay is intended as an aid in the diagnosis of influenza from Nasopharyngeal swab specimens and should not be used as a sole basis for treatment. Nasal washings and aspirates are unacceptable for Xpert Xpress SARS-CoV-2/FLU/RSV testing.  Fact Sheet for Patients: EntrepreneurPulse.com.au  Fact Sheet for Healthcare Providers: IncredibleEmployment.be  This test is not yet approved or cleared by the [place] FDA and has been authorized for detection and/or diagnosis of SARS-CoV-2 by FDA under an Emergency Use Authorization (EUA). This EUA will remain in  effect (meaning this test can be used) for the duration of the COVID-19 declaration under Section 564(b)(1) of the Act, 21 U.S.C. section 360bbb-3(b)(1), unless the authorization is terminated or revoked.  Performed at [facility], [place] [address] [place], [place] [address]      Labs: BNP (last 3 results) Recent Labs    08/15/20 0459 08/16/20 0115 08/17/20 0046  BNP 65.8 120.6* [id number]*   Basic Metabolic Panel: Recent Labs  Lab 08/14/20 0313 08/14/20 1438 08/15/20 0459 08/16/20 0115 08/17/20 0046 08/18/20 0124  NA  --  136 138 136 137 136  K  --  3.3* 3.1* 3.3* 3.6 4.0  CL  --  102 102 104 105 102  CO2  --  '25 28 26 26 26  '$ GLUCOSE  --  317* 207* 211* 322* 245*  BUN  --  '6 6 6 8 8  '$ CREATININE  --  0.78 0.68 0.80 0.90 0.77  CALCIUM  --  8.2* 8.2* 8.6* 8.8* 8.8*  MG 1.8  --  1.8 1.8 1.9  --    Liver Function Tests: Recent Labs  Lab 08/12/20 0727 08/15/20 0459 08/16/20 0115 08/17/20 0046 08/18/20 0124  AST '22 20 21 22 22  '$ ALT '27 19 21 24 25  '$ ALKPHOS 70 37* 46 54 57  BILITOT 1.0 0.6 0.5 0.5 0.8  PROT 7.9 5.5* 5.7* 5.8* 6.2*  ALBUMIN 4.1 2.8* 2.8* 2.9* 3.1*   No results for input(s): LIPASE, AMYLASE in the last 168 hours. No results for input(s): AMMONIA in the last 168 hours. CBC: Recent Labs  Lab 08/15/20 0856 08/16/20 0115 08/16/20 1103 08/17/20 0046 08/18/20 0124  WBC 6.7 6.4 6.7 6.3 8.3  NEUTROABS  --  3.3  --  3.1 4.4  HGB 10.3* 9.5* 10.1* 9.0* 9.6*  HCT 29.7* 27.5* 30.4* 27.0* 27.8*  MCV 84.9 85.7 86.4 86.8 86.1  PLT 112* 113* 147* 150 160   Cardiac Enzymes: No results for input(s): CKTOTAL, CKMB, CKMBINDEX, TROPONINI in the last 168 hours. BNP: Invalid input(s): POCBNP CBG: Recent Labs  Lab 08/17/20 0806 08/17/20 1157 08/17/20 1620 08/18/20 0726 08/18/20 1141  GLUCAP 211* 164* 261* 206* 284*   D-Dimer No results for input(s): DDIMER in the last 72 hours. Hgb A1c No results for input(s): HGBA1C in the last 72 hours. Lipid  Profile No results for input(s): CHOL, HDL, LDLCALC, TRIG, CHOLHDL, LDLDIRECT in the last 72 hours. Thyroid function studies No results for input(s): TSH, T4TOTAL, T3FREE, THYROIDAB in the last 72 hours.  Invalid input(s): FREET3 Anemia work up  No results for input(s): VITAMINB12, FOLATE, FERRITIN, TIBC, IRON, RETICCTPCT in the last 72 hours. Urinalysis    Component Value Date/Time   COLORURINE YELLOW 03/08/2020 0531   APPEARANCEUR HAZY (A) 03/08/2020 0531   LABSPEC 1.017 03/08/2020 0531   PHURINE 6.0 03/08/2020 0531   GLUCOSEU NEGATIVE 03/08/2020 0531   HGBUR NEGATIVE 03/08/2020 0531   BILIRUBINUR NEGATIVE 03/08/2020 0531   KETONESUR NEGATIVE 03/08/2020 0531   PROTEINUR NEGATIVE 03/08/2020 0531   NITRITE NEGATIVE 03/08/2020 0531   LEUKOCYTESUR NEGATIVE 03/08/2020 0531   Sepsis Labs Invalid input(s): PROCALCITONIN,  WBC,  LACTICIDVEN Microbiology Recent Results (from the past 240 hour(s))  Resp Panel by RT-PCR (Flu A&B, Covid) Nasopharyngeal Swab     Status: None   Collection Time: 08/12/20  8:01 AM   Specimen: Nasopharyngeal Swab; Nasopharyngeal(NP) swabs in vial transport medium  Result Value Ref Range Status   SARS Coronavirus 2 by RT PCR NEGATIVE NEGATIVE Final    Comment: (NOTE) SARS-CoV-2 target nucleic acids are NOT DETECTED.  The SARS-CoV-2 RNA is generally detectable in upper respiratory specimens during the acute phase of infection. The lowest concentration of SARS-CoV-2 viral copies this assay can detect is 138 copies/mL. A negative result does not preclude SARS-Cov-2 infection and should not be used as the sole basis for treatment or other patient management decisions. A negative result may occur with  improper specimen collection/handling, submission of specimen other than nasopharyngeal swab, presence of viral mutation(s) within the areas targeted by this assay, and inadequate number of viral copies(<138 copies/mL). A negative result must be combined  with clinical observations, patient history, and epidemiological information. The expected result is Negative.  Fact Sheet for Patients:  EntrepreneurPulse.com.au  Fact Sheet for Healthcare Providers:  IncredibleEmployment.be  This test is no t yet approved or cleared by the [place] FDA and  has been authorized for detection and/or diagnosis of SARS-CoV-2 by FDA under an Emergency Use Authorization (EUA). This EUA will remain  in effect (meaning this test can be used) for the duration of the COVID-19 declaration under Section 564(b)(1) of the Act, 21 U.S.C.section 360bbb-3(b)(1), unless the authorization is terminated  or revoked sooner.       Influenza A by PCR NEGATIVE NEGATIVE Final   Influenza B by PCR NEGATIVE NEGATIVE Final    Comment: (NOTE) The Xpert Xpress SARS-CoV-2/FLU/RSV plus assay is intended as an aid in the diagnosis of influenza from Nasopharyngeal swab specimens and should not be used as a sole basis for treatment. Nasal washings and aspirates are unacceptable for Xpert Xpress SARS-CoV-2/FLU/RSV testing.  Fact Sheet for Patients: EntrepreneurPulse.com.au  Fact Sheet for Healthcare Providers: IncredibleEmployment.be  This test is not yet approved or cleared by the [place] FDA and has been authorized for detection and/or diagnosis of SARS-CoV-2 by FDA under an Emergency Use Authorization (EUA). This EUA will remain in effect (meaning this test can be used) for the duration of the COVID-19 declaration under Section 564(b)(1) of the Act, 21 U.S.C. section 360bbb-3(b)(1), unless the authorization is terminated or revoked.  Performed at [facility], [place] [address] [place], [place] [address]      Time coordinating discharge: Over 30 minutes  SIGNED:   Phillips Climes, MD  [facility] 08/18/2020, 11:43 AM Pager   If 7PM-7AM, please contact  night-coverage [email or web address] Password [email or web address]

## 2020-08-19 ENCOUNTER — Ambulatory Visit: Payer: Managed Care, Other (non HMO)

## 2020-08-19 DIAGNOSIS — Z9289 Personal history of other medical treatment: Secondary | ICD-10-CM

## 2020-08-19 HISTORY — DX: Personal history of other medical treatment: Z92.89

## 2020-08-19 LAB — SURGICAL PATHOLOGY

## 2020-08-19 LAB — GLUCOSE, CAPILLARY: Glucose-Capillary: 306 mg/dL — ABNORMAL HIGH (ref 70–99)

## 2020-08-25 ENCOUNTER — Encounter: Payer: Self-pay | Admitting: Internal Medicine

## 2020-08-25 ENCOUNTER — Ambulatory Visit (INDEPENDENT_AMBULATORY_CARE_PROVIDER_SITE_OTHER): Payer: Managed Care, Other (non HMO) | Admitting: Internal Medicine

## 2020-08-25 ENCOUNTER — Other Ambulatory Visit: Payer: Self-pay

## 2020-08-25 VITALS — BP 122/76 | HR 97 | Temp 98.8°F | Resp 16 | Ht 68.0 in | Wt 183.0 lb

## 2020-08-25 DIAGNOSIS — D500 Iron deficiency anemia secondary to blood loss (chronic): Secondary | ICD-10-CM

## 2020-08-25 DIAGNOSIS — D510 Vitamin B12 deficiency anemia due to intrinsic factor deficiency: Secondary | ICD-10-CM | POA: Diagnosis not present

## 2020-08-25 DIAGNOSIS — E1159 Type 2 diabetes mellitus with other circulatory complications: Secondary | ICD-10-CM | POA: Diagnosis not present

## 2020-08-25 DIAGNOSIS — D539 Nutritional anemia, unspecified: Secondary | ICD-10-CM

## 2020-08-25 MED ORDER — CYANOCOBALAMIN 1000 MCG/ML IJ SOLN
1000.0000 ug | Freq: Once | INTRAMUSCULAR | Status: AC
  Administered 2020-08-25: 1000 ug via INTRAMUSCULAR

## 2020-08-25 MED ORDER — RYBELSUS 7 MG PO TABS: 7.0000 mg | ORAL_TABLET | Freq: Every day | ORAL | 0 refills | Status: DC

## 2020-08-25 NOTE — Progress Notes (Signed)
I   Subjective:  Patient ID: Colton Guzman., male    DOB: 1960-04-18  Age: 60 y.o. MRN: [id number]  CC: Anemia  This visit occurred during the SARS-CoV-2 public health emergency.  Safety protocols were in place, including screening questions prior to the visit, additional usage of staff PPE, and extensive cleaning of exam room while observing appropriate contact time as indicated for disinfecting solutions.    HPI [facility]. presents for f/up -   He was recently admitted for another GI bleed - upper and lower components.  He was also found to be anemic.  He has not had his B12 supplement recently.  He denies abdominal pain, nausea, vomiting, melena, bright red blood per rectum, loss of appetite, weight loss, shortness of breath, or edema.   Admitted From:Home Disposition:  Home   Recommendations for Outpatient Follow-up:  Follow up with PCP in 1-2 weeks Please obtain BMP/CBC in one week Please resume aspirin in 14 days if hemoglobin remains stable.     Home Health:NO Equipment/Devices:No   Discharge Condition:Stable CODE STATUS:FULL Diet recommendation: Heart Healthy / Carb Modified   Brief/Interim Summary:   Colton Guzman. is a 60 y.o. 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       Acute GI bleed, likely lower in the setting of diverticular bleed, possibly due to gastric ulcers as well  - he has received total of 6 units last transfusion evening of 08/14/2020, will try to keep his hemoglobin around 8 due to significant underlying cardiac history, continue to monitor CBC, he was on anticoagulation for A. fib which  has been reversed, currently bowel rest, PPI, GI & IR on board, underwent colonoscopy which was significant for severe diverticulosis, but did not identify any active bleeding on 08/13/2020, also underwent tagged RBC scan which again was negative.  H&H seems to be stable, he is having some old black stool but no fresh bleeding.   EGD on 08/17/2020 significant for small nonbleeding gastric ulcers.  Hemoglobin remained stable, patient with bowel movements overnight normal light brown in color, no further melena, patient will have his Eliquis resumed on discharge, to continue with Protonix on discharge as well, to resume aspirin in 2 weeks if his hemoglobin remained stable.   Acute blood loss anemia.   Hgb remains stable post transfusion   Syncope : due to above.  Stable.   CAD s/p CABG and V. tach arrest.  Currently on statin for secondary prevention, resume aspirin couple weeks if hemoglobin remains stable, back on Eliquis, continue with low-dose beta-blockers, losartan and Imdur   Paroxysmal A. fib [place] vas 2 score of greater than 2.  back on ELiquis, on metoprolol as well.   Dyslipidemia.  On statin.   Essential hypertension.  Low-dose beta-blocker once blood pressure stabilizes.   Hypokalemia - replaced.   DM type II.  Sliding scale for now.  Recent A1c was 8.3. resume home medications.     Recent Labs           Lab Results  Component Value Date    HGBA1C 8.3 (H) 07/26/2020  Obesity: follow with PCP Estimated body mass index is 26.95 kg/m as calculated from the following:   Height as of this encounter: '5\' 8"'$  (1.727 m).   Weight as of this encounter: 80.4 kg.      Discharge Diagnoses:  Principal Problem:   Lower GI bleed Active Problems:   Acute blood loss anemia   T2DM (type 2 diabetes mellitus) ([place])   Hypertension   Dyslipidemia, goal LDL below 70   PAF (paroxysmal atrial fibrillation) ([facility])   Syncope, vasovagal   Leukocytosis   Sinus tachycardia   GI bleed    Acute gastritis       Discharge Instructions   Discharge Instructions       Diet - low sodium heart healthy   Complete by: As directed      Increase activity slowly   Complete by: As directed      Increase activity slowly   Complete by: As directed           Allergies as of 08/18/2020   No Known Allergies         Medication List       STOP taking these medications     amLODipine 10 MG tablet Commonly known as: NORVASC    aspirin EC 81 MG tablet    vitamin C 1000 MG tablet           TAKE these medications     acetaminophen 500 MG tablet Commonly known as: TYLENOL Take 1,000 mg by mouth every 4 (four) hours as needed (pain).    allopurinol 100 MG tablet Commonly known as: ZYLOPRIM TAKE 1 TABLET BY MOUTH EVERY DAY    apixaban 5 MG Tabs tablet Commonly known as: ELIQUIS Take 1 tablet (5 mg total) by mouth 2 (two) times daily.    atorvastatin 80 MG tablet Commonly known as: LIPITOR Take 1 tablet (80 mg total) by mouth daily.    dapagliflozin propanediol 10 MG Tabs tablet Commonly known as: Farxiga Take 1 tablet (10 mg total) by mouth daily before breakfast.    Gvoke HypoPen 2-Pack 1 MG/0.2ML Soaj Generic drug: Glucagon Inject 1 Act into the skin daily as needed.    isosorbide mononitrate 30 MG 24 hr tablet Commonly known as: IMDUR Take 1 tablet (30 mg total) by mouth daily.    losartan 50 MG tablet Commonly known as: COZAAR Take 1 tablet (50 mg total) by mouth daily.    Magnesium 300 MG Caps Take 300 mg by mouth daily.    metFORMIN 1000 MG tablet Commonly known as: GLUCOPHAGE Take 1 tablet (1,000 mg total) by mouth 2 (two) times daily with a meal.    metoprolol succinate 25 MG 24 hr tablet Commonly known as: Toprol XL Take 1 tablet (25 mg total) by mouth daily.    multivitamin with minerals Tabs tablet Take 1 tablet by mouth daily.    pantoprazole 40 MG tablet Commonly known as: Protonix Please take 40 mg oral twice daily x30 days, then  40 mg oral daily.    Rybelsus 3 MG Tabs Generic drug: Semaglutide Take 3 mg by mouth daily.    traMADol 50 MG tablet Commonly known as: Ultram Take 1 tablet (50 mg total) by mouth every 6 (six) hours as needed.    Vitamin D (Ergocalciferol) 1.25 MG (50000 UNIT) Caps capsule Commonly known as: DRISDOL Take 50,000 Units by mouth every 7 (seven) days. Tuesdays             Follow-up Information  Freada Bergeron, MD Follow up on 08/31/2020.   Specialties: Cardiology, Radiology Why: at 9:20 AM Contact information: [id number] [address] [place] [address] [phone]              Vickie Epley, MD Follow up.   Specialties: Cardiology, Radiology Why: the office will call with date and time this is the heart rhythm doctor. Contact information: [address] [place] [address] [phone]              [name], MD Follow up in 1 week(s).   Specialty: Internal Medicine Contact information: [place] [address] [phone]                        No Known Allergies   Consultations: GI cardiology     Procedures/Studies: NM GI Blood Loss   Result Date: 08/14/2020 CLINICAL DATA:  Lower GI bleed. EXAM: NUCLEAR MEDICINE GASTROINTESTINAL BLEEDING SCAN TECHNIQUE: Sequential abdominal images were obtained following intravenous administration of Tc-49mlabeled red blood cells. RADIOPHARMACEUTICALS:  21.6 mCi Tc-933mertechnetate in-vitro labeled red cells. COMPARISON:  None. FINDINGS: No GI bleed identified. IMPRESSION: Normal study.  No GI bleed identified. Electronically Signed   By: DaDorise BullionII M.D   On: 08/14/2020 21:00   US[place]OMPLETE JOINT SPACE STRUCTURE UP LEFT   Result Date: 08/04/2020 Formatting of this result is different from the original. [place] shoulder: -Biceps tendon: Well visualized within the bicipital groove and without any abnormalities -Pectoralis: Insertion visualized  and without abnormalities. -Subscapularis: Well visualized to insertion point on humerus.  No abnormalities.  Dynamic testing over the coracoid did not show signs of impingement. -AC joint: No osteophytes, no significant separation, negative geyser sign. -Supraspinatus: Well-visualized and without any abnormalities.  Dynamic testing did not reveal signs of impingement. -Subacromial bursa: No obvious enlargement or swelling. -Infraspinatus/teres minor: Insertion point on posterior humerus visualized and without abnormalities.   Impression: -Normal ultrasound examination of the left shoulder.   DG Chest Port 1 View   Result Date: 08/12/2020 CLINICAL DATA:  GI bleed. EXAM: PORTABLE CHEST 1 VIEW COMPARISON:  03/28/2020 FINDINGS: The cardiac silhouette, mediastinal and hilar contours is stable. The lungs are clear. No pleural effusions. No pulmonary lesions. The bony thorax is intact. IMPRESSION: No acute cardiopulmonary findings. Electronically Signed   By: P.Marijo Sanes.D.   On: 08/12/2020 08:58   DG Shoulder Left   Result Date: 07/28/2020 CLINICAL DATA:  Chronic left shoulder pain EXAM: LEFT SHOULDER - 2+ VIEW COMPARISON:  None. FINDINGS: There is no evidence of fracture or dislocation. Mild osteoarthritis of the glenohumeral and acromioclavicular joints. No soft tissue swelling. Median sternotomy wires are noted. IMPRESSION: Mild osteoarthritis of the left shoulder.  No acute findings. Electronically Signed   By: NiDavina Poke.O.   On: 07/28/2020 10:37   CT Angio Abd/Pel w/ and/or w/o   Result Date: 08/12/2020 CLINICAL DATA:  [age]ear old with melena. Evaluate for lower GI bleed. EXAM: CTA ABDOMEN AND PELVIS WITHOUT AND WITH CONTRAST TECHNIQUE: Multidetector CT imaging of the abdomen and pelvis was performed using the standard protocol during bolus administration of intravenous contrast. Multiplanar reconstructed images and MIPs were obtained and reviewed to evaluate the vascular anatomy. CONTRAST:   10045mMNIPAQUE IOHEXOL 350 MG/ML SOLN COMPARISON:  02/22/2020 FINDINGS: VASCULAR Aorta: Mild atherosclerotic disease in the in the abdominal aorta without aneurysm, dissection or stenosis. Celiac: Patent without evidence of aneurysm,  dissection, vasculitis or significant stenosis. SMA: Patent without evidence of aneurysm, dissection, vasculitis or significant stenosis. Renals: Both renal arteries are patent without evidence of aneurysm, dissection, vasculitis, fibromuscular dysplasia or significant stenosis. IMA: Patent without evidence of aneurysm, dissection, vasculitis or significant stenosis. Inflow: Patent without evidence of aneurysm, dissection, vasculitis or significant stenosis. Proximal Outflow: Proximal femoral arteries are patent bilaterally. Veins: Portal venous system is patent. No gross abnormality to the IVC or renal veins. No gross abnormality to the iliac veins. Review of the MIP images confirms the above findings. NON-VASCULAR Lower chest: Prior median sternotomy.  Lung bases are clear. Hepatobiliary: Diffuse low-attenuation of the liver is suggestive for steatosis. No focal liver lesion. Multiple gallstones without gallbladder inflammation or distension. No significant biliary dilatation. Pancreas: Unremarkable. No pancreatic ductal dilatation or surrounding inflammatory changes. Spleen: Normal in size without focal abnormality. Adrenals/Urinary Tract: Adrenal glands are unremarkable. Kidneys are normal, without renal calculi, focal lesion, or hydronephrosis. Bladder is unremarkable. Stomach/Bowel: Normal appearance of the stomach. Normal appearance of small bowel with focal bowel inflammation or obstruction. Normal appendix. Scattered colonic diverticula without acute inflammation. Again noted is large left inguinal hernia containing portion of the sigmoid colon. There is no evidence for wall thickening or inflammation associated with this large inguinal hernia. No evidence for active GI  bleeding. Lymphatic: No lymphadenopathy in the abdomen or pelvis. Reproductive: Prostate is unremarkable. Other: Negative for ascites. Large left inguinal hernia containing sigmoid colon but no inflammatory changes. A tiny umbilical hernia containing fat. Fat containing right inguinal hernia. Musculoskeletal: No acute bone abnormality. Degenerative facet disease in the lower lumbar spine. IMPRESSION: VASCULAR 1. No evidence for active GI bleeding. 2. Mild atherosclerotic disease in the abdominal aorta. Aortic Atherosclerosis (ICD10-I70.0). 3. Main visceral arteries are patent. NON-VASCULAR 1. No acute abnormalities in the abdomen or pelvis. 2. Again noted is a large left inguinal hernia containing a large portion of the sigmoid colon. No acute inflammatory changes associated with this hernia and no evidence for bowel obstruction. 3. Colonic diverticulosis. 4. Cholelithiasis. 5. Hepatic steatosis. Electronically Signed   By: Markus Daft M.D.   On: 08/12/2020 14:12   Imaging Results        Subjective: Denies any dyspnea or chest pain, had bowel movements overnight, light brown in color, no melena or bright red blood per rectum   Discharge Exam:     Vitals:    08/18/20 0440 08/18/20 0726  BP: (!) 96/42 135/86  Pulse: 69 79  Resp: 17 18  Temp: 98.2 F (36.8 C) 98.3 F (36.8 C)  SpO2: 96%            Vitals:    08/17/20 2028 08/17/20 2339 08/18/20 0440 08/18/20 0726  BP: 122/79 118/60 (!) 96/42 135/86  Pulse: 79 69 69 79  Resp: '15 16 17 18  '$ Temp: 98.5 F (36.9 C) 98.3 F (36.8 C) 98.2 F (36.8 C) 98.3 F (36.8 C)  TempSrc: Axillary Axillary Axillary Oral  SpO2: 99% 98% 96%    Weight:          Height:              General: Pt is alert, awake, not in acute distress Cardiovascular: RRR, S1/S2 +, no rubs, no gallops Respiratory: CTA bilaterally, no wheezing, no rhonchi Abdominal: Soft, NT, ND, bowel sounds + Extremities: no edema, no cyanosis       The results of significant  diagnostics from this hospitalization (including imaging, microbiology, ancillary and laboratory) are listed below  for reference.      Microbiology:        Recent Results (from the past 240 hour(s))  Resp Panel by RT-PCR (Flu A&B, Covid) Nasopharyngeal Swab     Status: None    Collection Time: 08/12/20  8:01 AM    Specimen: Nasopharyngeal Swab; Nasopharyngeal(NP) swabs in vial transport medium  Result Value Ref Range Status    SARS Coronavirus 2 by RT PCR NEGATIVE NEGATIVE Final      Comment: (NOTE) SARS-CoV-2 target nucleic acids are NOT DETECTED.   The SARS-CoV-2 RNA is generally detectable in upper respiratory specimens during the acute phase of infection. The lowest concentration of SARS-CoV-2 viral copies this assay can detect is 138 copies/mL. A negative result does not preclude SARS-Cov-2 infection and should not be used as the sole basis for treatment or other patient management decisions. A negative result may occur with improper specimen collection/handling, submission of specimen other than nasopharyngeal swab, presence of viral mutation(s) within the areas targeted by this assay, and inadequate number of viral copies(<138 copies/mL). A negative result must be combined with clinical observations, patient history, and epidemiological information. The expected result is Negative.   Fact Sheet for Patients: EntrepreneurPulse.com.au   Fact Sheet for Healthcare Providers: IncredibleEmployment.be   This test is no t yet approved or cleared by the [place] FDA and has been authorized for detection and/or diagnosis of SARS-CoV-2 by FDA under an Emergency Use Authorization (EUA). This EUA will remain in effect (meaning this test can be used) for the duration of the COVID-19 declaration under Section 564(b)(1) of the Act, 21 U.S.C.section 360bbb-3(b)(1), unless the authorization is terminated or revoked sooner.          Influenza A  by PCR NEGATIVE NEGATIVE Final    Influenza B by PCR NEGATIVE NEGATIVE Final      Comment: (NOTE) The Xpert Xpress SARS-CoV-2/FLU/RSV plus assay is intended as an aid in the diagnosis of influenza from Nasopharyngeal swab specimens and should not be used as a sole basis for treatment. Nasal washings and aspirates are unacceptable for Xpert Xpress SARS-CoV-2/FLU/RSV testing.   Fact Sheet for Patients: EntrepreneurPulse.com.au   Fact Sheet for Healthcare Providers: IncredibleEmployment.be   This test is not yet approved or cleared by the [place] FDA and has been authorized for detection and/or diagnosis of SARS-CoV-2 by FDA under an Emergency Use Authorization (EUA). This EUA will remain in effect (meaning this test can be used) for the duration of the COVID-19 declaration under Section 564(b)(1) of the Act, 21 U.S.C. section 360bbb-3(b)(1), unless the authorization is terminated or revoked.   Performed at [facility], [place] [address], [place], [place] [address]        Labs: BNP (last 3 results) Recent Labs (within last 365 days)       Recent Labs    08/15/20 0459 08/16/20 0115 08/17/20 0046  BNP 65.8 120.6* 128.6*      Basic Metabolic Panel: Last Labs           Recent Labs  Lab 08/14/20 0313 08/14/20 1438 08/15/20 0459 08/16/20 0115 08/17/20 0046 08/18/20 0124  NA  -- 136 138 136 137 136  K  -- 3.3* 3.1* 3.3* 3.6 4.0  CL  -- 102 102 104 105 102  CO2  -- '25 28 26 26 26  '$ GLUCOSE  -- 317* 207* 211* 322* 245*  BUN  -- '6 6 6 8 8  '$ CREATININE  -- 0.78 0.68 0.80 0.90 0.77  CALCIUM  -- 8.2* 8.2* 8.6* 8.8* 8.8*  MG 1.8  -- 1.8 1.8 1.9  --      Liver Function Tests: Last Labs          Recent Labs  Lab 08/12/20 0727 08/15/20 0459 08/16/20 0115 08/17/20 0046 08/18/20 0124  AST '22 20 21 22 22  '$ ALT '27 19 21 24 25  '$ ALKPHOS 70 37* 46 54 57  BILITOT 1.0 0.6 0.5 0.5 0.8  PROT 7.9 5.5* 5.7* 5.8* 6.2*  ALBUMIN 4.1  2.8* 2.8* 2.9* 3.1*      Last Labs   No results for input(s): LIPASE, AMYLASE in the last 168 hours.   Last Labs   No results for input(s): AMMONIA in the last 168 hours.   CBC: Last Labs          Recent Labs  Lab 08/15/20 0856 08/16/20 0115 08/16/20 1103 08/17/20 0046 08/18/20 0124  WBC 6.7 6.4 6.7 6.3 8.3  NEUTROABS  -- 3.3  -- 3.1 4.4  HGB 10.3* 9.5* 10.1* 9.0* 9.6*  HCT 29.7* 27.5* 30.4* 27.0* 27.8*  MCV 84.9 85.7 86.4 86.8 86.1  PLT 112* 113* 147* 150 160      Cardiac Enzymes: Last Labs   No results for input(s): CKTOTAL, CKMB, CKMBINDEX, TROPONINI in the last 168 hours.   BNP: Last Labs   Invalid input(s): POCBNP   CBG: Last Labs          Recent Labs  Lab 08/17/20 0806 08/17/20 1157 08/17/20 1620 08/18/20 0726 08/18/20 1141  GLUCAP 211* 164* 261* 206* 284*      D-Dimer Recent Labs (last 2 labs)   No results for input(s): DDIMER in the last 72 hours.   Hgb A1c Recent Labs (last 2 labs)   No results for input(s): HGBA1C in the last 72 hours.   Lipid Profile Recent Labs (last 2 labs)   No results for input(s): CHOL, HDL, LDLCALC, TRIG, CHOLHDL, LDLDIRECT in the last 72 hours.   Thyroid function studies  Recent Labs (last 2 labs)   No results for input(s): TSH, T4TOTAL, T3FREE, THYROIDAB in the last 72 hours.   Invalid input(s): FREET3   Anemia work up [employer] (last 2 labs)   No results for input(s): VITAMINB12, FOLATE, FERRITIN, TIBC, IRON, RETICCTPCT in the last 72 hours.   Urinalysis Labs (Brief)          Component Value Date/Time    COLORURINE YELLOW 03/08/2020 0531    APPEARANCEUR HAZY (A) 03/08/2020 0531    LABSPEC 1.017 03/08/2020 0531    PHURINE 6.0 03/08/2020 0531    GLUCOSEU NEGATIVE 03/08/2020 0531    HGBUR NEGATIVE 03/08/2020 0531    BILIRUBINUR NEGATIVE 03/08/2020 0531    KETONESUR NEGATIVE 03/08/2020 0531    PROTEINUR NEGATIVE 03/08/2020 0531    NITRITE NEGATIVE 03/08/2020 0531    LEUKOCYTESUR NEGATIVE  03/08/2020 0531      Sepsis Labs Last Labs   Invalid input(s): PROCALCITONIN,  WBC,  LACTICIDVEN   Microbiology        Recent Results (from the past 240 hour(s))  Resp Panel by RT-PCR (Flu A&B, Covid) Nasopharyngeal Swab     Status: None    Collection Time: 08/12/20  8:01 AM    Specimen: Nasopharyngeal Swab; Nasopharyngeal(NP) swabs in vial transport medium  Result Value Ref Range Status    SARS Coronavirus 2 by RT PCR NEGATIVE NEGATIVE Final      Comment: (NOTE) SARS-CoV-2 target nucleic acids are NOT DETECTED.  The SARS-CoV-2 RNA is generally detectable in upper respiratory specimens during the acute phase of infection. The lowest concentration of SARS-CoV-2 viral copies this assay can detect is 138 copies/mL. A negative result does not preclude SARS-Cov-2 infection and should not be used as the sole basis for treatment or other patient management decisions. A negative result may occur with improper specimen collection/handling, submission of specimen other than nasopharyngeal swab, presence of viral mutation(s) within the areas targeted by this assay, and inadequate number of viral copies(<138 copies/mL). A negative result must be combined with clinical observations, patient history, and epidemiological information. The expected result is Negative.   Fact Sheet for Patients: EntrepreneurPulse.com.au   Fact Sheet for Healthcare Providers: IncredibleEmployment.be   This test is no t yet approved or cleared by the [place] FDA and has been authorized for detection and/or diagnosis of SARS-CoV-2 by FDA under an Emergency Use Authorization (EUA). This EUA will remain in effect (meaning this test can be used) for the duration of the COVID-19 declaration under Section 564(b)(1) of the Act, 21 U.S.C.section 360bbb-3(b)(1), unless the authorization is terminated or revoked sooner.          Influenza A by PCR NEGATIVE NEGATIVE Final     Influenza B by PCR NEGATIVE NEGATIVE Final      Comment: (NOTE) The Xpert Xpress SARS-CoV-2/FLU/RSV plus assay is intended as an aid in the diagnosis of influenza from Nasopharyngeal swab specimens and should not be used as a sole basis for treatment. Nasal washings and aspirates are unacceptable for Xpert Xpress SARS-CoV-2/FLU/RSV testing.   Fact Sheet for Patients: EntrepreneurPulse.com.au   Fact Sheet for Healthcare Providers: IncredibleEmployment.be   This test is not yet approved or cleared by the [place] FDA and has been authorized for detection and/or diagnosis of SARS-CoV-2 by FDA under an Emergency Use Authorization (EUA). This EUA will remain in effect (meaning this test can be used) for the duration of the COVID-19 declaration under Section 564(b)(1) of the Act, 21 U.S.C. section 360bbb-3(b)(1), unless the authorization is terminated or revoked.   Performed at [facility], [place] [address] [place], [place] [address]          Time coordinating discharge: Over 30 minutes   SIGNED:     Phillips Climes, MD          [facility] 08/18/2020, 11:43 AM Pager   If 7PM-7AM, please contact night-coverage [email or web address] Password [email or web address]        Outpatient Medications Prior to Visit  Medication Sig Dispense Refill   acetaminophen (TYLENOL) 500 MG tablet Take 1,000 mg by mouth every 4 (four) hours as needed (pain).     allopurinol (ZYLOPRIM) 100 MG tablet TAKE 1 TABLET BY MOUTH EVERY DAY 90 tablet 0   apixaban (ELIQUIS) 5 MG TABS tablet Take 1 tablet (5 mg total) by mouth 2 (two) times daily. 60 tablet 5   atorvastatin (LIPITOR) 80 MG tablet Take 1 tablet (80 mg total) by mouth daily. 90 tablet 1   dapagliflozin propanediol (FARXIGA) 10 MG TABS tablet Take 1 tablet (10 mg total) by mouth daily before breakfast. 90 tablet 1   Glucagon (GVOKE HYPOPEN 2-PACK) 1 MG/0.2ML SOAJ Inject 1 Act into the skin daily as needed. 2 mL  5   isosorbide mononitrate (IMDUR) 30 MG 24 hr tablet Take 1 tablet (30 mg total) by mouth daily. 90 tablet 3   losartan (COZAAR) 50 MG tablet Take 1 tablet (50 mg total) by mouth daily. 90 tablet 3  Magnesium 300 MG CAPS Take 300 mg by mouth daily.     metFORMIN (GLUCOPHAGE) 1000 MG tablet Take 1 tablet (1,000 mg total) by mouth 2 (two) times daily with a meal. 180 tablet 1   metoprolol succinate (TOPROL XL) 25 MG 24 hr tablet Take 1 tablet (25 mg total) by mouth daily. 30 tablet 11   Multiple Vitamin (MULTIVITAMIN WITH MINERALS) TABS tablet Take 1 tablet by mouth daily.     pantoprazole (PROTONIX) 40 MG tablet Please take 40 mg oral twice daily x30 days, then 40 mg oral daily. 90 tablet 0   traMADol (ULTRAM) 50 MG tablet Take 1 tablet (50 mg total) by mouth every 6 (six) hours as needed. 90 tablet 3   Vitamin D, Ergocalciferol, (DRISDOL) 1.25 MG (50000 UNIT) CAPS capsule Take 50,000 Units by mouth every 7 (seven) days. Tuesdays     Semaglutide (RYBELSUS) 3 MG TABS Take 3 mg by mouth daily. 30 tablet 0   No facility-administered medications prior to visit.    ROS Review of Systems  Constitutional:  Negative for chills, diaphoresis, fatigue and fever.  HENT: Negative.    Eyes: Negative.   Respiratory:  Negative for cough, chest tightness, shortness of breath and wheezing.   Cardiovascular:  Negative for chest pain, palpitations and leg swelling.  Gastrointestinal:  Negative for abdominal pain, constipation, diarrhea, nausea and vomiting.  Endocrine: Negative for polydipsia, polyphagia and polyuria.  Genitourinary: Negative.  Negative for difficulty urinating.  Musculoskeletal:  Positive for arthralgias. Negative for back pain and myalgias.  Skin: Negative.  Negative for color change and pallor.  Neurological:  Negative for dizziness, weakness, light-headedness and numbness.  Hematological:  Negative for adenopathy. Does not bruise/bleed easily.  Psychiatric/Behavioral: Negative.      Objective:  BP 122/76 (BP Location: Left Arm, Patient Position: Sitting, Cuff Size: Large)   Pulse 97   Temp 98.8 F (37.1 C) (Oral)   Resp 16   Ht '5\' 8"'$  (1.727 m)   Wt 183 lb (83 kg)   SpO2 97%   BMI 27.83 kg/m   BP Readings from Last 3 Encounters:  08/25/20 122/76  08/18/20 119/79  08/03/20 126/78    Wt Readings from Last 3 Encounters:  08/25/20 183 lb (83 kg)  08/13/20 177 lb 4 oz (80.4 kg)  08/03/20 180 lb (81.6 kg)    Physical Exam Vitals reviewed.  Constitutional:      Appearance: He is not ill-appearing.  HENT:     Nose: Nose normal.     Mouth/Throat:     Mouth: Mucous membranes are moist.  Eyes:     General: No scleral icterus.    Conjunctiva/sclera: Conjunctivae normal.  Cardiovascular:     Rate and Rhythm: Normal rate and regular rhythm.     Heart sounds: No murmur heard. Pulmonary:     Effort: Pulmonary effort is normal.     Breath sounds: No stridor. No wheezing, rhonchi or rales.  Abdominal:     General: Abdomen is flat. Bowel sounds are normal. There is no distension.     Palpations: Abdomen is soft. There is no hepatomegaly, splenomegaly or mass.     Tenderness: There is no abdominal tenderness.  Musculoskeletal:        General: Normal range of motion.     Cervical back: Neck supple.     Right lower leg: No edema.     Left lower leg: No edema.  Lymphadenopathy:     Cervical: No cervical adenopathy.  Skin:  General: Skin is warm and dry.     Coloration: Skin is pale.  Neurological:     General: No focal deficit present.     Mental Status: He is alert.    Lab Results  Component Value Date   WBC 9.4 08/25/2020   HGB 10.8 (L) 08/25/2020   HCT 32.5 (L) 08/25/2020   PLT 292.0 08/25/2020   GLUCOSE 245 (H) 08/18/2020   CHOL 153 07/26/2020   TRIG 268.0 (H) 07/26/2020   HDL 35.20 (L) 07/26/2020   LDLDIRECT 93.0 07/26/2020   LDLCALC 73 12/21/2019   ALT 25 08/18/2020   AST 22 08/18/2020   NA 136 08/18/2020   K 4.0 08/18/2020   CL  102 08/18/2020   CREATININE 0.77 08/18/2020   BUN 8 08/18/2020   CO2 26 08/18/2020   TSH 1.033 02/23/2020   PSA 0.2 12/21/2019   INR 1.2 (H) 05/18/2020   HGBA1C 8.3 (H) 07/26/2020   MICROALBUR 387 12/21/2019    DG Chest Port 1 View  Result Date: 08/12/2020 CLINICAL DATA:  GI bleed. EXAM: PORTABLE CHEST 1 VIEW COMPARISON:  03/28/2020 FINDINGS: The cardiac silhouette, mediastinal and hilar contours is stable. The lungs are clear. No pleural effusions. No pulmonary lesions. The bony thorax is intact. IMPRESSION: No acute cardiopulmonary findings. Electronically Signed   By: Marijo Sanes M.D.   On: 08/12/2020 08:58   CT Angio Abd/Pel w/ and/or w/o  Result Date: 08/12/2020 CLINICAL DATA:  60 year old with melena. Evaluate for lower GI bleed. EXAM: CTA ABDOMEN AND PELVIS WITHOUT AND WITH CONTRAST TECHNIQUE: Multidetector CT imaging of the abdomen and pelvis was performed using the standard protocol during bolus administration of intravenous contrast. Multiplanar reconstructed images and MIPs were obtained and reviewed to evaluate the vascular anatomy. CONTRAST:  180m OMNIPAQUE IOHEXOL 350 MG/ML SOLN COMPARISON:  02/22/2020 FINDINGS: VASCULAR Aorta: Mild atherosclerotic disease in the in the abdominal aorta without aneurysm, dissection or stenosis. Celiac: Patent without evidence of aneurysm, dissection, vasculitis or significant stenosis. SMA: Patent without evidence of aneurysm, dissection, vasculitis or significant stenosis. Renals: Both renal arteries are patent without evidence of aneurysm, dissection, vasculitis, fibromuscular dysplasia or significant stenosis. IMA: Patent without evidence of aneurysm, dissection, vasculitis or significant stenosis. Inflow: Patent without evidence of aneurysm, dissection, vasculitis or significant stenosis. Proximal Outflow: Proximal femoral arteries are patent bilaterally. Veins: Portal venous system is patent. No gross abnormality to the IVC or renal veins. No  gross abnormality to the iliac veins. Review of the MIP images confirms the above findings. NON-VASCULAR Lower chest: Prior median sternotomy.  Lung bases are clear. Hepatobiliary: Diffuse low-attenuation of the liver is suggestive for steatosis. No focal liver lesion. Multiple gallstones without gallbladder inflammation or distension. No significant biliary dilatation. Pancreas: Unremarkable. No pancreatic ductal dilatation or surrounding inflammatory changes. Spleen: Normal in size without focal abnormality. Adrenals/Urinary Tract: Adrenal glands are unremarkable. Kidneys are normal, without renal calculi, focal lesion, or hydronephrosis. Bladder is unremarkable. Stomach/Bowel: Normal appearance of the stomach. Normal appearance of small bowel with focal bowel inflammation or obstruction. Normal appendix. Scattered colonic diverticula without acute inflammation. Again noted is large left inguinal hernia containing portion of the sigmoid colon. There is no evidence for wall thickening or inflammation associated with this large inguinal hernia. No evidence for active GI bleeding. Lymphatic: No lymphadenopathy in the abdomen or pelvis. Reproductive: Prostate is unremarkable. Other: Negative for ascites. Large left inguinal hernia containing sigmoid colon but no inflammatory changes. A tiny umbilical hernia containing fat. Fat containing right inguinal hernia.  Musculoskeletal: No acute bone abnormality. Degenerative facet disease in the lower lumbar spine. IMPRESSION: VASCULAR 1. No evidence for active GI bleeding. 2. Mild atherosclerotic disease in the abdominal aorta. Aortic Atherosclerosis (ICD10-I70.0). 3. Main visceral arteries are patent. NON-VASCULAR 1. No acute abnormalities in the abdomen or pelvis. 2. Again noted is a large left inguinal hernia containing a large portion of the sigmoid colon. No acute inflammatory changes associated with this hernia and no evidence for bowel obstruction. 3. Colonic  diverticulosis. 4. Cholelithiasis. 5. Hepatic steatosis. Electronically Signed   By: Markus Daft M.D.   On: 08/12/2020 14:12    Assessment & Plan:   Demaree was seen today for anemia.  Diagnoses and all orders for this visit:  Vitamin B12 deficiency anemia due to intrinsic factor deficiency -     cyanocobalamin ((VITAMIN B-12)) injection 1,000 mcg -     CBC with Differential/Platelet; Future -     Folate; Future -     Folate -     CBC with Differential/Platelet  Deficiency anemia- His H&H have improved but he still anemic.  Will continue B12 replacement therapy.  He has also developed iron deficiency anemia.  I recommended that he start an oral iron supplement and have referred him to hematology to consider iron infusions. -     CBC with Differential/Platelet; Future -     Iron; Future -     Folate; Future -     Ferritin; Future -     Ferritin -     Folate -     Iron -     CBC with Differential/Platelet  Type 2 diabetes mellitus with other circulatory complication, without long-term current use of insulin ([place])- His A1c is up to 8.3%.  Will increase the dose of the GLP-1 agonist. -     Semaglutide (RYBELSUS) 7 MG TABS; Take 7 mg by mouth daily.  Iron deficiency anemia due to chronic blood loss -     Ferric Maltol (ACCRUFER) 30 MG CAPS; Take 1 capsule by mouth in the morning and at bedtime. -     Ambulatory referral to Hematology / Oncology  I have discontinued Verlyn B. Gritton Jr[employer]s Rybelsus. I am also having him start on Rybelsus and [place]. Additionally, I am having him maintain his Vitamin D (Ergocalciferol), Magnesium, multivitamin with minerals, metoprolol succinate, losartan, dapagliflozin propanediol, [place] HypoPen 2-Pack, allopurinol, apixaban, isosorbide mononitrate, metFORMIN, traMADol, atorvastatin, acetaminophen, and pantoprazole. We administered cyanocobalamin.  Meds ordered this encounter  Medications   cyanocobalamin ((VITAMIN B-12)) injection 1,000 mcg    Semaglutide (RYBELSUS) 7 MG TABS    Sig: Take 7 mg by mouth daily.    Dispense:  90 tablet    Refill:  0   Ferric Maltol (ACCRUFER) 30 MG CAPS    Sig: Take 1 capsule by mouth in the morning and at bedtime.    Dispense:  180 capsule    Refill:  1      Follow-up: Return in about 3 months (around 11/25/2020).  Scarlette Calico, MD

## 2020-08-25 NOTE — Patient Instructions (Signed)
[employer] (25th ed., pp. 1059-1068). [place], [place]: Elsevier.">  Anemia  Anemia is a condition in which there is not enough red blood cells or hemoglobin in the blood. Hemoglobin is a substance in red blood cells thatcarries oxygen. When you do not have enough red blood cells or hemoglobin (are anemic), your body cannot get enough oxygen and your organs may not work properly. Asa result, you may feel very tired or have other problems. What are the causes? Common causes of anemia include: Excessive bleeding. Anemia can be caused by excessive bleeding inside or outside the body, including bleeding from the intestines or from heavy menstrual periods in females. Poor nutrition. Long-lasting (chronic) kidney, thyroid, and liver disease. Bone marrow disorders, spleen problems, and blood disorders. Cancer and treatments for cancer. HIV (human immunodeficiency virus) and AIDS (acquired immunodeficiency syndrome). Infections, medicines, and autoimmune disorders that destroy red blood cells. What are the signs or symptoms? Symptoms of this condition include: Minor weakness. Dizziness. Headache, or difficulties concentrating and sleeping. Heartbeats that feel irregular or faster than normal (palpitations). Shortness of breath, especially with exercise. Pale skin, lips, and nails, or cold hands and feet. Indigestion and nausea. Symptoms may occur suddenly or develop slowly. If your anemia is mild, you maynot have symptoms. How is this diagnosed? This condition is diagnosed based on blood tests, your medical history, and a physical exam. In some cases, a test may be needed in which cells are removed from the soft tissue inside of a bone and looked at under a microscope (bone marrow biopsy). Your health care provider may also check your stool (feces) for blood and may do additional testing to look for the cause of yourbleeding. Other tests may include: Imaging tests, such as a CT scan or  MRI. A procedure to see inside your esophagus and stomach (endoscopy). A procedure to see inside your colon and rectum (colonoscopy). How is this treated? Treatment for this condition depends on the cause. If you continue to lose a lot of blood, you may need to be treated at a hospital. Treatment may include: Taking supplements of iron, vitamin B12, or folic acid. Taking a hormone medicine (erythropoietin) that can help to stimulate red blood cell growth. Having a blood transfusion. This may be needed if you lose a lot of blood. Making changes to your diet. Having surgery to remove your spleen. Follow these instructions at home: Take over-the-counter and prescription medicines only as told by your health care provider. Take supplements only as told by your health care provider. Follow any diet instructions that you were given by your health care provider. Keep all follow-up visits as told by your health care provider. This is important. Contact a health care provider if: You develop new bleeding anywhere in the body. Get help right away if: You are very weak. You are short of breath. You have pain in your abdomen or chest. You are dizzy or feel faint. You have trouble concentrating. You have bloody stools, black stools, or tarry stools. You vomit repeatedly or you vomit up blood. These symptoms may represent a serious problem that is an emergency. Do not wait to see if the symptoms will go away. Get medical help right away. Call your local emergency services (911 in the [place] Do not drive yourself to the hospital. Summary Anemia is a condition in which you do not have enough red blood cells or enough of a substance in your red blood cells that carries oxygen (hemoglobin). Symptoms may occur suddenly   or develop slowly. If your anemia is mild, you may not have symptoms. This condition is diagnosed with blood tests, a medical history, and a physical exam. Other tests may be  needed. Treatment for this condition depends on the cause of the anemia. This information is not intended to replace advice given to you by your health care provider. Make sure you discuss any questions you have with your healthcare provider. Document Revised: 01/13/2019 Document Reviewed: 01/13/2019 Elsevier Patient Education  2022 [employer].

## 2020-08-26 DIAGNOSIS — D500 Iron deficiency anemia secondary to blood loss (chronic): Secondary | ICD-10-CM | POA: Insufficient documentation

## 2020-08-26 DIAGNOSIS — D539 Nutritional anemia, unspecified: Secondary | ICD-10-CM | POA: Insufficient documentation

## 2020-08-26 LAB — CBC WITH DIFFERENTIAL/PLATELET
Basophils Absolute: 0.1 10*3/uL (ref 0.0–0.1)
Basophils Relative: 0.8 % (ref 0.0–3.0)
Eosinophils Absolute: 0.2 10*3/uL (ref 0.0–0.7)
Eosinophils Relative: 2.4 % (ref 0.0–5.0)
HCT: 32.5 % — ABNORMAL LOW (ref 39.0–52.0)
Hemoglobin: 10.8 g/dL — ABNORMAL LOW (ref 13.0–17.0)
Lymphocytes Relative: 36.3 % (ref 12.0–46.0)
Lymphs Abs: 3.4 10*3/uL (ref 0.7–4.0)
MCHC: 33.1 g/dL (ref 30.0–36.0)
MCV: 84.6 fl (ref 78.0–100.0)
Monocytes Absolute: 0.5 10*3/uL (ref 0.1–1.0)
Monocytes Relative: 5.5 % (ref 3.0–12.0)
Neutro Abs: 5.2 10*3/uL (ref 1.4–7.7)
Neutrophils Relative %: 55 % (ref 43.0–77.0)
Platelets: 292 10*3/uL (ref 150.0–400.0)
RBC: 3.84 Mil/uL — ABNORMAL LOW (ref 4.22–5.81)
RDW: 16 % — ABNORMAL HIGH (ref 11.5–15.5)
WBC: 9.4 10*3/uL (ref 4.0–10.5)

## 2020-08-26 LAB — FOLATE: Folate: >24.4 ng/mL (ref >5.9)

## 2020-08-26 LAB — IRON: Iron: 16 ug/dL — ABNORMAL LOW (ref 42–165)

## 2020-08-26 LAB — FERRITIN: Ferritin: 20.6 ng/mL — ABNORMAL LOW (ref 22.0–322.0)

## 2020-08-26 MED ORDER — ACCRUFER 30 MG PO CAPS
1.0000 | ORAL_CAPSULE | Freq: Two times a day (BID) | ORAL | 1 refills | Status: DC
Start: 2020-08-26 — End: 2021-12-18

## 2020-08-26 NOTE — Progress Notes (Deleted)
Cardiology Office Note:    Date:  08/26/2020   ID:  Colton Neptune Darryle Mahfouz., DOB 1960/06/26, MRN [id number]  PCP:  [name], MD   [place]  Cardiologist:  Freada Bergeron, MD  Advanced Practice Provider:  No care team member to display Electrophysiologist:  None    Referring MD: [name], MD    History of Present Illness:    Colton Neptune Printes Lutsky. is a 60 y.o. male with a hx of DMII, HLD and HTN and recent prolonged admission at [facility] hospital where he presented with hematochezia with course complicated by chest pain/VT arrest requiring CPR later found to have multivessel CAD s/p CABG on 03/08/20. He now presents to clinic for follow-up from his recent hospitalization.   Patient was hospitalized at [facility] from 02/22/20-03/13/20 where he presented with hematochezia from diverticular source. On 02/23/20, the patient developed chest pain in the setting of a blood bowel movement that initially responded to SL NTG but then recurred with subsequent VT arrest requiring 2-3mn CPR and shock. Post-shock rhythm Afib. [place] at [address]. He was stabilized from GIB standpoint and underwent coronary angiography on 03/04/20 which showed 70% stenosis of the proximal RCA with 50% stenosis of the distal RCA, 70% stenosis of the right PDA, 99% stenosis of the third right PL, 99% stenosis of the second right PL, 90% stenosis of the mid LAD with an ostial LAD stenosis of 70%, 100% stenosis of the ostial to proximal circumflex, and 60% stenosis of the mid LM to ostial LAD.  Due to his severe proximal LAD stenosis and severe mid LAD stenosis coronary bypass grafting was recommended. He underwent successful CABG x5 on 03/08/20 with LIMA to distal LAD, SVG to PDA., SVG tp posterior lateral branch of the right coronary artery, left radial artery to the ramus intermediate and first obtuse marginal coronary arteries.  Following the procedure, he separated from cardiopulmonary  bypass without difficulty. He did well and was successfully discharged home on 03/13/20.  Seen by me on 03/28/20 where he was doing well. Obtained cardiac monitor which showed <1% burden of Afib, 3% burden of PVCs/NSVT. Given Afib outside of the post-operative setting, his plavix was stopped and [facility]  Last seen in clinic on 05/17/20 where he was doing well.   Today,  Past Medical History:  Diagnosis Date   CAD (coronary artery disease) of artery bypass graft 02/2020   Diabetes mellitus without complication (HCC)    GI bleed    Hyperlipidemia LDL goal <70    Hypertension    PAF (paroxysmal atrial fibrillation) ([place]     Past Surgical History:  Procedure Laterality Date   BIOPSY  08/17/2020   Procedure: BIOPSY;  Surgeon: NMauri Pole MD;  [facility]  Service: Endoscopy;;   COLONOSCOPY WITH PROPOFOL Left 02/27/2020   Procedure: COLONOSCOPY WITH PROPOFOL;  Surgeon: BOtis Brace MD;  Location: [facility] ENDOSCOPY;  Service: Gastroenterology;  Laterality: Left;   COLONOSCOPY WITH PROPOFOL N/A 08/13/2020   Procedure: COLONOSCOPY WITH PROPOFOL;  Surgeon: GGatha Mayer MD;  [facility];  Service: Endoscopy;  Laterality: N/A;   CORONARY ARTERY BYPASS GRAFT N/A 03/08/2020   Procedure: CORONARY ARTERY BYPASS GRAFTING (CABG) x 5 ON PUMP;  Surgeon: AWonda Olds MD;  Location: [facility] OR;  Service: Open Heart Surgery;  Laterality: N/A;   ENDOVEIN HARVEST OF GREATER SAPHENOUS VEIN Right 03/08/2020   Procedure: ENDOVEIN HARVEST OF GREATER SAPHENOUS  VEIN;  Surgeon: Wonda Olds, MD;  Location: [facility] OR;  Service: Open Heart Surgery;  Laterality: Right;   ESOPHAGOGASTRODUODENOSCOPY (EGD) WITH PROPOFOL N/A 02/27/2020   Procedure: ESOPHAGOGASTRODUODENOSCOPY (EGD) WITH PROPOFOL;  Surgeon: Otis Brace, MD;  Location: [facility] ENDOSCOPY;  Service: Gastroenterology;  Laterality: N/A;   ESOPHAGOGASTRODUODENOSCOPY (EGD) WITH PROPOFOL N/A 08/17/2020    Procedure: ESOPHAGOGASTRODUODENOSCOPY (EGD) WITH PROPOFOL;  Surgeon: Mauri Pole, MD;  Location: [place] ENDOSCOPY;  Service: Endoscopy;  Laterality: N/A;   LEFT HEART CATH AND CORONARY ANGIOGRAPHY N/A 03/04/2020   Procedure: LEFT HEART CATH AND CORONARY ANGIOGRAPHY;  Surgeon: Burnell Blanks, MD;  Location: [place] CV LAB;  Service: Cardiovascular;  Laterality: N/A;   RADIAL ARTERY HARVEST Left 03/08/2020   Procedure: RADIAL ARTERY HARVEST;  Surgeon: Wonda Olds, MD;  Location: [place];  Service: Open Heart Surgery;  Laterality: Left;   TEE WITHOUT CARDIOVERSION N/A 03/08/2020   Procedure: TRANSESOPHAGEAL ECHOCARDIOGRAM (TEE);  Surgeon: Wonda Olds, MD;  Location: [place];  Service: Open Heart Surgery;  Laterality: N/A;    Current Medications: No outpatient medications have been marked as taking for the 08/31/20 encounter (Appointment) with Freada Bergeron, MD.     Allergies:   Patient has no known allergies.   Social History   Socioeconomic History   Marital status: Married    Spouse name: Not on file   Number of children: Not on file   Years of education: Not on file   Highest education level: Not on file  Occupational History   Not on file  Tobacco Use   Smoking status: Never   Smokeless tobacco: Never  Substance and Sexual Activity   Alcohol use: Yes   Drug use: Not on file   Sexual activity: Not on file  Other Topics Concern   Not on file  Social History Narrative   Not on file   Social Determinants of Health   Financial Resource Strain: Not on file  Food Insecurity: Not on file  Transportation Needs: Not on file  Physical Activity: Not on file  Stress: Not on file  Social Connections: Not on file     Family History: The patient's family history is not on file.  ROS:   Please see the history of present illness.    Review of Systems  Constitutional:  Negative for chills and fever.  HENT:  Negative for hearing loss.   Eyes:  Negative  for blurred vision and redness.  Respiratory:  Negative for shortness of breath.   Cardiovascular:  Negative for chest pain, palpitations, orthopnea, claudication, leg swelling and PND.  Gastrointestinal:  Negative for melena, nausea and vomiting.  Genitourinary:  Negative for dysuria and flank pain.  Musculoskeletal:  Positive for joint pain and myalgias.  Neurological:  Negative for dizziness and loss of consciousness.  Psychiatric/Behavioral:  Negative for substance abuse.    EKGs/Labs/Other Studies Reviewed:    The following studies were reviewed today: Cardiac Monitor 03/28/20: Patient monitoring period was from 03/28/20-04/26/20 Overall burden of Afib was <1% (1 hour and 31 minutes in total). HR ranged in Afib from 93-157bpm Predominant rhythm was NSR with average HR 93bpm (range from 65bpm to 171bpm) Occasional ventricular ectopy (both PVCs and NSVT) with 3% burden; no sustained VT Rare SVE <1% No signficant pauses or sustained ventricular arrhythmias    Echocardiogram 02/25/2020: Impressions: 1. LVEF 50-55% with wall motion abnormalities suspicious for mid LAD. No  apical thrombus.   2. Left ventricular ejection fraction, by estimation, is  50 to 55%. The  left ventricle has low normal function. The left ventricle demonstrates  regional wall motion abnormalities (see scoring diagram/findings for  description). Left ventricular diastolic   parameters are consistent with Grade II diastolic dysfunction  (pseudonormalization). Elevated left atrial pressure.   3. Right ventricular systolic function is normal. The right ventricular  size is normal. There is moderately elevated pulmonary artery systolic  pressure. The estimated right ventricular systolic pressure is [id number] mmHg.   4. Left atrial size was mildly dilated.   5. The mitral valve is normal in structure. Moderate mitral valve  regurgitation. No evidence of mitral stenosis.   6. The aortic valve is normal in structure.  Aortic valve regurgitation is  not visualized. Mild to moderate aortic valve sclerosis/calcification is  present, without any evidence of aortic stenosis.   7. The inferior vena cava is normal in size with greater than 50%  respiratory variability, suggesting right atrial pressure of 3 mmHg.      Diagnostic cath 03/04/20 Dominance: Right       EKG:  EKG NSR, septal q waves, isolated PVC, HR 92  Recent Labs: 02/23/2020: TSH 1.033 07/26/2020: Pro B Natriuretic peptide (BNP) 37.0 08/17/2020: B Natriuretic Peptide 128.6; Magnesium 1.9 08/18/2020: ALT 25; BUN 8; Creatinine, Ser 0.77; Potassium 4.0; Sodium 136 08/25/2020: Hemoglobin 10.8; Platelets 292.0  Recent Lipid Panel    Component Value Date/Time   CHOL 153 07/26/2020 1144   TRIG 268.0 (H) 07/26/2020 1144   HDL 35.20 (L) 07/26/2020 1144   CHOLHDL 4 07/26/2020 1144   VLDL 53.6 (H) 07/26/2020 1144   LDLCALC 73 12/21/2019 0000   LDLDIRECT 93.0 07/26/2020 1144     Risk Assessment/Calculations:    CHA2DS2-VASc Score = 4  This indicates a 4.8% annual risk of stroke. The patient's score is based upon: CHF History: Yes HTN History: Yes Diabetes History: Yes Stroke History: No Vascular Disease History: Yes Age Score: 0 Gender Score: 0   Physical Exam:    VS:  There were no vitals taken for this visit.    Wt Readings from Last 3 Encounters:  08/25/20 183 lb (83 kg)  08/13/20 177 lb 4 oz (80.4 kg)  08/03/20 180 lb (81.6 kg)     GEN:  Well nourished, well developed in no acute distress HEENT: Normal NECK: No JVD; No carotid bruits CARDIAC: RRR, 2/6 systolic murmur, no rubs or gallops RESPIRATORY:  Clear to auscultation without rales, wheezing or rhonchi  ABDOMEN: Soft, non-tender, non-distended MUSCULOSKELETAL:  No edema; No deformity  SKIN: Warm and dry NEUROLOGIC:  Alert and oriented x 3 PSYCHIATRIC:  Normal affect   ASSESSMENT:    No diagnosis found.  PLAN:    In order of problems listed above:  #Multivessel  CAD #VT Arrest #NSTEMI: Patient presented with hematochezia with course complicated by chest pain with VT arrest on 01/04 requiring shock and 2-85mn of CPR with return of ROSC. High sensitivity troponin >15,000. EKG with Q waves in V1-V2 and as well as slight ST depression in V4-V6 and lead II. TTE with LVEF of 50-55% with hypokinesis of mid and distal anterior wall, mid and distal anterior septum, and apex as well as grade 2 diastolic dysfunction. Moderate MR and moderately elevated PASP of 48.7 mmHg. He was stabilized from GIB standpoint with successful extubation on 1/11 and underwent coronary angiography found to have multivessel CAD now s/p CABG on 03/08/20. Doing well post-operatively now transferred out of ICU. -S/p CABG on 1/18 with LIMA -> LAD,  LRA -> Ramus -> OM, SVG -> PDA,  SVG -> PLB of RCA -Stop ASA due to GIB -Continue atorvastatin '80mg'$  daily -No plavix given need for AC -Continue metop '25mg'$  XL daily  -Contnue losartan '50mg'$  daily -Planned to start cardiac rehab   #Paroxysmal Afib: Post-arrest rhythm demonstrated Afib with RVR. Cardiac monitor placed after discharge with episodes of sustained Afib (<1% burden) now on [facility]. CHADs-vasc 3 with 4% annual risk of stroke.  -Cardiac monitor with recurrent Afib -Continue eliquis '5mg'$  BID -Continue metop '25mg'$  XL daily  #HTN: -Continue metop '25mg'$  XL  -Continue losartan '50mg'$  daily -Goal Bps <120s/80s   #HLD: -Continue atorvastatin '80mg'$  daily   #GIB likely Secondary to Diverticular Bleed: Colonoscopy on 1/8 showed red clotted blood in the entire colon with multiple diverticula throughout. Felt to be from diverticular bleed. No further bleeding.  - EGD on 1/8 negative for bleeding or ulcer.  - S/p a total of 7 units of PRBCs (last received 4 units on 1/8)  - No recurrent bleeding--continue to monitor   #DMII:  Blood glucose much better controlled at home. -Continue metformin   Medication Adjustments/Labs and Tests Ordered: Current  medicines are reviewed at length with the patient today.  Concerns regarding medicines are outlined above.  No orders of the defined types were placed in this encounter.  No orders of the defined types were placed in this encounter.   There are no Patient Instructions on file for this visit.    Signed, Freada Bergeron, MD  08/26/2020 4:23 PM    [place]

## 2020-08-30 ENCOUNTER — Telehealth: Payer: Self-pay | Admitting: Internal Medicine

## 2020-08-30 NOTE — Telephone Encounter (Signed)
   [facility] calling for status pf prior auth for Ferric Maltol (ACCRUFER) 30 MG CAPS  Please call [phone] KEY: [id number]

## 2020-08-30 NOTE — Telephone Encounter (Signed)
PA has been initiated via CoverMyMeds

## 2020-08-31 ENCOUNTER — Encounter: Payer: Self-pay | Admitting: Cardiology

## 2020-08-31 ENCOUNTER — Ambulatory Visit (INDEPENDENT_AMBULATORY_CARE_PROVIDER_SITE_OTHER): Payer: Managed Care, Other (non HMO) | Admitting: Cardiology

## 2020-08-31 ENCOUNTER — Other Ambulatory Visit: Payer: Self-pay

## 2020-08-31 VITALS — BP 120/80 | HR 92 | Ht 68.0 in | Wt 183.8 lb

## 2020-08-31 DIAGNOSIS — I214 Non-ST elevation (NSTEMI) myocardial infarction: Secondary | ICD-10-CM

## 2020-08-31 DIAGNOSIS — I10 Essential (primary) hypertension: Secondary | ICD-10-CM

## 2020-08-31 DIAGNOSIS — Z951 Presence of aortocoronary bypass graft: Secondary | ICD-10-CM

## 2020-08-31 DIAGNOSIS — I2510 Atherosclerotic heart disease of native coronary artery without angina pectoris: Secondary | ICD-10-CM | POA: Diagnosis not present

## 2020-08-31 DIAGNOSIS — Z79899 Other long term (current) drug therapy: Secondary | ICD-10-CM

## 2020-08-31 DIAGNOSIS — E782 Mixed hyperlipidemia: Secondary | ICD-10-CM | POA: Diagnosis not present

## 2020-08-31 DIAGNOSIS — I480 Paroxysmal atrial fibrillation: Secondary | ICD-10-CM

## 2020-08-31 DIAGNOSIS — K922 Gastrointestinal hemorrhage, unspecified: Secondary | ICD-10-CM

## 2020-08-31 DIAGNOSIS — E1159 Type 2 diabetes mellitus with other circulatory complications: Secondary | ICD-10-CM

## 2020-08-31 MED ORDER — EZETIMIBE 10 MG PO TABS: 10.0000 mg | ORAL_TABLET | Freq: Every day | ORAL | 1 refills | Status: DC

## 2020-08-31 NOTE — Progress Notes (Signed)
   08/12/20 2211  Vitals  Temp 98.7 F (37.1 C)  Temp Source Oral  BP 103/65  BP Location Right Leg  BP Method Automatic  Patient Position (if appropriate) Lying  Pulse Rate (!) 112  Pulse Rate Source Monitor  ECG Heart Rate (!) 117  Resp (!) 27  Level of Consciousness  Level of Consciousness Alert  MEWS COLOR  MEWS Score Color Red  Oxygen Therapy  SpO2 96 %  O2 Device Room Air  Pain Assessment  Pain Scale 0-10  Pain Score 0  MEWS Score  MEWS Temp 0  MEWS Systolic 0  MEWS Pulse 2  MEWS RR 2  MEWS LOC 0  MEWS Score 4  Provider Notification  Provider Name/Title Olevia Bowens D.,MD (previously notified)  Date Provider Notified 08/12/20  Time Provider Notified 2054  Notification Type Page  Notification Reason Other (Comment)  Provider response See new orders  Date of Provider Response 08/12/20  Time of Provider Response 2055

## 2020-08-31 NOTE — Patient Instructions (Signed)
Medication Instructions:   START TAKING ZETIA 10 MG BY MOUTH DAILY  *If you need a refill on your cardiac medications before your next appointment, please call your pharmacy*   Lab Work:  IN 6 WEEKS HERE IN THE OFFICE--CHECK LIPIDS--PLEASE COME FASTING TO THIS LAB APPOINTMENT  If you have labs (blood work) drawn today and your tests are completely normal, you will receive your results only by: [place] (if you have MyChart) OR A paper copy in the mail If you have any lab test that is abnormal or we need to change your treatment, we will call you to review the results.   Testing/Procedures:  Your physician has requested that you have an echocardiogram. Echocardiography is a painless test that uses sound waves to create images of your heart. It provides your doctor with information about the size and shape of your heart and how well your heart's chambers and valves are working. This procedure takes approximately one hour. There are no restrictions for this procedure.   Follow-Up: At [facility], you and your health needs are our priority.  As part of our continuing mission to provide you with exceptional heart care, we have created designated Provider Care Teams.  These Care Teams include your primary Cardiologist (physician) and Advanced Practice Providers (APPs -  Physician Assistants and Nurse Practitioners) who all work together to provide you with the care you need, when you need it.  We recommend signing up for the patient portal called "MyChart".  Sign up information is provided on this After Visit Summary.  MyChart is used to connect with patients for Virtual Visits (Telemedicine).  Patients are able to view lab/test results, encounter notes, upcoming appointments, etc.  Non-urgent messages can be sent to your provider as well.   To learn more about what you can do with MyChart, go to [email or web address].    Your next appointment:   6 month(s)  The format for your  next appointment:   In Person  Provider:   [name], MD

## 2020-08-31 NOTE — Progress Notes (Signed)
Cardiology Office Note:    Date:  08/31/2020   ID:  Colton Neptune Wiiliam Plambeck., DOB Apr 05, 1960, MRN [id number]  PCP:  [name], MD   [place]  Cardiologist:  Freada Bergeron, MD  Advanced Practice Provider:  No care team member to display Electrophysiologist:  None    Referring MD: [name], MD    History of Present Illness:    Colton Neptune Davondre Steinberg. is a 60 y.o. male with a hx of DMII, HLD and HTN and recent prolonged admission at [facility] hospital where he presented with hematochezia with course complicated by chest pain/VT arrest requiring CPR later found to have multivessel CAD s/p CABG on 03/08/20. He now presents to clinic for follow-up from his recent hospitalization.   Patient was hospitalized at [facility] from 02/22/20-03/13/20 where he presented with hematochezia from diverticular source. On 02/23/20, the patient developed chest pain in the setting of a blood bowel movement that initially responded to SL NTG but then recurred with subsequent VT arrest requiring 2-30mn CPR and shock. Post-shock rhythm Afib. [place] at [id number]. He was stabilized from GIB standpoint and underwent coronary angiography on 03/04/20 which showed 70% stenosis of the proximal RCA with 50% stenosis of the distal RCA, 70% stenosis of the right PDA, 99% stenosis of the third right PL, 99% stenosis of the second right PL, 90% stenosis of the mid LAD with an ostial LAD stenosis of 70%, 100% stenosis of the ostial to proximal circumflex, and 60% stenosis of the mid LM to ostial LAD.  Due to his severe proximal LAD stenosis and severe mid LAD stenosis coronary bypass grafting was recommended. He underwent successful CABG x5 on 03/08/20 with LIMA to distal LAD, SVG to PDA., SVG tp posterior lateral branch of the right coronary artery, left radial artery to the ramus intermediate and first obtuse marginal coronary arteries.  Following the procedure, he separated from cardiopulmonary  bypass without difficulty. He did well and was successfully discharged home on 03/13/20.  Seen by me on 03/28/20 where he was doing well. Obtained cardiac monitor which showed <1% burden of Afib, 3% burden of PVCs/NSVT. Given Afib outside of the post-operative setting, his plavix was stopped and [facility]   Last seen in clinic on 05/17/20 where he was doing well.   [facility]. During admission, he required 6units pRBCs total. His ASA and apixaban were initially held. Colonoscopy revealed severe diverticulosis but no active bleeding. EGD with nonbleeding gastric ulcers. Tagged RVC scan was also negative. Patient overall improved and H/H remained stable with conservative management with PPI. He was resumed on his apixaban which he tolerated and was discharged home.  Since being discharged, the patient states he has been doing overall well. No melena, BRBPR or hematuria. Has been more active with no chest pain or SOB. Denies abdominal pain, diarrhea, palpitations, nausea, headaches, lightheadedness, LE Edema, syncope,orthopnea or PND. Has been taking his medications as prescribed with no issues. Blood pressure well controlled at home.   Past Medical History:  Diagnosis Date   CAD (coronary artery disease) of artery bypass graft 02/2020   Diabetes mellitus without complication (HCC)    GI bleed    Hyperlipidemia LDL goal <70    Hypertension    PAF (paroxysmal atrial fibrillation) ([facility]     Past Surgical History:  Procedure Laterality Date   BIOPSY  08/17/2020   Procedure: BIOPSY;  Surgeon: Mauri Pole, MD;  Location: [place];  Service: Endoscopy;;   COLONOSCOPY WITH PROPOFOL Left 02/27/2020   Procedure: COLONOSCOPY WITH PROPOFOL;  Surgeon: Otis Brace, MD;  Location: [facility] ENDOSCOPY;  Service: Gastroenterology;  Laterality: Left;   COLONOSCOPY WITH PROPOFOL N/A 08/13/2020   Procedure: COLONOSCOPY WITH  PROPOFOL;  Surgeon: Gatha Mayer, MD;  Location: [facility] ENDOSCOPY;  Service: Endoscopy;  Laterality: N/A;   CORONARY ARTERY BYPASS GRAFT N/A 03/08/2020   Procedure: CORONARY ARTERY BYPASS GRAFTING (CABG) x 5 ON PUMP;  Surgeon: Wonda Olds, MD;  Location: [facility] OR;  Service: Open Heart Surgery;  Laterality: N/A;   ENDOVEIN HARVEST OF GREATER SAPHENOUS VEIN Right 03/08/2020   Procedure: ENDOVEIN HARVEST OF GREATER SAPHENOUS VEIN;  Surgeon: Wonda Olds, MD;  Location: [place];  Service: Open Heart Surgery;  Laterality: Right;   ESOPHAGOGASTRODUODENOSCOPY (EGD) WITH PROPOFOL N/A 02/27/2020   Procedure: ESOPHAGOGASTRODUODENOSCOPY (EGD) WITH PROPOFOL;  Surgeon: Otis Brace, MD;  Location: [facility] ENDOSCOPY;  Service: Gastroenterology;  Laterality: N/A;   ESOPHAGOGASTRODUODENOSCOPY (EGD) WITH PROPOFOL N/A 08/17/2020   Procedure: ESOPHAGOGASTRODUODENOSCOPY (EGD) WITH PROPOFOL;  Surgeon: Mauri Pole, MD;  Location: [place] ENDOSCOPY;  Service: Endoscopy;  Laterality: N/A;   LEFT HEART CATH AND CORONARY ANGIOGRAPHY N/A 03/04/2020   Procedure: LEFT HEART CATH AND CORONARY ANGIOGRAPHY;  Surgeon: Burnell Blanks, MD;  Location: [place] CV LAB;  Service: Cardiovascular;  Laterality: N/A;   RADIAL ARTERY HARVEST Left 03/08/2020   Procedure: RADIAL ARTERY HARVEST;  Surgeon: Wonda Olds, MD;  Location: [place];  Service: Open Heart Surgery;  Laterality: Left;   TEE WITHOUT CARDIOVERSION N/A 03/08/2020   Procedure: TRANSESOPHAGEAL ECHOCARDIOGRAM (TEE);  Surgeon: Wonda Olds, MD;  Location: [place];  Service: Open Heart Surgery;  Laterality: N/A;    Current Medications: Current Meds  Medication Sig   acetaminophen (TYLENOL) 500 MG tablet Take 1,000 mg by mouth every 4 (four) hours as needed (pain).   allopurinol (ZYLOPRIM) 100 MG tablet TAKE 1 TABLET BY MOUTH EVERY DAY   apixaban (ELIQUIS) 5 MG TABS tablet Take 1 tablet (5 mg total) by mouth 2 (two) times daily.   atorvastatin (LIPITOR) 80  MG tablet Take 1 tablet (80 mg total) by mouth daily.   dapagliflozin propanediol (FARXIGA) 10 MG TABS tablet Take 1 tablet (10 mg total) by mouth daily before breakfast.   ezetimibe (ZETIA) 10 MG tablet Take 1 tablet (10 mg total) by mouth daily.   Ferric Maltol (ACCRUFER) 30 MG CAPS Take 1 capsule by mouth in the morning and at bedtime.   Glucagon (GVOKE HYPOPEN 2-PACK) 1 MG/0.2ML SOAJ Inject 1 Act into the skin daily as needed.   isosorbide mononitrate (IMDUR) 30 MG 24 hr tablet Take 1 tablet (30 mg total) by mouth daily.   losartan (COZAAR) 50 MG tablet Take 1 tablet (50 mg total) by mouth daily.   Magnesium 300 MG CAPS Take 300 mg by mouth daily.   metFORMIN (GLUCOPHAGE) 1000 MG tablet Take 1 tablet (1,000 mg total) by mouth 2 (two) times daily with a meal.   metoprolol succinate (TOPROL XL) 25 MG 24 hr tablet Take 1 tablet (25 mg total) by mouth daily.   Multiple Vitamin (MULTIVITAMIN WITH MINERALS) TABS tablet Take 1 tablet by mouth daily.   pantoprazole (PROTONIX) 40 MG tablet Please take 40 mg oral twice daily x30 days, then 40 mg oral daily.   Semaglutide (RYBELSUS) 7 MG TABS Take 7 mg by mouth daily.   traMADol Veatrice Bourbon)  50 MG tablet Take 1 tablet (50 mg total) by mouth every 6 (six) hours as needed.   Vitamin D, Ergocalciferol, (DRISDOL) 1.25 MG (50000 UNIT) CAPS capsule Take 50,000 Units by mouth every 7 (seven) days. Tuesdays     Allergies:   Patient has no known allergies.   Social History   Socioeconomic History   Marital status: Married    Spouse name: Not on file   Number of children: Not on file   Years of education: Not on file   Highest education level: Not on file  Occupational History   Not on file  Tobacco Use   Smoking status: Never   Smokeless tobacco: Never  Substance and Sexual Activity   Alcohol use: Yes   Drug use: Not on file   Sexual activity: Not on file  Other Topics Concern   Not on file  Social History Narrative   Not on file   Social  Determinants of Health   Financial Resource Strain: Not on file  Food Insecurity: Not on file  Transportation Needs: Not on file  Physical Activity: Not on file  Stress: Not on file  Social Connections: Not on file     Family History: The patient's family history is not on file.  ROS:   Please see the history of present illness.    Review of Systems  Constitutional:  Negative for chills and fever.  HENT:  Negative for hearing loss.   Eyes:  Negative for blurred vision and redness.  Respiratory:  Negative for shortness of breath.   Cardiovascular:  Negative for chest pain, palpitations, orthopnea, claudication, leg swelling and PND.  Gastrointestinal:  Negative for melena, nausea and vomiting.  Genitourinary:  Negative for dysuria and flank pain.  Musculoskeletal:  Negative for joint pain and myalgias.  Neurological:  Negative for dizziness and loss of consciousness.  Psychiatric/Behavioral:  Negative for substance abuse.    EKGs/Labs/Other Studies Reviewed:    The following studies were reviewed today: Cardiac Monitor 03/28/20: Patient monitoring period was from 03/28/20-04/26/20 Overall burden of Afib was <1% (1 hour and 31 minutes in total). HR ranged in Afib from 93-157bpm Predominant rhythm was NSR with average HR 93bpm (range from 65bpm to 171bpm) Occasional ventricular ectopy (both PVCs and NSVT) with 3% burden; no sustained VT Rare SVE <1% No signficant pauses or sustained ventricular arrhythmias    Echocardiogram 02/25/2020: Impressions: 1. LVEF 50-55% with wall motion abnormalities suspicious for mid LAD. No  apical thrombus.   2. Left ventricular ejection fraction, by estimation, is 50 to 55%. The  left ventricle has low normal function. The left ventricle demonstrates  regional wall motion abnormalities (see scoring diagram/findings for  description). Left ventricular diastolic   parameters are consistent with Grade II diastolic dysfunction   (pseudonormalization). Elevated left atrial pressure.   3. Right ventricular systolic function is normal. The right ventricular  size is normal. There is moderately elevated pulmonary artery systolic  pressure. The estimated right ventricular systolic pressure is [id number] mmHg.   4. Left atrial size was mildly dilated.   5. The mitral valve is normal in structure. Moderate mitral valve  regurgitation. No evidence of mitral stenosis.   6. The aortic valve is normal in structure. Aortic valve regurgitation is  not visualized. Mild to moderate aortic valve sclerosis/calcification is  present, without any evidence of aortic stenosis.   7. The inferior vena cava is normal in size with greater than 50%  respiratory variability, suggesting right atrial pressure of  3 mmHg.      Diagnostic cath 03/04/20 Dominance: Right       EKG:  No new tracing today 05/17/2020: NSR, septal q waves, isolated PVC, HR 92  Recent Labs: 02/23/2020: TSH 1.033 07/26/2020: Pro B Natriuretic peptide (BNP) 37.0 08/17/2020: B Natriuretic Peptide 128.6; Magnesium 1.9 08/18/2020: ALT 25; BUN 8; Creatinine, Ser 0.77; Potassium 4.0; Sodium 136 08/25/2020: Hemoglobin 10.8; Platelets 292.0  Recent Lipid Panel    Component Value Date/Time   CHOL 153 07/26/2020 1144   TRIG 268.0 (H) 07/26/2020 1144   HDL 35.20 (L) 07/26/2020 1144   CHOLHDL 4 07/26/2020 1144   VLDL 53.6 (H) 07/26/2020 1144   LDLCALC 73 12/21/2019 0000   LDLDIRECT 93.0 07/26/2020 1144     Risk Assessment/Calculations:    CHA2DS2-VASc Score = 4  This indicates a 4.8% annual risk of stroke. The patient's score is based upon: CHF History: Yes HTN History: Yes Diabetes History: Yes Stroke History: No Vascular Disease History: Yes Age Score: 0 Gender Score: 0   Physical Exam:    VS:  BP 120/80   Pulse 92   Ht '5\' 8"'$  (1.727 m)   Wt 183 lb 12.8 oz (83.4 kg)   SpO2 97%   BMI 27.95 kg/m     Wt Readings from Last 3 Encounters:  08/31/20 183 lb 12.8  oz (83.4 kg)  08/25/20 183 lb (83 kg)  08/13/20 177 lb 4 oz (80.4 kg)     GEN:  Well nourished, well developed in no acute distress HEENT: Normal NECK: No JVD; No carotid bruits CARDIAC: RRR, 2/6 systolic murmur. No no rubs or gallops RESPIRATORY:  CTAB ABDOMEN: Soft, non-tender, non-distended MUSCULOSKELETAL:  No edema; No deformity  SKIN: Warm and dry NEUROLOGIC:  Alert and oriented x 3 PSYCHIATRIC:  Normal affect   ASSESSMENT:    1. Coronary artery disease involving native coronary artery of native heart without angina pectoris   2. Mixed hyperlipidemia   3. H/O five vessel coronary artery bypass   4. Medication management   5. Non-ST elevation (NSTEMI) myocardial infarction (HCC)   6. Paroxysmal atrial fibrillation ([place])   7. Type 2 diabetes mellitus with other circulatory complication, without long-term current use of insulin ([place])   8. Primary hypertension   9. Gastrointestinal hemorrhage, unspecified gastrointestinal hemorrhage type    PLAN:    In order of problems listed above:  #Multivessel CAD #VT Arrest #NSTEMI: Patient presented with hematochezia with course complicated by chest pain with VT arrest on 02/23/20 requiring shock and 2-54mn of CPR with return of ROSC. High sensitivity troponin >15,000. EKG with Q waves in V1-V2 and as well as slight ST depression in V4-V6 and lead II. TTE with LVEF of 50-55% with hypokinesis of mid and distal anterior wall, mid and distal anterior septum, and apex as well as grade 2 diastolic dysfunction. Moderate MR and moderately elevated PASP of 48.7 mmHg. He was stabilized from GIB standpoint with successful extubation on 03/01/20 and underwent coronary angiography found to have multivessel CAD now s/p CABG on 03/08/20. Doing well post-operatively with no anginal symptoms. Tolerating medications as prescribed.  -S/p CABG on 1/18 with LIMA -> LAD, LRA -> Ramus -> OM, SVG -> PDA,  SVG -> PLB of RCA -Off ASA due to recurrent  GIB -Continue atorvastatin '80mg'$  daily -No plavix given need for AC -Continue metop '25mg'$  XL daily  -Contnue losartan '50mg'$  daily -Plans to start cardiac rehab   #Paroxysmal Afib: Post-arrest rhythm demonstrated Afib with RVR. Cardiac  monitor placed after discharge with episodes of sustained Afib (<1% burden) now on [facility]. CHADs-vasc 3 with 4% annual risk of stroke.  -Cardiac monitor with recurrent Afib -Continue eliquis '5mg'$  BID; no recurrent GIB since resuming the medication -Continue metop '25mg'$  XL daily  #HTN: Well controlled and at goal <120s/80s. -Continue metop '25mg'$  XL  -Continue losartan '50mg'$  daily -Goal Bps <120s/80s   #HLD: LDL above goal at 93. TG also elevated at 268 -Continue atorvastatin '80mg'$  daily -Start zetia '10mg'$  daily -Discussed need to control his BG as this will impact TG levels -If TG still >200 at next visit, can consider vascepa   #Recurrent GIB: Had initialy GIB during hospitalization in 02/2020 thought to be diverticular in nature. Recurrent GIB in 07/2020 as detailed above with relatively unrevealing work-up (? Diverticular vs gastric ulcer). Now stable and off ASA. -Off ASA -Continue apixaban, H/H remains stable -Continue PPI   #DMII:  A1C above goal at 8.3 -Continue metformin '1000mg'$  BID -Continue farxiga '10mg'$  daily -Continue semaglutide '7mg'$  daily -Follow-up with PCP as scheduled  Follow up in 20month   Medication Adjustments/Labs and Tests Ordered: Current medicines are reviewed at length with the patient today.  Concerns regarding medicines are outlined above.  Orders Placed This Encounter  Procedures   Lipid Profile   ECHOCARDIOGRAM COMPLETE   Meds ordered this encounter  Medications   ezetimibe (ZETIA) 10 MG tablet    Sig: Take 1 tablet (10 mg total) by mouth daily.    Dispense:  90 tablet    Refill:  1    Patient Instructions  Medication Instructions:   START TAKING ZETIA 10 MG BY MOUTH DAILY  *If you need a refill on your cardiac  medications before your next appointment, please call your pharmacy*   Lab Work:  IN 6 WEEKS HERE IN THE OFFICE--CHECK LIPIDS--PLEASE COME FASTING TO THIS LAB APPOINTMENT  If you have labs (blood work) drawn today and your tests are completely normal, you will receive your results only by: MFairfax(if you have MyChart) OR A paper copy in the mail If you have any lab test that is abnormal or we need to change your treatment, we will call you to review the results.   Testing/Procedures:  Your physician has requested that you have an echocardiogram. Echocardiography is a painless test that uses sound waves to create images of your heart. It provides your doctor with information about the size and shape of your heart and how well your heart's chambers and valves are working. This procedure takes approximately one hour. There are no restrictions for this procedure.   Follow-Up: [facility] you and your health needs are our priority.  As part of our continuing mission to provide you with exceptional heart care, we have created designated Provider Care Teams.  These Care Teams include your primary Cardiologist (physician) and Advanced Practice Providers (APPs -  Physician Assistants and Nurse Practitioners) who all work together to provide you with the care you need, when you need it.  We recommend signing up for the patient portal called "MyChart".  Sign up information is provided on this After Visit Summary.  MyChart is used to connect with patients for Virtual Visits (Telemedicine).  Patients are able to view lab/test results, encounter notes, upcoming appointments, etc.  Non-urgent messages can be sent to your provider as well.   To learn more about what you can do with MyChart, go to [email or web address]    Your next appointment:   6 month(s)  The format for your next appointment:   In Person  Provider:   [name], MD      Ardell Isaacs as a  scribe for Freada Bergeron, MD.,have documented all relevant documentation on the behalf of Freada Bergeron, MD,as directed by  Freada Bergeron, MD while in the presence of Freada Bergeron, MD.  I, Freada Bergeron, MD, have reviewed all documentation for this visit. The documentation on 08/31/20 for the exam, diagnosis, procedures, and orders are all accurate and complete.   Signed, Freada Bergeron, MD  08/31/2020 11:13 AM    [place]

## 2020-09-01 ENCOUNTER — Other Ambulatory Visit: Payer: Self-pay | Admitting: Physician Assistant

## 2020-09-01 ENCOUNTER — Other Ambulatory Visit: Payer: Self-pay | Admitting: Internal Medicine

## 2020-09-01 DIAGNOSIS — E1159 Type 2 diabetes mellitus with other circulatory complications: Secondary | ICD-10-CM

## 2020-09-01 DIAGNOSIS — M1A09X0 Idiopathic chronic gout, multiple sites, without tophus (tophi): Secondary | ICD-10-CM

## 2020-09-02 ENCOUNTER — Telehealth: Payer: Self-pay

## 2020-09-02 ENCOUNTER — Other Ambulatory Visit: Payer: Self-pay

## 2020-09-02 ENCOUNTER — Encounter: Payer: Self-pay | Admitting: Physical Therapy

## 2020-09-02 ENCOUNTER — Ambulatory Visit: Payer: Managed Care, Other (non HMO) | Admitting: Physical Therapy

## 2020-09-02 DIAGNOSIS — G8929 Other chronic pain: Secondary | ICD-10-CM

## 2020-09-02 DIAGNOSIS — D500 Iron deficiency anemia secondary to blood loss (chronic): Secondary | ICD-10-CM | POA: Diagnosis not present

## 2020-09-02 DIAGNOSIS — M542 Cervicalgia: Secondary | ICD-10-CM

## 2020-09-02 DIAGNOSIS — K922 Gastrointestinal hemorrhage, unspecified: Secondary | ICD-10-CM | POA: Diagnosis present

## 2020-09-02 DIAGNOSIS — M25512 Pain in left shoulder: Secondary | ICD-10-CM

## 2020-09-02 DIAGNOSIS — M25612 Stiffness of left shoulder, not elsewhere classified: Secondary | ICD-10-CM

## 2020-09-02 DIAGNOSIS — R252 Cramp and spasm: Secondary | ICD-10-CM

## 2020-09-02 NOTE — Therapy (Signed)
[place] [place], [place], [address] Phone: [phone]   Fax:  [phone]  Physical Therapy Evaluation  Patient Details  Name: Colton Guzman [facility]. MRN: [id number] Date of Birth: 1960-04-02 Referring Provider (PT): Buena Irish   Encounter Date: 09/02/2020   PT End of Session - 09/02/20 1137     Visit Number 1    Date for PT Re-Evaluation 11/25/20    PT Start Time 1057    PT Stop Time 1134    PT Time Calculation (min) 37 min    Activity Tolerance Patient tolerated treatment well    Behavior During Therapy [facility] for tasks assessed/performed             Past Medical History:  Diagnosis Date   CAD (coronary artery disease) of artery bypass graft 02/2020   Diabetes mellitus without complication ([place])    GI bleed    Hyperlipidemia LDL goal <70    Hypertension    PAF (paroxysmal atrial fibrillation) ([place])     Past Surgical History:  Procedure Laterality Date   BIOPSY  08/17/2020   Procedure: BIOPSY;  Surgeon: Mauri Pole, MD;  Location: [place];  Service: Endoscopy;;   COLONOSCOPY WITH PROPOFOL Left 02/27/2020   Procedure: COLONOSCOPY WITH PROPOFOL;  Surgeon: Otis Brace, MD;  [facility];  Service: Gastroenterology;  Laterality: Left;   COLONOSCOPY WITH PROPOFOL N/A 08/13/2020   Procedure: COLONOSCOPY WITH PROPOFOL;  Surgeon: Gatha Mayer, MD;  Location: [facility];  Service: Endoscopy;  Laterality: N/A;   CORONARY ARTERY BYPASS GRAFT N/A 03/08/2020   Procedure: CORONARY ARTERY BYPASS GRAFTING (CABG) x 5 ON PUMP;  Surgeon: Wonda Olds, MD;  [facility];  Service: Open Heart Surgery;  Laterality: N/A;   ENDOVEIN HARVEST OF GREATER SAPHENOUS VEIN Right 03/08/2020   Procedure: ENDOVEIN HARVEST OF GREATER SAPHENOUS VEIN;  Surgeon: Wonda Olds, MD;  Location: [place];  Service: Open Heart Surgery;  Laterality: Right;   ESOPHAGOGASTRODUODENOSCOPY (EGD) WITH PROPOFOL  N/A 02/27/2020   Procedure: ESOPHAGOGASTRODUODENOSCOPY (EGD) WITH PROPOFOL;  Surgeon: Otis Brace, MD;  [facility];  Service: Gastroenterology;  Laterality: N/A;   ESOPHAGOGASTRODUODENOSCOPY (EGD) WITH PROPOFOL N/A 08/17/2020   Procedure: ESOPHAGOGASTRODUODENOSCOPY (EGD) WITH PROPOFOL;  Surgeon: Mauri Pole, MD;  Location: [facility];  Service: Endoscopy;  Laterality: N/A;   LEFT HEART CATH AND CORONARY ANGIOGRAPHY N/A 03/04/2020   Procedure: LEFT HEART CATH AND CORONARY ANGIOGRAPHY;  Surgeon: Burnell Blanks, MD;  Location: [place] [facility];  Service: Cardiovascular;  Laterality: N/A;   RADIAL ARTERY HARVEST Left 03/08/2020   Procedure: RADIAL ARTERY HARVEST;  Surgeon: Wonda Olds, MD;  Location: [place];  Service: Open Heart Surgery;  Laterality: Left;   TEE WITHOUT CARDIOVERSION N/A 03/08/2020   Procedure: TRANSESOPHAGEAL ECHOCARDIOGRAM (TEE);  Surgeon: Wonda Olds, MD;  Location: [place];  Service: Open Heart Surgery;  Laterality: N/A;    There were no vitals filed for this visit.    Subjective Assessment - 09/02/20 1053     Subjective Pt reports that he had heart surgery last November 2021 d/t CAD; they took a vein from his L arm and he was unable to move arm for a while. After this, he developed L frozen shoulder and has experienced significant stiffness and pain. Pt reports overall pain and stiffness is improving but he still cannot lift arm above his head. Pt denies radiating pain into LUE, denies N/T in hands. Has had xrays  and [place] of L shoulder neg for any acute abnormalities. He was on lifting precautions <10lbs following surgery; will f/u with MD to check on any remaining precautions. He also notes stiffness/soreness in neck d/t increase in muscle guarding over the past year.    Pertinent History CAD, Type II DM    Limitations Lifting;House hold activities    Diagnostic tests xray and [place] L shoulder    Patient Stated Goals increase shoulder ROM  and decrease pain    Currently in Pain? Yes    Pain Score 5     Pain Location Shoulder    Pain Orientation Left    Pain Descriptors / Indicators Sharp    Pain Type Chronic pain    Pain Onset More than a month ago    Pain Frequency Intermittent    Aggravating Factors  reaching overhead, lifting objects    Pain Relieving Factors rest, medicine, heat                [facility] PT Assessment - 09/02/20 0001       Assessment   Medical Diagnosis L adhesive capsulitis    Referring Provider (PT) Buena Irish    Hand Dominance Right    Next MD Visit --   will f/u as needed or in 6 mos   Prior Therapy none      Precautions   Precautions None      Restrictions   Weight Bearing Restrictions No      Balance Screen   Has the patient fallen in the past 6 months No    Has the patient had a decrease in activity level because of a fear of falling?  No    Is the patient reluctant to leave their home because of a fear of falling?  No      Home Environment   Additional Comments some housework/yardwork; notes unable to use mower right now d/t pressure on L shoulder increasing pain      Prior Function   Level of Independence Independent    Vocation Requirements medical assistant    Leisure yardwork, walking, exercising at gym      Sensation   Light Touch Appears Intact      Posture/Postural Control   Posture/Postural Control Postural limitations    Postural Limitations Rounded Shoulders;Forward head    Posture Comments L shoulder elevated while resting      ROM / Strength   AROM / PROM / Strength AROM;Strength;PROM      AROM   Overall AROM Comments cervical AROM 25% limited with pain at end ranges; R shoulder AROM WFL    AROM Assessment Site Shoulder    Right/Left Shoulder Left    Left Shoulder Flexion 90 Degrees    Left Shoulder ABduction 75 Degrees    Left Shoulder Internal Rotation --   functionally unable   Left Shoulder External Rotation --   functionally unable     PROM    PROM Assessment Site Shoulder    Right/Left Shoulder Left    Left Shoulder Flexion 95 Degrees    Left Shoulder ABduction 75 Degrees      Strength   Overall Strength Comments RUE 5/5; LUE 4-/5 limited by pain      Palpation   Palpation comment very tender L UT/suboccipitals/periscapulars                        Objective measurements completed on examination: See above findings.  PT Education - 09/02/20 1137     Education Details Pt educated on POC and HEP    Person(s) Educated Patient    Methods Explanation;Demonstration;Handout    Comprehension Verbalized understanding;Returned demonstration              PT Short Term Goals - 09/02/20 1143       PT SHORT TERM GOAL #1   Title Pt will be I with initial HEP    Time 2    Period Weeks    Status New    Target Date 09/16/20               PT Long Term Goals - 09/02/20 1144       PT LONG TERM GOAL #1   Title Pt will be I with advanced HEP    Time 8    Period Weeks    Status New    Target Date 10/28/20      PT LONG TERM GOAL #2   Title Pt will demo L shoulder flexion >/= 145 deg    Baseline 90    Time 8    Period Weeks    Status New    Target Date 10/28/20      PT LONG TERM GOAL #3   Title Pt will demo L shoulder abduction >/= 120 deg    Baseline 70    Time 8    Period Weeks    Status New    Target Date 10/28/20      PT LONG TERM GOAL #4   Title Pt will report 50% reduction in L shoulder pain    Time 8    Period Weeks    Status New    Target Date 10/28/20                    Plan - 09/02/20 1139     Clinical Impression Statement Pt presents to clinic with signs/symptoms consistent with dx of L shoulder adhesive capsulitis. Developed L shoulder stiffness/pain following heart surgery in 12/2019. Demos very limited and painful L shoulder AROM/PROM along with neck stiffness/soreness. Will clarify any remaining lifting precautions from surgery with MD.  Also of note, pt had recent hospitalization d/t internal bleeding from anticoagulants. Pt would benefit from skilled PT to address shoulder ROM deficits and ease cervical soreness/stiffness.    Personal Factors and Comorbidities Comorbidity 2    Comorbidities CAD, DM Type II    Examination-Activity Limitations Lift;Reach Overhead    Examination-Participation Restrictions Community Activity;Interpersonal Relationship;Occupation    Stability/Clinical Decision Making Stable/Uncomplicated    Clinical Decision Making Low    Rehab Potential Good    PT Frequency 2x / week    PT Duration 8 weeks    PT Treatment/Interventions ADLs/Self Care Home Management;Electrical Stimulation;Iontophoresis '4mg'$ /ml Dexamethasone;Moist Heat;Therapeutic activities;Therapeutic exercise;Manual techniques;Passive range of motion;Dry needling    PT Next Visit Plan review/progress HEP, shoulder ROM/mobilizations, cervical ex's/manual to cervical, pt interested in DN    PT Home Exercise Plan see pt instructions (+ received shoulder pendulums and seated ex's from MD)    Consulted and Agree with Plan of Care Patient             Patient will benefit from skilled therapeutic intervention in order to improve the following deficits and impairments:  Decreased range of motion, Increased muscle spasms, Impaired UE functional use, Pain, Hypomobility, Impaired flexibility, Decreased strength, Postural dysfunction  Visit Diagnosis: Chronic left shoulder pain  Stiffness of left shoulder, not elsewhere  classified  Cramp and spasm  Cervicalgia     Problem List Patient Active Problem List   Diagnosis Date Noted   Iron deficiency anemia due to chronic blood loss 08/26/2020   Deficiency anemia 08/26/2020   PAF (paroxysmal atrial fibrillation) ([place]) 08/12/2020   Sinus tachycardia    Vitamin B12 deficiency anemia due to intrinsic factor deficiency 05/19/2020   Primary osteoarthritis involving multiple joints 05/19/2020    Dyslipidemia, goal LDL below 70 05/18/2020   T2DM (type 2 diabetes mellitus) ([place]) 02/22/2020   Hypertension    Gout    Amador Cunas, PT, DPT Donald Prose Vashaun Osmon 09/02/2020, 11:46 AM  [address] [place], [place], [address] Phone: [phone]   Fax:  [phone]  Name: Wesam Kidney [facility]. MRN: [id number] Date of Birth: 02/28/1960

## 2020-09-02 NOTE — Patient Instructions (Signed)
Access Code: [email or web address] URL: [email or web address] Date: 09/02/2020 Prepared by: Amador Cunas  Exercises Seated Shoulder Flexion Towel Slide at Table Top - 1 x daily - 7 x weekly - 3 sets - 10 reps - 5 sec hold Seated Shoulder Abduction Towel Slide at Table Top - 1 x daily - 7 x weekly - 3 sets - 10 reps - 5 sec hold Seated Upper Trapezius Stretch - 1 x daily - 7 x weekly - 3 sets - 2 reps - 20 sec hold Seated Levator Scapulae Stretch - 1 x daily - 7 x weekly - 3 sets - 2 reps - 20 sec hold

## 2020-09-05 ENCOUNTER — Ambulatory Visit: Payer: Managed Care, Other (non HMO) | Admitting: Physical Therapy

## 2020-09-06 ENCOUNTER — Other Ambulatory Visit: Payer: Self-pay | Admitting: Family

## 2020-09-06 ENCOUNTER — Encounter: Payer: Self-pay | Admitting: Family

## 2020-09-06 ENCOUNTER — Other Ambulatory Visit: Payer: Self-pay

## 2020-09-06 ENCOUNTER — Ambulatory Visit: Payer: Managed Care, Other (non HMO) | Admitting: Family

## 2020-09-06 ENCOUNTER — Inpatient Hospital Stay: Payer: Managed Care, Other (non HMO) | Attending: Family

## 2020-09-06 ENCOUNTER — Other Ambulatory Visit: Payer: Managed Care, Other (non HMO)

## 2020-09-06 ENCOUNTER — Inpatient Hospital Stay (HOSPITAL_BASED_OUTPATIENT_CLINIC_OR_DEPARTMENT_OTHER): Payer: Managed Care, Other (non HMO) | Admitting: Family

## 2020-09-06 ENCOUNTER — Encounter: Payer: Self-pay | Admitting: Internal Medicine

## 2020-09-06 VITALS — BP 118/73 | HR 87 | Temp 98.3°F | Resp 18 | Ht 68.0 in | Wt 186.1 lb

## 2020-09-06 DIAGNOSIS — Z808 Family history of malignant neoplasm of other organs or systems: Secondary | ICD-10-CM | POA: Diagnosis not present

## 2020-09-06 DIAGNOSIS — K922 Gastrointestinal hemorrhage, unspecified: Secondary | ICD-10-CM

## 2020-09-06 DIAGNOSIS — M25612 Stiffness of left shoulder, not elsewhere classified: Secondary | ICD-10-CM | POA: Insufficient documentation

## 2020-09-06 DIAGNOSIS — M25512 Pain in left shoulder: Secondary | ICD-10-CM | POA: Insufficient documentation

## 2020-09-06 DIAGNOSIS — D500 Iron deficiency anemia secondary to blood loss (chronic): Secondary | ICD-10-CM | POA: Diagnosis not present

## 2020-09-06 DIAGNOSIS — D649 Anemia, unspecified: Secondary | ICD-10-CM

## 2020-09-06 DIAGNOSIS — M542 Cervicalgia: Secondary | ICD-10-CM | POA: Insufficient documentation

## 2020-09-06 DIAGNOSIS — G8929 Other chronic pain: Secondary | ICD-10-CM | POA: Insufficient documentation

## 2020-09-06 DIAGNOSIS — R252 Cramp and spasm: Secondary | ICD-10-CM | POA: Insufficient documentation

## 2020-09-06 DIAGNOSIS — Z87891 Personal history of nicotine dependence: Secondary | ICD-10-CM | POA: Diagnosis not present

## 2020-09-06 LAB — RETICULOCYTES
Immature Retic Fract: 21.5 % — ABNORMAL HIGH (ref 2.3–15.9)
RBC.: 4.05 MIL/uL — ABNORMAL LOW (ref 4.22–5.81)
Retic Count, Absolute: 99.5 10*3/uL (ref 19.0–186.0)
Retic Ct Pct: 2.5 % (ref 0.4–3.1)

## 2020-09-06 LAB — CBC WITH DIFFERENTIAL (CANCER CENTER ONLY)
Abs Immature Granulocytes: 0.07 10*3/uL (ref 0.00–0.07)
Basophils Absolute: 0 10*3/uL (ref 0.0–0.1)
Basophils Relative: 1 %
Eosinophils Absolute: 0.2 10*3/uL (ref 0.0–0.5)
Eosinophils Relative: 2 %
HCT: 33.5 % — ABNORMAL LOW (ref 39.0–52.0)
Hemoglobin: 10.5 g/dL — ABNORMAL LOW (ref 13.0–17.0)
Immature Granulocytes: 1 %
Lymphocytes Relative: 34 %
Lymphs Abs: 2.3 10*3/uL (ref 0.7–4.0)
MCH: 26.4 pg (ref 26.0–34.0)
MCHC: 31.3 g/dL (ref 30.0–36.0)
MCV: 84.4 fL (ref 80.0–100.0)
Monocytes Absolute: 0.4 10*3/uL (ref 0.1–1.0)
Monocytes Relative: 6 %
Neutro Abs: 3.8 10*3/uL (ref 1.7–7.7)
Neutrophils Relative %: 56 %
Platelet Count: 205 10*3/uL (ref 150–400)
RBC: 3.97 MIL/uL — ABNORMAL LOW (ref 4.22–5.81)
RDW: 13.9 % (ref 11.5–15.5)
WBC Count: 6.7 10*3/uL (ref 4.0–10.5)
nRBC: 0 % (ref 0.0–0.2)

## 2020-09-06 LAB — CMP (CANCER CENTER ONLY)
ALT: 21 U/L (ref 0–44)
AST: 18 U/L (ref 15–41)
Albumin: 4.6 g/dL (ref 3.5–5.0)
Alkaline Phosphatase: 82 U/L (ref 38–126)
Anion gap: 10 (ref 5–15)
BUN: 13 mg/dL (ref 6–20)
CO2: 27 mmol/L (ref 22–32)
Calcium: 10 mg/dL (ref 8.9–10.3)
Chloride: 98 mmol/L (ref 98–111)
Creatinine: 0.84 mg/dL (ref 0.61–1.24)
GFR, Estimated: >60 mL/min (ref >60)
Glucose, Bld: 307 mg/dL — ABNORMAL HIGH (ref 70–99)
Potassium: 4 mmol/L (ref 3.5–5.1)
Sodium: 135 mmol/L (ref 135–145)
Total Bilirubin: 0.4 mg/dL (ref 0.3–1.2)
Total Protein: 8.5 g/dL — ABNORMAL HIGH (ref 6.5–8.1)

## 2020-09-06 LAB — LACTATE DEHYDROGENASE: LDH: 133 U/L (ref 98–192)

## 2020-09-06 LAB — SAVE SMEAR(SSMR), FOR PROVIDER SLIDE REVIEW

## 2020-09-06 NOTE — Progress Notes (Signed)
Hematology/Oncology Consultation   Name: Delvonta Rootes [facility].      MRN: [id number]    Location: Room/bed info not found  Date: 09/06/2020 Time:3:15 PM   REFERRING PHYSICIAN: Scarlette Calico, MD  REASON FOR CONSULT: Iron deficiency anemia due to chronic blood loss   DIAGNOSIS: Iron deficiency anemia due to chronic blood loss  HISTORY OF PRESENT ILLNESS: Mr. Abrar Lynde. is a very pleasant 60 yo Filipino gentleman with recent diagnosis of iron deficiency.  Her was hospitalized in June for anemia secondary to acute diverticular bleed. He had been on Eliquis and aspirin for atrial fib.  Endoscopy revealed small non bleeding gastric ulcers and colonoscopy revealed severe diverticulosis. He has history of diverticular bleed in the past as well.  He received 6 units of blood during admission and Eliquis was held until discharge. He still has not restarted the aspirin. He was also started on Protonix.  Since discharge he has not noted any blood loss. No dark tarry stool. No bruising or petechiae.  Earlier this month his iron level was 16 and ferritin 20. Folate was > 24.4.  Hgb today is 10.5, MCV 84, platelets 205 and WBC count 6.7.  He is taking a oral B12 and iron supplements daily.  His mother also had history of GI bleed/anemia with taking aspirin.  He states that he has stopped taking NSAIDS.  He denies fatigue.  He states that he has occasional dizziness. No falls or syncope to report.  He has occasional episodes of abdominal discomfort which he has associated with his history of gastric ulcers.  He had 2 brief episodes of chest discomfort while straining having a BM, the most recent yesterday. He has notified his cardiologist and is waiting to hear back.  No personal history of cancer. His sister passed away from thyroid cancer.  He is diabetic and is on Metformin, Rybelsus and [place]. Recent Hgb A1c was 8.3.  No history of thyroid disease.  No fever, chills, n/v, cough, rash, SOB,  palpitations or changes in bladder habits.  No swelling or tenderness in his extremities.  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  He quit smoking in 2012. No ETOH or recreational drug use. He has maintained a good appetite but states that he needs to better hydrate throughout the day. His weight is stable at 186 lbs.  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  He moved to [facility] from [place] a year ago after getting married.   ROS: All other 10 point review of systems is negative.   PAST MEDICAL HISTORY:   Past Medical History:  Diagnosis Date   CAD (coronary artery disease) of artery bypass graft 02/2020   Diabetes mellitus without complication ([facility])    GI bleed    Hyperlipidemia LDL goal <70    Hypertension    PAF (paroxysmal atrial fibrillation) ([facility])     ALLERGIES: No Known Allergies    MEDICATIONS:  Current Outpatient Medications on File Prior to Visit  Medication Sig Dispense Refill   acetaminophen (TYLENOL) 500 MG tablet Take 1,000 mg by mouth every 4 (four) hours as needed (pain).     allopurinol (ZYLOPRIM) 100 MG tablet TAKE 1 TABLET BY MOUTH EVERY DAY 90 tablet 0   apixaban (ELIQUIS) 5 MG TABS tablet Take 1 tablet (5 mg total) by mouth 2 (two) times daily. 60 tablet 5   atorvastatin (LIPITOR) 80 MG tablet Take 1 tablet (80  mg total) by mouth daily. 90 tablet 1   ezetimibe (ZETIA) 10 MG tablet Take 1 tablet (10 mg total) by mouth daily. 90 tablet 1   FARXIGA 10 MG TABS tablet TAKE 1 TABLET BY MOUTH DAILY BEFORE BREAKFAST. 90 tablet 1   Ferric Maltol (ACCRUFER) 30 MG CAPS Take 1 capsule by mouth in the morning and at bedtime. 180 capsule 1   Glucagon (GVOKE HYPOPEN 2-PACK) 1 MG/0.2ML SOAJ Inject 1 Act into the skin daily as needed. 2 mL 5   isosorbide mononitrate (IMDUR) 30 MG 24 hr tablet Take 1 tablet (30 mg total) by mouth daily. 90 tablet 3   losartan (COZAAR) 50 MG tablet Take 1 tablet (50 mg total) by mouth daily. 90  tablet 3   Magnesium 300 MG CAPS Take 300 mg by mouth daily.     metFORMIN (GLUCOPHAGE) 1000 MG tablet Take 1 tablet (1,000 mg total) by mouth 2 (two) times daily with a meal. 180 tablet 1   metoprolol succinate (TOPROL XL) 25 MG 24 hr tablet Take 1 tablet (25 mg total) by mouth daily. 30 tablet 11   Multiple Vitamin (MULTIVITAMIN WITH MINERALS) TABS tablet Take 1 tablet by mouth daily.     pantoprazole (PROTONIX) 40 MG tablet Please take 40 mg oral twice daily x30 days, then 40 mg oral daily. 90 tablet 0   Semaglutide (RYBELSUS) 7 MG TABS Take 7 mg by mouth daily. 90 tablet 0   traMADol (ULTRAM) 50 MG tablet Take 1 tablet (50 mg total) by mouth every 6 (six) hours as needed. 90 tablet 3   Vitamin D, Ergocalciferol, (DRISDOL) 1.25 MG (50000 UNIT) CAPS capsule Take 50,000 Units by mouth every 7 (seven) days. Tuesdays     No current facility-administered medications on file prior to visit.     PAST SURGICAL HISTORY Past Surgical History:  Procedure Laterality Date   BIOPSY  08/17/2020   Procedure: BIOPSY;  Surgeon: Mauri Pole, MD;  Location: [place];  Service: Endoscopy;;   COLONOSCOPY WITH PROPOFOL Left 02/27/2020   Procedure: COLONOSCOPY WITH PROPOFOL;  Surgeon: Otis Brace, MD;  [facility];  Service: Gastroenterology;  Laterality: Left;   COLONOSCOPY WITH PROPOFOL N/A 08/13/2020   Procedure: COLONOSCOPY WITH PROPOFOL;  Surgeon: Gatha Mayer, MD;  Location: [facility];  Service: Endoscopy;  Laterality: N/A;   CORONARY ARTERY BYPASS GRAFT N/A 03/08/2020   Procedure: CORONARY ARTERY BYPASS GRAFTING (CABG) x 5 ON PUMP;  Surgeon: Wonda Olds, MD;  [facility];  Service: Open Heart Surgery;  Laterality: N/A;   ENDOVEIN HARVEST OF GREATER SAPHENOUS VEIN Right 03/08/2020   Procedure: ENDOVEIN HARVEST OF GREATER SAPHENOUS VEIN;  Surgeon: Wonda Olds, MD;  Location: [place];  Service: Open Heart Surgery;  Laterality: Right;   ESOPHAGOGASTRODUODENOSCOPY  (EGD) WITH PROPOFOL N/A 02/27/2020   Procedure: ESOPHAGOGASTRODUODENOSCOPY (EGD) WITH PROPOFOL;  Surgeon: Otis Brace, MD;  [facility];  Service: Gastroenterology;  Laterality: N/A;   ESOPHAGOGASTRODUODENOSCOPY (EGD) WITH PROPOFOL N/A 08/17/2020   Procedure: ESOPHAGOGASTRODUODENOSCOPY (EGD) WITH PROPOFOL;  Surgeon: Mauri Pole, MD;  Location: [facility];  Service: Endoscopy;  Laterality: N/A;   LEFT HEART CATH AND CORONARY ANGIOGRAPHY N/A 03/04/2020   Procedure: LEFT HEART CATH AND CORONARY ANGIOGRAPHY;  Surgeon: Burnell Blanks, MD;  Location: [place] [facility];  Service: Cardiovascular;  Laterality: N/A;   RADIAL ARTERY HARVEST Left 03/08/2020   Procedure: RADIAL ARTERY HARVEST;  Surgeon: Wonda Olds, MD;  Location: [place];  Service:  Open Heart Surgery;  Laterality: Left;   TEE WITHOUT CARDIOVERSION N/A 03/08/2020   Procedure: TRANSESOPHAGEAL ECHOCARDIOGRAM (TEE);  Surgeon: Wonda Olds, MD;  Location: [place];  Service: Open Heart Surgery;  Laterality: N/A;    FAMILY HISTORY: No family history on file.  SOCIAL HISTORY:  reports that he has never smoked. He has never used smokeless tobacco. He reports current alcohol use. No history on file for drug use.  PERFORMANCE STATUS: The patient's performance status is 1 - Symptomatic but completely ambulatory  PHYSICAL EXAM: Most Recent Vital Signs: There were no vitals taken for this visit. BP 118/73 (BP Location: Left Arm, Patient Position: Sitting)   Pulse 87   Temp 98.3 F (36.8 C) (Oral)   Resp 18   Ht '5\' 8"'$  (1.727 m)   Wt 186 lb 1.3 oz (84.4 kg)   SpO2 100%   BMI 28.29 kg/m   General Appearance:    Alert, cooperative, no distress, appears stated age  Head:    Normocephalic, without obvious abnormality, atraumatic        Nose:   Nares normal, septum midline, mucosa normal, no drainage    or sinus tenderness  Throat:   Lips, mucosa, and tongue normal; teeth and gums normal  Neck:   Supple,  symmetrical, trachea midline, no adenopathy;       thyroid:  No enlargement/tenderness/nodules; no carotid   bruit or JVD  Back:     Symmetric, no curvature, ROM normal, no CVA tenderness  Lungs:     Clear to auscultation bilaterally, respirations unlabored  Chest wall:    No tenderness or deformity  Heart:    Regular rate and rhythm, S1 and S2 normal, no murmur, rub   or gallop  Abdomen:     Soft, non-tender, bowel sounds active all four quadrants,    no masses, no organomegaly        Extremities:   Extremities normal, atraumatic, no cyanosis or edema  Pulses:   2+ and symmetric all extremities  Skin:   Skin color, texture, turgor normal, no rashes or lesions  Lymph nodes:   Cervical, supraclavicular, and axillary nodes normal  Neurologic:   CNII-XII intact. Normal strength, sensation and reflexes      throughout    LABORATORY DATA:  Results for orders placed or performed in visit on 09/06/20 (from the past 48 hour(s))  Save Smear (SSMR)     Status: None   Collection Time: 09/06/20  2:54 PM  Result Value Ref Range   Smear Review SMEAR STAINED AND AVAILABLE FOR REVIEW     Comment: Performed at [facility] at [facility], [address], [place], [place] [address]  Reticulocytes     Status: Abnormal   Collection Time: 09/06/20  2:54 PM  Result Value Ref Range   Retic Ct Pct 2.5 0.4 - 3.1 %   RBC. 4.05 (L) 4.22 - 5.81 MIL/uL   Retic Count, Absolute 99.5 19.0 - 186.0 K/uL   Immature Retic Fract 21.5 (H) 2.3 - 15.9 %    Comment: Performed at [facility] Lab at [facility], [address], [place], [place] [address]  CBC with Differential (Cancer Center Only)     Status: Abnormal   Collection Time: 09/06/20  2:54 PM  Result Value Ref Range   WBC Count 6.7 4.0 - 10.5 K/uL   RBC 3.97 (L) 4.22 - 5.81 MIL/uL   Hemoglobin 10.5 (L) 13.0 -  17.0 g/dL   HCT 33.5 (L) 39.0 - 52.0 %   MCV 84.4 80.0 - 100.0 fL   MCH 26.4 26.0 - 34.0 pg    MCHC 31.3 30.0 - 36.0 g/dL   RDW 13.9 11.5 - 15.5 %   Platelet Count 205 150 - 400 K/uL   nRBC 0.0 0.0 - 0.2 %   Neutrophils Relative % 56 %   Neutro Abs 3.8 1.7 - 7.7 K/uL   Lymphocytes Relative 34 %   Lymphs Abs 2.3 0.7 - 4.0 K/uL   Monocytes Relative 6 %   Monocytes Absolute 0.4 0.1 - 1.0 K/uL   Eosinophils Relative 2 %   Eosinophils Absolute 0.2 0.0 - 0.5 K/uL   Basophils Relative 1 %   Basophils Absolute 0.0 0.0 - 0.1 K/uL   Immature Granulocytes 1 %   Abs Immature Granulocytes 0.07 0.00 - 0.07 K/uL    Comment: Performed at [facility] Lab at [facility], [address] [place], [place] [address]      RADIOGRAPHY: No results found.     PATHOLOGY: None   ASSESSMENT/PLAN: Mr. Princeamir Asad. is a very pleasant 60 yo Filipino gentleman with recent diagnosis of iron deficiency secondary to acute GI blood loss.  His iron studies remain quite low so we will get him set up for IV iron.  He can stop taking his oral iron supplement at this time.  Follow-up in 6 weeks.   All questions were answered. The patient knows to call the clinic with any problems, questions or concerns. We can certainly see the patient much sooner if necessary.  The patient was discussed with Dr. Marin Olp and he is in agreement with the aforementioned.   Laverna Peace, NP

## 2020-09-07 ENCOUNTER — Telehealth: Payer: Self-pay | Admitting: *Deleted

## 2020-09-07 LAB — IRON AND TIBC
Iron: 18 ug/dL — ABNORMAL LOW (ref 42–163)
Saturation Ratios: 4 % — ABNORMAL LOW (ref 20–55)
TIBC: 431 ug/dL — ABNORMAL HIGH (ref 202–409)
UIBC: 413 ug/dL — ABNORMAL HIGH (ref 117–376)

## 2020-09-07 LAB — FERRITIN: Ferritin: 21 ng/mL — ABNORMAL LOW (ref 24–336)

## 2020-09-07 LAB — ERYTHROPOIETIN: Erythropoietin: 69 m[IU]/mL — ABNORMAL HIGH (ref 2.6–18.5)

## 2020-09-07 NOTE — Telephone Encounter (Signed)
No 09/06/20 LOS

## 2020-09-08 ENCOUNTER — Encounter: Payer: Self-pay | Admitting: Family

## 2020-09-08 ENCOUNTER — Ambulatory Visit: Payer: Managed Care, Other (non HMO) | Admitting: Physical Therapy

## 2020-09-08 ENCOUNTER — Other Ambulatory Visit: Payer: Self-pay

## 2020-09-08 ENCOUNTER — Encounter: Payer: Self-pay | Admitting: Physical Therapy

## 2020-09-08 ENCOUNTER — Telehealth: Payer: Self-pay | Admitting: *Deleted

## 2020-09-08 ENCOUNTER — Inpatient Hospital Stay: Payer: Managed Care, Other (non HMO)

## 2020-09-08 VITALS — BP 113/73 | HR 82 | Temp 98.5°F | Resp 17 | Ht 68.0 in | Wt 186.0 lb

## 2020-09-08 DIAGNOSIS — M542 Cervicalgia: Secondary | ICD-10-CM

## 2020-09-08 DIAGNOSIS — M25612 Stiffness of left shoulder, not elsewhere classified: Secondary | ICD-10-CM

## 2020-09-08 DIAGNOSIS — D500 Iron deficiency anemia secondary to blood loss (chronic): Secondary | ICD-10-CM | POA: Diagnosis not present

## 2020-09-08 DIAGNOSIS — R252 Cramp and spasm: Secondary | ICD-10-CM

## 2020-09-08 DIAGNOSIS — G8929 Other chronic pain: Secondary | ICD-10-CM

## 2020-09-08 MED ORDER — SODIUM CHLORIDE 0.9 % IV SOLN
125.0000 mg | Freq: Once | INTRAVENOUS | Status: AC
  Administered 2020-09-08: 125 mg via INTRAVENOUS
  Filled 2020-09-08: qty 125

## 2020-09-08 MED ORDER — SODIUM CHLORIDE 0.9% FLUSH
10.0000 mL | Freq: Once | INTRAVENOUS | Status: DC | PRN
  Filled 2020-09-08: qty 10

## 2020-09-08 MED ORDER — SODIUM CHLORIDE 0.9 % IV SOLN
Freq: Once | INTRAVENOUS | Status: AC
  Filled 2020-09-08: qty 250

## 2020-09-08 MED ORDER — SODIUM CHLORIDE 0.9% FLUSH
3.0000 mL | Freq: Once | INTRAVENOUS | Status: DC | PRN
  Filled 2020-09-08: qty 10

## 2020-09-08 NOTE — Telephone Encounter (Signed)
Per entered late 09/06/20 los and scheduling message from Judson Roch 09/08/20 - called and gave upcoming appointments - (2) doses of IV Iron - patient confirmed.

## 2020-09-08 NOTE — Patient Instructions (Signed)
Sodium Ferric Gluconate Complex injection What is this medication? SODIUM FERRIC GLUCONATE COMPLEX (SOE dee um FER ik GLOO koe nate KOM pleks) is an iron replacement. It is used with epoetin therapy to treat low iron levelsin patients who are receiving hemodialysis. This medicine may be used for other purposes; ask your health care provider orpharmacist if you have questions. COMMON BRAND NAME(S): Ferrlecit, Nulecit What should I tell my care team before I take this medication? They need to know if you have any of the following conditions: anemia that is not from iron deficiency high levels of iron in the body an unusual or allergic reaction to iron, benzyl alcohol, other medicines, foods, dyes, or preservatives pregnant or are trying to become pregnant breast-feeding How should I use this medication? This medicine is for infusion into a vein. It is given by a health careprofessional in a hospital or clinic setting. Talk to your pediatrician regarding the use of this medicine in children. While this drug may be prescribed for children as young as 6 years old for selectedconditions, precautions do apply. Overdosage: If you think you have taken too much of this medicine contact apoison control center or emergency room at once. NOTE: This medicine is only for you. Do not share this medicine with others. What if I miss a dose? It is important not to miss your dose. Call your doctor or health careprofessional if you are unable to keep an appointment. What may interact with this medication? Do not take this medicine with any of the following medications: deferoxamine dimercaprol other iron products This medicine may also interact with the following medications: chloramphenicol deferasirox medicine for blood pressure like enalapril This list may not describe all possible interactions. Give your health care provider a list of all the medicines, herbs, non-prescription drugs, or dietary  supplements you use. Also tell them if you smoke, drink alcohol, or use illegaldrugs. Some items may interact with your medicine. What should I watch for while using this medication? Your condition will be monitored carefully while you are receiving thismedicine. Visit your doctor for check-ups as directed. What side effects may I notice from receiving this medication? Side effects that you should report to your doctor or health care professionalas soon as possible: allergic reactions like skin rash, itching or hives, swelling of the face, lips, or tongue breathing problems changes in hearing changes in vision chills, flushing, or sweating fast, irregular heartbeat feeling faint or lightheaded, falls fever, flu-like symptoms high or low blood pressure pain, tingling, numbness in the hands or feet severe pain in the chest, back, flanks, or groin swelling of the ankles, feet, hands trouble passing urine or change in the amount of urine unusually weak or tired Side effects that usually do not require medical attention (report to yourdoctor or health care professional if they continue or are bothersome): cramps dark colored stools diarrhea headache nausea, vomiting stomach upset This list may not describe all possible side effects. Call your doctor for medical advice about side effects. You may report side effects to FDA at1-800-FDA-1088. Where should I keep my medication? This drug is given in a hospital or clinic and will not be stored at home. NOTE: This sheet is a summary. It may not cover all possible information. If you have questions about this medicine, talk to your doctor, pharmacist, orhealth care provider.  2022 Elsevier/Gold Standard (2007-10-08 15:58:57)  

## 2020-09-08 NOTE — Therapy (Signed)
[address] [place], [place], [address] Phone: [phone]   Fax:  [phone]  Physical Therapy Treatment  Patient Details  Name: Colton Gower [facility]. MRN: [id number] Date of Birth: 07/02/1960 Referring Provider (PT): Buena Irish   Encounter Date: 09/08/2020   PT End of Session - 09/08/20 1251     Visit Number 2    Date for PT Re-Evaluation 11/25/20    PT Start Time 1217    PT Stop Time 1300    PT Time Calculation (min) 43 min    Activity Tolerance Patient tolerated treatment well    Behavior During Therapy [facility] for tasks assessed/performed             Past Medical History:  Diagnosis Date   CAD (coronary artery disease) of artery bypass graft 02/2020   Diabetes mellitus without complication ([place])    GI bleed    Hyperlipidemia LDL goal <70    Hypertension    PAF (paroxysmal atrial fibrillation) ([place])     Past Surgical History:  Procedure Laterality Date   BIOPSY  08/17/2020   Procedure: BIOPSY;  Surgeon: Mauri Pole, MD;  Location: [place];  Service: Endoscopy;;   COLONOSCOPY WITH PROPOFOL Left 02/27/2020   Procedure: COLONOSCOPY WITH PROPOFOL;  Surgeon: Otis Brace, MD;  [facility];  Service: Gastroenterology;  Laterality: Left;   COLONOSCOPY WITH PROPOFOL N/A 08/13/2020   Procedure: COLONOSCOPY WITH PROPOFOL;  Surgeon: Gatha Mayer, MD;  Location: [facility]COPY;  Service: Endoscopy;  Laterality: N/A;   CORONARY ARTERY BYPASS GRAFT N/A 03/08/2020   Procedure: CORONARY ARTERY BYPASS GRAFTING (CABG) x 5 ON PUMP;  Surgeon: Wonda Olds, MD;  [facility];  Service: Open Heart Surgery;  Laterality: N/A;   ENDOVEIN HARVEST OF GREATER SAPHENOUS VEIN Right 03/08/2020   Procedure: ENDOVEIN HARVEST OF GREATER SAPHENOUS VEIN;  Surgeon: Wonda Olds, MD;  Location: [place];  Service: Open Heart Surgery;  Laterality: Right;   ESOPHAGOGASTRODUODENOSCOPY (EGD) WITH PROPOFOL  N/A 02/27/2020   Procedure: ESOPHAGOGASTRODUODENOSCOPY (EGD) WITH PROPOFOL;  Surgeon: Otis Brace, MD;  [facility];  Service: Gastroenterology;  Laterality: N/A;   ESOPHAGOGASTRODUODENOSCOPY (EGD) WITH PROPOFOL N/A 08/17/2020   Procedure: ESOPHAGOGASTRODUODENOSCOPY (EGD) WITH PROPOFOL;  Surgeon: Mauri Pole, MD;  Location: [place] ENDOSCOPY;  Service: Endoscopy;  Laterality: N/A;   LEFT HEART CATH AND CORONARY ANGIOGRAPHY N/A 03/04/2020   Procedure: LEFT HEART CATH AND CORONARY ANGIOGRAPHY;  Surgeon: Burnell Blanks, MD;  Location: [place] CV LAB;  Service: Cardiovascular;  Laterality: N/A;   RADIAL ARTERY HARVEST Left 03/08/2020   Procedure: RADIAL ARTERY HARVEST;  Surgeon: Wonda Olds, MD;  Location: [place];  Service: Open Heart Surgery;  Laterality: Left;   TEE WITHOUT CARDIOVERSION N/A 03/08/2020   Procedure: TRANSESOPHAGEAL ECHOCARDIOGRAM (TEE);  Surgeon: Wonda Olds, MD;  Location: [place];  Service: Open Heart Surgery;  Laterality: N/A;    There were no vitals filed for this visit.   Subjective Assessment - 09/08/20 1220     Subjective Pt reports doing well today, no new changes, mild pain in L shoulder    Currently in Pain? Yes    Pain Score 4     Pain Location Shoulder    Pain Orientation Left                               [facility] Adult PT Treatment/Exercise - 09/08/20  0001       Exercises   Exercises Shoulder      Shoulder Exercises: Supine   Flexion AAROM;Both;20 reps    Shoulder Flexion Weight (lbs) 1    Flexion Limitations with weight bar    ABduction AAROM;Left;20 reps    Shoulder ABduction Weight (lbs) 1    ABduction Limitations with weight bar      Shoulder Exercises: Standing   Other Standing Exercises wall ladder flex/ext x10      Shoulder Exercises: Pulleys   Flexion 2 minutes    ABduction 2 minutes      Shoulder Exercises: ROM/Strengthening   Nustep L5 x 6 min LE/UE      Manual Therapy   Manual  Therapy Joint mobilization;Passive ROM    Joint Mobilization post and inf mobs grade III/IV L shoulder for increased shoulder abd/flex    Passive ROM PROM to L shoulder all directions                      PT Short Term Goals - 09/02/20 1143       PT SHORT TERM GOAL #1   Title Pt will be I with initial HEP    Time 2    Period Weeks    Status New    Target Date 09/16/20               PT Long Term Goals - 09/02/20 1144       PT LONG TERM GOAL #1   Title Pt will be I with advanced HEP    Time 8    Period Weeks    Status New    Target Date 10/28/20      PT LONG TERM GOAL #2   Title Pt will demo L shoulder flexion >/= 145 deg    Baseline 90    Time 8    Period Weeks    Status New    Target Date 10/28/20      PT LONG TERM GOAL #3   Title Pt will demo L shoulder abduction >/= 120 deg    Baseline 70    Time 8    Period Weeks    Status New    Target Date 10/28/20      PT LONG TERM GOAL #4   Title Pt will report 50% reduction in L shoulder pain    Time 8    Period Weeks    Status New    Target Date 10/28/20                   Plan - 09/08/20 1251     Clinical Impression Statement Pt tolerated progression to TE well. Cues to avoid compensations esp with L shoulder abduction AAROM ex's. Increased pain at end ranges of L shoulder ROM. Grade III/IV mobs to L shoulder to increase shoulder ROM. Muscle spasms and guarding at end ranges with manual tx. Continue to progress to tolerance.    PT Treatment/Interventions ADLs/Self Care Home Management;Electrical Stimulation;Iontophoresis '4mg'$ /ml Dexamethasone;Moist Heat;Therapeutic activities;Therapeutic exercise;Manual techniques;Passive range of motion;Dry needling    PT Next Visit Plan review/progress HEP, shoulder ROM/mobilizations, cervical ex's/manual to cervical, pt interested in DN    Consulted and Agree with Plan of Care Patient             Patient will benefit from skilled therapeutic  intervention in order to improve the following deficits and impairments:  Decreased range of motion, Increased muscle spasms, Impaired UE functional  use, Pain, Hypomobility, Impaired flexibility, Decreased strength, Postural dysfunction  Visit Diagnosis: Chronic left shoulder pain  Stiffness of left shoulder, not elsewhere classified  Cramp and spasm  Cervicalgia     Problem List Patient Active Problem List   Diagnosis Date Noted   Iron deficiency anemia due to chronic blood loss 08/26/2020   Deficiency anemia 08/26/2020   PAF (paroxysmal atrial fibrillation) ([place]) 08/12/2020   Sinus tachycardia    Vitamin B12 deficiency anemia due to intrinsic factor deficiency 05/19/2020   Primary osteoarthritis involving multiple joints 05/19/2020   Dyslipidemia, goal LDL below 70 05/18/2020   T2DM (type 2 diabetes mellitus) ([place]) 02/22/2020   Hypertension    Gout    Amador Cunas, PT, DPT [facility]se [facility]astine 09/08/2020, 1:01 PM  [address]. [place], [place], [address] Phone: [phone]   Fax:  [phone]  Name: Colton Guzman [facility] MRN: [id number] Date of Birth: 1960-05-31

## 2020-09-14 ENCOUNTER — Ambulatory Visit: Payer: Managed Care, Other (non HMO) | Admitting: Physical Therapy

## 2020-09-15 ENCOUNTER — Inpatient Hospital Stay: Payer: Managed Care, Other (non HMO)

## 2020-09-15 ENCOUNTER — Other Ambulatory Visit: Payer: Self-pay

## 2020-09-15 VITALS — BP 118/70 | HR 83 | Temp 98.8°F | Resp 17

## 2020-09-15 DIAGNOSIS — D500 Iron deficiency anemia secondary to blood loss (chronic): Secondary | ICD-10-CM | POA: Diagnosis not present

## 2020-09-15 MED ORDER — SODIUM CHLORIDE 0.9 % IV SOLN
Freq: Once | INTRAVENOUS | Status: AC
  Filled 2020-09-15: qty 250

## 2020-09-15 MED ORDER — SODIUM CHLORIDE 0.9 % IV SOLN
125.0000 mg | Freq: Once | INTRAVENOUS | Status: AC
  Administered 2020-09-15: 125 mg via INTRAVENOUS
  Filled 2020-09-15: qty 125

## 2020-09-15 NOTE — Patient Instructions (Signed)

## 2020-09-16 ENCOUNTER — Ambulatory Visit: Payer: Managed Care, Other (non HMO) | Admitting: Physical Therapy

## 2020-09-16 ENCOUNTER — Encounter: Payer: Self-pay | Admitting: Physical Therapy

## 2020-09-16 DIAGNOSIS — R252 Cramp and spasm: Secondary | ICD-10-CM

## 2020-09-16 DIAGNOSIS — D500 Iron deficiency anemia secondary to blood loss (chronic): Secondary | ICD-10-CM | POA: Diagnosis not present

## 2020-09-16 DIAGNOSIS — M25512 Pain in left shoulder: Secondary | ICD-10-CM

## 2020-09-16 DIAGNOSIS — M25612 Stiffness of left shoulder, not elsewhere classified: Secondary | ICD-10-CM

## 2020-09-16 DIAGNOSIS — G8929 Other chronic pain: Secondary | ICD-10-CM

## 2020-09-16 NOTE — Therapy (Signed)
[address]. [place], [place], [address] Phone: [phone]   Fax:  [phone]  Physical Therapy Treatment  Patient Details  Name: Colton Guzman. MRN: [id number] Date of Birth: 09-Nov-1960 Referring Provider (PT): Buena Irish   Encounter Date: 09/16/2020   PT End of Session - 09/16/20 1056     Visit Number 3    Date for PT Re-Evaluation 11/25/20    PT Start Time 8828    PT Stop Time 1056    PT Time Calculation (min) 41 min    Activity Tolerance Patient tolerated treatment well    Behavior During Therapy [facility] for tasks assessed/performed             Past Medical History:  Diagnosis Date   CAD (coronary artery disease) of artery bypass graft 02/2020   Diabetes mellitus without complication ([place])    GI bleed    Hyperlipidemia LDL goal <70    Hypertension    PAF (paroxysmal atrial fibrillation) ([place])     Past Surgical History:  Procedure Laterality Date   BIOPSY  08/17/2020   Procedure: BIOPSY;  Surgeon: Mauri Pole, MD;  Location: [place];  Service: Endoscopy;;   COLONOSCOPY WITH PROPOFOL Left 02/27/2020   Procedure: COLONOSCOPY WITH PROPOFOL;  Surgeon: Otis Brace, MD;  [facility];  Service: Gastroenterology;  Laterality: Left;   COLONOSCOPY WITH PROPOFOL N/A 08/13/2020   Procedure: COLONOSCOPY WITH PROPOFOL;  Surgeon: Gatha Mayer, MD;  [facility];  Service: Endoscopy;  Laterality: N/A;   CORONARY ARTERY BYPASS GRAFT N/A 03/08/2020   Procedure: CORONARY ARTERY BYPASS GRAFTING (CABG) x 5 ON PUMP;  Surgeon: Wonda Olds, MD;  [facility];  Service: Open Heart Surgery;  Laterality: N/A;   ENDOVEIN HARVEST OF GREATER SAPHENOUS VEIN Right 03/08/2020   Procedure: ENDOVEIN HARVEST OF GREATER SAPHENOUS VEIN;  Surgeon: Wonda Olds, MD;  Location: [place];  Service: Open Heart Surgery;  Laterality: Right;   ESOPHAGOGASTRODUODENOSCOPY (EGD) WITH PROPOFOL  N/A 02/27/2020   Procedure: ESOPHAGOGASTRODUODENOSCOPY (EGD) WITH PROPOFOL;  Surgeon: Otis Brace, MD;  [facility];  Service: Gastroenterology;  Laterality: N/A;   ESOPHAGOGASTRODUODENOSCOPY (EGD) WITH PROPOFOL N/A 08/17/2020   Procedure: ESOPHAGOGASTRODUODENOSCOPY (EGD) WITH PROPOFOL;  Surgeon: Mauri Pole, MD;  Location: [place] ENDOSCOPY;  Service: Endoscopy;  Laterality: N/A;   LEFT HEART CATH AND CORONARY ANGIOGRAPHY N/A 03/04/2020   Procedure: LEFT HEART CATH AND CORONARY ANGIOGRAPHY;  Surgeon: Burnell Blanks, MD;  Location: [place] CV LAB;  Service: Cardiovascular;  Laterality: N/A;   RADIAL ARTERY HARVEST Left 03/08/2020   Procedure: RADIAL ARTERY HARVEST;  Surgeon: Wonda Olds, MD;  Location: [place];  Service: Open Heart Surgery;  Laterality: Left;   TEE WITHOUT CARDIOVERSION N/A 03/08/2020   Procedure: TRANSESOPHAGEAL ECHOCARDIOGRAM (TEE);  Surgeon: Wonda Olds, MD;  Location: [place];  Service: Open Heart Surgery;  Laterality: N/A;    There were no vitals filed for this visit.   Subjective Assessment - 09/16/20 1016     Subjective Some stiffness in the shoulder    Currently in Pain? Yes    Pain Score 4     Pain Location Shoulder    Pain Orientation Left                               [facility] Adult PT Treatment/Exercise - 09/16/20 0001       Shoulder  Exercises: Standing   Extension Strengthening;Both;20 reps;Weights    Extension Weight (lbs) 5    Other Standing Exercises Cabinet reaches & finger ladder  flex &abd x5 each    Other Standing Exercises AAROM flex, Ext, IR up back x 10 each      Shoulder Exercises: ROM/Strengthening   UBE (Upper Arm Bike) L1.7 x 3 min each    Other ROM/Strengthening Exercises Rows & Lats 20lb 2x10      Manual Therapy   Manual Therapy Joint mobilization;Passive ROM;Soft tissue mobilization    Joint Mobilization post and inf mobs grade III/IV L shoulder for increased shoulder abd/flex     Soft tissue mobilization L pec & deltoid    Passive ROM PROM to L shoulder all directions                      PT Short Term Goals - 09/16/20 1057       PT SHORT TERM GOAL #1   Title Pt will be I with initial HEP    Status Partially Met               PT Long Term Goals - 09/02/20 1144       PT LONG TERM GOAL #1   Title Pt will be I with advanced HEP    Time 8    Period Weeks    Status New    Target Date 10/28/20      PT LONG TERM GOAL #2   Title Pt will demo L shoulder flexion >/= 145 deg    Baseline 90    Time 8    Period Weeks    Status New    Target Date 10/28/20      PT LONG TERM GOAL #3   Title Pt will demo L shoulder abduction >/= 120 deg    Baseline 70    Time 8    Period Weeks    Status New    Target Date 10/28/20      PT LONG TERM GOAL #4   Title Pt will report 50% reduction in L shoulder pain    Time 8    Period Weeks    Status New    Target Date 10/28/20                   Plan - 09/16/20 1057     Clinical Impression Statement Pt has decrease L shoulder ROM causing postural compensation with interventions.  Some difficulty noted with seated lat pull downs. L shoulder tightness noted with PROM. Pt has a very tight L pec and is very limited with external rotation. Some end range L shoulder pain also present with PROM.    Personal Factors and Comorbidities Comorbidity 2    Comorbidities CAD, DM Type II    Examination-Activity Limitations Lift;Reach Overhead    Examination-Participation Restrictions Community Activity;Interpersonal Relationship;Occupation    Stability/Clinical Decision Making Stable/Uncomplicated    Rehab Potential Good    PT Frequency 2x / week    PT Duration 8 weeks    PT Treatment/Interventions ADLs/Self Care Home Management;Electrical Stimulation;Iontophoresis 4mg /ml Dexamethasone;Moist Heat;Therapeutic activities;Therapeutic exercise;Manual techniques;Passive range of motion;Dry needling    PT Next  Visit Plan shoulder ROM/mobilizations, cervical ex's/manual to cervical, pt interested in DN             Patient will benefit from skilled therapeutic intervention in order to improve the following deficits and impairments:  Decreased range of motion, Increased muscle spasms,  Impaired UE functional use, Pain, Hypomobility, Impaired flexibility, Decreased strength, Postural dysfunction  Visit Diagnosis: Chronic left shoulder pain  Stiffness of left shoulder, not elsewhere classified  Cramp and spasm     Problem List Patient Active Problem List   Diagnosis Date Noted   Iron deficiency anemia due to chronic blood loss 08/26/2020   Deficiency anemia 08/26/2020   PAF (paroxysmal atrial fibrillation) ([place]) 08/12/2020   Sinus tachycardia    Vitamin B12 deficiency anemia due to intrinsic factor deficiency 05/19/2020   Primary osteoarthritis involving multiple joints 05/19/2020   Dyslipidemia, goal LDL below 70 05/18/2020   T2DM (type 2 diabetes mellitus) ([place]) 02/22/2020   Hypertension    Gout     Scot Jun 09/16/2020, 11:01 AM  [address]. [place], [place], [address] Phone: [phone]   Fax:  [phone]  Name: Colton Guzman [facility]. MRN: [id number] Date of Birth: 12/24/1960

## 2020-09-18 ENCOUNTER — Encounter: Payer: Self-pay | Admitting: Internal Medicine

## 2020-09-19 ENCOUNTER — Other Ambulatory Visit: Payer: Self-pay | Admitting: Internal Medicine

## 2020-09-19 DIAGNOSIS — K279 Peptic ulcer, site unspecified, unspecified as acute or chronic, without hemorrhage or perforation: Secondary | ICD-10-CM

## 2020-09-21 ENCOUNTER — Other Ambulatory Visit: Payer: Self-pay

## 2020-09-21 ENCOUNTER — Encounter: Payer: Self-pay | Admitting: Physical Therapy

## 2020-09-21 ENCOUNTER — Ambulatory Visit: Payer: Managed Care, Other (non HMO) | Attending: Family Medicine | Admitting: Physical Therapy

## 2020-09-21 DIAGNOSIS — M542 Cervicalgia: Secondary | ICD-10-CM | POA: Diagnosis present

## 2020-09-21 DIAGNOSIS — M25612 Stiffness of left shoulder, not elsewhere classified: Secondary | ICD-10-CM | POA: Diagnosis present

## 2020-09-21 DIAGNOSIS — G8929 Other chronic pain: Secondary | ICD-10-CM | POA: Insufficient documentation

## 2020-09-21 DIAGNOSIS — M25512 Pain in left shoulder: Secondary | ICD-10-CM | POA: Diagnosis not present

## 2020-09-21 DIAGNOSIS — R252 Cramp and spasm: Secondary | ICD-10-CM | POA: Insufficient documentation

## 2020-09-21 NOTE — Patient Instructions (Signed)
Access Code: [email or web address] URL: [email or web address] Date: 09/21/2020 Prepared by: Grayling Congress  Exercises Seated Shoulder Flexion AAROM with Pulley Behind - 2 x daily - 7 x weekly - 2 sets - 3 min hold Seated Shoulder Abduction AAROM with Pulley Behind - 2 x daily - 7 x weekly - 2 sets - 3 min hold Supine Shoulder Flexion with Free Weight - 2 x daily - 7 x weekly - 2 sets - 10 reps Sidelying Shoulder Abduction Palm Forward - 2 x daily - 7 x weekly - 2 sets - 10 reps Supine Shoulder External and Internal Rotation in Abduction with Dumbbell - 2 x daily - 7 x weekly - 2 sets - 10 reps Supine Shoulder Internal Rotation Stretch - 2 x daily - 7 x weekly - 2 sets - 10 reps

## 2020-09-21 NOTE — Therapy (Signed)
[facility] [address] [place], [place], [address] Phone: [phone]   Fax:  [phone]  Physical Therapy Treatment  Patient Details  Name: Colton Guzman [facility]. MRN: [id number] Date of Birth: 25-Jul-1960 Referring Provider (PT): Alfredo Bach   Encounter Date: 09/21/2020   PT End of Session - 09/21/20 1053     Visit Number 4    Date for PT Re-Evaluation 11/25/20    PT Start Time 1016    PT Stop Time 1056    PT Time Calculation (min) 40 min    Activity Tolerance Patient tolerated treatment well    Behavior During Therapy [facility] for tasks assessed/performed             Past Medical History:  Diagnosis Date   CAD (coronary artery disease) of artery bypass graft 02/2020   Diabetes mellitus without complication (HCC)    GI bleed    Hyperlipidemia LDL goal <70    Hypertension    PAF (paroxysmal atrial fibrillation) (HCC)     Past Surgical History:  Procedure Laterality Date   BIOPSY  08/17/2020   Procedure: BIOPSY;  Surgeon: Napoleon Form, MD;  [facility];  Service: Endoscopy;;   COLONOSCOPY WITH PROPOFOL Left 02/27/2020   Procedure: COLONOSCOPY WITH PROPOFOL;  Surgeon: Kathi Der, MD;  [facility]: [facility]OS[facility];  Service: Gastroenterology;  Laterality: Left;   COLONOSCOPY WITH PROPOFOL N/A 08/13/2020   Procedure: COLONOSCOPY WITH PROPOFOL;  Surgeon: Iva Boop, MD;  Location: [facility] ENDOSCOPY;  Service: Endoscopy;  Laterality: N/A;   CORONARY ARTERY BYPASS GRAFT N/A 03/08/2020   Procedure: CORONARY ARTERY BYPASS GRAFTING (CABG) x 5 ON PUMP;  Surgeon: Linden Dolin, MD;  Location: [facility] OR;  Service: Open Heart Surgery;  Laterality: N/A;   ENDOVEIN HARVEST OF GREATER SAPHENOUS VEIN Right 03/08/2020   Procedure: ENDOVEIN HARVEST OF GREATER SAPHENOUS VEIN;  Surgeon: Linden Dolin, MD;  [facility]: [facility] OR;  Service: Open Heart Surgery;  Laterality: Right;   ESOPHAGOGASTRODUODENOSCOPY (EGD) WITH PROPOFOL N/A  02/27/2020   Procedure: ESOPHAGOGASTRODUODENOSCOPY (EGD) WITH PROPOFOL;  Surgeon: Kathi Der, MD;  [facility];  Service: Gastroenterology;  Laterality: N/A;   ESOPHAGOGASTRODUODENOSCOPY (EGD) WITH PROPOFOL N/A 08/17/2020   Procedure: ESOPHAGOGASTRODUODENOSCOPY (EGD) WITH PROPOFOL;  Surgeon: Napoleon Form, MD;  [facility];  Service: Endoscopy;  Laterality: N/A;   LEFT HEART CATH AND CORONARY ANGIOGRAPHY N/A 03/04/2020   Procedure: LEFT HEART CATH AND CORONARY ANGIOGRAPHY;  Surgeon: [name], MD;  [facility] INVASIVE CV LAB;  Service: Cardiovascular;  Laterality: N/A;   RADIAL ARTERY HARVEST Left 03/08/2020   Procedure: RADIAL ARTERY HARVEST;  Surgeon: Linden Dolin, MD;  [facility] OR;  Service: Open Heart Surgery;  Laterality: Left;   TEE WITHOUT CARDIOVERSION N/A 03/08/2020   Procedure: TRANSESOPHAGEAL ECHOCARDIOGRAM (TEE);  Surgeon: Linden Dolin, MD;  [facility] OR;  Service: Open Heart Surgery;  Laterality: N/A;    There were no vitals filed for this visit.   Subjective Assessment - 09/21/20 1020     Subjective Not really much new- shoulder is still hurting.    Pertinent History CAD, Type II DM    Diagnostic tests xray and [place] L shoulder    Patient Stated Goals increase shoulder ROM and decrease pain    Currently in Pain? Yes    Pain Score 5     Pain Location Shoulder    Pain Orientation Left    Pain Descriptors / Indicators Aching  Pain Type Chronic pain                               [facility] Adult PT Treatment/Exercise - 09/21/20 0001       Shoulder Exercises: Supine   Flexion AROM;Left;5 reps    Shoulder Flexion Weight (lbs) 2    Flexion Limitations focusing on overhead      Shoulder Exercises: Sidelying   Internal Rotation AROM;Left;5 reps    Internal Rotation Limitations R shoulder IR/ER AROM with 2# focusing on stretch    ABduction AROM;Left;5 reps    ABduction Weight (lbs) 2    ABduction  Limitations focusing on stretching overhead      Shoulder Exercises: Standing   Other Standing Exercises L shoulder IR stretch 5x5" to tolerance   cues to avoid pushing into excessive pain     Shoulder Exercises: Pulleys   Flexion 2 minutes    Flexion Limitations to tolerance    ABduction 2 minutes    ABduction Limitations to tolerance      Shoulder Exercises: ROM/Strengthening   UBE (Upper Arm Bike) L2.0 x 3 min each      Manual Therapy   Manual Therapy Joint mobilization;Passive ROM;Soft tissue mobilization;Muscle Energy Technique    Joint Mobilization L GH AP, PA, inferior jt mobs grade III/IV    Passive ROM PROM to L shoulder all directions to tolerance    Muscle Energy Technique L shoulder IR MET 5x5"  followed by passive ER stretching 5x15", L shoulder ER MET 5x5"  followed by passive IR stretching 5x15",                    PT Education - 09/21/20 1053     Education Details update to HEP; edu on benefits of over the door pulley    Person(s) Educated Patient    Methods Explanation;Demonstration;Tactile cues;Verbal cues;Handout    Comprehension Verbalized understanding;Returned demonstration              PT Short Term Goals - 09/16/20 1057       PT SHORT TERM GOAL #1   Title Pt will be I with initial HEP    Status Partially Met               PT Long Term Goals - 09/21/20 1056       PT LONG TERM GOAL #1   Title Pt will be I with advanced HEP    Time 8    Period Weeks    Status On-going      PT LONG TERM GOAL #2   Title Pt will demo L shoulder flexion >/= 145 deg    Baseline 90    Time 8    Period Weeks    Status On-going      PT LONG TERM GOAL #3   Title Pt will demo L shoulder abduction >/= 120 deg    Baseline 70    Time 8    Period Weeks    Status On-going      PT LONG TERM GOAL #4   Title Pt will report 50% reduction in L shoulder pain    Time 8    Period Weeks    Status On-going                   Plan - 09/21/20  1055     Clinical Impression Statement Patient arrived to session without new  complaints. Tolerated L shoulder joint mobs and PROM in all directions and with good response to MET today. Patient remarked on improved motion/stiffness after MT. Worked on mat ther-ex with use of weighted resistance to assist with ROM. Also educated patient on use of over the door pulley for improved ROM at home.  Patient reported understanding of HEP update with exercises that were well-tolerated today. No complaints at end of session.    Personal Factors and Comorbidities Comorbidity 2    Comorbidities CAD, DM Type II    Examination-Activity Limitations Lift;Reach Overhead    Examination-Participation Restrictions Community Activity;Interpersonal Relationship;Occupation    Stability/Clinical Decision Making Stable/Uncomplicated    Rehab Potential Good    PT Frequency 2x / week    PT Duration 8 weeks    PT Treatment/Interventions ADLs/Self Care Home Management;Electrical Stimulation;Iontophoresis 4mg /ml Dexamethasone;Moist Heat;Therapeutic activities;Therapeutic exercise;Manual techniques;Passive range of motion;Dry needling    PT Next Visit Plan shoulder ROM/mobilizations, cervical ex's/manual to cervical, pt interested in DN    Consulted and Agree with Plan of Care Patient             Patient will benefit from skilled therapeutic intervention in order to improve the following deficits and impairments:  Decreased range of motion, Increased muscle spasms, Impaired UE functional use, Pain, Hypomobility, Impaired flexibility, Decreased strength, Postural dysfunction  Visit Diagnosis: Chronic left shoulder pain  Stiffness of left shoulder, not elsewhere classified  Cramp and spasm  Cervicalgia     Problem List Patient Active Problem List   Diagnosis Date Noted   Iron deficiency anemia due to chronic blood loss 08/26/2020   Deficiency anemia 08/26/2020   PAF (paroxysmal atrial fibrillation) ([place])  08/12/2020   Sinus tachycardia    Vitamin B12 deficiency anemia due to intrinsic factor deficiency 05/19/2020   Primary osteoarthritis involving multiple joints 05/19/2020   Dyslipidemia, goal LDL below 70 05/18/2020   T2DM (type 2 diabetes mellitus) ([place]) 02/22/2020   Hypertension    Gout      Janene Harvey, PT, DPT 09/21/20 10:57 AM    [address]. [place], [place], [address] Phone: [phone]   Fax:  [phone]  Name: Colton Guzman [facility]. MRN: [id number] Date of Birth: 1960-03-22

## 2020-09-23 ENCOUNTER — Ambulatory Visit: Payer: Managed Care, Other (non HMO) | Admitting: Physical Therapy

## 2020-09-28 ENCOUNTER — Ambulatory Visit: Payer: Managed Care, Other (non HMO) | Admitting: Physical Therapy

## 2020-09-29 ENCOUNTER — Ambulatory Visit: Payer: Managed Care, Other (non HMO)

## 2020-09-30 ENCOUNTER — Encounter: Payer: Self-pay | Admitting: Physical Therapy

## 2020-09-30 ENCOUNTER — Ambulatory Visit: Payer: Managed Care, Other (non HMO) | Admitting: Physical Therapy

## 2020-09-30 ENCOUNTER — Other Ambulatory Visit: Payer: Self-pay

## 2020-09-30 DIAGNOSIS — G8929 Other chronic pain: Secondary | ICD-10-CM

## 2020-09-30 DIAGNOSIS — R252 Cramp and spasm: Secondary | ICD-10-CM

## 2020-09-30 DIAGNOSIS — M542 Cervicalgia: Secondary | ICD-10-CM

## 2020-09-30 DIAGNOSIS — M25512 Pain in left shoulder: Secondary | ICD-10-CM | POA: Diagnosis not present

## 2020-09-30 DIAGNOSIS — M25612 Stiffness of left shoulder, not elsewhere classified: Secondary | ICD-10-CM

## 2020-09-30 NOTE — Therapy (Signed)
[place]. [place], [place], [address] Phone: [phone]   Fax:  [phone]  Physical Therapy Treatment  Patient Details  Name: Colton Guzman [facility]. MRN: [id number] Date of Birth: 03-23-1960 Referring Provider (PT): Buena Irish   Encounter Date: 09/30/2020   PT End of Session - 09/30/20 1055     Visit Number 5    Date for PT Re-Evaluation 11/25/20    PT Start Time [id number]    PT Stop Time 1055    PT Time Calculation (min) 41 min    Activity Tolerance Patient tolerated treatment well    Behavior During Therapy [facility] for tasks assessed/performed             Past Medical History:  Diagnosis Date   CAD (coronary artery disease) of artery bypass graft 02/2020   Diabetes mellitus without complication ([place])    GI bleed    Hyperlipidemia LDL goal <70    Hypertension    PAF (paroxysmal atrial fibrillation) ([place])     Past Surgical History:  Procedure Laterality Date   BIOPSY  08/17/2020   Procedure: BIOPSY;  Surgeon: Mauri Pole, MD;  Location: [place];  Service: Endoscopy;;   COLONOSCOPY WITH PROPOFOL Left 02/27/2020   Procedure: COLONOSCOPY WITH PROPOFOL;  Surgeon: Otis Brace, MD;  [facility]OS[facility];  Service: Gastroenterology;  Laterality: Left;   COLONOSCOPY WITH PROPOFOL N/A 08/13/2020   Procedure: COLONOSCOPY WITH PROPOFOL;  Surgeon: Gatha Mayer, MD;  Location: [facility] ENDOSCOPY;  Service: Endoscopy;  Laterality: N/A;   CORONARY ARTERY BYPASS GRAFT N/A 03/08/2020   Procedure: CORONARY ARTERY BYPASS GRAFTING (CABG) x 5 ON PUMP;  Surgeon: Wonda Olds, MD;  [facility] OR;  Service: Open Heart Surgery;  Laterality: N/A;   ENDOVEIN HARVEST OF GREATER SAPHENOUS VEIN Right 03/08/2020   Procedure: ENDOVEIN HARVEST OF GREATER SAPHENOUS VEIN;  Surgeon: Wonda Olds, MD;  Location: [place];  Service: Open Heart Surgery;  Laterality: Right;   ESOPHAGOGASTRODUODENOSCOPY (EGD) WITH PROPOFOL  N/A 02/27/2020   Procedure: ESOPHAGOGASTRODUODENOSCOPY (EGD) WITH PROPOFOL;  Surgeon: Otis Brace, MD;  [facility];  Service: Gastroenterology;  Laterality: N/A;   ESOPHAGOGASTRODUODENOSCOPY (EGD) WITH PROPOFOL N/A 08/17/2020   Procedure: ESOPHAGOGASTRODUODENOSCOPY (EGD) WITH PROPOFOL;  Surgeon: Mauri Pole, MD;  Location: [place] ENDOSCOPY;  Service: Endoscopy;  Laterality: N/A;   LEFT HEART CATH AND CORONARY ANGIOGRAPHY N/A 03/04/2020   Procedure: LEFT HEART CATH AND CORONARY ANGIOGRAPHY;  Surgeon: Burnell Blanks, MD;  Location: [place] CV LAB;  Service: Cardiovascular;  Laterality: N/A;   RADIAL ARTERY HARVEST Left 03/08/2020   Procedure: RADIAL ARTERY HARVEST;  Surgeon: Wonda Olds, MD;  Location: [place];  Service: Open Heart Surgery;  Laterality: Left;   TEE WITHOUT CARDIOVERSION N/A 03/08/2020   Procedure: TRANSESOPHAGEAL ECHOCARDIOGRAM (TEE);  Surgeon: Wonda Olds, MD;  Location: [place];  Service: Open Heart Surgery;  Laterality: N/A;    There were no vitals filed for this visit.   Subjective Assessment - 09/30/20 1016     Subjective Doing well. No new issues. "Feeling still but I can manage."    Pertinent History CAD, Type II DM    Diagnostic tests xray and [place] L shoulder    Patient Stated Goals increase shoulder ROM and decrease pain    Currently in Pain? Yes    Pain Score 4     Pain Location Shoulder    Pain Orientation Left    Pain Descriptors / Indicators --  stiff   Pain Type Chronic pain                               [facility] Adult PT Treatment/Exercise - 09/30/20 0001       Shoulder Exercises: Supine   External Rotation AROM;Left;10 reps    External Rotation Weight (lbs) 2    External Rotation Limitations focusing on stretching past neutral    Flexion AROM;Left;10 reps    Shoulder Flexion Weight (lbs) 2, 4    Flexion Limitations 5x 2#, 5x4#; focusing on stretch      Shoulder Exercises: Seated   Flexion  AAROM;Left;10 reps    Flexion Limitations 10x3" pball rollouts    Abduction AAROM;Left;10 reps    ABduction Limitations 10x3" scaption pball rollouts      Shoulder Exercises: Sidelying   External Rotation Strengthening;Left;10 reps    External Rotation Weight (lbs) 2    External Rotation Limitations 2x10; ROM to tolerance    ABduction AROM;Left;10 reps    ABduction Weight (lbs) 2    ABduction Limitations focusing on stretching overhead      Shoulder Exercises: Pulleys   Flexion 3 minutes    Flexion Limitations to tolerance    ABduction 3 minutes    ABduction Limitations to tolerance      Manual Therapy   Manual Therapy Joint mobilization;Passive ROM    Joint Mobilization L GH AP, PA, inferior, and distraction jt mobs grade III/IV    Passive ROM PROM to L shoulder all directions to tolerance with prolonged holds                      PT Short Term Goals - 09/30/20 1056       PT SHORT TERM GOAL #1   Title Pt will be I with initial HEP    Status Achieved               PT Long Term Goals - 09/21/20 1056       PT LONG TERM GOAL #1   Title Pt will be I with advanced HEP    Time 8    Period Weeks    Status On-going      PT LONG TERM GOAL #2   Title Pt will demo L shoulder flexion >/= 145 deg    Baseline 90    Time 8    Period Weeks    Status On-going      PT LONG TERM GOAL #3   Title Pt will demo L shoulder abduction >/= 120 deg    Baseline 70    Time 8    Period Weeks    Status On-going      PT LONG TERM GOAL #4   Title Pt will report 50% reduction in L shoulder pain    Time 8    Period Weeks    Status On-going                   Plan - 09/30/20 1056     Clinical Impression Statement Patient arrived to session without new issues. Tolerated L shoulder joint mobs and PROM to tolerance. Still demonstrating most limitation in abduction and ER. Encouraged patient to increase HEP compliance for max benefit- patient reported  understanding. Continued working on shoulder AROM with weighted resistance to encourage max stretch. Patient demonstrated difficulty achieving end range motions but demonstrated good tolerance. Patient tolerated session without excessive  pain and noted improved perceived mobility at end of session.    Personal Factors and Comorbidities Comorbidity 2    Comorbidities CAD, DM Type II    Examination-Activity Limitations Lift;Reach Overhead    Examination-Participation Restrictions Community Activity;Interpersonal Relationship;Occupation    Stability/Clinical Decision Making Stable/Uncomplicated    Rehab Potential Good    PT Frequency 2x / week    PT Duration 8 weeks    PT Treatment/Interventions ADLs/Self Care Home Management;Electrical Stimulation;Iontophoresis '4mg'$ /ml Dexamethasone;Moist Heat;Therapeutic activities;Therapeutic exercise;Manual techniques;Passive range of motion;Dry needling    PT Next Visit Plan shoulder ROM/mobilizations, cervical ex's/manual to cervical, pt interested in DN    Consulted and Agree with Plan of Care Patient             Patient will benefit from skilled therapeutic intervention in order to improve the following deficits and impairments:  Decreased range of motion, Increased muscle spasms, Impaired UE functional use, Pain, Hypomobility, Impaired flexibility, Decreased strength, Postural dysfunction  Visit Diagnosis: Chronic left shoulder pain  Stiffness of left shoulder, not elsewhere classified  Cramp and spasm  Cervicalgia     Problem List Patient Active Problem List   Diagnosis Date Noted   Iron deficiency anemia due to chronic blood loss 08/26/2020   Deficiency anemia 08/26/2020   PAF (paroxysmal atrial fibrillation) ([place]) 08/12/2020   Sinus tachycardia    Vitamin B12 deficiency anemia due to intrinsic factor deficiency 05/19/2020   Primary osteoarthritis involving multiple joints 05/19/2020   Dyslipidemia, goal LDL below 70 05/18/2020    T2DM (type 2 diabetes mellitus) ([place]) 02/22/2020   Hypertension    Gout      Janene Harvey, PT, DPT 09/30/20 10:57 AM    [address]. [place], [place], [address] Phone: [phone]   Fax:  [phone]  Name: Eyasu Jiles [facility]. MRN: [id number] Date of Birth: August 13, 1960

## 2020-10-04 ENCOUNTER — Ambulatory Visit: Payer: Managed Care, Other (non HMO) | Admitting: Physical Therapy

## 2020-10-12 ENCOUNTER — Other Ambulatory Visit (HOSPITAL_COMMUNITY): Payer: Managed Care, Other (non HMO)

## 2020-10-12 ENCOUNTER — Other Ambulatory Visit: Payer: Managed Care, Other (non HMO)

## 2020-10-18 ENCOUNTER — Inpatient Hospital Stay: Payer: Managed Care, Other (non HMO)

## 2020-10-18 ENCOUNTER — Telehealth: Payer: Self-pay

## 2020-10-18 ENCOUNTER — Other Ambulatory Visit: Payer: Self-pay

## 2020-10-18 ENCOUNTER — Inpatient Hospital Stay (HOSPITAL_BASED_OUTPATIENT_CLINIC_OR_DEPARTMENT_OTHER): Payer: Managed Care, Other (non HMO) | Admitting: Family

## 2020-10-18 ENCOUNTER — Inpatient Hospital Stay: Payer: Managed Care, Other (non HMO) | Attending: Family

## 2020-10-18 ENCOUNTER — Encounter: Payer: Self-pay | Admitting: Family

## 2020-10-18 VITALS — BP 129/68 | HR 87 | Temp 98.6°F | Resp 18 | Ht 68.0 in | Wt 188.0 lb

## 2020-10-18 DIAGNOSIS — D500 Iron deficiency anemia secondary to blood loss (chronic): Secondary | ICD-10-CM | POA: Diagnosis present

## 2020-10-18 DIAGNOSIS — K922 Gastrointestinal hemorrhage, unspecified: Secondary | ICD-10-CM | POA: Diagnosis present

## 2020-10-18 LAB — CBC WITH DIFFERENTIAL (CANCER CENTER ONLY)
Abs Immature Granulocytes: 0.03 10*3/uL (ref 0.00–0.07)
Basophils Absolute: 0 10*3/uL (ref 0.0–0.1)
Basophils Relative: 0 %
Eosinophils Absolute: 0.1 10*3/uL (ref 0.0–0.5)
Eosinophils Relative: 2 %
HCT: 36.3 % — ABNORMAL LOW (ref 39.0–52.0)
Hemoglobin: 11.1 g/dL — ABNORMAL LOW (ref 13.0–17.0)
Immature Granulocytes: 0 %
Lymphocytes Relative: 28 %
Lymphs Abs: 2.1 10*3/uL (ref 0.7–4.0)
MCH: 24.4 pg — ABNORMAL LOW (ref 26.0–34.0)
MCHC: 30.6 g/dL (ref 30.0–36.0)
MCV: 80 fL (ref 80.0–100.0)
Monocytes Absolute: 0.4 10*3/uL (ref 0.1–1.0)
Monocytes Relative: 5 %
Neutro Abs: 4.7 10*3/uL (ref 1.7–7.7)
Neutrophils Relative %: 65 %
Platelet Count: 184 10*3/uL (ref 150–400)
RBC: 4.54 MIL/uL (ref 4.22–5.81)
RDW: 15.8 % — ABNORMAL HIGH (ref 11.5–15.5)
WBC Count: 7.3 10*3/uL (ref 4.0–10.5)
nRBC: 0 % (ref 0.0–0.2)

## 2020-10-18 LAB — IRON AND TIBC
Iron: 29 ug/dL — ABNORMAL LOW (ref 42–163)
Saturation Ratios: 7 % — ABNORMAL LOW (ref 20–55)
TIBC: 416 ug/dL — ABNORMAL HIGH (ref 202–409)
UIBC: 387 ug/dL — ABNORMAL HIGH (ref 117–376)

## 2020-10-18 LAB — FERRITIN: Ferritin: 22 ng/mL — ABNORMAL LOW (ref 24–336)

## 2020-10-18 LAB — RETICULOCYTES
Immature Retic Fract: 29.3 % — ABNORMAL HIGH (ref 2.3–15.9)
RBC.: 4.46 MIL/uL (ref 4.22–5.81)
Retic Count, Absolute: 66.4 10*3/uL (ref 19.0–186.0)
Retic Ct Pct: 1.5 % (ref 0.4–3.1)

## 2020-10-18 NOTE — Progress Notes (Signed)
Hematology and Oncology Follow Up Visit  Delray Randel Rasaan Phommachanh [id number] [birth year] 60 y.o. 10/18/2020   Principle Diagnosis:  Iron deficiency anemia due to intermittent GI blood loss  Current Therapy:   IV iron as indicated    Interim History:  Mr. Carrera is here today for follow-up. He is doing well and notes improvement in his energy since receiving IV iron.  He has occasional palpitations.  He also notes dizziness when he stands too quickly which he attributes to medication.  No fever, chills, n/v, cough, rash, SOB, chest pain, abdominal pain or changes in bowel or bladder habits.  No swelling, tenderness, numbness or tingling in his extremities at this time. He has occasional stinging pain in his feet but states that this is not often.  No falls or syncope to report. He has maintained a good appetite and is staying well hydrated. His weight is stable at 188 lbs.   ECOG Performance Status: 1 - Symptomatic but completely ambulatory  Medications:  Allergies as of 10/18/2020   No Known Allergies      Medication List        Accurate as of October 18, 2020 11:01 AM. If you have any questions, ask your nurse or doctor.          ACCRUFeR 30 MG Caps Generic drug: Ferric Maltol Take 1 capsule by mouth in the morning and at bedtime.   acetaminophen 500 MG tablet Commonly known as: TYLENOL Take 1,000 mg by mouth every 4 (four) hours as needed (pain).   allopurinol 100 MG tablet Commonly known as: ZYLOPRIM TAKE 1 TABLET BY MOUTH EVERY DAY   apixaban 5 MG Tabs tablet Commonly known as: ELIQUIS Take 1 tablet (5 mg total) by mouth 2 (two) times daily.   atorvastatin 80 MG tablet Commonly known as: LIPITOR Take 1 tablet (80 mg total) by mouth daily.   ezetimibe 10 MG tablet Commonly known as: ZETIA Take 1 tablet (10 mg total) by mouth daily.   Farxiga 10 MG Tabs tablet Generic drug: dapagliflozin propanediol TAKE 1 TABLET BY MOUTH DAILY BEFORE BREAKFAST.    Gvoke HypoPen 2-Pack 1 MG/0.2ML Soaj Generic drug: Glucagon Inject 1 Act into the skin daily as needed.   isosorbide mononitrate 30 MG 24 hr tablet Commonly known as: IMDUR Take 1 tablet (30 mg total) by mouth daily.   losartan 50 MG tablet Commonly known as: COZAAR Take 1 tablet (50 mg total) by mouth daily.   Magnesium 300 MG Caps Take 300 mg by mouth daily.   metFORMIN 1000 MG tablet Commonly known as: GLUCOPHAGE Take 1 tablet (1,000 mg total) by mouth 2 (two) times daily with a meal.   metoprolol succinate 25 MG 24 hr tablet Commonly known as: Toprol XL Take 1 tablet (25 mg total) by mouth daily.   multivitamin with minerals Tabs tablet Take 1 tablet by mouth daily.   pantoprazole 40 MG tablet Commonly known as: Protonix Please take 40 mg oral twice daily x30 days, then 40 mg oral daily.   Rybelsus 7 MG Tabs Generic drug: Semaglutide Take 7 mg by mouth daily.   traMADol 50 MG tablet Commonly known as: Ultram Take 1 tablet (50 mg total) by mouth every 6 (six) hours as needed.   Vitamin D (Ergocalciferol) 1.25 MG (50000 UNIT) Caps capsule Commonly known as: DRISDOL Take 50,000 Units by mouth every 7 (seven) days. Tuesdays        Allergies: No Known Allergies  Past Medical History, Surgical history,  Social history, and Family History were reviewed and updated.  Review of Systems: All other 10 point review of systems is negative.   Physical Exam:  height is '5\' 8"'$  (1.727 m) and weight is 188 lb (85.3 kg). His oral temperature is 98.6 F (37 C). His blood pressure is 129/68 and his pulse is 87. His respiration is 18 and oxygen saturation is 100%.   Wt Readings from Last 3 Encounters:  10/18/20 188 lb (85.3 kg)  09/08/20 186 lb (84.4 kg)  09/06/20 186 lb 1.3 oz (84.4 kg)    Ocular: Sclerae unicteric, pupils equal, round and reactive to light Ear-nose-throat: Oropharynx clear, dentition fair Lymphatic: No cervical or supraclavicular adenopathy Lungs no  rales or rhonchi, good excursion bilaterally Heart regular rate and rhythm, no murmur appreciated Abd soft, nontender, positive bowel sounds MSK no focal spinal tenderness, no joint edema Neuro: non-focal, well-oriented, appropriate affect Breasts: Deferred   Lab Results  Component Value Date   WBC 7.3 10/18/2020   HGB 11.1 (L) 10/18/2020   HCT 36.3 (L) 10/18/2020   MCV 80.0 10/18/2020   PLT 184 10/18/2020   Lab Results  Component Value Date   FERRITIN 21 (L) 09/06/2020   IRON 18 (L) 09/06/2020   TIBC 431 (H) 09/06/2020   UIBC 413 (H) 09/06/2020   IRONPCTSAT 4 (L) 09/06/2020   Lab Results  Component Value Date   RETICCTPCT 1.5 10/18/2020   RBC 4.46 10/18/2020   RBC 4.54 10/18/2020   No results found for: KPAFRELGTCHN, LAMBDASER, KAPLAMBRATIO No results found for: IGGSERUM, IGA, IGMSERUM No results found for: Odetta Pink, SPEI   Chemistry      Component Value Date/Time   NA 135 09/06/2020 1454   NA 138 05/24/2020 1102   K 4.0 09/06/2020 1454   CL 98 09/06/2020 1454   CO2 27 09/06/2020 1454   BUN 13 09/06/2020 1454   BUN 6 05/24/2020 1102   CREATININE 0.84 09/06/2020 1454      Component Value Date/Time   CALCIUM 10.0 09/06/2020 1454   ALKPHOS 82 09/06/2020 1454   AST 18 09/06/2020 1454   ALT 21 09/06/2020 1454   BILITOT 0.4 09/06/2020 1454       Impression and Plan: Mr. Olawale Stoneburner. is a very pleasant 60 yo Filipino gentleman with recent diagnosis of iron deficiency secondary to acute GI blood loss.  Iron studies are pending. We will replace if needed.  Follow-up in 3 months.  He can contact our office with any questions or concerns.   Laverna Peace, NP 8/30/202211:01 AM

## 2020-10-19 ENCOUNTER — Telehealth: Payer: Self-pay | Admitting: *Deleted

## 2020-10-19 NOTE — Telephone Encounter (Signed)
Per 10/18/20 LOS - called and lvm of upcoming appointments - requested callback to confirm - mailed calendar

## 2020-10-21 ENCOUNTER — Inpatient Hospital Stay: Payer: Managed Care, Other (non HMO) | Attending: Family

## 2020-10-21 ENCOUNTER — Other Ambulatory Visit: Payer: Self-pay

## 2020-10-21 VITALS — BP 111/62 | HR 92 | Temp 99.2°F | Resp 18

## 2020-10-21 DIAGNOSIS — K922 Gastrointestinal hemorrhage, unspecified: Secondary | ICD-10-CM | POA: Diagnosis present

## 2020-10-21 DIAGNOSIS — D500 Iron deficiency anemia secondary to blood loss (chronic): Secondary | ICD-10-CM | POA: Insufficient documentation

## 2020-10-21 MED ORDER — SODIUM CHLORIDE 0.9 % IV SOLN
125.0000 mg | Freq: Once | INTRAVENOUS | Status: AC
  Administered 2020-10-21: 125 mg via INTRAVENOUS
  Filled 2020-10-21: qty 125

## 2020-10-21 MED ORDER — SODIUM CHLORIDE 0.9 % IV SOLN: Freq: Once | INTRAVENOUS | Status: AC

## 2020-10-21 NOTE — Patient Instructions (Addendum)
Sodium Ferric Gluconate Complex Injection What is this medication? (Ferrelicit) SODIUM FERRIC GLUCONATE COMPLEX (SOE dee um FER ik GLOO koe nate KOM pleks) treats low levels of iron (iron deficiency anemia) in people with kidney disease. Iron is a mineral that plays an important role in making red blood cells, which carry oxygen from your lungs to the rest of your body. This medicine may be used for other purposes; ask your health care provider or pharmacist if you have questions. COMMON BRAND NAME(S): Ferrlecit, Nulecit What should I tell my care team before I take this medication? They need to know if you have any of the following conditions: Anemia that is not from iron deficiency High levels of iron in the blood An unusual or allergic reaction to iron, other medications, foods, dyes, or preservatives Pregnant or are trying to become pregnant Breast-feeding How should I use this medication? This medication is injected into a vein. It is given by your care team in a hospital or clinic setting. Talk to your care team about the use of this medication in children. While it may be prescribed for children as young as 6 years for selected conditions, precautions do apply. Overdosage: If you think you have taken too much of this medicine contact a poison control center or emergency room at once. NOTE: This medicine is only for you. Do not share this medicine with others. What if I miss a dose? It is important not to miss your dose. Call your care team if you are unable to keep an appointment. What may interact with this medication? Do not take this medication with any of the following: Deferasirox Deferoxamine Dimercaprol This medication may also interact with the following: Other iron products This list may not describe all possible interactions. Give your health care provider a list of all the medicines, herbs, non-prescription drugs, or dietary supplements you use. Also tell them if you  smoke, drink alcohol, or use illegal drugs. Some items may interact with your medicine. What should I watch for while using this medication? Your condition will be monitored carefully while you are receiving this medication. Visit your care team for regular checks on your progress. You may need blood work while you are taking this medication. What side effects may I notice from receiving this medication? Side effects that you should report to your care team as soon as possible: Allergic reactions-skin rash, itching, hives, swelling of the face, lips, tongue, or throat Low blood pressure-dizziness, feeling faint or lightheaded, blurry vision Shortness of breath Side effects that usually do not require medical attention (report to your care team if they continue or are bothersome): Flushing Headache Joint pain Muscle pain Nausea Pain, redness, or irritation at injection site This list may not describe all possible side effects. Call your doctor for medical advice about side effects. You may report side effects to FDA at 1-800-FDA-1088. Where should I keep my medication? This medication is given in a hospital or clinic and will not be stored at home. NOTE: This sheet is a summary. It may not cover all possible information. If you have questions about this medicine, talk to your doctor, pharmacist, or health care provider.  2022 Elsevier/Gold Standard (2020-03-21 11:46:34)

## 2020-10-25 ENCOUNTER — Other Ambulatory Visit: Payer: Managed Care, Other (non HMO) | Admitting: *Deleted

## 2020-10-25 ENCOUNTER — Ambulatory Visit (HOSPITAL_COMMUNITY): Payer: Managed Care, Other (non HMO) | Attending: Internal Medicine

## 2020-10-25 ENCOUNTER — Other Ambulatory Visit: Payer: Self-pay

## 2020-10-25 DIAGNOSIS — E782 Mixed hyperlipidemia: Secondary | ICD-10-CM

## 2020-10-25 DIAGNOSIS — Z79899 Other long term (current) drug therapy: Secondary | ICD-10-CM

## 2020-10-25 DIAGNOSIS — Z951 Presence of aortocoronary bypass graft: Secondary | ICD-10-CM | POA: Diagnosis present

## 2020-10-25 DIAGNOSIS — I2510 Atherosclerotic heart disease of native coronary artery without angina pectoris: Secondary | ICD-10-CM

## 2020-10-25 LAB — LIPID PANEL
Chol/HDL Ratio: 4.3 ratio (ref 0.0–5.0)
Cholesterol, Total: 121 mg/dL (ref 100–199)
HDL: 28 mg/dL — ABNORMAL LOW (ref >39)
LDL Chol Calc (NIH): 59 mg/dL (ref 0–99)
Triglycerides: 204 mg/dL — ABNORMAL HIGH (ref 0–149)
VLDL Cholesterol Cal: 34 mg/dL (ref 5–40)

## 2020-10-25 LAB — ECHOCARDIOGRAM COMPLETE
Area-P 1/2: 3.02 cm2
S' Lateral: 2.6 cm

## 2020-10-26 ENCOUNTER — Encounter: Payer: Managed Care, Other (non HMO) | Admitting: Physical Therapy

## 2020-10-28 ENCOUNTER — Telehealth: Payer: Self-pay | Admitting: *Deleted

## 2020-10-28 ENCOUNTER — Inpatient Hospital Stay: Payer: Managed Care, Other (non HMO)

## 2020-10-28 ENCOUNTER — Other Ambulatory Visit: Payer: Self-pay

## 2020-10-28 VITALS — BP 112/58 | HR 87 | Temp 98.1°F | Resp 16

## 2020-10-28 DIAGNOSIS — D500 Iron deficiency anemia secondary to blood loss (chronic): Secondary | ICD-10-CM | POA: Diagnosis not present

## 2020-10-28 MED ORDER — ICOSAPENT ETHYL 1 G PO CAPS: 2.0000 g | ORAL_CAPSULE | Freq: Two times a day (BID) | ORAL | 4 refills | Status: DC

## 2020-10-28 MED ORDER — SODIUM CHLORIDE 0.9 % IV SOLN: Freq: Once | INTRAVENOUS | Status: AC

## 2020-10-28 MED ORDER — SODIUM CHLORIDE 0.9 % IV SOLN
125.0000 mg | Freq: Once | INTRAVENOUS | Status: AC
  Administered 2020-10-28: 125 mg via INTRAVENOUS
  Filled 2020-10-28: qty 125

## 2020-10-28 NOTE — Patient Instructions (Signed)
Sodium Ferric Gluconate Complex Injection What is this medication? SODIUM FERRIC GLUCONATE COMPLEX (SOE dee um FER ik GLOO koe nate KOM pleks) treats low levels of iron (iron deficiency anemia) in people with kidney disease. Iron is a mineral that plays an important role in making red blood cells, which carry oxygen from your lungs to the rest of your body. This medicine may be used for other purposes; ask your health care provider or pharmacist if you have questions. COMMON BRAND NAME(S): Ferrlecit, Nulecit What should I tell my care team before I take this medication? They need to know if you have any of the following conditions: Anemia that is not from iron deficiency High levels of iron in the blood An unusual or allergic reaction to iron, other medications, foods, dyes, or preservatives Pregnant or are trying to become pregnant Breast-feeding How should I use this medication? This medication is injected into a vein. It is given by your care team in a hospital or clinic setting. Talk to your care team about the use of this medication in children. While it may be prescribed for children as young as 6 years for selected conditions, precautions do apply. Overdosage: If you think you have taken too much of this medicine contact a poison control center or emergency room at once. NOTE: This medicine is only for you. Do not share this medicine with others. What if I miss a dose? It is important not to miss your dose. Call your care team if you are unable to keep an appointment. What may interact with this medication? Do not take this medication with any of the following: Deferasirox Deferoxamine Dimercaprol This medication may also interact with the following: Other iron products This list may not describe all possible interactions. Give your health care provider a list of all the medicines, herbs, non-prescription drugs, or dietary supplements you use. Also tell them if you smoke, drink  alcohol, or use illegal drugs. Some items may interact with your medicine. What should I watch for while using this medication? Your condition will be monitored carefully while you are receiving this medication. Visit your care team for regular checks on your progress. You may need blood work while you are taking this medication. What side effects may I notice from receiving this medication? Side effects that you should report to your care team as soon as possible: Allergic reactions-skin rash, itching, hives, swelling of the face, lips, tongue, or throat Low blood pressure-dizziness, feeling faint or lightheaded, blurry vision Shortness of breath Side effects that usually do not require medical attention (report to your care team if they continue or are bothersome): Flushing Headache Joint pain Muscle pain Nausea Pain, redness, or irritation at injection site This list may not describe all possible side effects. Call your doctor for medical advice about side effects. You may report side effects to FDA at 1-800-FDA-1088. Where should I keep my medication? This medication is given in a hospital or clinic and will not be stored at home. NOTE: This sheet is a summary. It may not cover all possible information. If you have questions about this medicine, talk to your doctor, pharmacist, or health care provider.  2022 Elsevier/Gold Standard (2020-03-21 11:46:34)  

## 2020-10-28 NOTE — Telephone Encounter (Signed)
Pt made aware of med recommendations per [place] PharmD.  Confirmed the pharmacy of choice with the pt.  Pt asked that I mail him his copay card for Vascepa to his confirmed mailing address. Pt verbalized understanding and agrees with this plan.

## 2020-10-28 NOTE — Telephone Encounter (Signed)
-----   Message from Leeroy Bock, [place] sent at 10/28/2020 11:45 AM EDT ----- Uncontrolled DM is likely contributing to elevated TG - his A1c was 8.3% a few months ago. Recommend adding Vascepa 2g BID for TG lowering and CV benefit. He will qualify for Vascepa copay card as well.

## 2020-11-04 ENCOUNTER — Ambulatory Visit: Payer: Managed Care, Other (non HMO) | Attending: Family Medicine | Admitting: Physical Therapy

## 2020-11-04 ENCOUNTER — Other Ambulatory Visit: Payer: Self-pay

## 2020-11-04 DIAGNOSIS — M25612 Stiffness of left shoulder, not elsewhere classified: Secondary | ICD-10-CM | POA: Insufficient documentation

## 2020-11-04 DIAGNOSIS — M25512 Pain in left shoulder: Secondary | ICD-10-CM | POA: Insufficient documentation

## 2020-11-04 DIAGNOSIS — G8929 Other chronic pain: Secondary | ICD-10-CM | POA: Insufficient documentation

## 2020-11-04 DIAGNOSIS — R252 Cramp and spasm: Secondary | ICD-10-CM | POA: Insufficient documentation

## 2020-11-04 NOTE — Therapy (Signed)
[place]. [place], [place], [address] Phone: [phone]   Fax:  [phone]  Physical Therapy Treatment  Patient Details  Name: Colton Guzman [facility]. MRN: [id number] Date of Birth: [birth year] Referring Provider (PT): Cherrie Distance Date: 11/04/2020   PT End of Session - 11/04/20 0921     Visit Number 6    Date for PT Re-Evaluation 11/25/20    PT Start Time 0915    PT Stop Time 0945    PT Time Calculation (min) 30 min             Past Medical History:  Diagnosis Date   CAD (coronary artery disease) of artery bypass graft 02/2020   Diabetes mellitus without complication ([place])    GI bleed    Hyperlipidemia LDL goal <70    Hypertension    PAF (paroxysmal atrial fibrillation) ([place])     Past Surgical History:  Procedure Laterality Date   BIOPSY  08/17/2020   Procedure: BIOPSY;  Surgeon: Mauri Pole, MD;  Location: [place];  Service: Endoscopy;;   COLONOSCOPY WITH PROPOFOL Left 02/27/2020   Procedure: COLONOSCOPY WITH PROPOFOL;  Surgeon: Otis Brace, MD;  Location: [facility] ENDOSCOPY;  Service: Gastroenterology;  Laterality: Left;   COLONOSCOPY WITH PROPOFOL N/A 08/13/2020   Procedure: COLONOSCOPY WITH PROPOFOL;  Surgeon: Gatha Mayer, MD;  Location: [facility] ENDOSCOPY;  Service: Endoscopy;  Laterality: N/A;   CORONARY ARTERY BYPASS GRAFT N/A 03/08/2020   Procedure: CORONARY ARTERY BYPASS GRAFTING (CABG) x 5 ON PUMP;  Surgeon: Wonda Olds, MD;  Location: [facility] OR;  Service: Open Heart Surgery;  Laterality: N/A;   ENDOVEIN HARVEST OF GREATER SAPHENOUS VEIN Right 03/08/2020   Procedure: ENDOVEIN HARVEST OF GREATER SAPHENOUS VEIN;  Surgeon: Wonda Olds, MD;  Location: [place];  Service: Open Heart Surgery;  Laterality: Right;   ESOPHAGOGASTRODUODENOSCOPY (EGD) WITH PROPOFOL N/A 02/27/2020   Procedure: ESOPHAGOGASTRODUODENOSCOPY (EGD) WITH PROPOFOL;  Surgeon: Otis Brace, MD;  Location:  [facility] ENDOSCOPY;  Service: Gastroenterology;  Laterality: N/A;   ESOPHAGOGASTRODUODENOSCOPY (EGD) WITH PROPOFOL N/A 08/17/2020   Procedure: ESOPHAGOGASTRODUODENOSCOPY (EGD) WITH PROPOFOL;  Surgeon: Mauri Pole, MD;  Location: [facility] ENDOSCOPY;  Service: Endoscopy;  Laterality: N/A;   LEFT HEART CATH AND CORONARY ANGIOGRAPHY N/A 03/04/2020   Procedure: LEFT HEART CATH AND CORONARY ANGIOGRAPHY;  Surgeon: Burnell Blanks, MD;  Location: [place] CV LAB;  Service: Cardiovascular;  Laterality: N/A;   RADIAL ARTERY HARVEST Left 03/08/2020   Procedure: RADIAL ARTERY HARVEST;  Surgeon: Wonda Olds, MD;  Location: [place];  Service: Open Heart Surgery;  Laterality: Left;   TEE WITHOUT CARDIOVERSION N/A 03/08/2020   Procedure: TRANSESOPHAGEAL ECHOCARDIOGRAM (TEE);  Surgeon: Wonda Olds, MD;  Location: [facility];  Service: Open Heart Surgery;  Laterality: N/A;    There were no vitals filed for this visit.   Subjective Assessment - 11/04/20 0916     Subjective pt arrives to PT after 1 minth,states some better but wants more ROM. verb doing HEP "some" " maybe every other day" pt verb pain and wearing pain patches    Currently in Pain? Yes    Pain Score 6     Pain Location Shoulder    Pain Orientation Left                [facility] PT Assessment - 11/04/20 0001       AROM   AROM Assessment Site Shoulder    Right/Left Shoulder  Left    Left Shoulder Flexion 105 Degrees    Left Shoulder ABduction 100 Degrees      PROM   PROM Assessment Site Shoulder    Right/Left Shoulder Left    Left Shoulder Flexion --    Left Shoulder ABduction --                           [facility] Adult PT Treatment/Exercise - 11/04/20 0001       Shoulder Exercises: Supine   External Rotation AROM;Left;10 reps    External Rotation Weight (lbs) 4    Internal Rotation Strengthening;Left;10 reps    Internal Rotation Weight (lbs) 4    Flexion AROM;Left;10 reps    Shoulder Flexion Weight  (lbs) 4      Shoulder Exercises: Sidelying   External Rotation Strengthening;Left;10 reps    External Rotation Weight (lbs) 4    ABduction AROM;Left;10 reps    ABduction Weight (lbs) 4      Shoulder Exercises: ROM/Strengthening   UBE (Upper Arm Bike) L 3 3 min fwd and back      Manual Therapy   Manual Therapy Joint mobilization;Passive ROM    Joint Mobilization L GH AP, PA, inferior, and distraction jt mobs grade III/IV    Passive ROM PROM to L shoulder all directions to tolerance with prolonged holds                       PT Short Term Goals - 09/30/20 1056       PT SHORT TERM GOAL #1   Title Pt will be I with initial HEP    Status Achieved               PT Long Term Goals - 11/04/20 1026       PT LONG TERM GOAL #1   Title Pt will be I with advanced HEP    Status On-going      PT LONG TERM GOAL #2   Title Pt will demo L shoulder flexion >/= 145 deg    Status Partially Met      PT LONG TERM GOAL #3   Title Pt will demo L shoulder abduction >/= 120 deg    Status Partially Met      PT LONG TERM GOAL #4   Title Pt will report 50% reduction in L shoulder pain    Status Partially Met                   Plan - 11/04/20 1027     Clinical Impression Statement pt arrived to PT today after 1 month. Pt stated better but wants more ROM. Pt states doing HEP some, maybe every ther day. Stressed need to be more complaint with HEP for better outcomes and he VU. Slight increase in AROM standing. Limited rotation esp ER. Pt pain limited with PROM but verb felt better and looser after- declined ice here and stated he would ice at home.    PT Treatment/Interventions ADLs/Self Care Home Management;Electrical Stimulation;Iontophoresis 4mg /ml Dexamethasone;Moist Heat;Therapeutic activities;Therapeutic exercise;Manual techniques;Passive range of motion;Dry needling    PT Next Visit Plan progress func and follow up on HEP compliance             Patient  will benefit from skilled therapeutic intervention in order to improve the following deficits and impairments:  Decreased range of motion, Increased muscle spasms, Impaired UE functional use, Pain, Hypomobility, Impaired  flexibility, Decreased strength, Postural dysfunction  Visit Diagnosis: Chronic left shoulder pain  Stiffness of left shoulder, not elsewhere classified  Cramp and spasm     Problem List Patient Active Problem List   Diagnosis Date Noted   Iron deficiency anemia due to chronic blood loss 08/26/2020   Deficiency anemia 08/26/2020   PAF (paroxysmal atrial fibrillation) ([place]) 08/12/2020   Sinus tachycardia    Vitamin B12 deficiency anemia due to intrinsic factor deficiency 05/19/2020   Primary osteoarthritis involving multiple joints 05/19/2020   Dyslipidemia, goal LDL below 70 05/18/2020   T2DM (type 2 diabetes mellitus) ([place]) 02/22/2020   Hypertension    Gout     Mcclain Shall,ANGIE, PTA 11/04/2020, 10:30 AM  [place]. [place], [place], [address] Phone: [phone]   Fax:  [phone]  Name: Jamorian Dimaria [facility]. MRN: [id number] Date of Birth: [birth year]

## 2020-11-09 ENCOUNTER — Ambulatory Visit: Payer: Managed Care, Other (non HMO) | Admitting: Physical Therapy

## 2020-11-15 ENCOUNTER — Ambulatory Visit: Payer: Managed Care, Other (non HMO) | Admitting: Physical Therapy

## 2020-11-16 ENCOUNTER — Other Ambulatory Visit: Payer: Self-pay | Admitting: Physician Assistant

## 2020-11-29 ENCOUNTER — Other Ambulatory Visit: Payer: Self-pay

## 2020-11-29 MED ORDER — APIXABAN 5 MG PO TABS: 5.0000 mg | ORAL_TABLET | Freq: Two times a day (BID) | ORAL | 5 refills | Status: DC

## 2020-11-29 NOTE — Telephone Encounter (Signed)
Pt last saw Dr Johney Frame 08/31/20, last labs 09/06/20 Creat 0.84, age 60, weight 85.3kg, based on specified criteria pt is on appropriate dosage of Eliquis '5mg'$  BID for afib.  Will refill rx.

## 2020-12-01 NOTE — Progress Notes (Signed)
Electrophysiology Office Note:    Date:  12/02/2020   ID:  Colton Neptune Sheila Pavlicek., DOB 1960/07/23, MRN [id number]  PCP:  [name], MD  [facility] Cardiologist:  Freada Bergeron, MD  [facility] Electrophysiologist:  Vickie Epley, MD   Referring MD: [name], MD   Chief Complaint: Atrial fibrillation  History of Present Illness:    Colton Neptune Gerasimos Schmith. is a 60 y.o. male who presents for an evaluation of atrial fibrillation at the request of Dr. Ronnald Ramp. Their medical history includes coronary artery disease, diabetes, GI bleed, hyperlipidemia, hypertension and paroxysmal atrial fibrillation.  The patient was previously seen by Dr. Johney Frame on August 31, 2020.  That appointment was in the setting of a prolonged admission to [facility] long and main campus.  He was admitted with hematochezia.  His hospital course was complicated by VT arrest requiring CPR.  Coronary artery disease was diagnosed and he underwent CABG on March 08, 2020.  He had another admission for GI bleed in June of this year from June 24 to June 30.  He required 6 units of PRBCs.  His aspirin and Eliquis were initially held.  Colonoscopy revealed diverticulosis.  There were also nonbleeding gastric ulcers on EGD.  His Eliquis was restarted.  At the time of the appointment with Dr. Johney Frame, he had not had a recurrence of GI bleeding.  The patient is interested in a long-term stroke prevention strategy that avoids long-term exposure to anticoagulation.    Past Medical History:  Diagnosis Date   CAD (coronary artery disease) of artery bypass graft 02/2020   Diabetes mellitus without complication (HCC)    GI bleed    Hyperlipidemia LDL goal <70    Hypertension    PAF (paroxysmal atrial fibrillation) [facility])     Past Surgical History:  Procedure Laterality Date   BIOPSY  08/17/2020   Procedure: BIOPSY;  Surgeon: Mauri Pole, MD;  Location: [place];  Service: Endoscopy;;    COLONOSCOPY WITH PROPOFOL Left 02/27/2020   Procedure: COLONOSCOPY WITH PROPOFOL;  Surgeon: Otis Brace, MD;  Location: [facility] ENDOSCOPY;  Service: Gastroenterology;  Laterality: Left;   COLONOSCOPY WITH PROPOFOL N/A 08/13/2020   Procedure: COLONOSCOPY WITH PROPOFOL;  Surgeon: Gatha Mayer, MD;  Location: [facility] ENDOSCOPY;  Service: Endoscopy;  Laterality: N/A;   CORONARY ARTERY BYPASS GRAFT N/A 03/08/2020   Procedure: CORONARY ARTERY BYPASS GRAFTING (CABG) x 5 ON PUMP;  Surgeon: Wonda Olds, MD;  Location: [facility] OR;  Service: Open Heart Surgery;  Laterality: N/A;   ENDOVEIN HARVEST OF GREATER SAPHENOUS VEIN Right 03/08/2020   Procedure: ENDOVEIN HARVEST OF GREATER SAPHENOUS VEIN;  Surgeon: Wonda Olds, MD;  Location: [place];  Service: Open Heart Surgery;  Laterality: Right;   ESOPHAGOGASTRODUODENOSCOPY (EGD) WITH PROPOFOL N/A 02/27/2020   Procedure: ESOPHAGOGASTRODUODENOSCOPY (EGD) WITH PROPOFOL;  Surgeon: Otis Brace, MD;  Location: [facility] ENDOSCOPY;  Service: Gastroenterology;  Laterality: N/A;   ESOPHAGOGASTRODUODENOSCOPY (EGD) WITH PROPOFOL N/A 08/17/2020   Procedure: ESOPHAGOGASTRODUODENOSCOPY (EGD) WITH PROPOFOL;  Surgeon: Mauri Pole, MD;  Location: [place] ENDOSCOPY;  Service: Endoscopy;  Laterality: N/A;   LEFT HEART CATH AND CORONARY ANGIOGRAPHY N/A 03/04/2020   Procedure: LEFT HEART CATH AND CORONARY ANGIOGRAPHY;  Surgeon: Burnell Blanks, MD;  Location: [place] CV LAB;  Service: Cardiovascular;  Laterality: N/A;   RADIAL ARTERY HARVEST Left 03/08/2020   Procedure: RADIAL ARTERY HARVEST;  Surgeon: Wonda Olds, MD;  Location: [place];  Service: Open Heart Surgery;  Laterality: Left;   TEE WITHOUT CARDIOVERSION N/A 03/08/2020   Procedure: TRANSESOPHAGEAL ECHOCARDIOGRAM (TEE);  Surgeon: Wonda Olds, MD;  Location: [place];  Service: Open Heart Surgery;  Laterality: N/A;    Current Medications: Current Meds  Medication Sig   acetaminophen (TYLENOL) 500 MG tablet  Take 1,000 mg by mouth every 4 (four) hours as needed (pain).   allopurinol (ZYLOPRIM) 100 MG tablet TAKE 1 TABLET BY MOUTH EVERY DAY   apixaban (ELIQUIS) 5 MG TABS tablet Take 1 tablet (5 mg total) by mouth 2 (two) times daily.   atorvastatin (LIPITOR) 80 MG tablet Take 1 tablet (80 mg total) by mouth daily.   ezetimibe (ZETIA) 10 MG tablet Take 1 tablet (10 mg total) by mouth daily.   FARXIGA 10 MG TABS tablet TAKE 1 TABLET BY MOUTH DAILY BEFORE BREAKFAST.   Ferric Maltol (ACCRUFER) 30 MG CAPS Take 1 capsule by mouth in the morning and at bedtime.   Glucagon (GVOKE HYPOPEN 2-PACK) 1 MG/0.2ML SOAJ Inject 1 Act into the skin daily as needed.   icosapent Ethyl (VASCEPA) 1 g capsule Take 2 capsules (2 g total) by mouth 2 (two) times daily.   losartan (COZAAR) 50 MG tablet Take 1 tablet (50 mg total) by mouth daily.   Magnesium 300 MG CAPS Take 300 mg by mouth daily.   metFORMIN (GLUCOPHAGE) 1000 MG tablet Take 1 tablet (1,000 mg total) by mouth 2 (two) times daily with a meal.   metoprolol succinate (TOPROL XL) 25 MG 24 hr tablet Take 1 tablet (25 mg total) by mouth daily.   Multiple Vitamin (MULTIVITAMIN WITH MINERALS) TABS tablet Take 1 tablet by mouth daily.   pantoprazole (PROTONIX) 40 MG tablet Please take 40 mg oral twice daily x30 days, then 40 mg oral daily.   Semaglutide (RYBELSUS) 7 MG TABS Take 7 mg by mouth daily.   traMADol (ULTRAM) 50 MG tablet Take 1 tablet (50 mg total) by mouth every 6 (six) hours as needed.   Vitamin D, Ergocalciferol, (DRISDOL) 1.25 MG (50000 UNIT) CAPS capsule Take 50,000 Units by mouth every 7 (seven) days. Tuesdays     Allergies:   Patient has no known allergies.   Social History   Socioeconomic History   Marital status: Married    Spouse name: Not on file   Number of children: Not on file   Years of education: Not on file   Highest education level: Not on file  Occupational History   Not on file  Tobacco Use   Smoking status: Former     Packs/day: 1.50    Types: Cigarettes    Quit date: 2012    Years since quitting: 10.7   Smokeless tobacco: Never  Vaping Use   Vaping Use: Former   Quit date: 08/28/2010  Substance and Sexual Activity   Alcohol use: Yes   Drug use: Not on file   Sexual activity: Not on file  Other Topics Concern   Not on file  Social History Narrative   Not on file   Social Determinants of Health   Financial Resource Strain: Not on file  Food Insecurity: Not on file  Transportation Needs: Not on file  Physical Activity: Not on file  Stress: Not on file  Social Connections: Not on file     Family History: The patient's family history is not on file.  ROS:   Please see the history of present illness.    All other systems reviewed and are negative.  EKGs/Labs/Other  Studies Reviewed:    The following studies were reviewed today:  October 25, 2020 echo Left ventricular function normal, 60% Right ventricular function normal Trivial MR Trivial AI  March 28, 2020 Holter Less than 1% A. fib burden 3% PVC burden  EKG:  The ekg ordered today demonstrates sinus rhythm   Recent Labs: 02/23/2020: TSH 1.033 07/26/2020: Pro B Natriuretic peptide (BNP) 37.0 08/17/2020: B Natriuretic Peptide 128.6; Magnesium 1.9 09/06/2020: ALT 21; BUN 13; Creatinine 0.84; Potassium 4.0; Sodium 135 10/18/2020: Hemoglobin 11.1; Platelet Count 184  Recent Lipid Panel    Component Value Date/Time   CHOL 121 10/25/2020 0833   TRIG 204 (H) 10/25/2020 0833   HDL 28 (L) 10/25/2020 0833   CHOLHDL 4.3 10/25/2020 0833   CHOLHDL 4 07/26/2020 1144   VLDL 53.6 (H) 07/26/2020 1144   LDLCALC 59 10/25/2020 0833   LDLDIRECT 93.0 07/26/2020 1144    Physical Exam:    VS:  BP 140/83   Pulse 89   Ht '5\' 8"'$  (1.727 m)   Wt 186 lb (84.4 kg)   BMI 28.28 kg/m     Wt Readings from Last 3 Encounters:  12/02/20 186 lb (84.4 kg)  10/18/20 188 lb (85.3 kg)  09/08/20 186 lb (84.4 kg)     GEN:  Well nourished, well  developed in no acute distress HEENT: Normal NECK: No JVD; No carotid bruits LYMPHATICS: No lymphadenopathy CARDIAC: RRR, no murmurs, rubs, gallops.  Sternotomy well-healed RESPIRATORY:  Clear to auscultation without rales, wheezing or rhonchi  ABDOMEN: Soft, non-tender, non-distended MUSCULOSKELETAL:  No edema; No deformity  SKIN: Warm and dry NEUROLOGIC:  Alert and oriented x 3 PSYCHIATRIC:  Normal affect       ASSESSMENT:    1. Paroxysmal atrial fibrillation (HCC)   2. Gastrointestinal hemorrhage, unspecified gastrointestinal hemorrhage type   3. Primary hypertension   4. Non-ST elevation (NSTEMI) myocardial infarction ([place])   5. Coronary artery disease involving native coronary artery of native heart without angina pectoris   6. H/O five vessel coronary artery bypass    PLAN:    In order of problems listed above:  1. Paroxysmal atrial fibrillation (HCC) 2. Gastrointestinal hemorrhage, unspecified gastrointestinal hemorrhage type Patient has a history of paroxysmal atrial fibrillation.  He is currently taking Eliquis 5 mg by mouth twice daily.  He has a history of multiple severe GI bleeds requiring blood transfusions.  He is interested in a long-term stroke prevention strategy that avoids long-term exposure anticoagulation.  We discussed the watchman procedure in detail during today's visit including the rationale, risks, recovery and need for short-term anticoagulation.  He would like to proceed with work-up and scheduling.  I would plan on continuing him on Eliquis 5 mg by mouth twice daily up until the day of the procedure.  After watchman implant, please add aspirin 81 mg by mouth once daily to the regimen and continue this for 45 days after implant.  A follow-up 45-day transesophageal echo shows a well-seated device without thrombus, plan to discontinue Eliquis and add Plavix 75 mg by mouth once daily.  6 months after implant, would discontinue Plavix and continue aspirin  monotherapy indefinitely.  ----------  I have seen [employer]. in the office today who is being considered for a Watchman left atrial appendage closure device. I believe they will benefit from this procedure given their history of atrial fibrillation, CHA2DS2-VASc score of 3 and unadjusted ischemic stroke rate of 3.2% per year. Unfortunately, the patient is not felt to  be a long term anticoagulation candidate secondary to history of severe GI bleeding. The patient's chart has been reviewed and I feel that they would be a candidate for short term oral anticoagulation after Watchman implant.   It is my belief that after undergoing a LAA closure procedure, [employer]. will not need long term anticoagulation which eliminates anticoagulation side effects and major bleeding risk.   Procedural risks for the Watchman implant have been reviewed with the patient including a 0.5% risk of stroke, <1% risk of perforation and <1% risk of device embolization.    The published clinical data on the safety and effectiveness of WATCHMAN include but are not limited to the following: - Holmes DR, Mechele Claude, Sick P et al. for the PROTECT AF Investigators. Percutaneous closure of the left atrial appendage versus warfarin therapy for prevention of stroke in patients with atrial fibrillation: a randomised non-inferiority trial. Lancet 2009; 374: 534-42. Mechele Claude, Doshi SK, Abelardo Diesel D et al. on behalf of the PROTECT AF Investigators. Percutaneous Left Atrial Appendage Closure for Stroke Prophylaxis in Patients With Atrial Fibrillation 2.3-Year Follow-up of the PROTECT AF (Watchman Left Atrial Appendage System for Embolic Protection in Patients With Atrial Fibrillation) Trial. Circulation 2013; 127:720-729. - Alli O, Doshi S,  Kar S, Reddy VY, Sievert H et al. Quality of Life Assessment in the Randomized PROTECT AF (Percutaneous Closure of the Left Atrial Appendage Versus Warfarin Therapy for  Prevention of Stroke in Patients With Atrial Fibrillation) Trial of Patients at Risk for Stroke With Nonvalvular Atrial Fibrillation. J Am Coll Cardiol 2013; P4788364. Vertell Limber DR, Tarri Abernethy, Price M, [place], Sievert H, Doshi S, Huber K, Reddy V. Prospective randomized evaluation of the Watchman left atrial appendage Device in patients with atrial fibrillation versus long-term warfarin therapy; the PREVAIL trial. Journal of the [employer] of Cardiology, Vol. 4, No. 1, 2014, 1-11. - Kar S, Doshi SK, Sadhu A, Horton R, Osorio J et al. Primary outcome evaluation of a next-generation left atrial appendage closure device: results from the PINNACLE FLX trial. Circulation 2021;143(18)1754-1762.    After today's visit with the patient which was dedicated solely for shared decision making visit regarding LAA closure device, the patient decided to proceed with the LAA appendage closure procedure scheduled to be done in the near future at [facility]. Prior to the procedure, I would like to obtain a gated CT scan of the chest with contrast timed for PV/LA visualization.    3. Primary hypertension Controlled.  Continue current medication regimen  4. Non-ST elevation (NSTEMI) myocardial infarction ([place]) 5. Coronary artery disease involving native coronary artery of native heart without angina pectoris 6. H/O five vessel coronary artery bypass No ischemic symptoms today.  Continue current medication regimen.     Total time spent with patient today 65 minutes. This includes reviewing records, evaluating the patient and coordinating care.  Medication Adjustments/Labs and Tests Ordered: Current medicines are reviewed at length with the patient today.  Concerns regarding medicines are outlined above.  No orders of the defined types were placed in this encounter.  No orders of the defined types were placed in this encounter.    Signed, Hilton Cork. Quentin Ore, MD, [facility], [facility] 12/02/2020 1:17  PM    Electrophysiology [facility]

## 2020-12-02 ENCOUNTER — Other Ambulatory Visit: Payer: Self-pay

## 2020-12-02 ENCOUNTER — Encounter: Payer: Self-pay | Admitting: Cardiology

## 2020-12-02 ENCOUNTER — Ambulatory Visit (INDEPENDENT_AMBULATORY_CARE_PROVIDER_SITE_OTHER): Payer: Managed Care, Other (non HMO) | Admitting: Cardiology

## 2020-12-02 VITALS — BP 140/83 | HR 89 | Ht 68.0 in | Wt 186.0 lb

## 2020-12-02 DIAGNOSIS — I480 Paroxysmal atrial fibrillation: Secondary | ICD-10-CM | POA: Diagnosis not present

## 2020-12-02 DIAGNOSIS — I2510 Atherosclerotic heart disease of native coronary artery without angina pectoris: Secondary | ICD-10-CM | POA: Diagnosis not present

## 2020-12-02 DIAGNOSIS — Z951 Presence of aortocoronary bypass graft: Secondary | ICD-10-CM

## 2020-12-02 DIAGNOSIS — I10 Essential (primary) hypertension: Secondary | ICD-10-CM

## 2020-12-02 DIAGNOSIS — E1159 Type 2 diabetes mellitus with other circulatory complications: Secondary | ICD-10-CM

## 2020-12-02 DIAGNOSIS — K922 Gastrointestinal hemorrhage, unspecified: Secondary | ICD-10-CM

## 2020-12-02 DIAGNOSIS — I214 Non-ST elevation (NSTEMI) myocardial infarction: Secondary | ICD-10-CM | POA: Diagnosis not present

## 2020-12-02 NOTE — Patient Instructions (Signed)
Medication Instructions:  Your physician recommends that you continue on your current medications as directed. Please refer to the Current Medication list given to you today. *If you need a refill on your cardiac medications before your next appointment, please call your pharmacy*  Lab Work: None ordered. If you have labs (blood work) drawn today and your tests are completely normal, you will receive your results only by: [place] (if you have MyChart) OR A paper copy in the mail If you have any lab test that is abnormal or we need to change your treatment, we will call you to review the results.  Testing/Procedures: None ordered.  Follow-Up: At [facility], you and your health needs are our priority.  As part of our continuing mission to provide you with exceptional heart care, we have created designated Provider Care Teams.  These Care Teams include your primary Cardiologist (physician) and Advanced Practice Providers (APPs -  Physician Assistants and Nurse Practitioners) who all work together to provide you with the care you need, when you need it.  Your next appointment:    Let me know if you would like to go ahead with Watchman evaluation.  Sonia Baller RN

## 2020-12-06 ENCOUNTER — Other Ambulatory Visit: Payer: Self-pay | Admitting: Internal Medicine

## 2020-12-06 ENCOUNTER — Telehealth: Payer: Self-pay | Admitting: Cardiology

## 2020-12-06 DIAGNOSIS — M1A09X0 Idiopathic chronic gout, multiple sites, without tophus (tophi): Secondary | ICD-10-CM

## 2020-12-06 DIAGNOSIS — K922 Gastrointestinal hemorrhage, unspecified: Secondary | ICD-10-CM

## 2020-12-06 DIAGNOSIS — I480 Paroxysmal atrial fibrillation: Secondary | ICD-10-CM

## 2020-12-06 NOTE — Telephone Encounter (Signed)
Patient is requesting to schedule a watchman procedure.

## 2020-12-06 NOTE — Telephone Encounter (Signed)
Left message to call back.   Will need to arrange blood work and pre-Watchman CT.

## 2020-12-06 NOTE — Telephone Encounter (Signed)
The patient reports he would like to proceed with Watchman evaluation. Scheduled the patient for BMET 12/13/2020. He understands he will be called to arrange Watchman CT. Will send instructions thru MyChart and he agrees to respond so I know they were received. He was grateful for assistance.

## 2020-12-13 ENCOUNTER — Other Ambulatory Visit: Payer: Self-pay

## 2020-12-13 ENCOUNTER — Other Ambulatory Visit: Payer: Managed Care, Other (non HMO)

## 2020-12-13 DIAGNOSIS — I480 Paroxysmal atrial fibrillation: Secondary | ICD-10-CM

## 2020-12-13 DIAGNOSIS — K922 Gastrointestinal hemorrhage, unspecified: Secondary | ICD-10-CM

## 2020-12-13 LAB — BASIC METABOLIC PANEL
BUN/Creatinine Ratio: 19 (ref 9–20)
BUN: 16 mg/dL (ref 6–24)
CO2: 24 mmol/L (ref 20–29)
Calcium: 10.6 mg/dL — ABNORMAL HIGH (ref 8.7–10.2)
Chloride: 99 mmol/L (ref 96–106)
Creatinine, Ser: 0.85 mg/dL (ref 0.76–1.27)
Glucose: 222 mg/dL — ABNORMAL HIGH (ref 70–99)
Potassium: 4.9 mmol/L (ref 3.5–5.2)
Sodium: 140 mmol/L (ref 134–144)
eGFR: 101 mL/min/{1.73_m2} (ref >59)

## 2020-12-14 ENCOUNTER — Other Ambulatory Visit: Payer: Self-pay | Admitting: Internal Medicine

## 2020-12-14 DIAGNOSIS — E1159 Type 2 diabetes mellitus with other circulatory complications: Secondary | ICD-10-CM

## 2020-12-14 DIAGNOSIS — I25810 Atherosclerosis of coronary artery bypass graft(s) without angina pectoris: Secondary | ICD-10-CM

## 2020-12-14 MED ORDER — RYBELSUS 14 MG PO TABS: 14.0000 mg | ORAL_TABLET | Freq: Every day | ORAL | 0 refills | Status: DC

## 2020-12-16 ENCOUNTER — Encounter (HOSPITAL_COMMUNITY): Payer: Self-pay

## 2020-12-20 ENCOUNTER — Telehealth (HOSPITAL_COMMUNITY): Payer: Self-pay | Admitting: Emergency Medicine

## 2020-12-20 ENCOUNTER — Encounter (HOSPITAL_COMMUNITY): Payer: Self-pay

## 2020-12-20 NOTE — Telephone Encounter (Signed)
Reaching out to patient to offer assistance regarding upcoming cardiac imaging study; pt verbalizes understanding of appt date/time, parking situation and where to check in, pre-test NPO status and medications ordered, and verified current allergies; name and call back number provided for further questions should they arise Colton Bond RN Navigator Cardiac Imaging [facility] [phone] office [phone] cell  Denies iv issues Taking double metop suc (50mg  rather than usual 25mg )

## 2020-12-22 ENCOUNTER — Ambulatory Visit (HOSPITAL_COMMUNITY)
Admission: RE | Admit: 2020-12-22 | Discharge: 2020-12-22 | Disposition: A | Discharge status: Home / Self Care | Payer: Managed Care, Other (non HMO) | Source: Ambulatory Visit | Attending: Cardiology | Admitting: Cardiology

## 2020-12-22 ENCOUNTER — Other Ambulatory Visit: Payer: Self-pay

## 2020-12-22 DIAGNOSIS — I480 Paroxysmal atrial fibrillation: Secondary | ICD-10-CM | POA: Insufficient documentation

## 2020-12-22 DIAGNOSIS — K922 Gastrointestinal hemorrhage, unspecified: Secondary | ICD-10-CM | POA: Diagnosis present

## 2020-12-22 MED ORDER — IOHEXOL 350 MG/ML SOLN
95.0000 mL | Freq: Once | INTRAVENOUS | Status: AC | PRN
  Administered 2020-12-22: 95 mL via INTRAVENOUS

## 2020-12-22 NOTE — Progress Notes (Signed)
CT scan completed. Tolerated well. D/C home ambulatory. Awake and alert. In no distress 

## 2021-01-18 ENCOUNTER — Other Ambulatory Visit: Payer: Self-pay

## 2021-01-18 ENCOUNTER — Inpatient Hospital Stay: Payer: Managed Care, Other (non HMO) | Attending: Family

## 2021-01-18 ENCOUNTER — Inpatient Hospital Stay (HOSPITAL_BASED_OUTPATIENT_CLINIC_OR_DEPARTMENT_OTHER): Payer: Managed Care, Other (non HMO) | Admitting: Family

## 2021-01-18 ENCOUNTER — Encounter: Payer: Self-pay | Admitting: Family

## 2021-01-18 VITALS — BP 127/87 | HR 86 | Temp 98.3°F | Resp 17 | Ht 67.0 in | Wt 187.0 lb

## 2021-01-18 DIAGNOSIS — K922 Gastrointestinal hemorrhage, unspecified: Secondary | ICD-10-CM | POA: Insufficient documentation

## 2021-01-18 DIAGNOSIS — D500 Iron deficiency anemia secondary to blood loss (chronic): Secondary | ICD-10-CM | POA: Insufficient documentation

## 2021-01-18 LAB — CBC WITH DIFFERENTIAL (CANCER CENTER ONLY)
Abs Immature Granulocytes: 0.04 10*3/uL (ref 0.00–0.07)
Basophils Absolute: 0 10*3/uL (ref 0.0–0.1)
Basophils Relative: 0 %
Eosinophils Absolute: 0.1 10*3/uL (ref 0.0–0.5)
Eosinophils Relative: 1 %
HCT: 40.7 % (ref 39.0–52.0)
Hemoglobin: 13.1 g/dL (ref 13.0–17.0)
Immature Granulocytes: 0 %
Lymphocytes Relative: 28 %
Lymphs Abs: 2.8 10*3/uL (ref 0.7–4.0)
MCH: 26.7 pg (ref 26.0–34.0)
MCHC: 32.2 g/dL (ref 30.0–36.0)
MCV: 83.1 fL (ref 80.0–100.0)
Monocytes Absolute: 0.7 10*3/uL (ref 0.1–1.0)
Monocytes Relative: 7 %
Neutro Abs: 6.2 10*3/uL (ref 1.7–7.7)
Neutrophils Relative %: 64 %
Platelet Count: 168 10*3/uL (ref 150–400)
RBC: 4.9 MIL/uL (ref 4.22–5.81)
RDW: 16.8 % — ABNORMAL HIGH (ref 11.5–15.5)
WBC Count: 9.9 10*3/uL (ref 4.0–10.5)
nRBC: 0 % (ref 0.0–0.2)

## 2021-01-18 LAB — RETICULOCYTES
Immature Retic Fract: 21.2 % — ABNORMAL HIGH (ref 2.3–15.9)
RBC.: 4.8 MIL/uL (ref 4.22–5.81)
Retic Count, Absolute: 74.4 10*3/uL (ref 19.0–186.0)
Retic Ct Pct: 1.6 % (ref 0.4–3.1)

## 2021-01-18 NOTE — Progress Notes (Signed)
Hematology and Oncology Follow Up Visit  Colton Guzman [id number] Jan 30, 1961 60 y.o. 01/18/2021   Principle Diagnosis:  Iron deficiency anemia due to intermittent GI blood loss   Current Therapy:        IV iron as indicated    Interim History:  Colton Guzman is here today for follow-up. He is doing well and has no complaints at this time.  He notes occasional dizziness after taking his morning medications.  No falls or syncope to report.  No swelling, tenderness, numbness or tingling in his extremities.  No blood loss noted. No bruising or petechiae.  No fever, chills, n/v, cough, rash, dizziness, SOB, chest pain, palpitations, abdominal pain or changes in bowel or bladder habits.  He has maintained a good appetite and is staying well hydrated. His weight is stable at 187 lbs.   ECOG Performance Status: 1 - Symptomatic but completely ambulatory  Medications:  Allergies as of 01/18/2021   No Known Allergies      Medication List        Accurate as of January 18, 2021 11:34 AM. If you have any questions, ask your nurse or doctor.          ACCRUFeR 30 MG Caps Generic drug: Ferric Maltol Take 1 capsule by mouth in the morning and at bedtime.   acetaminophen 500 MG tablet Commonly known as: TYLENOL Take 1,000 mg by mouth every 4 (four) hours as needed (pain).   allopurinol 100 MG tablet Commonly known as: ZYLOPRIM TAKE 1 TABLET BY MOUTH EVERY DAY   apixaban 5 MG Tabs tablet Commonly known as: ELIQUIS Take 1 tablet (5 mg total) by mouth 2 (two) times daily.   atorvastatin 80 MG tablet Commonly known as: LIPITOR Take 1 tablet (80 mg total) by mouth daily.   ezetimibe 10 MG tablet Commonly known as: ZETIA Take 1 tablet (10 mg total) by mouth daily.   Farxiga 10 MG Tabs tablet Generic drug: dapagliflozin propanediol TAKE 1 TABLET BY MOUTH DAILY BEFORE BREAKFAST.   Gvoke HypoPen 2-Pack 1 MG/0.2ML Soaj Generic drug: Glucagon Inject 1 Act into the skin  daily as needed.   icosapent Ethyl 1 g capsule Commonly known as: Vascepa Take 2 capsules (2 g total) by mouth 2 (two) times daily.   isosorbide mononitrate 30 MG 24 hr tablet Commonly known as: IMDUR Take 1 tablet (30 mg total) by mouth daily.   losartan 50 MG tablet Commonly known as: COZAAR Take 1 tablet (50 mg total) by mouth daily.   Magnesium 300 MG Caps Take 300 mg by mouth daily.   metFORMIN 1000 MG tablet Commonly known as: GLUCOPHAGE Take 1 tablet (1,000 mg total) by mouth 2 (two) times daily with a meal.   metoprolol succinate 25 MG 24 hr tablet Commonly known as: Toprol XL Take 1 tablet (25 mg total) by mouth daily.   multivitamin with minerals Tabs tablet Take 1 tablet by mouth daily.   pantoprazole 40 MG tablet Commonly known as: Protonix Please take 40 mg oral twice daily x30 days, then 40 mg oral daily.   Rybelsus 14 MG Tabs Generic drug: Semaglutide Take 14 mg by mouth daily.   traMADol 50 MG tablet Commonly known as: Ultram Take 1 tablet (50 mg total) by mouth every 6 (six) hours as needed.   Vitamin D (Ergocalciferol) 1.25 MG (50000 UNIT) Caps capsule Commonly known as: DRISDOL Take 50,000 Units by mouth every 7 (seven) days. Tuesdays  Allergies: No Known Allergies  Past Medical History, Surgical history, Social history, and Family History were reviewed and updated.  Review of Systems: All other 10 point review of systems is negative.   Physical Exam:  vitals were not taken for this visit.   Wt Readings from Last 3 Encounters:  12/02/20 186 lb (84.4 kg)  10/18/20 188 lb (85.3 kg)  09/08/20 186 lb (84.4 kg)    Ocular: Sclerae unicteric, pupils equal, round and reactive to light Ear-nose-throat: Oropharynx clear, dentition fair Lymphatic: No cervical or supraclavicular adenopathy Lungs no rales or rhonchi, good excursion bilaterally Heart regular rate and rhythm, no murmur appreciated Abd soft, nontender, positive bowel  sounds MSK no focal spinal tenderness, no joint edema Neuro: non-focal, well-oriented, appropriate affect Breasts: Deferred   Lab Results  Component Value Date   WBC 9.9 01/18/2021   HGB 13.1 01/18/2021   HCT 40.7 01/18/2021   MCV 83.1 01/18/2021   PLT 168 01/18/2021   Lab Results  Component Value Date   FERRITIN 22 (L) 10/18/2020   IRON 29 (L) 10/18/2020   TIBC 416 (H) 10/18/2020   UIBC 387 (H) 10/18/2020   IRONPCTSAT 7 (L) 10/18/2020   Lab Results  Component Value Date   RETICCTPCT 1.6 01/18/2021   RBC 4.90 01/18/2021   RBC 4.80 01/18/2021   No results found for: KPAFRELGTCHN, LAMBDASER, KAPLAMBRATIO No results found for: IGGSERUM, IGA, IGMSERUM No results found for: Odetta Pink, SPEI   Chemistry      Component Value Date/Time   NA 140 12/13/2020 1144   K 4.9 12/13/2020 1144   CL 99 12/13/2020 1144   CO2 24 12/13/2020 1144   BUN 16 12/13/2020 1144   CREATININE 0.85 12/13/2020 1144   CREATININE 0.84 09/06/2020 1454      Component Value Date/Time   CALCIUM 10.6 (H) 12/13/2020 1144   ALKPHOS 82 09/06/2020 1454   AST 18 09/06/2020 1454   ALT 21 09/06/2020 1454   BILITOT 0.4 09/06/2020 1454       Impression and Plan: Colton Guzman. is a very pleasant 60 yo Filipino gentleman with recent diagnosis of iron deficiency secondary to acute GI blood loss.  Iron studies are pending. We will replace if needed.  Follow-up in 4 months.   Lottie Dawson, NP 11/30/202211:34 AM

## 2021-01-19 ENCOUNTER — Telehealth: Payer: Self-pay

## 2021-01-19 LAB — IRON AND TIBC
Iron: 39 ug/dL — ABNORMAL LOW (ref 42–163)
Saturation Ratios: 9 % — ABNORMAL LOW (ref 20–55)
TIBC: 408 ug/dL (ref 202–409)
UIBC: 369 ug/dL (ref 117–376)

## 2021-01-19 LAB — FERRITIN: Ferritin: 20 ng/mL — ABNORMAL LOW (ref 24–336)

## 2021-01-19 NOTE — Telephone Encounter (Signed)
-----   Message from Vickie Epley, MD sent at 12/29/2020  9:45 AM EST ----- Let's plan to discuss this case at the next [place]. Will be challenging anatomy.  Lysbeth Galas T. Quentin Ore, MD, [facility], [facility] Cardiac Electrophysiology

## 2021-01-19 NOTE — Telephone Encounter (Signed)
Reviewed results with patient who verbalized understanding.   Scheduled the patient for MyChart visit with Dr. Quentin Ore 01/26/2021. He was grateful for call and agrees with plan.     Patient Consent for Virtual Visit        [facility] Montford Barg. has provided verbal consent on 01/19/2021 for a virtual visit (video or telephone).   CONSENT FOR VIRTUAL VISIT FOR:  [place].  By participating in this virtual visit I agree to the following:  I hereby voluntarily request, consent and authorize [facility] and its employed or contracted physicians, physician assistants, nurse practitioners or other licensed health care professionals (the Practitioner), to provide me with telemedicine health care services (the "Services") as deemed necessary by the treating Practitioner. I acknowledge and consent to receive the Services by the Practitioner via telemedicine. I understand that the telemedicine visit will involve communicating with the Practitioner through live audiovisual communication technology and the disclosure of certain medical information by electronic transmission. I acknowledge that I have been given the opportunity to request an in-person assessment or other available alternative prior to the telemedicine visit and am voluntarily participating in the telemedicine visit.  I understand that I have the right to withhold or withdraw my consent to the use of telemedicine in the course of my care at any time, without affecting my right to future care or treatment, and that the Practitioner or I may terminate the telemedicine visit at any time. I understand that I have the right to inspect all information obtained and/or recorded in the course of the telemedicine visit and may receive copies of available information for a reasonable fee.  I understand that some of the potential risks of receiving the Services via telemedicine include:  Delay or interruption in medical evaluation due to  technological equipment failure or disruption; Information transmitted may not be sufficient (e.g. poor resolution of images) to allow for appropriate medical decision making by the Practitioner; and/or  In rare instances, security protocols could fail, causing a breach of personal health information.  Furthermore, I acknowledge that it is my responsibility to provide information about my medical history, conditions and care that is complete and accurate to the best of my ability. I acknowledge that Practitioner's advice, recommendations, and/or decision may be based on factors not within their control, such as incomplete or inaccurate data provided by me or distortions of diagnostic images or specimens that may result from electronic transmissions. I understand that the practice of medicine is not an exact science and that Practitioner makes no warranties or guarantees regarding treatment outcomes. I acknowledge that a copy of this consent can be made available to me via my patient portal ([facility]), or I can request a printed copy by calling the office of [facility].    I understand that my insurance will be billed for this visit.   I have read or had this consent read to me. I understand the contents of this consent, which adequately explains the benefits and risks of the Services being provided via telemedicine.  I have been provided ample opportunity to ask questions regarding this consent and the Services and have had my questions answered to my satisfaction. I give my informed consent for the services to be provided through the use of telemedicine in my medical care

## 2021-01-20 ENCOUNTER — Telehealth: Payer: Self-pay | Admitting: Family

## 2021-01-20 NOTE — Telephone Encounter (Signed)
Scheduled appt per 11/30 los - called patient and left message for patient with appt date and time.

## 2021-01-25 ENCOUNTER — Telehealth: Payer: Self-pay | Admitting: Family

## 2021-01-26 ENCOUNTER — Encounter: Payer: Self-pay | Admitting: Cardiology

## 2021-01-26 ENCOUNTER — Other Ambulatory Visit: Payer: Self-pay

## 2021-01-26 ENCOUNTER — Telehealth (INDEPENDENT_AMBULATORY_CARE_PROVIDER_SITE_OTHER): Payer: Managed Care, Other (non HMO) | Admitting: Cardiology

## 2021-01-26 VITALS — BP 124/84 | Ht 67.0 in | Wt 180.0 lb

## 2021-01-26 DIAGNOSIS — K922 Gastrointestinal hemorrhage, unspecified: Secondary | ICD-10-CM

## 2021-01-26 DIAGNOSIS — I480 Paroxysmal atrial fibrillation: Secondary | ICD-10-CM | POA: Diagnosis not present

## 2021-01-26 DIAGNOSIS — I2510 Atherosclerotic heart disease of native coronary artery without angina pectoris: Secondary | ICD-10-CM | POA: Diagnosis not present

## 2021-01-26 NOTE — Progress Notes (Signed)
Virtual Visit via Telephone Note   This visit type was conducted due to national recommendations for restrictions regarding the COVID-19 Pandemic (e.g. social distancing) in an effort to limit this patient's exposure and mitigate transmission in our community.  Due to his co-morbid illnesses, this patient is at least at moderate risk for complications without adequate follow up.  This format is felt to be most appropriate for this patient at this time.  The patient did not have access to video technology/had technical difficulties with video requiring transitioning to audio format only (telephone).  All issues noted in this document were discussed and addressed.  No physical exam could be performed with this format.  Please refer to the patient's chart for his  consent to telehealth for [facility].    Date:  01/26/2021   ID:  Colton Neptune Bettey Mare., DOB [birth year], MRN [id number] The patient was identified using 2 identifiers.  Patient Location: Home Provider Location: Office/Clinic   PCP:  [name], MD   [facility] Providers Cardiologist:  Freada Bergeron, MD Electrophysiologist:  Vickie Epley, MD   Evaluation Performed:  Follow-Up Visit  Chief Complaint: Atrial fibrillation  History of Present Illness:    Colton Mom. is a 60 y.o. male with atrial fibrillation who I am seeing in follow-up.  I last saw the patient December 02, 2020.  He has a history of significant GI bleeding.  He required large volume transfusion of 6 units of PRBCs.  Given his atrial fibrillation history and GI bleeding he was referred to me to discuss watchman.  We ordered the prewatchman implant CT scan which showed very difficult anatomy with a very shallow landing zone for the watchman device.  After three-dimensional reconstruction of the appendage, I did not think it was a good idea to proceed with watchman implant.  I think there would be other left atrial appendage occlusion  devices that would be better suited to close his left atrial appendage.  Today he tells me he has been doing well.  I discussed the above news with him and he was interested in looking up more information about the other device (amulet).  I will plan to follow-up with him on an as-needed basis.  We will keep him on a list of potential candidates for the amulet device when it becomes available at [facility].  The patient does not have symptoms concerning for COVID-19 infection (fever, chills, cough, or new shortness of breath).    Past Medical History:  Diagnosis Date   CAD (coronary artery disease) of artery bypass graft 02/2020   Diabetes mellitus without complication (HCC)    GI bleed    Hyperlipidemia LDL goal <70    Hypertension    PAF (paroxysmal atrial fibrillation) [facility])    Past Surgical History:  Procedure Laterality Date   BIOPSY  08/17/2020   Procedure: BIOPSY;  Surgeon: Mauri Pole, MD;  Location: [place];  Service: Endoscopy;;   COLONOSCOPY WITH PROPOFOL Left 02/27/2020   Procedure: COLONOSCOPY WITH PROPOFOL;  Surgeon: Otis Brace, MD;  Location: [facility] ENDOSCOPY;  Service: Gastroenterology;  Laterality: Left;   COLONOSCOPY WITH PROPOFOL N/A 08/13/2020   Procedure: COLONOSCOPY WITH PROPOFOL;  Surgeon: Gatha Mayer, MD;  Location: [facility] ENDOSCOPY;  Service: Endoscopy;  Laterality: N/A;   CORONARY ARTERY BYPASS GRAFT N/A 03/08/2020   Procedure: CORONARY ARTERY BYPASS GRAFTING (CABG) x 5 ON PUMP;  Surgeon: Wonda Olds, MD;  Location: [place];  Service: Open  Heart Surgery;  Laterality: N/A;   ENDOVEIN HARVEST OF GREATER SAPHENOUS VEIN Right 03/08/2020   Procedure: ENDOVEIN HARVEST OF GREATER SAPHENOUS VEIN;  Surgeon: Wonda Olds, MD;  Location: [place];  Service: Open Heart Surgery;  Laterality: Right;   ESOPHAGOGASTRODUODENOSCOPY (EGD) WITH PROPOFOL N/A 02/27/2020   Procedure: ESOPHAGOGASTRODUODENOSCOPY (EGD) WITH PROPOFOL;  Surgeon: Otis Brace, MD;   Location: [facility] ENDOSCOPY;  Service: Gastroenterology;  Laterality: N/A;   ESOPHAGOGASTRODUODENOSCOPY (EGD) WITH PROPOFOL N/A 08/17/2020   Procedure: ESOPHAGOGASTRODUODENOSCOPY (EGD) WITH PROPOFOL;  Surgeon: Mauri Pole, MD;  Location: [facility] ENDOSCOPY;  Service: Endoscopy;  Laterality: N/A;   LEFT HEART CATH AND CORONARY ANGIOGRAPHY N/A 03/04/2020   Procedure: LEFT HEART CATH AND CORONARY ANGIOGRAPHY;  Surgeon: Burnell Blanks, MD;  Location: [place] CV LAB;  Service: Cardiovascular;  Laterality: N/A;   RADIAL ARTERY HARVEST Left 03/08/2020   Procedure: RADIAL ARTERY HARVEST;  Surgeon: Wonda Olds, MD;  Location: [place];  Service: Open Heart Surgery;  Laterality: Left;   TEE WITHOUT CARDIOVERSION N/A 03/08/2020   Procedure: TRANSESOPHAGEAL ECHOCARDIOGRAM (TEE);  Surgeon: Wonda Olds, MD;  Location: [place];  Service: Open Heart Surgery;  Laterality: N/A;     No outpatient medications have been marked as taking for the 01/26/21 encounter (Appointment) with Vickie Epley, MD.     Allergies:   Patient has no known allergies.   Social History   Tobacco Use   Smoking status: Former    Packs/day: 1.50    Types: Cigarettes    Quit date: 2012    Years since quitting: 10.9   Smokeless tobacco: Never  Vaping Use   Vaping Use: Former   Quit date: 08/28/2010  Substance Use Topics   Alcohol use: Yes     Family Hx: The patient's family history is not on file.  ROS:   Please see the history of present illness.     All other systems reviewed and are negative.   Prior CV studies:   The following studies were reviewed today:  CT scan  Labs/Other Tests and Data Reviewed:    EKG: No new EKGs  Recent Labs: 02/23/2020: TSH 1.033 07/26/2020: Pro B Natriuretic peptide (BNP) 37.0 08/17/2020: B Natriuretic Peptide 128.6; Magnesium 1.9 09/06/2020: ALT 21 12/13/2020: BUN 16; Creatinine, Ser 0.85; Potassium 4.9; Sodium 140 01/18/2021: Hemoglobin 13.1; Platelet Count 168    Recent Lipid Panel Lab Results  Component Value Date/Time   CHOL 121 10/25/2020 08:33 AM   TRIG 204 (H) 10/25/2020 08:33 AM   HDL 28 (L) 10/25/2020 08:33 AM   CHOLHDL 4.3 10/25/2020 08:33 AM   CHOLHDL 4 07/26/2020 11:44 AM   LDLCALC 59 10/25/2020 08:33 AM   LDLDIRECT 93.0 07/26/2020 11:44 AM    Wt Readings from Last 3 Encounters:  01/18/21 187 lb (84.8 kg)  12/02/20 186 lb (84.4 kg)  10/18/20 188 lb (85.3 kg)        Objective:    Vital Signs:  There were no vitals taken for this visit.   VITAL SIGNS:  reviewed  ASSESSMENT & PLAN:    #Paroxysmal atrial fibrillation #Severe GI bleeding  He is back on Eliquis and tolerating it for now.  I do think a long-term strategy for stroke risk mitigation that avoids long-term exposure anticoagulation would be best given his history of severe GI bleeding.  The watchman is not can be the best device for him at this time because of this shallow landing zone in his left atrial appendage.  I  think a device such as the amulet may be a better option.  This device is not yet available at [facility].  I did discuss this with the patient during today's visit and told him that we would keep him on a list of potential candidates for the amulet device when it becomes available at our hospital.  He will do some research on his own.  He continues to follow with Dr. Johney Frame in cardiology.  I will be available on an as-needed basis.  #Coronary artery disease No ischemic symptoms today continue current therapy  Follow-up as needed with EP      Time:   Today, I have spent 10 minutes with the patient with telehealth technology discussing the above problems.     Medication Adjustments/Labs and Tests Ordered: Current medicines are reviewed at length with the patient today.  Concerns regarding medicines are outlined above.   Tests Ordered: No orders of the defined types were placed in this encounter.   Medication Changes: No orders of the defined  types were placed in this encounter.   Signed, Vickie Epley, MD  01/26/2021 2:24 PM    [place]

## 2021-01-31 ENCOUNTER — Inpatient Hospital Stay: Payer: Managed Care, Other (non HMO) | Attending: Family

## 2021-01-31 ENCOUNTER — Other Ambulatory Visit: Payer: Self-pay

## 2021-01-31 VITALS — BP 100/57 | HR 71 | Temp 98.6°F | Resp 18

## 2021-01-31 DIAGNOSIS — K922 Gastrointestinal hemorrhage, unspecified: Secondary | ICD-10-CM | POA: Insufficient documentation

## 2021-01-31 DIAGNOSIS — D500 Iron deficiency anemia secondary to blood loss (chronic): Secondary | ICD-10-CM | POA: Diagnosis not present

## 2021-01-31 MED ORDER — SODIUM CHLORIDE 0.9 % IV SOLN: Freq: Once | INTRAVENOUS | Status: AC

## 2021-01-31 MED ORDER — SODIUM CHLORIDE 0.9 % IV SOLN
125.0000 mg | Freq: Once | INTRAVENOUS | Status: AC
  Administered 2021-01-31: 125 mg via INTRAVENOUS
  Filled 2021-01-31: qty 125

## 2021-01-31 NOTE — Patient Instructions (Signed)
Sodium Ferric Gluconate Complex Injection What is this medication? SODIUM FERRIC GLUCONATE COMPLEX (SOE dee um FER ik GLOO koe nate KOM pleks) treats low levels of iron (iron deficiency anemia) in people with kidney disease. Iron is a mineral that plays an important role in making red blood cells, which carry oxygen from your lungs to the rest of your body. This medicine may be used for other purposes; ask your health care provider or pharmacist if you have questions. COMMON BRAND NAME(S): Ferrlecit, Nulecit What should I tell my care team before I take this medication? They need to know if you have any of the following conditions: Anemia that is not from iron deficiency High levels of iron in the blood An unusual or allergic reaction to iron, other medications, foods, dyes, or preservatives Pregnant or are trying to become pregnant Breast-feeding How should I use this medication? This medication is injected into a vein. It is given by your care team in a hospital or clinic setting. Talk to your care team about the use of this medication in children. While it may be prescribed for children as young as 6 years for selected conditions, precautions do apply. Overdosage: If you think you have taken too much of this medicine contact a poison control center or emergency room at once. NOTE: This medicine is only for you. Do not share this medicine with others. What if I miss a dose? It is important not to miss your dose. Call your care team if you are unable to keep an appointment. What may interact with this medication? Do not take this medication with any of the following: Deferasirox Deferoxamine Dimercaprol This medication may also interact with the following: Other iron products This list may not describe all possible interactions. Give your health care provider a list of all the medicines, herbs, non-prescription drugs, or dietary supplements you use. Also tell them if you smoke, drink  alcohol, or use illegal drugs. Some items may interact with your medicine. What should I watch for while using this medication? Your condition will be monitored carefully while you are receiving this medication. Visit your care team for regular checks on your progress. You may need blood work while you are taking this medication. What side effects may I notice from receiving this medication? Side effects that you should report to your care team as soon as possible: Allergic reactions-skin rash, itching, hives, swelling of the face, lips, tongue, or throat Low blood pressure-dizziness, feeling faint or lightheaded, blurry vision Shortness of breath Side effects that usually do not require medical attention (report to your care team if they continue or are bothersome): Flushing Headache Joint pain Muscle pain Nausea Pain, redness, or irritation at injection site This list may not describe all possible side effects. Call your doctor for medical advice about side effects. You may report side effects to FDA at 1-800-FDA-1088. Where should I keep my medication? This medication is given in a hospital or clinic and will not be stored at home. NOTE: This sheet is a summary. It may not cover all possible information. If you have questions about this medicine, talk to your doctor, pharmacist, or health care provider.  2022 Elsevier/Gold Standard (2020-03-21 11:46:34)  

## 2021-02-06 ENCOUNTER — Other Ambulatory Visit: Payer: Self-pay

## 2021-02-06 ENCOUNTER — Other Ambulatory Visit: Payer: Self-pay | Admitting: Internal Medicine

## 2021-02-06 ENCOUNTER — Other Ambulatory Visit: Payer: Self-pay | Admitting: Physician Assistant

## 2021-02-06 DIAGNOSIS — Z79899 Other long term (current) drug therapy: Secondary | ICD-10-CM

## 2021-02-06 DIAGNOSIS — E785 Hyperlipidemia, unspecified: Secondary | ICD-10-CM

## 2021-02-06 DIAGNOSIS — E782 Mixed hyperlipidemia: Secondary | ICD-10-CM

## 2021-02-06 DIAGNOSIS — E1159 Type 2 diabetes mellitus with other circulatory complications: Secondary | ICD-10-CM

## 2021-02-06 DIAGNOSIS — I2510 Atherosclerotic heart disease of native coronary artery without angina pectoris: Secondary | ICD-10-CM

## 2021-02-06 DIAGNOSIS — Z951 Presence of aortocoronary bypass graft: Secondary | ICD-10-CM

## 2021-02-06 DIAGNOSIS — M1A09X0 Idiopathic chronic gout, multiple sites, without tophus (tophi): Secondary | ICD-10-CM

## 2021-02-06 MED ORDER — ICOSAPENT ETHYL 1 G PO CAPS: 2.0000 g | ORAL_CAPSULE | Freq: Two times a day (BID) | ORAL | 2 refills | Status: DC

## 2021-02-06 MED ORDER — EZETIMIBE 10 MG PO TABS: 10.0000 mg | ORAL_TABLET | Freq: Every day | ORAL | 2 refills | Status: DC

## 2021-02-06 NOTE — Addendum Note (Signed)
Addended by: Carter Kitten D on: 02/06/2021 01:22 PM   Modules accepted: Orders

## 2021-02-07 ENCOUNTER — Other Ambulatory Visit: Payer: Self-pay

## 2021-02-07 ENCOUNTER — Inpatient Hospital Stay: Payer: Managed Care, Other (non HMO)

## 2021-02-07 VITALS — BP 138/82 | HR 87 | Temp 98.2°F | Resp 18

## 2021-02-07 DIAGNOSIS — D500 Iron deficiency anemia secondary to blood loss (chronic): Secondary | ICD-10-CM | POA: Diagnosis not present

## 2021-02-07 MED ORDER — SODIUM CHLORIDE 0.9 % IV SOLN
125.0000 mg | Freq: Once | INTRAVENOUS | Status: AC
  Administered 2021-02-07: 12:00:00 125 mg via INTRAVENOUS
  Filled 2021-02-07: qty 10

## 2021-02-07 MED ORDER — SODIUM CHLORIDE 0.9 % IV SOLN: Freq: Once | INTRAVENOUS | Status: AC

## 2021-02-07 NOTE — Patient Instructions (Signed)
Sodium Ferric Gluconate Complex Injection What is this medication? SODIUM FERRIC GLUCONATE COMPLEX (SOE dee um FER ik GLOO koe nate KOM pleks) treats low levels of iron (iron deficiency anemia) in people with kidney disease. Iron is a mineral that plays an important role in making red blood cells, which carry oxygen from your lungs to the rest of your body. This medicine may be used for other purposes; ask your health care provider or pharmacist if you have questions. COMMON BRAND NAME(S): Ferrlecit, Nulecit What should I tell my care team before I take this medication? They need to know if you have any of the following conditions: Anemia that is not from iron deficiency High levels of iron in the blood An unusual or allergic reaction to iron, other medications, foods, dyes, or preservatives Pregnant or are trying to become pregnant Breast-feeding How should I use this medication? This medication is injected into a vein. It is given by your care team in a hospital or clinic setting. Talk to your care team about the use of this medication in children. While it may be prescribed for children as young as 6 years for selected conditions, precautions do apply. Overdosage: If you think you have taken too much of this medicine contact a poison control center or emergency room at once. NOTE: This medicine is only for you. Do not share this medicine with others. What if I miss a dose? It is important not to miss your dose. Call your care team if you are unable to keep an appointment. What may interact with this medication? Do not take this medication with any of the following: Deferasirox Deferoxamine Dimercaprol This medication may also interact with the following: Other iron products This list may not describe all possible interactions. Give your health care provider a list of all the medicines, herbs, non-prescription drugs, or dietary supplements you use. Also tell them if you smoke, drink  alcohol, or use illegal drugs. Some items may interact with your medicine. What should I watch for while using this medication? Your condition will be monitored carefully while you are receiving this medication. Visit your care team for regular checks on your progress. You may need blood work while you are taking this medication. What side effects may I notice from receiving this medication? Side effects that you should report to your care team as soon as possible: Allergic reactions-skin rash, itching, hives, swelling of the face, lips, tongue, or throat Low blood pressure-dizziness, feeling faint or lightheaded, blurry vision Shortness of breath Side effects that usually do not require medical attention (report to your care team if they continue or are bothersome): Flushing Headache Joint pain Muscle pain Nausea Pain, redness, or irritation at injection site This list may not describe all possible side effects. Call your doctor for medical advice about side effects. You may report side effects to FDA at 1-800-FDA-1088. Where should I keep my medication? This medication is given in a hospital or clinic and will not be stored at home. NOTE: This sheet is a summary. It may not cover all possible information. If you have questions about this medicine, talk to your doctor, pharmacist, or health care provider.  2022 Elsevier/Gold Standard (2020-03-21 11:46:34)  

## 2021-03-14 ENCOUNTER — Other Ambulatory Visit: Payer: Self-pay | Admitting: Internal Medicine

## 2021-03-14 ENCOUNTER — Other Ambulatory Visit: Payer: Self-pay

## 2021-03-14 ENCOUNTER — Other Ambulatory Visit: Payer: Self-pay | Admitting: Physician Assistant

## 2021-03-14 DIAGNOSIS — E785 Hyperlipidemia, unspecified: Secondary | ICD-10-CM

## 2021-03-14 DIAGNOSIS — E1159 Type 2 diabetes mellitus with other circulatory complications: Secondary | ICD-10-CM

## 2021-03-14 DIAGNOSIS — I2510 Atherosclerotic heart disease of native coronary artery without angina pectoris: Secondary | ICD-10-CM

## 2021-03-14 DIAGNOSIS — I25810 Atherosclerosis of coronary artery bypass graft(s) without angina pectoris: Secondary | ICD-10-CM

## 2021-03-14 DIAGNOSIS — M1A09X0 Idiopathic chronic gout, multiple sites, without tophus (tophi): Secondary | ICD-10-CM

## 2021-03-14 MED ORDER — LOSARTAN POTASSIUM 50 MG PO TABS: 50.0000 mg | ORAL_TABLET | Freq: Every day | ORAL | 3 refills | Status: DC

## 2021-03-28 ENCOUNTER — Ambulatory Visit (INDEPENDENT_AMBULATORY_CARE_PROVIDER_SITE_OTHER): Payer: Managed Care, Other (non HMO) | Admitting: Internal Medicine

## 2021-03-28 ENCOUNTER — Encounter: Payer: Self-pay | Admitting: Internal Medicine

## 2021-03-28 ENCOUNTER — Other Ambulatory Visit: Payer: Self-pay

## 2021-03-28 VITALS — BP 134/78 | HR 80 | Temp 98.4°F | Resp 16 | Ht 67.0 in | Wt 184.0 lb

## 2021-03-28 DIAGNOSIS — Z0000 Encounter for general adult medical examination without abnormal findings: Secondary | ICD-10-CM | POA: Diagnosis not present

## 2021-03-28 DIAGNOSIS — Z0001 Encounter for general adult medical examination with abnormal findings: Secondary | ICD-10-CM | POA: Insufficient documentation

## 2021-03-28 DIAGNOSIS — D510 Vitamin B12 deficiency anemia due to intrinsic factor deficiency: Secondary | ICD-10-CM | POA: Diagnosis not present

## 2021-03-28 DIAGNOSIS — M1A09X0 Idiopathic chronic gout, multiple sites, without tophus (tophi): Secondary | ICD-10-CM | POA: Diagnosis not present

## 2021-03-28 DIAGNOSIS — I10 Essential (primary) hypertension: Secondary | ICD-10-CM | POA: Diagnosis not present

## 2021-03-28 DIAGNOSIS — Z23 Encounter for immunization: Secondary | ICD-10-CM

## 2021-03-28 DIAGNOSIS — D500 Iron deficiency anemia secondary to blood loss (chronic): Secondary | ICD-10-CM | POA: Diagnosis not present

## 2021-03-28 DIAGNOSIS — E119 Type 2 diabetes mellitus without complications: Secondary | ICD-10-CM

## 2021-03-28 DIAGNOSIS — E1159 Type 2 diabetes mellitus with other circulatory complications: Secondary | ICD-10-CM

## 2021-03-28 DIAGNOSIS — N529 Male erectile dysfunction, unspecified: Secondary | ICD-10-CM | POA: Insufficient documentation

## 2021-03-28 DIAGNOSIS — Z794 Long term (current) use of insulin: Secondary | ICD-10-CM

## 2021-03-28 DIAGNOSIS — K4090 Unilateral inguinal hernia, without obstruction or gangrene, not specified as recurrent: Secondary | ICD-10-CM

## 2021-03-28 LAB — HEMOGLOBIN A1C: Hgb A1c MFr Bld: 9.9 % — ABNORMAL HIGH (ref 4.6–6.5)

## 2021-03-28 LAB — CBC WITH DIFFERENTIAL/PLATELET
Basophils Absolute: 0 10*3/uL (ref 0.0–0.1)
Basophils Relative: 0.7 % (ref 0.0–3.0)
Eosinophils Absolute: 0.1 10*3/uL (ref 0.0–0.7)
Eosinophils Relative: 1.7 % (ref 0.0–5.0)
HCT: 43 % (ref 39.0–52.0)
Hemoglobin: 14.3 g/dL (ref 13.0–17.0)
Lymphocytes Relative: 32.5 % (ref 12.0–46.0)
Lymphs Abs: 1.9 10*3/uL (ref 0.7–4.0)
MCHC: 33.3 g/dL (ref 30.0–36.0)
MCV: 87.4 fl (ref 78.0–100.0)
Monocytes Absolute: 0.3 10*3/uL (ref 0.1–1.0)
Monocytes Relative: 5.1 % (ref 3.0–12.0)
Neutro Abs: 3.4 10*3/uL (ref 1.4–7.7)
Neutrophils Relative %: 60 % (ref 43.0–77.0)
Platelets: 165 10*3/uL (ref 150.0–400.0)
RBC: 4.92 Mil/uL (ref 4.22–5.81)
RDW: 17.6 % — ABNORMAL HIGH (ref 11.5–15.5)
WBC: 5.7 10*3/uL (ref 4.0–10.5)

## 2021-03-28 LAB — URIC ACID: Uric Acid, Serum: 6.6 mg/dL (ref 4.0–7.8)

## 2021-03-28 LAB — MICROALBUMIN / CREATININE URINE RATIO
Creatinine,U: 36.9 mg/dL
Microalb Creat Ratio: 3.6 mg/g (ref 0.0–30.0)
Microalb, Ur: 1.3 mg/dL (ref 0.0–1.9)

## 2021-03-28 LAB — PSA: PSA: 0.13 ng/mL (ref 0.10–4.00)

## 2021-03-28 LAB — BASIC METABOLIC PANEL
BUN: 17 mg/dL (ref 6–23)
CO2: 31 mEq/L (ref 19–32)
Calcium: 10.2 mg/dL (ref 8.4–10.5)
Chloride: 97 mEq/L (ref 96–112)
Creatinine, Ser: 0.83 mg/dL (ref 0.40–1.50)
GFR: 96.14 mL/min (ref >60.00)
Glucose, Bld: 206 mg/dL — ABNORMAL HIGH (ref 70–99)
Potassium: 4.5 mEq/L (ref 3.5–5.1)
Sodium: 137 mEq/L (ref 135–145)

## 2021-03-28 LAB — FOLATE: Folate: >24.2 ng/mL (ref >5.9)

## 2021-03-28 LAB — IBC + FERRITIN
Ferritin: 23.2 ng/mL (ref 22.0–322.0)
Iron: 66 ug/dL (ref 42–165)
Saturation Ratios: 14.9 % — ABNORMAL LOW (ref 20.0–50.0)
TIBC: 443.8 ug/dL (ref 250.0–450.0)
Transferrin: 317 mg/dL (ref 212.0–360.0)

## 2021-03-28 LAB — VITAMIN B12: Vitamin B-12: 254 pg/mL (ref 211–911)

## 2021-03-28 LAB — TSH: TSH: 1.31 u[IU]/mL (ref 0.35–5.50)

## 2021-03-28 MED ORDER — FREESTYLE LIBRE 2 READER DEVI: 1.0000 | Freq: Every day | 5 refills | Status: DC

## 2021-03-28 MED ORDER — INSULIN PEN NEEDLE 32G X 6 MM MISC: 1.0000 | Freq: Every day | 1 refills | Status: DC

## 2021-03-28 MED ORDER — TRESIBA FLEXTOUCH 200 UNIT/ML ~~LOC~~ SOPN: 50.0000 [IU] | PEN_INJECTOR | Freq: Every day | SUBCUTANEOUS | 1 refills | Status: DC

## 2021-03-28 MED ORDER — FREESTYLE LIBRE 2 SENSOR MISC: 1.0000 | Freq: Every day | 5 refills | Status: DC

## 2021-03-28 NOTE — Patient Instructions (Signed)

## 2021-03-28 NOTE — Progress Notes (Signed)
Subjective:  Patient ID: Colton Guzman., male    DOB: 02-Jan-1961  Age: 61 y.o. MRN: [id number]  CC: Annual Exam  This visit occurred during the SARS-CoV-2 public health emergency.  Safety protocols were in place, including screening questions prior to the visit, additional usage of staff PPE, and extensive cleaning of exam room while observing appropriate contact time as indicated for disinfecting solutions.    HPI [facility]. presents for a CPX and f/up -   He does not monitor his blood sugar. He denies polys. He is active and denies CP, SOB, edema, palpitations, or fatigue.  Outpatient Medications Prior to Visit  Medication Sig Dispense Refill   acetaminophen (TYLENOL) 500 MG tablet Take 1,000 mg by mouth every 4 (four) hours as needed (pain).     allopurinol (ZYLOPRIM) 100 MG tablet TAKE 1 TABLET BY MOUTH EVERY DAY 90 tablet 0   amLODipine (NORVASC) 10 MG tablet 1 tablet     apixaban (ELIQUIS) 5 MG TABS tablet Take 1 tablet (5 mg total) by mouth 2 (two) times daily. 60 tablet 5   ASPIRIN LOW DOSE 81 MG EC tablet Take 81 mg by mouth daily.     atorvastatin (LIPITOR) 80 MG tablet TAKE 1 TABLET BY MOUTH EVERY DAY 90 tablet 0   ezetimibe (ZETIA) 10 MG tablet Take 1 tablet (10 mg total) by mouth daily. 90 tablet 2   FARXIGA 10 MG TABS tablet TAKE 1 TABLET BY MOUTH EVERY DAY BEFORE BREAKFAST 90 tablet 0   Ferric Maltol (ACCRUFER) 30 MG CAPS Take 1 capsule by mouth in the morning and at bedtime. 180 capsule 1   Glucagon (GVOKE HYPOPEN 2-PACK) 1 MG/0.2ML SOAJ Inject 1 Act into the skin daily as needed. 2 mL 5   icosapent Ethyl (VASCEPA) 1 g capsule Take 2 capsules (2 g total) by mouth 2 (two) times daily. 360 capsule 2   losartan (COZAAR) 50 MG tablet Take 1 tablet (50 mg total) by mouth daily. 90 tablet 3   Magnesium 300 MG CAPS Take 300 mg by mouth daily.     metFORMIN (GLUCOPHAGE) 1000 MG tablet TAKE 1 TABLET (1,000 MG TOTAL) BY MOUTH 2 (TWO) TIMES DAILY WITH A MEAL.  180 tablet 0   Multiple Vitamin (MULTIVITAMIN WITH MINERALS) TABS tablet Take 1 tablet by mouth daily.     pantoprazole (PROTONIX) 40 MG tablet Please take 40 mg oral twice daily x30 days, then 40 mg oral daily. 90 tablet 0   RYBELSUS 14 MG TABS TAKE 1 TABLET BY MOUTH EVERY DAY 90 tablet 0   traMADol (ULTRAM) 50 MG tablet Take 1 tablet (50 mg total) by mouth every 6 (six) hours as needed. 90 tablet 3   Vitamin D, Ergocalciferol, (DRISDOL) 1.25 MG (50000 UNIT) CAPS capsule Take 50,000 Units by mouth every 7 (seven) days. Tuesdays     isosorbide mononitrate (IMDUR) 30 MG 24 hr tablet Take 1 tablet (30 mg total) by mouth daily. 90 tablet 3   metoprolol succinate (TOPROL XL) 25 MG 24 hr tablet Take 1 tablet (25 mg total) by mouth daily. 30 tablet 11   No facility-administered medications prior to visit.    ROS Review of Systems  Constitutional:  Negative for appetite change, diaphoresis, fatigue and unexpected weight change.  HENT: Negative.    Eyes: Negative.   Respiratory:  Negative for cough, chest tightness, shortness of breath and wheezing.   Cardiovascular:  Negative for chest pain, palpitations and leg swelling.  Gastrointestinal:  Negative for abdominal pain, blood in stool, constipation, diarrhea and nausea.  Endocrine: Negative.   Genitourinary:  Positive for scrotal swelling. Negative for difficulty urinating, dysuria, penile swelling and urgency.  Musculoskeletal: Negative.   Skin: Negative.   Neurological:  Negative for dizziness, weakness, light-headedness and headaches.  Hematological:  Negative for adenopathy. Does not bruise/bleed easily.  Psychiatric/Behavioral: Negative.     Objective:  BP 134/78 (BP Location: Left Arm, Patient Position: Sitting, Cuff Size: Large)    Pulse 80    Temp 98.4 F (36.9 C) (Oral)    Resp 16    Ht 5\' 7"  (1.702 m)    Wt 184 lb (83.5 kg)    SpO2 95%    BMI 28.82 kg/m   BP Readings from Last 3 Encounters:  03/28/21 134/78  02/07/21 138/82   01/31/21 (!) 100/57    Wt Readings from Last 3 Encounters:  03/28/21 184 lb (83.5 kg)  01/26/21 180 lb (81.6 kg)  01/18/21 187 lb (84.8 kg)    Physical Exam Vitals reviewed.  HENT:     Nose: Nose normal.     Mouth/Throat:     Mouth: Mucous membranes are moist.  Eyes:     General: No scleral icterus.    Conjunctiva/sclera: Conjunctivae normal.  Cardiovascular:     Rate and Rhythm: Normal rate and regular rhythm.     Heart sounds: No murmur heard. Pulmonary:     Effort: Pulmonary effort is normal.     Breath sounds: No stridor. No wheezing, rhonchi or rales.  Abdominal:     General: Abdomen is flat.     Palpations: There is no mass.     Tenderness: There is no abdominal tenderness. There is no guarding or rebound.     Hernia: A hernia is present. Hernia is present in the left inguinal area. There is no hernia in the right inguinal area.  Genitourinary:    Pubic Area: No rash.      Penis: Normal and circumcised.      Testes:        Right: Mass, tenderness or swelling not present.        Left: Tenderness present. Mass or swelling not present.     Prostate: Normal. Not enlarged, not tender and no nodules present.     Rectum: Normal. Guaiac result negative. No mass, tenderness, anal fissure, external hemorrhoid or internal hemorrhoid. Normal anal tone.     Comments: Inguinal hernia fills the left hemiscrotum Musculoskeletal:        General: Normal range of motion.     Cervical back: Neck supple.     Right lower leg: No edema.     Left lower leg: No edema.  Lymphadenopathy:     Cervical: No cervical adenopathy.     Lower Body: No right inguinal adenopathy. No left inguinal adenopathy.  Skin:    General: Skin is warm and dry.  Neurological:     General: No focal deficit present.     Mental Status: He is alert.  Psychiatric:        Mood and Affect: Mood normal.        Behavior: Behavior normal.    Lab Results  Component Value Date   WBC 5.7 03/28/2021   HGB 14.3  03/28/2021   HCT 43.0 03/28/2021   PLT 165.0 03/28/2021   GLUCOSE 206 (H) 03/28/2021   CHOL 121 10/25/2020   TRIG 204 (H) 10/25/2020   HDL 28 (L) 10/25/2020  LDLDIRECT 93.0 07/26/2020   LDLCALC 59 10/25/2020   ALT 21 09/06/2020   AST 18 09/06/2020   NA 137 03/28/2021   K 4.5 03/28/2021   CL 97 03/28/2021   CREATININE 0.83 03/28/2021   BUN 17 03/28/2021   CO2 31 03/28/2021   TSH 1.31 03/28/2021   PSA 0.13 03/28/2021   INR 1.2 (H) 05/18/2020   HGBA1C 9.9 (H) 03/28/2021   MICROALBUR 1.3 03/28/2021    CT CARDIAC MORPH/PULM VEIN W/CM&W/O CA SCORE  Addendum Date: 12/25/2020   ADDENDUM REPORT: 12/25/2020 17:22 CLINICAL DATA:  Atrial fibrillation scheduled for left atrial appendage closure. EXAM: Cardiac CT/CTA TECHNIQUE: A non-contrast, gated CT scan was obtained with axial slices of 3 mm through the heart for calcium scoring. Calcium scoring was performed using the Agatston method. A 120 kV prospective, gated, contrast cardiac scan was obtained. Gantry rotation speed was 250 msecs and collimation was 0.6 mm. Nitroglycerin was not given. A delayed scan was obtained to exclude left atrial appendage thrombus. The 3D dataset was reconstructed in 5% intervals of the 25-50% of the R-R cycle. Late systolic phases were analyzed on a dedicated workstation using MPR, MIP, and VRT modes. The patient received 80 cc of contrast. FINDINGS: Image quality: Excellent. Noise artifact is: Limited. Left Atrium: The left atrial size is normal. there is no PFO/ASD. There is normal pulmonary vein drainage into the left atrium (2 on the right and 2 on the left). Left Atrial Appendage: Morphology: The left atrial appendage is a small chicken wing morphology. Thrombus: There is no thrombus in the left atrial appendage on contrast or delayed imaging. The following measurements were made regarding left atrial appendage closure: Phase assessed: 35% Landing Zone measurement: 22.4 mm x 14.7 mm. The landing zone is oval in  shape with an average diameter of 17.4 mm. LAA Length (maximum): 13.2 mm Optimal interatrial septum puncture site: Mid and Mid Optimal deployment angle: RAO 20 CRA 24 Catheter: A double curve catheter is recommended. Watchman FLX Device: A 20 mm device is recommended with 13% compression based on average diameter. Oval shaped landing zone noted. Also, small appendage with maximum length will likely not support a 24 mm device. Other comments: None. Coronary Arteries: Calcium scoring note performed due to prior CABG. Normal coronary origin. Right dominance. The study was performed without use of NTG and is insufficient for plaque evaluation in the native arteries. A LIMA to LAD and graft to the Ramus with jump graft to an OM branch are seen. The vein grafts to the PDA/PLV appear occluded. Full assessment not able due to lack of full field of view. Right Atrium: Right atrial size is within normal limits. Right Ventricle: The right ventricular cavity is within normal limits. Left Ventricle: The ventricular cavity size is within normal limits. There are no stigmata of prior infarction. There is no abnormal filling defect. Pulmonary Artery: Normal caliber without proximal filling defect. Cardiac valves: The aortic valve is trileaflet without significant calcification. The mitral valve is normal structure without significant calcification. Aorta: Normal caliber with no significant disease. Pericardium: Normal thickness with no significant effusion or calcium present. Extra-cardiac findings: See attached radiology report for non-cardiac structures. IMPRESSION: 1. The left atrial appendage is a small chicken wing morphology without thrombus. 2. A 20 mm Watchman FLX device is recommended based on the average diameter of the landing zone (17.4 mm, 13% compression) due to is oval shape. The appendage is small and maximum diameter (13.2 mm) will likely preclude a 24  mm device. Would recommend discussion with the structural heart  team. 3. There is no thrombus in the left atrial appendage. 4. A mid and mid IAS puncture site is recommended. 5. Optimal deployment angle: RAO 20 CRA 24 6. Normal coronary origin. Right dominance. 7. S/p CABG. A LIMA-LAD and graft-diagonal with jump to the OM are seen. The grafts to the PDA/PLV appear occluded. Native coronary assessment not performed due to lack of NTG and heavy calcification. [place] B[place] T. Audie Box, MD Electronically Signed   By: Eleonore Chiquito M.D.   On: 12/25/2020 17:22   Result Date: 12/25/2020 EXAM: OVER-READ INTERPRETATION  CT CHEST The following report is an over-read performed by radiologist Dr. Rolm Baptise of [facility], [place] on 12/22/2020. This over-read does not include interpretation of cardiac or coronary anatomy or pathology. The coronary CTA interpretation by the cardiologist is attached. COMPARISON:  None. FINDINGS: Vascular: Heart is normal size.  Aorta normal caliber. Mediastinum/Nodes: No adenopathy Lungs/Pleura: No confluent opacities or effusions. Upper Abdomen: Imaging into the upper abdomen demonstrates no acute findings. Musculoskeletal: Chest wall soft tissues are unremarkable. No acute bony abnormality. IMPRESSION: No acute or significant extracardiac abnormality. Electronically Signed: By: Rolm Baptise M.D. On: 12/22/2020 16:57    Assessment & Plan:   Shomari was seen today for annual exam.  Diagnoses and all orders for this visit:  Primary hypertension- His blood pressure is adequately well controlled. -     Basic metabolic panel; Future -     CBC with Differential/Platelet; Future -     TSH; Future -     TSH -     CBC with Differential/Platelet -     Basic metabolic panel  Type 2 diabetes mellitus with other circulatory complication, without long-term current use of insulin ([place])- His A1c is up to 9.9%.  I recommended that he add basal insulin to his current regimen. -     Microalbumin / creatinine urine ratio; Future -     Basic metabolic panel;  Future -     Hemoglobin A1c; Future -     HM Diabetes Foot Exam -     Hemoglobin A1c -     Basic metabolic panel -     Microalbumin / creatinine urine ratio  Idiopathic chronic gout of multiple sites without tophus- He has achieved his uric acid goal -     Uric acid; Future -     Uric acid  Vitamin B12 deficiency anemia due to intrinsic factor deficiency- H&H, B12, and folate are normal now. -     CBC with Differential/Platelet; Future -     Vitamin B12; Future -     Folate; Future -     Folate -     Vitamin B12 -     CBC with Differential/Platelet  Iron deficiency anemia due to chronic blood loss- H&H and iron levels are normal now. -     CBC with Differential/Platelet; Future -     IBC + Ferritin; Future -     IBC + Ferritin -     CBC with Differential/Platelet  Encounter for general adult medical examination with abnormal findings- Exam completed, labs reviewed, vaccines reviewed, cancer screenings are up-to-date, patient education was given. -     PSA; Future -     PSA  Left inguinal hernia -     Ambulatory referral to General Surgery  Need for vaccination -     Varicella-zoster vaccine IM (Shingrix)  Insulin-requiring or dependent type II diabetes  mellitus ([place]) -     Continuous Blood Gluc Receiver (FREESTYLE LIBRE 2 READER) DEVI; 1 Act by Does not apply route daily. -     Continuous Blood Gluc Sensor (FREESTYLE LIBRE 2 SENSOR) MISC; 1 Act by Does not apply route daily. -     insulin degludec (TRESIBA FLEXTOUCH) 200 UNIT/ML FlexTouch Pen; Inject 50 Units into the skin daily. -     Insulin Pen Needle 32G X 6 MM MISC; 1 Act by Does not apply route daily. -     Amb Referral to Nutrition and Diabetic Education   I am having Johanna B. Loeber Jr. start on [employer] 2 Reader, [employer], [place] FlexTouch, and Insulin Pen Needle. I am also having him maintain his Vitamin D (Ergocalciferol), Magnesium, multivitamin with minerals, metoprolol succinate,  Gvoke HypoPen 2-Pack, isosorbide mononitrate, traMADol, acetaminophen, pantoprazole, ACCRUFeR, apixaban, icosapent Ethyl, ezetimibe, Rybelsus, atorvastatin, allopurinol, metFORMIN, Farxiga, losartan, amLODipine, and Aspirin Low Dose.  Meds ordered this encounter  Medications   Continuous Blood Gluc Receiver (FREESTYLE LIBRE 2 READER) DEVI    Sig: 1 Act by Does not apply route daily.    Dispense:  2 each    Refill:  5   Continuous Blood Gluc Sensor (FREESTYLE LIBRE 2 SENSOR) MISC    Sig: 1 Act by Does not apply route daily.    Dispense:  2 each    Refill:  5   insulin degludec (TRESIBA FLEXTOUCH) 200 UNIT/ML FlexTouch Pen    Sig: Inject 50 Units into the skin daily.    Dispense:  24 mL    Refill:  1   Insulin Pen Needle 32G X 6 MM MISC    Sig: 1 Act by Does not apply route daily.    Dispense:  100 each    Refill:  1     Follow-up: Return in about 6 months (around 09/25/2021).  Scarlette Calico, MD

## 2021-03-30 ENCOUNTER — Encounter: Payer: Self-pay | Admitting: Internal Medicine

## 2021-04-05 ENCOUNTER — Ambulatory Visit: Payer: Managed Care, Other (non HMO) | Admitting: Nutrition

## 2021-04-06 ENCOUNTER — Ambulatory Visit: Payer: Self-pay | Admitting: General Surgery

## 2021-04-06 NOTE — H&P (Signed)
Chief Complaint: Hernia       History of Present Illness: Colton Guzman is a 61 y.o. male who is seen today as an office consultation at the request of Dr. Ronnald Ramp for evaluation of Hernia .   Patient is a 61 year old male, with history of CAD, status post bypass, A-fib-on Eliquis, diabetes, comes in secondary to a large left inguinal hernia.   Patient states the hernias been there for several years at least 2.  He states it is gotten larger.  He does feel that there is gone down towards the scrotum.  He previously had a CT scan in 2022 which I reviewed personally.  This does show a large inguinal scrotal hernia containing small intestines.   Patient denies any previous abdominal operation.  He has had no signs or symptoms of incarceration or strangulation to the hernia.         Review of Systems: A complete review of systems was obtained from the patient.  I have reviewed this information and discussed as appropriate with the patient.  See HPI as well for other ROS.   Review of Systems  Constitutional: Negative for fever.  HENT: Negative for congestion.   Eyes: Negative for blurred vision.  Respiratory: Negative for cough, shortness of breath and wheezing.   Cardiovascular: Negative for chest pain and palpitations.  Gastrointestinal: Negative for heartburn.  Genitourinary: Negative for dysuria.  Musculoskeletal: Negative for myalgias.  Skin: Negative for rash.  Neurological: Negative for dizziness and headaches.  Psychiatric/Behavioral: Negative for depression and suicidal ideas.  All other systems reviewed and are negative.       Medical History: Past Medical History Past Medical History: Diagnosis Date  Anemia    Arthritis    Diabetes mellitus without complication (CMS-HCC)    GERD (gastroesophageal reflux disease)    Hypertension        There is no problem list on file for this patient.     Past Surgical History Past Surgical  History: Procedure Laterality Date  CORONARY ARTERY BYPASS GRAFT          Allergies No Known Allergies    Current Outpatient Medications on File Prior to Visit Medication Sig Dispense Refill  allopurinoL (ZYLOPRIM) 100 MG tablet 1 tablet      atorvastatin (LIPITOR) 40 MG tablet Take 40 mg by mouth once daily      ELIQUIS 5 mg tablet Take 5 mg by mouth 2 (two) times daily      FARXIGA 10 mg tablet Take 10 mg by mouth every morning before breakfast (0630)      FREESTYLE LIBRE 2 READER reader 1 ACT BY DOES NOT APPLY ROUTE DAILY.      isosorbide dinitrate (ISORDIL) 10 MG tablet Take 10 mg by mouth 3 (three) times daily      losartan (COZAAR) 50 MG tablet Take 50 mg by mouth once daily      metFORMIN (GLUCOPHAGE) 1000 MG tablet TAKE 1 TABLET BY MOUTH TWICE A DAY WITH A MEAL FOR 90 DAYS      RYBELSUS 14 mg tablet Take 14 mg by mouth once daily      VASCEPA 1 gram capsule Take 2 g by mouth 2 (two) times daily       No current facility-administered medications on file prior to visit.     Family History Family History Problem Relation Age of Onset  Skin cancer Mother    Obesity Mother    High blood pressure (Hypertension) Mother  Hyperlipidemia (Elevated cholesterol) Mother    High blood pressure (Hypertension) Father    Coronary Artery Disease (Blocked arteries around heart) Father    Diabetes Father    Obesity Sister    High blood pressure (Hypertension) Sister        Social History   Tobacco Use Smoking Status Former  Types: Cigarettes Smokeless Tobacco Never     Social History Social History    Socioeconomic History  Marital status: Unknown Tobacco Use  Smoking status: Former     Types: Cigarettes  Smokeless tobacco: Never Vaping Use  Vaping Use: Never used Substance and Sexual Activity  Alcohol use: Yes  Drug use: Never      Objective:     Vitals:   04/06/21 0929 BP: (!) 144/82 Pulse: 84 Temp: 36.2 C (97.2 F) SpO2: 99% Weight: 83.4 kg (183  lb 12.8 oz) Height: 172.7 cm (5\' 8" )   Body mass index is 27.95 kg/m. Physical Exam Constitutional:      Appearance: Normal appearance.  HENT:     Head: Normocephalic and atraumatic.     Nose: Nose normal. No congestion.     Mouth/Throat:     Mouth: Mucous membranes are moist.     Pharynx: Oropharynx is clear.  Eyes:     Pupils: Pupils are equal, round, and reactive to light.  Cardiovascular:     Rate and Rhythm: Normal rate and regular rhythm.     Pulses: Normal pulses.     Heart sounds: Normal heart sounds. No murmur heard.   No friction rub. No gallop.  Pulmonary:     Effort: Pulmonary effort is normal. No respiratory distress.     Breath sounds: Normal breath sounds. No stridor. No wheezing, rhonchi or rales.  Abdominal:     General: Abdomen is flat.     Hernia: A hernia is present. Hernia is present in the left inguinal area. There is no hernia in the right inguinal area.  Musculoskeletal:        General: Normal range of motion.     Cervical back: Normal range of motion.  Skin:    General: Skin is warm and dry.  Neurological:     General: No focal deficit present.     Mental Status: He is alert and oriented to person, place, and time.  Psychiatric:        Mood and Affect: Mood normal.        Thought Content: Thought content normal.          Assessment and Plan: Diagnoses and all orders for this visit:   Unilateral recurrent inguinal hernia without obstruction or gangrene   Unilateral inguinal hernia without obstruction or gangrene, recurrence not specified     Colton Guzman is a 61 y.o. male  We will obtain cardiac clearance from Dr. Johney Frame prior to scheduling surgery.  Patient will also need to be off his Eliquis for at least 2 days.     1.  We will proceed to the OR for a robotic left inguinal hernia repair with mesh. 2. All risks and benefits were discussed with the patient, to generally include infection, bleeding, damage to surrounding structures,  acute and chronic nerve pain, and recurrence. Alternatives were offered and described.  All questions were answered and the patient voiced understanding of the procedure and wishes to proceed at this point.             No follow-ups on file.   Ralene Ok, MD, FACS [facility]

## 2021-04-07 ENCOUNTER — Telehealth: Payer: Self-pay | Admitting: *Deleted

## 2021-04-07 NOTE — Telephone Encounter (Signed)
° °  Pre-operative Risk Assessment    Patient Name: Colton Guzman [facility].  DOB: [birth year] MRN: [id number]      Request for Surgical Clearance    Procedure:   ROBOTIC LEFT INGUINAL HERNIA REPAIR WITH MESH  Date of Surgery:  Clearance TBD                                 Surgeon:  DR. Ralene Ok Surgeon's Group or Practice Name:  [facility] Phone number:  [phone] Fax number:  [phone] ATTN: Lindwood Coke, RN   Type of Clearance Requested:   - Medical  - Pharmacy:  Hold Apixaban (Eliquis)     Type of Anesthesia:  General    Additional requests/questions:    Jiles Prows   04/07/2021, 12:01 PM

## 2021-04-10 ENCOUNTER — Other Ambulatory Visit: Payer: Self-pay | Admitting: Internal Medicine

## 2021-04-10 ENCOUNTER — Other Ambulatory Visit: Payer: Self-pay

## 2021-04-10 ENCOUNTER — Other Ambulatory Visit: Payer: Self-pay | Admitting: Physician Assistant

## 2021-04-10 DIAGNOSIS — M1A09X0 Idiopathic chronic gout, multiple sites, without tophus (tophi): Secondary | ICD-10-CM

## 2021-04-10 DIAGNOSIS — E785 Hyperlipidemia, unspecified: Secondary | ICD-10-CM

## 2021-04-10 DIAGNOSIS — E1159 Type 2 diabetes mellitus with other circulatory complications: Secondary | ICD-10-CM

## 2021-04-10 DIAGNOSIS — I2510 Atherosclerotic heart disease of native coronary artery without angina pectoris: Secondary | ICD-10-CM

## 2021-04-10 MED ORDER — ISOSORBIDE MONONITRATE ER 30 MG PO TB24: 30.0000 mg | ORAL_TABLET | Freq: Every day | ORAL | 1 refills | Status: DC

## 2021-04-11 NOTE — Telephone Encounter (Signed)
Patient with diagnosis of afib on Eliquis for anticoagulation.    Procedure: ROBOTIC LEFT INGUINAL HERNIA REPAIR WITH MESH Date of procedure: TBD  Patient with hx of severe GI bleed.  CHA2DS2-VASc Score = 4   This indicates a 4.8% annual risk of stroke. The patient's score is based upon: CHF History: 1 HTN History: 1 Diabetes History: 1 Stroke History: 0 Vascular Disease History: 1 Age Score: 0 Gender Score: 0      CrCl 99 ml/min  Per office protocol, patient can hold Eliquis for 2-3 days prior to procedure.

## 2021-04-12 NOTE — Telephone Encounter (Signed)
° °  Name: [facility] Bettey Mare.  DOB: 11-14-1960  MRN: [id number]   Primary Cardiologist: Freada Bergeron, MD  Chart reviewed as part of pre-operative protocol coverage. Patient was contacted 04/12/2021 in reference to pre-operative risk assessment for pending surgery as outlined below.  Cristopher Borbe [employer]. was last seen on 01/26/2021 by Dr. Quentin Ore.  Since that day, [employer] has done well from a cardiac standpoint. He denies any new or concerning symptoms.  According to the Revised Cardiac Risk Index (RCRI), his perioperative risk of major cardiac event is 0.9%. His METs are 8.91 according to the [place] Activity Status Index (DASI).Therefore, based on ACC/AHA guidelines, the patient would be at acceptable risk for the planned procedure without further cardiovascular testing.   The patient was advised that if he develops new symptoms prior to surgery to contact our office to arrange for a follow-up visit, and he verbalized understanding.  Patient with diagnosis of afib on Eliquis for anticoagulation.     Procedure: ROBOTIC LEFT INGUINAL HERNIA REPAIR WITH MESH Date of procedure: TBD   Patient with hx of severe GI bleed.   CHA2DS2-VASc Score = 4   This indicates a 4.8% annual risk of stroke. The patient's score is based upon: CHF History: 1 HTN History: 1 Diabetes History: 1 Stroke History: 0 Vascular Disease History: 1 Age Score: 0 Gender Score: 0       CrCl 99 ml/min   Per office protocol, patient can hold Eliquis for 2-3 days prior to procedure.    I will route this recommendation to the requesting party via Epic fax function and remove from pre-op pool. Please call with questions.  Lenna Sciara, NP 04/12/2021, 8:45 AM

## 2021-05-02 NOTE — Progress Notes (Signed)
Surgical Instructions ? ? ? Your procedure is scheduled on Tuesday, March 21st, 2023. ? ? Report to [facility] Main Entrance "A" at 05:30 A.M., then check in with the Admitting office. ? Call this number if you have problems the morning of surgery: ? [phone] ? ? If you have any questions prior to your surgery date call [phone]: Open Monday-Friday 8am-4pm ? ? ? Remember: ? Do not eat after midnight the night before your surgery ? ?You may drink clear liquids until 04:30 the morning of your surgery.   ?Clear liquids allowed are: Water, Non-Citrus Juices (without pulp), Carbonated Beverages, Clear Tea, Black Coffee ONLY (NO MILK, CREAM OR POWDERED CREAMER of any kind), and Gatorade ? ?Patient Instructions ? ?The day of surgery (if you have diabetes): ?Drink ONE (1) 12 oz G2 given to you in your pre admission testing appointment by 04:30 the morning of surgery. Drink in one sitting. Do not sip.  ?This drink was given to you during your hospital  ?pre-op appointment visit.  ?Nothing else to drink after completing the  ?12 oz bottle of G2. ? ?       If you have questions, please contact your surgeon?s office.  ?  ? Take these medicines the morning of surgery with A SIP OF WATER:  ? ?allopurinol (ZYLOPRIM)  ?atorvastatin (LIPITOR)  ?ezetimibe (ZETIA) ?icosapent Ethyl (VASCEPA) ?isosorbide mononitrate (IMDUR) ?metoprolol succinate (TOPROL XL) ? ?If needed: ? ?acetaminophen (TYLENOL)  ?traMADol Veatrice Bourbon)  ? ?Follow your surgeon's instructions on when to stop Aspirin and Eliquis.  If no instructions were given by your surgeon then you will need to call the office to get those instructions.    ? ?As of today, STOP taking any Aspirin (unless otherwise instructed by your surgeon) Aleve, Naproxen, Ibuprofen, Motrin, Advil, Goody's, BC's, all herbal medications, fish oil, and all vitamins. ? ? ?WHAT DO I DO ABOUT MY DIABETES MEDICATION? ? ? ?Do not take FARXIGA the day before surgery and the day of surgery.  ? ?Do not  take metFORMIN (GLUCOPHAGE) and RYBELSUS the day of surgery. ? ? ?HOW TO MANAGE YOUR DIABETES ?BEFORE AND AFTER SURGERY ? ?Why is it important to control my blood sugar before and after surgery? ?Improving blood sugar levels before and after surgery helps healing and can limit problems. ?A way of improving blood sugar control is eating a healthy diet by: ? Eating less sugar and carbohydrates ? Increasing activity/exercise ? Talking with your doctor about reaching your blood sugar goals ?High blood sugars (greater than 180 mg/dL) can raise your risk of infections and slow your recovery, so you will need to focus on controlling your diabetes during the weeks before surgery. ?Make sure that the doctor who takes care of your diabetes knows about your planned surgery including the date and location. ? ?How do I manage my blood sugar before surgery? ?Check your blood sugar at least 4 times a day, starting 2 days before surgery, to make sure that the level is not too high or low. ? ?Check your blood sugar the morning of your surgery when you wake up and every 2 hours until you get to the Short Stay unit. ? ?If your blood sugar is less than 70 mg/dL, you will need to treat for low blood sugar: ?Do not take insulin. ?Treat a low blood sugar (less than 70 mg/dL) with ? cup of clear juice (cranberry or apple), 4 glucose tablets, OR glucose gel. ?Recheck blood sugar in 15 minutes after treatment (to  make sure it is greater than 70 mg/dL). If your blood sugar is not greater than 70 mg/dL on recheck, call [phone] for further instructions. ?Report your blood sugar to the short stay nurse when you get to Short Stay. ? ?If you are admitted to the hospital after surgery: ?Your blood sugar will be checked by the staff and you will probably be given insulin after surgery (instead of oral diabetes medicines) to make sure you have good blood sugar levels. ?The goal for blood sugar control after surgery is 80-180 mg/dL.  ? ? ? The  day of surgery: ?         ?Do not wear jewelry  ?Do not wear lotions, powders, colognes, or deodorant. ?Men may shave face and neck. ?Do not bring valuables to the hospital. ? ? ?[facility] is not responsible for any belongings or valuables. .  ? ?Do NOT Smoke (Tobacco/Vaping)  24 hours prior to your procedure ? ?If you use a CPAP at night, you may bring your mask for your overnight stay. ?  ?Contacts, glasses, hearing aids, dentures or partials may not be worn into surgery, please bring cases for these belongings ?  ?For patients admitted to the hospital, discharge time will be determined by your treatment team. ?  ?Patients discharged the day of surgery will not be allowed to drive home, and someone needs to stay with them for 24 hours. ? ?NO VISITORS WILL BE ALLOWED IN PRE-OP WHERE PATIENTS ARE PREPPED FOR SURGERY.  ONLY 1 SUPPORT PERSON MAY BE PRESENT IN THE WAITING ROOM WHILE YOU ARE IN SURGERY.  IF YOU ARE TO BE ADMITTED, ONCE YOU ARE IN YOUR ROOM YOU WILL BE ALLOWED TWO (2) VISITORS. 1 (ONE) VISITOR MAY STAY OVERNIGHT BUT MUST ARRIVE TO THE ROOM BY 8pm.  Minor children may have two parents present. Special consideration for safety and communication needs will be reviewed on a case by case basis. ? ?Special instructions:   ? ?Oral Hygiene is also important to reduce your risk of infection.  Remember - BRUSH YOUR TEETH THE MORNING OF SURGERY WITH YOUR REGULAR TOOTHPASTE ? ? ?[place]- Preparing For Surgery ? ?Before surgery, you can play an important role. Because skin is not sterile, your skin needs to be as free of germs as possible. You can reduce the number of germs on your skin by washing with CHG (chlorahexidine gluconate) Soap before surgery.  CHG is an antiseptic cleaner which kills germs and bonds with the skin to continue killing germs even after washing.   ? ? ?Please do not use if you have an allergy to CHG or antibacterial soaps. If your skin becomes reddened/irritated stop using the CHG.  ?Do  not shave (including legs and underarms) for at least 48 hours prior to first CHG shower. It is OK to shave your face. ? ?Please follow these instructions carefully. ?  ? ? Shower the NIGHT BEFORE SURGERY and the MORNING OF SURGERY with CHG Soap.  ? If you chose to wash your hair, wash your hair first as usual with your normal shampoo. After you shampoo, rinse your hair and body thoroughly to remove the shampoo.  Then [employer] and genitals (private parts) with your normal soap and rinse thoroughly to remove soap. ? ?After that Use CHG Soap as you would any other liquid soap. You can apply CHG directly to the skin and wash gently with a scrungie or a clean washcloth.  ? ?Apply the CHG Soap to your  body ONLY FROM THE NECK DOWN.  Do not use on open wounds or open sores. Avoid contact with your eyes, ears, mouth and genitals (private parts). Wash Face and genitals (private parts)  with your normal soap.  ? ?Wash thoroughly, paying special attention to the area where your surgery will be performed. ? ?Thoroughly rinse your body with warm water from the neck down. ? ?DO NOT shower/wash with your normal soap after using and rinsing off the CHG Soap. ? ?Pat yourself dry with a CLEAN TOWEL. ? ?Wear CLEAN PAJAMAS to bed the night before surgery ? ?Place CLEAN SHEETS on your bed the night before your surgery ? ?DO NOT SLEEP WITH PETS. ? ? ?Day of Surgery: ? ?Take a shower with CHG soap. ?Wear Clean/Comfortable clothing the morning of surgery ?Do not apply any deodorants/lotions.   ?Remember to brush your teeth WITH YOUR REGULAR TOOTHPASTE. ? ?  ?Please read over the following fact sheets that you were given.   ?

## 2021-05-03 ENCOUNTER — Other Ambulatory Visit: Payer: Self-pay

## 2021-05-03 ENCOUNTER — Encounter (HOSPITAL_COMMUNITY): Payer: Self-pay

## 2021-05-03 ENCOUNTER — Encounter (HOSPITAL_COMMUNITY)
Admission: RE | Admit: 2021-05-03 | Discharge: 2021-05-03 | Disposition: A | Discharge status: Home / Self Care | Payer: Managed Care, Other (non HMO) | Source: Ambulatory Visit | Attending: General Surgery | Admitting: General Surgery

## 2021-05-03 VITALS — BP 140/91 | HR 79 | Temp 98.4°F | Resp 17 | Ht 68.0 in | Wt 181.4 lb

## 2021-05-03 DIAGNOSIS — E119 Type 2 diabetes mellitus without complications: Secondary | ICD-10-CM | POA: Diagnosis not present

## 2021-05-03 DIAGNOSIS — I2510 Atherosclerotic heart disease of native coronary artery without angina pectoris: Secondary | ICD-10-CM | POA: Insufficient documentation

## 2021-05-03 DIAGNOSIS — D500 Iron deficiency anemia secondary to blood loss (chronic): Secondary | ICD-10-CM | POA: Insufficient documentation

## 2021-05-03 DIAGNOSIS — Z01812 Encounter for preprocedural laboratory examination: Secondary | ICD-10-CM | POA: Diagnosis present

## 2021-05-03 DIAGNOSIS — Z01818 Encounter for other preprocedural examination: Secondary | ICD-10-CM

## 2021-05-03 DIAGNOSIS — I480 Paroxysmal atrial fibrillation: Secondary | ICD-10-CM | POA: Insufficient documentation

## 2021-05-03 DIAGNOSIS — Z8674 Personal history of sudden cardiac arrest: Secondary | ICD-10-CM | POA: Insufficient documentation

## 2021-05-03 DIAGNOSIS — Z951 Presence of aortocoronary bypass graft: Secondary | ICD-10-CM | POA: Insufficient documentation

## 2021-05-03 DIAGNOSIS — K4090 Unilateral inguinal hernia, without obstruction or gangrene, not specified as recurrent: Secondary | ICD-10-CM | POA: Insufficient documentation

## 2021-05-03 DIAGNOSIS — I25810 Atherosclerosis of coronary artery bypass graft(s) without angina pectoris: Secondary | ICD-10-CM | POA: Insufficient documentation

## 2021-05-03 DIAGNOSIS — I10 Essential (primary) hypertension: Secondary | ICD-10-CM | POA: Diagnosis not present

## 2021-05-03 HISTORY — DX: Hyperuricemia without signs of inflammatory arthritis and tophaceous disease: E79.0

## 2021-05-03 HISTORY — DX: Presence of dental prosthetic device (complete) (partial): Z97.2

## 2021-05-03 HISTORY — DX: Unspecified osteoarthritis, unspecified site: M19.90

## 2021-05-03 HISTORY — DX: Anemia, unspecified: D64.9

## 2021-05-03 LAB — CBC
HCT: 44 % (ref 39.0–52.0)
Hemoglobin: 14.2 g/dL (ref 13.0–17.0)
MCH: 29.3 pg (ref 26.0–34.0)
MCHC: 32.3 g/dL (ref 30.0–36.0)
MCV: 90.9 fL (ref 80.0–100.0)
Platelets: 191 10*3/uL (ref 150–400)
RBC: 4.84 MIL/uL (ref 4.22–5.81)
RDW: 14.1 % (ref 11.5–15.5)
WBC: 6.5 10*3/uL (ref 4.0–10.5)
nRBC: 0 % (ref 0.0–0.2)

## 2021-05-03 LAB — BASIC METABOLIC PANEL
Anion gap: 10 (ref 5–15)
BUN: 13 mg/dL (ref 6–20)
CO2: 27 mmol/L (ref 22–32)
Calcium: 9.9 mg/dL (ref 8.9–10.3)
Chloride: 103 mmol/L (ref 98–111)
Creatinine, Ser: 0.75 mg/dL (ref 0.61–1.24)
GFR, Estimated: >60 mL/min (ref >60)
Glucose, Bld: 126 mg/dL — ABNORMAL HIGH (ref 70–99)
Potassium: 4.4 mmol/L (ref 3.5–5.1)
Sodium: 140 mmol/L (ref 135–145)

## 2021-05-03 LAB — GLUCOSE, CAPILLARY: Glucose-Capillary: 136 mg/dL — ABNORMAL HIGH (ref 70–99)

## 2021-05-03 NOTE — Progress Notes (Addendum)
Surgical Instructions ? ? ? Your procedure is scheduled on Tuesday, March 21st, 2023. ? ? Report to [facility] Main Entrance "A" at 05:30 A.M., then check in with the Admitting office. ? Call this number if you have problems the morning of surgery: ? [phone] ? ? If you have any questions prior to your surgery date call [phone]: Open Monday-Friday 8am-4pm ? ? ? Remember: ? Do not eat or drink after midnight the night before your surgery-Monday ? ?            Take these medicines the morning of surgery with A SIP OF WATER:  ? ?allopurinol (ZYLOPRIM)  ?atorvastatin (LIPITOR)  ?ezetimibe (ZETIA) ?icosapent Ethyl (VASCEPA) ?isosorbide mononitrate (IMDUR) ?metoprolol succinate (TOPROL XL) ?         ?If needed: ?acetaminophen (TYLENOL)  ?traMADol (ULTRAM ? ?  ?Do not take FARXIGA the day before surgery and the day of surgery.  ?  ?Do not take metFORMIN (GLUCOPHAGE) and RYBELSUS the day of surgery. ? ?Do not wear jewelry  ?Do not wear lotions, powders, colognes, or deodorant. ?Men may shave face and neck. ?Do not bring valuables to the hospital. ? ? ?[facility] is not responsible for any belongings or valuables. .  ? ?Do NOT Smoke (Tobacco/Vaping)  24 hours prior to your procedure ?  ?Contacts, glasses, hearing aids, dentures or partials may not be worn into surgery, please bring cases for these belongings ?   ?Patients discharged the day of surgery will not be allowed to drive home, and someone needs to stay with them for 24 hours. ? ?NO VISITORS WILL BE ALLOWED IN PRE-OP WHERE PATIENTS ARE PREPPED FOR SURGERY.  ONLY 1 SUPPORT PERSON MAY BE PRESENT IN THE WAITING ROOM WHILE YOU ARE IN SURGERY.  IF YOU ARE TO BE ADMITTED, ONCE YOU ARE IN YOUR ROOM YOU WILL BE ALLOWED TWO (2) VISITORS. 1 (ONE) VISITOR MAY STAY OVERNIGHT BUT MUST ARRIVE TO THE ROOM BY 8pm.   ? ?Special instructions:   ? ?Oral Hygiene is also important to reduce your risk of infection.  Remember - BRUSH YOUR TEETH THE MORNING OF SURGERY WITH YOUR  REGULAR TOOTHPASTE ? ? ?[facility]- Preparing For [facility] ? ?Before surgery, you can play an important role. Because skin is not sterile, your skin needs to be as free of germs as possible. You can reduce the number of germs on your skin by washing with CHG (chlorahexidine gluconate) Soap before surgery.  CHG is an antiseptic cleaner which kills germs and bonds with the skin to continue killing germs even after washing.   ? ? ?Please do not use if you have an allergy to CHG or antibacterial soaps. If your skin becomes reddened/irritated stop using the CHG.  ?Do not shave (including legs and underarms) for at least 48 hours prior to first CHG shower. It is OK to shave your face. ? ?Please follow these instructions carefully. ?  ? ? Shower the [employer] SURGERY-Mon and the MORNING OF SURGERY-Tues with CHG Soap.  ? If you chose to wash your hair, wash your hair first as usual with your normal shampoo. After you shampoo, rinse your hair and body thoroughly to remove the shampoo.  Then [employer] and genitals (private parts) with your normal soap and rinse thoroughly to remove soap. ? ?After that Use CHG Soap as you would any other liquid soap. You can apply CHG directly to the skin and wash gently with a scrungie or a clean washcloth.  ? ?  Apply the CHG Soap to your body ONLY FROM THE NECK DOWN.  Do not use on open wounds or open sores. Avoid contact with your eyes, ears, mouth and genitals (private parts). Wash Face and genitals (private parts)  with your normal soap.  ? ?Wash thoroughly, paying special attention to the area where your surgery will be performed. ? ?Thoroughly rinse your body with warm water from the neck down. ? ?DO NOT shower/wash with your normal soap after using and rinsing off the CHG Soap. ? ?Pat yourself dry with a CLEAN TOWEL. ? ?Wear CLEAN PAJAMAS to bed the night before surgery ? ?Place CLEAN SHEETS on your bed the night before your surgery ? ?DO NOT SLEEP WITH PETS. ? ? ?Day of  Surgery: ? ?Take a shower with CHG soap. ?Wear Clean/Comfortable clothing the morning of surgery ?Do not apply any deodorants/lotions.   ?Remember to brush your teeth WITH YOUR REGULAR TOOTHPASTE. ? ?  ?Please read over the following fact sheets that you were given.   ?

## 2021-05-03 NOTE — Progress Notes (Signed)
DUE TO COVID-19 ONLY ONE VISITOR IS ALLOWED TO COME WITH YOU AND STAY IN THE WAITING ROOM ONLY DURING PRE OP AND PROCEDURE DAY OF SURGERY.  ? ?PCP - Dr Scarlette Calico ?Cardiologist - Dr Lars Mage ?Oncology - Lottie Dawson, NP ? ?Chest x-ray - 08/12/20 (1V) ?EKG - 12/02/20 ?Stress Test - n/a ?ECHO - 10/25/20 ?Cardiac Cath - 03/04/20 ? ?ICD Pacemaker/Loop - none ? ?Sleep Study -  n/a ?CPAP - none ? ?Do not take farxiga on Monday or Tuesday (the morning of surgery).  Do not take Metformin and Rybelsus on DOS.  ? ?If your blood sugar is less than 70 mg/dL, you will need to treat for low blood sugar: ?Treat a low blood sugar (less than 70 mg/dL) with ? cup of clear juice (cranberry or apple), 4 glucose tablets, OR glucose gel. ?Recheck blood sugar in 15 minutes after treatment (to make sure it is greater than 70 mg/dL). If your blood sugar is not greater than 70 mg/dL on recheck, call [phone] for further instructions. ? ?Blood Thinner Instructions:  Follow your surgeon's instructions on when to stop Eliquis prior to surgery.  Last dose will be on 05/05/21 per patient.   ? ?Aspirin Instructions: Follow your surgeon's instructions on when to stop aspirin prior to surgery,  If no instructions were given by your surgeon then you will need to call the office for those instructions.  Last dose will be on 05/05/21 per patient. ? ?Anesthesia review: Yes ? ?STOP now taking any Aspirin (unless otherwise instructed by your surgeon), Aleve, Naproxen, Ibuprofen, Motrin, Advil, Goody's, BC's, all herbal medications, fish oil, and all vitamins.  ? ?Coronavirus Screening ?Covid test n/a Ambulatory Surgery  ?Do you have any of the following symptoms:  ?Cough yes/no: No ?Fever (>100.35F)  yes/no: No ?Runny nose yes/no: No ?Sore throat yes/no: No ?Difficulty breathing/shortness of breath  yes/no: No ? ?Have you traveled in the last 14 days and where? yes/no: No ? ?Patient verbalized understanding of instructions that were given to them at  the PAT appointment. Patient was also instructed that they will need to review over the PAT instructions again at home before surgery. ? ?

## 2021-05-04 LAB — HEMOGLOBIN A1C
Hgb A1c MFr Bld: 7.7 % — ABNORMAL HIGH (ref 4.8–5.6)
Mean Plasma Glucose: 174 mg/dL

## 2021-05-04 NOTE — Anesthesia Preprocedure Evaluation (Addendum)
Anesthesia Evaluation  ?Patient identified by MRN, date of birth, ID band ?Patient awake ? ? ? ?Reviewed: ?Allergy & Precautions, H&P , NPO status , Patient's Chart, lab work & pertinent test results ? ?Airway ?Mallampati: II ? ?TM Distance: >3 FB ?Neck ROM: Full ? ? ? Dental ? ?(+) Edentulous Upper, Partial Lower,  ?  ?Pulmonary ?neg pulmonary ROS, former smoker,  ?  ?Pulmonary exam normal ?breath sounds clear to auscultation ? ? ? ? ? ? Cardiovascular ?Exercise Tolerance: Good ?hypertension, Pt. on medications and Pt. 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 ? ?  ?Neuro/Psych ?negative neurological ROS ? negative psych ROS  ? GI/Hepatic ?negative GI ROS, Neg liver ROS,   ?Endo/Other  ?negative endocrine ROSdiabetes, Type 2, Oral Hypoglycemic Agents, Insulin Dependent ? Renal/GU ?negative Renal ROS  ?negative genitourinary ?  ?Musculoskeletal ? ?(+) Arthritis , Osteoarthritis,   ? Abdominal ?  ?Peds ? Hematology ?negative hematology ROS ?(+) Blood dyscrasia, anemia ,   ?Anesthesia Other Findings ? ? Reproductive/Obstetrics ?negative OB ROS ? ?  ? ? ? ? ? ? ? ? ? ? ? ? ? ?  ?  ? ? ? ? ? ?Anesthesia Physical ?Anesthesia Plan ? ?ASA: 3 ? ?Anesthesia Plan: General  ? ?Post-op Pain Management: Tylenol PO (pre-op)*  ? ?Induction: Intravenous ? ?PONV Risk Score and Plan: 3 and Ondansetron, Dexamethasone and Midazolam ? ?Airway Management Planned: Oral ETT ? ?Additional Equipment: None ? ?Intra-op Plan:  ? ?Post-operative Plan: Extubation in OR ? ?Informed Consent: I have reviewed the patients History and Physical, chart, labs and discussed the procedure including the risks, benefits and alternatives for the proposed anesthesia with the patient or authorized representative who has indicated his/her understanding and acceptance.  ? ? ? ? ? ?Plan Discussed with: CRNA, Anesthesiologist and  Surgeon ? ?Anesthesia Plan Comments: (See APP note by Durel Salts, FNP )  ? ? ? ? ?Anesthesia Quick Evaluation ? ?

## 2021-05-04 NOTE — Progress Notes (Signed)
Anesthesia Chart Review: ? ? Case: [id number] Date/Time: 05/09/21 0715  ? Procedure: XI ROBOTIC ASSISTED LEFT INGUINAL HERNIA REPAIR WITH MESH (Left)  ? Anesthesia type: General  ? Pre-op diagnosis: LEFT INGUINAL HERNIA  ? Location: [facility] ROOM 10 / [facility]  ? Surgeons: Ralene Ok, MD  ? ?  ? ? ?DISCUSSION: ?Pt is 61 years old with hx CAD (s/p vtach arrest and CABG x5 02/2020), atrial fibrillation, HTN, DM, .iron deficiency anemia due to GI blood loss ? ?Hospitalized 6/24-30/22 for GI bleed requiring transfusion of 6 units of blood.  ? ?Hospitalized 1/3-23/22 for hematochezia. Complicated by chest pain in the setting of a blood bowel movement that initially responded to SL NTG but then recurred with subsequent VT arrest requiring 2-60min CPR and shock. Post-shock rhythm Afib.  ?Cath showed severe 3 vessel disease. Underwent successful CABG x5 on 03/08/20 with LIMA to distal LAD, SVG to PDA., SVG to posterior lateral branch of the right coronary artery, left radial artery to the ramus intermediate and first obtuse marginal coronary arteries ? ?Last dose eliquis 05/05/21 ? ? ?VS: BP (!) 140/91   Pulse 79   Temp 36.9 ?C (Oral)   Resp 17   Ht 5\' 8"  (1.727 m)   Wt 82.3 kg   SpO2 100%   BMI 27.58 kg/m?  ? ?PROVIDERS: ?- PCP is [name], MD ?- Cardiologist is [name], MD. Cleared for surgery at acceptable risk by Diona Browner, NP on 04/12/21 ?- EP cardiologist is Lars Mage, MD. Last virtual visit 01/26/21. PRN f/u recommended as pt is not candidate for Watchman device ? ?LABS: Labs reviewed: Acceptable for surgery. ?(all labs ordered are listed, but only abnormal results are displayed) ? ?Labs Reviewed  ?GLUCOSE, CAPILLARY - Abnormal; Notable for the following components:  ?    Result Value  ? Glucose-Capillary 136 (*)   ? All other components within normal limits  ?BASIC METABOLIC PANEL - Abnormal; Notable for the following components:  ? Glucose, Bld 126 (*)   ? All other components within normal  limits  ?HEMOGLOBIN A1C - Abnormal; Notable for the following components:  ? Hgb A1c MFr Bld 7.7 (*)   ? All other components within normal limits  ?CBC  ? ? ? ?IMAGES: ?1 view CXR 08/12/20:  ?- No acute cardiopulmonary findings. ? ? ?EKG 12/02/20: sinus rhythm ? ? ?CV: ?CT cardiac morphology/pulmonary vein 12/22/20:  ?1. The left atrial appendage is a small chicken wing morphology ?without thrombus. ?2. A 20 mm Watchman FLX device is recommended based on the average diameter of the landing zone (17.4 mm, 13% compression) due to is oval shape. The appendage is small and maximum diameter (13.2 mm) will likely preclude a 24 mm device. Would recommend discussion with the structural heart team. ?3. There is no thrombus in the left atrial appendage. ?4. A mid and mid IAS puncture site is recommended. ?5. Optimal deployment angle: RAO 20 CRA 24 ?6. Normal coronary origin. Right dominance. ?7. S/p CABG. A LIMA-LAD and graft-diagonal with jump to the OM are seen. The grafts to the PDA/PLV appear occluded. Native coronary assessment not performed due to lack of NTG and heavy calcification. ? ? ?Echo 10/25/20:  ?1. Left ventricular ejection fraction, by estimation, is 60 to 65%. Left ventricular ejection fraction by 3D volume is 60 %. The left ventricle has normal function. The left ventricle demonstrates regional wall motion abnormalities (see scoring  ?diagram/findings for description). Left ventricular diastolic parameters are consistent with  Grade I diastolic dysfunction (impaired relaxation). There is mild hypokinesis of the left ventricular, mid-apical anteroseptal wall and anterior wall.  ?2. Abnormal RV free wall strain at -17%. Right ventricular systolic function is normal. The right ventricular size is normal. There is normal pulmonary artery systolic pressure. The estimated right ventricular systolic pressure is 16.1 mmHg.  ?3. Normal LA strain.  ?4. The mitral valve is grossly normal. Trivial mitral valve   ?regurgitation.  ?5. The aortic valve is tricuspid. Aortic valve regurgitation is trivial.  ?Mild aortic valve sclerosis is present, with no evidence of aortic valve stenosis.  ?6. The inferior vena cava is normal in size with <50% respiratory  ?variability, suggesting right atrial pressure of 8 mmHg.  ?- Comparison(s): Changes from prior study are noted. 02/25/2020: LVEF 50-55%, LAD territory hypokinesis, RVSP 49 mmHG.  ? ? ?Cardiac monitor 03/28/20:  ?Patient monitoring period was from 03/28/20-04/26/20 ?Overall burden of Afib was <1% (1 hour and 31 minutes in total). HR ranged in Afib from 93-157bpm ?Predominant rhythm was NSR with average HR 93bpm (range from 65bpm to 171bpm) ?Occasional ventricular ectopy (both PVCs and NSVT) with 3% burden; no sustained VT ?Rare SVE <1% ?No signficant pauses or sustained ventricular arrhythmias ? ? ?Cardiac cath 03/04/20 (pre-CABG):  ?Prox RCA lesion is 70% stenosed. ?Dist RCA lesion is 50% stenosed. ?RPDA lesion is 70% stenosed. ?3rd RPL lesion is 99% stenosed. ?2nd RPL lesion is 99% stenosed. ?Mid LAD lesion is 90% stenosed. ?Ost LAD to Prox LAD lesion is 70% stenosed. ?Ost Cx to Prox Cx lesion is 100% stenosed. ?Mid LM to [facility] LAD lesion is 60% stenosed. ?1. Severe proximal LAD stenosis. Severe mid LAD stenosis.  ?2. Complete occlusion of the proximal Circumflex with filling by right to left and left to left collaterals.  ?3. The RCA is a large dominant vessel with severe proximal stenosis, moderate distal stenosis. Severe stenosis in the posterolateral artery and the PDA.  ?4. LVEDP 14 ? ?  ?Past Medical History:  ?Diagnosis Date  ? Anemia   ? hx iron transfusion  ? Arthritis   ? left shoulder  ? CAD (coronary artery disease) of artery bypass graft 02/2020  ? Diabetes mellitus without complication ([place])   ? type 2  ? Elevated blood uric acid level   ? GI bleed   ? History of blood transfusion 08/19/2020  ? Hyperlipidemia LDL goal <70   ? Hypertension   ? PAF (paroxysmal atrial  fibrillation) ([place])   ? Wears partial dentures   ? upper dent/lower partial  ? ? ?Past Surgical History:  ?Procedure Laterality Date  ? BIOPSY  08/17/2020  ? Procedure: BIOPSY;  Surgeon: Mauri Pole, MD;  Location: [place];  Service: Endoscopy;;  ? COLONOSCOPY WITH PROPOFOL Left 02/27/2020  ? Procedure: COLONOSCOPY WITH PROPOFOL;  Surgeon: Otis Brace, MD;  Location: [facility] ENDOSCOPY;  Service: Gastroenterology;  Laterality: Left;  ? COLONOSCOPY WITH PROPOFOL N/A 08/13/2020  ? Procedure: COLONOSCOPY WITH PROPOFOL;  Surgeon: Gatha Mayer, MD;  Location[facility] ENDOSCOPY;  Service: Endoscopy;  Laterality: N/A;  ? CORONARY ARTERY BYPASS GRAFT N/A 03/08/2020  ? Procedure: CORONARY ARTERY BYPASS GRAFTING (CABG) x 5 ON PUMP;  Surgeon: Wonda Olds, MD;  [facility];  Service: Open Heart Surgery;  Laterality: N/A;  ? ENDOVEIN HARVEST OF GREATER SAPHENOUS VEIN Right 03/08/2020  ? Procedure: ENDOVEIN HARVEST OF GREATER SAPHENOUS VEIN;  Surgeon: Wonda Olds, MD;  Location: [place];  Service: Open Heart Surgery;  Laterality: Right;  ?  ESOPHAGOGASTRODUODENOSCOPY (EGD) WITH PROPOFOL N/A 02/27/2020  ? Procedure: ESOPHAGOGASTRODUODENOSCOPY (EGD) WITH PROPOFOL;  Surgeon: Otis Brace, MD;  Location: [facility]OS[facility];  Service: Gastroenterology;  Laterality: N/A;  ? ESOPHAGOGASTRODUODENOSCOPY (EGD) WITH PROPOFOL N/A 08/17/2020  ? Procedure: ESOPHAGOGASTRODUODENOSCOPY (EGD) WITH PROPOFOL;  Surgeon: Mauri Pole, MD;  Location: [facility] ENDOSCOPY;  Service: Endoscopy;  Laterality: N/A;  ? LEFT HEART CATH AND CORONARY ANGIOGRAPHY N/A 03/04/2020  ? Procedure: LEFT HEART CATH AND CORONARY ANGIOGRAPHY;  Surgeon: Burnell Blanks, MD;  Location: [place] CV LAB;  Service: Cardiovascular;  Laterality: N/A;  ? RADIAL ARTERY HARVEST Left 03/08/2020  ? Procedure: RADIAL ARTERY HARVEST;  Surgeon: Wonda Olds, MD;  Location: [place];  Service: Open Heart Surgery;  Laterality: Left;  ? TEE WITHOUT CARDIOVERSION N/A  03/08/2020  ? Procedure: TRANSESOPHAGEAL ECHOCARDIOGRAM (TEE);  Surgeon: Wonda Olds, MD;  Location: [place];  Service: Open Heart Surgery;  Laterality: N/A;  ? ? ?MEDICATIONS: ? acetaminophen (TYLENOL) 500 MG tablet

## 2021-05-08 NOTE — H&P (Signed)
?  ?Chief Complaint: Hernia ?  ?  ?  ?History of Present Illness: ?Colton Guzman is a 61 y.o. male who is seen today as an office consultation at the request of Dr. Ronnald Ramp for evaluation of Hernia ?Colton Guzman   ?Patient is a 61 year old male, with history of CAD, status post bypass, A-fib-on Eliquis, diabetes, comes in secondary to a large left inguinal hernia. ?  ?Patient states the hernias been there for several years at least 2.  He states it is gotten larger.  He does feel that there is gone down towards the scrotum.  He previously had a CT scan in 2022 which I reviewed personally.  This does show a large inguinal scrotal hernia containing small intestines. ?  ?Patient denies any previous abdominal operation.  He has had no signs or symptoms of incarceration or strangulation to the hernia. ?  ?  ?  ?  ?Review of Systems: ?A complete review of systems was obtained from the patient.  I have reviewed this information and discussed as appropriate with the patient.  See HPI as well for other ROS. ?  ?Review of Systems  ?Constitutional: Negative for fever.  ?HENT: Negative for congestion.   ?Eyes: Negative for blurred vision.  ?Respiratory: Negative for cough, shortness of breath and wheezing.   ?Cardiovascular: Negative for chest pain and palpitations.  ?Gastrointestinal: Negative for heartburn.  ?Genitourinary: Negative for dysuria.  ?Musculoskeletal: Negative for myalgias.  ?Skin: Negative for rash.  ?Neurological: Negative for dizziness and headaches.  ?Psychiatric/Behavioral: Negative for depression and suicidal ideas.  ?All other systems reviewed and are negative. ?  ?  ?  ?Medical History: ?Past Medical History ?Past Medical History: ?Diagnosis        Date ?           Anemia              ?           Arthritis              ?           Diabetes mellitus without complication (CMS-HCC)    ?           GERD (gastroesophageal reflux disease)        ?           Hypertension     ?  ?  ?  ?There is no problem list on file for  this patient. ?  ?  ?Past Surgical History ?Past Surgical History: ?Procedure       Laterality         Date ?           CORONARY ARTERY BYPASS GRAFT                     ?  ?  ?  ?Allergies ?No Known Allergies ?  ?  ?Current Outpatient Medications on File Prior to Visit ?Medication       Sig       Dispense         Refill ?           allopurinoL (ZYLOPRIM) 100 MG tablet        1 tablet                           ?           atorvastatin (LIPITOR) 40 MG tablet  Take 40 mg  by mouth once daily                      ?           ELIQUIS 5 mg tablet   Take 5 mg by mouth 2 (two) times daily                       ?           FARXIGA 10 mg tablet           Take 10 mg by mouth every morning before breakfast (0630)                        ?           FREESTYLE LIBRE 2 READER reader        1 ACT BY DOES NOT APPLY ROUTE DAILY.                         ?           isosorbide dinitrate (ISORDIL) 10 MG tablet  Take 10 mg by mouth 3 (three) times daily                     ?           losartan (COZAAR) 50 MG tablet       Take 50 mg by mouth once daily                      ?           metFORMIN (GLUCOPHAGE) 1000 MG tablet        TAKE 1 TABLET BY MOUTH TWICE A DAY WITH A MEAL FOR 90 DAYS                         ?           RYBELSUS 14 mg tablet        Take 14 mg by mouth once daily                      ?           VASCEPA 1 gram capsule     Take 2 g by mouth 2 (two) times daily                          ?  ?No current facility-administered medications on file prior to visit. ?  ?  ?Family History ?Family History ?Problem           Relation           Age of Onset ?           Skin cancer     Mother   ?           Obesity            Mother   ?           High blood pressure (Hypertension)   Mother   ?           Hyperlipidemia (Elevated cholesterol)            Mother   ?           High blood pressure (Hypertension)  Father    ?           Coronary Artery Disease (Blocked arteries around heart)     Father    ?           Diabetes          Father    ?            Obesity            Sister     ?           High blood pressure (Hypertension)   Sister     ?  ?  ?  ?Social History ?  ?Tobacco Use ?Smoking Status           Former ?           Types: Cigarettes ?Smokeless Tobacco   Never ?  ?  ?Social History ?Social History ?  ?  ?Socioeconomic History ?           Marital status:  Unknown ?Tobacco Use ?           Smoking status:          Former ?                          Types: Cigarettes ?           Smokeless tobacco:    Never ?Vaping Use ?           Vaping Use:    Never used ?Substance and Sexual Activity ?           Alcohol use:    Yes ?           Drug use:        Never ?  ?  ?  ?Objective: ?  ?  ?Vitals: ?             04/06/21 0929 ?BP:      (!) 144/82 ?Pulse:  84 ?Temp:  36.2 ?C (97.2 ?F) ?SpO2:  99% ?Weight:            83.4 kg (183 lb 12.8 oz) ?Height: 172.7 cm (5\' 8" ) ?  ?Body mass index is 27.95 kg/m?Colton Guzman ?Physical Exam ?Constitutional:   ?   Appearance: Normal appearance.  ?HENT:  ?   Head: Normocephalic and atraumatic.  ?   Nose: Nose normal. No congestion.  ?   Mouth/Throat:  ?   Mouth: Mucous membranes are moist.  ?   Pharynx: Oropharynx is clear.  ?Eyes:  ?   Pupils: Pupils are equal, round, and reactive to light.  ?Cardiovascular:  ?   Rate and Rhythm: Normal rate and regular rhythm.  ?   Pulses: Normal pulses.  ?   Heart sounds: Normal heart sounds. No murmur heard. ?  No friction rub. No gallop.  ?Pulmonary:  ?   Effort: Pulmonary effort is normal. No respiratory distress.  ?   Breath sounds: Normal breath sounds. No stridor. No wheezing, rhonchi or rales.  ?Abdominal:  ?   General: Abdomen is flat.  ?   Hernia: A hernia is present. Hernia is present in the left inguinal area. There is no hernia in the right inguinal area.  ?Musculoskeletal:     ?   General: Normal range of motion.  ?   Cervical back: Normal range of motion.  ?Skin: ?   General: Skin is warm and dry.  ?Neurological:  ?  General: No focal deficit present.  ?   Mental Status: He is alert and  oriented to person, place, and time.  ?Psychiatric:     ?   Mood and Affect: Mood normal.     ?   Thought Content: Thought content normal.  ?  ?  ?  ?  ?Assessment and Plan: ?Diagnoses and all orders for this visit: ?  ?Unilateral recurrent inguinal hernia without obstruction or gangrene ?  ?Unilateral inguinal hernia without obstruction or gangrene, recurrence not specified ?  ?  ?Colton Guzman is a 61 y.o. male  ?We will obtain cardiac clearance from Dr. Johney Frame prior to scheduling surgery.  Patient will also need to be off his Eliquis for at least 2 days. ?  ?  ?1.          We will proceed to the OR for a robotic left inguinal hernia repair with mesh. ?2.         All risks and benefits were discussed with the patient, to generally include infection, bleeding, damage to surrounding structures, acute and chronic nerve pain, and recurrence. Alternatives were offered and described.  All questions were answered and the patient voiced understanding of the procedure and wishes to proceed at this point. ?

## 2021-05-09 ENCOUNTER — Ambulatory Visit (HOSPITAL_BASED_OUTPATIENT_CLINIC_OR_DEPARTMENT_OTHER): Payer: Managed Care, Other (non HMO) | Admitting: Anesthesiology

## 2021-05-09 ENCOUNTER — Ambulatory Visit (HOSPITAL_COMMUNITY): Payer: Managed Care, Other (non HMO) | Admitting: Emergency Medicine

## 2021-05-09 ENCOUNTER — Ambulatory Visit (HOSPITAL_COMMUNITY)
Admission: RE | Admit: 2021-05-09 | Discharge: 2021-05-09 | Disposition: A | Discharge status: Home / Self Care | Payer: Managed Care, Other (non HMO) | Attending: General Surgery | Admitting: General Surgery

## 2021-05-09 ENCOUNTER — Other Ambulatory Visit: Payer: Self-pay

## 2021-05-09 ENCOUNTER — Encounter (HOSPITAL_COMMUNITY)
Admission: RE | Disposition: A | Discharge status: Home / Self Care | Payer: Self-pay | Source: Home / Self Care | Attending: General Surgery

## 2021-05-09 ENCOUNTER — Encounter (HOSPITAL_COMMUNITY): Payer: Self-pay | Admitting: General Surgery

## 2021-05-09 DIAGNOSIS — E119 Type 2 diabetes mellitus without complications: Secondary | ICD-10-CM

## 2021-05-09 DIAGNOSIS — K4030 Unilateral inguinal hernia, with obstruction, without gangrene, not specified as recurrent: Secondary | ICD-10-CM

## 2021-05-09 DIAGNOSIS — Z79899 Other long term (current) drug therapy: Secondary | ICD-10-CM | POA: Insufficient documentation

## 2021-05-09 DIAGNOSIS — Z7984 Long term (current) use of oral hypoglycemic drugs: Secondary | ICD-10-CM | POA: Diagnosis not present

## 2021-05-09 DIAGNOSIS — Z7901 Long term (current) use of anticoagulants: Secondary | ICD-10-CM | POA: Diagnosis not present

## 2021-05-09 DIAGNOSIS — Z87891 Personal history of nicotine dependence: Secondary | ICD-10-CM | POA: Diagnosis not present

## 2021-05-09 DIAGNOSIS — I2510 Atherosclerotic heart disease of native coronary artery without angina pectoris: Secondary | ICD-10-CM | POA: Insufficient documentation

## 2021-05-09 DIAGNOSIS — Z951 Presence of aortocoronary bypass graft: Secondary | ICD-10-CM | POA: Diagnosis not present

## 2021-05-09 DIAGNOSIS — I10 Essential (primary) hypertension: Secondary | ICD-10-CM | POA: Diagnosis not present

## 2021-05-09 DIAGNOSIS — M1990 Unspecified osteoarthritis, unspecified site: Secondary | ICD-10-CM | POA: Insufficient documentation

## 2021-05-09 DIAGNOSIS — Z794 Long term (current) use of insulin: Secondary | ICD-10-CM | POA: Diagnosis not present

## 2021-05-09 HISTORY — PX: INSERTION OF MESH: SHX5868

## 2021-05-09 HISTORY — PX: XI ROBOTIC ASSISTED INGUINAL HERNIA REPAIR WITH MESH: SHX6706

## 2021-05-09 LAB — GLUCOSE, CAPILLARY
Glucose-Capillary: 198 mg/dL — ABNORMAL HIGH (ref 70–99)
Glucose-Capillary: 200 mg/dL — ABNORMAL HIGH (ref 70–99)

## 2021-05-09 SURGERY — REPAIR, HERNIA, INGUINAL, ROBOT-ASSISTED, LAPAROSCOPIC, USING MESH
Anesthesia: General | Site: Inguinal | Laterality: Left

## 2021-05-09 MED ORDER — LACTATED RINGERS IV SOLN: INTRAVENOUS | Status: DC | PRN

## 2021-05-09 MED ORDER — ACETAMINOPHEN 500 MG PO TABS: 1000.0000 mg | ORAL_TABLET | Freq: Once | ORAL | Status: DC

## 2021-05-09 MED ORDER — ACETAMINOPHEN 500 MG PO TABS
1000.0000 mg | ORAL_TABLET | ORAL | Status: AC
  Administered 2021-05-09: 1000 mg via ORAL
  Filled 2021-05-09: qty 2

## 2021-05-09 MED ORDER — BUPIVACAINE HCL 0.25 % IJ SOLN
INTRAMUSCULAR | Status: DC | PRN
  Administered 2021-05-09: 8 mL

## 2021-05-09 MED ORDER — FENTANYL CITRATE (PF) 250 MCG/5ML IJ SOLN
INTRAMUSCULAR | Status: DC | PRN
  Administered 2021-05-09: 100 ug via INTRAVENOUS
  Administered 2021-05-09 (×2): 50 ug via INTRAVENOUS

## 2021-05-09 MED ORDER — INSULIN ASPART 100 UNIT/ML IJ SOLN
0.0000 [IU] | INTRAMUSCULAR | Status: DC | PRN
  Administered 2021-05-09: 4 [IU] via SUBCUTANEOUS

## 2021-05-09 MED ORDER — PROPOFOL 10 MG/ML IV BOLUS
INTRAVENOUS | Status: DC | PRN
  Administered 2021-05-09: 20 mg via INTRAVENOUS
  Administered 2021-05-09: 150 mg via INTRAVENOUS

## 2021-05-09 MED ORDER — CHLORHEXIDINE GLUCONATE CLOTH 2 % EX PADS: 6.0000 | MEDICATED_PAD | Freq: Once | CUTANEOUS | Status: DC

## 2021-05-09 MED ORDER — PHENYLEPHRINE HCL-NACL 20-0.9 MG/250ML-% IV SOLN
INTRAVENOUS | Status: DC | PRN
  Administered 2021-05-09: 25 ug/min via INTRAVENOUS

## 2021-05-09 MED ORDER — ORAL CARE MOUTH RINSE: 15.0000 mL | Freq: Once | OROMUCOSAL | Status: AC

## 2021-05-09 MED ORDER — OXYCODONE HCL 5 MG PO TABS: 5.0000 mg | ORAL_TABLET | Freq: Once | ORAL | Status: DC | PRN

## 2021-05-09 MED ORDER — ENSURE PRE-SURGERY PO LIQD: 296.0000 mL | Freq: Once | ORAL | Status: DC

## 2021-05-09 MED ORDER — SODIUM CHLORIDE 0.9 % IV SOLN
INTRAVENOUS | Status: DC | PRN
  Administered 2021-05-09: 40 mL

## 2021-05-09 MED ORDER — TRAMADOL HCL 50 MG PO TABS: 50.0000 mg | ORAL_TABLET | Freq: Four times a day (QID) | ORAL | 0 refills | Status: DC | PRN

## 2021-05-09 MED ORDER — CHLORHEXIDINE GLUCONATE 0.12 % MT SOLN
15.0000 mL | Freq: Once | OROMUCOSAL | Status: AC
  Administered 2021-05-09: 15 mL via OROMUCOSAL
  Filled 2021-05-09: qty 15

## 2021-05-09 MED ORDER — CEFAZOLIN SODIUM-DEXTROSE 2-4 GM/100ML-% IV SOLN
2.0000 g | INTRAVENOUS | Status: AC
  Administered 2021-05-09: 2 g via INTRAVENOUS
  Filled 2021-05-09: qty 100

## 2021-05-09 MED ORDER — SUGAMMADEX SODIUM 200 MG/2ML IV SOLN
INTRAVENOUS | Status: DC | PRN
  Administered 2021-05-09: 320 mg via INTRAVENOUS

## 2021-05-09 MED ORDER — AMISULPRIDE (ANTIEMETIC) 5 MG/2ML IV SOLN: 10.0000 mg | Freq: Once | INTRAVENOUS | Status: DC | PRN

## 2021-05-09 MED ORDER — PHENYLEPHRINE 40 MCG/ML (10ML) SYRINGE FOR IV PUSH (FOR BLOOD PRESSURE SUPPORT)
PREFILLED_SYRINGE | INTRAVENOUS | Status: DC | PRN
  Administered 2021-05-09: 80 ug via INTRAVENOUS

## 2021-05-09 MED ORDER — OXYCODONE HCL 5 MG/5ML PO SOLN: 5.0000 mg | Freq: Once | ORAL | Status: DC | PRN

## 2021-05-09 MED ORDER — MIDAZOLAM HCL 5 MG/5ML IJ SOLN
INTRAMUSCULAR | Status: DC | PRN
  Administered 2021-05-09: 2 mg via INTRAVENOUS

## 2021-05-09 MED ORDER — LACTATED RINGERS IV SOLN: INTRAVENOUS | Status: DC

## 2021-05-09 MED ORDER — INSULIN ASPART 100 UNIT/ML IJ SOLN
INTRAMUSCULAR | Status: AC
  Filled 2021-05-09: qty 1

## 2021-05-09 MED ORDER — ONDANSETRON HCL 4 MG/2ML IJ SOLN: 4.0000 mg | Freq: Once | INTRAMUSCULAR | Status: DC | PRN

## 2021-05-09 MED ORDER — LIDOCAINE 2% (20 MG/ML) 5 ML SYRINGE
INTRAMUSCULAR | Status: DC | PRN
  Administered 2021-05-09: 80 mg via INTRAVENOUS

## 2021-05-09 MED ORDER — DEXAMETHASONE SODIUM PHOSPHATE 10 MG/ML IJ SOLN
INTRAMUSCULAR | Status: DC | PRN
Start: 2021-05-09 — End: 2021-05-09
  Administered 2021-05-09: 4 mg via INTRAVENOUS

## 2021-05-09 MED ORDER — 0.9 % SODIUM CHLORIDE (POUR BTL) OPTIME
TOPICAL | Status: DC | PRN
Start: 2021-05-09 — End: 2021-05-09
  Administered 2021-05-09: 1000 mL

## 2021-05-09 MED ORDER — ONDANSETRON HCL 4 MG/2ML IJ SOLN
INTRAMUSCULAR | Status: DC | PRN
  Administered 2021-05-09: 4 mg via INTRAVENOUS

## 2021-05-09 MED ORDER — ROCURONIUM BROMIDE 10 MG/ML (PF) SYRINGE
PREFILLED_SYRINGE | INTRAVENOUS | Status: DC | PRN
Start: 2021-05-09 — End: 2021-05-09
  Administered 2021-05-09: 20 mg via INTRAVENOUS
  Administered 2021-05-09: 80 mg via INTRAVENOUS

## 2021-05-09 MED ORDER — FENTANYL CITRATE (PF) 100 MCG/2ML IJ SOLN: 25.0000 ug | INTRAMUSCULAR | Status: DC | PRN

## 2021-05-09 SURGICAL SUPPLY — 56 items
BAG COUNTER SPONGE SURGICOUNT (BAG) IMPLANT
CHLORAPREP W/TINT 26 (MISCELLANEOUS) ×2 IMPLANT
COVER MAYO STAND STRL (DRAPES) ×2 IMPLANT
COVER SURGICAL LIGHT HANDLE (MISCELLANEOUS) ×2 IMPLANT
COVER TIP SHEARS 8 DVNC (MISCELLANEOUS) ×1 IMPLANT
COVER TIP SHEARS 8MM DA VINCI (MISCELLANEOUS) ×1
DECANTER SPIKE VIAL GLASS SM (MISCELLANEOUS) ×2 IMPLANT
DEFOGGER SCOPE WARMER CLEARIFY (MISCELLANEOUS) ×2 IMPLANT
DERMABOND ADVANCED (GAUZE/BANDAGES/DRESSINGS) ×1
DERMABOND ADVANCED .7 DNX12 (GAUZE/BANDAGES/DRESSINGS) ×1 IMPLANT
DEVICE TROCAR PUNCTURE CLOSURE (ENDOMECHANICALS) ×2 IMPLANT
DRAPE ARM DVNC X/XI (DISPOSABLE) ×4 IMPLANT
DRAPE COLUMN DVNC XI (DISPOSABLE) ×1 IMPLANT
DRAPE CV SPLIT W-CLR ANES SCRN (DRAPES) ×2 IMPLANT
DRAPE DA VINCI XI ARM (DISPOSABLE) ×4
DRAPE DA VINCI XI COLUMN (DISPOSABLE) ×1
DRAPE ORTHO SPLIT 77X108 STRL (DRAPES) ×1
DRAPE SURG ORHT 6 SPLT 77X108 (DRAPES) ×1 IMPLANT
ELECT REM PT RETURN 9FT ADLT (ELECTROSURGICAL) ×2
ELECTRODE REM PT RTRN 9FT ADLT (ELECTROSURGICAL) ×1 IMPLANT
GLOVE SURG SYN 7.5  E (GLOVE) ×3
GLOVE SURG SYN 7.5 E (GLOVE) ×3 IMPLANT
GLOVE SURG SYN 7.5 PF PI (GLOVE) ×3 IMPLANT
GOWN STRL REUS W/ TWL LRG LVL3 (GOWN DISPOSABLE) ×2 IMPLANT
GOWN STRL REUS W/ TWL XL LVL3 (GOWN DISPOSABLE) ×2 IMPLANT
GOWN STRL REUS W/TWL 2XL LVL3 (GOWN DISPOSABLE) ×2 IMPLANT
GOWN STRL REUS W/TWL LRG LVL3 (GOWN DISPOSABLE) ×2
GOWN STRL REUS W/TWL XL LVL3 (GOWN DISPOSABLE) ×2
KIT BASIN OR (CUSTOM PROCEDURE TRAY) ×2 IMPLANT
KIT TURNOVER KIT B (KITS) ×2 IMPLANT
MARKER SKIN DUAL TIP RULER LAB (MISCELLANEOUS) ×2 IMPLANT
MESH PROGRIP LAP SELF FIXATING (Mesh General) ×1 IMPLANT
MESH PROGRIP LAP SLF FIX 16X12 (Mesh General) ×1 IMPLANT
NDL INSUFFLATION 14GA 120MM (NEEDLE) ×1 IMPLANT
NEEDLE HYPO 22GX1.5 SAFETY (NEEDLE) ×2 IMPLANT
NEEDLE INSUFFLATION 14GA 120MM (NEEDLE) ×2 IMPLANT
OBTURATOR OPTICAL STANDARD 8MM (TROCAR)
OBTURATOR OPTICAL STND 8 DVNC (TROCAR)
OBTURATOR OPTICALSTD 8 DVNC (TROCAR) IMPLANT
PAD ARMBOARD 7.5X6 YLW CONV (MISCELLANEOUS) ×4 IMPLANT
SEAL CANN UNIV 5-8 DVNC XI (MISCELLANEOUS) ×2 IMPLANT
SEAL XI 5MM-8MM UNIVERSAL (MISCELLANEOUS) ×2
SET IRRIG TUBING LAPAROSCOPIC (IRRIGATION / IRRIGATOR) IMPLANT
SET TUBE SMOKE EVAC HIGH FLOW (TUBING) ×2 IMPLANT
STOPCOCK 4 WAY LG BORE MALE ST (IV SETS) ×2 IMPLANT
SUT MNCRL AB 4-0 PS2 18 (SUTURE) ×2 IMPLANT
SUT VIC AB 2-0 SH 27 (SUTURE)
SUT VIC AB 2-0 SH 27X BRD (SUTURE) IMPLANT
SUT VLOC 180 2-0 6IN GS21 (SUTURE) ×2 IMPLANT
SUT VLOC 180 2-0 9IN GS21 (SUTURE) ×1 IMPLANT
SYR 30ML SLIP (SYRINGE) ×2 IMPLANT
SYR TOOMEY 50ML (SYRINGE) ×1 IMPLANT
TOWEL GREEN STERILE FF (TOWEL DISPOSABLE) ×2 IMPLANT
TRAY FOLEY MTR SLVR 14FR STAT (SET/KITS/TRAYS/PACK) IMPLANT
TRAY FOLEY MTR SLVR 16FR STAT (SET/KITS/TRAYS/PACK) IMPLANT
TRAY LAPAROSCOPIC MC (CUSTOM PROCEDURE TRAY) ×2 IMPLANT

## 2021-05-09 NOTE — Transfer of Care (Signed)
Immediate Anesthesia Transfer of Care Note ? ?Patient: Colton Guzman. ? ?Procedure(s) Performed: XI ROBOTIC ASSISTED LEFT INGUINAL HERNIA REPAIR WITH MESH (Left: Inguinal) ?INSERTION OF MESH (Left: Inguinal) ? ?Patient Location: PACU ? ?Anesthesia Type:General ? ?Level of Consciousness: awake, alert  and oriented ? ?Airway & Oxygen Therapy: Patient Spontanous Breathing and Patient connected to nasal cannula oxygen ? ?Post-op Assessment: Report given to RN and Post -op Vital signs reviewed and stable ? ?Post vital signs: Reviewed and stable ? ?Last Vitals:  ?Vitals Value Taken Time  ?BP 144/92   ?Temp    ?Pulse 70 05/09/21 0846  ?Resp    ?SpO2 99 % 05/09/21 0846  ?Vitals shown include unvalidated device data. ? ?Last Pain:  ?Vitals:  ? 05/09/21 0620  ?TempSrc:   ?PainSc: 0-No pain  ?   ? ?  ? ?Complications: No notable events documented. ?

## 2021-05-09 NOTE — Anesthesia Procedure Notes (Addendum)
Procedure Name: Intubation ?Date/Time: 05/09/2021 7:28 AM ?Performed by: Wilburn Cornelia, CRNA ?Pre-anesthesia Checklist: Patient identified, Emergency Drugs available, Suction available, Patient being monitored and Timeout performed ?Patient Re-evaluated:Patient Re-evaluated prior to induction ?Oxygen Delivery Method: Circle system utilized ?Preoxygenation: Pre-oxygenation with 100% oxygen ?Induction Type: IV induction ?Ventilation: Mask ventilation without difficulty ?Laryngoscope Size: Mac and 4 ?Grade View: Grade I ?Tube type: Oral ?Tube size: 7.5 mm ?Number of attempts: 1 ?Airway Equipment and Method: Stylet ?Placement Confirmation: ETT inserted through vocal cords under direct vision, positive ETCO2, CO2 detector and breath sounds checked- equal and bilateral ?Secured at: 23 cm ?Tube secured with: Tape ?Dental Injury: Teeth and Oropharynx as per pre-operative assessment  ? ? ? ? ?

## 2021-05-09 NOTE — Interval H&P Note (Signed)
History and Physical Interval Note: ? ?05/09/2021 ?7:12 AM ? ?Colton Guzman.  has presented today for surgery, with the diagnosis of LEFT INGUINAL HERNIA.  The various methods of treatment have been discussed with the patient and family. After consideration of risks, benefits and other options for treatment, the patient has consented to  Procedure(s): ?XI ROBOTIC [place] (Left) as a surgical intervention.  The patient's history has been reviewed, patient examined, no change in status, stable for surgery.  I have reviewed the patient's chart and labs.  Questions were answered to the patient's satisfaction.   ? ? ?Ralene Ok ? ? ?

## 2021-05-09 NOTE — Discharge Instructions (Signed)
[facility] _______[facility] ? ?INGUINAL HERNIA REPAIR: POST OP INSTRUCTIONS ? ?Always review your discharge instruction sheet given to you by the facility where your surgery was performed. ?IF YOU HAVE DISABILITY OR FAMILY LEAVE FORMS, YOU MUST BRING THEM TO THE OFFICE FOR PROCESSING.   ?DO NOT GIVE THEM TO YOUR DOCTOR. ? ?1. A  prescription for pain medication may be given to you upon discharge.  Take your pain medication as prescribed, if needed.  If narcotic pain medicine is not needed, then you may take acetaminophen (Tylenol) or ibuprofen (Advil) as needed. ?2. Take your usually prescribed medications unless otherwise directed. ?If you need a refill on your pain medication, please contact your pharmacy.  They will contact our office to request authorization. Prescriptions will not be filled after 5 pm or on week-ends. ?3. You should follow a light diet the first 24 hours after arrival home, such as soup and crackers, etc.  Be sure to include lots of fluids daily.  Resume your normal diet the day after surgery. ?4.Most patients will experience some swelling and bruising around the umbilicus or in the groin and scrotum.  Ice packs and reclining will help.  Swelling and bruising can take several days to resolve.  ?6. It is common to experience some constipation if taking pain medication after surgery.  Increasing fluid intake and taking a stool softener (such as Colace) will usually help or prevent this problem from occurring.  A mild laxative (Milk of Magnesia or Miralax) should be taken according to package directions if there are no bowel movements after 48 hours. ?7. Unless discharge instructions indicate otherwise, you may remove your bandages 24-48 hours after surgery, and you may shower at that time.  You may have steri-strips (small skin tapes) in place directly over the incision.  These strips should be left on the skin for 7-10 days.  If your surgeon used skin glue on the incision, you may  shower in 24 hours.  The glue will flake off over the next 2-3 weeks.  Any sutures or staples will be removed at the office during your follow-up visit. ?8. ACTIVITIES:  You may resume regular (light) daily activities beginning the next day--such as daily self-care, walking, climbing stairs--gradually increasing activities as tolerated.  You may have sexual intercourse when it is comfortable.  Refrain from any heavy lifting or straining until approved by your doctor. ? ?a.You may drive when you are no longer taking prescription pain medication, you can comfortably wear a seatbelt, and you can safely maneuver your car and apply brakes. ?b.RETURN TO WORK:   ?_____________________________________________ ? ?9.You should see your doctor in the office for a follow-up appointment approximately 2-3 weeks after your surgery.  Make sure that you call for this appointment within a day or two after you arrive home to insure a convenient appointment time. ?10.OTHER INSTRUCTIONS: _________________________ ?   _____________________________________ ? ?WHEN TO CALL YOUR DOCTOR: ?Fever over 101.0 ?Inability to urinate ?Nausea and/or vomiting ?Extreme swelling or bruising ?Continued bleeding from incision. ?Increased pain, redness, or drainage from the incision ? ?The clinic staff is available to answer your questions during regular business hours.  Please don?t hesitate to call and ask to speak to one of the nurses for clinical concerns.  If you have a medical emergency, go to the nearest emergency room or call 911.  A surgeon from [facility] is always on call at the hospital ? ? ?[address], [address], [place], [place]    [address] ? ? [address] [address], [place], [place]   [address] ?[phone] ? [phone] ? FAX [phone] ?Web site: [email or web address] ? ?

## 2021-05-09 NOTE — Op Note (Signed)
05/09/2021 ? ?8:32 AM ? ?PATIENT:  Colton Guzman.  61 y.o. male ? ?PRE-OPERATIVE DIAGNOSIS: INCARCERATED LEFT INGUINAL HERNIA ? ?POST-OPERATIVE DIAGNOSIS:  INCARCERATED, SLIDING LEFT INDIRECT INGUINAL HERNIA ? ?PROCEDURE:  Procedure(s): ?XI ROBOTIC ASSISTED LEFT INGUINAL HERNIA REPAIR WITH MESH (Left) ?INSERTION OF MESH (Left) ? ?SURGEON:  Surgeon(s) and Role: ?   Ralene Ok, MD - Primary ? ?ASSISTANTS: Pryor Curia, RNFA  ? ?ANESTHESIA:   local and general ? ?EBL:  minimal  ? ?BLOOD ADMINISTERED:none ? ?DRAINS: none  ? ?LOCAL MEDICATIONS USED:  BUPIVICAINE  and OTHER exparel ? ?SPECIMEN:  No Specimen ? ?DISPOSITION OF SPECIMEN:  N/A ? ?COUNTS:  YES ? ?TOURNIQUET:  * No tourniquets in log * ? ?DICTATION: .Dragon Dictation ? ?Findings: Large sliding indirect inguinal hernia with colon ? ?Details of the procedure:  ? ?The patient was taken back to the operating room. The patient was placed in supine position with bilateral SCDs in place.  A Foley catheter was placed.  The patient was prepped and draped in the usual sterile fashion.  After appropriate anitbiotics were confirmed, a time-out was confirmed and all facts were verified. ? ?At this time a Veress needle technique was used to inspect the abdomen approximately 10 cm from the valgus and the paramedian line. This time a 8 mm robotic trocar was placed into the abdomen. The camera was placed there was no injury to any intra-abdominal organs. A 72mm umbilical port was placed just superior to the umbilicus. An 8 mm port was placed approximately 10 cm lateral to the umbilicus on the left paramedian side.  ? ?Robot was positioned over the patient and the ports were docked in the usual fashion. ? ?At this time the left-sided peritoneum was taken down from the medial umbilical ligament laterally. The pre-peritoneal space was entered. Dissection was taken down to Cooper's ligament. At this time it was apparent there was an indirect hernia.  At this time  I proceeded dissect out the rest Cooper's ligament and the medial to lateral direction. I proceeded laterally to dissect the spermatic cord. The spermatic cord was circumferentially dissected away from the surrounding musculature and tissue. The vas deferens was identified and protected all portions of the case. There was a large indirect hernia.  The hernia sac was dissected away from the peritoneum. This was dissected back.  There was a small cord lipoma that was dissected back from the internal inguinal ring. At this time I proceeded to create a pocket laterally for the mesh. Once the pocket was created the peritoneum was stripped back to approximately the base of the cord. At this time the piece of Progrip 16x12cm mesh was in placed into the dissected area. This covered both the direct and indirect spaces appropriately. This also covered  The femoral space.  The mesh lay flat from medial to lateral. At this time a 2 V- lock stitch was used to close the peritoneum in a standard running fashion. ? ?At this time the robot was undocked. The umbilical port site was reapproximated using a 0 Vicryl via an Endo Close device ?2. All ports were removed. The skin was reapproximated and all port sites using 4-0 Monocryl subcuticular fashion. ? ?The patient the procedure well was taken to the recovery  ? ? ?PLAN OF CARE: Discharge to home after PACU ? ?PATIENT DISPOSITION:  PACU - hemodynamically stable. ?  ?Delay start of Pharmacological VTE agent (>24hrs) due to surgical blood loss or risk of bleeding: not applicable ? ?

## 2021-05-09 NOTE — Anesthesia Postprocedure Evaluation (Signed)
Anesthesia Post Note ? ?Patient: Colton Guzman Bettey Mare. ? ?Procedure(s) Performed: XI ROBOTIC ASSISTED LEFT INGUINAL HERNIA REPAIR WITH MESH (Left: Inguinal) ?INSERTION OF MESH (Left: Inguinal) ? ?  ? ?Patient location during evaluation: PACU ?Anesthesia Type: General ?Level of consciousness: awake ?Pain management: pain level controlled ?Vital Signs Assessment: post-procedure vital signs reviewed and stable ?Respiratory status: spontaneous breathing and respiratory function stable ?Cardiovascular status: stable ?Postop Assessment: no apparent nausea or vomiting ?Anesthetic complications: no ? ? ?No notable events documented. ? ?Last Vitals:  ?Vitals:  ? 05/09/21 0900 05/09/21 0915  ?BP: 135/87 134/83  ?Pulse: 73 67  ?Resp: 20 16  ?Temp:  (!) 36.3 ?C  ?SpO2: 95% 95%  ?  ?Last Pain:  ?Vitals:  ? 05/09/21 0915  ?TempSrc:   ?PainSc: 0-No pain  ? ? ?  ?  ?  ?  ?  ?  ? ?Merlinda Frederick ? ? ? ? ?

## 2021-05-10 ENCOUNTER — Encounter (HOSPITAL_COMMUNITY): Payer: Self-pay | Admitting: General Surgery

## 2021-05-14 NOTE — Progress Notes (Deleted)
?Cardiology Office Note:   ? ?Date:  05/14/2021  ? ?ID:  Colton Dell., DOB 1960-03-12, MRN [id number] ? ?PCP:  [name], MD ?  ?[place]  ?Cardiologist:  Freada Bergeron, MD  ?Advanced Practice Provider:  No care team member to display ?Electrophysiologist:  Vickie Epley, MD  ? ? ?Referring MD: [name], MD  ? ? ?History of Present Illness:   ? ?Colton Borbe Downum Brooke Bonito. is a 61 y.o. male with a hx of DMII, HLD and HTN and recent prolonged admission at [facility] hospital where he presented with hematochezia with course complicated by chest pain/VT arrest requiring CPR later found to have multivessel CAD s/p CABG on 03/08/20 who now presents to clinic for follow-up. ?  ?Patient was hospitalized at [facility] from 02/22/20-03/13/20 where he presented with hematochezia from diverticular source. On 02/23/20, the patient developed chest pain in the setting of a blood bowel movement that initially responded to SL NTG but then recurred with subsequent VT arrest requiring 2-66min CPR and shock. Post-shock rhythm Afib. Trop peaked at [address]. He was stabilized from GIB standpoint and underwent coronary angiography on 03/04/20 which showed 70% stenosis of the proximal RCA with 50% stenosis of the distal RCA, 70% stenosis of the right PDA, 99% stenosis of the third right PL, 99% stenosis of the second right PL, 90% stenosis of the mid LAD with an ostial LAD stenosis of 70%, 100% stenosis of the ostial to proximal circumflex, and 60% stenosis of the mid LM to ostial LAD.  Due to his severe proximal LAD stenosis and severe mid LAD stenosis coronary bypass grafting was recommended. He underwent successful CABG x5 on 03/08/20 with LIMA to distal LAD, SVG to PDA., SVG tp posterior lateral branch of the right coronary artery, left radial artery to the ramus intermediate and first obtuse marginal coronary arteries.  Following the procedure, he separated from cardiopulmonary bypass without  difficulty. He did well and was successfully discharged home on 03/13/20. ? ?Seen by me on 03/28/20 where he was doing well. Obtained cardiac monitor which showed <1% burden of Afib, 3% burden of PVCs/NSVT. Given Afib outside of the post-operative setting, his plavix was stopped and he was placed on apixaban for [facility].  ? ?Was hospitalized at [facility] from 08/12/20-08/18/20 with GIB. During admission, he required 6units pRBCs total. His ASA and apixaban were initially held. Colonoscopy revealed severe diverticulosis but no active bleeding. EGD with nonbleeding gastric ulcers. Tagged RBC scan was also negative. Patient overall improved and H/H remained stable with conservative management with PPI. He was resumed on his apixaban which he tolerated and was discharged home. ? ?He was evaluated by Dr. Quentin Ore for consideration of Watchman however, prewatchman implant CT scan which showed very difficult anatomy with a very shallow landing zone for the watchman device.  After three-dimensional reconstruction of the appendage, he was deemed poor Watchman candidate. May be considered for amulet in the future. ? ?Today, *** ?Past Medical History:  ?Diagnosis Date  ? Anemia   ? hx iron transfusion  ? Arthritis   ? left shoulder  ? CAD (coronary artery disease) of artery bypass graft 02/2020  ? Diabetes mellitus without complication ([place])   ? type 2  ? Elevated blood uric acid level   ? GI bleed   ? History of blood transfusion 08/19/2020  ? Hyperlipidemia LDL goal <70   ? Hypertension   ? PAF (paroxysmal atrial fibrillation) ([place])   ? Wears  partial dentures   ? upper dent/lower partial  ? ? ?Past Surgical History:  ?Procedure Laterality Date  ? BIOPSY  08/17/2020  ? Procedure: BIOPSY;  Surgeon: Mauri Pole, MD;  Location: [place];  Service: Endoscopy;;  ? COLONOSCOPY WITH PROPOFOL Left 02/27/2020  ? Procedure: COLONOSCOPY WITH PROPOFOL;  Surgeon: Otis Brace, MD;  Location: [facility] ENDOSCOPY;  Service: Gastroenterology;   Laterality: Left;  ? COLONOSCOPY WITH PROPOFOL N/A 08/13/2020  ? Procedure: COLONOSCOPY WITH PROPOFOL;  Surgeon: Gatha Mayer, MD;  Location: [facility] ENDOSCOPY;  Service: Endoscopy;  Laterality: N/A;  ? CORONARY ARTERY BYPASS GRAFT N/A 03/08/2020  ? Procedure: CORONARY ARTERY BYPASS GRAFTING (CABG) x 5 ON PUMP;  Surgeon: Wonda Olds, MD;  Location: [facility] OR;  Service: Open Heart Surgery;  Laterality: N/A;  ? ENDOVEIN HARVEST OF GREATER SAPHENOUS VEIN Right 03/08/2020  ? Procedure: ENDOVEIN HARVEST OF GREATER SAPHENOUS VEIN;  Surgeon: Wonda Olds, MD;  Location: [place];  Service: Open Heart Surgery;  Laterality: Right;  ? ESOPHAGOGASTRODUODENOSCOPY (EGD) WITH PROPOFOL N/A 02/27/2020  ? Procedure: ESOPHAGOGASTRODUODENOSCOPY (EGD) WITH PROPOFOL;  Surgeon: Otis Brace, MD;  Location: [facility] ENDOSCOPY;  Service: Gastroenterology;  Laterality: N/A;  ? ESOPHAGOGASTRODUODENOSCOPY (EGD) WITH PROPOFOL N/A 08/17/2020  ? Procedure: ESOPHAGOGASTRODUODENOSCOPY (EGD) WITH PROPOFOL;  Surgeon: Mauri Pole, MD;  Location: [place] ENDOSCOPY;  Service: Endoscopy;  Laterality: N/A;  ? INSERTION OF MESH Left 05/09/2021  ? Procedure: INSERTION OF MESH;  Surgeon: Ralene Ok, MD;  Location: [place];  Service: General;  Laterality: Left;  ? LEFT HEART CATH AND CORONARY ANGIOGRAPHY N/A 03/04/2020  ? Procedure: LEFT HEART CATH AND CORONARY ANGIOGRAPHY;  Surgeon: Burnell Blanks, MD;  Location: [place] CV LAB;  Service: Cardiovascular;  Laterality: N/A;  ? RADIAL ARTERY HARVEST Left 03/08/2020  ? Procedure: RADIAL ARTERY HARVEST;  Surgeon: Wonda Olds, MD;  Location: [place];  Service: Open Heart Surgery;  Laterality: Left;  ? TEE WITHOUT CARDIOVERSION N/A 03/08/2020  ? Procedure: TRANSESOPHAGEAL ECHOCARDIOGRAM (TEE);  Surgeon: Wonda Olds, MD;  Location: [place];  Service: Open Heart Surgery;  Laterality: N/A;  ? XI ROBOTIC ASSISTED INGUINAL HERNIA REPAIR WITH MESH Left 05/09/2021  ? Procedure: XI ROBOTIC ASSISTED LEFT  INGUINAL HERNIA REPAIR WITH MESH;  Surgeon: Ralene Ok, MD;  Location: [place];  Service: General;  Laterality: Left;  ? ? ?Current Medications: ?No outpatient medications have been marked as taking for the 05/24/21 encounter (Appointment) with Freada Bergeron, MD.  ?  ? ?Allergies:   Patient has no known allergies.  ? ?Social History  ? ?Socioeconomic History  ? Marital status: Married  ?  Spouse name: Not on file  ? Number of children: Not on file  ? Years of education: Not on file  ? Highest education level: Not on file  ?Occupational History  ? Not on file  ?Tobacco Use  ? Smoking status: Former  ?  Packs/day: 1.50  ?  Types: Cigarettes  ?  Quit date: 2012  ?  Years since quitting: 11.2  ? Smokeless tobacco: Never  ?Vaping Use  ? Vaping Use: Former  ? Quit date: 08/28/2010  ?Substance and Sexual Activity  ? Alcohol use: Not Currently  ?  Comment: stopped drinking 02/2020  ? Drug use: Yes  ?  Types: Marijuana  ?  Comment: over 30 yrs ago as of 05/03/21  ? Sexual activity: Not Currently  ?Other Topics Concern  ? Not on file  ?Social History Narrative  ? Not on  file  ? ?Social Determinants of Health  ? ?Financial Resource Strain: Not on file  ?Food Insecurity: Not on file  ?Transportation Needs: Not on file  ?Physical Activity: Not on file  ?Stress: Not on file  ?Social Connections: Not on file  ?  ? ?Family History: ?The patient's family history is not on file. ? ?ROS:   ?Please see the history of present illness.    ?Review of Systems  ?Constitutional:  Negative for chills and fever.  ?HENT:  Negative for hearing loss.   ?Eyes:  Negative for blurred vision and redness.  ?Respiratory:  Negative for shortness of breath.   ?Cardiovascular:  Negative for chest pain, palpitations, orthopnea, claudication, leg swelling and PND.  ?Gastrointestinal:  Negative for melena, nausea and vomiting.  ?Genitourinary:  Negative for dysuria and flank pain.  ?Musculoskeletal:  Negative for joint pain and myalgias.  ?Neurological:   Negative for dizziness and loss of consciousness.  ?Psychiatric/Behavioral:  Negative for substance abuse.   ? ?EKGs/Labs/Other Studies Reviewed:   ? ?The following studies were reviewed today: ?Cardiac

## 2021-05-16 ENCOUNTER — Inpatient Hospital Stay: Payer: Managed Care, Other (non HMO) | Attending: Family | Admitting: Family

## 2021-05-16 ENCOUNTER — Inpatient Hospital Stay: Payer: Managed Care, Other (non HMO)

## 2021-05-16 ENCOUNTER — Encounter: Payer: Self-pay | Admitting: Family

## 2021-05-16 ENCOUNTER — Other Ambulatory Visit: Payer: Self-pay

## 2021-05-16 VITALS — BP 116/79 | HR 85 | Temp 98.4°F | Resp 16 | Wt 177.0 lb

## 2021-05-16 DIAGNOSIS — D500 Iron deficiency anemia secondary to blood loss (chronic): Secondary | ICD-10-CM

## 2021-05-16 DIAGNOSIS — K922 Gastrointestinal hemorrhage, unspecified: Secondary | ICD-10-CM | POA: Diagnosis present

## 2021-05-16 LAB — RETICULOCYTES
Immature Retic Fract: 17.3 % — ABNORMAL HIGH (ref 2.3–15.9)
RBC.: 4.73 MIL/uL (ref 4.22–5.81)
Retic Count, Absolute: 88.5 10*3/uL (ref 19.0–186.0)
Retic Ct Pct: 1.9 % (ref 0.4–3.1)

## 2021-05-16 LAB — CBC WITH DIFFERENTIAL (CANCER CENTER ONLY)
Abs Immature Granulocytes: 0.09 10*3/uL — ABNORMAL HIGH (ref 0.00–0.07)
Basophils Absolute: 0.1 10*3/uL (ref 0.0–0.1)
Basophils Relative: 1 %
Eosinophils Absolute: 0.2 10*3/uL (ref 0.0–0.5)
Eosinophils Relative: 2 %
HCT: 41.9 % (ref 39.0–52.0)
Hemoglobin: 13.8 g/dL (ref 13.0–17.0)
Immature Granulocytes: 1 %
Lymphocytes Relative: 37 %
Lymphs Abs: 3.2 10*3/uL (ref 0.7–4.0)
MCH: 29.2 pg (ref 26.0–34.0)
MCHC: 32.9 g/dL (ref 30.0–36.0)
MCV: 88.6 fL (ref 80.0–100.0)
Monocytes Absolute: 0.6 10*3/uL (ref 0.1–1.0)
Monocytes Relative: 7 %
Neutro Abs: 4.5 10*3/uL (ref 1.7–7.7)
Neutrophils Relative %: 52 %
Platelet Count: 187 10*3/uL (ref 150–400)
RBC: 4.73 MIL/uL (ref 4.22–5.81)
RDW: 13.2 % (ref 11.5–15.5)
WBC Count: 8.7 10*3/uL (ref 4.0–10.5)
nRBC: 0 % (ref 0.0–0.2)

## 2021-05-16 NOTE — Progress Notes (Signed)
?Hematology and Oncology Follow Up Visit ? ?[place]. ?[id number] ?1960/03/05 61 y.o. ?05/16/2021 ? ? ?Principle Diagnosis:  ?Iron deficiency anemia due to intermittent GI blood loss ?  ?Current Therapy:        ?IV iron as indicated  ?  ?Interim History:  Colton Guzman is here today for follow-up. He is recuperating from having a left inguinal hernia repair with mesh last week. He is still a bit sore in his abdomen but states that he is feeling better.  ?He has not noted any blood loss. No abnormal bruising, no petechiae.  ?No fever, chills, n/v, cough, rash, dizziness, SOB, chest pain, abdominal pain or changes in bowel or bladder habits.  ?He has occasional palpitations which he states may be due to atrial fib.  ?No swelling, tenderness, numbness or tingling in his extremities at this time.  ?He occasionally has tingling in his feet that lasts up to 30 minutes. This resolves on it's own.  ?No falls or syncope reported.  ?He has been eating well and is staying well hydrated throughout the day. His weight is stable at 177 lbs.  ? ?ECOG Performance Status: 1 - Symptomatic but completely ambulatory ? ?Medications:  ?Allergies as of 05/16/2021   ?No Known Allergies ?  ? ?  ?Medication List  ?  ? ?  ? Accurate as of May 16, 2021  2:09 PM. If you have any questions, ask your nurse or doctor.  ?  ?  ? ?  ? ?ACCRUFeR 30 MG Caps ?Generic drug: Ferric Maltol ?Take 1 capsule by mouth in the morning and at bedtime. ?  ?acetaminophen 500 MG tablet ?Commonly known as: TYLENOL ?Take 1,000 mg by mouth every 4 (four) hours as needed (pain). ?  ?allopurinol 100 MG tablet ?Commonly known as: ZYLOPRIM ?TAKE 1 TABLET BY MOUTH EVERY DAY ?  ?apixaban 5 MG Tabs tablet ?Commonly known as: ELIQUIS ?Take 1 tablet (5 mg total) by mouth 2 (two) times daily. ?  ?Aspirin Low Dose 81 MG EC tablet ?Generic drug: aspirin ?Take 81 mg by mouth every evening. ?  ?atorvastatin 80 MG tablet ?Commonly known as: LIPITOR ?TAKE 1 TABLET BY MOUTH  EVERY DAY ?  ?ezetimibe 10 MG tablet ?Commonly known as: ZETIA ?Take 1 tablet (10 mg total) by mouth daily. ?  ?Farxiga 10 MG Tabs tablet ?Generic drug: dapagliflozin propanediol ?TAKE 1 TABLET BY MOUTH EVERY DAY BEFORE BREAKFAST ?  ?FreeStyle Molino 2 Reader Kerrin Mo ?1 Act by Does not apply route daily. ?  ?FreeStyle Libre 2 Sensor Misc ?1 Act by Does not apply route daily. ?  ?Gentle Iron 28-60-0.008-0.4 MG Caps ?Generic drug: Fe Bisgly-Vit C-Vit B12-FA ?Take 1 tablet by mouth in the morning. ?  ?Gvoke HypoPen 2-Pack 1 MG/0.2ML Soaj ?Generic drug: Glucagon ?Inject 1 Act into the skin daily as needed. ?  ?icosapent Ethyl 1 g capsule ?Commonly known as: Vascepa ?Take 2 capsules (2 g total) by mouth 2 (two) times daily. ?  ?Insulin Pen Needle 32G X 6 MM Misc ?1 Act by Does not apply route daily. ?  ?isosorbide mononitrate 30 MG 24 hr tablet ?Commonly known as: IMDUR ?Take 1 tablet (30 mg total) by mouth daily. ?  ?losartan 50 MG tablet ?Commonly known as: COZAAR ?Take 1 tablet (50 mg total) by mouth daily. ?  ?Magnesium 300 MG Caps ?Take 300 mg by mouth daily. ?  ?metFORMIN 1000 MG tablet ?Commonly known as: GLUCOPHAGE ?TAKE 1 TABLET (1,000 MG TOTAL) BY MOUTH TWICE  A DAY WITH FOOD ?  ?metoprolol succinate 25 MG 24 hr tablet ?Commonly known as: Toprol XL ?Take 1 tablet (25 mg total) by mouth daily. ?  ?multivitamin with minerals Tabs tablet ?Take 1 tablet by mouth daily. ?  ?pantoprazole 40 MG tablet ?Commonly known as: Protonix ?Please take 40 mg oral twice daily x30 days, then 40 mg oral daily. ?  ?Rybelsus 14 MG Tabs ?Generic drug: Semaglutide ?TAKE 1 TABLET BY MOUTH EVERY DAY ?  ?traMADol 50 MG tablet ?Commonly known as: Ultram ?Take 1 tablet (50 mg total) by mouth every 6 (six) hours as needed. ?  ?traMADol 50 MG tablet ?Commonly known as: Ultram ?Take 1 tablet (50 mg total) by mouth every 6 (six) hours as needed. ?  ?Tyler Aas FlexTouch 200 UNIT/ML FlexTouch Pen ?Generic drug: insulin degludec ?Inject 50 Units into  the skin daily. ?  ?vitamin B-12 1000 MCG tablet ?Commonly known as: CYANOCOBALAMIN ?Take 1,000 mcg by mouth in the morning. ?  ?Vitamin D (Ergocalciferol) 1.25 MG (50000 UNIT) Caps capsule ?Commonly known as: DRISDOL ?Take 50,000 Units by mouth every Tuesday. ?  ? ?  ? ? ?Allergies: No Known Allergies ? ?Past Medical History, Surgical history, Social history, and Family History were reviewed and updated. ? ?Review of Systems: ?All other 10 point review of systems is negative.  ? ?Physical Exam: ? vitals were not taken for this visit.  ? ?Wt Readings from Last 3 Encounters:  ?05/09/21 181 lb (82.1 kg)  ?05/03/21 181 lb 6.4 oz (82.3 kg)  ?03/28/21 184 lb (83.5 kg)  ? ? ?Ocular: Sclerae unicteric, pupils equal, round and reactive to light ?Ear-nose-throat: Oropharynx clear, dentition fair ?Lymphatic: No cervical or supraclavicular adenopathy ?Lungs no rales or rhonchi, good excursion bilaterally ?Heart regular rate and rhythm, no murmur appreciated ?Abd soft, nontender, positive bowel sounds ?MSK no focal spinal tenderness, no joint edema ?Neuro: non-focal, well-oriented, appropriate affect ?Breasts: Deferred  ? ?Lab Results  ?Component Value Date  ? WBC 6.5 05/03/2021  ? HGB 14.2 05/03/2021  ? HCT 44.0 05/03/2021  ? MCV 90.9 05/03/2021  ? PLT 191 05/03/2021  ? ?Lab Results  ?Component Value Date  ? FERRITIN 23.2 03/28/2021  ? IRON 66 03/28/2021  ? TIBC 443.8 03/28/2021  ? UIBC 369 01/18/2021  ? IRONPCTSAT 14.9 (L) 03/28/2021  ? ?Lab Results  ?Component Value Date  ? RETICCTPCT 1.6 01/18/2021  ? RBC 4.84 05/03/2021  ? ?No results found for: KPAFRELGTCHN, LAMBDASER, KAPLAMBRATIO ?No results found for: IGGSERUM, IGA, IGMSERUM ?No results found for: TOTALPROTELP, ALBUMINELP, A1GS, A2GS, BETS, BETA2SER, GAMS, MSPIKE, SPEI ?  Chemistry   ?   ?Component Value Date/Time  ? NA 140 05/03/2021 0943  ? NA 140 12/13/2020 1144  ? K 4.4 05/03/2021 0943  ? CL 103 05/03/2021 0943  ? CO2 27 05/03/2021 0943  ? BUN 13 05/03/2021 0943  ?  BUN 16 12/13/2020 1144  ? CREATININE 0.75 05/03/2021 0943  ? CREATININE 0.84 09/06/2020 1454  ?    ?Component Value Date/Time  ? CALCIUM 9.9 05/03/2021 0943  ? ALKPHOS 82 09/06/2020 1454  ? AST 18 09/06/2020 1454  ? ALT 21 09/06/2020 1454  ? BILITOT 0.4 09/06/2020 1454  ?  ? ? ? ?Impression and Plan: Mr. Colton Dirr. is a very pleasant 61 yo Filipino gentleman with recent diagnosis of iron deficiency secondary to acute GI blood loss.  ?Iron studies are pending.  ?Follow-up in 6 months.  ? ?Lottie Dawson, NP ?3/28/20232:09 PM ? ?

## 2021-05-17 ENCOUNTER — Other Ambulatory Visit: Payer: Self-pay | Admitting: Hematology & Oncology

## 2021-05-17 LAB — IRON AND IRON BINDING CAPACITY (CC-WL,HP ONLY)
Iron: 70 ug/dL (ref 45–182)
Saturation Ratios: 15 % — ABNORMAL LOW (ref 17.9–39.5)
TIBC: 456 ug/dL — ABNORMAL HIGH (ref 250–450)
UIBC: 386 ug/dL — ABNORMAL HIGH (ref 117–376)

## 2021-05-17 LAB — FERRITIN: Ferritin: 84 ng/mL (ref 24–336)

## 2021-05-18 ENCOUNTER — Telehealth: Payer: Self-pay

## 2021-05-18 NOTE — Telephone Encounter (Signed)
-----   Message from Volanda Napoleon, MD sent at 05/17/2021  2:02 PM EDT ----- ?Call and let her know that the iron level is on the low side.  Please have her set up with IV iron.  Thanks.  Pete ?

## 2021-05-20 ENCOUNTER — Other Ambulatory Visit: Payer: Self-pay | Admitting: Physician Assistant

## 2021-05-23 ENCOUNTER — Other Ambulatory Visit: Payer: Self-pay | Admitting: Physician Assistant

## 2021-05-23 ENCOUNTER — Other Ambulatory Visit: Payer: Self-pay

## 2021-05-23 MED ORDER — APIXABAN 5 MG PO TABS: 5.0000 mg | ORAL_TABLET | Freq: Two times a day (BID) | ORAL | 6 refills | Status: DC

## 2021-05-23 NOTE — Telephone Encounter (Signed)
Pt last saw Dr Quentin Ore 01/26/21, last labs 05/03/21 Creat 0.75, age 61, weight 80.3kg, based on specified criteria pt is on appropriate dosage of Eliquis 5mg  BID for afib.  Will refill rx.  ?

## 2021-05-24 ENCOUNTER — Encounter: Payer: Self-pay | Admitting: Cardiology

## 2021-05-24 ENCOUNTER — Ambulatory Visit (INDEPENDENT_AMBULATORY_CARE_PROVIDER_SITE_OTHER): Payer: Managed Care, Other (non HMO) | Admitting: Cardiology

## 2021-05-24 VITALS — BP 122/78 | HR 78 | Ht 68.0 in | Wt 173.4 lb

## 2021-05-24 DIAGNOSIS — I2510 Atherosclerotic heart disease of native coronary artery without angina pectoris: Secondary | ICD-10-CM

## 2021-05-24 DIAGNOSIS — K922 Gastrointestinal hemorrhage, unspecified: Secondary | ICD-10-CM | POA: Diagnosis not present

## 2021-05-24 DIAGNOSIS — I10 Essential (primary) hypertension: Secondary | ICD-10-CM

## 2021-05-24 DIAGNOSIS — E782 Mixed hyperlipidemia: Secondary | ICD-10-CM

## 2021-05-24 DIAGNOSIS — Z951 Presence of aortocoronary bypass graft: Secondary | ICD-10-CM | POA: Diagnosis not present

## 2021-05-24 DIAGNOSIS — I480 Paroxysmal atrial fibrillation: Secondary | ICD-10-CM

## 2021-05-24 DIAGNOSIS — E119 Type 2 diabetes mellitus without complications: Secondary | ICD-10-CM

## 2021-05-24 NOTE — Patient Instructions (Signed)
Medication Instructions:  ? ?Your physician recommends that you continue on your current medications as directed. Please refer to the Current Medication list given to you today. ? ?*If you need a refill on your cardiac medications before your next appointment, please call your pharmacy* ? ? ?Follow-Up: ?At [facility], you and your health needs are our priority.  As part of our continuing mission to provide you with exceptional heart care, we have created designated Provider Care Teams.  These Care Teams include your primary Cardiologist (physician) and Advanced Practice Providers (APPs -  Physician Assistants and Nurse Practitioners) who all work together to provide you with the care you need, when you need it. ? ?We recommend signing up for the patient portal called "MyChart".  Sign up information is provided on this After Visit Summary.  MyChart is used to connect with patients for Virtual Visits (Telemedicine).  Patients are able to view lab/test results, encounter notes, upcoming appointments, etc.  Non-urgent messages can be sent to your provider as well.   ?To learn more about what you can do with MyChart, go to [email or web address].   ? ?Your next appointment:   ?6 month(s) ? ?The format for your next appointment:   ?In Person ? ?Provider:   ?Freada Bergeron, MD { ? ? ?

## 2021-05-24 NOTE — Progress Notes (Signed)
?Cardiology Office Note:   ? ?Date:  05/24/2021  ? ?ID:  Colton Dell., DOB [birth year], MRN [id number] ? ?PCP:  [name], MD ?  ?[place]  ?Cardiologist:  Freada Bergeron, MD  ?Advanced Practice Provider:  No care team member to display ?Electrophysiologist:  Vickie Epley, MD  ? ? ?Referring MD: [name], MD  ? ? ?History of Present Illness:   ? ?Colton Guzman Colton Bonito. is a 61 y.o. male with a hx of DMII, HLD and HTN and recent prolonged admission at [facility] hospital where he presented with hematochezia with course complicated by chest pain/VT arrest requiring CPR later found to have multivessel CAD s/p CABG on 03/08/20 who now presents to clinic for follow-up. ?  ?Patient was hospitalized at [facility] from 02/22/20-03/13/20 where he presented with hematochezia from diverticular source. On 02/23/20, the patient developed chest pain in the setting of a blood bowel movement that initially responded to SL NTG but then recurred with subsequent VT arrest requiring 2-18min CPR and shock. Post-shock rhythm Afib. [place]rop [place] at [address]. He was stabilized from GIB standpoint and underwent coronary angiography on 03/04/20 which showed 70% stenosis of the proximal RCA with 50% stenosis of the distal RCA, 70% stenosis of the right PDA, 99% stenosis of the third right PL, 99% stenosis of the second right PL, 90% stenosis of the mid LAD with an ostial LAD stenosis of 70%, 100% stenosis of the ostial to proximal circumflex, and 60% stenosis of the mid LM to ostial LAD.  Due to his severe proximal LAD stenosis and severe mid LAD stenosis coronary bypass grafting was recommended. He underwent successful CABG x5 on 03/08/20 with LIMA to distal LAD, SVG to PDA., SVG tp posterior lateral branch of the right coronary artery, left radial artery to the ramus intermediate and first obtuse marginal coronary arteries.  Following the procedure, he separated from cardiopulmonary bypass without  difficulty. He did well and was successfully discharged home on 03/13/20. ? ?Seen by me on 03/28/20 where he was doing well. Obtained cardiac monitor which showed <1% burden of Afib, 3% burden of PVCs/NSVT. Given Afib outside of the post-operative setting, his plavix was stopped and he was placed on apixaban for [facility].  ? ?Was hospitalized at [facility] from 08/12/20-08/18/20 with GIB. During admission, he required 6units pRBCs total. His ASA and apixaban were initially held. Colonoscopy revealed severe diverticulosis but no active bleeding. EGD with nonbleeding gastric ulcers. Tagged RBC scan was also negative. Patient overall improved and H/H remained stable with conservative management with PPI. He was resumed on his apixaban which he tolerated and was discharged home. ? ?He was evaluated by Dr. Quentin Ore for consideration of Watchman however, prewatchman implant CT scan which showed very difficult anatomy with a very shallow landing zone for the watchman device. After three-dimensional reconstruction of the appendage, he was deemed a poor Watchman candidate. May be considered for amulet in the future. ? ?Today, he is doing well.  He underwent hernia surgery and is recovering well. He had to hold his medication for the procedure and did feel a few Afib episodes. Otherwise, he reports his CV symptoms have been improving. ? ?He is working on his diet to control his blood sugar and being more mindful of his sugar intake.  He enjoys walking for exercise. He is compliant and tolerating his medications. He denies chest pain, chest pressure, dyspnea at rest or with exertion, PND, orthopnea, or leg swelling.  ? ?  Past Medical History:  ?Diagnosis Date  ? Anemia   ? hx iron transfusion  ? Arthritis   ? left shoulder  ? CAD (coronary artery disease) of artery bypass graft 02/2020  ? Diabetes mellitus without complication ([place])   ? type 2  ? Elevated blood uric acid level   ? GI bleed   ? History of blood transfusion 08/19/2020  ?  Hyperlipidemia LDL goal <70   ? Hypertension   ? PAF (paroxysmal atrial fibrillation) ([place])   ? Wears partial dentures   ? upper dent/lower partial  ? ? ?Past Surgical History:  ?Procedure Laterality Date  ? BIOPSY  08/17/2020  ? Procedure: BIOPSY;  Surgeon: Mauri Pole, MD;  Location: [place];  Service: Endoscopy;;  ? COLONOSCOPY WITH PROPOFOL Left 02/27/2020  ? Procedure: COLONOSCOPY WITH PROPOFOL;  Surgeon: Otis Brace, MD;  Location: [facility] ENDOSCOPY;  Service: Gastroenterology;  Laterality: Left;  ? COLONOSCOPY WITH PROPOFOL N/A 08/13/2020  ? Procedure: COLONOSCOPY WITH PROPOFOL;  Surgeon: Gatha Mayer, MD;  Location: [facility] ENDOSCOPY;  Service: Endoscopy;  Laterality: N/A;  ? CORONARY ARTERY BYPASS GRAFT N/A 03/08/2020  ? Procedure: CORONARY ARTERY BYPASS GRAFTING (CABG) x 5 ON PUMP;  Surgeon: Wonda Olds, MD;  Location: [facility] OR;  Service: Open Heart Surgery;  Laterality: N/A;  ? ENDOVEIN HARVEST OF GREATER SAPHENOUS VEIN Right 03/08/2020  ? Procedure: ENDOVEIN HARVEST OF GREATER SAPHENOUS VEIN;  Surgeon: Wonda Olds, MD;  Location: [place];  Service: Open Heart Surgery;  Laterality: Right;  ? ESOPHAGOGASTRODUODENOSCOPY (EGD) WITH PROPOFOL N/A 02/27/2020  ? Procedure: ESOPHAGOGASTRODUODENOSCOPY (EGD) WITH PROPOFOL;  Surgeon: Otis Brace, MD;  Location: [facility];  Service: Gastroenterology;  Laterality: N/A;  ? ESOPHAGOGASTRODUODENOSCOPY (EGD) WITH PROPOFOL N/A 08/17/2020  ? Procedure: ESOPHAGOGASTRODUODENOSCOPY (EGD) WITH PROPOFOL;  Surgeon: Mauri Pole, MD;  Location: [place] ENDOSCOPY;  Service: Endoscopy;  Laterality: N/A;  ? INSERTION OF MESH Left 05/09/2021  ? Procedure: INSERTION OF MESH;  Surgeon: Ralene Ok, MD;  Location: [place];  Service: General;  Laterality: Left;  ? LEFT HEART CATH AND CORONARY ANGIOGRAPHY N/A 03/04/2020  ? Procedure: LEFT HEART CATH AND CORONARY ANGIOGRAPHY;  Surgeon: Burnell Blanks, MD;  Location: [place] CV LAB;  Service:  Cardiovascular;  Laterality: N/A;  ? RADIAL ARTERY HARVEST Left 03/08/2020  ? Procedure: RADIAL ARTERY HARVEST;  Surgeon: Wonda Olds, MD;  Location: [place];  Service: Open Heart Surgery;  Laterality: Left;  ? TEE WITHOUT CARDIOVERSION N/A 03/08/2020  ? Procedure: TRANSESOPHAGEAL ECHOCARDIOGRAM (TEE);  Surgeon: Wonda Olds, MD;  Location: [place];  Service: Open Heart Surgery;  Laterality: N/A;  ? XI ROBOTIC ASSISTED INGUINAL HERNIA REPAIR WITH MESH Left 05/09/2021  ? Procedure: XI ROBOTIC ASSISTED LEFT INGUINAL HERNIA REPAIR WITH MESH;  Surgeon: Ralene Ok, MD;  Location: [place];  Service: General;  Laterality: Left;  ? ? ?Current Medications: ?Current Meds  ?Medication Sig  ? acetaminophen (TYLENOL) 500 MG tablet Take 1,000 mg by mouth every 4 (four) hours as needed (pain).  ? allopurinol (ZYLOPRIM) 100 MG tablet TAKE 1 TABLET BY MOUTH EVERY DAY  ? apixaban (ELIQUIS) 5 MG TABS tablet Take 1 tablet (5 mg total) by mouth 2 (two) times daily.  ? ASPIRIN LOW DOSE 81 MG EC tablet Take 81 mg by mouth every evening.  ? atorvastatin (LIPITOR) 80 MG tablet TAKE 1 TABLET BY MOUTH EVERY DAY  ? Continuous Blood Gluc Receiver (FREESTYLE LIBRE 2 READER) DEVI 1 Act by Does not apply route  daily.  ? Continuous Blood Gluc Sensor (FREESTYLE LIBRE 2 SENSOR) MISC 1 Act by Does not apply route daily.  ? ezetimibe (ZETIA) 10 MG tablet Take 1 tablet (10 mg total) by mouth daily.  ? FARXIGA 10 MG TABS tablet TAKE 1 TABLET BY MOUTH EVERY DAY BEFORE BREAKFAST  ? Fe Bisgly-Vit C-Vit B12-FA (GENTLE IRON) 28-60-0.008-0.4 MG CAPS Take 1 tablet by mouth in the morning.  ? Ferric Maltol (ACCRUFER) 30 MG CAPS Take 1 capsule by mouth in the morning and at bedtime.  ? icosapent Ethyl (VASCEPA) 1 g capsule Take 2 capsules (2 g total) by mouth 2 (two) times daily.  ? isosorbide mononitrate (IMDUR) 30 MG 24 hr tablet Take 1 tablet (30 mg total) by mouth daily.  ? losartan (COZAAR) 50 MG tablet Take 1 tablet (50 mg total) by mouth daily.  ?  Magnesium 300 MG CAPS Take 300 mg by mouth daily.  ? metFORMIN (GLUCOPHAGE) 1000 MG tablet TAKE 1 TABLET (1,000 MG TOTAL) BY MOUTH TWICE A DAY WITH FOOD  ? metoprolol succinate (TOPROL XL) 25 MG 24 hr tablet Take 1 tablet

## 2021-05-29 ENCOUNTER — Inpatient Hospital Stay: Payer: Managed Care, Other (non HMO) | Attending: Family

## 2021-05-29 VITALS — BP 120/77 | HR 85 | Temp 98.2°F | Resp 17

## 2021-05-29 DIAGNOSIS — K922 Gastrointestinal hemorrhage, unspecified: Secondary | ICD-10-CM | POA: Insufficient documentation

## 2021-05-29 DIAGNOSIS — D500 Iron deficiency anemia secondary to blood loss (chronic): Secondary | ICD-10-CM | POA: Diagnosis present

## 2021-05-29 MED ORDER — SODIUM CHLORIDE 0.9 % IV SOLN: Freq: Once | INTRAVENOUS | Status: AC

## 2021-05-29 MED ORDER — SODIUM CHLORIDE 0.9 % IV SOLN
125.0000 mg | Freq: Once | INTRAVENOUS | Status: AC
  Administered 2021-05-29: 125 mg via INTRAVENOUS
  Filled 2021-05-29: qty 125

## 2021-05-29 NOTE — Patient Instructions (Signed)
Sodium Ferric Gluconate Complex Injection ?What is this medication? ?SODIUM FERRIC GLUCONATE COMPLEX (SOE dee um FER ik GLOO koe nate KOM pleks) treats low levels of iron (iron deficiency anemia) in people with kidney disease. Iron is a mineral that plays an important role in making red blood cells, which carry oxygen from your lungs to the rest of your body. ?This medicine may be used for other purposes; ask your health care provider or pharmacist if you have questions. ?COMMON BRAND NAME(S): Ferrlecit, Nulecit ?What should I tell my care team before I take this medication? ?They need to know if you have any of the following conditions: ?Anemia that is not from iron deficiency ?High levels of iron in the blood ?An unusual or allergic reaction to iron, other medications, foods, dyes, or preservatives ?Pregnant or are trying to become pregnant ?Breast-feeding ?How should I use this medication? ?This medication is injected into a vein. It is given by your care team in a hospital or clinic setting. ?Talk to your care team about the use of this medication in children. While it may be prescribed for children as young as 6 years for selected conditions, precautions do apply. ?Overdosage: If you think you have taken too much of this medicine contact a poison control center or emergency room at once. ?NOTE: This medicine is only for you. Do not share this medicine with others. ?What if I miss a dose? ?It is important not to miss your dose. Call your care team if you are unable to keep an appointment. ?What may interact with this medication? ?Do not take this medication with any of the following: ?Deferasirox ?Deferoxamine ?Dimercaprol ?This medication may also interact with the following: ?Other iron products ?This list may not describe all possible interactions. Give your health care provider a list of all the medicines, herbs, non-prescription drugs, or dietary supplements you use. Also tell them if you smoke, drink  alcohol, or use illegal drugs. Some items may interact with your medicine. ?What should I watch for while using this medication? ?Your condition will be monitored carefully while you are receiving this medication. ?Visit your care team for regular checks on your progress. You may need blood work while you are taking this medication. ?What side effects may I notice from receiving this medication? ?Side effects that you should report to your care team as soon as possible: ?Allergic reactions--skin rash, itching, hives, swelling of the face, lips, tongue, or throat ?Low blood pressure--dizziness, feeling faint or lightheaded, blurry vision ?Shortness of breath ?Side effects that usually do not require medical attention (report to your care team if they continue or are bothersome): ?Flushing ?Headache ?Joint pain ?Muscle pain ?Nausea ?Pain, redness, or irritation at injection site ?This list may not describe all possible side effects. Call your doctor for medical advice about side effects. You may report side effects to FDA at 1-800-FDA-1088. ?Where should I keep my medication? ?This medication is given in a hospital or clinic and will not be stored at home. ?NOTE: This sheet is a summary. It may not cover all possible information. If you have questions about this medicine, talk to your doctor, pharmacist, or health care provider. ?? 2022 Elsevier/Gold Standard (2020-07-01 00:00:00) ? ?

## 2021-05-29 NOTE — Progress Notes (Signed)
Pt declined to stay for post infusion observation period. Pt stated he has tolerated medication multiple times prior without difficulty. Pt aware to call clinic with any questions or concerns. Pt verbalized understanding and had no further questions.   

## 2021-06-05 ENCOUNTER — Inpatient Hospital Stay: Payer: Managed Care, Other (non HMO)

## 2021-06-05 VITALS — BP 124/75 | HR 70 | Temp 98.2°F | Resp 17

## 2021-06-05 DIAGNOSIS — D500 Iron deficiency anemia secondary to blood loss (chronic): Secondary | ICD-10-CM

## 2021-06-05 MED ORDER — SODIUM CHLORIDE 0.9% FLUSH: 3.0000 mL | Freq: Once | INTRAVENOUS | Status: DC | PRN

## 2021-06-05 MED ORDER — SODIUM CHLORIDE 0.9% FLUSH: 10.0000 mL | Freq: Once | INTRAVENOUS | Status: DC | PRN

## 2021-06-05 MED ORDER — SODIUM CHLORIDE 0.9 % IV SOLN: Freq: Once | INTRAVENOUS | Status: AC

## 2021-06-05 MED ORDER — SODIUM CHLORIDE 0.9 % IV SOLN
125.0000 mg | Freq: Once | INTRAVENOUS | Status: AC
  Administered 2021-06-05: 125 mg via INTRAVENOUS
  Filled 2021-06-05: qty 125

## 2021-06-05 NOTE — Patient Instructions (Signed)
Sodium Ferric Gluconate Complex Injection What is this medication? SODIUM FERRIC GLUCONATE COMPLEX (SOE dee um FER ik GLOO koe nate KOM pleks) treats low levels of iron (iron deficiency anemia) in people with kidney disease. Iron is a mineral that plays an important role in making red blood cells, which carry oxygen from your lungs to the rest of your body. This medicine may be used for other purposes; ask your health care provider or pharmacist if you have questions. COMMON BRAND NAME(S): Ferrlecit, Nulecit What should I tell my care team before I take this medication? They need to know if you have any of the following conditions: Anemia that is not from iron deficiency High levels of iron in the blood An unusual or allergic reaction to iron, other medications, foods, dyes, or preservatives Pregnant or are trying to become pregnant Breast-feeding How should I use this medication? This medication is injected into a vein. It is given by your care team in a hospital or clinic setting. Talk to your care team about the use of this medication in children. While it may be prescribed for children as young as 6 years for selected conditions, precautions do apply. Overdosage: If you think you have taken too much of this medicine contact a poison control center or emergency room at once. NOTE: This medicine is only for you. Do not share this medicine with others. What if I miss a dose? It is important not to miss your dose. Call your care team if you are unable to keep an appointment. What may interact with this medication? Do not take this medication with any of the following: Deferasirox Deferoxamine Dimercaprol This medication may also interact with the following: Other iron products This list may not describe all possible interactions. Give your health care provider a list of all the medicines, herbs, non-prescription drugs, or dietary supplements you use. Also tell them if you smoke, drink  alcohol, or use illegal drugs. Some items may interact with your medicine. What should I watch for while using this medication? Your condition will be monitored carefully while you are receiving this medication. Visit your care team for regular checks on your progress. You may need blood work while you are taking this medication. What side effects may I notice from receiving this medication? Side effects that you should report to your care team as soon as possible: Allergic reactions--skin rash, itching, hives, swelling of the face, lips, tongue, or throat Low blood pressure--dizziness, feeling faint or lightheaded, blurry vision Shortness of breath Side effects that usually do not require medical attention (report to your care team if they continue or are bothersome): Flushing Headache Joint pain Muscle pain Nausea Pain, redness, or irritation at injection site This list may not describe all possible side effects. Call your doctor for medical advice about side effects. You may report side effects to FDA at 1-800-FDA-1088. Where should I keep my medication? This medication is given in a hospital or clinic and will not be stored at home. NOTE: This sheet is a summary. It may not cover all possible information. If you have questions about this medicine, talk to your doctor, pharmacist, or health care provider.  2023 Elsevier/Gold Standard (2020-07-01 00:00:00)  

## 2021-07-13 ENCOUNTER — Other Ambulatory Visit: Payer: Self-pay | Admitting: Internal Medicine

## 2021-07-13 DIAGNOSIS — I25810 Atherosclerosis of coronary artery bypass graft(s) without angina pectoris: Secondary | ICD-10-CM

## 2021-07-13 DIAGNOSIS — E1159 Type 2 diabetes mellitus with other circulatory complications: Secondary | ICD-10-CM

## 2021-08-01 ENCOUNTER — Other Ambulatory Visit: Payer: Self-pay

## 2021-08-01 DIAGNOSIS — E782 Mixed hyperlipidemia: Secondary | ICD-10-CM

## 2021-08-01 DIAGNOSIS — I2510 Atherosclerotic heart disease of native coronary artery without angina pectoris: Secondary | ICD-10-CM

## 2021-08-01 DIAGNOSIS — Z951 Presence of aortocoronary bypass graft: Secondary | ICD-10-CM

## 2021-08-01 DIAGNOSIS — Z79899 Other long term (current) drug therapy: Secondary | ICD-10-CM

## 2021-08-01 MED ORDER — EZETIMIBE 10 MG PO TABS: 10.0000 mg | ORAL_TABLET | Freq: Every day | ORAL | 3 refills | Status: DC

## 2021-08-04 ENCOUNTER — Other Ambulatory Visit: Payer: Self-pay | Admitting: Internal Medicine

## 2021-08-04 DIAGNOSIS — M1A09X0 Idiopathic chronic gout, multiple sites, without tophus (tophi): Secondary | ICD-10-CM

## 2021-08-04 DIAGNOSIS — E785 Hyperlipidemia, unspecified: Secondary | ICD-10-CM

## 2021-08-04 DIAGNOSIS — I2510 Atherosclerotic heart disease of native coronary artery without angina pectoris: Secondary | ICD-10-CM

## 2021-08-31 ENCOUNTER — Telehealth: Payer: Self-pay | Admitting: *Deleted

## 2021-08-31 NOTE — Telephone Encounter (Signed)
Pt was on cover-my-meds need PA on Accrufer 30 mg. Submitted PA w/ Key: [id number]). Rec'd msg Your PA request has been sent to [employer],,,/lmb

## 2021-09-01 NOTE — Telephone Encounter (Signed)
Rec'd message stating " Your PA request has been closed. Your ePA request has been aborted. No PA required. Approval already on file." Faxed info to [email or web address]

## 2021-09-08 ENCOUNTER — Other Ambulatory Visit: Payer: Self-pay | Admitting: Internal Medicine

## 2021-09-08 DIAGNOSIS — E119 Type 2 diabetes mellitus without complications: Secondary | ICD-10-CM

## 2021-09-28 ENCOUNTER — Telehealth: Payer: Self-pay | Admitting: Internal Medicine

## 2021-09-28 NOTE — Telephone Encounter (Signed)
Patient is in [facility] in [facility] - Patient had severe heart attack and is in critical condition - Patient's wife would like you to call her.  Patient needs to be transferred to [place].

## 2021-09-28 NOTE — Telephone Encounter (Signed)
LVM to pt wife to call in regards to Dr. Ronnald Ramp note for the transfer

## 2021-10-02 NOTE — Telephone Encounter (Signed)
Seems like encounter was open in error so closing encounter.  

## 2021-10-10 ENCOUNTER — Other Ambulatory Visit: Payer: Self-pay | Admitting: Internal Medicine

## 2021-10-10 DIAGNOSIS — E1159 Type 2 diabetes mellitus with other circulatory complications: Secondary | ICD-10-CM

## 2021-10-10 DIAGNOSIS — I25810 Atherosclerosis of coronary artery bypass graft(s) without angina pectoris: Secondary | ICD-10-CM

## 2021-10-20 ENCOUNTER — Telehealth: Payer: Self-pay | Admitting: Cardiology

## 2021-10-20 NOTE — Telephone Encounter (Signed)
Will forward this information to Dr. Johney Frame as an Juluis Rainier.

## 2021-10-20 NOTE — Telephone Encounter (Signed)
Wife calling want to talk to the Dr because the patient is in ICU. Please advise

## 2021-10-26 NOTE — Telephone Encounter (Signed)
[place], Irwin Brilliant. - 10/20/2021  1:19 PM Freada Bergeron, MD  Sent: Thu October 26, 2021  1:06 PM  To: Nuala Alpha, LPN          Message  Called and spoke to the patient and his wife. He is making slow recovery.

## 2021-10-30 ENCOUNTER — Other Ambulatory Visit: Payer: Self-pay | Admitting: Pharmacist

## 2021-10-30 MED ORDER — ICOSAPENT ETHYL 1 G PO CAPS: 2.0000 g | ORAL_CAPSULE | Freq: Two times a day (BID) | ORAL | 3 refills | Status: DC

## 2021-11-05 LAB — CBC AND DIFFERENTIAL
HCT: 29 — AB (ref 41–53)
Hemoglobin: 10 — AB (ref 13.5–17.5)
Platelets: 171 10*3/uL (ref 150–400)
WBC: 7.4

## 2021-11-06 LAB — BASIC METABOLIC PANEL
BUN: 51 — AB (ref 4–21)
CO2: 26 — AB (ref 13–22)
Chloride: 102 (ref 99–108)
Creatinine: 3.2 — AB (ref 0.6–1.3)
Glucose: 102
Potassium: 3.7 mEq/L (ref 3.5–5.1)
Sodium: 137 (ref 137–147)

## 2021-11-07 ENCOUNTER — Telehealth: Payer: Self-pay | Admitting: Internal Medicine

## 2021-11-07 ENCOUNTER — Telehealth: Payer: Self-pay

## 2021-11-07 NOTE — Telephone Encounter (Signed)
PCS form received today for [facility]  Pt has been scheduled for a hosp f/u for 9/26 @ 2.20pm  Will hold forms until them.

## 2021-11-07 NOTE — Telephone Encounter (Signed)
Patients wife dropped by and dropped off some forms and I put that in her own telephone note. She said he was seen in the hospital and he was told not to take metformin. She said the only thing he has at home is rybelsus and she wasn't sure if that's good enough. She would like a call back at [phone]

## 2021-11-08 ENCOUNTER — Other Ambulatory Visit: Payer: Self-pay | Admitting: Internal Medicine

## 2021-11-08 ENCOUNTER — Telehealth: Payer: Self-pay | Admitting: Internal Medicine

## 2021-11-08 ENCOUNTER — Telehealth: Payer: Self-pay

## 2021-11-08 DIAGNOSIS — I480 Paroxysmal atrial fibrillation: Secondary | ICD-10-CM

## 2021-11-08 DIAGNOSIS — M159 Polyosteoarthritis, unspecified: Secondary | ICD-10-CM

## 2021-11-08 DIAGNOSIS — R000 Tachycardia, unspecified: Secondary | ICD-10-CM

## 2021-11-08 DIAGNOSIS — N184 Chronic kidney disease, stage 4 (severe): Secondary | ICD-10-CM

## 2021-11-08 DIAGNOSIS — I25810 Atherosclerosis of coronary artery bypass graft(s) without angina pectoris: Secondary | ICD-10-CM

## 2021-11-08 NOTE — Telephone Encounter (Signed)
Patient needs his tramadol refilled - please send to [facility] - Patient also needs his vitamin D2 - 50,000 units refilled

## 2021-11-08 NOTE — Telephone Encounter (Signed)
LVM for Heather to proceed with nursing orders with frequency of once every other week for 3 visits as requested.  FYI: Informed Heather that pt has an upcoming OV next week for hospital follow up.

## 2021-11-08 NOTE — Telephone Encounter (Signed)
Heather from [facility] called in asking if Dr.Jones would sign off Nursing orders, with frequency once every other week for 3 visits in total. Nira Conn states that the wife Dr.Jones a prescription [place] P[employer] to [facility] and a referral to have Theatre stage manager [facility]).

## 2021-11-08 NOTE — Telephone Encounter (Signed)
Transition Care Management Follow-up Telephone Call Date of discharge and from where:  [facility] 11/06/21  How have you been since you were released from the hospital?  Any questions or concerns? No  Items Reviewed: Did the pt receive and understand the discharge instructions provided? Yes  Medications obtained and verified? Yes  Other?  N/a Any new allergies since your discharge? No  Dietary orders reviewed? No Do you have support at home? Yes   Home Care and Equipment/Supplies: Were home health services ordered? Yes If so, what is the name of the agency? [facility] Has the agency set up a time to come to the patient's home?  Were any new equipment or medical supplies ordered? Yes What is the name of the medical supply agency? [facility] Were you able to get the supplies/equipment? Yes Do you have any questions related to the use of the equipment or supplies? No  Functional Questionnaire: (I = Independent and D = Dependent) ADLs: D  Bathing/Dressing- D  Meal Prep- D  Eating- I  Maintaining continence- I  Transferring/Ambulation- D  Managing Meds- D  Follow up appointments reviewed:  PCP Hospital f/u appt confirmed? Yes  Scheduled to see PCP on 9/26 @ 2.20pm. [facility] f/u appt confirmed? Yes   Are transportation arrangements needed? No  If their condition worsens, is the pt aware to call PCP or go to the Emergency Dept.? Yes Was the patient provided with contact information for the PCP's office or ED? Yes Was to pt encouraged to call back with questions or concerns? Yes

## 2021-11-09 ENCOUNTER — Other Ambulatory Visit: Payer: Self-pay

## 2021-11-09 ENCOUNTER — Telehealth: Payer: Self-pay

## 2021-11-09 MED ORDER — ISOSORBIDE MONONITRATE ER 30 MG PO TB24: 30.0000 mg | ORAL_TABLET | Freq: Every day | ORAL | 2 refills | Status: DC

## 2021-11-09 NOTE — Telephone Encounter (Signed)
Verbal orders given to [facility] for PT for 1W1 / 2W3 / 1W1

## 2021-11-09 NOTE — Telephone Encounter (Signed)
Home Health verbal orders  Agency Name: [facility]   Requesting PT  Frequency: 4Y1 / 2W3 / 1W1  Please forward to [facility] pool or providers CMA

## 2021-11-14 ENCOUNTER — Encounter: Payer: Self-pay | Admitting: Internal Medicine

## 2021-11-14 ENCOUNTER — Ambulatory Visit (INDEPENDENT_AMBULATORY_CARE_PROVIDER_SITE_OTHER): Payer: Managed Care, Other (non HMO) | Admitting: Internal Medicine

## 2021-11-14 ENCOUNTER — Encounter: Payer: Self-pay | Admitting: Family

## 2021-11-14 ENCOUNTER — Inpatient Hospital Stay: Payer: Managed Care, Other (non HMO) | Attending: Hematology & Oncology

## 2021-11-14 ENCOUNTER — Inpatient Hospital Stay: Payer: Managed Care, Other (non HMO) | Admitting: Family

## 2021-11-14 VITALS — BP 136/82 | HR 66 | Temp 98.3°F | Resp 16 | Ht 68.0 in

## 2021-11-14 DIAGNOSIS — Z794 Long term (current) use of insulin: Secondary | ICD-10-CM | POA: Diagnosis not present

## 2021-11-14 DIAGNOSIS — M159 Polyosteoarthritis, unspecified: Secondary | ICD-10-CM | POA: Diagnosis not present

## 2021-11-14 DIAGNOSIS — Z23 Encounter for immunization: Secondary | ICD-10-CM

## 2021-11-14 DIAGNOSIS — E119 Type 2 diabetes mellitus without complications: Secondary | ICD-10-CM | POA: Diagnosis not present

## 2021-11-14 DIAGNOSIS — N184 Chronic kidney disease, stage 4 (severe): Secondary | ICD-10-CM | POA: Diagnosis not present

## 2021-11-14 DIAGNOSIS — M150 Primary generalized (osteo)arthritis: Secondary | ICD-10-CM

## 2021-11-14 LAB — HEMOGLOBIN A1C: Hgb A1c MFr Bld: 5.3 % (ref 4.6–6.5)

## 2021-11-14 MED ORDER — TRAMADOL HCL 50 MG PO TABS: 50.0000 mg | ORAL_TABLET | Freq: Two times a day (BID) | ORAL | 0 refills | Status: DC | PRN

## 2021-11-14 NOTE — Patient Instructions (Signed)
                                                                    [email or web address]                       

## 2021-11-14 NOTE — Telephone Encounter (Signed)
PCS forms have been completed and faxed back to [facility]

## 2021-11-14 NOTE — Progress Notes (Unsigned)
Subjective:  Patient ID: Colton Dell., male    DOB: Jun 29, 1960  Age: 61 y.o. MRN: [id number]  CC: Anemia, Hypertension, and Diabetes   HPI [employer] presents for f/up -  He was admitted to [facility] about 6 weeks ago with cardiogenic shock.  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  He has an appointment with nephrology tomorrow.  Outpatient Medications Prior to Visit  Medication Sig Dispense Refill   acetaminophen (TYLENOL) 500 MG tablet Take 1,000 mg by mouth every 4 (four) hours as needed (pain).     allopurinol (ZYLOPRIM) 100 MG tablet TAKE 1 TABLET BY MOUTH EVERY DAY 90 tablet 0   apixaban (ELIQUIS) 5 MG TABS tablet Take 1 tablet (5 mg total) by mouth 2 (two) times daily. 60 tablet 6   ASPIRIN LOW DOSE 81 MG EC tablet Take 81 mg by mouth every evening.     atorvastatin (LIPITOR) 80 MG tablet TAKE 1 TABLET BY MOUTH EVERY DAY 90 tablet 0   Continuous Blood Gluc Receiver (FREESTYLE LIBRE 2 READER) DEVI 1 Act by Does not apply route daily. 2 each 5   Continuous Blood Gluc Sensor (FREESTYLE LIBRE 2 SENSOR) MISC 1 ACT BY DOES NOT APPLY ROUTE DAILY. 2 each 5   ezetimibe (ZETIA) 10 MG tablet Take 1 tablet (10 mg total) by mouth daily. 90 tablet 3   Fe Bisgly-Vit C-Vit B12-FA (GENTLE IRON) 28-60-0.008-0.4 MG CAPS Take 1 tablet by mouth in the morning.     Ferric Maltol (ACCRUFER) 30 MG CAPS Take 1 capsule by mouth in the morning and at bedtime. 180 capsule 1   icosapent Ethyl (VASCEPA) 1 g capsule Take 2 capsules (2 g total) by mouth 2 (two) times daily. 360 capsule 3   Insulin Pen Needle 32G X 6 MM MISC 1 Act by Does not apply route daily. 100 each 1   isosorbide mononitrate (IMDUR) 30 MG 24 hr tablet Take 1 tablet (30 mg total) by mouth daily. 90 tablet 2   losartan (COZAAR) 50 MG tablet Take 1 tablet (50 mg total) by mouth daily. 90  tablet 3   Magnesium 300 MG CAPS Take 300 mg by mouth daily.     metoprolol succinate (TOPROL XL) 25 MG 24 hr tablet Take 1 tablet (25 mg total) by mouth daily. 30 tablet 11   Multiple Vitamin (MULTIVITAMIN WITH MINERALS) TABS tablet Take 1 tablet by mouth daily.     pantoprazole (PROTONIX) 40 MG tablet Please take 40 mg oral twice daily x30 days, then 40 mg oral daily. 90 tablet 0   vitamin B-12 (CYANOCOBALAMIN) 1000 MCG tablet Take 1,000 mcg by mouth in the morning.     Vitamin D, Ergocalciferol, (DRISDOL) 1.25 MG (50000 UNIT) CAPS capsule Take 50,000 Units by mouth every Tuesday.     FARXIGA 10 MG TABS tablet TAKE 1 TABLET BY MOUTH EVERY DAY BEFORE BREAKFAST 90 tablet 0   Glucagon (GVOKE HYPOPEN 2-PACK) 1 MG/0.2ML SOAJ Inject 1 Act into the skin daily as needed. 2 mL 5   insulin degludec (TRESIBA FLEXTOUCH) 200 UNIT/ML FlexTouch Pen Inject 50 Units into the skin daily. 24 mL 1   metFORMIN (GLUCOPHAGE) 1000 MG tablet TAKE 1 TABLET (1,000 MG TOTAL) BY MOUTH TWICE A DAY WITH FOOD 180 tablet 0   RYBELSUS 14 MG TABS TAKE 1 TABLET BY MOUTH EVERY DAY 90 tablet 0  traMADol (ULTRAM) 50 MG tablet Take 1 tablet (50 mg total) by mouth every 6 (six) hours as needed. 20 tablet 0   No facility-administered medications prior to visit.    ROS Review of Systems  Constitutional:  Positive for appetite change, fatigue and unexpected weight change. Negative for diaphoresis.  HENT: Negative.    Eyes: Negative.   Respiratory:  Negative for cough, chest tightness, shortness of breath and wheezing.   Cardiovascular:  Negative for chest pain, palpitations and leg swelling.  Gastrointestinal:  Negative for abdominal pain, constipation, diarrhea, nausea and vomiting.  Endocrine: Negative.   Genitourinary: Negative.  Negative for difficulty urinating.  Musculoskeletal:  Positive for arthralgias and gait problem. Negative for joint swelling.  Skin: Negative.   Neurological:  Positive for weakness. Negative for  light-headedness and numbness.  Hematological:  Negative for adenopathy. Does not bruise/bleed easily.  Psychiatric/Behavioral: Negative.      Objective:  BP 136/82 (BP Location: Left Arm, Patient Position: Sitting, Cuff Size: Large)   Pulse 66   Temp 98.3 F (36.8 C) (Oral)   Resp 16   Ht 5\' 8"  (1.727 m)   SpO2 97%   BMI 26.37 kg/m   BP Readings from Last 3 Encounters:  11/14/21 136/82  06/05/21 124/75  05/29/21 120/77    Wt Readings from Last 3 Encounters:  05/24/21 173 lb 6.4 oz (78.7 kg)  05/16/21 177 lb (80.3 kg)  05/09/21 181 lb (82.1 kg)    Physical Exam Vitals reviewed.  Constitutional:      Appearance: He is ill-appearing (in a wheelchair).  HENT:     Nose: Nose normal.     Mouth/Throat:     Mouth: Mucous membranes are moist.  Eyes:     General: No scleral icterus.    Conjunctiva/sclera: Conjunctivae normal.  Cardiovascular:     Rate and Rhythm: Normal rate and regular rhythm.     Heart sounds: Murmur heard.     Systolic murmur is present with a grade of 1/6.     No friction rub. No gallop.  Pulmonary:     Effort: Pulmonary effort is normal.     Breath sounds: No stridor. No wheezing, rhonchi or rales.  Abdominal:     General: Abdomen is flat.     Palpations: There is no mass.     Tenderness: There is no abdominal tenderness. There is no guarding.     Hernia: No hernia is present.  Musculoskeletal:        General: Normal range of motion.     Cervical back: Neck supple.     Right lower leg: No edema.  Lymphadenopathy:     Cervical: No cervical adenopathy.  Skin:    General: Skin is warm and dry.  Neurological:     Mental Status: He is alert. Mental status is at baseline.     Motor: Weakness present.     Gait: Gait abnormal.  Psychiatric:        Mood and Affect: Mood normal.        Behavior: Behavior normal.     Lab Results  Component Value Date   WBC 7.4 11/05/2021   HGB 10.0 (A) 11/05/2021   HCT 29 (A) 11/05/2021   PLT 171 11/05/2021    GLUCOSE 126 (H) 05/03/2021   CHOL 121 10/25/2020   TRIG 204 (H) 10/25/2020   HDL 28 (L) 10/25/2020   LDLDIRECT 93.0 07/26/2020   LDLCALC 59 10/25/2020   ALT 21 09/06/2020   AST 18  09/06/2020   NA 137 11/06/2021   K 3.7 11/06/2021   CL 102 11/06/2021   CREATININE 3.2 (A) 11/06/2021   BUN 51 (A) 11/06/2021   CO2 26 (A) 11/06/2021   TSH 1.31 03/28/2021   PSA 0.13 03/28/2021   INR 1.2 (H) 05/18/2020   HGBA1C 5.3 11/14/2021   MICROALBUR 1.3 03/28/2021    No results found.  Assessment & Plan:   Bernie was seen today for anemia, hypertension and diabetes.  Diagnoses and all orders for this visit:  Primary osteoarthritis involving multiple joints -     traMADol (ULTRAM) 50 MG tablet; Take 1 tablet (50 mg total) by mouth every 12 (twelve) hours as needed.  Insulin-requiring or dependent type II diabetes mellitus ([place])- His A1c is at 5.3%.  Will discontinue metformin and the GLP-1 agonist. -     Hemoglobin A1c; Future -     Hemoglobin A1c  Chronic renal disease, stage 4, severely decreased glomerular filtration rate (GFR) between 15-29 mL/min/1.73 square meter [facility])- He will see nephrology tomorrow.  Other orders -     Flu Vaccine QUAD 6+ mos PF IM (Fluarix Quad PF)   I have discontinued [employer]s Gvoke HypoPen 2-Pack, Farxiga, Tresiba FlexTouch, metFORMIN, traMADol, and Rybelsus. I am also having him start on traMADol. Additionally, I am having him maintain his Vitamin D (Ergocalciferol), Magnesium, multivitamin with minerals, metoprolol succinate, acetaminophen, pantoprazole, ACCRUFeR, losartan, Aspirin Low Dose, FreeStyle Libre 2 Reader, Insulin Pen Needle, Gentle Iron, cyanocobalamin, apixaban, ezetimibe, allopurinol, atorvastatin, FreeStyle Libre 2 Sensor, icosapent Ethyl, and isosorbide mononitrate.  Meds ordered this encounter  Medications   traMADol (ULTRAM) 50 MG tablet    Sig: Take 1 tablet (50 mg total) by mouth every 12 (twelve) hours as needed.     Dispense:  180 tablet    Refill:  0     Follow-up: Return in about 3 months (around 02/13/2022).  Scarlette Calico, MD

## 2021-11-15 ENCOUNTER — Other Ambulatory Visit: Payer: Self-pay | Admitting: Internal Medicine

## 2021-11-15 DIAGNOSIS — I480 Paroxysmal atrial fibrillation: Secondary | ICD-10-CM

## 2021-11-15 DIAGNOSIS — I25810 Atherosclerosis of coronary artery bypass graft(s) without angina pectoris: Secondary | ICD-10-CM

## 2021-11-17 ENCOUNTER — Telehealth: Payer: Self-pay

## 2021-11-17 NOTE — Telephone Encounter (Signed)
Verbal orders given to proceed with OT for frequency: 1W2 / skip a week / 3R6

## 2021-11-17 NOTE — Telephone Encounter (Signed)
Home Health verbal orders  Agency Name: [facility]  Requesting OT  Frequency: 3O1 / skip a week / 1W1   Please forward to [facility] pool or providers CMA

## 2021-11-20 ENCOUNTER — Other Ambulatory Visit: Payer: Self-pay | Admitting: Internal Medicine

## 2021-11-20 ENCOUNTER — Telehealth: Payer: Self-pay

## 2021-11-20 DIAGNOSIS — I10 Essential (primary) hypertension: Secondary | ICD-10-CM

## 2021-11-20 MED ORDER — HYDRALAZINE HCL 25 MG PO TABS: 25.0000 mg | ORAL_TABLET | Freq: Three times a day (TID) | ORAL | 0 refills | Status: DC

## 2021-11-20 NOTE — Telephone Encounter (Signed)
Noted  

## 2021-11-20 NOTE — Telephone Encounter (Signed)
I advised the pt wife that Dr. Ronnald Ramp prescribed Hydralazine to start in additional to metoprolol succinate (TOPROL XL) 25 MG 24 hr tablet isosorbide mononitrate (IMDUR) 30 MG 24 hr tablet".  FYI

## 2021-11-20 NOTE — Telephone Encounter (Signed)
LVM for Chirstina, pt's wife, to discuss.   Pt should start Hydralazine in addition to:  metoprolol succinate (TOPROL XL) 25 MG 24 hr tablet isosorbide mononitrate (IMDUR) 30 MG 24 hr tablet"

## 2021-11-20 NOTE — Telephone Encounter (Signed)
Pt wife is calling on Behalf of pt to report that the BP readings have been high.  Resting BP readings have been: 11/15/21 150/97 11/16/21 152/94 11/17/21 155/94 11/18/21 156/93 11/19/21 162/104 11/20/21 154/96  Pt is currently taking: metoprolol succinate (TOPROL XL) 25 MG 24 hr tablet isosorbide mononitrate (IMDUR) 30 MG 24 hr tablet  Pt wife is requesting another medication because the BP is not coming down. Pt wife is very concerned as the pt has just had a multiple strokes last month.  Pharmacy: [id number] - [place], [place]  Pt wife is asking if the pt should be taking all of these medications for Antiplatelets: apixaban 5 mg tablet  aspirin 81 MG EC tablet  ticagrelor 90 mg Tab tablet   Please advise

## 2021-12-05 ENCOUNTER — Other Ambulatory Visit: Payer: Self-pay | Admitting: Internal Medicine

## 2021-12-05 ENCOUNTER — Telehealth: Payer: Self-pay | Admitting: Internal Medicine

## 2021-12-05 ENCOUNTER — Other Ambulatory Visit: Payer: Self-pay | Admitting: Physician Assistant

## 2021-12-05 DIAGNOSIS — I2510 Atherosclerotic heart disease of native coronary artery without angina pectoris: Secondary | ICD-10-CM

## 2021-12-05 DIAGNOSIS — M1A09X0 Idiopathic chronic gout, multiple sites, without tophus (tophi): Secondary | ICD-10-CM

## 2021-12-05 DIAGNOSIS — E785 Hyperlipidemia, unspecified: Secondary | ICD-10-CM

## 2021-12-05 NOTE — Telephone Encounter (Signed)
[facility] called and is advising that patient continue physical therapy.  Please advise.

## 2021-12-14 ENCOUNTER — Encounter: Payer: Self-pay | Admitting: Cardiology

## 2021-12-14 ENCOUNTER — Other Ambulatory Visit: Payer: Self-pay | Admitting: Internal Medicine

## 2021-12-14 DIAGNOSIS — I2510 Atherosclerotic heart disease of native coronary artery without angina pectoris: Secondary | ICD-10-CM

## 2021-12-14 DIAGNOSIS — Z79899 Other long term (current) drug therapy: Secondary | ICD-10-CM

## 2021-12-14 DIAGNOSIS — E782 Mixed hyperlipidemia: Secondary | ICD-10-CM

## 2021-12-14 DIAGNOSIS — Z951 Presence of aortocoronary bypass graft: Secondary | ICD-10-CM

## 2021-12-14 DIAGNOSIS — M1A09X0 Idiopathic chronic gout, multiple sites, without tophus (tophi): Secondary | ICD-10-CM

## 2021-12-14 DIAGNOSIS — I10 Essential (primary) hypertension: Secondary | ICD-10-CM

## 2021-12-14 DIAGNOSIS — E785 Hyperlipidemia, unspecified: Secondary | ICD-10-CM

## 2021-12-14 MED ORDER — AMIODARONE HCL 200 MG PO TABS: 200.0000 mg | ORAL_TABLET | Freq: Every day | ORAL | 2 refills | Status: DC

## 2021-12-14 MED ORDER — CLOPIDOGREL BISULFATE 75 MG PO TABS: ORAL_TABLET | ORAL | 0 refills | Status: DC

## 2021-12-14 MED ORDER — LOSARTAN POTASSIUM 50 MG PO TABS: 50.0000 mg | ORAL_TABLET | Freq: Every day | ORAL | 3 refills | Status: DC

## 2021-12-14 MED ORDER — APIXABAN 5 MG PO TABS
5.0000 mg | ORAL_TABLET | Freq: Two times a day (BID) | ORAL | 6 refills | Status: DC
  Filled 2022-07-20 – 2022-08-13 (×2): qty 60, 30d supply, fill #0

## 2021-12-14 MED ORDER — METOPROLOL SUCCINATE ER 25 MG PO TB24: 25.0000 mg | ORAL_TABLET | Freq: Every day | ORAL | 2 refills | Status: DC

## 2021-12-14 MED ORDER — EZETIMIBE 10 MG PO TABS
10.0000 mg | ORAL_TABLET | Freq: Every day | ORAL | 3 refills | Status: DC
  Filled 2022-07-20: qty 90, 90d supply, fill #0
  Filled 2022-09-25: qty 30, 30d supply, fill #0
  Filled 2022-10-31: qty 30, 30d supply, fill #1
  Filled 2022-11-30 (×2): qty 30, 30d supply, fill #2

## 2021-12-14 MED ORDER — ISOSORBIDE MONONITRATE ER 30 MG PO TB24
30.0000 mg | ORAL_TABLET | Freq: Every day | ORAL | 2 refills | Status: DC
  Filled 2022-09-25: qty 30, 30d supply, fill #0
  Filled 2022-10-31: qty 30, 30d supply, fill #1

## 2021-12-14 NOTE — Telephone Encounter (Signed)
Dr. Johney Frame called me on the phone about this pts medications and what to refill and what new start of Plavix we will need to send in for the pt.   Per Dr. Johney Frame, we will refill his Toprol XL 25 mg daily, amiodarone 200 mg daily, atorvastatin 80 mg daily, along with his imdur 30 mg daily, and losartan 50 mg daily.   Did also send in refills of his zetia, eliquis, and imdur, for he is running low on those meds as well.    Per Dr. Johney Frame, the pt will no longer take Brilinta, due to recent hospital stay with bleeding complications, and being he is on eliquis and low dose ASA as well.  Plavix is the safer option for him, and we will load him with Plavix 300 mg po tonight only, then decrease him to Plavix 75 mg po daily thereafter.   Per Dr. Johney Frame, verbal order given to send in the pt Plavix, with a loading dose of 300 mg po tonight only, then decrease to taking 75 mg po daily thereafter.    Pt aware that refills sent in, and that he will no longer take Brilinta, and will start Plavix 300 mg tonight, then decrease to taking 75 mg po daily thereafter.    He is aware [facility].   Advised him to keep his follow-up appt with [place] as scheduled for next Monday 10/30 at 4:20 pm.  Pt verbalized understanding and agrees with this plan.  Pt was more than gracious for all the assistance provided. He states he will go and pick his prescriptions up tonight and start taking Plavix as directed.

## 2021-12-14 NOTE — Progress Notes (Unsigned)
Cardiology Office Note:    Date:  12/14/2021   ID:  Colton Neptune Ahmaad Neidhardt., DOB June 05, 1960, MRN [id number]  PCP:  [name], MD   [place]  Cardiologist:  Freada Bergeron, MD  Advanced Practice Provider:  No care team member to display Electrophysiologist:  Vickie Epley, MD    Referring MD: [name], MD    History of Present Illness:    Colton Guzman. is a 61 y.o. male with a hx of DMII, HLD and HTN and recent prolonged admission at [facility] hospital where he presented with hematochezia with course complicated by chest pain/VT arrest requiring CPR later found to have multivessel CAD s/p CABG on 03/08/20 who now presents to clinic for follow-up.   Patient was hospitalized at [facility] from 02/22/20-03/13/20 where he presented with hematochezia from diverticular source. On 02/23/20, the patient developed chest pain in the setting of a blood bowel movement that initially responded to SL NTG but then recurred with subsequent VT arrest requiring 2-32min CPR and shock. Post-shock rhythm Afib. Trop peaked at [id number]. He was stabilized from GIB standpoint and underwent coronary angiography on 03/04/20 which showed 70% stenosis of the proximal RCA with 50% stenosis of the distal RCA, 70% stenosis of the right PDA, 99% stenosis of the third right PL, 99% stenosis of the second right PL, 90% stenosis of the mid LAD with an ostial LAD stenosis of 70%, 100% stenosis of the ostial to proximal circumflex, and 60% stenosis of the mid LM to ostial LAD.  Due to his severe proximal LAD stenosis and severe mid LAD stenosis coronary bypass grafting was recommended. He underwent successful CABG x5 on 03/08/20 with LIMA to distal LAD, SVG to PDA., SVG tp posterior lateral branch of the right coronary artery, left radial artery to the ramus intermediate and first obtuse marginal coronary arteries.  Following the procedure, he separated from cardiopulmonary bypass without  difficulty. He did well and was successfully discharged home on 03/13/20.  Seen by me on 03/28/20 where he was doing well. Obtained cardiac monitor which showed <1% burden of Afib, 3% burden of PVCs/NSVT. Given Afib outside of the post-operative setting, his plavix was stopped and he was placed on apixaban for [facility].   Was hospitalized at [facility] from 08/12/20-08/18/20 with GIB. During admission, he required 6units pRBCs total. His ASA and apixaban were initially held. Colonoscopy revealed severe diverticulosis but no active bleeding. EGD with nonbleeding gastric ulcers. Tagged RBC scan was also negative. Patient overall improved and H/H remained stable with conservative management with PPI. He was resumed on his apixaban which he tolerated and was discharged home.  He was evaluated by Dr. Quentin Ore for consideration of Watchman however, prewatchman implant CT scan which showed very difficult anatomy with a very shallow landing zone for the watchman device. After three-dimensional reconstruction of the appendage, he was deemed a poor Watchman candidate. May be considered for amulet in the future.   Presented to [facility] in 09/2021 after witnessed cardiac arrest. Initial rhythm was reportedly Vfib. Had 1 round of CPR with 1 shock with successful ROSC. Was taken for cath on 09/27/21 where he underwent DES to D1. Bedside ultrasound demonstrated severely reduced EF 10% and he was subsequently taken from impella placement. Course was further complicated by cardiogenic shock, renal failure requiring HD, embolic stroke, and anemia from blood loss at impella site requiring multiple transfusions. Notably, he was maintained on apixaban, ASA and ticagrelor.  Today, ***  Past Medical History:  Diagnosis Date   Anemia    hx iron transfusion   Arthritis    left shoulder   CAD (coronary artery disease) of artery bypass graft 02/2020   Diabetes mellitus without complication (HCC)    type 2   Elevated blood uric  acid level    GI bleed    History of blood transfusion 08/19/2020   Hyperlipidemia LDL goal <70    Hypertension    PAF (paroxysmal atrial fibrillation) ([place])    Wears partial dentures    upper dent/lower partial    Past Surgical History:  Procedure Laterality Date   BIOPSY  08/17/2020   Procedure: BIOPSY;  Surgeon: Mauri Pole, MD;  Location: [place] ENDOSCOPY;  Service: Endoscopy;;   COLONOSCOPY WITH PROPOFOL Left 02/27/2020   Procedure: COLONOSCOPY WITH PROPOFOL;  Surgeon: Otis Brace, MD;  Location: [facility];  Service: Gastroenterology;  Laterality: Left;   COLONOSCOPY WITH PROPOFOL N/A 08/13/2020   Procedure: COLONOSCOPY WITH PROPOFOL;  Surgeon: Gatha Mayer, MD;  Location: [facility] ENDOSCOPY;  Service: Endoscopy;  Laterality: N/A;   CORONARY ARTERY BYPASS GRAFT N/A 03/08/2020   Procedure: CORONARY ARTERY BYPASS GRAFTING (CABG) x 5 ON PUMP;  Surgeon: Wonda Olds, MD;  Location: [facility] OR;  Service: Open Heart Surgery;  Laterality: N/A;   ENDOVEIN HARVEST OF GREATER SAPHENOUS VEIN Right 03/08/2020   Procedure: ENDOVEIN HARVEST OF GREATER SAPHENOUS VEIN;  Surgeon: Wonda Olds, MD;  Location: [place];  Service: Open Heart Surgery;  Laterality: Right;   ESOPHAGOGASTRODUODENOSCOPY (EGD) WITH PROPOFOL N/A 02/27/2020   Procedure: ESOPHAGOGASTRODUODENOSCOPY (EGD) WITH PROPOFOL;  Surgeon: Otis Brace, MD;  Location: [facility];  Service: Gastroenterology;  Laterality: N/A;   ESOPHAGOGASTRODUODENOSCOPY (EGD) WITH PROPOFOL N/A 08/17/2020   Procedure: ESOPHAGOGASTRODUODENOSCOPY (EGD) WITH PROPOFOL;  Surgeon: Mauri Pole, MD;  Location: [place] ENDOSCOPY;  Service: Endoscopy;  Laterality: N/A;   INSERTION OF MESH Left 05/09/2021   Procedure: INSERTION OF MESH;  Surgeon: Ralene Ok, MD;  Location: [place];  Service: General;  Laterality: Left;   LEFT HEART CATH AND CORONARY ANGIOGRAPHY N/A 03/04/2020   Procedure: LEFT HEART CATH AND CORONARY ANGIOGRAPHY;  Surgeon: Burnell Blanks, MD;  Location: [place] CV LAB;  Service: Cardiovascular;  Laterality: N/A;   RADIAL ARTERY HARVEST Left 03/08/2020   Procedure: RADIAL ARTERY HARVEST;  Surgeon: Wonda Olds, MD;  Location: [place];  Service: Open Heart Surgery;  Laterality: Left;   TEE WITHOUT CARDIOVERSION N/A 03/08/2020   Procedure: TRANSESOPHAGEAL ECHOCARDIOGRAM (TEE);  Surgeon: Wonda Olds, MD;  Location: [place];  Service: Open Heart Surgery;  Laterality: N/A;   XI ROBOTIC ASSISTED INGUINAL HERNIA REPAIR WITH MESH Left 05/09/2021   Procedure: XI ROBOTIC ASSISTED LEFT INGUINAL HERNIA REPAIR WITH MESH;  Surgeon: Ralene Ok, MD;  Location: [place];  Service: General;  Laterality: Left;    Current Medications: No outpatient medications have been marked as taking for the 12/18/21 encounter (Appointment) with Freada Bergeron, MD.     Allergies:   Patient has no known allergies.   Social History   Socioeconomic History   Marital status: Married    Spouse name: Not on file   Number of children: Not on file   Years of education: Not on file   Highest education level: Not on file  Occupational History   Not on file  Tobacco Use   Smoking status: Former    Packs/day: 1.50    Types: Cigarettes  Quit date: 2012    Years since quitting: 11.8   Smokeless tobacco: Never  Vaping Use   Vaping Use: Former   Quit date: 08/28/2010  Substance and Sexual Activity   Alcohol use: Not Currently    Comment: stopped drinking 02/2020   Drug use: Yes    Types: Marijuana    Comment: over 30 yrs ago as of 05/03/21   Sexual activity: Not Currently  Other Topics Concern   Not on file  Social History Narrative   Not on file   Social Determinants of Health   Financial Resource Strain: Not on file  Food Insecurity: Not on file  Transportation Needs: Not on file  Physical Activity: Not on file  Stress: Not on file  Social Connections: Not on file     Family History: The patient's family history  is not on file.  ROS:   Please see the history of present illness.    Review of Systems  Constitutional:  Negative for chills and fever.  HENT:  Negative for hearing loss.   Eyes:  Negative for blurred vision and redness.  Respiratory:  Negative for shortness of breath.   Cardiovascular:  Negative for chest pain, palpitations, orthopnea, claudication, leg swelling and PND.  Gastrointestinal:  Negative for melena, nausea and vomiting.  Genitourinary:  Negative for dysuria and flank pain.  Musculoskeletal:  Negative for joint pain and myalgias.  Neurological:  Negative for dizziness and loss of consciousness.  Psychiatric/Behavioral:  Negative for substance abuse.     EKGs/Labs/Other Studies Reviewed:    The following studies were reviewed today: TTE 2021-10-21: SUMMARY  The left ventricular size is normal.  Left ventricular systolic function is mildly reduced.  LV ejection fraction = 45%.  There are regional wall motion abnormalities as specified below.  There is hypokinesis of the inferolateral and mid to distal  anterolateral/lateral wall.  The right ventricle is borderline dilated.  Right ventricular function cannot be assessed due to poor image  quality.  The aortic sinus is normal size.  The inferior vena cava was not visualized during the exam.  There is no pericardial effusion.  Compared to prior study, Impella CP device has been removed.   -  FINDINGS:  LEFT VENTRICLE  The left ventricular size is normal. There is normal left ventricular  wall thickness. Left ventricular systolic function is mildly reduced.  LV ejection fraction = 45%. Left ventricular filling pattern is  indeterminate. There are regional wall motion abnormalities as  specified below. There is hypokinesis of the inferolateral and mid to  distal anterolateral/lateral wall.   -  RIGHT VENTRICLE  The right ventricle is borderline dilated. Right ventricular function  cannot be assessed due to poor  image quality.   LEFT ATRIUM  The left atrial size is normal.   RIGHT ATRIUM  Right atrial size is normal.  -  AORTIC VALVE  Aortic valve calcification. There is no aortic stenosis. There is  trace aortic regurgitation.  -  MITRAL VALVE  The mitral valve leaflets appear normal. There is trace mitral  regurgitation.  -  TRICUSPID VALVE  Structurally normal tricuspid valve. There is trace tricuspid  regurgitation. There was insufficient TR detected to calculate RV  systolic pressure.  -  PULMONIC VALVE  Limited study, not performed.  -  ARTERIES  The aortic sinus is normal size.  -  VENOUS  The inferior vena cava was not visualized during the exam.  -  EFFUSION  There is no pericardial  effusion.    TTE 09/28/21: FINDINGS:  LEFT VENTRICLE  No LV thrombus seen.  There is an Impella LVAD in appropriate position approximately 3.2 cm  inside the AV. Left ventricular systolic function is moderate to  severely reduced. LV ejection fraction = 30-35%.   Cath 09/27/21; PROCEDURE:  Left heart cath, Coronary angiography, IMA angiography, SVG  angiography and PCI   DIAGNOSTIC FINDINGS:    1.  Subtotal occlusion of the ramus/high first diagonal coronary artery.    This is presumed to be the culprit vessel.  2.  Patent LIMA-LAD bypass graft.  3.  Patent SVG-PDA bypass graft.  4.  Patent radial bypass graft to OM2-OM 3 (jump graft)  5.  Chronically occluded vein graft to the right coronary artery system  (probably unnecessary given patency of the native RCA)  6.  Normal LVEDP, 9 mmHg  7.  A right radial arterial monitoring line was placed.   Due to hypotension, the patient was started an infusion of phenylephrine.   PCI:    Successful angioplasty and stenting of the ramus/high diagonal coronary  artery with a 2.75 x 28 mm Xience drug-eluting stent.   COMPLICATIONS:  None   RECOMMENDATIONS:   Dual antiplatelet therapy for at least 12 months with no elective   interruptions for the first 3 months.   Consultation with a cardiac electrophysiology service for consideration  of a permanent implantable defibrillator.   Aggressive CVD event prevention with lifestyle changes and medical  therapy.   Will assess and replete electrolytes.   I will add amiodarone in hopes of preventing future VT/VF events.   Consultation with critical care service for management of the  ventilator/respiratory failure.  CT Cardiac Morph 12/22/20 IMPRESSION: 1. The left atrial appendage is a small chicken wing morphology without thrombus.   2. A 20 mm Watchman FLX device is recommended based on the average diameter of the landing zone (17.4 mm, 13% compression) due to is oval shape. The appendage is small and maximum diameter (13.2 mm) will likely preclude a 24 mm device. Would recommend discussion with the structural heart team.   3. There is no thrombus in the left atrial appendage.   4. A mid and mid IAS puncture site is recommended.   5. Optimal deployment angle: RAO 20 CRA 24   6. Normal coronary origin. Right dominance.   7. S/p CABG. A LIMA-LAD and graft-diagonal with jump to the OM are seen. The grafts to the PDA/PLV appear occluded. Native coronary assessment not performed due to lack of NTG and heavy calcification.  IMPRESSION: No acute or significant extracardiac abnormality.  Echo 10/25/20 1. Left ventricular ejection fraction, by estimation, is 60 to 65%. Left  ventricular ejection fraction by 3D volume is 60 %. The left ventricle has  normal function. The left ventricle demonstrates regional wall motion  abnormalities (see scoring  diagram/findings for description). Left ventricular diastolic parameters  are consistent with Grade I diastolic dysfunction (impaired relaxation).  There is mild hypokinesis of the left ventricular, mid-apical anteroseptal  wall and anterior wall.   2. Abnormal RV free wall strain at -17%. Right ventricular  systolic  function is normal. The right ventricular size is normal. There is normal  pulmonary artery systolic pressure. The estimated right ventricular  systolic pressure is 21.1 mmHg.   3. Normal LA strain.   4. The mitral valve is grossly normal. Trivial mitral valve  regurgitation.   5. The aortic valve is tricuspid. Aortic valve regurgitation is trivial.  Mild aortic valve sclerosis is present, with no evidence of aortic valve  stenosis.   6. The inferior vena cava is normal in size with <50% respiratory  variability, suggesting right atrial pressure of 8 mmHg.   Comparison(s): Changes from prior study are noted. 02/25/2020: LVEF 50-55%,  LAD territory hypokinesis, RVSP 49 mmHG.   CT Abdomen 08/12/20 IMPRESSION: VASCULAR   1. No evidence for active GI bleeding. 2. Mild atherosclerotic disease in the abdominal aorta. Aortic Atherosclerosis (ICD10-I70.0). 3. Main visceral arteries are patent.   NON-VASCULAR   1. No acute abnormalities in the abdomen or pelvis. 2. Again noted is a large left inguinal hernia containing a large portion of the sigmoid colon. No acute inflammatory changes associated with this hernia and no evidence for bowel obstruction. 3. Colonic diverticulosis. 4. Cholelithiasis. 5. Hepatic steatosis.  Cardiac Monitor 03/28/20: Patient monitoring period was from 03/28/20-04/26/20 Overall burden of Afib was <1% (1 hour and 31 minutes in total). HR ranged in Afib from 93-157bpm Predominant rhythm was NSR with average HR 93bpm (range from 65bpm to 171bpm) Occasional ventricular ectopy (both PVCs and NSVT) with 3% burden; no sustained VT Rare SVE <1% No signficant pauses or sustained ventricular arrhythmias  Echocardiogram 02/25/2020: Impressions: 1. LVEF 50-55% with wall motion abnormalities suspicious for mid LAD. No  apical thrombus.   2. Left ventricular ejection fraction, by estimation, is 50 to 55%. The  left ventricle has low normal function. The left  ventricle demonstrates  regional wall motion abnormalities (see scoring diagram/findings for  description). Left ventricular diastolic   parameters are consistent with Grade II diastolic dysfunction  (pseudonormalization). Elevated left atrial pressure.   3. Right ventricular systolic function is normal. The right ventricular  size is normal. There is moderately elevated pulmonary artery systolic  pressure. The estimated right ventricular systolic pressure is 62.3 mmHg.   4. Left atrial size was mildly dilated.   5. The mitral valve is normal in structure. Moderate mitral valve  regurgitation. No evidence of mitral stenosis.   6. The aortic valve is normal in structure. Aortic valve regurgitation is  not visualized. Mild to moderate aortic valve sclerosis/calcification is  present, without any evidence of aortic stenosis.   7. The inferior vena cava is normal in size with greater than 50%  respiratory variability, suggesting right atrial pressure of 3 mmHg.      Diagnostic cath 03/04/20 Dominance: Right       EKG: EKG was not ordered today 05/17/2020: NSR, septal q waves, isolated PVC, HR 92  Recent Labs: 03/28/2021: TSH 1.31 11/05/2021: Hemoglobin 10.0; Platelets 171 11/06/2021: BUN 51; Creatinine 3.2; Potassium 3.7; Sodium 137  Recent Lipid Panel    Component Value Date/Time   CHOL 121 10/25/2020 0833   TRIG 204 (H) 10/25/2020 0833   HDL 28 (L) 10/25/2020 0833   CHOLHDL 4.3 10/25/2020 0833   CHOLHDL 4 07/26/2020 1144   VLDL 53.6 (H) 07/26/2020 1144   LDLCALC 59 10/25/2020 0833   LDLDIRECT 93.0 07/26/2020 1144     Risk Assessment/Calculations:    CHA2DS2-VASc Score =    This indicates a  % annual risk of stroke. The patient's score is based upon:     Physical Exam:    VS:  There were no vitals taken for this visit.    Wt Readings from Last 3 Encounters:  05/24/21 173 lb 6.4 oz (78.7 kg)  05/16/21 177 lb (80.3 kg)  05/09/21 181 lb (82.1 kg)     GEN:  Well  nourished, well developed in no acute distress HEENT: Normal NECK: No JVD; No carotid bruits CARDIAC: RRR, 2/6 systolic murmur. No no rubs or gallops RESPIRATORY:  Clear to auscultation bilaterally, no wheezes, rales, or rhonchi ABDOMEN: Soft, non-tender, non-distended MUSCULOSKELETAL:  No edema; No deformity  SKIN: Warm and dry NEUROLOGIC:  Alert and oriented x 3 PSYCHIATRIC:  Normal affect   ASSESSMENT:    No diagnosis found.   PLAN:    In order of problems listed above:  #Multivessel CAD #VT Arrest #NSTEMI: Patient presented to [facility] hospital on 02/2020 with hematochezia with course complicated by chest pain with VT arrest on 02/23/20 requiring shock and 2-90min of CPR with return of ROSC. High sensitivity troponin >15,000. EKG with Q waves in V1-V2 and as well as slight ST depression in V4-V6 and lead II. TTE with LVEF of 50-55% with hypokinesis of mid and distal anterior wall, mid and distal anterior septum, and apex as well as grade 2 diastolic dysfunction. Moderate MR and moderately elevated PASP of 48.7 mmHg. He was stabilized from GIB standpoint and underwent coronary angiography found to have multivessel CAD now s/p CABG on 03/08/20. With LIMA -> LAD, LRA -> Ramus -> OM, SVG -> PDA,  SVG -> PLB of RCA. Readmitted to [facility] with out of hospital arrest found to be in [place]. Received CPR with shocks x1 with ROSC. Cath with subtotal occlusion of ramus and D1. Bypass grafts patent. Course complicated by cardiogenic shock requiring pressor and impella placement. EF dropped to 10% but improved to 45% prior to discharge. Currently, *** -Continue ASA 81mg  daily -Loaded with plavix and now transitioned to plavix 75mg  daily -Continue atorvastatin 80mg  daily -Continue metop 25mg  XL daily  -Contnue losartan 50mg  daily   #Paroxysmal Afib: CHADs-vasc 3. Cardiac monitor with episodes of sustained Afib (<1% burden) . LAA not amenable for Watchman. Now maintained on apixaban. -Continue eliquis  5mg  BID -Continue metop 25mg  XL daily -Deemed not to be a good watchman candidate due to unfavorable LAA morphology  #HTN: Well controlled and at goal <120s/80s. -Continue metop 25mg  XL  -Continue losartan 50mg  daily -Goal Bps <120s/80s   #HLD: LDL 59, TG 204.  -Continue atorvastatin 80mg  daily -Continue zetia 10mg  daily -Continue vascepa 2g BID   #Recurrent GIB: Had initialy GIB during hospitalization in 02/2020 thought to be diverticular in nature. Recurrent GIB in 07/2020 as detailed above with relatively unrevealing work-up (? Diverticular vs gastric ulcer). -Continue PPI -? Stop ASA   #DMII:  *** -Continue metformin 1000mg  BID -Continue farxiga 10mg  daily -Continue semaglutide 14mg  daily -Follow-up with PCP as scheduled  #ESRD now on HD: ***  Follow up in 6 months   Medication Adjustments/Labs and Tests Ordered: Current medicines are reviewed at length with the patient today.  Concerns regarding medicines are outlined above.  No orders of the defined types were placed in this encounter.  No orders of the defined types were placed in this encounter.   There are no Patient Instructions on file for this visit.   I,Mykaella Javier,acting as a scribe for Freada Bergeron, MD.,have documented all relevant documentation on the behalf of Freada Bergeron, MD,as directed by  Freada Bergeron, MD while in the presence of Freada Bergeron, MD.  I, Freada Bergeron, MD, have reviewed all documentation for this visit. The documentation on 12/14/21 for the exam, diagnosis, procedures, and orders are all accurate and complete.   Signed, Freada Bergeron, MD  12/14/2021 4:19 PM  [facility]

## 2021-12-18 ENCOUNTER — Ambulatory Visit: Payer: Managed Care, Other (non HMO) | Attending: Cardiology | Admitting: Cardiology

## 2021-12-18 ENCOUNTER — Encounter: Payer: Self-pay | Admitting: Cardiology

## 2021-12-18 VITALS — BP 131/86 | HR 60 | Ht 68.0 in | Wt 160.6 lb

## 2021-12-18 DIAGNOSIS — E782 Mixed hyperlipidemia: Secondary | ICD-10-CM

## 2021-12-18 DIAGNOSIS — I10 Essential (primary) hypertension: Secondary | ICD-10-CM

## 2021-12-18 DIAGNOSIS — K922 Gastrointestinal hemorrhage, unspecified: Secondary | ICD-10-CM

## 2021-12-18 DIAGNOSIS — E119 Type 2 diabetes mellitus without complications: Secondary | ICD-10-CM

## 2021-12-18 DIAGNOSIS — I25118 Atherosclerotic heart disease of native coronary artery with other forms of angina pectoris: Secondary | ICD-10-CM | POA: Diagnosis not present

## 2021-12-18 DIAGNOSIS — Z79899 Other long term (current) drug therapy: Secondary | ICD-10-CM | POA: Diagnosis not present

## 2021-12-18 DIAGNOSIS — Z951 Presence of aortocoronary bypass graft: Secondary | ICD-10-CM | POA: Diagnosis not present

## 2021-12-18 DIAGNOSIS — E1159 Type 2 diabetes mellitus with other circulatory complications: Secondary | ICD-10-CM

## 2021-12-18 DIAGNOSIS — D500 Iron deficiency anemia secondary to blood loss (chronic): Secondary | ICD-10-CM

## 2021-12-18 DIAGNOSIS — I480 Paroxysmal atrial fibrillation: Secondary | ICD-10-CM

## 2021-12-18 DIAGNOSIS — D510 Vitamin B12 deficiency anemia due to intrinsic factor deficiency: Secondary | ICD-10-CM

## 2021-12-18 MED ORDER — ICOSAPENT ETHYL 1 G PO CAPS: 2.0000 g | ORAL_CAPSULE | Freq: Two times a day (BID) | ORAL | 3 refills | Status: DC

## 2021-12-18 MED ORDER — AMIODARONE HCL 100 MG PO TABS: 100.0000 mg | ORAL_TABLET | Freq: Every day | ORAL | 3 refills | Status: DC

## 2021-12-18 MED ORDER — CARVEDILOL 12.5 MG PO TABS: 12.5000 mg | ORAL_TABLET | Freq: Two times a day (BID) | ORAL | 2 refills | Status: DC

## 2021-12-18 NOTE — Patient Instructions (Addendum)
Medication Instructions:   STOP TAKING METOPROLOL SUCCINATE NOW  STOP TAKING LOSARTAN NOW  STOP TAKING HYDRALAZINE NOW  START TAKING CARVEDILOL (COREG) 12.5 MG BY MOUTH TWICE DAILY  DECREASE YOUR AMIODARONE TO 100 MG BY MOUTH DAILY  *If you need a refill on your cardiac medications before your next appointment, please call your pharmacy*   Lab Work:  TOMORROW 12/19/21 HERE IN THE OFFICE--WILL CHECK BMET, CBC W DIFF, AND IRON STUDIES (IRON, TIBC, AND FERRITIN PANEL)  If you have labs (blood work) drawn today and your tests are completely normal, you will receive your results only by: [place] (if you have MyChart) OR A paper copy in the mail If you have any lab test that is abnormal or we need to change your treatment, we will call you to review the results.   Follow-Up:  IN ONE MONTH WITH DR. Cecille Po WILL SEE YOU ON 01/18/22 AT 9:40 AM WITH DR. Johney Frame    Important Information About Sugar

## 2021-12-18 NOTE — Progress Notes (Signed)
Cardiology Office Note:    Date:  12/18/2021   ID:  Colton Neptune Shiv Shuey., DOB [birth year], MRN [id number]  PCP:  [name], MD   [place]  Cardiologist:  Freada Bergeron, MD  Advanced Practice Provider:  No care team member to display Electrophysiologist:  Vickie Epley, MD   Referring MD: [name], MD    History of Present Illness:    Colton Neptune Chee Kinslow. is a 61 y.o. male with a hx of DMII, HLD and HTN and recent prolonged admission at [facility] hospital where he presented with hematochezia with course complicated by chest pain/VT arrest requiring CPR later found to have multivessel CAD s/p CABG on 03/08/20 who now presents to clinic for follow-up.   Patient was hospitalized at [facility] from 02/22/20-03/13/20 where he presented with hematochezia from diverticular source. On 02/23/20, the patient developed chest pain in the setting of a blood bowel movement that initially responded to SL NTG but then recurred with subsequent VT arrest requiring 2-73min CPR and shock. Post-shock rhythm Afib. [place] at [id number]. He was stabilized from GIB standpoint and underwent coronary angiography on 03/04/20 which showed 70% stenosis of the proximal RCA with 50% stenosis of the distal RCA, 70% stenosis of the right PDA, 99% stenosis of the third right PL, 99% stenosis of the second right PL, 90% stenosis of the mid LAD with an ostial LAD stenosis of 70%, 100% stenosis of the ostial to proximal circumflex, and 60% stenosis of the mid LM to ostial LAD.  Due to his severe proximal LAD stenosis and severe mid LAD stenosis coronary bypass grafting was recommended. He underwent successful CABG x5 on 03/08/20 with LIMA to distal LAD, SVG to PDA., SVG tp posterior lateral branch of the right coronary artery, left radial artery to the ramus intermediate and first obtuse marginal coronary arteries.  Following the procedure, he separated from cardiopulmonary bypass without  difficulty. He did well and was successfully discharged home on 03/13/20.  Seen by me on 03/28/20 where he was doing well. Obtained cardiac monitor which showed <1% burden of Afib, 3% burden of PVCs/NSVT. Given Afib outside of the post-operative setting, his plavix was stopped and he was placed on apixaban for [facility].   Was hospitalized at [facility] from 08/12/20-08/18/20 with GIB. During admission, he required 6units pRBCs total. His ASA and apixaban were initially held. Colonoscopy revealed severe diverticulosis but no active bleeding. EGD with nonbleeding gastric ulcers. Tagged RBC scan was also negative. Patient overall improved and H/H remained stable with conservative management with PPI. He was resumed on his apixaban which he tolerated and was discharged home.  He was evaluated by Dr. Quentin Ore for consideration of Watchman however, prewatchman implant CT scan which showed very difficult anatomy with a very shallow landing zone for the watchman device. After three-dimensional reconstruction of the appendage, he was deemed a poor Watchman candidate. May be considered for amulet in the future.   Presented to [facility] in 09/2021 after witnessed cardiac arrest. Initial rhythm was reportedly Vfib. Had 1 round of CPR with 1 shock with successful ROSC. Was taken for cath on 09/27/21 where he underwent DES to D1. Bedside ultrasound demonstrated severely reduced EF 10% and he was subsequently taken from impella placement. Course was further complicated by cardiogenic shock, renal failure requiring HD, embolic stroke, and anemia from blood loss at impella site requiring multiple transfusions. Notably, he was maintained on apixaban, ASA and ticagrelor.   Today,  he is accompanied by a family member. The patient states that he is feeling better now without any recurring chest discomfort. He has been started on Plavix and stopped 81 mg ASA at that time.  Occasionally he notices weakness or stiffness of his RUE and  right hand. 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 This began after his stroke. Overall symptoms are improving.   At home his blood pressure is usually higher in the mornings, such as 150s/90s in the mornings. He has been taking hydralazine, imdur, losartan and metoprolol. States the hydralazine has made him feel poorly and he is hoping to stop it today.   They are concerned about the multitude of medications he is taking. We walked through reconciliation of his regimen today. He reports that Hydralazine causes dizziness and headache as detailed above. Occasionally he does not take his middle dose of hydralazine due to these side effects.  Currently he is off of dialysis. His most recent creatinine is 3.2. As of now he is still taking his losartan but he just resumed meds when he got home. This was stopped at discharge. There is a lot of confusion as to what meds to take which we discussed at length today.  He denies any shortness of breath, or peripheral edema. No syncope, orthopnea, or PND.   Past Medical History:  Diagnosis Date   Anemia    hx iron transfusion   Arthritis    left shoulder   CAD (coronary artery disease) of artery bypass graft 02/2020   Diabetes mellitus without complication (HCC)    type 2   Elevated blood uric acid level    GI bleed    History of blood transfusion 08/19/2020   Hyperlipidemia LDL goal <70    Hypertension    PAF (paroxysmal atrial fibrillation) ([place])    Wears partial dentures    upper dent/lower partial    Past Surgical History:  Procedure Laterality Date   BIOPSY  08/17/2020   Procedure: BIOPSY;  Surgeon: Mauri Pole, MD;  Location: [place] ENDOSCOPY;  Service: Endoscopy;;   COLONOSCOPY WITH PROPOFOL Left 02/27/2020   Procedure: COLONOSCOPY WITH PROPOFOL;  Surgeon: Otis Brace, MD;  Location: [facility] ENDOSCOPY;  Service: Gastroenterology;  Laterality: Left;   COLONOSCOPY WITH  PROPOFOL N/A 08/13/2020   Procedure: COLONOSCOPY WITH PROPOFOL;  Surgeon: Gatha Mayer, MD;  Location: [facility] ENDOSCOPY;  Service: Endoscopy;  Laterality: N/A;   CORONARY ARTERY BYPASS GRAFT N/A 03/08/2020   Procedure: CORONARY ARTERY BYPASS GRAFTING (CABG) x 5 ON PUMP;  Surgeon: Wonda Olds, MD;  Location: [facility] OR;  Service: Open Heart Surgery;  Laterality: N/A;   ENDOVEIN HARVEST OF GREATER SAPHENOUS VEIN Right 03/08/2020   Procedure: ENDOVEIN HARVEST OF GREATER SAPHENOUS VEIN;  Surgeon: Wonda Olds, MD;  Location: [place];  Service: Open Heart Surgery;  Laterality: Right;   ESOPHAGOGASTRODUODENOSCOPY (EGD) WITH PROPOFOL N/A 02/27/2020   Procedure: ESOPHAGOGASTRODUODENOSCOPY (EGD) WITH PROPOFOL;  Surgeon: Otis Brace, MD;  Location: [facility];  Service: Gastroenterology;  Laterality: N/A;   ESOPHAGOGASTRODUODENOSCOPY (EGD) WITH PROPOFOL N/A 08/17/2020   Procedure: ESOPHAGOGASTRODUODENOSCOPY (EGD) WITH PROPOFOL;  Surgeon: Mauri Pole, MD;  Location: [place] ENDOSCOPY;  Service: Endoscopy;  Laterality: N/A;   INSERTION OF MESH Left 05/09/2021   Procedure: INSERTION OF MESH;  Surgeon: Ralene Ok, MD;  Location: [place];  Service: General;  Laterality: Left;   LEFT HEART CATH AND CORONARY ANGIOGRAPHY N/A 03/04/2020  Procedure: LEFT HEART CATH AND CORONARY ANGIOGRAPHY;  Surgeon: Burnell Blanks, MD;  Location: [place] CV LAB;  Service: Cardiovascular;  Laterality: N/A;   RADIAL ARTERY HARVEST Left 03/08/2020   Procedure: RADIAL ARTERY HARVEST;  Surgeon: Wonda Olds, MD;  Location: [place];  Service: Open Heart Surgery;  Laterality: Left;   TEE WITHOUT CARDIOVERSION N/A 03/08/2020   Procedure: TRANSESOPHAGEAL ECHOCARDIOGRAM (TEE);  Surgeon: Wonda Olds, MD;  Location: [place];  Service: Open Heart Surgery;  Laterality: N/A;   XI ROBOTIC ASSISTED INGUINAL HERNIA REPAIR WITH MESH Left 05/09/2021   Procedure: XI ROBOTIC ASSISTED LEFT INGUINAL HERNIA REPAIR WITH MESH;   Surgeon: Ralene Ok, MD;  Location: [place];  Service: General;  Laterality: Left;    Current Medications: Current Meds  Medication Sig   acetaminophen (TYLENOL) 500 MG tablet Take 1,000 mg by mouth every 4 (four) hours as needed (pain).   allopurinol (ZYLOPRIM) 100 MG tablet TAKE 1 TABLET BY MOUTH EVERY DAY   amiodarone (PACERONE) 100 MG tablet Take 1 tablet (100 mg total) by mouth daily.   apixaban (ELIQUIS) 5 MG TABS tablet Take 1 tablet (5 mg total) by mouth 2 (two) times daily.   ASPIRIN LOW DOSE 81 MG EC tablet Take 81 mg by mouth every evening.   atorvastatin (LIPITOR) 80 MG tablet TAKE 1 TABLET BY MOUTH EVERY DAY   carvedilol (COREG) 12.5 MG tablet Take 1 tablet (12.5 mg total) by mouth 2 (two) times daily.   clopidogrel (PLAVIX) 75 MG tablet Take 4 tablets (300 mg total) by mouth for tonight only (on 10/26), then decrease to taking 1 tablet (75 mg total) by mouth daily thereafter.   ezetimibe (ZETIA) 10 MG tablet Take 1 tablet (10 mg total) by mouth daily.   Fe Bisgly-Vit C-Vit B12-FA (GENTLE IRON) 28-60-0.008-0.4 MG CAPS Take 1 tablet by mouth in the morning.   isosorbide mononitrate (IMDUR) 30 MG 24 hr tablet Take 1 tablet (30 mg total) by mouth daily.   Magnesium 300 MG CAPS Take 300 mg by mouth daily.   Multiple Vitamin (MULTIVITAMIN WITH MINERALS) TABS tablet Take 1 tablet by mouth daily.   pantoprazole (PROTONIX) 40 MG tablet Please take 40 mg oral twice daily x30 days, then 40 mg oral daily.   traMADol (ULTRAM) 50 MG tablet Take 1 tablet (50 mg total) by mouth every 12 (twelve) hours as needed.   vitamin B-12 (CYANOCOBALAMIN) 1000 MCG tablet Take 1,000 mcg by mouth in the morning.   [DISCONTINUED] amiodarone (PACERONE) 200 MG tablet Take 1 tablet (200 mg total) by mouth daily.   [DISCONTINUED] hydrALAZINE (APRESOLINE) 25 MG tablet TAKE 1 TABLET BY MOUTH THREE TIMES A DAY   [DISCONTINUED] icosapent Ethyl (VASCEPA) 1 g capsule Take 2 capsules (2 g total) by mouth 2 (two)  times daily.   [DISCONTINUED] losartan (COZAAR) 50 MG tablet Take 1 tablet (50 mg total) by mouth daily.   [DISCONTINUED] metoprolol succinate (TOPROL XL) 25 MG 24 hr tablet Take 1 tablet (25 mg total) by mouth daily.   [DISCONTINUED] Vitamin D, Ergocalciferol, (DRISDOL) 1.25 MG (50000 UNIT) CAPS capsule Take 50,000 Units by mouth every Tuesday.     Allergies:   Patient has no known allergies.   Social History   Socioeconomic History   Marital status: Married    Spouse name: Not on file   Number of children: Not on file   Years of education: Not on file   Highest education level: Not on file  Occupational  History   Not on file  Tobacco Use   Smoking status: Former    Packs/day: 1.50    Types: Cigarettes    Quit date: 2012    Years since quitting: 11.8   Smokeless tobacco: Never  Vaping Use   Vaping Use: Former   Quit date: 08/28/2010  Substance and Sexual Activity   Alcohol use: Not Currently    Comment: stopped drinking 02/2020   Drug use: Yes    Types: Marijuana    Comment: over 30 yrs ago as of 05/03/21   Sexual activity: Not Currently  Other Topics Concern   Not on file  Social History Narrative   Not on file   Social Determinants of Health   Financial Resource Strain: Not on file  Food Insecurity: Not on file  Transportation Needs: Not on file  Physical Activity: Not on file  Stress: Not on file  Social Connections: Not on file     Family History: The patient's family history is not on file.  ROS:   Please see the history of present illness.    Review of Systems  Constitutional:  Negative for chills and fever.  HENT:  Negative for hearing loss.   Eyes:  Negative for blurred vision and redness.  Respiratory:  Negative for shortness of breath.   Cardiovascular:  Positive for palpitations. Negative for chest pain, orthopnea, claudication, leg swelling and PND.  Gastrointestinal:  Negative for melena, nausea and vomiting.  Genitourinary:  Negative for  dysuria and flank pain.  Musculoskeletal:  Negative for joint pain and myalgias.  Neurological:  Positive for dizziness and focal weakness (RUE weakness, right hand stiffness). Negative for loss of consciousness.  Psychiatric/Behavioral:  Negative for substance abuse.     EKGs/Labs/Other Studies Reviewed:    The following studies were reviewed today:  TTE 10/02/21 ([place]): SUMMARY  The left ventricular size is normal.  Left ventricular systolic function is mildly reduced.  LV ejection fraction = 45%.  There are regional wall motion abnormalities as specified below.  There is hypokinesis of the inferolateral and mid to distal  anterolateral/lateral wall.  The right ventricle is borderline dilated.  Right ventricular function cannot be assessed due to poor image  quality.  The aortic sinus is normal size.  The inferior vena cava was not visualized during the exam.  There is no pericardial effusion.  Compared to prior study, Impella CP device has been removed.   -  FINDINGS:  LEFT VENTRICLE  The left ventricular size is normal. There is normal left ventricular  wall thickness. Left ventricular systolic function is mildly reduced.  LV ejection fraction = 45%. Left ventricular filling pattern is  indeterminate. There are regional wall motion abnormalities as  specified below. There is hypokinesis of the inferolateral and mid to  distal anterolateral/lateral wall.   -  RIGHT VENTRICLE  The right ventricle is borderline dilated. Right ventricular function  cannot be assessed due to poor image quality.   LEFT ATRIUM  The left atrial size is normal.   RIGHT ATRIUM  Right atrial size is normal.  -  AORTIC VALVE  Aortic valve calcification. There is no aortic stenosis. There is  trace aortic regurgitation.  -  MITRAL VALVE  The mitral valve leaflets appear normal. There is trace mitral  regurgitation.  -  TRICUSPID VALVE  Structurally normal tricuspid valve.  There is trace tricuspid  regurgitation. There was insufficient TR detected to calculate RV  systolic pressure.  -  PULMONIC VALVE  Limited study, not performed.  -  ARTERIES  The aortic sinus is normal size.  -  VENOUS  The inferior vena cava was not visualized during the exam.  -  EFFUSION  There is no pericardial effusion.    TTE 09/28/21 ([place]): FINDINGS:  LEFT VENTRICLE  No LV thrombus seen.  There is an Impella LVAD in appropriate position approximately 3.2 cm  inside the AV. Left ventricular systolic function is moderate to  severely reduced. LV ejection fraction = 30-35%.    Cath 09/27/21 ([place]): PROCEDURE:  Left heart cath, Coronary angiography, IMA angiography, SVG angiography and PCI   DIAGNOSTIC FINDINGS:    1.  Subtotal occlusion of the ramus/high first diagonal coronary artery.    This is presumed to be the culprit vessel.  2.  Patent LIMA-LAD bypass graft.  3.  Patent SVG-PDA bypass graft.  4.  Patent radial bypass graft to OM2-OM 3 (jump graft)  5.  Chronically occluded vein graft to the right coronary artery system  (probably unnecessary given patency of the native RCA)  6.  Normal LVEDP, 9 mmHg  7.  A right radial arterial monitoring line was placed.   Due to hypotension, the patient was started an infusion of phenylephrine.   PCI:    Successful angioplasty and stenting of the ramus/high diagonal coronary  artery with a 2.75 x 28 mm Xience drug-eluting stent.   COMPLICATIONS:  None   RECOMMENDATIONS:   Dual antiplatelet therapy for at least 12 months with no elective  interruptions for the first 3 months.   Consultation with a cardiac electrophysiology service for consideration  of a permanent implantable defibrillator.   Aggressive CVD event prevention with lifestyle changes and medical  therapy.   Will assess and replete electrolytes.   I will add amiodarone in hopes of preventing future VT/VF events.    Consultation with critical care service for management of the  ventilator/respiratory failure.   CT Cardiac Morph 12/22/20 IMPRESSION: 1. The left atrial appendage is a small chicken wing morphology without thrombus.   2. A 20 mm Watchman FLX device is recommended based on the average diameter of the landing zone (17.4 mm, 13% compression) due to is oval shape. The appendage is small and maximum diameter (13.2 mm) will likely preclude a 24 mm device. Would recommend discussion with the structural heart team.   3. There is no thrombus in the left atrial appendage.   4. A mid and mid IAS puncture site is recommended.   5. Optimal deployment angle: RAO 20 CRA 24   6. Normal coronary origin. Right dominance.   7. S/p CABG. A LIMA-LAD and graft-diagonal with jump to the OM are seen. The grafts to the PDA/PLV appear occluded. Native coronary assessment not performed due to lack of NTG and heavy calcification.  IMPRESSION: No acute or significant extracardiac abnormality.  Echo 10/25/20 1. Left ventricular ejection fraction, by estimation, is 60 to 65%. Left  ventricular ejection fraction by 3D volume is 60 %. The left ventricle has  normal function. The left ventricle demonstrates regional wall motion  abnormalities (see scoring  diagram/findings for description). Left ventricular diastolic parameters  are consistent with Grade I diastolic dysfunction (impaired relaxation).  There is mild hypokinesis of the left ventricular, mid-apical anteroseptal  wall and anterior wall.   2. Abnormal RV free wall strain at -17%. Right ventricular systolic  function is normal. The right ventricular size is normal. There is normal  pulmonary artery systolic pressure. The estimated right ventricular  systolic pressure is 41.9 mmHg.   3. Normal LA strain.   4. The mitral valve is grossly normal. Trivial mitral valve  regurgitation.   5. The aortic valve is tricuspid. Aortic valve regurgitation is  trivial.  Mild aortic valve sclerosis is present, with no evidence of aortic valve  stenosis.   6. The inferior vena cava is normal in size with <50% respiratory  variability, suggesting right atrial pressure of 8 mmHg.   Comparison(s): Changes from prior study are noted. 02/25/2020: LVEF 50-55%,  LAD territory hypokinesis, RVSP 49 mmHG.   CT Abdomen 08/12/20 IMPRESSION: VASCULAR   1. No evidence for active GI bleeding. 2. Mild atherosclerotic disease in the abdominal aorta. Aortic Atherosclerosis (ICD10-I70.0). 3. Main visceral arteries are patent.   NON-VASCULAR   1. No acute abnormalities in the abdomen or pelvis. 2. Again noted is a large left inguinal hernia containing a large portion of the sigmoid colon. No acute inflammatory changes associated with this hernia and no evidence for bowel obstruction. 3. Colonic diverticulosis. 4. Cholelithiasis. 5. Hepatic steatosis.  Cardiac Monitor 03/28/20: Patient monitoring period was from 03/28/20-04/26/20 Overall burden of Afib was <1% (1 hour and 31 minutes in total). HR ranged in Afib from 93-157bpm Predominant rhythm was NSR with average HR 93bpm (range from 65bpm to 171bpm) Occasional ventricular ectopy (both PVCs and NSVT) with 3% burden; no sustained VT Rare SVE <1% No signficant pauses or sustained ventricular arrhythmias  Echocardiogram 02/25/2020: Impressions: 1. LVEF 50-55% with wall motion abnormalities suspicious for mid LAD. No  apical thrombus.   2. Left ventricular ejection fraction, by estimation, is 50 to 55%. The  left ventricle has low normal function. The left ventricle demonstrates  regional wall motion abnormalities (see scoring diagram/findings for  description). Left ventricular diastolic   parameters are consistent with Grade II diastolic dysfunction  (pseudonormalization). Elevated left atrial pressure.   3. Right ventricular systolic function is normal. The right ventricular  size is normal. There is  moderately elevated pulmonary artery systolic  pressure. The estimated right ventricular systolic pressure is 62.2 mmHg.   4. Left atrial size was mildly dilated.   5. The mitral valve is normal in structure. Moderate mitral valve  regurgitation. No evidence of mitral stenosis.   6. The aortic valve is normal in structure. Aortic valve regurgitation is  not visualized. Mild to moderate aortic valve sclerosis/calcification is  present, without any evidence of aortic stenosis.   7. The inferior vena cava is normal in size with greater than 50%  respiratory variability, suggesting right atrial pressure of 3 mmHg.      Diagnostic cath 03/04/20 Dominance: Right       EKG:   EKG is personally reviewed. 12/18/2021:  Sinus rhythm. Rate 60 bpm. Septal infarct. 05/17/2020: NSR, septal q waves, isolated PVC, HR 92  Recent Labs: 03/28/2021: TSH 1.31 11/05/2021: Hemoglobin 10.0; Platelets 171 11/06/2021: BUN 51; Creatinine 3.2; Potassium 3.7; Sodium 137   Recent Lipid Panel    Component Value Date/Time   CHOL 121 10/25/2020 0833   TRIG 204 (H) 10/25/2020 0833   HDL 28 (L) 10/25/2020 0833   CHOLHDL 4.3 10/25/2020 0833   CHOLHDL 4 07/26/2020 1144   VLDL 53.6 (H) 07/26/2020 1144   LDLCALC 59 10/25/2020 0833   LDLDIRECT 93.0 07/26/2020 1144     Risk Assessment/Calculations:    CHA2DS2-VASc Score =    This indicates a  % annual risk of stroke. The patient's  score is based upon:     Physical Exam:    VS:  BP 131/86 (BP Location: Right Arm, Patient Position: Sitting, Cuff Size: Normal)   Pulse 60   Ht 5\' 8"  (1.727 m)   Wt 160 lb 9.6 oz (72.8 kg)   SpO2 96%   BMI 24.42 kg/m     Wt Readings from Last 3 Encounters:  12/18/21 160 lb 9.6 oz (72.8 kg)  05/24/21 173 lb 6.4 oz (78.7 kg)  05/16/21 177 lb (80.3 kg)     GEN:  Well nourished, well developed in no acute distress HEENT: Normal NECK: No JVD; No carotid bruits CARDIAC: RRR, 2/6 systolic murmur. No rubs or  gallops RESPIRATORY:  Clear to auscultation bilaterally, no wheezes, rales, or rhonchi ABDOMEN: Soft, non-tender, non-distended MUSCULOSKELETAL:  No edema; No deformity  SKIN: Warm and dry NEUROLOGIC:  Alert and oriented x 3 PSYCHIATRIC:  Normal affect   ASSESSMENT:    1. Coronary artery disease of native artery of native heart with stable angina pectoris ([place])   2. Mixed hyperlipidemia   3. H/O five vessel coronary artery bypass   4. Medication management   5. Gastrointestinal hemorrhage, unspecified gastrointestinal hemorrhage type   6. Diabetes mellitus with coincident hypertension ([place])   7. Type 2 diabetes mellitus with other circulatory complication, without long-term current use of insulin ([place])   8. Vitamin B12 deficiency anemia due to intrinsic factor deficiency   9. Iron deficiency anemia due to chronic blood loss   10. Primary hypertension   11. Paroxysmal atrial fibrillation (HCC)     PLAN:    In order of problems listed above:  #Multivessel CAD #VT Arrest #NSTEMI: Patient presented to [facility] hospital on 02/2020 with hematochezia with course complicated by chest pain with VT arrest on 02/23/20 requiring shock and 2-58min of CPR with return of ROSC. High sensitivity troponin >15,000. EKG with Q waves in V1-V2 and as well as slight ST depression in V4-V6 and lead II. TTE with LVEF of 50-55% with hypokinesis of mid and distal anterior wall, mid and distal anterior septum, and apex as well as grade 2 diastolic dysfunction. Moderate MR and moderately elevated PASP of 48.7 mmHg. He was stabilized from GIB standpoint and underwent coronary angiography found to have multivessel CAD now s/p CABG on 03/08/20. With LIMA -> LAD, LRA -> Ramus -> OM, SVG -> PDA,  SVG -> PLB of RCA. Readmitted to [facility] with out of hospital arrest found to be in [place]. Received CPR with shocks x1 with ROSC. Cath with subtotal occlusion of ramus and D1. Bypass grafts patent. Course complicated by cardiogenic  shock requiring pressor and impella placement. EF dropped to 10% but improved to 45% prior to discharge. Currently, doing much better without recurrence of anginal symptoms. -Loaded with plavix and now transitioned to plavix 75mg  daily -Okay to hold ASA given he is on apixaban and is >58mo post PCI -Continue atorvastatin 80mg  daily -Resume zetia 10mg  and vascepa 2g BID -Change metop to coreg 12.5mg  BID -Hold losartan given significant CKD with Cr 3.2   #Paroxysmal Afib: CHADs-vasc 3. Cardiac monitor with episodes of sustained Afib (<1% burden) . LAA not amenable for Watchman. Now maintained on apixaban. -Decrease amiodarone to 100mg  daily; hopefully can wean off over the next several months -Continue eliquis 5mg  BID -Change metop to coreg 12.5mg  BID for better BP control -Deemed not to be a good watchman candidate due to unfavorable LAA morphology  #HTN: Elevated in AM but better controlled in  evening. Will adjust as below as not tolerating hydralazine. 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 Recurrent GIB in 07/2020 as detailed above with relatively unrevealing work-up (? Diverticular vs gastric ulcer). -Continue PPI -Check CBC and iron studies -High risk for re-bleed given need for apixaban and plavix   #DMII:  -Management per PCP  #CKD IV: Developed acute renal failure during recent hospitalization requiring HD. Cr improved to 3.2. Following with nephrology. Not on HD currently. -Repeat BMET  -Follow-up with renal as scheduled  Follow up in 1 month.   Medication Adjustments/Labs and Tests  Ordered: Current medicines are reviewed at length with the patient today.  Concerns regarding medicines are outlined above.   Orders Placed This Encounter  Procedures   Iron, TIBC and Ferritin Panel   Basic metabolic panel   CBC w/Diff   EKG 12-Lead   Meds ordered this encounter  Medications   icosapent Ethyl (VASCEPA) 1 g capsule    Sig: Take 2 capsules (2 g total) by mouth 2 (two) times daily.    Dispense:  360 capsule    Refill:  3   amiodarone (PACERONE) 100 MG tablet    Sig: Take 1 tablet (100 mg total) by mouth daily.    Dispense:  90 tablet    Refill:  3    DOSE DECREASE   carvedilol (COREG) 12.5 MG tablet    Sig: Take 1 tablet (12.5 mg total) by mouth 2 (two) times daily.    Dispense:  180 tablet    Refill:  2   Patient Instructions  Medication Instructions:   STOP TAKING METOPROLOL SUCCINATE NOW  STOP TAKING LOSARTAN NOW  STOP TAKING HYDRALAZINE NOW  START TAKING CARVEDILOL (COREG) 12.5 MG BY MOUTH TWICE DAILY  DECREASE YOUR AMIODARONE TO 100 MG BY MOUTH DAILY  *If you need a refill on your cardiac medications before your next appointment, please call your pharmacy*   Lab Work:  TOMORROW 12/19/21 HERE IN THE OFFICE--WILL CHECK BMET, CBC W DIFF, AND IRON STUDIES (IRON, TIBC, AND FERRITIN PANEL)  If you have labs (blood work) drawn today and your tests are completely normal, you will receive your results only by: [facility] (if you have MyChart) OR A paper copy in the mail If you have any lab test that is abnormal or we need to change your treatment, we will call you to review the results.   Follow-Up:  IN ONE MONTH WITH DR. Cecille Po WILL SEE YOU ON 01/18/22 AT 9:40 AM WITH DR. Johney Frame    Important Information About Sugar        I,Mathew Stumpf,acting as a scribe for Freada Bergeron, MD.,have documented all relevant documentation on the behalf of Freada Bergeron, MD,as directed by  Freada Bergeron, MD while in the presence  of Freada Bergeron, MD.  I, Freada Bergeron, MD, have reviewed all documentation for this visit. The documentation on 12/18/21 for the exam, diagnosis, procedures, and orders are all accurate and complete.   Signed, Freada Bergeron, MD  12/18/2021 5:46 PM     [facility]

## 2021-12-19 ENCOUNTER — Ambulatory Visit: Payer: Managed Care, Other (non HMO) | Attending: Cardiology

## 2021-12-19 DIAGNOSIS — Z79899 Other long term (current) drug therapy: Secondary | ICD-10-CM

## 2021-12-19 DIAGNOSIS — E782 Mixed hyperlipidemia: Secondary | ICD-10-CM

## 2021-12-19 DIAGNOSIS — D500 Iron deficiency anemia secondary to blood loss (chronic): Secondary | ICD-10-CM

## 2021-12-19 DIAGNOSIS — D510 Vitamin B12 deficiency anemia due to intrinsic factor deficiency: Secondary | ICD-10-CM

## 2021-12-19 DIAGNOSIS — K922 Gastrointestinal hemorrhage, unspecified: Secondary | ICD-10-CM

## 2021-12-19 DIAGNOSIS — E119 Type 2 diabetes mellitus without complications: Secondary | ICD-10-CM

## 2021-12-19 DIAGNOSIS — I25118 Atherosclerotic heart disease of native coronary artery with other forms of angina pectoris: Secondary | ICD-10-CM

## 2021-12-19 DIAGNOSIS — Z951 Presence of aortocoronary bypass graft: Secondary | ICD-10-CM

## 2021-12-19 DIAGNOSIS — E1159 Type 2 diabetes mellitus with other circulatory complications: Secondary | ICD-10-CM

## 2021-12-19 LAB — BASIC METABOLIC PANEL
BUN/Creatinine Ratio: 10 (ref 9–20)
BUN: 13 mg/dL (ref 6–24)
CO2: 26 mmol/L (ref 20–29)
Calcium: 9.4 mg/dL (ref 8.7–10.2)
Chloride: 101 mmol/L (ref 96–106)
Creatinine, Ser: 1.34 mg/dL — ABNORMAL HIGH (ref 0.76–1.27)
Glucose: 128 mg/dL — ABNORMAL HIGH (ref 70–99)
Potassium: 4.6 mmol/L (ref 3.5–5.2)
Sodium: 138 mmol/L (ref 134–144)
eGFR: 61 mL/min/{1.73_m2} (ref >59)

## 2021-12-19 LAB — CBC WITH DIFFERENTIAL/PLATELET
Basophils Absolute: 0 10*3/uL (ref 0.0–0.2)
Basos: 1 %
EOS (ABSOLUTE): 0.1 10*3/uL (ref 0.0–0.4)
Eos: 3 %
Hematocrit: 36.5 % — ABNORMAL LOW (ref 37.5–51.0)
Hemoglobin: 11.7 g/dL — ABNORMAL LOW (ref 13.0–17.7)
Immature Grans (Abs): 0 10*3/uL (ref 0.0–0.1)
Immature Granulocytes: 0 %
Lymphocytes Absolute: 2.5 10*3/uL (ref 0.7–3.1)
Lymphs: 45 %
MCH: 30.5 pg (ref 26.6–33.0)
MCHC: 32.1 g/dL (ref 31.5–35.7)
MCV: 95 fL (ref 79–97)
Monocytes Absolute: 0.3 10*3/uL (ref 0.1–0.9)
Monocytes: 6 %
Neutrophils Absolute: 2.7 10*3/uL (ref 1.4–7.0)
Neutrophils: 45 %
Platelets: 174 10*3/uL (ref 150–450)
RBC: 3.83 x10E6/uL — ABNORMAL LOW (ref 4.14–5.80)
RDW: 15.4 % (ref 11.6–15.4)
WBC: 5.7 10*3/uL (ref 3.4–10.8)

## 2021-12-19 LAB — IRON,TIBC AND FERRITIN PANEL
Ferritin: 843 ng/mL — ABNORMAL HIGH (ref 30–400)
Iron Saturation: 26 % (ref 15–55)
Iron: 74 ug/dL (ref 38–169)
Total Iron Binding Capacity: 282 ug/dL (ref 250–450)
UIBC: 208 ug/dL (ref 111–343)

## 2021-12-20 ENCOUNTER — Telehealth: Payer: Self-pay | Admitting: Cardiology

## 2021-12-20 NOTE — Telephone Encounter (Signed)
The patient has been notified of the result and verbalized understanding.  All questions (if any) were answered.  Pt states his BP's have been running on the lower and normal side for him.  Pt states his pressures are running in the 110s over 70-80s.    Advised the pt that being his pressures are normal, we will hold off on starting him back on losartan at this time.  Advised him to continue monitoring his numbers at home and touch base with [place] as needed, if pressures start to elevate, so that we can restart him on losartan at that time, and arrange for follow-up labs thereafter.    Pt verbalized understanding and agrees with this plan.  Dr. Johney Frame made aware of this good news and agreed with instruction I provided to the pt.

## 2021-12-20 NOTE — Telephone Encounter (Signed)
-----   Message from Freada Bergeron, MD sent at 12/20/2021  2:24 PM EDT ----- Iron levels are so much better!! Cr is also SO much better at 1.34 (from 3.2). Electrolytes look great. Hemoglobin is also better at 11.7. This is fantastic. He can actually start back on the losartan 50mg  daily if his blood pressure is elevated. If so, we would need to repeat BMET in 7-10 days after starting.

## 2021-12-20 NOTE — Telephone Encounter (Signed)
Patient returning call for lab results. 

## 2022-01-02 ENCOUNTER — Encounter: Payer: Self-pay | Admitting: Cardiology

## 2022-01-04 ENCOUNTER — Telehealth: Payer: Self-pay | Admitting: Internal Medicine

## 2022-01-04 NOTE — Telephone Encounter (Signed)
Pt's wife has been informed that she would need to start a claim with the social security administration to receive the forms that PCP would need to complete. She expressed understanding.

## 2022-01-04 NOTE — Telephone Encounter (Signed)
Patient's wife called and said that he will need disability forms fill out.  Patient's wife states that she was told that we would have the forms.  Please advise.

## 2022-01-05 ENCOUNTER — Other Ambulatory Visit: Payer: Self-pay | Admitting: *Deleted

## 2022-01-05 MED ORDER — CLOPIDOGREL BISULFATE 75 MG PO TABS
75.0000 mg | ORAL_TABLET | Freq: Every day | ORAL | 3 refills | Status: DC
  Filled 2022-07-20 – 2022-10-02 (×2): qty 30, 30d supply, fill #0
  Filled 2022-10-31 – 2022-11-19 (×2): qty 30, 30d supply, fill #1
  Filled 2022-12-25: qty 30, 30d supply, fill #2

## 2022-01-16 NOTE — Progress Notes (Unsigned)
Cardiology Office Note:    Date:  01/18/2022   ID:  Colton Neptune Briceson Broadwater., DOB 01-Nov-1960, MRN [id number]  PCP:  [name], MD   [place]  Cardiologist:  Freada Bergeron, MD  Advanced Practice Provider:  No care team member to display Electrophysiologist:  Vickie Epley, MD   Referring MD: [name], MD    History of Present Illness:    Colton Neptune Kayston Jodoin. is a 61 y.o. male with a hx of DMII, HLD and HTN and recent prolonged admission at [facility] hospital where he presented with hematochezia with course complicated by chest pain/VT arrest requiring CPR later found to have multivessel CAD s/p CABG on 03/08/20 who now presents to clinic for follow-up.   Patient was hospitalized at [facility] from 02/22/20-03/13/20 where he presented with hematochezia from diverticular source. On 02/23/20, the patient developed chest pain in the setting of a blood bowel movement that initially responded to SL NTG but then recurred with subsequent VT arrest requiring 2-27min CPR and shock. Post-shock rhythm Afib. [place]rop [place] at [id number]. He was stabilized from GIB standpoint and underwent coronary angiography on 03/04/20 which showed 70% stenosis of the proximal RCA with 50% stenosis of the distal RCA, 70% stenosis of the right PDA, 99% stenosis of the third right PL, 99% stenosis of the second right PL, 90% stenosis of the mid LAD with an ostial LAD stenosis of 70%, 100% stenosis of the ostial to proximal circumflex, and 60% stenosis of the mid LM to ostial LAD.  Due to his severe proximal LAD stenosis and severe mid LAD stenosis coronary bypass grafting was recommended. He underwent successful CABG x5 on 03/08/20 with LIMA to distal LAD, SVG to PDA., SVG tp posterior lateral branch of the right coronary artery, left radial artery to the ramus intermediate and first obtuse marginal coronary arteries.  Following the procedure, he separated from cardiopulmonary bypass without  difficulty. He did well and was successfully discharged home on 03/13/20.  Seen by me on 03/28/20 where he was doing well. Obtained cardiac monitor which showed <1% burden of Afib, 3% burden of PVCs/NSVT. Given Afib outside of the post-operative setting, his plavix was stopped and he was placed on apixaban for [facility].   Was hospitalized at [facility] from 08/12/20-08/18/20 with GIB. During admission, he required 6units pRBCs total. His ASA and apixaban were initially held. Colonoscopy revealed severe diverticulosis but no active bleeding. EGD with nonbleeding gastric ulcers. Tagged RBC scan was also negative. Patient overall improved and H/H remained stable with conservative management with PPI. He was resumed on his apixaban which he tolerated and was discharged home.  He was evaluated by Dr. Quentin Ore for consideration of Watchman however, prewatchman implant CT scan which showed very difficult anatomy with a very shallow landing zone for the watchman device. After three-dimensional reconstruction of the appendage, he was deemed a poor Watchman candidate. May be considered for amulet in the future.   Presented to [facility] in 09/2021 after witnessed cardiac arrest. Initial rhythm was reportedly Vfib. Had 1 round of CPR with 1 shock with successful ROSC. Was taken for cath on 09/27/21 where he underwent DES to D1. Bedside ultrasound demonstrated severely reduced EF 10% and he was subsequently taken from impella placement. Course was further complicated by cardiogenic shock, renal failure requiring HD, embolic stroke, and anemia from blood loss at impella site requiring multiple transfusions. Notably, he was maintained on apixaban, ASA and ticagrelor.   Was  last seen in clinic on 12/18/21 where he was much better. We resumed his plavix. We made several medication changes at that time. Renal function had significantly improved with Cr 3.2>1.34.  Today, the patient states that he overall feels okay. Lifted a  heavy bag and developed left sided chest pressure. Took aspirin with improvement. Continues to have mild pain today with pinpoint pressure on exam.    Otherwise, no SOB, lightheadedness, dizziness, LE edema, orthopnea or PND. Palpitations are overall improved on the coreg. No bleeding on apixaban and plavix.  Blood pressure has been mainly 130-150s at home. Tolerating medications. Able to ambulate with a cane without significant exertional symptoms.   Past Medical History:  Diagnosis Date   Anemia    hx iron transfusion   Arthritis    left shoulder   CAD (coronary artery disease) of artery bypass graft 02/2020   Diabetes mellitus without complication (HCC)    type 2   Elevated blood uric acid level    GI bleed    History of blood transfusion 08/19/2020   Hyperlipidemia LDL goal <70    Hypertension    PAF (paroxysmal atrial fibrillation) ([place])    Wears partial dentures    upper dent/lower partial    Past Surgical History:  Procedure Laterality Date   BIOPSY  08/17/2020   Procedure: BIOPSY;  Surgeon: Mauri Pole, MD;  Location: [place] [facility];  Service: Endoscopy;;   COLONOSCOPY WITH PROPOFOL Left 02/27/2020   Procedure: COLONOSCOPY WITH PROPOFOL;  Surgeon: Otis Brace, MD;  Location: [facility];  Service: Gastroenterology;  Laterality: Left;   COLONOSCOPY WITH PROPOFOL N/A 08/13/2020   Procedure: COLONOSCOPY WITH PROPOFOL;  Surgeon: Gatha Mayer, MD;  Location: [facility];  Service: Endoscopy;  Laterality: N/A;   CORONARY ARTERY BYPASS GRAFT N/A 03/08/2020   Procedure: CORONARY ARTERY BYPASS GRAFTING (CABG) x 5 ON PUMP;  Surgeon: Wonda Olds, MD;  Location: [facility] OR;  Service: Open Heart Surgery;  Laterality: N/A;   ENDOVEIN HARVEST OF GREATER SAPHENOUS VEIN Right 03/08/2020   Procedure: ENDOVEIN HARVEST OF GREATER SAPHENOUS VEIN;  Surgeon: Wonda Olds, MD;  Location: [place];  Service: Open Heart Surgery;  Laterality: Right;   ESOPHAGOGASTRODUODENOSCOPY (EGD)  WITH PROPOFOL N/A 02/27/2020   Procedure: ESOPHAGOGASTRODUODENOSCOPY (EGD) WITH PROPOFOL;  Surgeon: Otis Brace, MD;  Location: [facility];  Service: Gastroenterology;  Laterality: N/A;   ESOPHAGOGASTRODUODENOSCOPY (EGD) WITH PROPOFOL N/A 08/17/2020   Procedure: ESOPHAGOGASTRODUODENOSCOPY (EGD) WITH PROPOFOL;  Surgeon: Mauri Pole, MD;  Location: [place] [facility];  Service: Endoscopy;  Laterality: N/A;   INSERTION OF MESH Left 05/09/2021   Procedure: INSERTION OF MESH;  Surgeon: Ralene Ok, MD;  Location: [place];  Service: General;  Laterality: Left;   LEFT HEART CATH AND CORONARY ANGIOGRAPHY N/A 03/04/2020   Procedure: LEFT HEART CATH AND CORONARY ANGIOGRAPHY;  Surgeon: Burnell Blanks, MD;  Location: [place] [facility];  Service: Cardiovascular;  Laterality: N/A;   RADIAL ARTERY HARVEST Left 03/08/2020   Procedure: RADIAL ARTERY HARVEST;  Surgeon: Wonda Olds, MD;  Location: [place];  Service: Open Heart Surgery;  Laterality: Left;   TEE WITHOUT CARDIOVERSION N/A 03/08/2020   Procedure: TRANSESOPHAGEAL ECHOCARDIOGRAM (TEE);  Surgeon: Wonda Olds, MD;  Location: [place];  Service: Open Heart Surgery;  Laterality: N/A;   XI ROBOTIC ASSISTED INGUINAL HERNIA REPAIR WITH MESH Left 05/09/2021   Procedure: XI ROBOTIC ASSISTED LEFT INGUINAL HERNIA REPAIR WITH MESH;  Surgeon: Ralene Ok, MD;  Location: [place];  Service:  General;  Laterality: Left;    Current Medications: Current Meds  Medication Sig   acetaminophen (TYLENOL) 500 MG tablet Take 1,000 mg by mouth every 4 (four) hours as needed (pain).   allopurinol (ZYLOPRIM) 100 MG tablet TAKE 1 TABLET BY MOUTH EVERY DAY   apixaban (ELIQUIS) 5 MG TABS tablet Take 1 tablet (5 mg total) by mouth 2 (two) times daily.   ASPIRIN LOW DOSE 81 MG EC tablet Take 81 mg by mouth every 6 (six) hours as needed for moderate pain.   atorvastatin (LIPITOR) 80 MG tablet TAKE 1 TABLET BY MOUTH EVERY DAY   carvedilol (COREG) 12.5 MG tablet  Take 1 tablet (12.5 mg total) by mouth 2 (two) times daily.   clopidogrel (PLAVIX) 75 MG tablet Take 1 tablet (75 mg total) by mouth daily.   dapagliflozin propanediol (FARXIGA) 10 MG TABS tablet Take 1 tablet (10 mg total) by mouth daily before breakfast.   ezetimibe (ZETIA) 10 MG tablet Take 1 tablet (10 mg total) by mouth daily.   icosapent Ethyl (VASCEPA) 1 g capsule Take 2 capsules (2 g total) by mouth 2 (two) times daily.   isosorbide mononitrate (IMDUR) 30 MG 24 hr tablet Take 1 tablet (30 mg total) by mouth daily.   Magnesium 300 MG CAPS Take 300 mg by mouth daily.   Multiple Vitamin (MULTIVITAMIN WITH MINERALS) TABS tablet Take 1 tablet by mouth daily.   pantoprazole (PROTONIX) 40 MG tablet Please take 40 mg oral twice daily x30 days, then 40 mg oral daily.   sacubitril-valsartan (ENTRESTO) 24-26 MG Take 1 tablet by mouth 2 (two) times daily.   traMADol (ULTRAM) 50 MG tablet Take 1 tablet (50 mg total) by mouth every 12 (twelve) hours as needed.   vitamin B-12 (CYANOCOBALAMIN) 1000 MCG tablet Take 1,000 mcg by mouth in the morning.   [DISCONTINUED] amiodarone (PACERONE) 100 MG tablet Take 1 tablet (100 mg total) by mouth daily.     Allergies:   Propofol   Social History   Socioeconomic History   Marital status: Married    Spouse name: Not on file   Number of children: Not on file   Years of education: Not on file   Highest education level: Not on file  Occupational History   Not on file  Tobacco Use   Smoking status: Former    Packs/day: 1.50    Types: Cigarettes    Quit date: 2012    Years since quitting: 11.9   Smokeless tobacco: Never  Vaping Use   Vaping Use: Former   Quit date: 08/28/2010  Substance and Sexual Activity   Alcohol use: Not Currently    Comment: stopped drinking 02/2020   Drug use: Yes    Types: Marijuana    Comment: over 30 yrs ago as of 05/03/21   Sexual activity: Not Currently  Other Topics Concern   Not on file  Social History Narrative   Not  on file   Social Determinants of Health   Financial Resource Strain: Not on file  Food Insecurity: Not on file  Transportation Needs: Not on file  Physical Activity: Not on file  Stress: Not on file  Social Connections: Not on file     Family History: The patient's family history is not on file.  ROS:   Please see the history of present illness.    Review of Systems  Constitutional:  Negative for chills and fever.  HENT:  Negative for hearing loss.   Eyes:  Negative  for blurred vision and redness.  Respiratory:  Negative for shortness of breath.   Cardiovascular:  Positive for chest pain and palpitations. Negative for orthopnea, claudication, leg swelling and PND.  Gastrointestinal:  Negative for blood in stool, melena, nausea and vomiting.  Genitourinary:  Negative for dysuria, flank pain and hematuria.  Musculoskeletal:  Negative for joint pain and myalgias.  Neurological:  Positive for dizziness and focal weakness (RUE weakness, right hand stiffness). Negative for loss of consciousness.  Psychiatric/Behavioral:  Negative for substance abuse.     EKGs/Labs/Other Studies Reviewed:    The following studies were reviewed today:  TTE 10/02/21 ([place]): SUMMARY  The left ventricular size is normal.  Left ventricular systolic function is mildly reduced.  LV ejection fraction = 45%.  There are regional wall motion abnormalities as specified below.  There is hypokinesis of the inferolateral and mid to distal  anterolateral/lateral wall.  The right ventricle is borderline dilated.  Right ventricular function cannot be assessed due to poor image  quality.  The aortic sinus is normal size.  The inferior vena cava was not visualized during the exam.  There is no pericardial effusion.  Compared to prior study, Impella CP device has been removed.   -  FINDINGS:  LEFT VENTRICLE  The left ventricular size is normal. There is normal left ventricular  wall thickness.  Left ventricular systolic function is mildly reduced.  LV ejection fraction = 45%. Left ventricular filling pattern is  indeterminate. There are regional wall motion abnormalities as  specified below. There is hypokinesis of the inferolateral and mid to  distal anterolateral/lateral wall.   -  RIGHT VENTRICLE  The right ventricle is borderline dilated. Right ventricular function  cannot be assessed due to poor image quality.   LEFT ATRIUM  The left atrial size is normal.   RIGHT ATRIUM  Right atrial size is normal.  -  AORTIC VALVE  Aortic valve calcification. There is no aortic stenosis. There is  trace aortic regurgitation.  -  MITRAL VALVE  The mitral valve leaflets appear normal. There is trace mitral  regurgitation.  -  TRICUSPID VALVE  Structurally normal tricuspid valve. There is trace tricuspid  regurgitation. There was insufficient TR detected to calculate RV  systolic pressure.  -  PULMONIC VALVE  Limited study, not performed.  -  ARTERIES  The aortic sinus is normal size.  -  VENOUS  The inferior vena cava was not visualized during the exam.  -  EFFUSION  There is no pericardial effusion.    TTE 09/28/21 ([place]): FINDINGS:  LEFT VENTRICLE  No LV thrombus seen.  There is an Impella LVAD in appropriate position approximately 3.2 cm  inside the AV. Left ventricular systolic function is moderate to  severely reduced. LV ejection fraction = 30-35%.    Cath 09/27/21 ([place]): PROCEDURE:  Left heart cath, Coronary angiography, IMA angiography, SVG angiography and PCI   DIAGNOSTIC FINDINGS:    1.  Subtotal occlusion of the ramus/high first diagonal coronary artery.    This is presumed to be the culprit vessel.  2.  Patent LIMA-LAD bypass graft.  3.  Patent SVG-PDA bypass graft.  4.  Patent radial bypass graft to OM2-OM 3 (jump graft)  5.  Chronically occluded vein graft to the right coronary artery system  (probably unnecessary  given patency of the native RCA)  6.  Normal LVEDP, 9 mmHg  7.  A right radial arterial monitoring line was placed.  Due to hypotension, the patient was started an infusion of phenylephrine.   PCI:    Successful angioplasty and stenting of the ramus/high diagonal coronary  artery with a 2.75 x 28 mm Xience drug-eluting stent.   COMPLICATIONS:  None   RECOMMENDATIONS:   Dual antiplatelet therapy for at least 12 months with no elective  interruptions for the first 3 months.   Consultation with a cardiac electrophysiology service for consideration  of a permanent implantable defibrillator.   Aggressive CVD event prevention with lifestyle changes and medical  therapy.   Will assess and replete electrolytes.   I will add amiodarone in hopes of preventing future VT/VF events.   Consultation with critical care service for management of the  ventilator/respiratory failure.   CT Cardiac Morph 12/22/20 IMPRESSION: 1. The left atrial appendage is a small chicken wing morphology without thrombus.   2. A 20 mm Watchman FLX device is recommended based on the average diameter of the landing zone (17.4 mm, 13% compression) due to is oval shape. The appendage is small and maximum diameter (13.2 mm) will likely preclude a 24 mm device. Would recommend discussion with the structural heart team.   3. There is no thrombus in the left atrial appendage.   4. A mid and mid IAS puncture site is recommended.   5. Optimal deployment angle: RAO 20 CRA 24   6. Normal coronary origin. Right dominance.   7. S/p CABG. A LIMA-LAD and graft-diagonal with jump to the OM are seen. The grafts to the PDA/PLV appear occluded. Native coronary assessment not performed due to lack of NTG and heavy calcification.  IMPRESSION: No acute or significant extracardiac abnormality.  Echo 10/25/20 1. Left ventricular ejection fraction, by estimation, is 60 to 65%. Left  ventricular ejection fraction by 3D  volume is 60 %. The left ventricle has  normal function. The left ventricle demonstrates regional wall motion  abnormalities (see scoring  diagram/findings for description). Left ventricular diastolic parameters  are consistent with Grade I diastolic dysfunction (impaired relaxation).  There is mild hypokinesis of the left ventricular, mid-apical anteroseptal  wall and anterior wall.   2. Abnormal RV free wall strain at -17%. Right ventricular systolic  function is normal. The right ventricular size is normal. There is normal  pulmonary artery systolic pressure. The estimated right ventricular  systolic pressure is 55.9 mmHg.   3. Normal LA strain.   4. The mitral valve is grossly normal. Trivial mitral valve  regurgitation.   5. The aortic valve is tricuspid. Aortic valve regurgitation is trivial.  Mild aortic valve sclerosis is present, with no evidence of aortic valve  stenosis.   6. The inferior vena cava is normal in size with <50% respiratory  variability, suggesting right atrial pressure of 8 mmHg.   Comparison(s): Changes from prior study are noted. 02/25/2020: LVEF 50-55%,  LAD territory hypokinesis, RVSP 49 mmHG.   CT Abdomen 08/12/20 IMPRESSION: VASCULAR   1. No evidence for active GI bleeding. 2. Mild atherosclerotic disease in the abdominal aorta. Aortic Atherosclerosis (ICD10-I70.0). 3. Main visceral arteries are patent.   NON-VASCULAR   1. No acute abnormalities in the abdomen or pelvis. 2. Again noted is a large left inguinal hernia containing a large portion of the sigmoid colon. No acute inflammatory changes associated with this hernia and no evidence for bowel obstruction. 3. Colonic diverticulosis. 4. Cholelithiasis. 5. Hepatic steatosis.  Cardiac Monitor 03/28/20: Patient monitoring period was from 03/28/20-04/26/20 Overall burden of Afib was <1% (1 hour  and 31 minutes in total). HR ranged in Afib from 93-157bpm Predominant rhythm was NSR with average HR  93bpm (range from 65bpm to 171bpm) Occasional ventricular ectopy (both PVCs and NSVT) with 3% burden; no sustained VT Rare SVE <1% No signficant pauses or sustained ventricular arrhythmias  Echocardiogram 02/25/2020: Impressions: 1. LVEF 50-55% with wall motion abnormalities suspicious for mid LAD. No  apical thrombus.   2. Left ventricular ejection fraction, by estimation, is 50 to 55%. The  left ventricle has low normal function. The left ventricle demonstrates  regional wall motion abnormalities (see scoring diagram/findings for  description). Left ventricular diastolic   parameters are consistent with Grade II diastolic dysfunction  (pseudonormalization). Elevated left atrial pressure.   3. Right ventricular systolic function is normal. The right ventricular  size is normal. There is moderately elevated pulmonary artery systolic  pressure. The estimated right ventricular systolic pressure is 78.6 mmHg.   4. Left atrial size was mildly dilated.   5. The mitral valve is normal in structure. Moderate mitral valve  regurgitation. No evidence of mitral stenosis.   6. The aortic valve is normal in structure. Aortic valve regurgitation is  not visualized. Mild to moderate aortic valve sclerosis/calcification is  present, without any evidence of aortic stenosis.   7. The inferior vena cava is normal in size with greater than 50%  respiratory variability, suggesting right atrial pressure of 3 mmHg.      Diagnostic cath 03/04/20 Dominance: Right       EKG:   EKG is personally reviewed. 01/18/22: SB, septal infarct pattern HR 55 12/18/2021:  Sinus rhythm. Rate 60 bpm. Septal infarct. 05/17/2020: NSR, septal q waves, isolated PVC, HR 92  Recent Labs: 03/28/2021: TSH 1.31 12/19/2021: BUN 13; Creatinine, Ser 1.34; Hemoglobin 11.7; Platelets 174; Potassium 4.6; Sodium 138   Recent Lipid Panel    Component Value Date/Time   CHOL 121 10/25/2020 0833   TRIG 204 (H) 10/25/2020 0833   HDL  28 (L) 10/25/2020 0833   CHOLHDL 4.3 10/25/2020 0833   CHOLHDL 4 07/26/2020 1144   VLDL 53.6 (H) 07/26/2020 1144   LDLCALC 59 10/25/2020 0833   LDLDIRECT 93.0 07/26/2020 1144     Risk Assessment/Calculations:    CHA2DS2-VASc Score = 6  This indicates a 9.7% annual risk of stroke. The patient's score is based upon: CHF History: 1 HTN History: 1 Diabetes History: 1 Stroke History: 2 Vascular Disease History: 1 Age Score: 0 Gender Score: 0    Physical Exam:    VS:  BP 136/80   Pulse (!) 55   Ht 5\' 8"  (1.727 m)   Wt 163 lb 3.2 oz (74 kg)   SpO2 95%   BMI 24.81 kg/m     Wt Readings from Last 3 Encounters:  01/18/22 163 lb 3.2 oz (74 kg)  12/18/21 160 lb 9.6 oz (72.8 kg)  05/24/21 173 lb 6.4 oz (78.7 kg)     GEN:  NAD, well appearing HEENT: Normal NECK: No JVD; No carotid bruits CARDIAC: RRR, 2/6 systolic murmur. No rubs or gallops RESPIRATORY:  Clear to auscultation bilaterally, no wheezes, rales, or rhonchi ABDOMEN: Soft, non-tender, non-distended MUSCULOSKELETAL:  No edema; No deformity  SKIN: Warm and dry NEUROLOGIC:  Alert and oriented x 3 PSYCHIATRIC:  Normal affect   ASSESSMENT:    1. Coronary artery disease involving coronary bypass graft of native heart without angina pectoris   2. PAF (paroxysmal atrial fibrillation) ([place])   3. Primary hypertension   4. Insulin-requiring or  dependent type II diabetes mellitus ([place])   5. H/O five vessel coronary artery bypass   6. Gastrointestinal hemorrhage, unspecified gastrointestinal hemorrhage type   7. Diabetes mellitus with coincident hypertension ([place])   8. Coronary artery disease of native artery of native heart with stable angina pectoris ([place])   9. Chronic systolic heart failure (HCC)     PLAN:    In order of problems listed above:  #Multivessel CAD #VT Arrest #NSTEMI: Patient presented to [facility] hospital on 02/2020 with hematochezia with course complicated by chest pain with VT arrest on 02/23/20  requiring shock and 2-9min of CPR with return of ROSC. High sensitivity troponin >15,000. EKG with Q waves in V1-V2 and as well as slight ST depression in V4-V6 and lead II. TTE with LVEF of 50-55% with hypokinesis of mid and distal anterior wall, mid and distal anterior septum, and apex as well as grade 2 diastolic dysfunction. Moderate MR and moderately elevated PASP of 48.7 mmHg. He was stabilized from GIB standpoint and underwent coronary angiography found to have multivessel CAD now s/p CABG on 03/08/20. With LIMA -> LAD, LRA -> Ramus -> OM, SVG -> PDA,  SVG -> PLB of RCA. Readmitted to [facility] with out of hospital arrest found to be in [place]. Received CPR with shocks x1 with ROSC. Cath with subtotal occlusion of ramus and D1. Bypass grafts patent. Course complicated by cardiogenic shock requiring pressor and impella placement. EF dropped to 10% but improved to 45% prior to discharge. Currently, doing much better without recurrence of anginal symptoms. -Continue plavix 75mg  daily -Continue atorvastatin 80mg  daily -Continue zetia 10mg  and vascepa 2g BID -Continue coreg 12.5mg  BID  #Chronic Systolic HF: EF dropped as low as 10% in the setting of cardiac arrest/cardiogenic shock as detailed above. Improved to 45% after revasscularization in 09/2021. Currently euvolemic and compensated on exam with NYHA class II symptoms.  -Start entresto 24/26mg  BID -Start farxiga 10mg  daily -Continue coreg 12.5mg  BID -Can add spiro at next visit  -Once on GDMT for 90 days, can repeat TTE for reassessment of EF -BMET next week -Low Na diet   #Paroxysmal Afib: CHADs-vasc 3. Cardiac monitor with episodes of sustained Afib (<1% burden) . LAA not amenable for Watchman. Now maintained on apixaban. -Stop amiodarone -Continue eliquis 5mg  BID -Continue coreg 12.5mg  BID -Deemed not to be a good watchman candidate due to unfavorable LAA morphology  #HTN: Elevated 130-150s. Will add entresto. -Start entresto 24/26mg   BID -Continue coreg 12.5mg  BID -Continue imdur 30mg  daily -BMET next week   #HLD: -Continue atorvastatin 80mg  daily -Continue zetia 10mg  daily -Continue vascepa 2g BID -Check lipids next week   #Recurrent GIB: Had initialy GIB during hospitalization in 02/2020 thought to be diverticular in nature. Recurrent GIB in 07/2020 as detailed above with relatively unrevealing work-up (? Diverticular vs gastric ulcer). -Continue PPI -High risk for re-bleed given need for apixaban and plavix   #DMII:  -Management per PCP -Check A1C for monitoring  #AKI on CKD: Developed acute renal failure during recent hospitalization requiring HD.Cr now significantly improved. Will monitor. -BMET next week  Follow up in 1 month.   Medication Adjustments/Labs and Tests Ordered: Current medicines are reviewed at length with the patient today.  Concerns regarding medicines are outlined above.   Orders Placed This Encounter  Procedures   Basic metabolic panel   Lipid panel   Hepatic function panel   HgB A1c   EKG 12-Lead   Meds ordered this encounter  Medications   dapagliflozin  propanediol (FARXIGA) 10 MG TABS tablet    Sig: Take 1 tablet (10 mg total) by mouth daily before breakfast.    Dispense:  90 tablet    Refill:  3   sacubitril-valsartan (ENTRESTO) 24-26 MG    Sig: Take 1 tablet by mouth 2 (two) times daily.    Dispense:  180 tablet    Refill:  3   Patient Instructions  Medication Instructions:  STOP AMIODARONE START ENTRESTO 24/26 TWICE DAILY  START FARXIGA 10 MG EVERY DAY  *If you need a refill on your cardiac medications before your next appointment, please call your pharmacy*   Lab Work: FASTING BMET LIPID AND LIVER  IN 1 WEEK  If you have labs (blood work) drawn today and your tests are completely normal, you will receive your results only by: [place] (if you have MyChart) OR A paper copy in the mail If you have any lab test that is abnormal or we need to change  your treatment, we will call you to review the results.   Testing/Procedures: NONE   Follow-Up: At [facility], you and your health needs are our priority.  As part of our continuing mission to provide you with exceptional heart care, we have created designated Provider Care Teams.  These Care Teams include your primary Cardiologist (physician) and Advanced Practice Providers (APPs -  Physician Assistants and Nurse Practitioners) who all work together to provide you with the care you need, when you need it.  We recommend signing up for the patient portal called "MyChart".  Sign up information is provided on this After Visit Summary.  MyChart is used to connect with patients for Virtual Visits (Telemedicine).  Patients are able to view lab/test results, encounter notes, upcoming appointments, etc.  Non-urgent messages can be sent to your provider as well.   To learn more about what you can do with MyChart, go to [email or web address].    Your next appointment:   3 month(s)  The format for your next appointment:   In Person  Provider:   Freada Bergeron, MD     Other Instructions NONE  Important Information About Sugar          Signed, Freada Bergeron, MD  01/18/2022 11:18 AM    [place]

## 2022-01-18 ENCOUNTER — Encounter: Payer: Self-pay | Admitting: Family

## 2022-01-18 ENCOUNTER — Other Ambulatory Visit: Payer: Self-pay | Admitting: *Deleted

## 2022-01-18 ENCOUNTER — Telehealth: Payer: Self-pay | Admitting: Internal Medicine

## 2022-01-18 ENCOUNTER — Ambulatory Visit: Payer: Managed Care, Other (non HMO) | Attending: Cardiology | Admitting: Cardiology

## 2022-01-18 VITALS — BP 136/80 | HR 55 | Ht 68.0 in | Wt 163.2 lb

## 2022-01-18 DIAGNOSIS — I5022 Chronic systolic (congestive) heart failure: Secondary | ICD-10-CM

## 2022-01-18 DIAGNOSIS — K922 Gastrointestinal hemorrhage, unspecified: Secondary | ICD-10-CM

## 2022-01-18 DIAGNOSIS — I25810 Atherosclerosis of coronary artery bypass graft(s) without angina pectoris: Secondary | ICD-10-CM

## 2022-01-18 DIAGNOSIS — M159 Polyosteoarthritis, unspecified: Secondary | ICD-10-CM

## 2022-01-18 DIAGNOSIS — E119 Type 2 diabetes mellitus without complications: Secondary | ICD-10-CM

## 2022-01-18 DIAGNOSIS — R000 Tachycardia, unspecified: Secondary | ICD-10-CM

## 2022-01-18 DIAGNOSIS — I10 Essential (primary) hypertension: Secondary | ICD-10-CM

## 2022-01-18 DIAGNOSIS — I25118 Atherosclerotic heart disease of native coronary artery with other forms of angina pectoris: Secondary | ICD-10-CM

## 2022-01-18 DIAGNOSIS — I480 Paroxysmal atrial fibrillation: Secondary | ICD-10-CM

## 2022-01-18 DIAGNOSIS — I469 Cardiac arrest, cause unspecified: Secondary | ICD-10-CM

## 2022-01-18 DIAGNOSIS — I639 Cerebral infarction, unspecified: Secondary | ICD-10-CM

## 2022-01-18 DIAGNOSIS — Z951 Presence of aortocoronary bypass graft: Secondary | ICD-10-CM

## 2022-01-18 DIAGNOSIS — Z794 Long term (current) use of insulin: Secondary | ICD-10-CM

## 2022-01-18 MED ORDER — DAPAGLIFLOZIN PROPANEDIOL 10 MG PO TABS: 10.0000 mg | ORAL_TABLET | Freq: Every day | ORAL | 3 refills | Status: DC

## 2022-01-18 MED ORDER — SACUBITRIL-VALSARTAN 24-26 MG PO TABS: 1.0000 | ORAL_TABLET | Freq: Two times a day (BID) | ORAL | 3 refills | Status: DC

## 2022-01-18 NOTE — Telephone Encounter (Signed)
Patient's daughter came in up front with information from [facility] - family is requesting that a family member be designated as a paid caregiver for patient. States that the paperwork gives instructions on how to get this approved through insurance. For clarification can call wife (on [place]) Christina at [phone].  Also states that they have lost patient's handicapped placard and are requesting new handicapped placard paper work.

## 2022-01-18 NOTE — Telephone Encounter (Signed)
PCS form has been redone as well as handicap placard and given to PCP to sign.

## 2022-01-18 NOTE — Patient Instructions (Addendum)
Medication Instructions:  STOP AMIODARONE START ENTRESTO 24/26 TWICE DAILY  START FARXIGA 10 MG EVERY DAY  *If you need a refill on your cardiac medications before your next appointment, please call your pharmacy*   Lab Work: FASTING BMET LIPID AND LIVER  IN 1 WEEK  If you have labs (blood work) drawn today and your tests are completely normal, you will receive your results only by: [place] (if you have MyChart) OR A paper copy in the mail If you have any lab test that is abnormal or we need to change your treatment, we will call you to review the results.   Testing/Procedures: NONE   Follow-Up: At [facility], you and your health needs are our priority.  As part of our continuing mission to provide you with exceptional heart care, we have created designated Provider Care Teams.  These Care Teams include your primary Cardiologist (physician) and Advanced Practice Providers (APPs -  Physician Assistants and Nurse Practitioners) who all work together to provide you with the care you need, when you need it.  We recommend signing up for the patient portal called "MyChart".  Sign up information is provided on this After Visit Summary.  MyChart is used to connect with patients for Virtual Visits (Telemedicine).  Patients are able to view lab/test results, encounter notes, upcoming appointments, etc.  Non-urgent messages can be sent to your provider as well.   To learn more about what you can do with MyChart, go to [email or web address].    Your next appointment:   3 month(s)  The format for your next appointment:   In Person  Provider:   Freada Bergeron, MD     Other Instructions NONE  Important Information About Sugar

## 2022-01-18 NOTE — Telephone Encounter (Signed)
Paperwork from [employer] placed in [place] box up front.

## 2022-01-22 NOTE — Telephone Encounter (Signed)
PCS and DMV form has been signed  Copy sent to charge Original filed with [place]  Pt's wife [name] informed.  Ready for pick up at the front desk.

## 2022-01-24 ENCOUNTER — Encounter: Payer: Self-pay | Admitting: Cardiology

## 2022-01-25 ENCOUNTER — Ambulatory Visit: Payer: Managed Care, Other (non HMO) | Attending: Cardiology

## 2022-01-25 DIAGNOSIS — I25810 Atherosclerosis of coronary artery bypass graft(s) without angina pectoris: Secondary | ICD-10-CM

## 2022-01-25 DIAGNOSIS — Z794 Long term (current) use of insulin: Secondary | ICD-10-CM

## 2022-01-25 DIAGNOSIS — I10 Essential (primary) hypertension: Secondary | ICD-10-CM

## 2022-01-25 DIAGNOSIS — I480 Paroxysmal atrial fibrillation: Secondary | ICD-10-CM

## 2022-01-25 LAB — BASIC METABOLIC PANEL
BUN/Creatinine Ratio: 16 (ref 9–20)
BUN: 28 mg/dL — ABNORMAL HIGH (ref 6–24)
CO2: 23 mmol/L (ref 20–29)
Calcium: 9.8 mg/dL (ref 8.7–10.2)
Chloride: 100 mmol/L (ref 96–106)
Creatinine, Ser: 1.75 mg/dL — ABNORMAL HIGH (ref 0.76–1.27)
Glucose: 174 mg/dL — ABNORMAL HIGH (ref 70–99)
Potassium: 4.8 mmol/L (ref 3.5–5.2)
Sodium: 137 mmol/L (ref 134–144)
eGFR: 44 mL/min/{1.73_m2} — ABNORMAL LOW (ref >59)

## 2022-01-25 LAB — HEMOGLOBIN A1C
Est. average glucose Bld gHb Est-mCnc: 160 mg/dL
Hgb A1c MFr Bld: 7.2 % — ABNORMAL HIGH (ref 4.8–5.6)

## 2022-01-25 LAB — LIPID PANEL
Chol/HDL Ratio: 2.5 ratio (ref 0.0–5.0)
Cholesterol, Total: 70 mg/dL — ABNORMAL LOW (ref 100–199)
HDL: 28 mg/dL — ABNORMAL LOW (ref >39)
LDL Chol Calc (NIH): 27 mg/dL (ref 0–99)
Triglycerides: 61 mg/dL (ref 0–149)
VLDL Cholesterol Cal: 15 mg/dL (ref 5–40)

## 2022-01-25 LAB — HEPATIC FUNCTION PANEL
ALT: 129 IU/L — ABNORMAL HIGH (ref 0–44)
AST: 60 IU/L — ABNORMAL HIGH (ref 0–40)
Albumin: 4.6 g/dL (ref 3.8–4.9)
Alkaline Phosphatase: 146 IU/L — ABNORMAL HIGH (ref 44–121)
Bilirubin Total: 0.7 mg/dL (ref 0.0–1.2)
Bilirubin, Direct: 0.27 mg/dL (ref 0.00–0.40)
Total Protein: 8.2 g/dL (ref 6.0–8.5)

## 2022-01-26 ENCOUNTER — Telehealth: Payer: Self-pay | Admitting: *Deleted

## 2022-01-26 NOTE — Telephone Encounter (Signed)
-----   Message from Freada Bergeron, MD sent at 01/26/2022  9:00 AM EST ----- Labs reviewed with our pharmacist as well.  A1C is 7.2 which has increased some but still doing pretty well. The farxiga will help with this.  His cholesterol is excellent but LFTs are up. This may be related to the high dose lipitor. Is he taking tylenol or drinking at all (I think unlikely). If not, discussed with pharmacy and she recommends changing to crestor 20mg  daily (less apt to cause LFT elevation) with repeat LFTs in 3 weeks. If still up, will start PCSK9i.  His Cr went up the expected amount with initiation of farxiga and entresto. This does not mean that his renal function is worse. We expect this rise with these meds. We will ensure this stays table with a CMET in 3 weeks.

## 2022-01-26 NOTE — Telephone Encounter (Signed)
Tried calling the pt multiple times to endorse lab results and plan per Dr. Johney Frame.  He did not answer.  Did leave him voice messages to call the office back.  Will send him a mychart message to call the office on Monday, to go over results and recommendations.  Pt is very hard to get in touch with over the phone.

## 2022-01-29 ENCOUNTER — Telehealth: Payer: Self-pay | Admitting: *Deleted

## 2022-01-29 DIAGNOSIS — I25118 Atherosclerotic heart disease of native coronary artery with other forms of angina pectoris: Secondary | ICD-10-CM

## 2022-01-29 DIAGNOSIS — I2510 Atherosclerotic heart disease of native coronary artery without angina pectoris: Secondary | ICD-10-CM

## 2022-01-29 DIAGNOSIS — Z79899 Other long term (current) drug therapy: Secondary | ICD-10-CM

## 2022-01-29 DIAGNOSIS — Z951 Presence of aortocoronary bypass graft: Secondary | ICD-10-CM

## 2022-01-29 DIAGNOSIS — I25810 Atherosclerosis of coronary artery bypass graft(s) without angina pectoris: Secondary | ICD-10-CM

## 2022-01-29 DIAGNOSIS — R7989 Other specified abnormal findings of blood chemistry: Secondary | ICD-10-CM

## 2022-01-29 DIAGNOSIS — E782 Mixed hyperlipidemia: Secondary | ICD-10-CM

## 2022-01-29 MED ORDER — ROSUVASTATIN CALCIUM 20 MG PO TABS: 20.0000 mg | ORAL_TABLET | Freq: Every day | ORAL | 1 refills | Status: DC

## 2022-01-29 NOTE — Telephone Encounter (Signed)
-----   Message from Freada Bergeron, MD sent at 01/26/2022  9:00 AM EST ----- Labs reviewed with our pharmacist as well.  A1C is 7.2 which has increased some but still doing pretty well. The farxiga will help with this.  His cholesterol is excellent but LFTs are up. This may be related to the high dose lipitor. Is he taking tylenol or drinking at all (I think unlikely). If not, discussed with pharmacy and she recommends changing to crestor 20mg  daily (less apt to cause LFT elevation) with repeat LFTs in 3 weeks. If still up, will start PCSK9i.  His Cr went up the expected amount with initiation of farxiga and entresto. This does not mean that his renal function is worse. We expect this rise with these meds. We will ensure this stays table with a CMET in 3 weeks.

## 2022-01-29 NOTE — Telephone Encounter (Signed)
The patient has been notified of the result and verbalized understanding.  All questions (if any) were answered.  Pt states he is NOT taking any tylenol only tramadol as needed for pain.  He did say he is drinking occasionally.  Confirmed how much is occasionally and he replied that "they give me beer time to time."  He states that would only be a few times a month and not daily and not frequently during the week.  Advised the pt that he needs to refrain from any alcohol consumption at this time and we have to ensure his LFTs trend back down in 3 weeks with a repeat cmet.  Advised the pt to stop taking atorvastatin and we will send in for him to start taking crestor 20 mg po daily instead and repeat a CMET on him in 3 weeks on this new regimen and without any alcohol between now and then.  Confirmed the pharmacy of choice with the pt.   Scheduled the pt for repeat CMET in 3 weeks on 02/20/22.   Pt aware that without any alcohol and changing his statin to crestor, if in 3 weeks his LFTs are still elevated at that time, then we will refer him to lipid clinic for consideration of PCSK9-Inhibitors.    Pt verbalized understanding and agrees with this plan.  Will make Dr. Johney Frame aware of this message.

## 2022-02-20 ENCOUNTER — Ambulatory Visit: Payer: Managed Care, Other (non HMO) | Attending: Cardiology

## 2022-02-20 DIAGNOSIS — E782 Mixed hyperlipidemia: Secondary | ICD-10-CM

## 2022-02-20 DIAGNOSIS — Z79899 Other long term (current) drug therapy: Secondary | ICD-10-CM

## 2022-02-20 DIAGNOSIS — Z951 Presence of aortocoronary bypass graft: Secondary | ICD-10-CM

## 2022-02-20 DIAGNOSIS — I25118 Atherosclerotic heart disease of native coronary artery with other forms of angina pectoris: Secondary | ICD-10-CM

## 2022-02-20 DIAGNOSIS — I2510 Atherosclerotic heart disease of native coronary artery without angina pectoris: Secondary | ICD-10-CM

## 2022-02-20 DIAGNOSIS — R7989 Other specified abnormal findings of blood chemistry: Secondary | ICD-10-CM

## 2022-02-20 DIAGNOSIS — I25810 Atherosclerosis of coronary artery bypass graft(s) without angina pectoris: Secondary | ICD-10-CM

## 2022-02-20 LAB — COMPREHENSIVE METABOLIC PANEL
ALT: 293 IU/L — ABNORMAL HIGH (ref 0–44)
AST: 216 IU/L — ABNORMAL HIGH (ref 0–40)
Albumin/Globulin Ratio: 1.6 (ref 1.2–2.2)
Albumin: 4.6 g/dL (ref 3.8–4.9)
Alkaline Phosphatase: 119 IU/L (ref 44–121)
BUN/Creatinine Ratio: 15 (ref 9–20)
BUN: 25 mg/dL — ABNORMAL HIGH (ref 6–24)
Bilirubin Total: 0.6 mg/dL (ref 0.0–1.2)
CO2: 21 mmol/L (ref 20–29)
Calcium: 9.8 mg/dL (ref 8.7–10.2)
Chloride: 105 mmol/L (ref 96–106)
Creatinine, Ser: 1.72 mg/dL — ABNORMAL HIGH (ref 0.76–1.27)
Globulin, Total: 2.9 g/dL (ref 1.5–4.5)
Glucose: 211 mg/dL — ABNORMAL HIGH (ref 70–99)
Potassium: 5.2 mmol/L (ref 3.5–5.2)
Sodium: 139 mmol/L (ref 134–144)
Total Protein: 7.5 g/dL (ref 6.0–8.5)
eGFR: 45 mL/min/{1.73_m2} — ABNORMAL LOW (ref >59)

## 2022-02-21 ENCOUNTER — Telehealth: Payer: Self-pay | Admitting: *Deleted

## 2022-02-21 DIAGNOSIS — E785 Hyperlipidemia, unspecified: Secondary | ICD-10-CM

## 2022-02-21 DIAGNOSIS — I214 Non-ST elevation (NSTEMI) myocardial infarction: Secondary | ICD-10-CM

## 2022-02-21 DIAGNOSIS — R7989 Other specified abnormal findings of blood chemistry: Secondary | ICD-10-CM

## 2022-02-21 DIAGNOSIS — I2510 Atherosclerotic heart disease of native coronary artery without angina pectoris: Secondary | ICD-10-CM

## 2022-02-21 DIAGNOSIS — Z79899 Other long term (current) drug therapy: Secondary | ICD-10-CM

## 2022-02-21 DIAGNOSIS — I25118 Atherosclerotic heart disease of native coronary artery with other forms of angina pectoris: Secondary | ICD-10-CM

## 2022-02-21 DIAGNOSIS — E782 Mixed hyperlipidemia: Secondary | ICD-10-CM

## 2022-02-21 DIAGNOSIS — I25810 Atherosclerosis of coronary artery bypass graft(s) without angina pectoris: Secondary | ICD-10-CM

## 2022-02-21 DIAGNOSIS — Z951 Presence of aortocoronary bypass graft: Secondary | ICD-10-CM

## 2022-02-21 DIAGNOSIS — Z789 Other specified health status: Secondary | ICD-10-CM

## 2022-02-21 NOTE — Telephone Encounter (Signed)
The patient has been notified of the result and verbalized understanding.  All questions (if any) were answered.  Pt aware that per Dr. Johney Frame, we will stop statins all together and refer him to our lipid clinic for consideration of PCSK9-Inhibitors, due to significant elevation in LFTs while being on a statins.    Pt aware to stop crestor now.   He is aware that I will place the referral to lipid clinic in the system and send a message to our [facility] Schedulers to call him back and arrange this appt.   Updated all statins the pt has taken in his allergies as an intolerance with significant elevated LFTs while taking these.   Pt verbalized understanding and agrees with this plan.

## 2022-02-21 NOTE — Telephone Encounter (Signed)
-----   Message from Freada Bergeron, MD sent at 02/20/2022  7:34 PM EST ----- AST/ALT rising. Need to stop statins completely and will refer to lipid clinic. Kidney function and electrolytes otherwise look stable.

## 2022-03-29 ENCOUNTER — Encounter: Payer: Self-pay | Admitting: Family

## 2022-04-02 ENCOUNTER — Ambulatory Visit: Payer: Managed Care, Other (non HMO) | Attending: Internal Medicine | Admitting: Pharmacist

## 2022-04-02 DIAGNOSIS — E785 Hyperlipidemia, unspecified: Secondary | ICD-10-CM

## 2022-04-02 DIAGNOSIS — I25810 Atherosclerosis of coronary artery bypass graft(s) without angina pectoris: Secondary | ICD-10-CM

## 2022-04-02 LAB — LIPID PANEL
Chol/HDL Ratio: 6 ratio — ABNORMAL HIGH (ref 0.0–5.0)
Cholesterol, Total: 180 mg/dL (ref 100–199)
HDL: 30 mg/dL — ABNORMAL LOW (ref >39)
LDL Chol Calc (NIH): 116 mg/dL — ABNORMAL HIGH (ref 0–99)
Triglycerides: 191 mg/dL — ABNORMAL HIGH (ref 0–149)
VLDL Cholesterol Cal: 34 mg/dL (ref 5–40)

## 2022-04-02 LAB — HEPATIC FUNCTION PANEL
ALT: 28 IU/L (ref 0–44)
AST: 19 IU/L (ref 0–40)
Albumin: 4.4 g/dL (ref 3.8–4.9)
Alkaline Phosphatase: 74 IU/L (ref 44–121)
Bilirubin Total: 0.5 mg/dL (ref 0.0–1.2)
Bilirubin, Direct: 0.16 mg/dL (ref 0.00–0.40)
Total Protein: 7.7 g/dL (ref 6.0–8.5)

## 2022-04-02 NOTE — Progress Notes (Addendum)
Patient ID: Colton Guzman.                 DOB: 10-24-1960                    MRN: [id number]      HPI: Colton Guzman. is a 62 y.o. male patient referred to lipid clinic by  Dr. Shari Prows. 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 Found to have afib. Patient cardiac arrested again 09/2021. Cath 09/27/21 with DES to D1. EF found to be 10% and impella placed. Course was further complicated by cardiogenic shock, renal failure requiring HD, embolic stroke, and anemia from blood loss at impella site requiring multiple transfusions.   He was previously on atorvastatin  daily but ALT bumped to 129. Atorvastatin  daily was stopped and rosuvastatin  daily was started. Repeat LFT however were elevated, ALT 293, AST 216. Rosuvastatin was stopped and patient referred to lipid clinic.  Patient presents today to clinic.  He reports that he is no longer taking rosuvastatin.  He has stopped using Tylenol and drinks very minimally.  He is still taking ezetimibe and Vascepa.  Walking about 25 minutes in his home daily. Plans to walk outside in the spring.  He does walk with a cane and has some pain in his foot.   Reviewed options for lowering LDL cholesterol, including ezetimibe, PCSK-9 inhibitors, bempedoic acid and inclisiran.  Discussed mechanisms of action, dosing, side effects and potential decreases in LDL cholesterol.  Also reviewed cost information and potential options for patient assistance.   Current Medications: ezetimibe  daily, Vascepa 2g BID Intolerances: Rosuvastatin, atorvastatin (increased LFT) Risk Factors: CABG, progressive disease DES to D1, HTN, DM LDL-C goal: <55 ApoB goal: <70  Diet: vegetables, chicken, fish, little bit of steak, fruit Drink: water,  sometimes juice, coffee (decaf, creamer, no sugar). Zero sugar soda  Exercise: walks everyday  Family History: No family history on file.  Social History: former smoker, beer/wine occasionally few times a week- 1 drink  Labs: Lipid Panel     Component Value Date/Time   CHOL 88 (L) 06/11/2022 0830   TRIG 175 (H) 06/11/2022 0830   HDL 31 (L) 06/11/2022 0830   CHOLHDL 2.8 06/11/2022 0830   CHOLHDL 4 07/26/2020 1144   VLDL 53.6 (H) 07/26/2020 1144   LDLCALC 28 06/11/2022 0830   LDLDIRECT 93.0 07/26/2020 1144   LABVLDL 29 06/11/2022 0830    Past Medical History:  Diagnosis Date   Anemia    hx iron transfusion   Arthritis    left shoulder   CAD (coronary artery disease) of artery bypass graft 02/2020   Diabetes mellitus without complication (HCC)    type 2   Elevated blood uric acid level    GI bleed    History of blood transfusion 08/19/2020   Hyperlipidemia LDL goal <70    Hypertension    PAF (paroxysmal atrial fibrillation) (HCC)    Wears partial dentures    upper dent/lower partial    Current Outpatient Medications on File Prior to Visit  Medication Sig Dispense Refill   allopurinol (ZYLOPRIM) 100 MG tablet TAKE 1 TABLET BY MOUTH EVERY DAY 90 tablet 0   apixaban (ELIQUIS) 5 MG TABS tablet Take 1 tablet (5 mg total) by mouth  2 (two) times daily. 60 tablet 6   ASPIRIN LOW DOSE 81 MG EC tablet Take 81 mg by mouth every 6 (six) hours as needed for moderate pain.     carvedilol (COREG) 12.5 MG tablet Take 1 tablet (12.5 mg total) by mouth 2 (two) times daily. 180 tablet 2   clopidogrel (PLAVIX) 75 MG tablet Take 1 tablet (75 mg total) by mouth daily. 90 tablet 3   ezetimibe (ZETIA) 10 MG tablet Take 1 tablet (10 mg total) by mouth daily. 90 tablet 3   Fe Bisgly-Vit C-Vit B12-FA (GENTLE IRON) 28-60-0.008-0.4 MG CAPS Take 1 tablet by mouth in the morning.     icosapent Ethyl (VASCEPA) 1 g capsule Take 2 capsules (2 g total) by mouth 2 (two) times daily. 360 capsule 3    isosorbide mononitrate (IMDUR) 30 MG 24 hr tablet Take 1 tablet (30 mg total) by mouth daily. 90 tablet 2   Magnesium 300 MG CAPS Take 300 mg by mouth daily.     Multiple Vitamin (MULTIVITAMIN WITH MINERALS) TABS tablet Take 1 tablet by mouth daily.     pantoprazole (PROTONIX) 40 MG tablet Please take 40 mg oral twice daily x30 days, then 40 mg oral daily. 90 tablet 0   vitamin B-12 (CYANOCOBALAMIN) 1000 MCG tablet Take 1,000 mcg by mouth in the morning.     No current facility-administered medications on file prior to visit.    Allergies  Allergen Reactions   Atorvastatin Other (See Comments)    Significant elevation in LFTs while on statins   Crestor [Rosuvastatin] Other (See Comments)    Significant elevation in LFTs while on statins   Metformin And Related Other (See Comments)   Propofol Other (See Comments)    Encephalopathy    Assessment/Plan:  1. Hyperlipidemia -  Dyslipidemia, goal LDL below 70 Assessment: LDL-C was at goal on high-dose statin however LFTs increased greater than 3 times the upper limit of normal and had to be stopped No improvement in LFTs, in fact that worsens, on rosuvastatin Patient is interested in cardiac rehab.  He states that he did some physical therapy prior but his insurance would only pay for a few visits, hoping they will pay for more now Has not used Tylenol since the increase in his LFTs. very minimal alcohol use  Plan: Will recheck LFTs today to make sure that normalized Also check a lipid panel in order to submit to insurance Once labs are back we will submit prior authorization for PCSK9 Patient was educated on side effects and injection technique for Repatha Repeat labs in 2 to 3 months.    Thank you,  Olene Floss, Pharm.D, BCPS, CPP [facility] A Division of [facility] [address] [place], [place] [address]  Phone: [phone]; Fax: [phone]

## 2022-04-02 NOTE — Patient Instructions (Signed)
I will submit a prior authorization for Repatha. I will call you once I hear back. Please call me at [phone] with any questions.   Repatha is a cholesterol medication that improved your body's ability to get rid of "bad cholesterol" known as LDL. It can lower your LDL up to 60%! It is an injection that is given under the skin every 2 weeks. The medication often requires a prior authorization from your insurance company. We will take care of submitting all the necessary information to your insurance company to get it approved. The most common side effects of Repatha include runny nose, symptoms of the common cold, rarely flu or flu-like symptoms, back/muscle pain in about 3-4% of the patients, and redness, pain, or bruising at the injection site. Tell your healthcare provider if you have any side effect that bothers you or that does not go away.

## 2022-04-02 NOTE — Assessment & Plan Note (Signed)
Assessment: LDL-C was at goal on high-dose statin however LFTs increased greater than 3 times the upper limit of normal and had to be stopped No improvement in LFTs, in fact that worsens, on rosuvastatin Patient is interested in cardiac rehab.  He states that he did some physical therapy prior but his insurance would only pay for a few visits, hoping they will pay for more now Has not used Tylenol since the increase in his LFTs. very minimal alcohol use  Plan: Will recheck LFTs today to make sure that normalized Also check a lipid panel in order to submit to insurance Once labs are back we will submit prior authorization for PCSK9 Patient was educated on side effects and injection technique for Repatha Repeat labs in 2 to 3 months.

## 2022-04-03 ENCOUNTER — Telehealth: Payer: Self-pay | Admitting: Cardiology

## 2022-04-03 ENCOUNTER — Telehealth: Payer: Self-pay | Admitting: Pharmacist

## 2022-04-03 DIAGNOSIS — E785 Hyperlipidemia, unspecified: Secondary | ICD-10-CM

## 2022-04-03 MED ORDER — REPATHA SURECLICK 140 MG/ML ~~LOC~~ SOAJ
1.0000 mL | SUBCUTANEOUS | 11 refills | Status: DC
  Filled 2022-07-20 – 2022-08-31 (×4): qty 2, 28d supply, fill #0
  Filled 2022-10-02: qty 2, 28d supply, fill #1
  Filled 2022-10-23: qty 2, 28d supply, fill #2
  Filled 2022-11-19: qty 2, 28d supply, fill #3
  Filled 2022-12-25: qty 2, 28d supply, fill #4
  Filled 2023-01-12 – 2023-01-15 (×2): qty 2, 28d supply, fill #5

## 2022-04-03 NOTE — Telephone Encounter (Signed)
Pt spouse called in today very concerned about pt's recent lab results. She states she is concerned that pt could have another heart attack. She would like to speak to RN or Dr. Johney Frame for recommendations. Please advise.

## 2022-04-03 NOTE — Telephone Encounter (Signed)
PA for Repatha submitted Key: [id number] Approved Called pt and reviewed labs and approval Labs scheduled for 4/22.

## 2022-04-10 NOTE — Progress Notes (Signed)
Cardiology Office Note:    Date:  04/13/2022   ID:  Colton Neptune Arison Beecroft., DOB 08-19-1960, MRN [id number]  PCP:  [name], MD   [place]  Cardiologist:  Freada Bergeron, MD  Advanced Practice Provider:  No care team member to display Electrophysiologist:  Vickie Epley, MD   Referring MD: [name], MD    History of Present Illness:    Colton Neptune Hulett Golan. is a 62 y.o. male with a hx of DMII, HLD and HTN and recent prolonged admission at [facility] hospital where he presented with hematochezia with course complicated by chest pain/VT arrest requiring CPR later found to have multivessel CAD s/p CABG on 03/08/20 who now presents to clinic for follow-up.   Patient was hospitalized at [facility] from 02/22/20-03/13/20 where he presented with hematochezia from diverticular source. On 02/23/20, the patient developed chest pain in the setting of a blood bowel movement that initially responded to SL NTG but then recurred with subsequent VT arrest requiring 2-39mn CPR and shock. Post-shock rhythm Afib. Trop [place] at [id number]. He was stabilized from GIB standpoint and underwent coronary angiography on 03/04/20 which showed 70% stenosis of the proximal RCA with 50% stenosis of the distal RCA, 70% stenosis of the right PDA, 99% stenosis of the third right PL, 99% stenosis of the second right PL, 90% stenosis of the mid LAD with an ostial LAD stenosis of 70%, 100% stenosis of the ostial to proximal circumflex, and 60% stenosis of the mid LM to ostial LAD.  Due to his severe proximal LAD stenosis and severe mid LAD stenosis coronary bypass grafting was recommended. He underwent successful CABG x5 on 03/08/20 with LIMA to distal LAD, SVG to PDA., SVG tp posterior lateral branch of the right coronary artery, left radial artery to the ramus intermediate and first obtuse marginal coronary arteries.  Following the procedure, he separated from cardiopulmonary bypass without  difficulty. He did well and was successfully discharged home on 03/13/20.  Seen by me on 03/28/20 where he was doing well. Obtained cardiac monitor which showed <1% burden of Afib, 3% burden of PVCs/NSVT. Given Afib outside of the post-operative setting, his plavix was stopped and [facility]   [facility]. During admission, he required 6units pRBCs total. His ASA and apixaban were initially held. Colonoscopy revealed severe diverticulosis but no active bleeding. EGD with nonbleeding gastric ulcers. Tagged RBC scan was also negative. Patient overall improved and H/H remained stable with conservative management with PPI. He was resumed on his apixaban which he tolerated and was discharged home.  He was evaluated by Dr. LQuentin Orefor consideration of Watchman however, prewatchman implant CT scan which showed very difficult anatomy with a very shallow landing zone for the watchman device. After three-dimensional reconstruction of the appendage, he was deemed a poor Watchman candidate. May be considered for amulet in the future.   [facility] witnessed cardiac arrest. Initial rhythm was reportedly Vfib. Had 1 round of CPR with 1 shock with successful ROSC. Was taken for cath on 09/27/21 where he underwent DES to D1. Bedside ultrasound demonstrated severely reduced EF 10% and he was subsequently taken from impella placement. Course was further complicated by cardiogenic shock, renal failure requiring HD, embolic stroke, and anemia from blood loss at impella site requiring multiple transfusions. Notably, he was maintained on apixaban, ASA and ticagrelor.   Seen  in clinic on 12/18/21 where he was much better. We resumed his plavix. We made several medication changes at that time. Renal function had significantly improved with Cr 3.2>1.34.  Seen in clinic on 12/2021. He was doing well at that visit. We stopped  amiodarone and added entresto at that time.   Today, the patient feels well today. No chest pain, SOB, orthopnea,  PND, lightheadedness, dizziness or syncope. Has occasional palpitations after having too much caffeine. Blood pressure mainly in 120-130s at home. Tolerating medications as prescribed. Continues to ambulate with a cane when he leaves the house. No exertional symptoms with movement.   Past Medical History:  Diagnosis Date   Anemia    hx iron transfusion   Arthritis    left shoulder   CAD (coronary artery disease) of artery bypass graft 02/2020   Diabetes mellitus without complication (HCC)    type 2   Elevated blood uric acid level    GI bleed    History of blood transfusion 08/19/2020   Hyperlipidemia LDL goal <70    Hypertension    PAF (paroxysmal atrial fibrillation) ([place])    Wears partial dentures    upper dent/lower partial    Past Surgical History:  Procedure Laterality Date   BIOPSY  08/17/2020   Procedure: BIOPSY;  Surgeon: Mauri Pole, MD;  Location: [place] ENDOSCOPY;  Service: Endoscopy;;   COLONOSCOPY WITH PROPOFOL Left 02/27/2020   Procedure: COLONOSCOPY WITH PROPOFOL;  Surgeon: Otis Brace, MD;  Location: [facility] ENDOSCOPY;  Service: Gastroenterology;  Laterality: Left;   COLONOSCOPY WITH PROPOFOL N/A 08/13/2020   Procedure: COLONOSCOPY WITH PROPOFOL;  Surgeon: Gatha Mayer, MD;  Location: [facility] ENDOSCOPY;  Service: Endoscopy;  Laterality: N/A;   CORONARY ARTERY BYPASS GRAFT N/A 03/08/2020   Procedure: CORONARY ARTERY BYPASS GRAFTING (CABG) x 5 ON PUMP;  Surgeon: Wonda Olds, MD;  Location: [facility] OR;  Service: Open Heart Surgery;  Laterality: N/A;   ENDOVEIN HARVEST OF GREATER SAPHENOUS VEIN Right 03/08/2020   Procedure: ENDOVEIN HARVEST OF GREATER SAPHENOUS VEIN;  Surgeon: Wonda Olds, MD;  Location: [place];  Service: Open Heart Surgery;  Laterality: Right;   ESOPHAGOGASTRODUODENOSCOPY (EGD) WITH PROPOFOL N/A 02/27/2020   Procedure:  ESOPHAGOGASTRODUODENOSCOPY (EGD) WITH PROPOFOL;  Surgeon: Otis Brace, MD;  Location: [facility];  Service: Gastroenterology;  Laterality: N/A;   ESOPHAGOGASTRODUODENOSCOPY (EGD) WITH PROPOFOL N/A 08/17/2020   Procedure: ESOPHAGOGASTRODUODENOSCOPY (EGD) WITH PROPOFOL;  Surgeon: Mauri Pole, MD;  Location: [place] ENDOSCOPY;  Service: Endoscopy;  Laterality: N/A;   INSERTION OF MESH Left 05/09/2021   Procedure: INSERTION OF MESH;  Surgeon: Ralene Ok, MD;  Location: [place];  Service: General;  Laterality: Left;   LEFT HEART CATH AND CORONARY ANGIOGRAPHY N/A 03/04/2020   Procedure: LEFT HEART CATH AND CORONARY ANGIOGRAPHY;  Surgeon: Burnell Blanks, MD;  Location: [place] CV LAB;  Service: Cardiovascular;  Laterality: N/A;   RADIAL ARTERY HARVEST Left 03/08/2020   Procedure: RADIAL ARTERY HARVEST;  Surgeon: Wonda Olds, MD;  Location: [place];  Service: Open Heart Surgery;  Laterality: Left;   TEE WITHOUT CARDIOVERSION N/A 03/08/2020   Procedure: TRANSESOPHAGEAL ECHOCARDIOGRAM (TEE);  Surgeon: Wonda Olds, MD;  Location: [place];  Service: Open Heart Surgery;  Laterality: N/A;   XI ROBOTIC ASSISTED INGUINAL HERNIA REPAIR WITH MESH Left 05/09/2021   Procedure: XI ROBOTIC ASSISTED LEFT INGUINAL HERNIA REPAIR WITH MESH;  Surgeon: Ralene Ok, MD;  Location: [place];  Service: General;  Laterality: Left;    Current  Medications: Current Meds  Medication Sig   allopurinol (ZYLOPRIM) 100 MG tablet TAKE 1 TABLET BY MOUTH EVERY DAY   apixaban (ELIQUIS) 5 MG TABS tablet Take 1 tablet (5 mg total) by mouth 2 (two) times daily.   ASPIRIN LOW DOSE 81 MG EC tablet Take 81 mg by mouth every 6 (six) hours as needed for moderate pain.   carvedilol (COREG) 12.5 MG tablet Take 1 tablet (12.5 mg total) by mouth 2 (two) times daily.   clopidogrel (PLAVIX) 75 MG tablet Take 1 tablet (75 mg total) by mouth daily.   Evolocumab (REPATHA SURECLICK) [id number] MG/ML SOAJ Inject 140 mg into the  skin every 14 (fourteen) days.   ezetimibe (ZETIA) 10 MG tablet Take 1 tablet (10 mg total) by mouth daily.   Fe Bisgly-Vit C-Vit B12-FA (GENTLE IRON) 28-60-0.008-0.4 MG CAPS Take 1 tablet by mouth in the morning.   icosapent Ethyl (VASCEPA) 1 g capsule Take 2 capsules (2 g total) by mouth 2 (two) times daily.   isosorbide mononitrate (IMDUR) 30 MG 24 hr tablet Take 1 tablet (30 mg total) by mouth daily.   Magnesium 300 MG CAPS Take 300 mg by mouth daily.   Multiple Vitamin (MULTIVITAMIN WITH MINERALS) TABS tablet Take 1 tablet by mouth daily.   pantoprazole (PROTONIX) 40 MG tablet Please take 40 mg oral twice daily x30 days, then 40 mg oral daily.   traMADol (ULTRAM) 50 MG tablet Take 1 tablet (50 mg total) by mouth every 12 (twelve) hours as needed.   vitamin B-12 (CYANOCOBALAMIN) 1000 MCG tablet Take 1,000 mcg by mouth in the morning.   [DISCONTINUED] dapagliflozin propanediol (FARXIGA) 10 MG TABS tablet Take 1 tablet (10 mg total) by mouth daily before breakfast.   [DISCONTINUED] sacubitril-valsartan (ENTRESTO) 24-26 MG Take 1 tablet by mouth 2 (two) times daily.   spironolactone (ALDACTONE) 25 MG tablet Take 0.5 tablets (12.5 mg total) by mouth daily.     Allergies:   Atorvastatin, Crestor [rosuvastatin], and Propofol   Social History   Socioeconomic History   Marital status: Married    Spouse name: Not on file   Number of children: Not on file   Years of education: Not on file   Highest education level: Not on file  Occupational History   Not on file  Tobacco Use   Smoking status: Former    Packs/day: 1.50    Types: Cigarettes    Quit date: 2012    Years since quitting: 12.1   Smokeless tobacco: Never  Vaping Use   Vaping Use: Former   Quit date: 08/28/2010  Substance and Sexual Activity   Alcohol use: Not Currently    Comment: stopped drinking 02/2020   Drug use: Yes    Types: Marijuana    Comment: over 30 yrs ago as of 05/03/21   Sexual activity: Not Currently  Other  Topics Concern   Not on file  Social History Narrative   Not on file   Social Determinants of Health   Financial Resource Strain: Not on file  Food Insecurity: Not on file  Transportation Needs: Not on file  Physical Activity: Not on file  Stress: Not on file  Social Connections: Not on file     Family History: The patient's family history is not on file.  ROS:   Please see the history of present illness.    Review of Systems  Constitutional:  Negative for chills, fever and malaise/fatigue.  HENT:  Positive for congestion. Negative for hearing loss.  Eyes:  Negative for blurred vision and redness.  Respiratory:  Negative for shortness of breath.   Cardiovascular:  Positive for palpitations. Negative for chest pain, orthopnea, claudication, leg swelling and PND.  Gastrointestinal:  Negative for blood in stool, melena, nausea and vomiting.  Genitourinary:  Negative for dysuria, flank pain and hematuria.  Musculoskeletal:  Negative for falls, joint pain and myalgias.  Neurological:  Positive for focal weakness (RUE weakness, right hand stiffness). Negative for dizziness and loss of consciousness.    EKGs/Labs/Other Studies Reviewed:    The following studies were reviewed today:  TTE 10/02/21 ([place]): SUMMARY  The left ventricular size is normal.  Left ventricular systolic function is mildly reduced.  LV ejection fraction = 45%.  There are regional wall motion abnormalities as specified below.  There is hypokinesis of the inferolateral and mid to distal  anterolateral/lateral wall.  The right ventricle is borderline dilated.  Right ventricular function cannot be assessed due to poor image  quality.  The aortic sinus is normal size.  The inferior vena cava was not visualized during the exam.  There is no pericardial effusion.  Compared to prior study, Impella CP device has been removed.   -  FINDINGS:  LEFT VENTRICLE  The left ventricular size is normal.  There is normal left ventricular  wall thickness. Left ventricular systolic function is mildly reduced.  LV ejection fraction = 45%. Left ventricular filling pattern is  indeterminate. There are regional wall motion abnormalities as  specified below. There is hypokinesis of the inferolateral and mid to  distal anterolateral/lateral wall.   -  RIGHT VENTRICLE  The right ventricle is borderline dilated. Right ventricular function  cannot be assessed due to poor image quality.   LEFT ATRIUM  The left atrial size is normal.   RIGHT ATRIUM  Right atrial size is normal.  -  AORTIC VALVE  Aortic valve calcification. There is no aortic stenosis. There is  trace aortic regurgitation.  -  MITRAL VALVE  The mitral valve leaflets appear normal. There is trace mitral  regurgitation.  -  TRICUSPID VALVE  Structurally normal tricuspid valve. There is trace tricuspid  regurgitation. There was insufficient TR detected to calculate RV  systolic pressure.  -  PULMONIC VALVE  Limited study, not performed.  -  ARTERIES  The aortic sinus is normal size.  -  VENOUS  The inferior vena cava was not visualized during the exam.  -  EFFUSION  There is no pericardial effusion.    TTE 09/28/21 ([place]): FINDINGS:  LEFT VENTRICLE  No LV thrombus seen.  There is an Impella LVAD in appropriate position approximately 3.2 cm  inside the AV. Left ventricular systolic function is moderate to  severely reduced. LV ejection fraction = 30-35%.    Cath 09/27/21 ([place]): PROCEDURE:  Left heart cath, Coronary angiography, IMA angiography, SVG angiography and PCI   DIAGNOSTIC FINDINGS:    1.  Subtotal occlusion of the ramus/high first diagonal coronary artery.    This is presumed to be the culprit vessel.  2.  Patent LIMA-LAD bypass graft.  3.  Patent SVG-PDA bypass graft.  4.  Patent radial bypass graft to OM2-OM 3 (jump graft)  5.  Chronically occluded vein graft to the right  coronary artery system  (probably unnecessary given patency of the native RCA)  6.  Normal LVEDP, 9 mmHg  7.  A right radial arterial monitoring line was placed.   Due to hypotension,  the patient was started an infusion of phenylephrine.   PCI:    Successful angioplasty and stenting of the ramus/high diagonal coronary  artery with a 2.75 x 28 mm Xience drug-eluting stent.   COMPLICATIONS:  None   RECOMMENDATIONS:   Dual antiplatelet therapy for at least 12 months with no elective  interruptions for the first 3 months.   Consultation with a cardiac electrophysiology service for consideration  of a permanent implantable defibrillator.   Aggressive CVD event prevention with lifestyle changes and medical  therapy.   Will assess and replete electrolytes.   I will add amiodarone in hopes of preventing future VT/VF events.   Consultation with critical care service for management of the  ventilator/respiratory failure.   CT Cardiac Morph 12/22/20 IMPRESSION: 1. The left atrial appendage is a small chicken wing morphology without thrombus.   2. A 20 mm Watchman FLX device is recommended based on the average diameter of the landing zone (17.4 mm, 13% compression) due to is oval shape. The appendage is small and maximum diameter (13.2 mm) will likely preclude a 24 mm device. Would recommend discussion with the structural heart team.   3. There is no thrombus in the left atrial appendage.   4. A mid and mid IAS puncture site is recommended.   5. Optimal deployment angle: RAO 20 CRA 24   6. Normal coronary origin. Right dominance.   7. S/p CABG. A LIMA-LAD and graft-diagonal with jump to the OM are seen. The grafts to the PDA/PLV appear occluded. Native coronary assessment not performed due to lack of NTG and heavy calcification.  IMPRESSION: No acute or significant extracardiac abnormality.  Echo 10/25/20 1. Left ventricular ejection fraction, by estimation, is 60 to 65%.  Left  ventricular ejection fraction by 3D volume is 60 %. The left ventricle has  normal function. The left ventricle demonstrates regional wall motion  abnormalities (see scoring  diagram/findings for description). Left ventricular diastolic parameters  are consistent with Grade I diastolic dysfunction (impaired relaxation).  There is mild hypokinesis of the left ventricular, mid-apical anteroseptal  wall and anterior wall.   2. Abnormal RV free wall strain at -17%. Right ventricular systolic  function is normal. The right ventricular size is normal. There is normal  pulmonary artery systolic pressure. The estimated right ventricular  systolic pressure is Q000111Q mmHg.   3. Normal LA strain.   4. The mitral valve is grossly normal. Trivial mitral valve  regurgitation.   5. The aortic valve is tricuspid. Aortic valve regurgitation is trivial.  Mild aortic valve sclerosis is present, with no evidence of aortic valve  stenosis.   6. The inferior vena cava is normal in size with <50% respiratory  variability, suggesting right atrial pressure of 8 mmHg.   Comparison(s): Changes from prior study are noted. 02/25/2020: LVEF 50-55%,  LAD territory hypokinesis, RVSP 49 mmHG.   CT Abdomen 08/12/20 IMPRESSION: VASCULAR   1. No evidence for active GI bleeding. 2. Mild atherosclerotic disease in the abdominal aorta. Aortic Atherosclerosis (ICD10-I70.0). 3. Main visceral arteries are patent.   NON-VASCULAR   1. No acute abnormalities in the abdomen or pelvis. 2. Again noted is a large left inguinal hernia containing a large portion of the sigmoid colon. No acute inflammatory changes associated with this hernia and no evidence for bowel obstruction. 3. Colonic diverticulosis. 4. Cholelithiasis. 5. Hepatic steatosis.  Cardiac Monitor 03/28/20: Patient monitoring period was from 03/28/20-04/26/20 Overall burden of Afib was <1% (1 hour and 31 minutes  in total). HR ranged in Afib from  93-157bpm Predominant rhythm was NSR with average HR 93bpm (range from 65bpm to 171bpm) Occasional ventricular ectopy (both PVCs and NSVT) with 3% burden; no sustained VT Rare SVE <1% No signficant pauses or sustained ventricular arrhythmias  Echocardiogram 02/25/2020: Impressions: 1. LVEF 50-55% with wall motion abnormalities suspicious for mid LAD. No  apical thrombus.   2. Left ventricular ejection fraction, by estimation, is 50 to 55%. The  left ventricle has low normal function. The left ventricle demonstrates  regional wall motion abnormalities (see scoring diagram/findings for  description). Left ventricular diastolic   parameters are consistent with Grade II diastolic dysfunction  (pseudonormalization). Elevated left atrial pressure.   3. Right ventricular systolic function is normal. The right ventricular  size is normal. There is moderately elevated pulmonary artery systolic  pressure. The estimated right ventricular systolic pressure is Q000111Q mmHg.   4. Left atrial size was mildly dilated.   5. The mitral valve is normal in structure. Moderate mitral valve  regurgitation. No evidence of mitral stenosis.   6. The aortic valve is normal in structure. Aortic valve regurgitation is  not visualized. Mild to moderate aortic valve sclerosis/calcification is  present, without any evidence of aortic stenosis.   7. The inferior vena cava is normal in size with greater than 50%  respiratory variability, suggesting right atrial pressure of 3 mmHg.      Diagnostic cath 03/04/20 Dominance: Right       EKG:   EKG is personally reviewed. 01/18/22: SB, septal infarct pattern HR 55 12/18/2021:  Sinus rhythm. Rate 60 bpm. Septal infarct. 05/17/2020: NSR, septal q waves, isolated PVC, HR 92  Recent Labs: 12/19/2021: Hemoglobin 11.7; Platelets 174 02/20/2022: BUN 25; Creatinine, Ser 1.72; Potassium 5.2; Sodium 139 04/02/2022: ALT 28   Recent Lipid Panel    Component Value Date/Time    CHOL 180 04/02/2022 1113   TRIG 191 (H) 04/02/2022 1113   HDL 30 (L) 04/02/2022 1113   CHOLHDL 6.0 (H) 04/02/2022 1113   CHOLHDL 4 07/26/2020 1144   VLDL 53.6 (H) 07/26/2020 1144   LDLCALC 116 (H) 04/02/2022 1113   LDLDIRECT 93.0 07/26/2020 1144     Risk Assessment/Calculations:    CHA2DS2-VASc Score = 6  This indicates a 9.7% annual risk of stroke. The patient's score is based upon: CHF History: 1 HTN History: 1 Diabetes History: 1 Stroke History: 2 Vascular Disease History: 1 Age Score: 0 Gender Score: 0    Physical Exam:    VS:  Pulse 68   Ht '5\' 8"'$  (1.727 m)   Wt 169 lb 6.4 oz (76.8 kg)   SpO2 98%   BMI 25.76 kg/m     Wt Readings from Last 3 Encounters:  04/13/22 169 lb 6.4 oz (76.8 kg)  01/18/22 163 lb 3.2 oz (74 kg)  12/18/21 160 lb 9.6 oz (72.8 kg)     GEN:  NAD, well appearing HEENT: Normal NECK: No JVD; No carotid bruits CARDIAC: RRR, 2/6 systolic murmur. No rubs or gallops RESPIRATORY:  Clear to auscultation bilaterally, no wheezes, rales, or rhonchi ABDOMEN: Soft, non-tender, non-distended MUSCULOSKELETAL:  No edema; No deformity  SKIN: Warm and dry NEUROLOGIC:  Alert and oriented x 3 PSYCHIATRIC:  Normal affect   ASSESSMENT:    1. Coronary artery disease involving coronary bypass graft of native heart without angina pectoris   2. H/O five vessel coronary artery bypass   3. Coronary artery disease involving native coronary artery of native heart without  angina pectoris   4. Statin intolerance   5. Mixed hyperlipidemia   6. PAF (paroxysmal atrial fibrillation) ([place])   7. Dyslipidemia, goal LDL below 70   8. Acute on chronic systolic heart failure ([place])   9. Medication management     PLAN:    In order of problems listed above:  #Multivessel CAD #VT Arrest #NSTEMI: Patient presented to [facility] hospital on 02/2020 with hematochezia with course complicated by chest pain with VT arrest on 02/23/20 requiring shock and 2-35mn of CPR with return of  ROSC. High sensitivity troponin >15,000. EKG with Q waves in V1-V2 and as well as slight ST depression in V4-V6 and lead II. TTE with LVEF of 50-55% with hypokinesis of mid and distal anterior wall, mid and distal anterior septum, and apex as well as grade 2 diastolic dysfunction. Moderate MR and moderately elevated PASP of 48.7 mmHg. He was stabilized from GIB standpoint and underwent coronary angiography found to have multivessel CAD now s/p CABG on 03/08/20. With LIMA -> LAD, LRA -> Ramus -> OM, SVG -> PDA,  SVG -> PLB of RCA. [facility] [place] Received CPR with shocks x1 with ROSC. Cath with subtotal occlusion of ramus and D1. Bypass grafts patent. Course complicated by cardiogenic shock requiring pressor and impella placement. EF dropped to 10% but improved to 45% prior to discharge. Currently, doing much better without recurrence of anginal symptoms. -Continue plavix '75mg'$  daily -Lipitor stopped due to rising LFTs; now on zetia '10mg'$  and repatha '140mg'$  q2 weeks -Continue vascepa 2g BID -Continue coreg 12.'5mg'$  BID  #Chronic Systolic HF: EF dropped as low as 10% in the setting of cardiac arrest/cardiogenic shock as detailed above. Improved to 45% after revasscularization in 09/2021. Currently euvolemic and compensated on exam with NYHA class II symptoms.  -Continue entresto 24/'26mg'$  BID -Continue farxiga '10mg'$  daily -Continue coreg 12.'5mg'$  BID -Add spironolactone 12.'5mg'$  daily -Once on GDMT for 90 days, can repeat TTE for reassessment of EF -Low Na diet   #Paroxysmal Afib: CHADs-vasc 3. Cardiac monitor with episodes of sustained Afib (<1% burden) . LAA not amenable for Watchman. Now maintained on apixaban. -Off amiodarone -Continue eliquis '5mg'$  BID -Continue coreg 12.'5mg'$  BID -Deemed not to be a good watchman candidate due to unfavorable LAA morphology  #HTN: Well controlled at home mainly 120s. -Continue entresto 24/'26mg'$  BID -Continue coreg  12.'5mg'$  BID -Continue imdur '30mg'$  daily -Start spironolactone 12.'5mg'$  daily -BMET next week   #HLD: -Lipitor stopped due to rising LFTs; now on zetia '10mg'$  and repatha '140mg'$  q2 weeks -Continue vascepa 2g BID -Check lipids next week   #Recurrent GIB: Had initialy GIB during hospitalization in 02/2020 thought to be diverticular in nature. Recurrent GIB in 07/2020 as detailed above with relatively unrevealing work-up (? Diverticular vs gastric ulcer). -Continue PPI -High risk for re-bleed given need for apixaban and plavix   #DMII:  -Management per PCP -Check A1C for monitoring  #AKI on CKD: Developed acute renal failure during hospitalization 09/2021 requiring HD.Cr now significantly improved. Will monitor.    Medication Adjustments/Labs and Tests Ordered: Current medicines are reviewed at length with the patient today.  Concerns regarding medicines are outlined above.   Orders Placed This Encounter  Procedures   Basic metabolic panel   ECHOCARDIOGRAM COMPLETE   Meds ordered this encounter  Medications   sacubitril-valsartan (ENTRESTO) 24-26 MG    Sig: Take 1 tablet by mouth 2 (two) times daily.    Dispense:  180  tablet    Refill:  3   dapagliflozin propanediol (FARXIGA) 10 MG TABS tablet    Sig: Take 1 tablet (10 mg total) by mouth daily before breakfast.    Dispense:  90 tablet    Refill:  3   spironolactone (ALDACTONE) 25 MG tablet    Sig: Take 0.5 tablets (12.5 mg total) by mouth daily.    Dispense:  45 tablet    Refill:  3   There are no Patient Instructions on file for this visit.    Signed, Freada Bergeron, MD  04/13/2022 9:39 AM    [place]

## 2022-04-13 ENCOUNTER — Encounter: Payer: Self-pay | Admitting: Cardiology

## 2022-04-13 ENCOUNTER — Ambulatory Visit: Payer: Managed Care, Other (non HMO) | Attending: Cardiology | Admitting: Cardiology

## 2022-04-13 VITALS — HR 68 | Ht 68.0 in | Wt 169.4 lb

## 2022-04-13 DIAGNOSIS — I25810 Atherosclerosis of coronary artery bypass graft(s) without angina pectoris: Secondary | ICD-10-CM | POA: Diagnosis not present

## 2022-04-13 DIAGNOSIS — Z79899 Other long term (current) drug therapy: Secondary | ICD-10-CM

## 2022-04-13 DIAGNOSIS — Z789 Other specified health status: Secondary | ICD-10-CM

## 2022-04-13 DIAGNOSIS — Z951 Presence of aortocoronary bypass graft: Secondary | ICD-10-CM

## 2022-04-13 DIAGNOSIS — I2510 Atherosclerotic heart disease of native coronary artery without angina pectoris: Secondary | ICD-10-CM

## 2022-04-13 DIAGNOSIS — E785 Hyperlipidemia, unspecified: Secondary | ICD-10-CM

## 2022-04-13 DIAGNOSIS — I480 Paroxysmal atrial fibrillation: Secondary | ICD-10-CM

## 2022-04-13 DIAGNOSIS — I5023 Acute on chronic systolic (congestive) heart failure: Secondary | ICD-10-CM

## 2022-04-13 DIAGNOSIS — E782 Mixed hyperlipidemia: Secondary | ICD-10-CM

## 2022-04-13 MED ORDER — SPIRONOLACTONE 25 MG PO TABS
12.5000 mg | ORAL_TABLET | Freq: Every day | ORAL | 3 refills | Status: DC
  Filled 2022-07-20: qty 45, 90d supply, fill #0
  Filled 2022-10-25: qty 15, 30d supply, fill #0
  Filled 2022-11-28: qty 15, 30d supply, fill #1
  Filled 2022-12-25: qty 15, 30d supply, fill #2
  Filled 2023-01-25 – 2023-01-29 (×5): qty 15, 30d supply, fill #3
  Filled 2023-02-11: qty 15, 30d supply, fill #4
  Filled 2023-03-20: qty 15, 30d supply, fill #5

## 2022-04-13 MED ORDER — SACUBITRIL-VALSARTAN 24-26 MG PO TABS
1.0000 | ORAL_TABLET | Freq: Two times a day (BID) | ORAL | 3 refills | Status: DC
  Filled 2022-07-24: qty 60, 30d supply, fill #0

## 2022-04-13 MED ORDER — DAPAGLIFLOZIN PROPANEDIOL 10 MG PO TABS: 10.0000 mg | ORAL_TABLET | Freq: Every day | ORAL | 3 refills | Status: DC

## 2022-04-13 NOTE — Patient Instructions (Signed)
Medication Instructions:   START TAKING SPIRONOLACTONE 12.5 MG BY MOUTH DAILY  *If you need a refill on your cardiac medications before your next appointment, please call your pharmacy*   Lab Work:  [place] OFFICE--BMET  If you have labs (blood work) drawn today and your tests are completely normal, you will receive your results only by: [place] (if you have MyChart) OR A paper copy in the mail If you have any lab test that is abnormal or we need to change your treatment, we will call you to review the results.   Testing/Procedures:  Your physician has requested that you have an echocardiogram. Echocardiography is a painless test that uses sound waves to create images of your heart. It provides your doctor with information about the size and shape of your heart and how well your heart's chambers and valves are working. This procedure takes approximately one hour. There are no restrictions for this procedure. SCHEDULE ECHO TO BE DONE IN 3 MONTHS PER DR. Johney Frame   Please do NOT wear cologne, perfume, aftershave, or lotions (deodorant is allowed). Please arrive 15 minutes prior to your appointment time.    Follow-Up:  3-4 MONTH FOLLOW-UP WITH DR. Jacinta Shoe TO ADD TO ANY OPEN SLOT PER DR. Johney Frame AND Vyncent Overby

## 2022-04-18 ENCOUNTER — Other Ambulatory Visit: Payer: Self-pay

## 2022-04-18 DIAGNOSIS — E785 Hyperlipidemia, unspecified: Secondary | ICD-10-CM

## 2022-04-18 DIAGNOSIS — Z951 Presence of aortocoronary bypass graft: Secondary | ICD-10-CM

## 2022-04-18 DIAGNOSIS — Z79899 Other long term (current) drug therapy: Secondary | ICD-10-CM

## 2022-04-18 DIAGNOSIS — I2510 Atherosclerotic heart disease of native coronary artery without angina pectoris: Secondary | ICD-10-CM

## 2022-04-18 DIAGNOSIS — I480 Paroxysmal atrial fibrillation: Secondary | ICD-10-CM

## 2022-04-18 DIAGNOSIS — E782 Mixed hyperlipidemia: Secondary | ICD-10-CM

## 2022-04-18 DIAGNOSIS — Z789 Other specified health status: Secondary | ICD-10-CM

## 2022-04-18 DIAGNOSIS — I5023 Acute on chronic systolic (congestive) heart failure: Secondary | ICD-10-CM

## 2022-04-18 DIAGNOSIS — I25810 Atherosclerosis of coronary artery bypass graft(s) without angina pectoris: Secondary | ICD-10-CM

## 2022-04-18 MED ORDER — FARXIGA 10 MG PO TABS
10.0000 mg | ORAL_TABLET | Freq: Every day | ORAL | 3 refills | Status: DC
  Filled 2022-07-20 – 2022-10-10 (×2): qty 30, 30d supply, fill #0
  Filled 2022-10-31 – 2022-11-02 (×2): qty 30, 30d supply, fill #1
  Filled 2022-12-11: qty 30, 30d supply, fill #2
  Filled 2023-01-12: qty 30, 30d supply, fill #3
  Filled 2023-02-11: qty 30, 30d supply, fill #4

## 2022-04-20 ENCOUNTER — Ambulatory Visit: Payer: Managed Care, Other (non HMO) | Attending: Cardiology

## 2022-04-20 DIAGNOSIS — I5023 Acute on chronic systolic (congestive) heart failure: Secondary | ICD-10-CM

## 2022-04-20 DIAGNOSIS — Z79899 Other long term (current) drug therapy: Secondary | ICD-10-CM

## 2022-04-20 DIAGNOSIS — Z951 Presence of aortocoronary bypass graft: Secondary | ICD-10-CM

## 2022-04-20 DIAGNOSIS — I2510 Atherosclerotic heart disease of native coronary artery without angina pectoris: Secondary | ICD-10-CM

## 2022-04-20 DIAGNOSIS — Z789 Other specified health status: Secondary | ICD-10-CM

## 2022-04-20 DIAGNOSIS — I25810 Atherosclerosis of coronary artery bypass graft(s) without angina pectoris: Secondary | ICD-10-CM

## 2022-04-20 DIAGNOSIS — I480 Paroxysmal atrial fibrillation: Secondary | ICD-10-CM

## 2022-04-20 DIAGNOSIS — E782 Mixed hyperlipidemia: Secondary | ICD-10-CM

## 2022-04-20 DIAGNOSIS — E785 Hyperlipidemia, unspecified: Secondary | ICD-10-CM

## 2022-04-21 LAB — BASIC METABOLIC PANEL
BUN/Creatinine Ratio: 19 (ref 10–24)
BUN: 25 mg/dL (ref 8–27)
CO2: 23 mmol/L (ref 20–29)
Calcium: 9.7 mg/dL (ref 8.6–10.2)
Chloride: 103 mmol/L (ref 96–106)
Creatinine, Ser: 1.32 mg/dL — ABNORMAL HIGH (ref 0.76–1.27)
Glucose: 192 mg/dL — ABNORMAL HIGH (ref 70–99)
Potassium: 4.9 mmol/L (ref 3.5–5.2)
Sodium: 140 mmol/L (ref 134–144)
eGFR: 62 mL/min/{1.73_m2} (ref >59)

## 2022-04-27 ENCOUNTER — Telehealth: Payer: Self-pay | Admitting: Internal Medicine

## 2022-04-27 ENCOUNTER — Other Ambulatory Visit: Payer: Self-pay | Admitting: Internal Medicine

## 2022-04-27 DIAGNOSIS — M159 Polyosteoarthritis, unspecified: Secondary | ICD-10-CM

## 2022-04-27 MED ORDER — TRAMADOL HCL 50 MG PO TABS: 50.0000 mg | ORAL_TABLET | Freq: Two times a day (BID) | ORAL | 0 refills | Status: DC | PRN

## 2022-04-27 NOTE — Telephone Encounter (Signed)
Prescription Request  04/27/2022  LOV: 11/14/2021  What is the name of the medication or equipment? traMADol '50mg'$   Have you contacted your pharmacy to request a refill? No   Which pharmacy would you like this sent to?  [id number]: [phone]: [phone]   Patient notified that their request is being sent to the clinical staff for review and that they should receive a response within 2 business days.   Please advise at [facility]

## 2022-04-30 ENCOUNTER — Telehealth: Payer: Self-pay

## 2022-04-30 NOTE — Telephone Encounter (Signed)
PA approved.

## 2022-04-30 NOTE — Telephone Encounter (Signed)
Key: [id number]

## 2022-05-15 ENCOUNTER — Encounter: Payer: Self-pay | Admitting: Internal Medicine

## 2022-05-15 ENCOUNTER — Ambulatory Visit (INDEPENDENT_AMBULATORY_CARE_PROVIDER_SITE_OTHER): Payer: Managed Care, Other (non HMO) | Admitting: Internal Medicine

## 2022-05-15 VITALS — BP 110/62 | HR 65 | Temp 98.0°F | Ht 68.0 in | Wt 173.0 lb

## 2022-05-15 DIAGNOSIS — Z0001 Encounter for general adult medical examination with abnormal findings: Secondary | ICD-10-CM

## 2022-05-15 DIAGNOSIS — Z794 Long term (current) use of insulin: Secondary | ICD-10-CM | POA: Diagnosis not present

## 2022-05-15 DIAGNOSIS — D500 Iron deficiency anemia secondary to blood loss (chronic): Secondary | ICD-10-CM

## 2022-05-15 DIAGNOSIS — D510 Vitamin B12 deficiency anemia due to intrinsic factor deficiency: Secondary | ICD-10-CM

## 2022-05-15 DIAGNOSIS — E1159 Type 2 diabetes mellitus with other circulatory complications: Secondary | ICD-10-CM

## 2022-05-15 DIAGNOSIS — E119 Type 2 diabetes mellitus without complications: Secondary | ICD-10-CM

## 2022-05-15 DIAGNOSIS — I2510 Atherosclerotic heart disease of native coronary artery without angina pectoris: Secondary | ICD-10-CM

## 2022-05-15 DIAGNOSIS — I10 Essential (primary) hypertension: Secondary | ICD-10-CM

## 2022-05-15 DIAGNOSIS — E1142 Type 2 diabetes mellitus with diabetic polyneuropathy: Secondary | ICD-10-CM

## 2022-05-15 DIAGNOSIS — N184 Chronic kidney disease, stage 4 (severe): Secondary | ICD-10-CM | POA: Diagnosis not present

## 2022-05-15 LAB — CBC WITH DIFFERENTIAL/PLATELET
Basophils Absolute: 0 10*3/uL (ref 0.0–0.1)
Basophils Relative: 0.4 % (ref 0.0–3.0)
Eosinophils Absolute: 0.2 10*3/uL (ref 0.0–0.7)
Eosinophils Relative: 2.2 % (ref 0.0–5.0)
HCT: 41.5 % (ref 39.0–52.0)
Hemoglobin: 14.1 g/dL (ref 13.0–17.0)
Lymphocytes Relative: 34.9 % (ref 12.0–46.0)
Lymphs Abs: 2.9 10*3/uL (ref 0.7–4.0)
MCHC: 34.1 g/dL (ref 30.0–36.0)
MCV: 97.1 fl (ref 78.0–100.0)
Monocytes Absolute: 0.5 10*3/uL (ref 0.1–1.0)
Monocytes Relative: 5.8 % (ref 3.0–12.0)
Neutro Abs: 4.7 10*3/uL (ref 1.4–7.7)
Neutrophils Relative %: 56.7 % (ref 43.0–77.0)
Platelets: 154 10*3/uL (ref 150.0–400.0)
RBC: 4.27 Mil/uL (ref 4.22–5.81)
RDW: 13.2 % (ref 11.5–15.5)
WBC: 8.3 10*3/uL (ref 4.0–10.5)

## 2022-05-15 LAB — IBC + FERRITIN
Ferritin: 214.4 ng/mL (ref 22.0–322.0)
Iron: 101 ug/dL (ref 42–165)
Saturation Ratios: 30.6 % (ref 20.0–50.0)
TIBC: 330.4 ug/dL (ref 250.0–450.0)
Transferrin: 236 mg/dL (ref 212.0–360.0)

## 2022-05-15 LAB — FOLATE: Folate: >23.8 ng/mL (ref >5.9)

## 2022-05-15 LAB — TSH: TSH: 2.28 u[IU]/mL (ref 0.35–5.50)

## 2022-05-15 LAB — MICROALBUMIN / CREATININE URINE RATIO
Creatinine,U: 78.3 mg/dL
Microalb Creat Ratio: 0.9 mg/g (ref 0.0–30.0)
Microalb, Ur: <0.7 mg/dL (ref 0.0–1.9)

## 2022-05-15 LAB — VITAMIN B12: Vitamin B-12: 1061 pg/mL — ABNORMAL HIGH (ref 211–911)

## 2022-05-15 NOTE — Progress Notes (Signed)
Subjective:  Patient ID: Colton Guzman., male    DOB: [birth year]  Age: 62 y.o. MRN: [id number]  CC: Annual Exam, Hyperlipidemia, Diabetes, and Anemia   HPI [facility] [employer]. presents for a CPX and f/up ---  He has not been monitoring his blood sugar.  He complains of polys and is concerned that his blood pressure is not well-controlled.  He complains of persistent numbness in his feet and weight gain.  Outpatient Medications Prior to Visit  Medication Sig Dispense Refill   allopurinol (ZYLOPRIM) 100 MG tablet TAKE 1 TABLET BY MOUTH EVERY DAY 90 tablet 0   apixaban (ELIQUIS) 5 MG TABS tablet Take 1 tablet (5 mg total) by mouth 2 (two) times daily. 60 tablet 6   ASPIRIN LOW DOSE 81 MG EC tablet Take 81 mg by mouth every 6 (six) hours as needed for moderate pain.     carvedilol (COREG) 12.5 MG tablet Take 1 tablet (12.5 mg total) by mouth 2 (two) times daily. 180 tablet 2   clopidogrel (PLAVIX) 75 MG tablet Take 1 tablet (75 mg total) by mouth daily. 90 tablet 3   dapagliflozin propanediol (FARXIGA) 10 MG TABS tablet Take 1 tablet (10 mg total) by mouth daily before breakfast. 90 tablet 3   Evolocumab (REPATHA SURECLICK) [id number] MG/ML SOAJ Inject 140 mg into the skin every 14 (fourteen) days. 2 mL 11   ezetimibe (ZETIA) 10 MG tablet Take 1 tablet (10 mg total) by mouth daily. 90 tablet 3   Fe Bisgly-Vit C-Vit B12-FA (GENTLE IRON) 28-60-0.008-0.4 MG CAPS Take 1 tablet by mouth in the morning.     icosapent Ethyl (VASCEPA) 1 g capsule Take 2 capsules (2 g total) by mouth 2 (two) times daily. 360 capsule 3   isosorbide mononitrate (IMDUR) 30 MG 24 hr tablet Take 1 tablet (30 mg total) by mouth daily. 90 tablet 2   Magnesium 300 MG CAPS Take 300 mg by mouth daily.     Multiple Vitamin (MULTIVITAMIN WITH MINERALS) TABS tablet Take 1 tablet by mouth daily.     pantoprazole (PROTONIX) 40 MG tablet Please take 40 mg oral twice daily x30 days, then 40 mg oral daily. 90 tablet 0    sacubitril-valsartan (ENTRESTO) 24-26 MG Take 1 tablet by mouth 2 (two) times daily. 180 tablet 3   spironolactone (ALDACTONE) 25 MG tablet Take 0.5 tablets (12.5 mg total) by mouth daily. 45 tablet 3   traMADol (ULTRAM) 50 MG tablet Take 1 tablet (50 mg total) by mouth every 12 (twelve) hours as needed. 180 tablet 0   vitamin B-12 (CYANOCOBALAMIN) 1000 MCG tablet Take 1,000 mcg by mouth in the morning.     No facility-administered medications prior to visit.    ROS Review of Systems  Constitutional:  Positive for unexpected weight change (wt gain). Negative for appetite change, chills, diaphoresis and fatigue.  HENT: Negative.    Eyes: Negative.   Respiratory: Negative.  Negative for cough, chest tightness, shortness of breath and wheezing.   Cardiovascular:  Negative for chest pain, palpitations and leg swelling.  Gastrointestinal:  Negative for abdominal pain, constipation, diarrhea, nausea and vomiting.  Endocrine: Positive for polydipsia, polyphagia and polyuria.  Genitourinary: Negative.  Negative for difficulty urinating, dysuria, hematuria, scrotal swelling and testicular pain.  Musculoskeletal: Negative.   Skin: Negative.   Neurological:  Positive for numbness. Negative for dizziness and weakness.  Hematological:  Negative for adenopathy. Does not bruise/bleed easily.  Psychiatric/Behavioral: Negative.  Objective:  BP 110/62 (BP Location: Left Arm, Patient Position: Sitting, Cuff Size: Large)   Pulse 65   Temp 98 F (36.7 C) (Oral)   Ht 5\' 8"  (1.727 m)   Wt 173 lb (78.5 kg)   SpO2 95%   BMI 26.30 kg/m   BP Readings from Last 3 Encounters:  05/15/22 110/62  01/18/22 136/80  12/18/21 131/86    Wt Readings from Last 3 Encounters:  05/15/22 173 lb (78.5 kg)  04/13/22 169 lb 6.4 oz (76.8 kg)  01/18/22 163 lb 3.2 oz (74 kg)    Physical Exam Vitals reviewed.  Constitutional:      Appearance: Normal appearance.  HENT:     Nose: Nose normal.     Mouth/Throat:      Mouth: Mucous membranes are moist.  Eyes:     General: No scleral icterus.    Conjunctiva/sclera: Conjunctivae normal.  Cardiovascular:     Rate and Rhythm: Normal rate and regular rhythm.     Heart sounds: No murmur heard.    No friction rub. No gallop.  Pulmonary:     Effort: Pulmonary effort is normal.     Breath sounds: No stridor. No wheezing, rhonchi or rales.  Abdominal:     General: Abdomen is flat.     Palpations: There is no mass.     Tenderness: There is no abdominal tenderness. There is no guarding.     Hernia: No hernia is present. There is no hernia in the left inguinal area or right inguinal area.  Genitourinary:    Pubic Area: No rash.      Penis: Normal and circumcised.      Testes: Normal.     Epididymis:     Right: Normal.     Left: Normal.     Prostate: Normal. Not enlarged, not tender and no nodules present.     Rectum: Normal. Guaiac result negative. No mass, tenderness, anal fissure, external hemorrhoid or internal hemorrhoid. Normal anal tone.  Musculoskeletal:        General: Normal range of motion.     Cervical back: Neck supple.     Right lower leg: No edema.     Left lower leg: No edema.  Lymphadenopathy:     Cervical: No cervical adenopathy.     Lower Body: No right inguinal adenopathy. No left inguinal adenopathy.  Skin:    General: Skin is warm and dry.     Coloration: Skin is not pale.  Neurological:     General: No focal deficit present.     Mental Status: He is alert. Mental status is at baseline.  Psychiatric:        Mood and Affect: Mood normal.        Behavior: Behavior normal.     Lab Results  Component Value Date   WBC 8.3 05/15/2022   HGB 14.1 05/15/2022   HCT 41.5 05/15/2022   PLT 154.0 05/15/2022   GLUCOSE 192 (H) 04/20/2022   CHOL 180 04/02/2022   TRIG 191 (H) 04/02/2022   HDL 30 (L) 04/02/2022   LDLDIRECT 93.0 07/26/2020   LDLCALC 116 (H) 04/02/2022   ALT 28 04/02/2022   AST 19 04/02/2022   NA 140 04/20/2022    K 4.9 04/20/2022   CL 103 04/20/2022   CREATININE 1.32 (H) 04/20/2022   BUN 25 04/20/2022   CO2 23 04/20/2022   TSH 2.28 05/15/2022   PSA 0.13 03/28/2021   INR 1.2 (H) 05/18/2020   HGBA1C  8.7 (H) 05/15/2022   MICROALBUR <0.7 05/15/2022    No results found.  Assessment & Plan:   Chronic renal disease, stage 4, severely decreased glomerular filtration rate (GFR) between 15-29 mL/min/1.73 square meter ([place])- His renal function has improved. -     Urinalysis, Routine w reflex microscopic; Future -     Microalbumin / creatinine urine ratio; Future  Primary hypertension- His blood pressure is adequately well-controlled. -     TSH; Future  Insulin-requiring or dependent type II diabetes mellitus ([place])- His blood sugar is not adequately well-controlled.  Will start basal insulin and a GLP-1 agonist. -     Urinalysis, Routine w reflex microscopic; Future -     Hemoglobin A1c; Future -     Microalbumin / creatinine urine ratio; Future -     Ambulatory referral to Ophthalmology -     HM Diabetes Foot Exam -     Toujeo Max SoloStar; Inject 20 Units into the skin daily.  Dispense: 6 mL; Refill: 1 -     FreeStyle Libre 3 Sensor; 1 Act by Does not apply route daily. Place 1 sensor on the skin every 14 days. Use to check glucose continuously  Dispense: 2 each; Refill: 5 -     FreeStyle Libre 3 Reader; 1 Act by Does not apply route daily.  Dispense: 1 each; Refill: 3 -     Amb Referral to Nutrition and Diabetic Education -     Insulin Pen Needle; 1 Act by Does not apply route daily.  Dispense: 100 each; Refill: 1  Iron deficiency anemia due to chronic blood loss- His H&H are normal now. -     IBC + Ferritin; Future -     CBC with Differential/Platelet; Future  Type 2 diabetes mellitus with other circulatory complication, without long-term current use of insulin ([place]) -     Rybelsus; Take 1 tablet (3 mg total) by mouth daily.  Dispense: 30 tablet; Refill: 0  Vitamin B12 deficiency anemia  due to intrinsic factor deficiency- H&H, B12, and folate are all normal. -     Vitamin B12; Future -     CBC with Differential/Platelet; Future -     Folate; Future  Coronary artery disease involving native coronary artery of native heart without angina pectoris -     Rybelsus; Take 1 tablet (3 mg total) by mouth daily.  Dispense: 30 tablet; Refill: 0  Diabetic polyneuropathy associated with type 2 diabetes mellitus ([place])- Will try to get better control of his blood sugar.  Encounter for general adult medical examination with abnormal findings- Exam completed, labs reviewed, vaccines reviewed and updated, cancer screenings are up-to-date, patient education was given.     Follow-up: Return in about 6 months (around 11/15/2022).  Scarlette Calico, MD

## 2022-05-15 NOTE — Patient Instructions (Signed)
Health Maintenance, Male Adopting a healthy lifestyle and getting preventive care are important in promoting health and wellness. Ask your health care provider about: The right schedule for you to have regular tests and exams. Things you can do on your own to prevent diseases and keep yourself healthy. What should I know about diet, weight, and exercise? Eat a healthy diet  Eat a diet that includes plenty of vegetables, fruits, low-fat dairy products, and lean protein. Do not eat a lot of foods that are high in solid fats, added sugars, or sodium. Maintain a healthy weight Body mass index (BMI) is a measurement that can be used to identify possible weight problems. It estimates body fat based on height and weight. Your health care provider can help determine your BMI and help you achieve or maintain a healthy weight. Get regular exercise Get regular exercise. This is one of the most important things you can do for your health. Most adults should: Exercise for at least 150 minutes each week. The exercise should increase your heart rate and make you sweat (moderate-intensity exercise). Do strengthening exercises at least twice a week. This is in addition to the moderate-intensity exercise. Spend less time sitting. Even light physical activity can be beneficial. Watch cholesterol and blood lipids Have your blood tested for lipids and cholesterol at 60 years of age, then have this test every 5 years. You may need to have your cholesterol levels checked more often if: Your lipid or cholesterol levels are high. You are older than 60 years of age. You are at high risk for heart disease. What should I know about cancer screening? Many types of cancers can be detected early and may often be prevented. Depending on your health history and family history, you may need to have cancer screening at various ages. This may include screening for: Colorectal cancer. Prostate cancer. Skin cancer. Lung  cancer. What should I know about heart disease, diabetes, and high blood pressure? Blood pressure and heart disease High blood pressure causes heart disease and increases the risk of stroke. This is more likely to develop in people who have high blood pressure readings or are overweight. Talk with your health care provider about your target blood pressure readings. Have your blood pressure checked: Every 3-5 years if you are 18-39 years of age. Every year if you are 40 years old or older. If you are between the ages of 65 and 75 and are a current or former smoker, ask your health care provider if you should have a one-time screening for abdominal aortic aneurysm (AAA). Diabetes Have regular diabetes screenings. This checks your fasting blood sugar level. Have the screening done: Once every three years after age 45 if you are at a normal weight and have a low risk for diabetes. More often and at a younger age if you are overweight or have a high risk for diabetes. What should I know about preventing infection? Hepatitis B If you have a higher risk for hepatitis B, you should be screened for this virus. Talk with your health care provider to find out if you are at risk for hepatitis B infection. Hepatitis C Blood testing is recommended for: Everyone born from 1945 through 1965. Anyone with known risk factors for hepatitis C. Sexually transmitted infections (STIs) You should be screened each year for STIs, including gonorrhea and chlamydia, if: You are sexually active and are younger than 60 years of age. You are older than 60 years of age and your   health care provider tells you that you are at risk for this type of infection. Your sexual activity has changed since you were last screened, and you are at increased risk for chlamydia or gonorrhea. Ask your health care provider if you are at risk. Ask your health care provider about whether you are at high risk for HIV. Your health care provider  may recommend a prescription medicine to help prevent HIV infection. If you choose to take medicine to prevent HIV, you should first get tested for HIV. You should then be tested every 3 months for as long as you are taking the medicine. Follow these instructions at home: Alcohol use Do not drink alcohol if your health care provider tells you not to drink. If you drink alcohol: Limit how much you have to 0-2 drinks a day. Know how much alcohol is in your drink. In the [place] one drink equals one 12 oz bottle of beer (355 mL), one 5 oz glass of wine (148 mL), or one 1 oz glass of hard liquor (44 mL). Lifestyle Do not use any products that contain nicotine or tobacco. These products include cigarettes, chewing tobacco, and vaping devices, such as e-cigarettes. If you need help quitting, ask your health care provider. Do not use street drugs. Do not share needles. Ask your health care provider for help if you need support or information about quitting drugs. General instructions Schedule regular health, dental, and eye exams. Stay current with your vaccines. Tell your health care provider if: You often feel depressed. You have ever been abused or do not feel safe at home. Summary Adopting a healthy lifestyle and getting preventive care are important in promoting health and wellness. Follow your health care provider's instructions about healthy diet, exercising, and getting tested or screened for diseases. Follow your health care provider's instructions on monitoring your cholesterol and blood pressure. This information is not intended to replace advice given to you by your health care provider. Make sure you discuss any questions you have with your health care provider. Document Revised: 06/27/2020 Document Reviewed: 06/27/2020 Elsevier Patient Education  2023 [employer]  

## 2022-05-16 ENCOUNTER — Telehealth: Payer: Self-pay | Admitting: Internal Medicine

## 2022-05-16 ENCOUNTER — Encounter: Payer: Self-pay | Admitting: Internal Medicine

## 2022-05-16 LAB — URINALYSIS, ROUTINE W REFLEX MICROSCOPIC
Bilirubin Urine: NEGATIVE
Hgb urine dipstick: NEGATIVE
Ketones, ur: NEGATIVE
Leukocytes,Ua: NEGATIVE
Nitrite: NEGATIVE
RBC / HPF: NONE SEEN (ref 0–?)
Specific Gravity, Urine: 1.01 (ref 1.000–1.030)
Total Protein, Urine: NEGATIVE
Urine Glucose: >=1000 — AB
Urobilinogen, UA: 0.2 (ref 0.0–1.0)
WBC, UA: NONE SEEN (ref 0–?)
pH: 6 (ref 5.0–8.0)

## 2022-05-16 LAB — HEMOGLOBIN A1C: Hgb A1c MFr Bld: 8.7 % — ABNORMAL HIGH (ref 4.6–6.5)

## 2022-05-16 MED ORDER — METFORMIN HCL ER 750 MG PO TB24: 750.0000 mg | ORAL_TABLET | Freq: Every day | ORAL | 0 refills | Status: DC

## 2022-05-16 MED ORDER — FREESTYLE LIBRE 3 READER DEVI: 1.0000 | Freq: Every day | 3 refills | Status: DC

## 2022-05-16 MED ORDER — TOUJEO MAX SOLOSTAR 300 UNIT/ML ~~LOC~~ SOPN: 20.0000 [IU] | PEN_INJECTOR | Freq: Every day | SUBCUTANEOUS | 1 refills | Status: DC

## 2022-05-16 MED ORDER — INSULIN PEN NEEDLE 32G X 6 MM MISC
Freq: Every day | 0 refills | Status: DC
  Filled 2022-07-23: qty 100, 90d supply, fill #0

## 2022-05-16 MED ORDER — FREESTYLE LIBRE 3 SENSOR MISC
1.0000 | Freq: Every day | 5 refills | Status: DC
  Filled 2022-07-20 – 2022-08-13 (×2): qty 2, 28d supply, fill #0
  Filled 2022-09-03: qty 2, 30d supply, fill #0
  Filled 2022-09-03 – 2023-01-03 (×5): qty 2, 28d supply, fill #0

## 2022-05-16 NOTE — Telephone Encounter (Signed)
Called pt to clarify msg. Pt states he had his AC check yesterday, and was told to contact MD because his A1C 8.7 . Pt states  he's only been taking the Faxiga 10 mg for diabetes. Should he be taking anything else for diabetes.Marland KitchenJohny Guzman

## 2022-05-16 NOTE — Telephone Encounter (Signed)
Pt called in about medication dapagliflozin propanediol (FARXIGA) 10 MG TABS tablet Stating something about increasing medication. Please give pt a called back I really had a hard time understanding what he was really trying to say.

## 2022-05-17 ENCOUNTER — Encounter: Payer: Self-pay | Admitting: Cardiology

## 2022-05-17 ENCOUNTER — Encounter: Payer: Self-pay | Admitting: Internal Medicine

## 2022-05-17 MED ORDER — RYBELSUS 3 MG PO TABS: 3.0000 mg | ORAL_TABLET | Freq: Every day | ORAL | 0 refills | Status: DC

## 2022-05-19 DIAGNOSIS — E1142 Type 2 diabetes mellitus with diabetic polyneuropathy: Secondary | ICD-10-CM | POA: Insufficient documentation

## 2022-05-19 DIAGNOSIS — I2510 Atherosclerotic heart disease of native coronary artery without angina pectoris: Secondary | ICD-10-CM | POA: Insufficient documentation

## 2022-06-11 ENCOUNTER — Ambulatory Visit: Payer: Managed Care, Other (non HMO) | Attending: Cardiology

## 2022-06-11 DIAGNOSIS — E785 Hyperlipidemia, unspecified: Secondary | ICD-10-CM

## 2022-06-12 LAB — LIPID PANEL
Chol/HDL Ratio: 2.8 ratio (ref 0.0–5.0)
Cholesterol, Total: 88 mg/dL — ABNORMAL LOW (ref 100–199)
HDL: 31 mg/dL — ABNORMAL LOW (ref >39)
LDL Chol Calc (NIH): 28 mg/dL (ref 0–99)
Triglycerides: 175 mg/dL — ABNORMAL HIGH (ref 0–149)
VLDL Cholesterol Cal: 29 mg/dL (ref 5–40)

## 2022-06-12 LAB — HEPATIC FUNCTION PANEL
ALT: 48 IU/L — ABNORMAL HIGH (ref 0–44)
AST: 29 IU/L (ref 0–40)
Albumin: 4.6 g/dL (ref 3.8–4.9)
Alkaline Phosphatase: 83 IU/L (ref 44–121)
Bilirubin Total: 0.3 mg/dL (ref 0.0–1.2)
Bilirubin, Direct: 0.17 mg/dL (ref 0.00–0.40)
Total Protein: 8 g/dL (ref 6.0–8.5)

## 2022-06-20 ENCOUNTER — Other Ambulatory Visit: Payer: Self-pay | Admitting: Internal Medicine

## 2022-06-20 DIAGNOSIS — E1159 Type 2 diabetes mellitus with other circulatory complications: Secondary | ICD-10-CM

## 2022-06-20 DIAGNOSIS — I25810 Atherosclerosis of coronary artery bypass graft(s) without angina pectoris: Secondary | ICD-10-CM

## 2022-06-20 DIAGNOSIS — E119 Type 2 diabetes mellitus without complications: Secondary | ICD-10-CM

## 2022-06-20 DIAGNOSIS — I2510 Atherosclerotic heart disease of native coronary artery without angina pectoris: Secondary | ICD-10-CM

## 2022-06-20 MED ORDER — RYBELSUS 7 MG PO TABS: 7.0000 mg | ORAL_TABLET | Freq: Every day | ORAL | 0 refills | Status: DC

## 2022-06-25 ENCOUNTER — Ambulatory Visit (HOSPITAL_COMMUNITY): Payer: Managed Care, Other (non HMO) | Attending: Cardiology

## 2022-06-25 DIAGNOSIS — I5023 Acute on chronic systolic (congestive) heart failure: Secondary | ICD-10-CM | POA: Diagnosis present

## 2022-06-25 DIAGNOSIS — Z951 Presence of aortocoronary bypass graft: Secondary | ICD-10-CM | POA: Diagnosis present

## 2022-06-25 DIAGNOSIS — I25810 Atherosclerosis of coronary artery bypass graft(s) without angina pectoris: Secondary | ICD-10-CM | POA: Insufficient documentation

## 2022-06-25 LAB — ECHOCARDIOGRAM COMPLETE
Area-P 1/2: 2.86 cm2
S' Lateral: 2.5 cm

## 2022-06-25 MED ORDER — PERFLUTREN LIPID MICROSPHERE
1.0000 mL | INTRAVENOUS | Status: AC | PRN
Start: 2022-06-25 — End: 2022-06-25
  Administered 2022-06-25: 2 mL via INTRAVENOUS

## 2022-06-27 ENCOUNTER — Other Ambulatory Visit: Payer: Self-pay | Admitting: Internal Medicine

## 2022-07-13 ENCOUNTER — Other Ambulatory Visit: Payer: Self-pay

## 2022-07-13 ENCOUNTER — Emergency Department (HOSPITAL_COMMUNITY)
Admission: EM | Admit: 2022-07-13 | Discharge: 2022-07-13 | Disposition: A | Discharge status: Home / Self Care | Payer: Managed Care, Other (non HMO) | Attending: Emergency Medicine | Admitting: Emergency Medicine

## 2022-07-13 ENCOUNTER — Encounter (HOSPITAL_COMMUNITY): Payer: Self-pay | Admitting: Emergency Medicine

## 2022-07-13 ENCOUNTER — Other Ambulatory Visit (HOSPITAL_COMMUNITY): Payer: Managed Care, Other (non HMO)

## 2022-07-13 ENCOUNTER — Emergency Department (HOSPITAL_COMMUNITY): Payer: Managed Care, Other (non HMO)

## 2022-07-13 DIAGNOSIS — Z7902 Long term (current) use of antithrombotics/antiplatelets: Secondary | ICD-10-CM | POA: Insufficient documentation

## 2022-07-13 DIAGNOSIS — E1165 Type 2 diabetes mellitus with hyperglycemia: Secondary | ICD-10-CM | POA: Insufficient documentation

## 2022-07-13 DIAGNOSIS — I2510 Atherosclerotic heart disease of native coronary artery without angina pectoris: Secondary | ICD-10-CM | POA: Diagnosis not present

## 2022-07-13 DIAGNOSIS — N179 Acute kidney failure, unspecified: Secondary | ICD-10-CM | POA: Diagnosis not present

## 2022-07-13 DIAGNOSIS — Z7984 Long term (current) use of oral hypoglycemic drugs: Secondary | ICD-10-CM | POA: Insufficient documentation

## 2022-07-13 DIAGNOSIS — Z7982 Long term (current) use of aspirin: Secondary | ICD-10-CM | POA: Diagnosis not present

## 2022-07-13 DIAGNOSIS — R079 Chest pain, unspecified: Secondary | ICD-10-CM | POA: Diagnosis present

## 2022-07-13 DIAGNOSIS — R739 Hyperglycemia, unspecified: Secondary | ICD-10-CM

## 2022-07-13 DIAGNOSIS — Z794 Long term (current) use of insulin: Secondary | ICD-10-CM | POA: Diagnosis not present

## 2022-07-13 DIAGNOSIS — Z7901 Long term (current) use of anticoagulants: Secondary | ICD-10-CM | POA: Diagnosis not present

## 2022-07-13 LAB — I-STAT CHEM 8, ED
BUN: 25 mg/dL — ABNORMAL HIGH (ref 6–20)
Calcium, Ion: 1.1 mmol/L — ABNORMAL LOW (ref 1.15–1.40)
Chloride: 101 mmol/L (ref 98–111)
Creatinine, Ser: 2.1 mg/dL — ABNORMAL HIGH (ref 0.61–1.24)
Glucose, Bld: 192 mg/dL — ABNORMAL HIGH (ref 70–99)
HCT: 37 % — ABNORMAL LOW (ref 39.0–52.0)
Hemoglobin: 12.6 g/dL — ABNORMAL LOW (ref 13.0–17.0)
Potassium: 4.7 mmol/L (ref 3.5–5.1)
Sodium: 137 mmol/L (ref 135–145)
TCO2: 25 mmol/L (ref 22–32)

## 2022-07-13 LAB — CBC
HCT: 39.1 % (ref 39.0–52.0)
Hemoglobin: 13.5 g/dL (ref 13.0–17.0)
MCH: 33.1 pg (ref 26.0–34.0)
MCHC: 34.5 g/dL (ref 30.0–36.0)
MCV: 95.8 fL (ref 80.0–100.0)
Platelets: 136 10*3/uL — ABNORMAL LOW (ref 150–400)
RBC: 4.08 MIL/uL — ABNORMAL LOW (ref 4.22–5.81)
RDW: 12.4 % (ref 11.5–15.5)
WBC: 8.7 10*3/uL (ref 4.0–10.5)
nRBC: 0 % (ref 0.0–0.2)

## 2022-07-13 LAB — BASIC METABOLIC PANEL
Anion gap: 10 (ref 5–15)
BUN: 24 mg/dL — ABNORMAL HIGH (ref 6–20)
CO2: 24 mmol/L (ref 22–32)
Calcium: 9.2 mg/dL (ref 8.9–10.3)
Chloride: 100 mmol/L (ref 98–111)
Creatinine, Ser: 1.92 mg/dL — ABNORMAL HIGH (ref 0.61–1.24)
GFR, Estimated: 39 mL/min — ABNORMAL LOW (ref >60)
Glucose, Bld: 234 mg/dL — ABNORMAL HIGH (ref 70–99)
Potassium: 4.5 mmol/L (ref 3.5–5.1)
Sodium: 134 mmol/L — ABNORMAL LOW (ref 135–145)

## 2022-07-13 LAB — TROPONIN I (HIGH SENSITIVITY): Troponin I (High Sensitivity): 4 ng/L (ref <18)

## 2022-07-13 MED ORDER — SODIUM CHLORIDE 0.9 % IV BOLUS
500.0000 mL | Freq: Once | INTRAVENOUS | Status: AC
  Administered 2022-07-13: 500 mL via INTRAVENOUS

## 2022-07-13 MED ORDER — IOHEXOL 350 MG/ML SOLN
100.0000 mL | Freq: Once | INTRAVENOUS | Status: AC | PRN
  Administered 2022-07-13: 100 mL via INTRAVENOUS

## 2022-07-13 NOTE — ED Triage Notes (Signed)
Pt sts intermittent chest pain ongoing since 12:00 pm. Has left sided chest pain that radiates into the back and right arm. Took 2 ASA prior to arrival. Has extensive cardiac hx. Followed by [facility]

## 2022-07-13 NOTE — ED Provider Notes (Signed)
[place] EMERGENCY DEPARTMENT AT [facility] Provider Note   CSN: [id number] Arrival date & time: 07/13/22  1912     History  Chief Complaint  Patient presents with   Chest Pain    [facility] Zale Boeke. is a 62 y.o. male.  Patient is a 63 year old male with a past medical history of CAD status post CABG, paroxysmal A-fib on Eliquis, diabetes presenting to the emergency department with chest pain.  Patient states that he was lifting heavy object around 1030 this morning and initially felt okay.  He states that around noon he started to develop some left-sided chest pain that felt like a poking sensation into the left side of his chest.  He states then throughout the day he started to develop pain on his right side with some tingling down his right arm which prompted him to come to the emergency department.  He denies any radiation of pain into his abdomen or his back.  He states he had associated nausea but denies any vomiting.  He denies any diaphoresis or shortness of breath.  He states that he did take a tramadol at home with improvement of the pain.  The history is provided by the patient and a relative.  Chest Pain      Home Medications Prior to Admission medications   Medication Sig Start Date End Date Taking? Authorizing Provider  allopurinol (ZYLOPRIM) 100 MG tablet TAKE 1 TABLET BY MOUTH EVERY DAY 12/14/21   Etta Grandchild, MD  apixaban (ELIQUIS) 5 MG TABS tablet Take 1 tablet (5 mg total) by mouth 2 (two) times daily. 12/14/21   Meriam Sprague, MD  ASPIRIN LOW DOSE 81 MG EC tablet Take 81 mg by mouth every 6 (six) hours as needed for moderate pain. 03/13/21   [provider]  carvedilol (COREG) 12.5 MG tablet Take 1 tablet (12.5 mg total) by mouth 2 (two) times daily. 12/18/21   Meriam Sprague, MD  clopidogrel (PLAVIX) 75 MG tablet Take 1 tablet (75 mg total) by mouth daily. 01/05/22   Meriam Sprague, MD  Continuous Blood Gluc  Receiver (FREESTYLE LIBRE 3 READER) DEVI 1 Act by Does not apply route daily. 05/16/22   Etta Grandchild, MD  Continuous Blood Gluc Sensor (FREESTYLE LIBRE 3 SENSOR) MISC 1 Act by Does not apply route daily. Place 1 sensor on the skin every 14 days. Use to check glucose continuously 05/16/22   Etta Grandchild, MD  dapagliflozin propanediol (FARXIGA) 10 MG TABS tablet Take 1 tablet (10 mg total) by mouth daily before breakfast. 04/18/22   Meriam Sprague, MD  Evolocumab (REPATHA SURECLICK) 140 MG/ML SOAJ Inject 140 mg into the skin every 14 (fourteen) days. 04/03/22   Meriam Sprague, MD  ezetimibe (ZETIA) 10 MG tablet Take 1 tablet (10 mg total) by mouth daily. 12/14/21   Meriam Sprague, MD  Fe Bisgly-Vit C-Vit B12-FA (GENTLE IRON) 28-60-0.008-0.4 MG CAPS Take 1 tablet by mouth in the morning.    [provider]  icosapent Ethyl (VASCEPA) 1 g capsule Take 2 capsules (2 g total) by mouth 2 (two) times daily. 12/18/21   Meriam Sprague, MD  insulin glargine, 2 Unit Dial, (TOUJEO MAX SOLOSTAR) 300 UNIT/ML Solostar Pen Inject 20 Units into the skin daily. 05/16/22   Etta Grandchild, MD  Insulin Pen Needle 32G X 6 MM MISC 1 Act by Does not apply route daily. 05/16/22   Etta Grandchild, MD  isosorbide  mononitrate (IMDUR) 30 MG 24 hr tablet Take 1 tablet (30 mg total) by mouth daily. 12/14/21   Meriam Sprague, MD  Magnesium 300 MG CAPS Take 300 mg by mouth daily.    [provider]  Multiple Vitamin (MULTIVITAMIN WITH MINERALS) TABS tablet Take 1 tablet by mouth daily.    [provider]  pantoprazole (PROTONIX) 40 MG tablet Please take 40 mg oral twice daily x30 days, then 40 mg oral daily. 08/18/20   Elgergawy, Leana Roe, MD  sacubitril-valsartan (ENTRESTO) 24-26 MG Take 1 tablet by mouth 2 (two) times daily. 04/13/22   Meriam Sprague, MD  Semaglutide (RYBELSUS) 7 MG TABS Take 1 tablet (7 mg total) by mouth daily. 06/20/22   Etta Grandchild, MD   spironolactone (ALDACTONE) 25 MG tablet Take 0.5 tablets (12.5 mg total) by mouth daily. 04/13/22   Meriam Sprague, MD  traMADol (ULTRAM) 50 MG tablet Take 1 tablet (50 mg total) by mouth every 12 (twelve) hours as needed. 04/27/22   Etta Grandchild, MD  vitamin B-12 (CYANOCOBALAMIN) 1000 MCG tablet Take 1,000 mcg by mouth in the morning.    [provider]      Allergies    Atorvastatin, Crestor [rosuvastatin], Metformin and related, and Propofol    Review of Systems   Review of Systems  Cardiovascular:  Positive for chest pain.    Physical Exam Updated Vital Signs BP 116/80   Pulse 63   Temp 97.8 F (36.6 C)   Resp 20   Ht 5\' 8"  (1.727 m)   Wt 78.5 kg   SpO2 100%   BMI 26.30 kg/m  Physical Exam Vitals and nursing note reviewed.  Constitutional:      General: He is not in acute distress.    Appearance: He is well-developed.  HENT:     Head: Normocephalic and atraumatic.  Eyes:     Extraocular Movements: Extraocular movements intact.  Neck:     Comments: Negative Spurling's test Cardiovascular:     Rate and Rhythm: Normal rate and regular rhythm.     Pulses:          Radial pulses are 2+ on the right side and 2+ on the left side.       Dorsalis pedis pulses are 2+ on the right side and 2+ on the left side.     Heart sounds: Normal heart sounds.  Pulmonary:     Effort: Pulmonary effort is normal.  Chest:     Chest wall: No tenderness.  Abdominal:     Palpations: Abdomen is soft.     Tenderness: There is no abdominal tenderness.  Musculoskeletal:        General: Normal range of motion.     Cervical back: Normal range of motion and neck supple.     Right lower leg: No edema.     Left lower leg: No edema.  Skin:    General: Skin is warm and dry.  Neurological:     General: No focal deficit present.     Mental Status: He is alert and oriented to person, place, and time.  Psychiatric:        Mood and Affect: Mood normal.        Behavior: Behavior  normal.     ED Results / Procedures / Treatments   Labs (all labs ordered are listed, but only abnormal results are displayed) Labs Reviewed  BASIC METABOLIC PANEL - Abnormal; Notable for the following components:  Result Value   Sodium 134 (*)    Glucose, Bld 234 (*)    BUN 24 (*)    Creatinine, Ser 1.92 (*)    GFR, Estimated 39 (*)    All other components within normal limits  CBC - Abnormal; Notable for the following components:   RBC 4.08 (*)    Platelets 136 (*)    All other components within normal limits  I-STAT CHEM 8, ED - Abnormal; Notable for the following components:   BUN 25 (*)    Creatinine, Ser 2.10 (*)    Glucose, Bld 192 (*)    Calcium, Ion 1.10 (*)    Hemoglobin 12.6 (*)    HCT 37.0 (*)    All other components within normal limits  TROPONIN I (HIGH SENSITIVITY)    EKG EKG Interpretation  Date/Time:  Friday Jul 13 2022 21:16:43 EDT Ventricular Rate:  66 PR Interval:  183 QRS Duration: 96 QT Interval:  438 QTC Calculation: 459 R Axis:   33 Text Interpretation: Sinus rhythm Probable lateral infarct, old No significant change since last tracing Confirmed by Elayne Snare ([id number]) on 07/13/2022 9:19:01 PM  Radiology CT Angio Chest/Abd/Pel for Dissection W and/or W/WO  Result Date: 07/13/2022 CLINICAL DATA:  Acute aortic syndrome (AAS) suspected EXAM: CT ANGIOGRAPHY CHEST, ABDOMEN AND PELVIS TECHNIQUE: Non-contrast CT of the chest was initially obtained. Multidetector CT imaging through the chest, abdomen and pelvis was performed using the standard protocol during bolus administration of intravenous contrast. Multiplanar reconstructed images and MIPs were obtained and reviewed to evaluate the vascular anatomy. RADIATION DOSE REDUCTION: This exam was performed according to the departmental dose-optimization program which includes automated exposure control, adjustment of the mA and/or kV according to patient size and/or use of iterative reconstruction  technique. CONTRAST:  OMNIPAQUE IOHEXOL 350 MG/ML SOLN COMPARISON:  08/12/2020 FINDINGS: CTA CHEST FINDINGS Cardiovascular: Prior CABG. Heart is normal size. Aorta is normal caliber. No aortic dissection. Aortic atherosclerosis. No filling defects in the pulmonary arteries to suggest pulmonary emboli. Mediastinum/Nodes: No mediastinal, hilar, or axillary adenopathy. Trachea and esophagus are unremarkable. Thyroid unremarkable. Lungs/Pleura: Lungs are clear. No focal airspace opacities or suspicious nodules. No effusions. Musculoskeletal: Chest wall soft tissues are unremarkable. No acute bony abnormality. Review of the MIP images confirms the above findings. CTA ABDOMEN AND PELVIS FINDINGS VASCULAR Aorta: Normal caliber aorta without aneurysm, dissection, vasculitis or significant stenosis. Scattered aortic atherosclerosis, most pronounced infrarenal. Celiac: Widely patent SMA: Widely patent Renals: Single bilaterally, widely patent IMA: Patent without evidence of aneurysm, dissection, vasculitis or significant stenosis. Inflow: Patent without evidence of aneurysm, dissection, vasculitis or significant stenosis. Veins: No obvious venous abnormality within the limitations of this arterial phase study. Review of the MIP images confirms the above findings. NON-VASCULAR Hepatobiliary: Gallbladder filled with gallstones. No biliary ductal dilatation or focal hepatic abnormality. Pancreas: No focal abnormality or ductal dilatation. Spleen: No focal abnormality.  Normal size. Adrenals/Urinary Tract: No adrenal abnormality. No focal renal abnormality. No stones or hydronephrosis. Urinary bladder is unremarkable. Stomach/Bowel: Colonic diverticulosis diffusely. No active diverticulitis. Normal appendix. Stomach and small bowel decompressed, unremarkable. Lymphatic: No adenopathy Reproductive: No visible focal abnormality. Other: No free fluid or free air. Small left inguinal hernia containing fat. Musculoskeletal: No  acute bony abnormality. Review of the MIP images confirms the above findings. IMPRESSION: No evidence of aortic aneurysm or dissection. Scattered aortic atherosclerosis. No evidence of pulmonary embolus. No acute findings in the chest, abdomen or pelvis. Cholelithiasis. Diffuse colonic diverticulosis. Left inguinal hernia  containing fat. Electronically Signed   By: Charlett Nose M.D.   On: 07/13/2022 21:19   DG Chest 2 View  Result Date: 07/13/2022 CLINICAL DATA:  Chest pain. EXAM: CHEST - 2 VIEW COMPARISON:  October 05, 2021 FINDINGS: Multiple sternal wires are noted. The heart size and mediastinal contours are within normal limits. Both lungs are clear. The visualized skeletal structures are unremarkable. IMPRESSION: 1. Evidence of prior median sternotomy/CABG. 2. No acute cardiopulmonary disease. Electronically Signed   By: Aram Candela M.D.   On: 07/13/2022 19:50    Procedures Procedures    Medications Ordered in ED Medications  sodium chloride 0.9 % bolus 500 mL (has no administration in time range)  iohexol (OMNIPAQUE) 350 MG/ML injection 100 mL (100 mLs Intravenous Contrast Given 07/13/22 2111)    ED Course/ Medical Decision Making/ A&P Clinical Course as of 07/13/22 2157  Fri Jul 13, 2022  2133 Creatinine mildly elevated from baseline with some mild hyperglycemia and he will be given gentle fluids.  Initial troponin is negative and symptoms have been ongoing since this morning so single troponin is sufficient.  CT dissection study without acute abnormality.  Patient will be stable for discharge home after his fluids with outpatient follow-up. [VK]    Clinical Course User Index [VK] Rexford Maus, DO                             Medical Decision Making This patient presents to the ED with chief complaint(s) of chest pain with pertinent past medical history of CAD s/p CABG, DM, A-fib on Eliquis which further complicates the presenting complaint. The complaint involves an  extensive differential diagnosis and also carries with it a high risk of complications and morbidity.    The differential diagnosis includes ACS, arrhythmia, anemia, pneumonia, pneumothorax, pulmonary edema, pleural effusion, considering dissection with or is associated paresthesias in his right arm, muscle strain or spasm though no reproducible pain on exam  Additional history obtained: Additional history obtained from family Records reviewed Primary Care Documents  ED Course and Reassessment: On patient's arrival to the emergency department he was hemodynamically stable in no acute distress.  EKG on arrival showed normal sinus rhythm without acute ischemic changes.  Due to patient's significant cardiac history he will have labs including troponin performed.  With his radiation of pain and concern for possible dissection and he will be a CT dissection study performed.  He declined any additional pain medication at this time and will be closely reassessed.  Independent labs interpretation:  The following labs were independently interpreted: Hyperglycemia without DKA, mild AKI on CKD  Independent visualization of imaging: - I independently visualized the following imaging with scope of interpretation limited to determining acute life threatening conditions related to emergency care: CXR, CT dissetion, which revealed no acute disease  Consultation: - Consulted or discussed management/test interpretation w/ external professional: N/A  Consideration for admission or further workup: Patient has no emergent conditions requiring admission or further work-up at this time and is stable for discharge home with primary care follow-up  Social Determinants of health: N/A    Amount and/or Complexity of Data Reviewed Labs: ordered. Radiology: ordered.  Risk Prescription drug management.          Final Clinical Impression(s) / ED Diagnoses Final diagnoses:  Nonspecific chest pain   Hyperglycemia  AKI (acute kidney injury) (HCC)    Rx / DC Orders ED Discharge Orders  None         Rexford Maus, [place] 07/13/22 2157

## 2022-07-13 NOTE — ED Notes (Signed)
Discharge instructions discussed with pt. Verbalized understanding. VSS. No questions or concerns regarding discharge  

## 2022-07-13 NOTE — Discharge Instructions (Signed)
You were seen in the emergency department for your chest pain.  Your workup showed no signs of heart attack, injury to the main blood vessel in your chest or abnormalities within your lungs.  You may have pulled a muscle in your chest with your lifting today it is unclear what is exactly is causing your pain.  You can continue to take Tylenol and your home tramadol and you can follow-up with your primary doctor to have your symptoms rechecked.  Your kidney function was slightly increased from her baseline so you should have your kidney function rechecked by your primary doctor within the next week and make sure that you are taking all your medications as prescribed and drinking plenty of fluids.  You should return to the emergency department if you have significantly worsening pain, worsening numbness or weakness in your arms or legs, severe shortness of breath, you pass out or if you have any other new or concerning symptoms.

## 2022-07-17 ENCOUNTER — Telehealth: Payer: Self-pay

## 2022-07-17 NOTE — Transitions of Care (Post Inpatient/ED Visit) (Unsigned)
   07/17/2022  Name: Colton Guzman [facility]. MRN: [id number] DOB: 05/09/1960  Today's TOC FU Call Status: Today's TOC FU Call Status:: Unsuccessul Call (1st Attempt) Unsuccessful Call (1st Attempt) Date: 07/17/22  Attempted to reach the patient regarding the most recent Inpatient/ED visit.  Follow Up Plan: Additional outreach attempts will be made to reach the patient to complete the Transitions of Care (Post Inpatient/ED visit) call.   Signature Karena Addison, LPN [facility] Nurse Health Advisor Direct Dial [phone]

## 2022-07-18 NOTE — Transitions of Care (Post Inpatient/ED Visit) (Signed)
07/18/2022  Name: Colton Guzman [facility]. MRN: [id number] DOB: 1961/02/11  Today's TOC FU Call Status: Today's TOC FU Call Status:: Successful TOC FU Call Competed Unsuccessful Call (1st Attempt) Date: 07/17/22 [facility] FU Call Complete Date: 07/18/22  Transition Care Management Follow-up Telephone Call Date of Discharge: 07/13/22 Discharge Facility: [facility]) Type of Discharge: Emergency Department Reason for ED Visit: Other: (chest pain) How have you been since you were released from the hospital?: Better Any questions or concerns?: No  Items Reviewed: Did you receive and understand the discharge instructions provided?: Yes Medications obtained,verified, and reconciled?: Yes (Medications Reviewed) Any new allergies since your discharge?: No Dietary orders reviewed?: Yes Do you have support at home?: Yes People in Home: significant other  Medications Reviewed Today: Medications Reviewed Today     Reviewed by Karena Addison, LPN (Licensed Practical Nurse) on 07/18/22 at 1552  Med List Status: <None>   Medication Order Taking? Sig Documenting Provider Last Dose Status Informant  allopurinol (ZYLOPRIM) 100 MG tablet [id number] Yes TAKE 1 TABLET BY MOUTH EVERY DAY Etta Grandchild, MD Taking Active   apixaban (ELIQUIS) 5 MG TABS tablet [id number] Yes Take 1 tablet (5 mg total) by mouth 2 (two) times daily. Meriam Sprague, MD Taking Active   ASPIRIN LOW DOSE 81 MG EC tablet [id number] Yes Take 81 mg by mouth every 6 (six) hours as needed for moderate pain. [provider] Taking Active Self           Med Note Elnora Morrison, MCKENZIE A   Tue May 16, 2021  2:15 PM)    carvedilol (COREG) 12.5 MG tablet [id number] Yes Take 1 tablet (12.5 mg total) by mouth 2 (two) times daily. Meriam Sprague, MD Taking Active   clopidogrel (PLAVIX) 75 MG tablet [id number] Yes Take 1 tablet (75 mg total) by mouth daily. Meriam Sprague, MD Taking Active   Continuous Blood Gluc  Receiver (FREESTYLE LIBRE 3 READER) DEVI [id number] Yes 1 Act by Does not apply route daily. Etta Grandchild, MD Taking Active   Continuous Blood Gluc Sensor (FREESTYLE LIBRE 3 SENSOR) [place] [id number] Yes 1 Act by Does not apply route daily. Place 1 sensor on the skin every 14 days. Use to check glucose continuously Etta Grandchild, MD Taking Active   dapagliflozin propanediol (FARXIGA) 10 MG TABS tablet [id number] Yes Take 1 tablet (10 mg total) by mouth daily before breakfast. Meriam Sprague, MD Taking Active   Evolocumab [facility] SURECLICK) 140 MG/ML [place]ory Broad [id number] Yes Inject 140 mg into the skin every 14 (fourteen) days. Meriam Sprague, MD Taking Active   ezetimibe (ZETIA) 10 MG tablet [id number] Yes Take 1 tablet (10 mg total) by mouth daily. Meriam Sprague, MD Taking Active   Fe Bisgly-Vit C-Vit B12-FA (GENTLE IRON) 28-60-0.008-0.4 MG CAPS [id number] Yes Take 1 tablet by mouth in the morning. [provider] Taking Active Self  icosapent Ethyl (VASCEPA) 1 g capsule [id number] Yes Take 2 capsules (2 g total) by mouth 2 (two) times daily. Meriam Sprague, MD Taking Active   insulin glargine, 2 Unit Dial, (TOUJEO MAX SOLOSTAR) 300 UNIT/ML Solostar Pen [id number]  Inject 20 Units into the skin daily. Etta Grandchild, MD  Active   Insulin Pen Needle 32G X 6 MM MISC [id number]  1 Act by Does not apply route daily. Etta Grandchild, MD  Active   isosorbide mononitrate (IMDUR) 30 MG 24 hr tablet [id number] Yes Take 1 tablet (30 mg total) by mouth  daily. Meriam Sprague, MD Taking Active   Magnesium 300 MG CAPS [id number] Yes Take 300 mg by mouth daily. [provider] Taking Active Self  Multiple Vitamin (MULTIVITAMIN WITH MINERALS) TABS tablet [id number] Yes Take 1 tablet by mouth daily. [provider] Taking Active Self  pantoprazole (PROTONIX) 40 MG tablet [id number] Yes Please take 40 mg oral twice daily x30 days, then 40 mg oral daily. Elgergawy, Leana Roe, MD  Taking Active Self  sacubitril-valsartan (ENTRESTO) 24-26 MG [id number] Yes Take 1 tablet by mouth 2 (two) times daily. Meriam Sprague, MD Taking Active   Semaglutide [facility]) 7 MG TABS [id number] Yes Take 1 tablet (7 mg total) by mouth daily. Etta Grandchild, MD Taking Active   spironolactone (ALDACTONE) 25 MG tablet [id number] Yes Take 0.5 tablets (12.5 mg total) by mouth daily. Meriam Sprague, MD Taking Active   traMADol Janean Sark) 50 MG tablet [id number] Yes Take 1 tablet (50 mg total) by mouth every 12 (twelve) hours as needed. Etta Grandchild, MD Taking Active   vitamin B-12 (CYANOCOBALAMIN) 1000 MCG tablet [id number] Yes Take 1,000 mcg by mouth in the morning. [provider] Taking Active Self            Home Care and Equipment/Supplies: Were Home Health Services Ordered?: NA Any new equipment or medical supplies ordered?: NA  Functional Questionnaire: Do you need assistance with bathing/showering or dressing?: No Do you need assistance with meal preparation?: No Do you need assistance with eating?: No Do you have difficulty maintaining continence: No Do you need assistance with getting out of bed/getting out of a chair/moving?: No Do you have difficulty managing or taking your medications?: No  Follow up appointments reviewed: PCP Follow-up appointment confirmed?: Yes Date of PCP follow-up appointment?: 07/31/22 Follow-up Provider: Dr Yetta Bar[facility] Follow-up appointment confirmed?: NA Do you need transportation to your follow-up appointment?: No Do you understand care options if your condition(s) worsen?: Yes-patient verbalized understanding    SIGNATURE Karena Addison, LPN [facility] Nurse Health Advisor Direct Dial [phone]

## 2022-07-20 ENCOUNTER — Telehealth: Payer: Self-pay | Admitting: Internal Medicine

## 2022-07-20 ENCOUNTER — Other Ambulatory Visit: Payer: Self-pay | Admitting: Internal Medicine

## 2022-07-20 ENCOUNTER — Other Ambulatory Visit (HOSPITAL_COMMUNITY): Payer: Self-pay

## 2022-07-20 ENCOUNTER — Encounter: Payer: Self-pay | Admitting: Family

## 2022-07-20 DIAGNOSIS — M159 Polyosteoarthritis, unspecified: Secondary | ICD-10-CM

## 2022-07-20 DIAGNOSIS — M150 Primary generalized (osteo)arthritis: Secondary | ICD-10-CM

## 2022-07-20 MED ORDER — METFORMIN HCL ER 750 MG PO TB24
750.0000 mg | ORAL_TABLET | Freq: Every day | ORAL | 0 refills | Status: DC
  Filled 2022-07-20: qty 30, 30d supply, fill #0

## 2022-07-20 MED ORDER — TRAMADOL HCL 50 MG PO TABS
50.0000 mg | ORAL_TABLET | Freq: Two times a day (BID) | ORAL | 0 refills | Status: DC | PRN
Start: 2022-07-20 — End: 2022-07-31

## 2022-07-20 MED ORDER — FREESTYLE LIBRE 3 READER DEVI
3 refills | Status: DC
  Filled 2022-07-20: qty 1, 90d supply, fill #0
  Filled 2022-08-31: qty 1, 15d supply, fill #0
  Filled 2022-09-25 – 2022-10-02 (×2): qty 1, 30d supply, fill #0

## 2022-07-20 NOTE — Telephone Encounter (Signed)
Prescription Request  07/20/2022  LOV: 05/15/2022  What is the name of the medication or equipment? traMADol (ULTRAM) 50 MG tablet   Have you contacted your pharmacy to request a refill? No  [facility] [address] [place], [place] [address] [phone]  Which pharmacy would you like this sent to?  Patient notified that their request is being sent to the clinical staff for review and that they should receive a response within 2 business days.   Please advise at Mobile [phone] (mobile)

## 2022-07-23 ENCOUNTER — Other Ambulatory Visit (HOSPITAL_COMMUNITY): Payer: Self-pay

## 2022-07-23 ENCOUNTER — Other Ambulatory Visit: Payer: Self-pay

## 2022-07-23 NOTE — Progress Notes (Signed)
Cardiology Office Note:    Date:  08/01/2022   ID:  Colton Sprout Adain Geurin., DOB Feb 14, 1961, MRN [id number]  PCP:  Etta Grandchild, MD   [facility]  Cardiologist:  Meriam Sprague, MD  Advanced Practice Provider:  No care team member to display Electrophysiologist:  Lanier Prude, MD   Referring MD: Etta Grandchild, MD    History of Present Illness:    Colton Sprout Angello Chien. is a 62 y.o. male with a hx of DMII, HLD and HTN and recent prolonged admission at [facility] hospital where he presented with hematochezia with course complicated by chest pain/VT arrest requiring CPR later found to have multivessel CAD s/p CABG on 03/08/20 who now presents to clinic for follow-up.   Patient was hospitalized at [facility] from 02/22/20-03/13/20 where he presented with hematochezia from diverticular source. On 02/23/20, the patient developed chest pain in the setting of a blood bowel movement that initially responded to SL NTG but then recurred with subsequent VT arrest requiring 2-3min CPR and shock. Post-shock rhythm Afib. [place] at [id number]. He was stabilized from GIB standpoint and underwent coronary angiography on 03/04/20 which showed 70% stenosis of the proximal RCA with 50% stenosis of the distal RCA, 70% stenosis of the right PDA, 99% stenosis of the third right PL, 99% stenosis of the second right PL, 90% stenosis of the mid LAD with an ostial LAD stenosis of 70%, 100% stenosis of the ostial to proximal circumflex, and 60% stenosis of the mid LM to ostial LAD.  Due to his severe proximal LAD stenosis and severe mid LAD stenosis coronary bypass grafting was recommended. He underwent successful CABG x5 on 03/08/20 with LIMA to distal LAD, SVG to PDA., SVG tp posterior lateral branch of the right coronary artery, left radial artery to the ramus intermediate and first obtuse marginal coronary arteries.  Following the procedure, he separated from cardiopulmonary bypass without  difficulty. He did well and was successfully discharged home on 03/13/20.  Seen by me on 03/28/20 where he was doing well. Obtained cardiac monitor which showed <1% burden of Afib, 3% burden of PVCs/NSVT. Given Afib outside of the post-operative setting, his plavix was stopped and he was placed on apixaban for [facility].   Was hospitalized at [facility] from 08/12/20-08/18/20 with GIB. During admission, he required 6units pRBCs total. His ASA and apixaban were initially held. Colonoscopy revealed severe diverticulosis but no active bleeding. EGD with nonbleeding gastric ulcers. Tagged RBC scan was also negative. Patient overall improved and H/H remained stable with conservative management with PPI. He was resumed on his apixaban which he tolerated and was discharged home.  He was evaluated by Dr. Lalla Brothers for consideration of Watchman however, prewatchman implant CT scan which showed very difficult anatomy with a very shallow landing zone for the watchman device. After three-dimensional reconstruction of the appendage, he was deemed a poor Watchman candidate. May be considered for amulet in the future.   Presented to [facility] in 09/2021 after witnessed cardiac arrest. Initial rhythm was reportedly Vfib. Had 1 round of CPR with 1 shock with successful ROSC. Was taken for cath on 09/27/21 where he underwent DES to D1. Bedside ultrasound demonstrated severely reduced EF 10% and he was subsequently taken from impella placement. Course was further complicated by cardiogenic shock, renal failure requiring HD, embolic stroke, and anemia from blood loss at impella site requiring multiple transfusions. Notably, he was maintained on apixaban, ASA and ticagrelor.   Seen  in clinic on 12/18/21 where he was much better. We resumed his plavix. We made several medication changes at that time. Renal function had significantly improved with Cr 3.2>1.34.  Seen in clinic on 12/2021. He was doing well at that visit. We stopped  amiodarone and added entresto at that time.   Was last seen in clinic on 03/2022 where he was doing well from a CV standpoint.  Today, the patient states he was seen in the ER for chest discomfort after lifting a heavy object. Work-up was reassuring with normal trop normal. ECG with NSR with no acute ischemic changes. Symptoms thought to be related to MSK and he was discharged home. Currently, the patient is comfortable with no chest pain or exertional symptoms. No SOB, LE edema, orthopnea, PND or palpitations. A1C remains evelated at 8.7. His PCP just adjusted his lantus and his BG have improved. Remains active and is doing about of activity 3 days per week.    Past Medical History:  Diagnosis Date   Anemia    hx iron transfusion   Arthritis    left shoulder   CAD (coronary artery disease) of artery bypass graft 02/2020   Diabetes mellitus without complication (HCC)    type 2   Elevated blood uric acid level    GI bleed    History of blood transfusion 08/19/2020   Hyperlipidemia LDL goal <70    Hypertension    PAF (paroxysmal atrial fibrillation) (HCC)    Wears partial dentures    upper dent/lower partial    Past Surgical History:  Procedure Laterality Date   BIOPSY  08/17/2020   Procedure: BIOPSY;  Surgeon: Napoleon Form, MD;  Location: [facility] ENDOSCOPY;  Service: Endoscopy;;   COLONOSCOPY WITH PROPOFOL Left 02/27/2020   Procedure: COLONOSCOPY WITH PROPOFOL;  Surgeon: Kathi Der, MD;  Location: [facility] ENDOSCOPY;  Service: Gastroenterology;  Laterality: Left;   COLONOSCOPY WITH PROPOFOL N/A 08/13/2020   Procedure: COLONOSCOPY WITH PROPOFOL;  Surgeon: Iva Boop, MD;  Location: [facility] ENDOSCOPY;  Service: Endoscopy;  Laterality: N/A;   CORONARY ARTERY BYPASS GRAFT N/A 03/08/2020   Procedure: CORONARY ARTERY BYPASS GRAFTING (CABG) x 5 ON PUMP;  Surgeon: Linden Dolin, MD;  Location: [facility] OR;  Service: Open Heart Surgery;  Laterality: N/A;   ENDOVEIN HARVEST OF GREATER  SAPHENOUS VEIN Right 03/08/2020   Procedure: ENDOVEIN HARVEST OF GREATER SAPHENOUS VEIN;  Surgeon: Linden Dolin, MD;  Location: [facility] OR;  Service: Open Heart Surgery;  Laterality: Right;   ESOPHAGOGASTRODUODENOSCOPY (EGD) WITH PROPOFOL N/A 02/27/2020   Procedure: ESOPHAGOGASTRODUODENOSCOPY (EGD) WITH PROPOFOL;  Surgeon: Kathi Der, MD;  Location: [facility] ENDOSCOPY;  Service: Gastroenterology;  Laterality: N/A;   ESOPHAGOGASTRODUODENOSCOPY (EGD) WITH PROPOFOL N/A 08/17/2020   Procedure: ESOPHAGOGASTRODUODENOSCOPY (EGD) WITH PROPOFOL;  Surgeon: Napoleon Form, MD;  Location: [facility] ENDOSCOPY;  Service: Endoscopy;  Laterality: N/A;   INSERTION OF MESH Left 05/09/2021   Procedure: INSERTION OF MESH;  Surgeon: Axel Filler, MD;  Location: [facility] OR;  Service: General;  Laterality: Left;   LEFT HEART CATH AND CORONARY ANGIOGRAPHY N/A 03/04/2020   Procedure: LEFT HEART CATH AND CORONARY ANGIOGRAPHY;  Surgeon: [name], MD;  Location: [facility] INVASIVE CV LAB;  Service: Cardiovascular;  Laterality: N/A;   RADIAL ARTERY HARVEST Left 03/08/2020   Procedure: RADIAL ARTERY HARVEST;  Surgeon: Linden Dolin, MD;  Location: [facility] OR;  Service: Open Heart Surgery;  Laterality: Left;   TEE WITHOUT CARDIOVERSION N/A 03/08/2020   Procedure: TRANSESOPHAGEAL ECHOCARDIOGRAM (TEE);  Surgeon: Linden Dolin, MD;  Location: [facility] OR;  Service: Open Heart Surgery;  Laterality: N/A;   XI ROBOTIC ASSISTED INGUINAL HERNIA REPAIR WITH MESH Left 05/09/2021   Procedure: XI ROBOTIC ASSISTED LEFT INGUINAL HERNIA REPAIR WITH MESH;  Surgeon: Axel Filler, MD;  Location: [facility] OR;  Service: General;  Laterality: Left;    Current Medications: Current Meds  Medication Sig   allopurinol (ZYLOPRIM) 100 MG tablet TAKE 1 TABLET BY MOUTH EVERY DAY   apixaban (ELIQUIS) 5 MG TABS tablet Take 1 tablet (5 mg total) by mouth 2 (two) times daily.   ASPIRIN LOW DOSE 81 MG EC tablet Take 81 mg by mouth every 6 (six) hours as needed for  moderate pain.   carvedilol (COREG) 12.5 MG tablet Take 1 tablet (12.5 mg total) by mouth 2 (two) times daily.   clopidogrel (PLAVIX) 75 MG tablet Take 1 tablet (75 mg total) by mouth daily.   Continuous Blood Gluc Receiver (FREESTYLE LIBRE 3 READER) DEVI 1 Act by Does not apply route daily.   Continuous Glucose Receiver (FREESTYLE LIBRE 3 READER) DEVI Use as directed   Continuous Glucose Sensor (FREESTYLE LIBRE 3 SENSOR) MISC Place 1 sensor on the skin every 14 days. Use to check glucose continuously   Evolocumab (REPATHA SURECLICK) 140 MG/ML SOAJ Inject 140 mg into the skin every 14 (fourteen) days.   ezetimibe (ZETIA) 10 MG tablet Take 1 tablet (10 mg total) by mouth daily.   FARXIGA 10 MG TABS tablet Take 1 tablet (10 mg total) by mouth daily before breakfast.   Fe Bisgly-Vit C-Vit B12-FA (GENTLE IRON) 28-60-0.008-0.4 MG CAPS Take 1 tablet by mouth in the morning.   icosapent Ethyl (VASCEPA) 1 g capsule Take 2 capsules (2 g total) by mouth 2 (two) times daily.   isosorbide mononitrate (IMDUR) 30 MG 24 hr tablet Take 1 tablet (30 mg total) by mouth daily.   Magnesium 300 MG CAPS Take 300 mg by mouth daily.   metFORMIN (GLUCOPHAGE-XR) 750 MG 24 hr tablet Take 1 tablet (750 mg total) by mouth daily with breakfast.   Multiple Vitamin (MULTIVITAMIN WITH MINERALS) TABS tablet Take 1 tablet by mouth daily.   pantoprazole (PROTONIX) 40 MG tablet Take 1 tablet (40 mg total) 2 (two) times daily for 30 days, THEN 1 tablet (40 mg total) daily.   Semaglutide (RYBELSUS) 7 MG TABS Take 1 tablet (7 mg total) by mouth daily.   spironolactone (ALDACTONE) 25 MG tablet Take 1/2 tablet (12.5 mg total) by mouth daily.   traMADol (ULTRAM-ER) 100 MG 24 hr tablet Take 1 tablet (100 mg total) by mouth daily as needed for pain.   vitamin B-12 (CYANOCOBALAMIN) 1000 MCG tablet Take 1,000 mcg by mouth in the morning.   [DISCONTINUED] sacubitril-valsartan (ENTRESTO) 24-26 MG Take 1 tablet by mouth 2 (two) times daily.      Allergies:   Atorvastatin, Crestor [rosuvastatin], Metformin and related, and Propofol   Social History   Socioeconomic History   Marital status: Married    Spouse name: Not on file   Number of children: Not on file   Years of education: Not on file   Highest education level: Bachelor's degree (e.g., BA, AB, BS)  Occupational History   Not on file  Tobacco Use   Smoking status: Former    Packs/day: 1.5    Types: Cigarettes    Quit date: 2012    Years since quitting: 12.4   Smokeless tobacco: Never  Vaping Use   Vaping Use:  Former   Quit date: 08/28/2010  Substance and Sexual Activity   Alcohol use: Not Currently    Comment: stopped drinking 02/2020   Drug use: Yes    Types: Marijuana    Comment: over 30 yrs ago as of 05/03/21   Sexual activity: Not Currently  Other Topics Concern   Not on file  Social History Narrative   Not on file   Social Determinants of Health   Financial Resource Strain: Medium Risk (05/11/2022)   Overall Financial Resource Strain (CARDIA)    Difficulty of Paying Living Expenses: Somewhat hard  Food Insecurity: Food Insecurity Present (05/11/2022)   Hunger Vital Sign    Worried About Running Out of Food in the Last Year: Sometimes true    Ran Out of Food in the Last Year: Sometimes true  Transportation Needs: No Transportation Needs (05/11/2022)   PRAPARE - Administrator, Civil Service (Medical): No    Lack of Transportation (Non-Medical): No  Physical Activity: Insufficiently Active (05/11/2022)   Exercise Vital Sign    Days of Exercise per Week: 3 days    Minutes of Exercise per Session: 30 min  Stress: Stress Concern Present (05/11/2022)   [employer] of Occupational Health - Occupational Stress Questionnaire    Feeling of Stress : To some extent  Social Connections: Moderately Integrated (05/11/2022)   Social Connection and Isolation Panel [NHANES]    Frequency of Communication with Friends and Family: Once a week     Frequency of Social Gatherings with Friends and Family: Once a week    Attends Religious Services: More than 4 times per year    Active Member of [employer] or Organizations: Yes    Attends Banker Meetings: 1 to 4 times per year    Marital Status: Married     Family History: The patient's family history is not on file.  ROS:   Please see the history of present illness.    As per HPI  EKGs/Labs/Other Studies Reviewed:    The following studies were reviewed today:  TTE 10/02/21 ([facility]): SUMMARY  The left ventricular size is normal.  Left ventricular systolic function is mildly reduced.  LV ejection fraction = 45%.  There are regional wall motion abnormalities as specified below.  There is hypokinesis of the inferolateral and mid to distal  anterolateral/lateral wall.  The right ventricle is borderline dilated.  Right ventricular function cannot be assessed due to poor image  quality.  The aortic sinus is normal size.  The inferior vena cava was not visualized during the exam.  There is no pericardial effusion.  Compared to prior study, Impella CP device has been removed.   -  FINDINGS:  LEFT VENTRICLE  The left ventricular size is normal. There is normal left ventricular  wall thickness. Left ventricular systolic function is mildly reduced.  LV ejection fraction = 45%. Left ventricular filling pattern is  indeterminate. There are regional wall motion abnormalities as  specified below. There is hypokinesis of the inferolateral and mid to  distal anterolateral/lateral wall.   -  RIGHT VENTRICLE  The right ventricle is borderline dilated. Right ventricular function  cannot be assessed due to poor image quality.   LEFT ATRIUM  The left atrial size is normal.   RIGHT ATRIUM  Right atrial size is normal.  -  AORTIC VALVE  Aortic valve calcification. There is no aortic stenosis. There is  trace aortic regurgitation.  -  MITRAL VALVE  The  mitral  valve leaflets appear normal. There is trace mitral  regurgitation.  -  TRICUSPID VALVE  Structurally normal tricuspid valve. There is trace tricuspid  regurgitation. There was insufficient TR detected to calculate RV  systolic pressure.  -  PULMONIC VALVE  Limited study, not performed.  -  ARTERIES  The aortic sinus is normal size.  -  VENOUS  The inferior vena cava was not visualized during the exam.  -  EFFUSION  There is no pericardial effusion.    TTE 09/28/21 ([facility]): FINDINGS:  LEFT VENTRICLE  No LV thrombus seen.  There is an Impella LVAD in appropriate position approximately 3.2 cm  inside the AV. Left ventricular systolic function is moderate to  severely reduced. LV ejection fraction = 30-35%.    Cath 09/27/21 ([facility]): PROCEDURE:  Left heart cath, Coronary angiography, IMA angiography, SVG angiography and PCI   DIAGNOSTIC FINDINGS:    1.  Subtotal occlusion of the ramus/high first diagonal coronary artery.    This is presumed to be the culprit vessel.  2.  Patent LIMA-LAD bypass graft.  3.  Patent SVG-PDA bypass graft.  4.  Patent radial bypass graft to OM2-OM 3 (jump graft)  5.  Chronically occluded vein graft to the right coronary artery system  (probably unnecessary given patency of the native RCA)  6.  Normal LVEDP, 9 mmHg  7.  A right radial arterial monitoring line was placed.   Due to hypotension, the patient was started an infusion of phenylephrine.   PCI:    Successful angioplasty and stenting of the ramus/high diagonal coronary  artery with a 2.75 x 28 mm Xience drug-eluting stent.   COMPLICATIONS:  None   RECOMMENDATIONS:   Dual antiplatelet therapy for at least 12 months with no elective  interruptions for the first 3 months.   Consultation with a cardiac electrophysiology service for consideration  of a permanent implantable defibrillator.   Aggressive CVD event prevention with lifestyle changes and medical   therapy.   Will assess and replete electrolytes.   I will add amiodarone in hopes of preventing future VT/VF events.   Consultation with critical care service for management of the  ventilator/respiratory failure.   CT Cardiac Morph 12/22/20 IMPRESSION: 1. The left atrial appendage is a small chicken wing morphology without thrombus.   2. A 20 mm Watchman FLX device is recommended based on the average diameter of the landing zone (17.4 mm, 13% compression) due to is oval shape. The appendage is small and maximum diameter (13.2 mm) will likely preclude a 24 mm device. Would recommend discussion with the structural heart team.   3. There is no thrombus in the left atrial appendage.   4. A mid and mid IAS puncture site is recommended.   5. Optimal deployment angle: RAO 20 CRA 24   6. Normal coronary origin. Right dominance.   7. S/p CABG. A LIMA-LAD and graft-diagonal with jump to the OM are seen. The grafts to the PDA/PLV appear occluded. Native coronary assessment not performed due to lack of NTG and heavy calcification.  IMPRESSION: No acute or significant extracardiac abnormality.  Echo 10/25/20 1. Left ventricular ejection fraction, by estimation, is 60 to 65%. Left  ventricular ejection fraction by 3D volume is 60 %. The left ventricle has  normal function. The left ventricle demonstrates regional wall motion  abnormalities (see scoring  diagram/findings for description). Left ventricular diastolic parameters  are consistent with Grade I diastolic dysfunction (impaired relaxation).  There is mild hypokinesis of the left ventricular, mid-apical anteroseptal  wall and anterior wall.   2. Abnormal RV free wall strain at -17%. Right ventricular systolic  function is normal. The right ventricular size is normal. There is normal  pulmonary artery systolic pressure. The estimated right ventricular  systolic pressure is 28.2 mmHg.   3. Normal LA strain.   4. The mitral  valve is grossly normal. Trivial mitral valve  regurgitation.   5. The aortic valve is tricuspid. Aortic valve regurgitation is trivial.  Mild aortic valve sclerosis is present, with no evidence of aortic valve  stenosis.   6. The inferior vena cava is normal in size with <50% respiratory  variability, suggesting right atrial pressure of 8 mmHg.   Comparison(s): Changes from prior study are noted. 02/25/2020: LVEF 50-55%,  LAD territory hypokinesis, RVSP 49 mmHG.   CT Abdomen 08/12/20 IMPRESSION: VASCULAR   1. No evidence for active GI bleeding. 2. Mild atherosclerotic disease in the abdominal aorta. Aortic Atherosclerosis (ICD10-I70.0). 3. Main visceral arteries are patent.   NON-VASCULAR   1. No acute abnormalities in the abdomen or pelvis. 2. Again noted is a large left inguinal hernia containing a large portion of the sigmoid colon. No acute inflammatory changes associated with this hernia and no evidence for bowel obstruction. 3. Colonic diverticulosis. 4. Cholelithiasis. 5. Hepatic steatosis.  Cardiac Monitor 03/28/20: Patient monitoring period was from 03/28/20-04/26/20 Overall burden of Afib was <1% (1 hour and 31 minutes in total). HR ranged in Afib from 93-157bpm Predominant rhythm was NSR with average HR 93bpm (range from 65bpm to 171bpm) Occasional ventricular ectopy (both PVCs and NSVT) with 3% burden; no sustained VT Rare SVE <1% No signficant pauses or sustained ventricular arrhythmias  Echocardiogram 02/25/2020: Impressions: 1. LVEF 50-55% with wall motion abnormalities suspicious for mid LAD. No  apical thrombus.   2. Left ventricular ejection fraction, by estimation, is 50 to 55%. The  left ventricle has low normal function. The left ventricle demonstrates  regional wall motion abnormalities (see scoring diagram/findings for  description). Left ventricular diastolic   parameters are consistent with Grade II diastolic dysfunction  (pseudonormalization). Elevated  left atrial pressure.   3. Right ventricular systolic function is normal. The right ventricular  size is normal. There is moderately elevated pulmonary artery systolic  pressure. The estimated right ventricular systolic pressure is 48.7 mmHg.   4. Left atrial size was mildly dilated.   5. The mitral valve is normal in structure. Moderate mitral valve  regurgitation. No evidence of mitral stenosis.   6. The aortic valve is normal in structure. Aortic valve regurgitation is  not visualized. Mild to moderate aortic valve sclerosis/calcification is  present, without any evidence of aortic stenosis.   7. The inferior vena cava is normal in size with greater than 50%  respiratory variability, suggesting right atrial pressure of 3 mmHg.      Diagnostic cath 03/04/20 Dominance: Right       EKG:   EKG is personally reviewed. 01/18/22: SB, septal infarct pattern HR 55 12/18/2021:  Sinus rhythm. Rate 60 bpm. Septal infarct. 05/17/2020: NSR, septal q waves, isolated PVC, HR 92  Recent Labs: 05/15/2022: TSH 2.28 06/11/2022: ALT 48 07/13/2022: BUN 25; Creatinine, Ser 2.10; Potassium 4.7; Sodium 137 07/31/2022: Hemoglobin 13.3; Platelets 138.0   Recent Lipid Panel    Component Value Date/Time   CHOL 88 (L) 06/11/2022 0830   TRIG 175 (H) 06/11/2022 0830   HDL 31 (L) 06/11/2022 0830   CHOLHDL  2.8 06/11/2022 0830   CHOLHDL 4 07/26/2020 1144   VLDL 53.6 (H) 07/26/2020 1144   LDLCALC 28 06/11/2022 0830   LDLDIRECT 93.0 07/26/2020 1144     Risk Assessment/Calculations:    CHA2DS2-VASc Score = 6  This indicates a 9.7% annual risk of stroke. The patient's score is based upon: CHF History: 1 HTN History: 1 Diabetes History: 1 Stroke History: 2 Vascular Disease History: 1 Age Score: 0 Gender Score: 0    Physical Exam:    VS:  BP 118/86   Pulse 72   Ht 5\' 7"  (1.702 m)   Wt 178 lb 6.4 oz (80.9 kg)   SpO2 95%   BMI 27.94 kg/m     Wt Readings from Last 3 Encounters:  08/01/22 178  lb 6.4 oz (80.9 kg)  07/31/22 178 lb (80.7 kg)  07/13/22 173 lb (78.5 kg)     GEN:  NAD, well appearing HEENT: Normal NECK: No JVD; No carotid bruits CARDIAC: RRR, 2/6 systolic murmur. No rubs or gallops RESPIRATORY:  Clear to auscultation bilaterally, no wheezes, rales, or rhonchi ABDOMEN: Soft, non-tender, non-distended MUSCULOSKELETAL:  No edema; No deformity  SKIN: Warm and dry NEUROLOGIC:  Alert and oriented x 3 PSYCHIATRIC:  Normal affect   ASSESSMENT:    1. Coronary artery disease involving native coronary artery of native heart without angina pectoris   2. H/O five vessel coronary artery bypass   3. PAF (paroxysmal atrial fibrillation) (HCC)   4. Statin intolerance   5. Mixed hyperlipidemia   6. Chronic systolic heart failure (HCC)   7. Primary hypertension   8. Acute on chronic systolic heart failure (HCC)   9. Dyslipidemia, goal LDL below 70   10. Medication management   11. Coronary artery disease involving coronary bypass graft of native heart without angina pectoris     PLAN:    In order of problems listed above:  #Multivessel CAD #VT Arrest #NSTEMI: Patient presented to [facility] hospital on 02/2020 with hematochezia with course complicated by chest pain with VT arrest on 02/23/20 requiring shock and 2-66min of CPR with return of ROSC. High sensitivity troponin >15,000. EKG with Q waves in V1-V2 and as well as slight ST depression in V4-V6 and lead II. TTE with LVEF of 50-55% with hypokinesis of mid and distal anterior wall, mid and distal anterior septum, and apex as well as grade 2 diastolic dysfunction. Moderate MR and moderately elevated PASP of 48.7 mmHg. He was stabilized from GIB standpoint and underwent coronary angiography found to have multivessel CAD now s/p CABG on 03/08/20. With LIMA -> LAD, LRA -> Ramus -> OM, SVG -> PDA,  SVG -> PLB of RCA. Readmitted to [facility] with out of hospital arrest found to be in Vfib. Received CPR with shocks x1 with ROSC. Cath with  subtotal occlusion of ramus and D1. Bypass grafts patent. Course complicated by cardiogenic shock requiring pressor and impella placement. EF dropped to 10% but improved to 45% prior to discharge. Currently, doing much better without recurrence of anginal symptoms. -Continue plavix 75mg  daily -Lipitor stopped due to rising LFTs; now on zetia 10mg  and repatha 140mg  q2 weeks -Continue vascepa 2g BID -Continue coreg 12.5mg  BID  #Chronic Systolic HF: EF dropped as low as 10% in the setting of cardiac arrest/cardiogenic shock as detailed above. Improved to 45% after revasscularization in 09/2021. Repeat TTE 06/2022 with improvement of EF 50-55% on GDMT. Currently, euvolemic and compensated on exam with NYHA class II symptoms. -Continue entresto 24/26mg  BID -Continue  farxiga 10mg  daily -Continue coreg 12.5mg  BID -Continue spironolactone 12.5mg  daily -Low Na diet   #Paroxysmal Afib: CHADs-vasc 3. Cardiac monitor with episodes of sustained Afib (<1% burden) . LAA not amenable for Watchman. Now maintained on apixaban. -Off amiodarone -Continue eliquis 5mg  BID -Continue coreg 12.5mg  BID -Deemed not to be a good watchman candidate due to unfavorable LAA morphology  #HTN: Well controlled at home mainly 120s. -Continue entresto 24/26mg  BID -Continue coreg 12.5mg  BID -Continue imdur 30mg  daily -Continue spironolactone 12.5mg  daily -BMET next week   #HLD: -Lipitor stopped due to rising LFTs; now on zetia 10mg  and repatha 140mg  q2 weeks -Continue vascepa 2g BID -Goal LDL <55  #Recurrent GIB: Had initialy GIB during hospitalization in 02/2020 thought to be diverticular in nature. Recurrent GIB in 07/2020 as detailed above with relatively unrevealing work-up (? Diverticular vs gastric ulcer). -Continue PPI -High risk for re-bleed given need for apixaban and plavix   #DMII:  -Management per PCP  #History of Acute Renal Failure now with CKD III: Developed acute renal failure during  hospitalization 09/2021 requiring HD.Cr now significantly improved. Will monitor.    Medication Adjustments/Labs and Tests Ordered: Current medicines are reviewed at length with the patient today.  Concerns regarding medicines are outlined above.   No orders of the defined types were placed in this encounter.  Meds ordered this encounter  Medications   sacubitril-valsartan (ENTRESTO) 24-26 MG    Sig: Take 1 tablet by mouth 2 (two) times daily.    Dispense:  180 tablet    Refill:  3   Patient Instructions  Medication Instructions:   Your physician recommends that you continue on your current medications as directed. Please refer to the Current Medication list given to you today.  *If you need a refill on your cardiac medications before your next appointment, please call your pharmacy*    Follow-Up: At [facility], you and your health needs are our priority.  As part of our continuing mission to provide you with exceptional heart care, we have created designated Provider Care Teams.  These Care Teams include your primary Cardiologist (physician) and Advanced Practice Providers (APPs -  Physician Assistants and Nurse Practitioners) who all work together to provide you with the care you need, when you need it.  We recommend signing up for the patient portal called "MyChart".  Sign up information is provided on this After Visit Summary.  MyChart is used to connect with patients for Virtual Visits (Telemedicine).  Patients are able to view lab/test results, encounter notes, upcoming appointments, etc.  Non-urgent messages can be sent to your provider as well.   To learn more about what you can do with MyChart, go to [email or web address].    Your next appointment:   6 month(s)  Provider:   DR. Verne Carrow     Signed, Meriam Sprague, MD  08/01/2022 9:57 AM    [facility]

## 2022-07-24 ENCOUNTER — Other Ambulatory Visit (HOSPITAL_COMMUNITY): Payer: Self-pay

## 2022-07-25 ENCOUNTER — Ambulatory Visit: Payer: Managed Care, Other (non HMO) | Admitting: Registered"

## 2022-07-31 ENCOUNTER — Other Ambulatory Visit: Payer: Self-pay

## 2022-07-31 ENCOUNTER — Encounter: Payer: Self-pay | Admitting: Family

## 2022-07-31 ENCOUNTER — Encounter: Payer: Self-pay | Admitting: Internal Medicine

## 2022-07-31 ENCOUNTER — Other Ambulatory Visit (HOSPITAL_COMMUNITY): Payer: Self-pay

## 2022-07-31 ENCOUNTER — Ambulatory Visit (INDEPENDENT_AMBULATORY_CARE_PROVIDER_SITE_OTHER): Payer: Managed Care, Other (non HMO) | Admitting: Internal Medicine

## 2022-07-31 VITALS — BP 128/82 | HR 73 | Temp 98.1°F | Ht 68.0 in | Wt 178.0 lb

## 2022-07-31 DIAGNOSIS — M150 Primary generalized (osteo)arthritis: Secondary | ICD-10-CM

## 2022-07-31 DIAGNOSIS — Z125 Encounter for screening for malignant neoplasm of prostate: Secondary | ICD-10-CM | POA: Diagnosis not present

## 2022-07-31 DIAGNOSIS — E119 Type 2 diabetes mellitus without complications: Secondary | ICD-10-CM

## 2022-07-31 DIAGNOSIS — N184 Chronic kidney disease, stage 4 (severe): Secondary | ICD-10-CM

## 2022-07-31 DIAGNOSIS — K2100 Gastro-esophageal reflux disease with esophagitis, without bleeding: Secondary | ICD-10-CM

## 2022-07-31 DIAGNOSIS — D539 Nutritional anemia, unspecified: Secondary | ICD-10-CM | POA: Diagnosis not present

## 2022-07-31 DIAGNOSIS — I10 Essential (primary) hypertension: Secondary | ICD-10-CM

## 2022-07-31 DIAGNOSIS — E1142 Type 2 diabetes mellitus with diabetic polyneuropathy: Secondary | ICD-10-CM

## 2022-07-31 DIAGNOSIS — Z794 Long term (current) use of insulin: Secondary | ICD-10-CM

## 2022-07-31 DIAGNOSIS — M159 Polyosteoarthritis, unspecified: Secondary | ICD-10-CM

## 2022-07-31 LAB — PSA: PSA: 0.17 ng/mL (ref 0.10–4.00)

## 2022-07-31 LAB — CBC WITH DIFFERENTIAL/PLATELET
Basophils Absolute: 0 10*3/uL (ref 0.0–0.1)
Basophils Relative: 0.4 % (ref 0.0–3.0)
Eosinophils Absolute: 0.4 10*3/uL (ref 0.0–0.7)
Eosinophils Relative: 4 % (ref 0.0–5.0)
HCT: 39.2 % (ref 39.0–52.0)
Hemoglobin: 13.3 g/dL (ref 13.0–17.0)
Lymphocytes Relative: 32.4 % (ref 12.0–46.0)
Lymphs Abs: 2.9 10*3/uL (ref 0.7–4.0)
MCHC: 33.8 g/dL (ref 30.0–36.0)
MCV: 95.1 fl (ref 78.0–100.0)
Monocytes Absolute: 0.5 10*3/uL (ref 0.1–1.0)
Monocytes Relative: 5.4 % (ref 3.0–12.0)
Neutro Abs: 5.2 10*3/uL (ref 1.4–7.7)
Neutrophils Relative %: 57.8 % (ref 43.0–77.0)
Platelets: 138 10*3/uL — ABNORMAL LOW (ref 150.0–400.0)
RBC: 4.12 Mil/uL — ABNORMAL LOW (ref 4.22–5.81)
RDW: 13.3 % (ref 11.5–15.5)
WBC: 9 10*3/uL (ref 4.0–10.5)

## 2022-07-31 LAB — HEMOGLOBIN A1C: Hgb A1c MFr Bld: 8.7 % — ABNORMAL HIGH (ref 4.6–6.5)

## 2022-07-31 IMAGING — DX DG ABDOMEN 1V
1 series · 1 of 1 positions shown · non-contrast
Comparison: 02/23/2020

CLINICAL DATA: Enteric catheter placement

EXAM:
ABDOMEN - 1 VIEW

[abdomen kub]
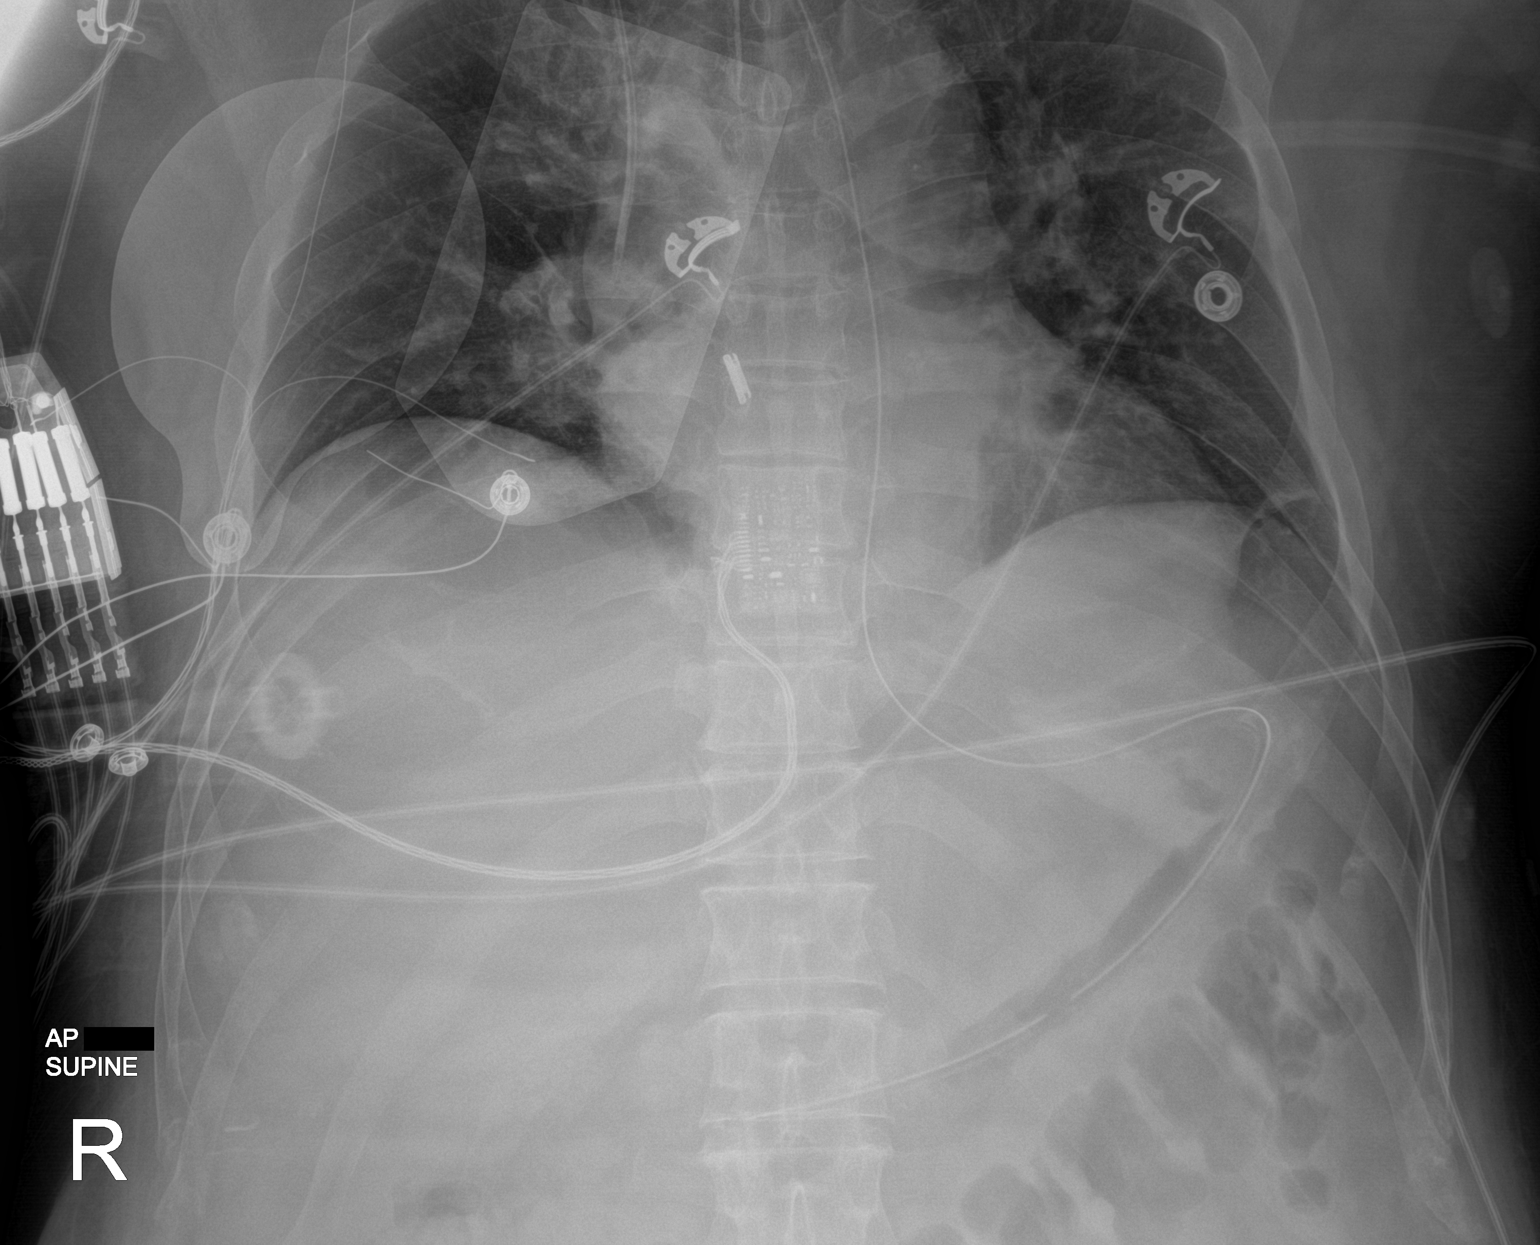

[1 of 1 positions shown; findings below may reference images not displayed]

FINDINGS: Frontal view of the lower chest and upper abdomen demonstrates tip
and side port of an enteric catheter projecting over the gastric
antrum. Endotracheal tube and right internal jugular catheter are
stable since chest x-ray. Bowel gas pattern is unremarkable. Lung
volumes are diminished, with crowding of the central vasculature.
IMPRESSION: 1. Enteric catheter overlying gastric antrum.

## 2022-07-31 MED ORDER — TRAMADOL HCL ER 100 MG PO TB24
100.0000 mg | ORAL_TABLET | Freq: Every day | ORAL | 1 refills | Status: DC | PRN
Start: 2022-07-31 — End: 2023-04-06
  Filled 2022-07-31 (×2): qty 30, 30d supply, fill #0
  Filled 2022-11-19: qty 30, 30d supply, fill #1

## 2022-07-31 MED ORDER — PANTOPRAZOLE SODIUM 40 MG PO TBEC
DELAYED_RELEASE_TABLET | ORAL | 0 refills | Status: DC
Start: 2022-07-31 — End: 2022-09-25
  Filled 2022-07-31: qty 90, 60d supply, fill #0

## 2022-07-31 NOTE — Progress Notes (Signed)
Subjective:  Patient ID: Colton Dunning., male    DOB: 1961-02-14  Age: 62 y.o. MRN: [id number]  CC: Coronary Artery Disease, Anemia, and Diabetes   HPI [facility] [name] presents for f/up ----  He continues to c/o LE MSK pain. He is active and denies DOE, CP, SOB, edema. He complains of GERD and has been taking tums but not the PPI.  Outpatient Medications Prior to Visit  Medication Sig Dispense Refill   allopurinol (ZYLOPRIM) 100 MG tablet TAKE 1 TABLET BY MOUTH EVERY DAY 90 tablet 0   apixaban (ELIQUIS) 5 MG TABS tablet Take 1 tablet (5 mg total) by mouth 2 (two) times daily. 60 tablet 6   ASPIRIN LOW DOSE 81 MG EC tablet Take 81 mg by mouth every 6 (six) hours as needed for moderate pain.     carvedilol (COREG) 12.5 MG tablet Take 1 tablet (12.5 mg total) by mouth 2 (two) times daily. 180 tablet 2   clopidogrel (PLAVIX) 75 MG tablet Take 1 tablet (75 mg total) by mouth daily. 90 tablet 3   Continuous Blood Gluc Receiver (FREESTYLE LIBRE 3 READER) DEVI 1 Act by Does not apply route daily. 1 each 3   Continuous Glucose Receiver (FREESTYLE LIBRE 3 READER) DEVI Use as directed 1 each 3   Continuous Glucose Sensor (FREESTYLE LIBRE 3 SENSOR) MISC Place 1 sensor on the skin every 14 days. Use to check glucose continuously 2 each 5   Evolocumab (REPATHA SURECLICK) 140 MG/ML SOAJ Inject 140 mg into the skin every 14 (fourteen) days. 2 mL 11   ezetimibe (ZETIA) 10 MG tablet Take 1 tablet (10 mg total) by mouth daily. 90 tablet 3   FARXIGA 10 MG TABS tablet Take 1 tablet (10 mg total) by mouth daily before breakfast. 90 tablet 3   Fe Bisgly-Vit C-Vit B12-FA (GENTLE IRON) 28-60-0.008-0.4 MG CAPS Take 1 tablet by mouth in the morning.     icosapent Ethyl (VASCEPA) 1 g capsule Take 2 capsules (2 g total) by mouth 2 (two) times daily. 360 capsule 3   isosorbide mononitrate (IMDUR) 30 MG 24 hr tablet Take 1 tablet (30 mg total) by mouth daily. 90 tablet 2   Magnesium 300 MG CAPS Take  300 mg by mouth daily.     metFORMIN (GLUCOPHAGE-XR) 750 MG 24 hr tablet Take 1 tablet (750 mg total) by mouth daily with breakfast. 90 tablet 0   Multiple Vitamin (MULTIVITAMIN WITH MINERALS) TABS tablet Take 1 tablet by mouth daily.     Semaglutide (RYBELSUS) 7 MG TABS Take 1 tablet (7 mg total) by mouth daily. 90 tablet 0   spironolactone (ALDACTONE) 25 MG tablet Take 1/2 tablet (12.5 mg total) by mouth daily. 45 tablet 3   vitamin B-12 (CYANOCOBALAMIN) 1000 MCG tablet Take 1,000 mcg by mouth in the morning.     pantoprazole (PROTONIX) 40 MG tablet Please take 40 mg oral twice daily x30 days, then 40 mg oral daily. 90 tablet 0   sacubitril-valsartan (ENTRESTO) 24-26 MG Take 1 tablet by mouth 2 (two) times daily. 180 tablet 3   traMADol (ULTRAM) 50 MG tablet Take 1 tablet (50 mg total) by mouth every 12 (twelve) hours as needed. 180 tablet 0   insulin glargine, 2 Unit Dial, (TOUJEO MAX SOLOSTAR) 300 UNIT/ML Solostar Pen Inject 20 Units into the skin daily. 6 mL 1   Insulin Pen Needle 32G X 6 MM MISC Use daily as directed 100 each 0   No  facility-administered medications prior to visit.    ROS Review of Systems  Constitutional: Negative.  Negative for diaphoresis and fatigue.  HENT: Negative.    Eyes: Negative.   Respiratory:  Negative for cough, chest tightness, shortness of breath and wheezing.   Cardiovascular:  Negative for chest pain, palpitations and leg swelling.  Gastrointestinal:  Negative for abdominal pain, blood in stool, constipation, diarrhea, nausea and vomiting.  Endocrine: Negative.   Genitourinary: Negative.  Negative for difficulty urinating.  Musculoskeletal:  Positive for arthralgias. Negative for myalgias.  Skin: Negative.  Negative for color change and pallor.  Neurological:  Negative for dizziness, weakness, light-headedness and headaches.  Hematological:  Negative for adenopathy. Does not bruise/bleed easily.  Psychiatric/Behavioral: Negative.      Objective:   BP 128/82 (BP Location: Right Arm, Patient Position: Sitting, Cuff Size: Large)   Pulse 73   Temp 98.1 F (36.7 C) (Oral)   Ht 5\' 8"  (1.727 m)   Wt 178 lb (80.7 kg)   SpO2 94%   BMI 27.06 kg/m   BP Readings from Last 3 Encounters:  08/01/22 118/86  07/31/22 128/82  07/13/22 116/80    Wt Readings from Last 3 Encounters:  08/01/22 178 lb 6.4 oz (80.9 kg)  07/31/22 178 lb (80.7 kg)  07/13/22 173 lb (78.5 kg)    Physical Exam Vitals reviewed.  Constitutional:      Appearance: Normal appearance.  HENT:     Nose: Nose normal.     Mouth/Throat:     Mouth: Mucous membranes are moist.  Eyes:     General: No scleral icterus.    Conjunctiva/sclera: Conjunctivae normal.  Cardiovascular:     Rate and Rhythm: Normal rate and regular rhythm.     Heart sounds: No murmur heard. Pulmonary:     Effort: Pulmonary effort is normal.     Breath sounds: No stridor. No wheezing, rhonchi or rales.  Abdominal:     General: Abdomen is flat.     Palpations: There is no mass.     Tenderness: There is no abdominal tenderness. There is no guarding.     Hernia: No hernia is present.  Musculoskeletal:        General: Normal range of motion.     Cervical back: Neck supple.     Right lower leg: No edema.     Left lower leg: No edema.  Lymphadenopathy:     Cervical: No cervical adenopathy.  Skin:    General: Skin is warm and dry.     Coloration: Skin is not pale.  Neurological:     General: No focal deficit present.     Mental Status: He is alert. Mental status is at baseline.  Psychiatric:        Mood and Affect: Mood normal.        Behavior: Behavior normal.     Lab Results  Component Value Date   WBC 9.0 07/31/2022   HGB 13.3 07/31/2022   HCT 39.2 07/31/2022   PLT 138.0 (L) 07/31/2022   GLUCOSE 192 (H) 07/13/2022   CHOL 88 (L) 06/11/2022   TRIG 175 (H) 06/11/2022   HDL 31 (L) 06/11/2022   LDLDIRECT 93.0 07/26/2020   LDLCALC 28 06/11/2022   ALT 48 (H) 06/11/2022   AST 29  06/11/2022   NA 137 07/13/2022   K 4.7 07/13/2022   CL 101 07/13/2022   CREATININE 2.10 (H) 07/13/2022   BUN 25 (H) 07/13/2022   CO2 24 07/13/2022   TSH  2.28 05/15/2022   PSA 0.17 07/31/2022   INR 1.2 (H) 05/18/2020   HGBA1C 8.7 (H) 07/31/2022   MICROALBUR <0.7 05/15/2022    CT Angio Chest/Abd/Pel for Dissection W and/or W/WO  Result Date: 07/13/2022 CLINICAL DATA:  Acute aortic syndrome (AAS) suspected EXAM: CT ANGIOGRAPHY CHEST, ABDOMEN AND PELVIS TECHNIQUE: Non-contrast CT of the chest was initially obtained. Multidetector CT imaging through the chest, abdomen and pelvis was performed using the standard protocol during bolus administration of intravenous contrast. Multiplanar reconstructed images and MIPs were obtained and reviewed to evaluate the vascular anatomy. RADIATION DOSE REDUCTION: This exam was performed according to the departmental dose-optimization program which includes automated exposure control, adjustment of the mA and/or kV according to patient size and/or use of iterative reconstruction technique. CONTRAST:  OMNIPAQUE IOHEXOL 350 MG/ML SOLN COMPARISON:  08/12/2020 FINDINGS: CTA CHEST FINDINGS Cardiovascular: Prior CABG. Heart is normal size. Aorta is normal caliber. No aortic dissection. Aortic atherosclerosis. No filling defects in the pulmonary arteries to suggest pulmonary emboli. Mediastinum/Nodes: No mediastinal, hilar, or axillary adenopathy. Trachea and esophagus are unremarkable. Thyroid unremarkable. Lungs/Pleura: Lungs are clear. No focal airspace opacities or suspicious nodules. No effusions. Musculoskeletal: Chest wall soft tissues are unremarkable. No acute bony abnormality. Review of the MIP images confirms the above findings. CTA ABDOMEN AND PELVIS FINDINGS VASCULAR Aorta: Normal caliber aorta without aneurysm, dissection, vasculitis or significant stenosis. Scattered aortic atherosclerosis, most pronounced infrarenal. Celiac: Widely patent SMA: Widely  patent Renals: Single bilaterally, widely patent IMA: Patent without evidence of aneurysm, dissection, vasculitis or significant stenosis. Inflow: Patent without evidence of aneurysm, dissection, vasculitis or significant stenosis. Veins: No obvious venous abnormality within the limitations of this arterial phase study. Review of the MIP images confirms the above findings. NON-VASCULAR Hepatobiliary: Gallbladder filled with gallstones. No biliary ductal dilatation or focal hepatic abnormality. Pancreas: No focal abnormality or ductal dilatation. Spleen: No focal abnormality.  Normal size. Adrenals/Urinary Tract: No adrenal abnormality. No focal renal abnormality. No stones or hydronephrosis. Urinary bladder is unremarkable. Stomach/Bowel: Colonic diverticulosis diffusely. No active diverticulitis. Normal appendix. Stomach and small bowel decompressed, unremarkable. Lymphatic: No adenopathy Reproductive: No visible focal abnormality. Other: No free fluid or free air. Small left inguinal hernia containing fat. Musculoskeletal: No acute bony abnormality. Review of the MIP images confirms the above findings. IMPRESSION: No evidence of aortic aneurysm or dissection. Scattered aortic atherosclerosis. No evidence of pulmonary embolus. No acute findings in the chest, abdomen or pelvis. Cholelithiasis. Diffuse colonic diverticulosis. Left inguinal hernia containing fat. Electronically Signed   By: Charlett Nose M.D.   On: 07/13/2022 21:19   DG Chest 2 View  Result Date: 07/13/2022 CLINICAL DATA:  Chest pain. EXAM: CHEST - 2 VIEW COMPARISON:  October 05, 2021 FINDINGS: Multiple sternal wires are noted. The heart size and mediastinal contours are within normal limits. Both lungs are clear. The visualized skeletal structures are unremarkable. IMPRESSION: 1. Evidence of prior median sternotomy/CABG. 2. No acute cardiopulmonary disease. Electronically Signed   By: Aram Candela M.D.   On: 07/13/2022 19:50    Assessment &  Plan:  Diabetic polyneuropathy associated with type 2 diabetes mellitus (HCC) -     traMADol HCl ER; Take 1 tablet (100 mg total) by mouth daily as needed for pain.  Dispense: 90 tablet; Refill: 1  Primary osteoarthritis involving multiple joints -     traMADol HCl ER; Take 1 tablet (100 mg total) by mouth daily as needed for pain.  Dispense: 90 tablet; Refill: 1  Deficiency anemia-  H/H are normal now. -     Vitamin B1; Future -     Zinc; Future -     Reticulocytes; Future -     CBC with Differential/Platelet; Future  Primary hypertension - His blood pressure is adequately well controlled.  Insulin-requiring or dependent type II diabetes mellitus (HCC) -     Hemoglobin A1c; Future -     HM Diabetes Foot Exam  Chronic renal disease, stage 4, severely decreased glomerular filtration rate (GFR) between 15-29 mL/min/1.73 square meter (HCC)  Prostate cancer screening -     PSA; Future  Gastroesophageal reflux disease with esophagitis without hemorrhage -     Pantoprazole Sodium; Take 1 tablet (40 mg total) 2 (two) times daily for 30 days, THEN 1 tablet (40 mg total) daily.  Dispense: 90 tablet; Refill: 0     Follow-up: Return in about 3 months (around 10/31/2022).  Sanda Linger, MD

## 2022-08-01 ENCOUNTER — Other Ambulatory Visit (HOSPITAL_COMMUNITY): Payer: Self-pay

## 2022-08-01 ENCOUNTER — Ambulatory Visit: Payer: Managed Care, Other (non HMO) | Attending: Cardiology | Admitting: Cardiology

## 2022-08-01 ENCOUNTER — Encounter: Payer: Self-pay | Admitting: Cardiology

## 2022-08-01 VITALS — BP 118/86 | HR 72 | Ht 67.0 in | Wt 178.4 lb

## 2022-08-01 DIAGNOSIS — I2510 Atherosclerotic heart disease of native coronary artery without angina pectoris: Secondary | ICD-10-CM | POA: Diagnosis not present

## 2022-08-01 DIAGNOSIS — Z951 Presence of aortocoronary bypass graft: Secondary | ICD-10-CM

## 2022-08-01 DIAGNOSIS — I10 Essential (primary) hypertension: Secondary | ICD-10-CM

## 2022-08-01 DIAGNOSIS — I5023 Acute on chronic systolic (congestive) heart failure: Secondary | ICD-10-CM

## 2022-08-01 DIAGNOSIS — Z79899 Other long term (current) drug therapy: Secondary | ICD-10-CM

## 2022-08-01 DIAGNOSIS — Z789 Other specified health status: Secondary | ICD-10-CM | POA: Diagnosis not present

## 2022-08-01 DIAGNOSIS — E782 Mixed hyperlipidemia: Secondary | ICD-10-CM

## 2022-08-01 DIAGNOSIS — I480 Paroxysmal atrial fibrillation: Secondary | ICD-10-CM | POA: Diagnosis not present

## 2022-08-01 DIAGNOSIS — I25810 Atherosclerosis of coronary artery bypass graft(s) without angina pectoris: Secondary | ICD-10-CM

## 2022-08-01 DIAGNOSIS — E785 Hyperlipidemia, unspecified: Secondary | ICD-10-CM

## 2022-08-01 DIAGNOSIS — I5022 Chronic systolic (congestive) heart failure: Secondary | ICD-10-CM

## 2022-08-01 MED ORDER — SACUBITRIL-VALSARTAN 24-26 MG PO TABS
1.0000 | ORAL_TABLET | Freq: Two times a day (BID) | ORAL | 3 refills | Status: DC
Start: 2022-08-01 — End: 2023-07-29
  Filled 2022-08-31: qty 60, 30d supply, fill #0
  Filled 2022-10-02: qty 60, 30d supply, fill #1
  Filled 2022-11-07: qty 60, 30d supply, fill #2
  Filled 2022-11-28 – 2022-11-30 (×2): qty 60, 30d supply, fill #3
  Filled 2022-12-25: qty 60, 30d supply, fill #4
  Filled 2023-01-25 – 2023-02-11 (×2): qty 60, 30d supply, fill #5
  Filled 2023-03-20: qty 60, 30d supply, fill #6

## 2022-08-01 NOTE — Patient Instructions (Signed)
Medication Instructions:   Your physician recommends that you continue on your current medications as directed. Please refer to the Current Medication list given to you today.  *If you need a refill on your cardiac medications before your next appointment, please call your pharmacy*    Follow-Up: At [facility], you and your health needs are our priority.  As part of our continuing mission to provide you with exceptional heart care, we have created designated Provider Care Teams.  These Care Teams include your primary Cardiologist (physician) and Advanced Practice Providers (APPs -  Physician Assistants and Nurse Practitioners) who all work together to provide you with the care you need, when you need it.  We recommend signing up for the patient portal called "MyChart".  Sign up information is provided on this After Visit Summary.  MyChart is used to connect with patients for Virtual Visits (Telemedicine).  Patients are able to view lab/test results, encounter notes, upcoming appointments, etc.  Non-urgent messages can be sent to your provider as well.   To learn more about what you can do with MyChart, go to [email or web address].    Your next appointment:   6 month(s)  Provider:   DR. Verne Carrow

## 2022-08-04 LAB — ZINC: Zinc: 64 ug/dL (ref 60–130)

## 2022-08-04 LAB — RETICULOCYTES
ABS Retic: 110160 cells/uL — ABNORMAL HIGH (ref 25000–90000)
Retic Ct Pct: 2.7 %

## 2022-08-04 LAB — VITAMIN B1: Vitamin B1 (Thiamine): 18 nmol/L (ref 8–30)

## 2022-08-06 ENCOUNTER — Telehealth: Payer: Self-pay | Admitting: Internal Medicine

## 2022-08-06 NOTE — Telephone Encounter (Signed)
Pt would like to TOC to [employer] due to location. When both have replied I will make this appt.

## 2022-08-06 NOTE — Telephone Encounter (Signed)
Yes, this is okay with me 

## 2022-08-13 ENCOUNTER — Other Ambulatory Visit (HOSPITAL_COMMUNITY): Payer: Self-pay

## 2022-08-13 ENCOUNTER — Other Ambulatory Visit: Payer: Self-pay | Admitting: Internal Medicine

## 2022-08-13 DIAGNOSIS — I25810 Atherosclerosis of coronary artery bypass graft(s) without angina pectoris: Secondary | ICD-10-CM

## 2022-08-13 DIAGNOSIS — E119 Type 2 diabetes mellitus without complications: Secondary | ICD-10-CM

## 2022-08-13 DIAGNOSIS — I2510 Atherosclerotic heart disease of native coronary artery without angina pectoris: Secondary | ICD-10-CM

## 2022-08-13 MED ORDER — RYBELSUS 7 MG PO TABS
7.0000 mg | ORAL_TABLET | Freq: Every day | ORAL | 0 refills | Status: DC
Start: 2022-08-13 — End: 2022-12-19
  Filled 2022-08-13 – 2022-08-21 (×2): qty 30, 30d supply, fill #0
  Filled 2022-10-02: qty 30, 30d supply, fill #1
  Filled 2022-10-10 – 2022-11-07 (×2): qty 30, 30d supply, fill #2

## 2022-08-14 ENCOUNTER — Other Ambulatory Visit (HOSPITAL_COMMUNITY): Payer: Self-pay

## 2022-08-21 ENCOUNTER — Other Ambulatory Visit (HOSPITAL_COMMUNITY): Payer: Self-pay

## 2022-08-21 ENCOUNTER — Encounter: Payer: Managed Care, Other (non HMO) | Admitting: Family Medicine

## 2022-08-22 NOTE — Telephone Encounter (Signed)
Pt scheduled for Transfer of Care appt with [name] on 08/28/22.

## 2022-08-24 ENCOUNTER — Other Ambulatory Visit (HOSPITAL_COMMUNITY): Payer: Self-pay

## 2022-08-24 ENCOUNTER — Encounter: Payer: Self-pay | Admitting: Family

## 2022-08-27 ENCOUNTER — Encounter: Payer: Self-pay | Admitting: Family

## 2022-08-27 ENCOUNTER — Other Ambulatory Visit (HOSPITAL_COMMUNITY): Payer: Self-pay

## 2022-08-28 ENCOUNTER — Ambulatory Visit (INDEPENDENT_AMBULATORY_CARE_PROVIDER_SITE_OTHER): Payer: Managed Care, Other (non HMO) | Admitting: Internal Medicine

## 2022-08-28 ENCOUNTER — Encounter: Payer: Self-pay | Admitting: Internal Medicine

## 2022-08-28 VITALS — BP 110/70 | HR 86 | Temp 98.3°F | Ht 67.0 in | Wt 180.0 lb

## 2022-08-28 DIAGNOSIS — E785 Hyperlipidemia, unspecified: Secondary | ICD-10-CM | POA: Diagnosis not present

## 2022-08-28 DIAGNOSIS — I2510 Atherosclerotic heart disease of native coronary artery without angina pectoris: Secondary | ICD-10-CM | POA: Diagnosis not present

## 2022-08-28 DIAGNOSIS — E1142 Type 2 diabetes mellitus with diabetic polyneuropathy: Secondary | ICD-10-CM

## 2022-08-28 NOTE — Progress Notes (Signed)
[facility] [address] [place] [address] Dept: [phone] Dept Fax: [phone]  New Patient Office Visit  Subjective:   Colton Guzman. 1960-09-01 08/28/2022  Chief Complaint  Patient presents with   Transitions Of Care    A1C and medications    HPI: Colton Guzman. presents today to establish care at [facility] [employer]. Introduced to Publishing rights manager role and practice setting.  All questions answered.   Last PCP: Sanda Linger MD Concerns: See below   Discussed the use of AI scribe software for clinical note transcription with the patient, who gave verbal consent to proceed.  History of Present Illness   The patient, with a history of quintuple bypass surgery, atrial fibrillation, and diabetes, presents for a routine follow-up. They report that their most recent HbA1c was 8.7, up from 7.2 several months ago. In response, their previous provider increased their dose of Rebelsus, which they take in addition to [place]. They had an allergic reaction to metformin in the past and are no longer taking it. They are also awaiting approval from their insurance company to restart using the [employer] 3 for blood glucose monitoring.  The patient also reports a history of elevated liver enzymes, which led to discontinuation of atorvastatin and the initiation of Repatha. They continue to take Zetia for cholesterol management. Their most recent cholesterol panel showed good control.  The patient has been experiencing neuropathy in their feet, describing the sensation as "walking on water" or "wet boots." They report that the sensation has improved somewhat recently, and they are now able to move their feet more than before. They have stopped using a cane for balance about a month ago. They are considering trying a nerve cream for symptom management.         The following portions of  the patient's history were reviewed and updated as appropriate: past medical history, past surgical history, family history, social history, allergies, medications, and problem list.   Patient Active Problem List   Diagnosis Date Noted   Deficiency anemia 07/31/2022   Prostate cancer screening 07/31/2022   Gastroesophageal reflux disease with esophagitis without hemorrhage 07/31/2022   Coronary artery disease involving native coronary artery of native heart without angina pectoris 05/19/2022   Diabetic polyneuropathy associated with type 2 diabetes mellitus (HCC) 05/19/2022   Chronic renal disease, stage 4, severely decreased glomerular filtration rate (GFR) between 15-29 mL/min/1.73 square meter (HCC) 11/14/2021   Encounter for general adult medical examination with abnormal findings 03/28/2021   ED (erectile dysfunction) of organic origin 03/28/2021   Insulin-requiring or dependent type II diabetes mellitus (HCC) 03/28/2021   Need for vaccination 03/28/2021   Iron deficiency anemia due to chronic blood loss 08/26/2020   PAF (paroxysmal atrial fibrillation) (HCC) 08/12/2020   Sinus tachycardia    Vitamin B12 deficiency anemia due to intrinsic factor deficiency 05/19/2020   Primary osteoarthritis involving multiple joints 05/19/2020   Dyslipidemia, goal LDL below 70 05/18/2020   H8IO (type 2 diabetes mellitus) (HCC) 02/22/2020   Hypertension    Gout    CAD (coronary artery disease) of artery bypass graft 02/2020   Past Medical History:  Diagnosis Date   Anemia    hx iron transfusion   Arthritis    left shoulder   CAD (coronary artery disease) of artery bypass graft 02/2020   Diabetes mellitus without complication (HCC)    type 2   Elevated blood uric acid level  GI bleed    History of blood transfusion 08/19/2020   Hyperlipidemia LDL goal <70    Hypertension    PAF (paroxysmal atrial fibrillation) (HCC)    Wears partial dentures    upper dent/lower partial   Past Surgical  History:  Procedure Laterality Date   BIOPSY  08/17/2020   Procedure: BIOPSY;  Surgeon: Napoleon Form, MD;  Location: [facility];  Service: Endoscopy;;   COLONOSCOPY WITH PROPOFOL Left 02/27/2020   Procedure: COLONOSCOPY WITH PROPOFOL;  Surgeon: Kathi Der, MD;  Location: [facility] ENDOSCOPY;  Service: Gastroenterology;  Laterality: Left;   COLONOSCOPY WITH PROPOFOL N/A 08/13/2020   Procedure: COLONOSCOPY WITH PROPOFOL;  Surgeon: Iva Boop, MD;  Location: [facility] ENDOSCOPY;  Service: Endoscopy;  Laterality: N/A;   CORONARY ARTERY BYPASS GRAFT N/A 03/08/2020   Procedure: CORONARY ARTERY BYPASS GRAFTING (CABG) x 5 ON PUMP;  Surgeon: Linden Dolin, MD;  Location: [facility] OR;  Service: Open Heart Surgery;  Laterality: N/A;   ENDOVEIN HARVEST OF GREATER SAPHENOUS VEIN Right 03/08/2020   Procedure: ENDOVEIN HARVEST OF GREATER SAPHENOUS VEIN;  Surgeon: Linden Dolin, MD;  Location: [facility] OR;  Service: Open Heart Surgery;  Laterality: Right;   ESOPHAGOGASTRODUODENOSCOPY (EGD) WITH PROPOFOL N/A 02/27/2020   Procedure: ESOPHAGOGASTRODUODENOSCOPY (EGD) WITH PROPOFOL;  Surgeon: Kathi Der, MD;  Location: [facility] ENDOSCOPY;  Service: Gastroenterology;  Laterality: N/A;   ESOPHAGOGASTRODUODENOSCOPY (EGD) WITH PROPOFOL N/A 08/17/2020   Procedure: ESOPHAGOGASTRODUODENOSCOPY (EGD) WITH PROPOFOL;  Surgeon: Napoleon Form, MD;  Location: [facility] ENDOSCOPY;  Service: Endoscopy;  Laterality: N/A;   INSERTION OF MESH Left 05/09/2021   Procedure: INSERTION OF MESH;  Surgeon: Axel Filler, MD;  Location: [facility] OR;  Service: General;  Laterality: Left;   LEFT HEART CATH AND CORONARY ANGIOGRAPHY N/A 03/04/2020   Procedure: LEFT HEART CATH AND CORONARY ANGIOGRAPHY;  Surgeon: [name], MD;  Location: [facility] INVASIVE CV LAB;  Service: Cardiovascular;  Laterality: N/A;   RADIAL ARTERY HARVEST Left 03/08/2020   Procedure: RADIAL ARTERY HARVEST;  Surgeon: Linden Dolin, MD;  Location: [facility] OR;  Service: Open Heart  Surgery;  Laterality: Left;   TEE WITHOUT CARDIOVERSION N/A 03/08/2020   Procedure: TRANSESOPHAGEAL ECHOCARDIOGRAM (TEE);  Surgeon: Linden Dolin, MD;  Location: [facility] OR;  Service: Open Heart Surgery;  Laterality: N/A;   XI ROBOTIC ASSISTED INGUINAL HERNIA REPAIR WITH MESH Left 05/09/2021   Procedure: XI ROBOTIC ASSISTED LEFT INGUINAL HERNIA REPAIR WITH MESH;  Surgeon: Axel Filler, MD;  Location: [facility] OR;  Service: General;  Laterality: Left;   History reviewed. No pertinent family history. Outpatient Medications Prior to Visit  Medication Sig Dispense Refill   allopurinol (ZYLOPRIM) 100 MG tablet TAKE 1 TABLET BY MOUTH EVERY DAY 90 tablet 0   apixaban (ELIQUIS) 5 MG TABS tablet Take 1 tablet (5 mg total) by mouth 2 (two) times daily. 60 tablet 6   ASPIRIN LOW DOSE 81 MG EC tablet Take 81 mg by mouth every 6 (six) hours as needed for moderate pain.     carvedilol (COREG) 12.5 MG tablet Take 1 tablet (12.5 mg total) by mouth 2 (two) times daily. 180 tablet 2   clopidogrel (PLAVIX) 75 MG tablet Take 1 tablet (75 mg total) by mouth daily. 90 tablet 3   Continuous Blood Gluc Receiver (FREESTYLE LIBRE 3 READER) DEVI 1 Act by Does not apply route daily. 1 each 3   Continuous Glucose Receiver (FREESTYLE LIBRE 3 READER) DEVI Use as directed 1 each 3   Continuous Glucose Sensor (FREESTYLE  LIBRE 3 SENSOR) MISC Place 1 sensor on the skin every 14 days. Use to check glucose continuously 2 each 5   Evolocumab (REPATHA SURECLICK) 140 MG/ML SOAJ Inject 140 mg into the skin every 14 (fourteen) days. 2 mL 11   ezetimibe (ZETIA) 10 MG tablet Take 1 tablet (10 mg total) by mouth daily. 90 tablet 3   FARXIGA 10 MG TABS tablet Take 1 tablet (10 mg total) by mouth daily before breakfast. 90 tablet 3   Fe Bisgly-Vit C-Vit B12-FA (GENTLE IRON) 28-60-0.008-0.4 MG CAPS Take 1 tablet by mouth in the morning.     icosapent Ethyl (VASCEPA) 1 g capsule Take 2 capsules (2 g total) by mouth 2 (two) times daily. 360 capsule  3   isosorbide mononitrate (IMDUR) 30 MG 24 hr tablet Take 1 tablet (30 mg total) by mouth daily. 90 tablet 2   Magnesium 300 MG CAPS Take 300 mg by mouth daily.     Multiple Vitamin (MULTIVITAMIN WITH MINERALS) TABS tablet Take 1 tablet by mouth daily.     pantoprazole (PROTONIX) 40 MG tablet Take 1 tablet (40 mg total) 2 (two) times daily for 30 days, THEN 1 tablet (40 mg total) daily. 90 tablet 0   sacubitril-valsartan (ENTRESTO) 24-26 MG Take 1 tablet by mouth 2 (two) times daily. 180 tablet 3   Semaglutide (RYBELSUS) 7 MG TABS Take 1 tablet (7 mg total) by mouth daily. 90 tablet 0   spironolactone (ALDACTONE) 25 MG tablet Take 1/2 tablet (12.5 mg total) by mouth daily. 45 tablet 3   traMADol (ULTRAM-ER) 100 MG 24 hr tablet Take 1 tablet (100 mg total) by mouth daily as needed for pain. 90 tablet 1   vitamin B-12 (CYANOCOBALAMIN) 1000 MCG tablet Take 1,000 mcg by mouth in the morning.     metFORMIN (GLUCOPHAGE-XR) 750 MG 24 hr tablet Take 1 tablet (750 mg total) by mouth daily with breakfast. 90 tablet 0   No facility-administered medications prior to visit.   Allergies  Allergen Reactions   Atorvastatin Other (See Comments)    Significant elevation in LFTs while on statins   Crestor [Rosuvastatin] Other (See Comments)    Significant elevation in LFTs while on statins   Metformin And Related Other (See Comments)   Propofol Other (See Comments)    Encephalopathy    ROS: A complete ROS was performed with pertinent positives/negatives noted in the HPI. The remainder of the ROS are negative.   Objective:   Today's Vitals   08/28/22 1402  BP: 110/70  Pulse: 86  Temp: 98.3 F (36.8 C)  TempSrc: Temporal  SpO2: 97%  Weight: 180 lb (81.6 kg)  Height: 5\' 7"  (1.702 m)       GENERAL: Well-appearing, in NAD. Well nourished.  SKIN: Pink, warm and dry. No rash, lesion, ulceration, or ecchymoses.  RESPIRATORY: Chest wall symmetrical. Respirations even and non-labored. Breath sounds  clear to auscultation bilaterally.  CARDIAC: S1, S2 present, regular rate and rhythm. Peripheral pulses 2+ bilaterally.   EXTREMITIES: Without clubbing, cyanosis, or edema.  NEUROLOGIC: Sensory exam of the foot is decreased, tested with the monofilament. Good pulses, no lesions or ulcers, good peripheral pulses.Steady, even gait.  PSYCH/MENTAL STATUS: Alert, oriented x 3. Cooperative, appropriate mood and affect.   Health Maintenance Due  Topic Date Due   OPHTHALMOLOGY EXAM  07/05/2021    No results found for any visits on 08/28/22.     Assessment & Plan:  Assessment and Plan    Type 2  Diabetes Mellitus: Elevated HbA1c (8.7) despite treatment with Farxiga 10mg  daily and Rybelsus 7mg  daily. Patient has a history of allergic reaction to Metformin. Rybelsus was recently increased due to elevated HbA1c. -Continue Farxiga 10mg  daily and Rybelsus 7mg  daily. -Recheck HbA1c in 3 months. -Consider further increase in Rybelsus if HbA1c remains elevated. -Encourage patient to follow up with insurance regarding Freestyle Libre 3.  Peripheral Neuropathy: Patient reports decreased sensation in feet, likely secondary to diabetes. No current treatment for neuropathy. -Encourage patient to try neuropathy foot rub. -Monitor condition.  Hyperlipidemia: Patient is on Repatha and Zetia for cholesterol management. Recent labs showed good control of cholesterol levels. -Continue Repatha and Zetia. -Monitor cholesterol levels.  Cardiovascular Disease: History of atrial fibrillation and quintuple bypass. Patient is on Plavix and Entresto, managed by cardiologist. -Encourage patient to follow up with cardiologist for medication refills and management.  Follow-up in 3 months.       Return in about 3 months (around 11/28/2022) for Chronic Condition follow up.   Of note, portions of this note may have been created with voice recognition software Physicist, medical). While this note has been edited for  accuracy, occasional wrong-word or 'sound-a-like' substitutions may have occurred due to the inherent limitations of voice recognition software.  [name], FNP

## 2022-08-29 ENCOUNTER — Other Ambulatory Visit (HOSPITAL_COMMUNITY): Payer: Self-pay

## 2022-08-31 ENCOUNTER — Other Ambulatory Visit (HOSPITAL_COMMUNITY): Payer: Self-pay

## 2022-08-31 ENCOUNTER — Encounter: Payer: Self-pay | Admitting: Family

## 2022-09-03 ENCOUNTER — Other Ambulatory Visit (HOSPITAL_COMMUNITY): Payer: Self-pay

## 2022-09-04 ENCOUNTER — Other Ambulatory Visit (HOSPITAL_COMMUNITY): Payer: Self-pay

## 2022-09-06 ENCOUNTER — Telehealth: Payer: Self-pay

## 2022-09-06 ENCOUNTER — Encounter: Payer: Self-pay | Admitting: Family

## 2022-09-06 ENCOUNTER — Other Ambulatory Visit (HOSPITAL_COMMUNITY): Payer: Self-pay

## 2022-09-06 NOTE — Telephone Encounter (Signed)
Pharmacy Patient Advocate Encounter   Received notification from CoverMyMeds that prior authorization for [facility] 3 Reader device and Sensors is required/requested.   Insurance verification completed.   The patient is insured through [facility] .   Per test claim:  Dexcom is preferred by the ins.  If suggested medication is appropriate, Please send in a new RX and discontinue this one. If not, please advise as to why it's not appropriate so that we may request a Prior Authorization.  Key: [id number]

## 2022-09-10 ENCOUNTER — Ambulatory Visit: Payer: Managed Care, Other (non HMO) | Admitting: Registered"

## 2022-09-11 LAB — HM DIABETES EYE EXAM

## 2022-09-12 ENCOUNTER — Other Ambulatory Visit (HOSPITAL_COMMUNITY): Payer: Self-pay

## 2022-09-12 ENCOUNTER — Other Ambulatory Visit: Payer: Self-pay

## 2022-09-12 MED ORDER — APIXABAN 5 MG PO TABS
5.0000 mg | ORAL_TABLET | Freq: Two times a day (BID) | ORAL | 6 refills | Status: DC
  Filled 2022-09-12: qty 60, 30d supply, fill #0
  Filled 2022-10-10: qty 60, 30d supply, fill #1
  Filled 2022-10-31 – 2022-11-02 (×2): qty 60, 30d supply, fill #2
  Filled 2022-12-11: qty 60, 30d supply, fill #3
  Filled 2023-01-12: qty 60, 30d supply, fill #4
  Filled 2023-02-11: qty 60, 30d supply, fill #5
  Filled 2023-03-20: qty 60, 30d supply, fill #6

## 2022-09-12 NOTE — Telephone Encounter (Signed)
Prescription refill request for Eliquis received. Indication:afib Last office visit:6/24 Scr:2.10  5/24 Age: 62 Weight:81.6  kg  Prescription refilled

## 2022-09-13 ENCOUNTER — Other Ambulatory Visit (HOSPITAL_COMMUNITY): Payer: Self-pay

## 2022-09-13 NOTE — Telephone Encounter (Signed)
Pharmacy Patient Advocate Encounter  Received notification from [facility] that Prior Authorization for [facility] 3 reader device and sensors has been CANCELLED due to;questions associated with the PA expiring.  PA #/Case ID/Reference #: [id number]  Per test claims, Dexcom is covered by the patients insurance without a prior authorization.

## 2022-09-21 ENCOUNTER — Other Ambulatory Visit (HOSPITAL_COMMUNITY): Payer: Self-pay

## 2022-09-25 ENCOUNTER — Other Ambulatory Visit: Payer: Self-pay

## 2022-09-25 ENCOUNTER — Other Ambulatory Visit (HOSPITAL_COMMUNITY): Payer: Self-pay

## 2022-09-25 ENCOUNTER — Other Ambulatory Visit: Payer: Self-pay | Admitting: Internal Medicine

## 2022-09-25 DIAGNOSIS — K2100 Gastro-esophageal reflux disease with esophagitis, without bleeding: Secondary | ICD-10-CM

## 2022-09-25 MED ORDER — PANTOPRAZOLE SODIUM 40 MG PO TBEC
DELAYED_RELEASE_TABLET | ORAL | 0 refills | Status: DC
Start: 2022-09-25 — End: 2023-04-04
  Filled 2022-09-25: qty 30, 30d supply, fill #0
  Filled 2022-10-10 – 2022-10-31 (×2): qty 30, 30d supply, fill #1
  Filled 2022-12-25: qty 30, 15d supply, fill #2

## 2022-09-27 ENCOUNTER — Other Ambulatory Visit (HOSPITAL_COMMUNITY): Payer: Self-pay

## 2022-09-27 ENCOUNTER — Other Ambulatory Visit: Payer: Self-pay

## 2022-10-03 ENCOUNTER — Other Ambulatory Visit (HOSPITAL_COMMUNITY): Payer: Self-pay

## 2022-10-04 ENCOUNTER — Other Ambulatory Visit: Payer: Self-pay

## 2022-10-04 ENCOUNTER — Other Ambulatory Visit (HOSPITAL_COMMUNITY): Payer: Self-pay

## 2022-10-05 ENCOUNTER — Other Ambulatory Visit (HOSPITAL_COMMUNITY): Payer: Self-pay

## 2022-10-10 ENCOUNTER — Other Ambulatory Visit (HOSPITAL_COMMUNITY): Payer: Self-pay

## 2022-10-11 ENCOUNTER — Encounter: Payer: Self-pay | Admitting: Internal Medicine

## 2022-10-11 ENCOUNTER — Other Ambulatory Visit: Payer: Self-pay

## 2022-10-12 ENCOUNTER — Other Ambulatory Visit (HOSPITAL_COMMUNITY): Payer: Self-pay

## 2022-10-23 ENCOUNTER — Other Ambulatory Visit (HOSPITAL_COMMUNITY): Payer: Self-pay

## 2022-10-25 ENCOUNTER — Other Ambulatory Visit (HOSPITAL_COMMUNITY): Payer: Self-pay

## 2022-10-31 ENCOUNTER — Other Ambulatory Visit: Payer: Self-pay

## 2022-10-31 ENCOUNTER — Other Ambulatory Visit (HOSPITAL_COMMUNITY): Payer: Self-pay

## 2022-10-31 ENCOUNTER — Other Ambulatory Visit: Payer: Self-pay | Admitting: Internal Medicine

## 2022-10-31 DIAGNOSIS — M1A09X0 Idiopathic chronic gout, multiple sites, without tophus (tophi): Secondary | ICD-10-CM

## 2022-11-01 ENCOUNTER — Other Ambulatory Visit (HOSPITAL_COMMUNITY): Payer: Self-pay

## 2022-11-02 ENCOUNTER — Other Ambulatory Visit (HOSPITAL_COMMUNITY): Payer: Self-pay

## 2022-11-06 ENCOUNTER — Other Ambulatory Visit (HOSPITAL_COMMUNITY): Payer: Self-pay

## 2022-11-06 MED ORDER — ALLOPURINOL 100 MG PO TABS
100.0000 mg | ORAL_TABLET | Freq: Every day | ORAL | 0 refills | Status: DC
Start: 2022-11-06 — End: 2023-04-17
  Filled 2022-11-06: qty 30, 30d supply, fill #0
  Filled 2022-11-19 – 2022-12-11 (×2): qty 30, 30d supply, fill #1
  Filled 2023-01-12: qty 30, 30d supply, fill #2

## 2022-11-07 ENCOUNTER — Other Ambulatory Visit (HOSPITAL_COMMUNITY): Payer: Self-pay

## 2022-11-07 ENCOUNTER — Encounter: Payer: Self-pay | Admitting: Internal Medicine

## 2022-11-07 DIAGNOSIS — H3500 Unspecified background retinopathy: Secondary | ICD-10-CM | POA: Insufficient documentation

## 2022-11-09 ENCOUNTER — Other Ambulatory Visit (HOSPITAL_COMMUNITY): Payer: Self-pay

## 2022-11-15 ENCOUNTER — Ambulatory Visit: Payer: Managed Care, Other (non HMO) | Admitting: Internal Medicine

## 2022-11-19 ENCOUNTER — Other Ambulatory Visit: Payer: Self-pay | Admitting: Cardiology

## 2022-11-19 ENCOUNTER — Encounter: Payer: Self-pay | Admitting: Family

## 2022-11-19 ENCOUNTER — Other Ambulatory Visit: Payer: Self-pay

## 2022-11-19 ENCOUNTER — Other Ambulatory Visit (HOSPITAL_COMMUNITY): Payer: Self-pay

## 2022-11-19 DIAGNOSIS — K922 Gastrointestinal hemorrhage, unspecified: Secondary | ICD-10-CM

## 2022-11-19 DIAGNOSIS — Z79899 Other long term (current) drug therapy: Secondary | ICD-10-CM

## 2022-11-19 DIAGNOSIS — D510 Vitamin B12 deficiency anemia due to intrinsic factor deficiency: Secondary | ICD-10-CM

## 2022-11-19 DIAGNOSIS — I25118 Atherosclerotic heart disease of native coronary artery with other forms of angina pectoris: Secondary | ICD-10-CM

## 2022-11-19 DIAGNOSIS — Z951 Presence of aortocoronary bypass graft: Secondary | ICD-10-CM

## 2022-11-19 DIAGNOSIS — E1159 Type 2 diabetes mellitus with other circulatory complications: Secondary | ICD-10-CM

## 2022-11-19 DIAGNOSIS — E782 Mixed hyperlipidemia: Secondary | ICD-10-CM

## 2022-11-19 DIAGNOSIS — I10 Essential (primary) hypertension: Secondary | ICD-10-CM

## 2022-11-19 DIAGNOSIS — D500 Iron deficiency anemia secondary to blood loss (chronic): Secondary | ICD-10-CM

## 2022-11-20 ENCOUNTER — Other Ambulatory Visit: Payer: Self-pay

## 2022-11-20 ENCOUNTER — Other Ambulatory Visit (HOSPITAL_COMMUNITY): Payer: Self-pay

## 2022-11-20 ENCOUNTER — Encounter: Payer: Self-pay | Admitting: Family

## 2022-11-20 ENCOUNTER — Telehealth: Payer: Self-pay | Admitting: Cardiovascular Disease

## 2022-11-20 DIAGNOSIS — E119 Type 2 diabetes mellitus without complications: Secondary | ICD-10-CM

## 2022-11-20 DIAGNOSIS — K922 Gastrointestinal hemorrhage, unspecified: Secondary | ICD-10-CM

## 2022-11-20 DIAGNOSIS — E782 Mixed hyperlipidemia: Secondary | ICD-10-CM

## 2022-11-20 DIAGNOSIS — Z951 Presence of aortocoronary bypass graft: Secondary | ICD-10-CM

## 2022-11-20 DIAGNOSIS — E1159 Type 2 diabetes mellitus with other circulatory complications: Secondary | ICD-10-CM

## 2022-11-20 DIAGNOSIS — I25118 Atherosclerotic heart disease of native coronary artery with other forms of angina pectoris: Secondary | ICD-10-CM

## 2022-11-20 DIAGNOSIS — Z79899 Other long term (current) drug therapy: Secondary | ICD-10-CM

## 2022-11-20 DIAGNOSIS — D510 Vitamin B12 deficiency anemia due to intrinsic factor deficiency: Secondary | ICD-10-CM

## 2022-11-20 DIAGNOSIS — D500 Iron deficiency anemia secondary to blood loss (chronic): Secondary | ICD-10-CM

## 2022-11-20 MED ORDER — CARVEDILOL 12.5 MG PO TABS
12.5000 mg | ORAL_TABLET | Freq: Two times a day (BID) | ORAL | 0 refills | Status: DC
Start: 2022-11-20 — End: 2023-11-21
  Filled 2022-11-20: qty 60, 30d supply, fill #0
  Filled 2022-12-25: qty 60, 30d supply, fill #1
  Filled 2023-02-11: qty 60, 30d supply, fill #2

## 2022-11-20 NOTE — Telephone Encounter (Signed)
*  STAT* If patient is at the pharmacy, call can be transferred to refill team.   1. Which medications need to be refilled? (please list name of each medication and dose if known) carvedilol (COREG) 12.5 MG tablet   2. Would you like to learn more about the convenience, safety, & potential cost savings by using the [facility]? No   3. Are you open to using the [facility] No  4. Which pharmacy/location (including street and city if local pharmacy) is medication to be sent to?[place] [facility]    5. Do they need a 30 day or 90 day supply? 90 Day Supply  Pt is currently out and has been out a few days.

## 2022-11-20 NOTE — Telephone Encounter (Signed)
Pt's medication was sent to pt's pharmacy as requested. Confirmation received.  °

## 2022-11-28 ENCOUNTER — Other Ambulatory Visit (HOSPITAL_COMMUNITY): Payer: Self-pay

## 2022-11-29 ENCOUNTER — Other Ambulatory Visit: Payer: Self-pay

## 2022-11-30 ENCOUNTER — Other Ambulatory Visit (HOSPITAL_COMMUNITY): Payer: Self-pay

## 2022-11-30 ENCOUNTER — Encounter: Payer: Self-pay | Admitting: Family

## 2022-12-12 ENCOUNTER — Other Ambulatory Visit (HOSPITAL_COMMUNITY): Payer: Self-pay

## 2022-12-17 ENCOUNTER — Encounter: Payer: Self-pay | Admitting: Internal Medicine

## 2022-12-17 ENCOUNTER — Ambulatory Visit (INDEPENDENT_AMBULATORY_CARE_PROVIDER_SITE_OTHER): Payer: Managed Care, Other (non HMO) | Admitting: Internal Medicine

## 2022-12-17 VITALS — BP 110/70 | HR 76 | Temp 98.7°F | Ht 67.0 in | Wt 185.4 lb

## 2022-12-17 DIAGNOSIS — D500 Iron deficiency anemia secondary to blood loss (chronic): Secondary | ICD-10-CM

## 2022-12-17 DIAGNOSIS — Z7984 Long term (current) use of oral hypoglycemic drugs: Secondary | ICD-10-CM

## 2022-12-17 DIAGNOSIS — N184 Chronic kidney disease, stage 4 (severe): Secondary | ICD-10-CM

## 2022-12-17 DIAGNOSIS — I10 Essential (primary) hypertension: Secondary | ICD-10-CM

## 2022-12-17 DIAGNOSIS — E1142 Type 2 diabetes mellitus with diabetic polyneuropathy: Secondary | ICD-10-CM | POA: Diagnosis not present

## 2022-12-17 DIAGNOSIS — E785 Hyperlipidemia, unspecified: Secondary | ICD-10-CM

## 2022-12-17 DIAGNOSIS — Z23 Encounter for immunization: Secondary | ICD-10-CM

## 2022-12-17 DIAGNOSIS — M1A09X0 Idiopathic chronic gout, multiple sites, without tophus (tophi): Secondary | ICD-10-CM

## 2022-12-17 LAB — CBC WITH DIFFERENTIAL/PLATELET
Basophils Absolute: 0.1 10*3/uL (ref 0.0–0.1)
Basophils Relative: 0.5 % (ref 0.0–3.0)
Eosinophils Absolute: 0.2 10*3/uL (ref 0.0–0.7)
Eosinophils Relative: 2.2 % (ref 0.0–5.0)
HCT: 41.5 % (ref 39.0–52.0)
Hemoglobin: 13.9 g/dL (ref 13.0–17.0)
Lymphocytes Relative: 31.2 % (ref 12.0–46.0)
Lymphs Abs: 2.9 10*3/uL (ref 0.7–4.0)
MCHC: 33.5 g/dL (ref 30.0–36.0)
MCV: 96.4 fL (ref 78.0–100.0)
Monocytes Absolute: 0.5 10*3/uL (ref 0.1–1.0)
Monocytes Relative: 4.9 % (ref 3.0–12.0)
Neutro Abs: 5.7 10*3/uL (ref 1.4–7.7)
Neutrophils Relative %: 61.2 % (ref 43.0–77.0)
Platelets: 158 10*3/uL (ref 150.0–400.0)
RBC: 4.31 Mil/uL (ref 4.22–5.81)
RDW: 13.6 % (ref 11.5–15.5)
WBC: 9.4 10*3/uL (ref 4.0–10.5)

## 2022-12-17 LAB — COMPREHENSIVE METABOLIC PANEL
ALT: 20 U/L (ref 0–53)
AST: 16 U/L (ref 0–37)
Albumin: 4.4 g/dL (ref 3.5–5.2)
Alkaline Phosphatase: 52 U/L (ref 39–117)
BUN: 21 mg/dL (ref 6–23)
CO2: 26 meq/L (ref 19–32)
Calcium: 9.4 mg/dL (ref 8.4–10.5)
Chloride: 99 meq/L (ref 96–112)
Creatinine, Ser: 1.44 mg/dL (ref 0.40–1.50)
GFR: 52.73 mL/min — ABNORMAL LOW (ref >60.00)
Glucose, Bld: 187 mg/dL — ABNORMAL HIGH (ref 70–99)
Potassium: 4.3 meq/L (ref 3.5–5.1)
Sodium: 133 meq/L — ABNORMAL LOW (ref 135–145)
Total Bilirubin: 0.5 mg/dL (ref 0.2–1.2)
Total Protein: 7.8 g/dL (ref 6.0–8.3)

## 2022-12-17 LAB — HEMOGLOBIN A1C: Hgb A1c MFr Bld: 9.1 % — ABNORMAL HIGH (ref 4.6–6.5)

## 2022-12-17 LAB — TSH: TSH: 1.8 u[IU]/mL (ref 0.35–5.50)

## 2022-12-17 NOTE — Progress Notes (Signed)
[facility] [address] [place] [address] Dept: [phone] Dept Fax: [phone]    Subjective:   Colton Guzman. 04-10-1960 12/17/2022  Chief Complaint  Patient presents with   Follow-up    HPI: Colton Guzman. presents today for re-assessment and management of chronic medical conditions.  DIABETES MELLITUS: Colton Guzman. presents for the medical management of diabetes.  Current diabetes medication regimen: rybelsus 7mg , Farxiga 10mg  Patient is  adhering to a diabetic diet.  Patient is  checking BS regularly. Avg: 150-180 Patient is  checking their feet regularly.  Patient is  exercising regularly. Denies polydipsia, polyphagia, polyuria, open wounds or ulcers on feet.  Lab Results  Component Value Date   HGBA1C 9.1 (H) 12/17/2022    07/31/2022 Lab Results  Component Value Date   MICROALBUR <0.7 05/15/2022   MICROALBUR 1.3 03/28/2021   HYPERTENSION: [employer]. presents for the medical management of hypertension.  Patient's current hypertension medication regimen is: Entresto, Spironolactone,Imdur, Coreg  Patient is  currently taking prescribed medications for HTN.  Patient is  regularly keeping a check on BP at home.  Patient is  adhering to low salt diet.  Denies headache, dizziness, CP, SHOB, vision changes.   BP Readings from Last 3 Encounters:  12/17/22 110/70  08/28/22 110/70  08/01/22 118/86   HYPERLIPIDEMIA: Colton Guzman. presents for the medical management of hyperlipidemia.  Patient's current HLD regimen is: Repatha Patient is  currently taking prescribed medications for HLD.  Adhering to heathy diet: yes Exercising regularly: yes Patient is not fasting for labs today.  Lab Results  Component Value Date   CHOL 88 (L) 06/11/2022   HDL 31 (L) 06/11/2022   LDLCALC 28 06/11/2022   LDLDIRECT 93.0 07/26/2020   TRIG 175 (H) 06/11/2022    CHOLHDL 2.8 06/11/2022    CHRONIC KIDNEY DISEASE with ANEMIA: Colton Guzman. presents for the medical management of Chronic Kidney Disease.  Is on ACE1/ARB therapy.  Is  avoiding NSAIDS.   Lab Results  Component Value Date   NA 133 (L) 12/17/2022   K 4.3 12/17/2022   CO2 26 12/17/2022   GLUCOSE 187 (H) 12/17/2022   BUN 21 12/17/2022   CREATININE 1.44 12/17/2022   CALCIUM 9.4 12/17/2022   GFR 52.73 (L) 12/17/2022   EGFR 62 04/20/2022   GFRNONAA 39 (L) 07/13/2022    GOUT:  Colton Guzman. presents for the medical management of gout.  Current medication: Allopurinol 100mg   Well controlled: yes Recent gout flare ups: no    The following portions of the patient's history were reviewed and updated as appropriate: past medical history, past surgical history, family history, social history, allergies, medications, and problem list.   Patient Active Problem List   Diagnosis Date Noted   Retinopathy of right eye 11/07/2022   Gastroesophageal reflux disease with esophagitis without hemorrhage 07/31/2022   Coronary artery disease involving native coronary artery of native heart without angina pectoris 05/19/2022   Diabetic polyneuropathy associated with type 2 diabetes mellitus (HCC) 05/19/2022   Chronic renal disease, stage 4, severely decreased glomerular filtration rate (GFR) between 15-29 mL/min/1.73 square meter (HCC) 11/14/2021   Insulin-requiring or dependent type II diabetes mellitus (HCC) 03/28/2021   Iron deficiency anemia due to chronic blood loss 08/26/2020   PAF (paroxysmal atrial fibrillation) (HCC) 08/12/2020   Sinus tachycardia    Vitamin B12 deficiency anemia due to intrinsic factor deficiency 05/19/2020  Primary osteoarthritis involving multiple joints 05/19/2020   Dyslipidemia, goal LDL below 70 05/18/2020   Z6XW (type 2 diabetes mellitus) (HCC) 02/22/2020   Hypertension    Gout    CAD (coronary artery disease) of artery bypass graft 02/2020    Past Medical History:  Diagnosis Date   Anemia    hx iron transfusion   Arthritis    left shoulder   CAD (coronary artery disease) of artery bypass graft 02/2020   Diabetes mellitus without complication (HCC)    type 2   Elevated blood uric acid level    GI bleed    History of blood transfusion 08/19/2020   Hyperlipidemia LDL goal <70    Hypertension    PAF (paroxysmal atrial fibrillation) (HCC)    Wears partial dentures    upper dent/lower partial   Past Surgical History:  Procedure Laterality Date   BIOPSY  08/17/2020   Procedure: BIOPSY;  Surgeon: Napoleon Form, MD;  [facility];  Service: Endoscopy;;   COLONOSCOPY WITH PROPOFOL Left 02/27/2020   Procedure: COLONOSCOPY WITH PROPOFOL;  Surgeon: Kathi Der, MD;  Location: [facility];  Service: Gastroenterology;  Laterality: Left;   COLONOSCOPY WITH PROPOFOL N/A 08/13/2020   Procedure: COLONOSCOPY WITH PROPOFOL;  Surgeon: Iva Boop, MD;  Location: [facility] ENDOSCOPY;  Service: Endoscopy;  Laterality: N/A;   CORONARY ARTERY BYPASS GRAFT N/A 03/08/2020   Procedure: CORONARY ARTERY BYPASS GRAFTING (CABG) x 5 ON PUMP;  Surgeon: Linden Dolin, MD;  Location: [facility] OR;  Service: Open Heart Surgery;  Laterality: N/A;   ENDOVEIN HARVEST OF GREATER SAPHENOUS VEIN Right 03/08/2020   Procedure: ENDOVEIN HARVEST OF GREATER SAPHENOUS VEIN;  Surgeon: Linden Dolin, MD;  [facility] OR;  Service: Open Heart Surgery;  Laterality: Right;   ESOPHAGOGASTRODUODENOSCOPY (EGD) WITH PROPOFOL N/A 02/27/2020   Procedure: ESOPHAGOGASTRODUODENOSCOPY (EGD) WITH PROPOFOL;  Surgeon: Kathi Der, MD;  [facility]: [facility];  Service: Gastroenterology;  Laterality: N/A;   ESOPHAGOGASTRODUODENOSCOPY (EGD) WITH PROPOFOL N/A 08/17/2020   Procedure: ESOPHAGOGASTRODUODENOSCOPY (EGD) WITH PROPOFOL;  Surgeon: Napoleon Form, MD;  [facility];  Service: Endoscopy;  Laterality: N/A;   INSERTION OF MESH Left 05/09/2021    Procedure: INSERTION OF MESH;  Surgeon: Axel Filler, MD;  Location: [facility] OR;  Service: General;  Laterality: Left;   LEFT HEART CATH AND CORONARY ANGIOGRAPHY N/A 03/04/2020   Procedure: LEFT HEART CATH AND CORONARY ANGIOGRAPHY;  Surgeon: [name], MD;  [facility];  Service: Cardiovascular;  Laterality: N/A;   RADIAL ARTERY HARVEST Left 03/08/2020   Procedure: RADIAL ARTERY HARVEST;  Surgeon: Linden Dolin, MD;  [facility];  Service: Open Heart Surgery;  Laterality: Left;   TEE WITHOUT CARDIOVERSION N/A 03/08/2020   Procedure: TRANSESOPHAGEAL ECHOCARDIOGRAM (TEE);  Surgeon: Linden Dolin, MD;  [facility] OR;  Service: Open Heart Surgery;  Laterality: N/A;   XI ROBOTIC ASSISTED INGUINAL HERNIA REPAIR WITH MESH Left 05/09/2021   Procedure: XI ROBOTIC ASSISTED LEFT INGUINAL HERNIA REPAIR WITH MESH;  Surgeon: Axel Filler, MD;  Location: [facility] OR;  Service: General;  Laterality: Left;   History reviewed. No pertinent family history.  Current Outpatient Medications:    Blood Pressure Monitoring (BLOOD PRESSURE DIGITAL SOLN) KIT, , Disp: , Rfl:    allopurinol (ZYLOPRIM) 100 MG tablet, Take 1 tablet (100 mg total) by mouth daily., Disp: 90 tablet, Rfl: 0   apixaban (ELIQUIS) 5 MG TABS tablet, Take 1 tablet (5 mg total) by mouth 2 (two) times  daily., Disp: 60 tablet, Rfl: 6   ASPIRIN LOW DOSE 81 MG EC tablet, Take 81 mg by mouth every 6 (six) hours as needed for moderate pain., Disp: , Rfl:    carvedilol (COREG) 12.5 MG tablet, Take 1 tablet (12.5 mg total) by mouth 2 (two) times daily., Disp: 180 tablet, Rfl: 0   clopidogrel (PLAVIX) 75 MG tablet, Take 1 tablet (75 mg total) by mouth daily., Disp: 90 tablet, Rfl: 3   Continuous Blood Gluc Receiver (FREESTYLE LIBRE 3 READER) DEVI, 1 Act by Does not apply route daily., Disp: 1 each, Rfl: 3   Continuous Glucose Sensor (FREESTYLE LIBRE 3 SENSOR) MISC, Place 1 sensor on the skin every 14 days. Use to check  glucose continuously, Disp: 2 each, Rfl: 5   Evolocumab (REPATHA SURECLICK) 140 MG/ML SOAJ, Inject 140 mg into the skin every 14 (fourteen) days., Disp: 2 mL, Rfl: 11   ezetimibe (ZETIA) 10 MG tablet, Take 1 tablet (10 mg total) by mouth daily., Disp: 90 tablet, Rfl: 3   FARXIGA 10 MG TABS tablet, Take 1 tablet (10 mg total) by mouth daily before breakfast., Disp: 90 tablet, Rfl: 3   Fe Bisgly-Vit C-Vit B12-FA (GENTLE IRON) 28-60-0.008-0.4 MG CAPS, Take 1 tablet by mouth in the morning., Disp: , Rfl:    icosapent Ethyl (VASCEPA) 1 g capsule, Take 2 capsules (2 g total) by mouth 2 (two) times daily., Disp: 360 capsule, Rfl: 3   isosorbide mononitrate (IMDUR) 30 MG 24 hr tablet, Take 1 tablet (30 mg total) by mouth daily., Disp: 90 tablet, Rfl: 2   Magnesium 300 MG CAPS, Take 300 mg by mouth daily., Disp: , Rfl:    Multiple Vitamin (MULTIVITAMIN WITH MINERALS) TABS tablet, Take 1 tablet by mouth daily., Disp: , Rfl:    pantoprazole (PROTONIX) 40 MG tablet, Take 1 tablet (40 mg total) 2 (two) times daily for 30 days, THEN 1 tablet (40 mg total) daily., Disp: 90 tablet, Rfl: 0   sacubitril-valsartan (ENTRESTO) 24-26 MG, Take 1 tablet by mouth 2 (two) times daily., Disp: 180 tablet, Rfl: 3   Semaglutide (RYBELSUS) 7 MG TABS, Take 1 tablet (7 mg total) by mouth daily., Disp: 90 tablet, Rfl: 0   spironolactone (ALDACTONE) 25 MG tablet, Take 1/2 tablet (12.5 mg total) by mouth daily., Disp: 45 tablet, Rfl: 3   traMADol (ULTRAM-ER) 100 MG 24 hr tablet, Take 1 tablet (100 mg total) by mouth daily as needed for pain., Disp: 90 tablet, Rfl: 1   vitamin B-12 (CYANOCOBALAMIN) 1000 MCG tablet, Take 1,000 mcg by mouth in the morning., Disp: , Rfl:  Allergies  Allergen Reactions   Atorvastatin Other (See Comments)    Significant elevation in LFTs while on statins   Crestor [Rosuvastatin] Other (See Comments)    Significant elevation in LFTs while on statins   Metformin And Related Other (See Comments)    Propofol Other (See Comments)    Encephalopathy     ROS: A complete ROS was performed with pertinent positives/negatives noted in the HPI. The remainder of the ROS are negative.    Objective:   Today's Vitals   12/17/22 1409  BP: 110/70  Pulse: 76  Temp: 98.7 F (37.1 C)  TempSrc: Temporal  SpO2: 96%  Weight: 185 lb 6.4 oz (84.1 kg)  Height: 5\' 7"  (1.702 m)    GENERAL: Well-appearing, in NAD. Well nourished.  SKIN: Pink, warm and dry.  NECK: Trachea midline. Full ROM w/o pain or tenderness. No lymphadenopathy.  RESPIRATORY:  Chest wall symmetrical. Respirations even and non-labored. Breath sounds clear to auscultation bilaterally.  CARDIAC: S1, S2 present, regular rate and rhythm. Peripheral pulses 2+ bilaterally.  EXTREMITIES: Without clubbing, cyanosis, or edema. No open wounds or ulcers on feet.  NEUROLOGIC: No motor or sensory deficits. Steady, even gait.  PSYCH/MENTAL STATUS: Alert, oriented x 3. Cooperative, appropriate mood and affect.   There are no preventive care reminders to display for this patient.   The ASCVD Risk score (Arnett DK, et al., 2019) failed to calculate for the following reasons:   The patient has a prior MI or stroke diagnosis     Assessment & Plan:  1. Type 2 diabetes mellitus with diabetic polyneuropathy, without long-term current use of insulin (HCC) - Comprehensive metabolic panel - Hemoglobin A1C  2. Primary hypertension - well controlled  - Comprehensive metabolic panel - TSH  3. Dyslipidemia, goal LDL below 70 - continue repatha injections   4. Chronic renal disease, stage 4, severely decreased glomerular filtration rate (GFR) between 15-29 mL/min/1.73 square meter (HCC) - Comprehensive metabolic panel  5. Iron deficiency anemia due to chronic blood loss - CBC with Differential/Platelet  6. Idiopathic chronic gout of multiple sites without tophus - stable, continue allopurinol 100mg  PO daily   7. Immunization due - Flu  vaccine trivalent PF, 6mos and older(Flulaval,Afluria,Fluarix,Fluzone)  Orders Placed This Encounter  Procedures   Flu vaccine trivalent PF, 6mos and older(Flulaval,Afluria,Fluarix,Fluzone)   CBC with Differential/Platelet   Comprehensive metabolic panel   TSH   Hemoglobin A1C    No orders of the defined types were placed in this encounter.   Return in about 4 months (around 04/19/2023) for Chronic Condition follow up.   [name], FNP

## 2022-12-19 ENCOUNTER — Other Ambulatory Visit: Payer: Self-pay | Admitting: Internal Medicine

## 2022-12-19 ENCOUNTER — Other Ambulatory Visit (HOSPITAL_COMMUNITY): Payer: Self-pay

## 2022-12-19 DIAGNOSIS — E1142 Type 2 diabetes mellitus with diabetic polyneuropathy: Secondary | ICD-10-CM

## 2022-12-19 MED ORDER — RYBELSUS 14 MG PO TABS
1.0000 | ORAL_TABLET | Freq: Every day | ORAL | 1 refills | Status: DC
Start: 2022-12-19 — End: 2023-07-16
  Filled 2022-12-19: qty 30, 30d supply, fill #0
  Filled 2023-03-20: qty 30, 30d supply, fill #1
  Filled 2023-03-27: qty 30, 30d supply, fill #2

## 2022-12-25 ENCOUNTER — Encounter: Payer: Self-pay | Admitting: Family

## 2022-12-25 ENCOUNTER — Other Ambulatory Visit: Payer: Self-pay

## 2022-12-25 ENCOUNTER — Other Ambulatory Visit: Payer: Self-pay | Admitting: Internal Medicine

## 2022-12-25 ENCOUNTER — Other Ambulatory Visit (HOSPITAL_COMMUNITY): Payer: Self-pay

## 2022-12-26 ENCOUNTER — Other Ambulatory Visit (HOSPITAL_COMMUNITY): Payer: Self-pay

## 2022-12-26 ENCOUNTER — Other Ambulatory Visit: Payer: Self-pay | Admitting: *Deleted

## 2022-12-26 MED ORDER — ISOSORBIDE MONONITRATE ER 30 MG PO TB24
30.0000 mg | ORAL_TABLET | Freq: Every day | ORAL | 2 refills | Status: DC
  Filled 2022-12-26: qty 30, 30d supply, fill #0

## 2023-01-01 ENCOUNTER — Other Ambulatory Visit: Payer: Self-pay

## 2023-01-01 ENCOUNTER — Other Ambulatory Visit (HOSPITAL_COMMUNITY): Payer: Self-pay

## 2023-01-02 ENCOUNTER — Encounter: Payer: Self-pay | Admitting: Family

## 2023-01-02 ENCOUNTER — Other Ambulatory Visit (HOSPITAL_COMMUNITY): Payer: Self-pay

## 2023-01-03 ENCOUNTER — Other Ambulatory Visit (HOSPITAL_COMMUNITY): Payer: Self-pay

## 2023-01-10 ENCOUNTER — Encounter: Payer: Self-pay | Admitting: Family

## 2023-01-12 ENCOUNTER — Other Ambulatory Visit: Payer: Self-pay

## 2023-01-14 ENCOUNTER — Encounter: Payer: Self-pay | Admitting: Family

## 2023-01-14 ENCOUNTER — Other Ambulatory Visit: Payer: Self-pay

## 2023-01-14 ENCOUNTER — Other Ambulatory Visit (HOSPITAL_COMMUNITY): Payer: Self-pay

## 2023-01-15 ENCOUNTER — Other Ambulatory Visit: Payer: Self-pay

## 2023-01-21 ENCOUNTER — Other Ambulatory Visit: Payer: Self-pay

## 2023-01-21 ENCOUNTER — Other Ambulatory Visit (HOSPITAL_COMMUNITY): Payer: Self-pay

## 2023-01-23 ENCOUNTER — Other Ambulatory Visit (HOSPITAL_COMMUNITY): Payer: Self-pay

## 2023-01-23 NOTE — Progress Notes (Unsigned)
No chief complaint on file.  History of Present Illness: 62 yo male with history of CAD s/p 5V CABG, DM, HTN, HLD who is here today for follow up. Colton Guzman has been followed in our office by Dr. Shari Prows. Colton Guzman was admitted in January 2022 with hematochezia and during his hospital stay Colton Guzman had chest pain and VT arrest requiring CPR. Cardiac cath 03/04/20 with multi-vessel CAD. Colton Guzman underwent 5V CABG on 03/08/20 (LIMA to LAD, SVG to PDA, SVG to PLA, left radial to intermediate branch and OM). Colton Guzman had atrial fib post arrest and wore a cardiac monitor in February 2022 which showed atrial fib. Colton Guzman was started on long term anti-coagulation with Eliquis. Colton Guzman was admitted with GI bleeding in June 2022 with evidence of severe diverticulosis without acting bleeding and non-bleeding gastric ulcers. Not felt to be a Watchman candidate due to anatomy of the left atrial appendage. Admitted to [facility] in August 2023 after witnessed cardiac arrest secondary to ventricular fibrillation. Cardiac cath with placement of a drug eluting stent in the diagonal branch. LVEF=10% and an Impella was placed. Hospital course was further complicated by cardiogenic shock, renal failure requiring HD, embolic stroke, and anemia from blood loss at impella site requiring multiple transfusions. Notably, Colton Guzman was maintained on apixaban, ASA and ticagrelor. Entresto added at office visit in November 2023. Echo May 2024 with LVEF=50-55%. Mild RV systolic dysfunction. Colton Guzman was doing well when seen in clinic in June 2024 by Dr. Shari Prows.   Colton Guzman is here today for follow up. The patient denies any chest pain, dyspnea, palpitations, lower extremity edema, orthopnea, PND, dizziness, near syncope or syncope.   Primary Care Physician: Etta Grandchild, MD   Past Medical History:  Diagnosis Date   Anemia    hx iron transfusion   Arthritis    left shoulder   CAD (coronary artery disease) of artery bypass graft 02/2020   Diabetes mellitus without  complication (HCC)    type 2   Elevated blood uric acid level    GI bleed    History of blood transfusion 08/19/2020   Hyperlipidemia LDL goal <70    Hypertension    PAF (paroxysmal atrial fibrillation) (HCC)    Wears partial dentures    upper dent/lower partial    Past Surgical History:  Procedure Laterality Date   BIOPSY  08/17/2020   Procedure: BIOPSY;  Surgeon: Napoleon Form, MD;  [facility];  Service: Endoscopy;;   COLONOSCOPY WITH PROPOFOL Left 02/27/2020   Procedure: COLONOSCOPY WITH PROPOFOL;  Surgeon: Kathi Der, MD;  [facility]: [facility];  Service: Gastroenterology;  Laterality: Left;   COLONOSCOPY WITH PROPOFOL N/A 08/13/2020   Procedure: COLONOSCOPY WITH PROPOFOL;  Surgeon: Iva Boop, MD;  Location: [facility] ENDOSCOPY;  Service: Endoscopy;  Laterality: N/A;   CORONARY ARTERY BYPASS GRAFT N/A 03/08/2020   Procedure: CORONARY ARTERY BYPASS GRAFTING (CABG) x 5 ON PUMP;  Surgeon: Linden Dolin, MD;  [facility] OR;  Service: Open Heart Surgery;  Laterality: N/A;   ENDOVEIN HARVEST OF GREATER SAPHENOUS VEIN Right 03/08/2020   Procedure: ENDOVEIN HARVEST OF GREATER SAPHENOUS VEIN;  Surgeon: Linden Dolin, MD;  [facility] OR;  Service: Open Heart Surgery;  Laterality: Right;   ESOPHAGOGASTRODUODENOSCOPY (EGD) WITH PROPOFOL N/A 02/27/2020   Procedure: ESOPHAGOGASTRODUODENOSCOPY (EGD) WITH PROPOFOL;  Surgeon: Kathi Der, MD;  [facility]: [facility];  Service: Gastroenterology;  Laterality: N/A;   ESOPHAGOGASTRODUODENOSCOPY (EGD) WITH PROPOFOL N/A 08/17/2020   Procedure: ESOPHAGOGASTRODUODENOSCOPY (EGD) WITH PROPOFOL;  Surgeon: Napoleon Form, MD;  Location[facility]OSCOPY;  Service: Endoscopy;  Laterality: N/A;   INSERTION OF MESH Left 05/09/2021   Procedure: INSERTION OF MESH;  Surgeon: Axel Filler, MD;  Location: [facility] OR;  Service: General;  Laterality: Left;   LEFT HEART CATH AND CORONARY ANGIOGRAPHY N/A 03/04/2020   Procedure: LEFT HEART  CATH AND CORONARY ANGIOGRAPHY;  Surgeon: [name], MD;  [facility];  Service: Cardiovascular;  Laterality: N/A;   RADIAL ARTERY HARVEST Left 03/08/2020   Procedure: RADIAL ARTERY HARVEST;  Surgeon: Linden Dolin, MD;  [facility];  Service: Open Heart Surgery;  Laterality: Left;   TEE WITHOUT CARDIOVERSION N/A 03/08/2020   Procedure: TRANSESOPHAGEAL ECHOCARDIOGRAM (TEE);  Surgeon: Linden Dolin, MD;  Location: [facility] OR;  Service: Open Heart Surgery;  Laterality: N/A;   XI ROBOTIC ASSISTED INGUINAL HERNIA REPAIR WITH MESH Left 05/09/2021   Procedure: XI ROBOTIC ASSISTED LEFT INGUINAL HERNIA REPAIR WITH MESH;  Surgeon: Axel Filler, MD;  [facility] OR;  Service: General;  Laterality: Left;    Current Outpatient Medications  Medication Sig Dispense Refill   allopurinol (ZYLOPRIM) 100 MG tablet Take 1 tablet (100 mg total) by mouth daily. 90 tablet 0   apixaban (ELIQUIS) 5 MG TABS tablet Take 1 tablet (5 mg total) by mouth 2 (two) times daily. 60 tablet 6   ASPIRIN LOW DOSE 81 MG EC tablet Take 81 mg by mouth every 6 (six) hours as needed for moderate pain.     Blood Pressure Monitoring (BLOOD PRESSURE DIGITAL SOLN) KIT      carvedilol (COREG) 12.5 MG tablet Take 1 tablet (12.5 mg total) by mouth 2 (two) times daily. 180 tablet 0   clopidogrel (PLAVIX) 75 MG tablet Take 1 tablet (75 mg total) by mouth daily. 90 tablet 3   Continuous Blood Gluc Receiver (FREESTYLE LIBRE 3 READER) DEVI 1 Act by Does not apply route daily. 1 each 3   Continuous Glucose Sensor (FREESTYLE LIBRE 3 SENSOR) MISC Place 1 sensor on the skin every 14 days. Use to check glucose continuously 2 each 5   Evolocumab (REPATHA SURECLICK) 140 MG/ML SOAJ Inject 140 mg into the skin every 14 (fourteen) days. 2 mL 11   ezetimibe (ZETIA) 10 MG tablet Take 1 tablet (10 mg total) by mouth daily. 90 tablet 3   FARXIGA 10 MG TABS tablet Take 1 tablet (10 mg total) by mouth daily before breakfast.  90 tablet 3   Fe Bisgly-Vit C-Vit B12-FA (GENTLE IRON) 28-60-0.008-0.4 MG CAPS Take 1 tablet by mouth in the morning.     icosapent Ethyl (VASCEPA) 1 g capsule Take 2 capsules (2 g total) by mouth 2 (two) times daily. 360 capsule 3   isosorbide mononitrate (IMDUR) 30 MG 24 hr tablet Take 1 tablet (30 mg total) by mouth daily. 90 tablet 2   Magnesium 300 MG CAPS Take 300 mg by mouth daily.     Multiple Vitamin (MULTIVITAMIN WITH MINERALS) TABS tablet Take 1 tablet by mouth daily.     pantoprazole (PROTONIX) 40 MG tablet Take 1 tablet (40 mg total) 2 (two) times daily for 30 days, THEN 1 tablet (40 mg total) daily. 90 tablet 0   sacubitril-valsartan (ENTRESTO) 24-26 MG Take 1 tablet by mouth 2 (two) times daily. 180 tablet 3   Semaglutide (RYBELSUS) 14 MG TABS Take 1 tablet (14 mg total) by mouth daily. 90 tablet 1   spironolactone (ALDACTONE) 25 MG tablet Take 1/2 tablet (12.5  mg total) by mouth daily. 45 tablet 3   traMADol (ULTRAM-ER) 100 MG 24 hr tablet Take 1 tablet (100 mg total) by mouth daily as needed for pain. 90 tablet 1   vitamin B-12 (CYANOCOBALAMIN) 1000 MCG tablet Take 1,000 mcg by mouth in the morning.     No current facility-administered medications for this visit.    Allergies  Allergen Reactions   Atorvastatin Other (See Comments)    Significant elevation in LFTs while on statins   Crestor [Rosuvastatin] Other (See Comments)    Significant elevation in LFTs while on statins   Metformin And Related Other (See Comments)   Propofol Other (See Comments)    Encephalopathy    Social History   Socioeconomic History   Marital status: Married    Spouse name: Not on file   Number of children: Not on file   Years of education: Not on file   Highest education level: Bachelor's degree (e.g., BA, AB, BS)  Occupational History   Not on file  Tobacco Use   Smoking status: Former    Current packs/day: 0.00    Types: Cigarettes    Quit date: 2012    Years since quitting: 12.9    Smokeless tobacco: Never  Vaping Use   Vaping status: Former   Quit date: 08/28/2010  Substance and Sexual Activity   Alcohol use: Not Currently    Comment: stopped drinking 02/2020   Drug use: Yes    Types: Marijuana    Comment: over 30 yrs ago as of 05/03/21   Sexual activity: Not Currently  Other Topics Concern   Not on file  Social History Narrative   Not on file   Social Determinants of Health   Financial Resource Strain: Medium Risk (12/13/2022)   Overall Financial Resource Strain (CARDIA)    Difficulty of Paying Living Expenses: Somewhat hard  Food Insecurity: Food Insecurity Present (12/13/2022)   Hunger Vital Sign    Worried About Running Out of Food in the Last Year: Sometimes true    Ran Out of Food in the Last Year: Patient declined  Transportation Needs: No Transportation Needs (12/13/2022)   PRAPARE - Administrator, Civil Service (Medical): No    Lack of Transportation (Non-Medical): No  Physical Activity: Insufficiently Active (05/11/2022)   Exercise Vital Sign    Days of Exercise per Week: 3 days    Minutes of Exercise per Session: 30 min  Stress: Stress Concern Present (05/11/2022)   [employer] of Occupational Health - Occupational Stress Questionnaire    Feeling of Stress : To some extent  Social Connections: Socially Integrated (12/13/2022)   Social Connection and Isolation Panel [NHANES]    Frequency of Communication with Friends and Family: Twice a week    Frequency of Social Gatherings with Friends and Family: Once a week    Attends Religious Services: More than 4 times per year    Active Member of [employer] or Organizations: Yes    Attends Banker Meetings: 1 to 4 times per year    Marital Status: Married  Catering manager Violence: Not on file    No family history on file.  Review of Systems:  As stated in the HPI and otherwise negative.   There were no vitals taken for this visit.  Physical Examination: General:  Well developed, well nourished, NAD  HEENT: OP clear, mucus membranes moist  SKIN: warm, dry. No rashes. Neuro: No focal deficits  Musculoskeletal: Muscle strength 5/5 all  ext  Psychiatric: Mood and affect normal  Neck: No JVD, no carotid bruits, no thyromegaly, no lymphadenopathy.  Lungs:Clear bilaterally, no wheezes, rhonci, crackles Cardiovascular: Regular rate and rhythm. No murmurs, gallops or rubs. Abdomen:Soft. Bowel sounds present. Non-tender.  Extremities: No lower extremity edema. Pulses are 2 + in the bilateral DP/PT.  EKG:  EKG {ACTION; IS/IS [id number]} ordered today. The ekg ordered today demonstrates ***  Recent Labs: 12/17/2022: ALT 20; BUN 21; Creatinine, Ser 1.44; Hemoglobin 13.9; Platelets 158.0; Potassium 4.3; Sodium 133; TSH 1.80   Lipid Panel    Component Value Date/Time   CHOL 88 (L) 06/11/2022 0830   TRIG 175 (H) 06/11/2022 0830   HDL 31 (L) 06/11/2022 0830   CHOLHDL 2.8 06/11/2022 0830   CHOLHDL 4 07/26/2020 1144   VLDL 53.6 (H) 07/26/2020 1144   LDLCALC 28 06/11/2022 0830   LDLDIRECT 93.0 07/26/2020 1144     Wt Readings from Last 3 Encounters:  12/17/22 84.1 kg  08/28/22 81.6 kg  08/01/22 80.9 kg    Assessment and Plan:   1. CAD s/p CABG without angina: Colton Guzman has no chest pain suggestive of angina. Continue ASA, Plavix, beta blocker and Imdur. Colton Guzman did not tolerate statins due to elevated LFTs. Colton Guzman is on Zetia and Repatha.   2. Ischemic cardiomyopathy: LVEF=50% by echo May 2024. Continue beta blocker, Entresto, aldactone and [place].   3. HLD: LDL at goal in April 2024. Continue Zetia and Repatha.   4. HTN: BP is well controlled. No changes  5. PAF: Sinus today. Continue beta blocker and Eliquis  Labs/ tests ordered today include:  No orders of the defined types were placed in this encounter.  Disposition:   F/U with me in ***    Signed, Verne Carrow, MD, [facility] 01/23/2023 3:31 PM    [facility] [address]  [place], [place], [place]  [address] Phone: [phone]; Fax: [phone]

## 2023-01-24 ENCOUNTER — Encounter: Payer: Self-pay | Admitting: Cardiovascular Disease

## 2023-01-24 ENCOUNTER — Encounter (HOSPITAL_COMMUNITY): Payer: Self-pay

## 2023-01-24 ENCOUNTER — Other Ambulatory Visit (HOSPITAL_COMMUNITY): Payer: Self-pay

## 2023-01-24 ENCOUNTER — Ambulatory Visit: Payer: Managed Care, Other (non HMO) | Attending: Cardiovascular Disease | Admitting: Cardiovascular Disease

## 2023-01-24 VITALS — BP 128/82 | HR 77 | Ht 67.0 in | Wt 184.6 lb

## 2023-01-24 DIAGNOSIS — I480 Paroxysmal atrial fibrillation: Secondary | ICD-10-CM | POA: Insufficient documentation

## 2023-01-24 DIAGNOSIS — I10 Essential (primary) hypertension: Secondary | ICD-10-CM | POA: Insufficient documentation

## 2023-01-24 DIAGNOSIS — E782 Mixed hyperlipidemia: Secondary | ICD-10-CM | POA: Insufficient documentation

## 2023-01-24 DIAGNOSIS — Z79899 Other long term (current) drug therapy: Secondary | ICD-10-CM | POA: Diagnosis not present

## 2023-01-24 DIAGNOSIS — I255 Ischemic cardiomyopathy: Secondary | ICD-10-CM | POA: Diagnosis not present

## 2023-01-24 DIAGNOSIS — I2510 Atherosclerotic heart disease of native coronary artery without angina pectoris: Secondary | ICD-10-CM | POA: Insufficient documentation

## 2023-01-24 MED ORDER — REPATHA SURECLICK 140 MG/ML ~~LOC~~ SOAJ
1.0000 mL | SUBCUTANEOUS | 11 refills | Status: DC
  Filled 2023-02-11: qty 2, 28d supply, fill #0
  Filled 2023-03-20: qty 2, 28d supply, fill #1

## 2023-01-24 MED ORDER — EZETIMIBE 10 MG PO TABS
10.0000 mg | ORAL_TABLET | Freq: Every day | ORAL | 3 refills | Status: DC
  Filled 2023-01-24 – 2023-01-25 (×2): qty 30, 30d supply, fill #0
  Filled 2023-01-28: qty 90, 90d supply, fill #0

## 2023-01-24 MED ORDER — ISOSORBIDE MONONITRATE ER 30 MG PO TB24
30.0000 mg | ORAL_TABLET | Freq: Every day | ORAL | 3 refills | Status: DC
  Filled 2023-01-24: qty 30, 30d supply, fill #0
  Filled 2023-02-11 – 2023-03-27 (×2): qty 30, 30d supply, fill #1

## 2023-01-24 NOTE — Patient Instructions (Signed)
Medication Instructions:  Stop Plavix (clopidogrel)  *If you need a refill on your cardiac medications before your next appointment, please call your pharmacy*   Lab Work: none   Testing/Procedures: none   Follow-Up: At [facility], you and your health needs are our priority.  As part of our continuing mission to provide you with exceptional heart care, we have created designated Provider Care Teams.  These Care Teams include your primary Cardiologist (physician) and Advanced Practice Providers (APPs -  Physician Assistants and Nurse Practitioners) who all work together to provide you with the care you need, when you need it. \  Your next appointment:   6 month(s)  Provider:   Robin Searing, NP, Tereso Newcomer, PA-C, or Perlie Gold, PA-C

## 2023-01-25 ENCOUNTER — Other Ambulatory Visit (HOSPITAL_COMMUNITY): Payer: Self-pay

## 2023-01-25 ENCOUNTER — Other Ambulatory Visit: Payer: Self-pay

## 2023-01-28 ENCOUNTER — Encounter: Payer: Self-pay | Admitting: Family

## 2023-01-28 ENCOUNTER — Other Ambulatory Visit (HOSPITAL_COMMUNITY): Payer: Self-pay

## 2023-01-29 ENCOUNTER — Other Ambulatory Visit (HOSPITAL_COMMUNITY): Payer: Self-pay

## 2023-01-29 ENCOUNTER — Encounter: Payer: Self-pay | Admitting: Family

## 2023-02-06 ENCOUNTER — Other Ambulatory Visit (HOSPITAL_COMMUNITY): Payer: Self-pay

## 2023-02-08 ENCOUNTER — Telehealth: Payer: Self-pay | Admitting: Internal Medicine

## 2023-02-08 NOTE — Telephone Encounter (Signed)
Patient dropped off document Home Health Certificate (Order ID [id number]), to be filled out by provider. Patient requested to send it back via Call Patient to pick up within 7-days. Document is located in providers tray at front office.Please advise at Mobile [phone] (mobile)

## 2023-02-11 ENCOUNTER — Other Ambulatory Visit (HOSPITAL_COMMUNITY): Payer: Self-pay

## 2023-02-11 ENCOUNTER — Other Ambulatory Visit: Payer: Self-pay

## 2023-02-11 ENCOUNTER — Encounter: Payer: Self-pay | Admitting: Family

## 2023-02-11 NOTE — Telephone Encounter (Signed)
Forms placed in PCP folder.

## 2023-02-15 ENCOUNTER — Encounter: Payer: Self-pay | Admitting: Family

## 2023-02-18 ENCOUNTER — Other Ambulatory Visit (HOSPITAL_COMMUNITY): Payer: Self-pay

## 2023-02-19 DIAGNOSIS — Z0279 Encounter for issue of other medical certificate: Secondary | ICD-10-CM

## 2023-03-12 ENCOUNTER — Telehealth: Payer: Self-pay

## 2023-03-12 NOTE — Telephone Encounter (Signed)
Attempted to call patient phone rang no voicemail to leave message  Copied from CRM [id number]. Topic: General - Other >> Mar 12, 2023 11:23 AM Colton Guzman wrote: Reason for CRM: Colton Guzman form [employer] called confirming patient phone number, stated that is the phone number on file but is unable to reach patient, is trying to get in contact with patient and would like for him to contact them back

## 2023-03-20 ENCOUNTER — Encounter: Payer: Self-pay | Admitting: Family

## 2023-03-20 ENCOUNTER — Other Ambulatory Visit (HOSPITAL_COMMUNITY): Payer: Self-pay

## 2023-03-27 ENCOUNTER — Other Ambulatory Visit: Payer: Self-pay | Admitting: Internal Medicine

## 2023-03-27 ENCOUNTER — Other Ambulatory Visit: Payer: Self-pay

## 2023-03-27 ENCOUNTER — Other Ambulatory Visit (HOSPITAL_COMMUNITY): Payer: Self-pay

## 2023-03-27 DIAGNOSIS — K2100 Gastro-esophageal reflux disease with esophagitis, without bleeding: Secondary | ICD-10-CM

## 2023-03-27 DIAGNOSIS — M1A09X0 Idiopathic chronic gout, multiple sites, without tophus (tophi): Secondary | ICD-10-CM

## 2023-03-27 DIAGNOSIS — M150 Primary generalized (osteo)arthritis: Secondary | ICD-10-CM

## 2023-03-27 DIAGNOSIS — E1142 Type 2 diabetes mellitus with diabetic polyneuropathy: Secondary | ICD-10-CM

## 2023-03-28 ENCOUNTER — Encounter (HOSPITAL_COMMUNITY): Payer: Self-pay

## 2023-03-28 ENCOUNTER — Other Ambulatory Visit (HOSPITAL_COMMUNITY): Payer: Self-pay

## 2023-04-02 ENCOUNTER — Telehealth: Payer: Self-pay

## 2023-04-02 ENCOUNTER — Other Ambulatory Visit (HOSPITAL_COMMUNITY): Payer: Self-pay

## 2023-04-02 NOTE — Telephone Encounter (Signed)
Pharmacy Patient Advocate Encounter   Received notification from CoverMyMeds that prior authorization for [facility] is required/requested.   Insurance verification completed.   The patient is insured through [facility] .   Per test claim:  JARDIANCE is preferred by the insurance.  If suggested medication is appropriate, Please send in a new RX and discontinue this one. If not, please advise as to why it's not appropriate so that we may request a Prior Authorization. Please note, some preferred medications may still require a PA.  If the suggested medications have not been trialed and there are no contraindications to their use, the PA will not be submitted, as it will not be approved.

## 2023-04-04 ENCOUNTER — Other Ambulatory Visit: Payer: Self-pay

## 2023-04-04 ENCOUNTER — Inpatient Hospital Stay (HOSPITAL_COMMUNITY)
Admission: EM | Admit: 2023-04-04 | Discharge: 2023-04-06 | DRG: 286 | Disposition: A | Discharge status: Home / Self Care | Payer: Medicare Other | Attending: Internal Medicine | Admitting: Internal Medicine

## 2023-04-04 ENCOUNTER — Emergency Department (HOSPITAL_COMMUNITY): Payer: Medicare Other

## 2023-04-04 DIAGNOSIS — Z7901 Long term (current) use of anticoagulants: Secondary | ICD-10-CM

## 2023-04-04 DIAGNOSIS — I25110 Atherosclerotic heart disease of native coronary artery with unstable angina pectoris: Secondary | ICD-10-CM | POA: Diagnosis present

## 2023-04-04 DIAGNOSIS — K5791 Diverticulosis of intestine, part unspecified, without perforation or abscess with bleeding: Secondary | ICD-10-CM | POA: Diagnosis present

## 2023-04-04 DIAGNOSIS — Z5986 Financial insecurity: Secondary | ICD-10-CM

## 2023-04-04 DIAGNOSIS — Z8711 Personal history of peptic ulcer disease: Secondary | ICD-10-CM | POA: Diagnosis not present

## 2023-04-04 DIAGNOSIS — Z8673 Personal history of transient ischemic attack (TIA), and cerebral infarction without residual deficits: Secondary | ICD-10-CM | POA: Diagnosis not present

## 2023-04-04 DIAGNOSIS — E119 Type 2 diabetes mellitus without complications: Secondary | ICD-10-CM

## 2023-04-04 DIAGNOSIS — Z7984 Long term (current) use of oral hypoglycemic drugs: Secondary | ICD-10-CM

## 2023-04-04 DIAGNOSIS — R0789 Other chest pain: Secondary | ICD-10-CM | POA: Diagnosis not present

## 2023-04-04 DIAGNOSIS — Z87891 Personal history of nicotine dependence: Secondary | ICD-10-CM | POA: Diagnosis not present

## 2023-04-04 DIAGNOSIS — I25810 Atherosclerosis of coronary artery bypass graft(s) without angina pectoris: Secondary | ICD-10-CM | POA: Diagnosis present

## 2023-04-04 DIAGNOSIS — Z8674 Personal history of sudden cardiac arrest: Secondary | ICD-10-CM | POA: Diagnosis not present

## 2023-04-04 DIAGNOSIS — N179 Acute kidney failure, unspecified: Secondary | ICD-10-CM | POA: Diagnosis present

## 2023-04-04 DIAGNOSIS — E782 Mixed hyperlipidemia: Secondary | ICD-10-CM

## 2023-04-04 DIAGNOSIS — K219 Gastro-esophageal reflux disease without esophagitis: Secondary | ICD-10-CM | POA: Diagnosis present

## 2023-04-04 DIAGNOSIS — M150 Primary generalized (osteo)arthritis: Secondary | ICD-10-CM

## 2023-04-04 DIAGNOSIS — E1169 Type 2 diabetes mellitus with other specified complication: Secondary | ICD-10-CM | POA: Diagnosis present

## 2023-04-04 DIAGNOSIS — Z79899 Other long term (current) drug therapy: Secondary | ICD-10-CM | POA: Diagnosis not present

## 2023-04-04 DIAGNOSIS — I152 Hypertension secondary to endocrine disorders: Secondary | ICD-10-CM | POA: Diagnosis present

## 2023-04-04 DIAGNOSIS — E1142 Type 2 diabetes mellitus with diabetic polyneuropathy: Secondary | ICD-10-CM

## 2023-04-04 DIAGNOSIS — Z8719 Personal history of other diseases of the digestive system: Secondary | ICD-10-CM

## 2023-04-04 DIAGNOSIS — I2582 Chronic total occlusion of coronary artery: Secondary | ICD-10-CM | POA: Diagnosis present

## 2023-04-04 DIAGNOSIS — E785 Hyperlipidemia, unspecified: Secondary | ICD-10-CM | POA: Diagnosis present

## 2023-04-04 DIAGNOSIS — R079 Chest pain, unspecified: Secondary | ICD-10-CM | POA: Diagnosis present

## 2023-04-04 DIAGNOSIS — I10 Essential (primary) hypertension: Secondary | ICD-10-CM

## 2023-04-04 DIAGNOSIS — Z7982 Long term (current) use of aspirin: Secondary | ICD-10-CM | POA: Diagnosis not present

## 2023-04-04 DIAGNOSIS — E1159 Type 2 diabetes mellitus with other circulatory complications: Secondary | ICD-10-CM | POA: Diagnosis present

## 2023-04-04 DIAGNOSIS — I25702 Atherosclerosis of coronary artery bypass graft(s), unspecified, with refractory angina pectoris: Secondary | ICD-10-CM | POA: Diagnosis not present

## 2023-04-04 DIAGNOSIS — I480 Paroxysmal atrial fibrillation: Secondary | ICD-10-CM | POA: Diagnosis present

## 2023-04-04 DIAGNOSIS — I255 Ischemic cardiomyopathy: Secondary | ICD-10-CM | POA: Diagnosis present

## 2023-04-04 DIAGNOSIS — Z955 Presence of coronary angioplasty implant and graft: Secondary | ICD-10-CM

## 2023-04-04 DIAGNOSIS — Z7902 Long term (current) use of antithrombotics/antiplatelets: Secondary | ICD-10-CM | POA: Diagnosis not present

## 2023-04-04 DIAGNOSIS — Z888 Allergy status to other drugs, medicaments and biological substances status: Secondary | ICD-10-CM

## 2023-04-04 LAB — COMPREHENSIVE METABOLIC PANEL
ALT: 27 U/L (ref 0–44)
AST: 26 U/L (ref 15–41)
Albumin: 3.7 g/dL (ref 3.5–5.0)
Alkaline Phosphatase: 47 U/L (ref 38–126)
Anion gap: 12 (ref 5–15)
BUN: 18 mg/dL (ref 8–23)
CO2: 22 mmol/L (ref 22–32)
Calcium: 9.4 mg/dL (ref 8.9–10.3)
Chloride: 101 mmol/L (ref 98–111)
Creatinine, Ser: 1.33 mg/dL — ABNORMAL HIGH (ref 0.61–1.24)
GFR, Estimated: >60 mL/min (ref >60)
Glucose, Bld: 224 mg/dL — ABNORMAL HIGH (ref 70–99)
Potassium: 3.8 mmol/L (ref 3.5–5.1)
Sodium: 135 mmol/L (ref 135–145)
Total Bilirubin: 0.8 mg/dL (ref 0.0–1.2)
Total Protein: 7.5 g/dL (ref 6.5–8.1)

## 2023-04-04 LAB — CBC WITH DIFFERENTIAL/PLATELET
Abs Immature Granulocytes: 0.04 10*3/uL (ref 0.00–0.07)
Basophils Absolute: 0 10*3/uL (ref 0.0–0.1)
Basophils Relative: 0 %
Eosinophils Absolute: 0.2 10*3/uL (ref 0.0–0.5)
Eosinophils Relative: 2 %
HCT: 40.1 % (ref 39.0–52.0)
Hemoglobin: 13.8 g/dL (ref 13.0–17.0)
Immature Granulocytes: 0 %
Lymphocytes Relative: 17 %
Lymphs Abs: 1.9 10*3/uL (ref 0.7–4.0)
MCH: 32.3 pg (ref 26.0–34.0)
MCHC: 34.4 g/dL (ref 30.0–36.0)
MCV: 93.9 fL (ref 80.0–100.0)
Monocytes Absolute: 0.6 10*3/uL (ref 0.1–1.0)
Monocytes Relative: 5 %
Neutro Abs: 8.6 10*3/uL — ABNORMAL HIGH (ref 1.7–7.7)
Neutrophils Relative %: 76 %
Platelets: 154 10*3/uL (ref 150–400)
RBC: 4.27 MIL/uL (ref 4.22–5.81)
RDW: 13.1 % (ref 11.5–15.5)
WBC: 11.3 10*3/uL — ABNORMAL HIGH (ref 4.0–10.5)
nRBC: 0 % (ref 0.0–0.2)

## 2023-04-04 LAB — TROPONIN I (HIGH SENSITIVITY)
Troponin I (High Sensitivity): 4 ng/L (ref <18)
Troponin I (High Sensitivity): 4 ng/L (ref <18)

## 2023-04-04 LAB — CBG MONITORING, ED: Glucose-Capillary: 223 mg/dL — ABNORMAL HIGH (ref 70–99)

## 2023-04-04 MED ORDER — INSULIN ASPART 100 UNIT/ML IJ SOLN
0.0000 [IU] | Freq: Three times a day (TID) | INTRAMUSCULAR | Status: DC
  Administered 2023-04-05: 3 [IU] via SUBCUTANEOUS
  Administered 2023-04-06: 5 [IU] via SUBCUTANEOUS

## 2023-04-04 MED ORDER — ONDANSETRON HCL 4 MG/2ML IJ SOLN: 4.0000 mg | Freq: Four times a day (QID) | INTRAMUSCULAR | Status: DC | PRN

## 2023-04-04 MED ORDER — CARVEDILOL 12.5 MG PO TABS
12.5000 mg | ORAL_TABLET | Freq: Two times a day (BID) | ORAL | Status: DC
  Administered 2023-04-04 – 2023-04-06 (×4): 12.5 mg via ORAL
  Filled 2023-04-04 (×4): qty 1

## 2023-04-04 MED ORDER — ACETAMINOPHEN 650 MG RE SUPP: 650.0000 mg | Freq: Four times a day (QID) | RECTAL | Status: DC | PRN

## 2023-04-04 MED ORDER — ONDANSETRON HCL 4 MG PO TABS: 4.0000 mg | ORAL_TABLET | Freq: Four times a day (QID) | ORAL | Status: DC | PRN

## 2023-04-04 MED ORDER — FAMOTIDINE 20 MG PO TABS
20.0000 mg | ORAL_TABLET | Freq: Once | ORAL | Status: AC
  Administered 2023-04-04: 20 mg via ORAL
  Filled 2023-04-04: qty 1

## 2023-04-04 MED ORDER — HEPARIN (PORCINE) 25000 UT/250ML-% IV SOLN
1100.0000 [IU]/h | INTRAVENOUS | Status: DC
  Administered 2023-04-04: 1100 [IU]/h via INTRAVENOUS
  Filled 2023-04-04: qty 250

## 2023-04-04 MED ORDER — ASPIRIN 81 MG PO TBEC
81.0000 mg | DELAYED_RELEASE_TABLET | Freq: Every day | ORAL | Status: DC
  Administered 2023-04-05 – 2023-04-06 (×2): 81 mg via ORAL
  Filled 2023-04-04 (×2): qty 1

## 2023-04-04 MED ORDER — ACETAMINOPHEN 325 MG PO TABS: 650.0000 mg | ORAL_TABLET | Freq: Four times a day (QID) | ORAL | Status: DC | PRN

## 2023-04-04 MED ORDER — SODIUM CHLORIDE 0.9% FLUSH
3.0000 mL | Freq: Two times a day (BID) | INTRAVENOUS | Status: DC
  Administered 2023-04-04 – 2023-04-06 (×4): 3 mL via INTRAVENOUS

## 2023-04-04 MED ORDER — ISOSORBIDE MONONITRATE ER 30 MG PO TB24
30.0000 mg | ORAL_TABLET | Freq: Every day | ORAL | Status: DC
  Administered 2023-04-05 – 2023-04-06 (×2): 30 mg via ORAL
  Filled 2023-04-04 (×2): qty 1

## 2023-04-04 MED ORDER — INSULIN ASPART 100 UNIT/ML IJ SOLN
0.0000 [IU] | Freq: Every day | INTRAMUSCULAR | Status: DC
  Administered 2023-04-04: 2 [IU] via SUBCUTANEOUS
  Administered 2023-04-05: 3 [IU] via SUBCUTANEOUS

## 2023-04-04 MED ORDER — SENNOSIDES-DOCUSATE SODIUM 8.6-50 MG PO TABS: 1.0000 | ORAL_TABLET | Freq: Every evening | ORAL | Status: DC | PRN

## 2023-04-04 MED ORDER — EMPAGLIFLOZIN 10 MG PO TABS: 10.0000 mg | ORAL_TABLET | Freq: Every day | ORAL | 12 refills | Status: DC

## 2023-04-04 MED ORDER — EZETIMIBE 10 MG PO TABS
10.0000 mg | ORAL_TABLET | Freq: Every day | ORAL | Status: DC
  Administered 2023-04-05 – 2023-04-06 (×2): 10 mg via ORAL
  Filled 2023-04-04 (×2): qty 1

## 2023-04-04 NOTE — Telephone Encounter (Signed)
Call to inform patient, N/A LVM.

## 2023-04-04 NOTE — Progress Notes (Signed)
PHARMACY - ANTICOAGULATION CONSULT NOTE  Pharmacy Consult for heparin Indication: chest pain/ACS  Allergies  Allergen Reactions   Atorvastatin Other (See Comments)    Significant elevation in LFTs while on statins   Crestor [Rosuvastatin] Other (See Comments)    Significant elevation in LFTs while on statins   Metformin And Related Other (See Comments)   Propofol Other (See Comments)    Encephalopathy    Patient Measurements: Height: 5\' 8"  (172.7 cm) Weight: 83.5 kg (184 lb) IBW/kg (Calculated) : 68.4 Heparin Dosing Weight: TBW  Vital Signs: Temp: 98.4 F (36.9 C) (02/13 1650) Temp Source: Oral (02/13 1650) BP: 113/71 (02/13 1745) Pulse Rate: 84 (02/13 1745)  Labs: Recent Labs    04/04/23 1413 04/04/23 1605  HGB 13.8  --   HCT 40.1  --   PLT 154  --   CREATININE 1.33*  --   TROPONINIHS 4 4    Estimated Creatinine Clearance: 61.4 mL/min (A) (by C-G formula based on SCr of 1.33 mg/dL (H)).   Medical History: Past Medical History:  Diagnosis Date   Anemia    hx iron transfusion   Arthritis    left shoulder   CAD (coronary artery disease) of artery bypass graft 02/2020   Diabetes mellitus without complication (HCC)    type 2   Elevated blood uric acid level    GI bleed    History of blood transfusion 08/19/2020   Hyperlipidemia LDL goal <70    Hypertension    PAF (paroxysmal atrial fibrillation) (HCC)    Wears partial dentures    upper dent/lower partial    Assessment: 47 YOM presenting with chest discomfort, hx of CAD, hx of afib on Eliquis PTA, last dose taken 2/13 @0900   Goal of Therapy:  Heparin level 0.3-0.7 units/ml aPTT 66-102 seconds Monitor platelets by anticoagulation protocol: Yes   Plan:  Heparin gtt at 1100 units/hr, no bolus F/u 6 hour aPTT/HL F/u cath plans, ability to transition back to PO  Daylene Posey, PharmD, [facility] Clinical Pharmacist ED Pharmacist Phone # [phone] 04/04/2023 7:25 PM

## 2023-04-04 NOTE — ED Provider Notes (Signed)
[place] EMERGENCY DEPARTMENT AT [facility] Provider Note  Arrival date/time:04/04/2023 6:42 PM  HPI/ROS   Colton Guzman is a 63 y.o. male with PMH significant for T2DM, HTN, gout, HLD, PAF on Eliquis, GERD, CAD s/p CABG who presents for chest pain and palpitations.  History is provided by patient and wife. While going up and down the stairs yesterday, patient started having intense substernal chest pain that he describes as 10 out of 10 and squeezing in nature.  He took home nitroglycerin which resolved the pain.  Ever since then, he has had a very mild pain right in the center of his chest, but it is not painful.  He is mainly concerned that the pain has not gone away on its own. He does have a significant cardiac history with multiple prior heart attacks and has had CABG and wanted to make sure he is not having another blockage in his heart. Patient describes this as a one-time event and is not sure if it was related to heartburn.  He does not describe increasing chest pain or shortness of breath with increasing activity.  Patient endorses that he most recently saw his cardiologist in December of last year.  They moved to [place] and he is looking for referral to a new cardiologist.  A complete ROS was performed with pertinent positives/negatives noted above.   ED Course and Medical Decision Making   I personally reviewed the patient's vitals.  Assessment/Plan: This is a 63 year old patient with significant cardiac history who is presenting to the emergency department with chest pain. EKG without ischemic changes.  Troponin negative x 2.   Chest x-ray without signs of infiltrate, edema, soft tissue injury or pneumothorax.  Considering atypical presentation of pulmonary embolism.  Patient is low risk by Well's criteria - Cannot apply PERC due to age, but with Well's score of 0, no clinical signs or symptoms of DVT, normal HR, no recent immobilization or surgery in past 4  weeks, no prior DVT/PE, no hemoptysis, and no recent active malignancy, and low clinical suspicion, will not pursue further.  History and physical exam not suggestive of aortic dissection. No typical symptoms of chest pain radiating through to the back or other parts of the body.  No severe hypertension.  Symmetric bilateral radial pulses.  No associated neurologic deficits.  No associated abdominal or lower extremity symptoms.  No marfanoid features or evidence of underlying connective tissue disorder. With no signs of mediastinal widening on chest X-ray, and otherwise low clinical suspicion, will not pursue diagnosis further.  No tenderness on abdominal palpation to suggest referred pain from intra-abdominal pathology  Given patient's concerning past medical history as well as concerning substernal crushing chest pain last night with resolution following nitroglycerin, I consulted cardiology, please see their consult note for full details. They recommend admission due to concern for unstable angina.  Disposition:  I discussed the case with hospitalist who graciously agreed to admit the patient to their service for continued care.   Clinical Impression:  1. Chest pain, unspecified type     Rx / DC Orders ED Discharge Orders     None       The plan for this patient was discussed with Dr. Anitra Lauth, who voiced agreement and who oversaw evaluation and treatment of this patient.   Clinical Complexity A medically appropriate history, review of systems, and physical exam was performed.  If decision rules were used in this patient's evaluation, they are listed below.   Click  here for ABCD2, HEART and other calculatorsREFRESH Note before signing   Patient's presentation is most consistent with acute presentation with potential threat to life or bodily function.  Medical Decision Making   Physical Exam and Medical History   Vitals:   04/04/23 1600 04/04/23 1645 04/04/23 1650 04/04/23  1745  BP: 114/82 113/72  113/71  Pulse: 85 83  84  Resp: 20 18  (!) 24  Temp:   98.4 F (36.9 C)   TempSrc:   Oral   SpO2: 97% 98%  98%  Weight:      Height:        Physical Exam Vitals and nursing note reviewed.  Constitutional:      General: He is not in acute distress.    Appearance: He is well-developed.  HENT:     Head: Normocephalic and atraumatic.  Eyes:     Conjunctiva/sclera: Conjunctivae normal.  Cardiovascular:     Rate and Rhythm: Normal rate and regular rhythm.     Heart sounds: No murmur heard. Pulmonary:     Effort: Pulmonary effort is normal. No respiratory distress.     Breath sounds: Normal breath sounds.  Abdominal:     Palpations: Abdomen is soft.     Tenderness: There is no abdominal tenderness.  Musculoskeletal:        General: No swelling.     Cervical back: Neck supple.  Skin:    General: Skin is warm and dry.     Capillary Refill: Capillary refill takes less than 2 seconds.  Neurological:     Mental Status: He is alert.  Psychiatric:        Mood and Affect: Mood normal.     Medical History: Allergies  Allergen Reactions   Atorvastatin Other (See Comments)    Significant elevation in LFTs while on statins   Crestor [Rosuvastatin] Other (See Comments)    Significant elevation in LFTs while on statins   Metformin And Related Other (See Comments)   Propofol Other (See Comments)    Encephalopathy   Past Medical History:  Diagnosis Date   Anemia    hx iron transfusion   Arthritis    left shoulder   CAD (coronary artery disease) of artery bypass graft 02/2020   Diabetes mellitus without complication (HCC)    type 2   Elevated blood uric acid level    GI bleed    History of blood transfusion 08/19/2020   Hyperlipidemia LDL goal <70    Hypertension    PAF (paroxysmal atrial fibrillation) (HCC)    Wears partial dentures    upper dent/lower partial    Past Surgical History:  Procedure Laterality Date   BIOPSY  08/17/2020    Procedure: BIOPSY;  Surgeon: Napoleon Form, MD;  Location: [facility] ENDOSCOPY;  Service: Endoscopy;;   COLONOSCOPY WITH PROPOFOL Left 02/27/2020   Procedure: COLONOSCOPY WITH PROPOFOL;  Surgeon: Kathi Der, MD;  Location: [facility] ENDOSCOPY;  Service: Gastroenterology;  Laterality: Left;   COLONOSCOPY WITH PROPOFOL N/A 08/13/2020   Procedure: COLONOSCOPY WITH PROPOFOL;  Surgeon: Iva Boop, MD;  Location: [facility] ENDOSCOPY;  Service: Endoscopy;  Laterality: N/A;   CORONARY ARTERY BYPASS GRAFT N/A 03/08/2020   Procedure: CORONARY ARTERY BYPASS GRAFTING (CABG) x 5 ON PUMP;  Surgeon: Linden Dolin, MD;  Location: [facility] OR;  Service: Open Heart Surgery;  Laterality: N/A;   ENDOVEIN HARVEST OF GREATER SAPHENOUS VEIN Right 03/08/2020   Procedure: ENDOVEIN HARVEST OF GREATER SAPHENOUS VEIN;  Surgeon: Linden Dolin,  MD;  Location: [facility] OR;  Service: Open Heart Surgery;  Laterality: Right;   ESOPHAGOGASTRODUODENOSCOPY (EGD) WITH PROPOFOL N/A 02/27/2020   Procedure: ESOPHAGOGASTRODUODENOSCOPY (EGD) WITH PROPOFOL;  Surgeon: Kathi Der, MD;  Location: [facility] ENDOSCOPY;  Service: Gastroenterology;  Laterality: N/A;   ESOPHAGOGASTRODUODENOSCOPY (EGD) WITH PROPOFOL N/A 08/17/2020   Procedure: ESOPHAGOGASTRODUODENOSCOPY (EGD) WITH PROPOFOL;  Surgeon: Napoleon Form, MD;  Location: [facility] ENDOSCOPY;  Service: Endoscopy;  Laterality: N/A;   INSERTION OF MESH Left 05/09/2021   Procedure: INSERTION OF MESH;  Surgeon: Axel Filler, MD;  Location: [facility] OR;  Service: General;  Laterality: Left;   LEFT HEART CATH AND CORONARY ANGIOGRAPHY N/A 03/04/2020   Procedure: LEFT HEART CATH AND CORONARY ANGIOGRAPHY;  Surgeon: [name], MD;  Location: [facility] INVASIVE CV LAB;  Service: Cardiovascular;  Laterality: N/A;   RADIAL ARTERY HARVEST Left 03/08/2020   Procedure: RADIAL ARTERY HARVEST;  Surgeon: Linden Dolin, MD;  Location: [facility] OR;  Service: Open Heart Surgery;  Laterality: Left;   TEE WITHOUT CARDIOVERSION N/A  03/08/2020   Procedure: TRANSESOPHAGEAL ECHOCARDIOGRAM (TEE);  Surgeon: Linden Dolin, MD;  Location: [facility] OR;  Service: Open Heart Surgery;  Laterality: N/A;   XI ROBOTIC ASSISTED INGUINAL HERNIA REPAIR WITH MESH Left 05/09/2021   Procedure: XI ROBOTIC ASSISTED LEFT INGUINAL HERNIA REPAIR WITH MESH;  Surgeon: Axel Filler, MD;  Location: [facility] OR;  Service: General;  Laterality: Left;   No family history on file.  Social History   Tobacco Use   Smoking status: Former    Current packs/day: 0.00    Types: Cigarettes    Quit date: 2012    Years since quitting: 13.1   Smokeless tobacco: Never  Vaping Use   Vaping status: Former   Quit date: 08/28/2010  Substance Use Topics   Alcohol use: Not Currently    Comment: stopped drinking 02/2020   Drug use: Yes    Types: Marijuana    Comment: over 30 yrs ago as of 05/03/21    Procedures   If procedures were preformed on this patient, they are listed below:  Procedures   -------- HPI and MDM generated using voice dictation software and may contain dictation errors. Please contact me for any clarification or with any questions.   Cephus Slater, MD Emergency Medicine PGY-2    Caron Presume, MD 04/04/23 Nobie Putnam    Gwyneth Sprout, MD 04/04/23 2251

## 2023-04-04 NOTE — Hospital Course (Signed)
Colton Guzman. is a 63 y.o. male with medical history significant for CAD s/p CABG (2022), PAF on Eliquis, Hx of VT and VF arrest, ischemic cardiomyopathy, Hx of embolic CVA, T2DM, HTN, HLD, history of GI bleed (diverticulosis and gastric ulcers) who is admitted with chest pain concerning for unstable angina.

## 2023-04-04 NOTE — H&P (Signed)
History and Physical    Colton Guzman. KGM:[id number] DOB: 1960/05/18 DOA: 04/04/2023  PCP: [name], FNP  Patient coming from: Home  I have personally briefly reviewed patient's old medical records in [facility]  Chief Complaint: Chest pain  HPI: Colton Guzman. is a 63 y.o. male with medical history significant for CAD s/p CABG (2022), PAF on Eliquis, Hx of VT and VF arrest, ischemic cardiomyopathy, Hx of embolic CVA, T2DM, HTN, HLD, history of GI bleed (diverticulosis and gastric ulcers) who presented to the ED for evaluation of chest pain.  Patient reports he was walking up and down the stairs at home last night for exercise when he developed severe 9-10/10 pressure-like nonradiating left-sided chest pain.  He took his isosorbide and aspirin which helped some but he had difficulty sleeping last night.  He could not lay down flat because of persistent chest pain and some shortness of breath.  This was improved when he elevated his torso.  He thought maybe symptoms were related to acid reflux/heartburn however he also notes that he has been having intermittent left-sided chest pain when performing other activities such as lifting heavy objects.  Patient states that he is still having some minimal chest pain though significantly improved compared to last night.  ED Course  Labs/Imaging on admission: I have personally reviewed following labs and imaging studies.  Initial vitals showed BP 146/88, pulse 94, RR 16, temp 98.5 F, SpO2 98% on room air.  Labs show sodium 135, potassium 3.8, bicarb 22, BUN 18, creatinine 1.33, serum glucose 224, LFTs within normal limits, WBC 11.3, hemoglobin 13.8, platelets 154,000.  Troponin 4 x 2.  2 view chest x-ray negative for focal consolidation, edema, effusion.  Cardiology were consulted and recommended starting IV heparin, medical admission, and plan for cardiac catheterization tomorrow.  The hospitalist service was  consulted to admit.  Review of Systems: All systems reviewed and are negative except as documented in history of present illness above.   Past Medical History:  Diagnosis Date   Anemia    hx iron transfusion   Arthritis    left shoulder   CAD (coronary artery disease) of artery bypass graft 02/2020   Diabetes mellitus without complication (HCC)    type 2   Elevated blood uric acid level    GI bleed    History of blood transfusion 08/19/2020   Hyperlipidemia LDL goal <70    Hypertension    PAF (paroxysmal atrial fibrillation) (HCC)    Wears partial dentures    upper dent/lower partial    Past Surgical History:  Procedure Laterality Date   BIOPSY  08/17/2020   Procedure: BIOPSY;  Surgeon: Napoleon Form, MD;  Location: [facility] ENDOSCOPY;  Service: Endoscopy;;   COLONOSCOPY WITH PROPOFOL Left 02/27/2020   Procedure: COLONOSCOPY WITH PROPOFOL;  Surgeon: Kathi Der, MD;  Location: [facility] ENDOSCOPY;  Service: Gastroenterology;  Laterality: Left;   COLONOSCOPY WITH PROPOFOL N/A 08/13/2020   Procedure: COLONOSCOPY WITH PROPOFOL;  Surgeon: Iva Boop, MD;  Location: [facility] ENDOSCOPY;  Service: Endoscopy;  Laterality: N/A;   CORONARY ARTERY BYPASS GRAFT N/A 03/08/2020   Procedure: CORONARY ARTERY BYPASS GRAFTING (CABG) x 5 ON PUMP;  Surgeon: Linden Dolin, MD;  Location: [facility] OR;  Service: Open Heart Surgery;  Laterality: N/A;   ENDOVEIN HARVEST OF GREATER SAPHENOUS VEIN Right 03/08/2020   Procedure: ENDOVEIN HARVEST OF GREATER SAPHENOUS VEIN;  Surgeon: Linden Dolin, MD;  Location: [facility] OR;  Service: Open Heart  Surgery;  Laterality: Right;   ESOPHAGOGASTRODUODENOSCOPY (EGD) WITH PROPOFOL N/A 02/27/2020   Procedure: ESOPHAGOGASTRODUODENOSCOPY (EGD) WITH PROPOFOL;  Surgeon: Kathi Der, MD;  Location: [facility] ENDOSCOPY;  Service: Gastroenterology;  Laterality: N/A;   ESOPHAGOGASTRODUODENOSCOPY (EGD) WITH PROPOFOL N/A 08/17/2020   Procedure: ESOPHAGOGASTRODUODENOSCOPY (EGD) WITH PROPOFOL;   Surgeon: Napoleon Form, MD;  Location: [facility] ENDOSCOPY;  Service: Endoscopy;  Laterality: N/A;   INSERTION OF MESH Left 05/09/2021   Procedure: INSERTION OF MESH;  Surgeon: Axel Filler, MD;  Location: [facility] OR;  Service: General;  Laterality: Left;   LEFT HEART CATH AND CORONARY ANGIOGRAPHY N/A 03/04/2020   Procedure: LEFT HEART CATH AND CORONARY ANGIOGRAPHY;  Surgeon: [name], MD;  Location: [facility] INVASIVE CV LAB;  Service: Cardiovascular;  Laterality: N/A;   RADIAL ARTERY HARVEST Left 03/08/2020   Procedure: RADIAL ARTERY HARVEST;  Surgeon: Linden Dolin, MD;  Location: [facility] OR;  Service: Open Heart Surgery;  Laterality: Left;   TEE WITHOUT CARDIOVERSION N/A 03/08/2020   Procedure: TRANSESOPHAGEAL ECHOCARDIOGRAM (TEE);  Surgeon: Linden Dolin, MD;  Location: [facility] OR;  Service: Open Heart Surgery;  Laterality: N/A;   XI ROBOTIC ASSISTED INGUINAL HERNIA REPAIR WITH MESH Left 05/09/2021   Procedure: XI ROBOTIC ASSISTED LEFT INGUINAL HERNIA REPAIR WITH MESH;  Surgeon: Axel Filler, MD;  Location[facility] OR;  Service: General;  Laterality: Left;    Social History:  reports that he quit smoking about 13 years ago. His smoking use included cigarettes. He has never used smokeless tobacco. He reports that he does not currently use alcohol. He reports current drug use. Drug: Marijuana.  Allergies  Allergen Reactions   Atorvastatin Other (See Comments)    Significant elevation in LFTs while on statins   Crestor [Rosuvastatin] Other (See Comments)    Significant elevation in LFTs while on statins   Metformin And Related Other (See Comments)   Propofol Other (See Comments)    Encephalopathy    No family history on file.   Prior to Admission medications   Medication Sig Start Date End Date Taking? Authorizing Provider  allopurinol (ZYLOPRIM) 100 MG tablet Take 1 tablet (100 mg total) by mouth daily. 11/06/22   Etta Grandchild, MD  apixaban (ELIQUIS) 5 MG TABS tablet Take 1 tablet  (5 mg total) by mouth 2 (two) times daily. 09/12/22   Meriam Sprague, MD  ASPIRIN LOW DOSE 81 MG EC tablet Take 81 mg by mouth every 6 (six) hours as needed for moderate pain. 03/13/21   [provider]  Blood Pressure Monitoring (BLOOD PRESSURE DIGITAL SOLN) KIT  10/09/22   [provider]  carvedilol (COREG) 12.5 MG tablet Take 1 tablet (12.5 mg total) by mouth 2 (two) times daily. 11/20/22   [name], MD  clopidogrel (PLAVIX) 75 MG tablet Take 1 tablet (75 mg total) by mouth daily. 01/05/22   Meriam Sprague, MD  Continuous Blood Gluc Receiver (FREESTYLE LIBRE 3 READER) DEVI 1 Act by Does not apply route daily. 05/16/22   Etta Grandchild, MD  Continuous Glucose Sensor (FREESTYLE LIBRE 3 SENSOR) MISC Place 1 sensor on the skin every 14 days. Use to check glucose continuously 05/16/22   Etta Grandchild, MD  empagliflozin (JARDIANCE) 10 MG TABS tablet Take 1 tablet (10 mg total) by mouth daily before breakfast. 04/04/23   [name], MD  Evolocumab (REPATHA SURECLICK) 140 MG/ML SOAJ Inject 140 mg into the skin every 14 (fourteen) days. 04/03/22   Meriam Sprague, MD  Evolocumab (REPATHA SURECLICK) 140 MG/ML SOAJ Inject 140 mg into the skin every 14 (fourteen) days. 01/24/23   [name], MD  ezetimibe (ZETIA) 10 MG tablet Take 1 tablet (10 mg total) by mouth daily. 01/24/23   [name], MD  Fe Bisgly-Vit C-Vit B12-FA (GENTLE IRON) 28-60-0.008-0.4 MG CAPS Take 1 tablet by mouth in the morning.    [provider]  icosapent Ethyl (VASCEPA) 1 g capsule Take 2 capsules (2 g total) by mouth 2 (two) times daily. 12/18/21   Meriam Sprague, MD  isosorbide mononitrate (IMDUR) 30 MG 24 hr tablet Take 1 tablet (30 mg total) by mouth daily. 01/24/23   [name], MD  Magnesium 300 MG CAPS Take 300 mg by mouth daily.    [provider]  Multiple Vitamin (MULTIVITAMIN WITH MINERALS) TABS tablet Take 1  tablet by mouth daily.    [provider]  pantoprazole (PROTONIX) 40 MG tablet Take 1 tablet (40 mg total) 2 (two) times daily for 30 days, THEN 1 tablet (40 mg total) daily. 09/25/22 01/10/23  Etta Grandchild, MD  sacubitril-valsartan (ENTRESTO) 24-26 MG Take 1 tablet by mouth 2 (two) times daily. 08/01/22   Meriam Sprague, MD  Semaglutide (RYBELSUS) 14 MG TABS Take 1 tablet (14 mg total) by mouth daily. 12/19/22   [name], FNP  spironolactone (ALDACTONE) 25 MG tablet Take 1/2 tablet (12.5 mg total) by mouth daily. 04/13/22   Meriam Sprague, MD  traMADol (ULTRAM-ER) 100 MG 24 hr tablet Take 1 tablet (100 mg total) by mouth daily as needed for pain. 07/31/22   Etta Grandchild, MD  vitamin B-12 (CYANOCOBALAMIN) 1000 MCG tablet Take 1,000 mcg by mouth in the morning.    [provider]    Physical Exam: Vitals:   04/04/23 1645 04/04/23 1650 04/04/23 1745 04/04/23 1919  BP: 113/72  113/71   Pulse: 83  84   Resp: 18  (!) 24   Temp:  98.4 F (36.9 C)    TempSrc:  Oral    SpO2: 98%  98% 98%  Weight:      Height:       Constitutional: Resting in bed, NAD, calm, comfortable Eyes: EOMI, lids and conjunctivae normal ENMT: Mucous membranes are moist. Posterior pharynx clear of any exudate or lesions.Normal dentition.  Neck: normal, supple, no masses. Respiratory: clear to auscultation bilaterally, no wheezing, no crackles. Normal respiratory effort. No accessory muscle use.  Cardiovascular: Regular rate and rhythm, no murmurs / rubs / gallops. No extremity edema. 2+ pedal pulses. Abdomen: no tenderness, no masses palpated. Musculoskeletal: no clubbing / cyanosis. No joint deformity upper and lower extremities. Good ROM, no contractures. Normal muscle tone.  Skin: no rashes, lesions, ulcers. No induration Neurologic: Sensation intact. Strength 5/5 in all 4.  Psychiatric: Normal judgment and insight. Alert and oriented x 3. Normal mood.   EKG: Personally reviewed.  Sinus rhythm, rate 91, low voltage, no significant ST-T wave changes.  Rate is faster when compared to prior.  Assessment/Plan Principal Problem:   Chest pain Active Problems:   CAD (coronary artery disease) of artery bypass graft   Paroxysmal atrial fibrillation (HCC)   Type 2 diabetes mellitus (HCC)   Hypertension associated with diabetes (HCC)   Hyperlipidemia associated with type 2 diabetes mellitus (HCC)   History of CVA (cerebrovascular accident)   Dereck Borbe Satoru Milich. is a 63 y.o. 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  Assessment and Plan: Chest pain CAD s/p CABG 2022 Ischemic cardiomyopathy: Patient presenting with chest pain concerning for unstable angina.  Troponin is negative x 2, EKG without significant ischemic changes. -Cardiology following, plan for [facility] tomorrow -Started on IV heparin, Eliquis on hold -Continue aspirin 81 mg daily -Continue Imdur 30 mg daily -Continue Coreg 12.5 mg BID -Holding Entresto, spironolactone, Jardiance for now -N.p.o. after midnight  Paroxysmal atrial fibrillation: Stable, in sinus rhythm with controlled rate.  Continue Coreg 12.5 mg BID.  Eliquis on hold while on IV heparin.  Type 2 diabetes: Holding Jardiance and Rybelsus.  Placed on SSI.  Hypertension: BP low normal.  Continue Coreg as BP allows.  Holding Entresto, spironolactone.  Hyperlipidemia: Continue Zetia.  Also on Repatha as an outpatient.  Not on statin due to elevated LFTs on statins in the past.  History of embolic CVA: Continue aspirin.  Holding Eliquis while on IV heparin.  History of GI bleed: History of GI bleed due to diverticulosis and gastric ulcers.  No obvious bleeding on admission.  He is on aspirin and Eliquis  as an outpatient.  He was taken off Plavix by his cardiologist in December.   DVT prophylaxis: Heparin drip Code Status: Full code, confirmed with patient on admission Family Communication: Spouse and daughter at bedside Disposition Plan: From home and likely discharge to home pending clinical progress Consults called: Cardiology Severity of Illness: The appropriate patient status for this patient is INPATIENT. Inpatient status is judged to be reasonable and necessary in order to provide the required intensity of service to ensure the patient's safety. The patient's presenting symptoms, physical exam findings, and initial radiographic and laboratory data in the context of their chronic comorbidities is felt to place them at high risk for further clinical deterioration. Furthermore, it is not anticipated that the patient will be medically stable for discharge from the hospital within 2 midnights of admission.   * I certify that at the point of admission it is my clinical judgment that the patient will require inpatient hospital care spanning beyond 2 midnights from the point of admission due to high intensity of service, high risk for further deterioration and high frequency of surveillance required.Darreld Mclean MD [facility]  If 7PM-7AM, please contact night-coverage [email or web address]  04/04/2023, 8:09 PM

## 2023-04-04 NOTE — ED Provider Triage Note (Signed)
Emergency Medicine Provider Triage Evaluation Note  [facility] [name]. , a 63 y.o. male  was evaluated in triage.  Pt complains of left-sided chest pain that has been intermittent since last night.  States pain is worse when lying flat.  He took some aspirin which seemed to help with his pain.  Reports some associated shortness of breath with this pain.  Denies any radiation of pain to his jaw, arm, or back.  Denies any dizziness or loss of consciousness.  He does have a significant cardiac history requiring CABGs in the past.  Review of Systems  Positive: As above Negative: As above  Physical Exam  BP (!) 146/88 (BP Location: Right Arm)   Pulse 94   Temp 98.5 F (36.9 C)   Resp 16   Ht 5\' 8"  (1.727 m)   Wt 83.5 kg   SpO2 98%   BMI 27.98 kg/m  Gen:   Awake, no distress   Resp:  Normal effort  MSK:   Moves extremities without difficulty    Medical Decision Making  Medically screening exam initiated at 2:10 PM.  Appropriate orders placed.  Colton Guzman Colton Guzman. was informed that the remainder of the evaluation will be completed by another provider, this initial triage assessment does not replace that evaluation, and the importance of remaining in the ED until their evaluation is complete.     Colton Merles, PA-C 04/04/23 1411

## 2023-04-04 NOTE — Consult Note (Signed)
Cardiology Consultation   Patient ID: Colton Guzman. MRN: [id number]; DOB: [birth year]  Admit date: 04/04/2023 Date of Consult: 04/04/2023  PCP:  [name], FNP   [facility] Providers Cardiologist:  Verne Carrow, MD  Electrophysiologist:  Lanier Prude, MD       Patient Profile:   Colton Guzman. is a 63 y.o. male with a hx of h history of CAD s/p 5V CABG (LIMA to LAD, SVG to PDA, SVG to PLA, left radial to intermediate branch and OM) in 2022, hx cardiac arrest secondary to Vfib arrest  is on Eliquis for the PAF.  To not be a good candidate for Watchman device due to his left atrial anatomy, Diabetes Mellitus, Hypertension, Hyperlipidemia, PAF on  who is here today for follow up.  who is being seen 04/04/2023 for the evaluation of worsening chest discomfort at the request of  the ED physician.  History of Present Illness:   Colton Guzman presents to be evaluated for worsening chest discomfort..  The patient his wife tells me this has been going on for a very long time and this about 2 to 3 weeks.  According to his wife recently when he comes to do some chores around the house especially lifting out things and going to the mailbox he gets significantly short of breath and complaint of chest discomfort.  She has been monitoring him but this is getting worse over time.  She notes that she back in 2022 he had similar symptoms and she is concerned.  Today he tells me while going up and down the stairs he started to have more intense pain the pain had always been about 6-7 out of 10.  But today it was about a 10 out of 10.  He said today he felt a different sensation where he was squeezing and then had a significant discomfort which more for pressure.  He took a nitroglycerin at home this resolved.  But he is very concerned that it came back and it was mild right-sided more like a pressure now.  He decided come to the ED to be evaluated.  In the ED troponin  was negative x 2, but during my assessment he continues to have chest discomfort.   Past Medical History:  Diagnosis Date   Anemia    hx iron transfusion   Arthritis    left shoulder   CAD (coronary artery disease) of artery bypass graft 02/2020   Diabetes mellitus without complication (HCC)    type 2   Elevated blood uric acid level    GI bleed    History of blood transfusion 08/19/2020   Hyperlipidemia LDL goal <70    Hypertension    PAF (paroxysmal atrial fibrillation) (HCC)    Wears partial dentures    upper dent/lower partial    Past Surgical History:  Procedure Laterality Date   BIOPSY  08/17/2020   Procedure: BIOPSY;  Surgeon: Napoleon Form, MD;  Location: [facility] ENDOSCOPY;  Service: Endoscopy;;   COLONOSCOPY WITH PROPOFOL Left 02/27/2020   Procedure: COLONOSCOPY WITH PROPOFOL;  Surgeon: Kathi Der, MD;  Location: [facility] ENDOSCOPY;  Service: Gastroenterology;  Laterality: Left;   COLONOSCOPY WITH PROPOFOL N/A 08/13/2020   Procedure: COLONOSCOPY WITH PROPOFOL;  Surgeon: Iva Boop, MD;  Location: [facility] ENDOSCOPY;  Service: Endoscopy;  Laterality: N/A;   CORONARY ARTERY BYPASS GRAFT N/A 03/08/2020   Procedure: CORONARY ARTERY BYPASS GRAFTING (CABG) x 5 ON PUMP;  Surgeon: Linden Dolin,  MD;  Location: [facility] OR;  Service: Open Heart Surgery;  Laterality: N/A;   ENDOVEIN HARVEST OF GREATER SAPHENOUS VEIN Right 03/08/2020   Procedure: ENDOVEIN HARVEST OF GREATER SAPHENOUS VEIN;  Surgeon: Linden Dolin, MD;  Location: [facility] OR;  Service: Open Heart Surgery;  Laterality: Right;   ESOPHAGOGASTRODUODENOSCOPY (EGD) WITH PROPOFOL N/A 02/27/2020   Procedure: ESOPHAGOGASTRODUODENOSCOPY (EGD) WITH PROPOFOL;  Surgeon: Kathi Der, MD;  Location: [facility] ENDOSCOPY;  Service: Gastroenterology;  Laterality: N/A;   ESOPHAGOGASTRODUODENOSCOPY (EGD) WITH PROPOFOL N/A 08/17/2020   Procedure: ESOPHAGOGASTRODUODENOSCOPY (EGD) WITH PROPOFOL;  Surgeon: Napoleon Form, MD;  Location: [facility]  ENDOSCOPY;  Service: Endoscopy;  Laterality: N/A;   INSERTION OF MESH Left 05/09/2021   Procedure: INSERTION OF MESH;  Surgeon: Axel Filler, MD;  Location: [facility] OR;  Service: General;  Laterality: Left;   LEFT HEART CATH AND CORONARY ANGIOGRAPHY N/A 03/04/2020   Procedure: LEFT HEART CATH AND CORONARY ANGIOGRAPHY;  Surgeon: [name], MD;  Location: [facility] INVASIVE CV LAB;  Service: Cardiovascular;  Laterality: N/A;   RADIAL ARTERY HARVEST Left 03/08/2020   Procedure: RADIAL ARTERY HARVEST;  Surgeon: Linden Dolin, MD;  Location: [facility] OR;  Service: Open Heart Surgery;  Laterality: Left;   TEE WITHOUT CARDIOVERSION N/A 03/08/2020   Procedure: TRANSESOPHAGEAL ECHOCARDIOGRAM (TEE);  Surgeon: Linden Dolin, MD;  Location[facility] OR;  Service: Open Heart Surgery;  Laterality: N/A;   XI ROBOTIC ASSISTED INGUINAL HERNIA REPAIR WITH MESH Left 05/09/2021   Procedure: XI ROBOTIC ASSISTED LEFT INGUINAL HERNIA REPAIR WITH MESH;  Surgeon: Axel Filler, MD;  Location: [facility] OR;  Service: General;  Laterality: Left;     Home Medications:  Prior to Admission medications   Medication Sig Start Date End Date Taking? Authorizing Provider  allopurinol (ZYLOPRIM) 100 MG tablet Take 1 tablet (100 mg total) by mouth daily. 11/06/22   Etta Grandchild, MD  apixaban (ELIQUIS) 5 MG TABS tablet Take 1 tablet (5 mg total) by mouth 2 (two) times daily. 09/12/22   Meriam Sprague, MD  ASPIRIN LOW DOSE 81 MG EC tablet Take 81 mg by mouth every 6 (six) hours as needed for moderate pain. 03/13/21   [provider]  Blood Pressure Monitoring (BLOOD PRESSURE DIGITAL SOLN) KIT  10/09/22   [provider]  carvedilol (COREG) 12.5 MG tablet Take 1 tablet (12.5 mg total) by mouth 2 (two) times daily. 11/20/22   [name], MD  clopidogrel (PLAVIX) 75 MG tablet Take 1 tablet (75 mg total) by mouth daily. 01/05/22   Meriam Sprague, MD  Continuous Blood Gluc Receiver (FREESTYLE LIBRE 3  READER) DEVI 1 Act by Does not apply route daily. 05/16/22   Etta Grandchild, MD  Continuous Glucose Sensor (FREESTYLE LIBRE 3 SENSOR) MISC Place 1 sensor on the skin every 14 days. Use to check glucose continuously 05/16/22   Etta Grandchild, MD  empagliflozin (JARDIANCE) 10 MG TABS tablet Take 1 tablet (10 mg total) by mouth daily before breakfast. 04/04/23   [name], MD  Evolocumab (REPATHA SURECLICK) 140 MG/ML SOAJ Inject 140 mg into the skin every 14 (fourteen) days. 04/03/22   Meriam Sprague, MD  Evolocumab (REPATHA SURECLICK) 140 MG/ML SOAJ Inject 140 mg into the skin every 14 (fourteen) days. 01/24/23   [name], MD  ezetimibe (ZETIA) 10 MG tablet Take 1 tablet (10 mg total) by mouth daily. 01/24/23   [name], MD  Fe Bisgly-Vit C-Vit B12-FA (GENTLE IRON) 28-60-0.008-0.4 MG CAPS  Take 1 tablet by mouth in the morning.    [provider]  icosapent Ethyl (VASCEPA) 1 g capsule Take 2 capsules (2 g total) by mouth 2 (two) times daily. 12/18/21   Meriam Sprague, MD  isosorbide mononitrate (IMDUR) 30 MG 24 hr tablet Take 1 tablet (30 mg total) by mouth daily. 01/24/23   [name], MD  Magnesium 300 MG CAPS Take 300 mg by mouth daily.    [provider]  Multiple Vitamin (MULTIVITAMIN WITH MINERALS) TABS tablet Take 1 tablet by mouth daily.    [provider]  pantoprazole (PROTONIX) 40 MG tablet Take 1 tablet (40 mg total) 2 (two) times daily for 30 days, THEN 1 tablet (40 mg total) daily. 09/25/22 01/10/23  Etta Grandchild, MD  sacubitril-valsartan (ENTRESTO) 24-26 MG Take 1 tablet by mouth 2 (two) times daily. 08/01/22   Meriam Sprague, MD  Semaglutide (RYBELSUS) 14 MG TABS Take 1 tablet (14 mg total) by mouth daily. 12/19/22   [name], FNP  spironolactone (ALDACTONE) 25 MG tablet Take 1/2 tablet (12.5 mg total) by mouth daily. 04/13/22   Meriam Sprague, MD  traMADol (ULTRAM-ER) 100 MG 24 hr  tablet Take 1 tablet (100 mg total) by mouth daily as needed for pain. 07/31/22   Etta Grandchild, MD  vitamin B-12 (CYANOCOBALAMIN) 1000 MCG tablet Take 1,000 mcg by mouth in the morning.    [provider]    Inpatient Medications: Scheduled Meds:  Continuous Infusions:  PRN Meds:   Allergies:    Allergies  Allergen Reactions   Atorvastatin Other (See Comments)    Significant elevation in LFTs while on statins   Crestor [Rosuvastatin] Other (See Comments)    Significant elevation in LFTs while on statins   Metformin And Related Other (See Comments)   Propofol Other (See Comments)    Encephalopathy    Social History:   Social History   Socioeconomic History   Marital status: Married    Spouse name: Not on file   Number of children: Not on file   Years of education: Not on file   Highest education level: Bachelor's degree (e.g., BA, AB, BS)  Occupational History   Not on file  Tobacco Use   Smoking status: Former    Current packs/day: 0.00    Types: Cigarettes    Quit date: 2012    Years since quitting: 13.1   Smokeless tobacco: Never  Vaping Use   Vaping status: Former   Quit date: 08/28/2010  Substance and Sexual Activity   Alcohol use: Not Currently    Comment: stopped drinking 02/2020   Drug use: Yes    Types: Marijuana    Comment: over 30 yrs ago as of 05/03/21   Sexual activity: Not Currently  Other Topics Concern   Not on file  Social History Narrative   Not on file   Social Drivers of Health   Financial Resource Strain: Medium Risk (12/13/2022)   Overall Financial Resource Strain (CARDIA)    Difficulty of Paying Living Expenses: Somewhat hard  Food Insecurity: Food Insecurity Present (12/13/2022)   Hunger Vital Sign    Worried About Running Out of Food in the Last Year: Sometimes true    Ran Out of Food in the Last Year: Patient declined  Transportation Needs: No Transportation Needs (12/13/2022)   PRAPARE - Scientist, research (physical sciences) (Medical): No    Lack of Transportation (Non-Medical): No  Physical Activity:  Insufficiently Active (05/11/2022)   Exercise Vital Sign    Days of Exercise per Week: 3 days    Minutes of Exercise per Session: 30 min  Stress: Stress Concern Present (05/11/2022)   [employer] of Occupational Health - Occupational Stress Questionnaire    Feeling of Stress : To some extent  Social Connections: Socially Integrated (12/13/2022)   Social Connection and Isolation Panel [NHANES]    Frequency of Communication with Friends and Family: Twice a week    Frequency of Social Gatherings with Friends and Family: Once a week    Attends Religious Services: More than 4 times per year    Active Member of [employer] or Organizations: Yes    Attends Banker Meetings: 1 to 4 times per year    Marital Status: Married  Catering manager Violence: Not on file    Family History:   No family history on file.   ROS:  Please see the history of present illness.  Chest pain, shortness of breath  All other ROS reviewed and negative.     Physical Exam/Data:   Vitals:   04/04/23 1600 04/04/23 1645 04/04/23 1650 04/04/23 1745  BP: 114/82 113/72  113/71  Pulse: 85 83  84  Resp: 20 18  (!) 24  Temp:   98.4 F (36.9 C)   TempSrc:   Oral   SpO2: 97% 98%  98%  Weight:      Height:       No intake or output data in the 24 hours ending 04/04/23 1806    04/04/2023    2:09 PM 01/24/2023    9:27 AM 12/17/2022    2:09 PM  Last 3 Weights  Weight (lbs) 184 lb 184 lb 9.6 oz 185 lb 6.4 oz  Weight (kg) 83.462 kg 83.734 kg 84.097 kg     Body mass index is 27.98 kg/m.  General:  Well nourished, well developed, in no acute distress HEENT: normal Neck: no JVD Vascular: No carotid bruits; Distal pulses 2+ bilaterally Cardiac:  normal S1, S2; RRR; no murmur  Lungs:  clear to auscultation bilaterally, no wheezing, rhonchi or rales  Abd: soft, nontender, no hepatomegaly  Ext: no  edema Musculoskeletal:  No deformities, BUE and BLE strength normal and equal Skin: warm and dry  Neuro:  CNs 2-12 intact, no focal abnormalities noted Psych:  Normal affect   EKG:  The EKG was personally reviewed and demonstrates:   Telemetry:  Telemetry was personally reviewed and demonstrates:    Relevant CV Studies: Reviewed echo and CCTA  Laboratory Data:  High Sensitivity Troponin:   Recent Labs  Lab 04/04/23 1413 04/04/23 1605  TROPONINIHS 4 4     Chemistry Recent Labs  Lab 04/04/23 1413  NA 135  K 3.8  CL 101  CO2 22  GLUCOSE 224*  BUN 18  CREATININE 1.33*  CALCIUM 9.4  GFRNONAA >60  ANIONGAP 12    Recent Labs  Lab 04/04/23 1413  PROT 7.5  ALBUMIN 3.7  AST 26  ALT 27  ALKPHOS 47  BILITOT 0.8   Lipids No results for input(s): "CHOL", "TRIG", "HDL", "LABVLDL", "LDLCALC", "CHOLHDL" in the last 168 hours.  Hematology Recent Labs  Lab 04/04/23 1413  WBC 11.3*  RBC 4.27  HGB 13.8  HCT 40.1  MCV 93.9  MCH 32.3  MCHC 34.4  RDW 13.1  PLT 154   Thyroid No results for input(s): "TSH", "FREET4" in the last 168 hours.  BNPNo results for input(s): "BNP", "  PROBNP" in the last 168 hours.  DDimer No results for input(s): "DDIMER" in the last 168 hours.   Radiology/Studies:  DG Chest 2 View Result Date: 04/04/2023 CLINICAL DATA:  Left-sided chest pain. EXAM: CHEST - 2 VIEW COMPARISON:  07/13/2022. FINDINGS: Bilateral lung fields are clear. Bilateral costophrenic angles are clear. Normal cardio-mediastinal silhouette. There are surgical staples along the heart border and sternotomy wires, status post CABG (coronary artery bypass graft). No acute osseous abnormalities. The soft tissues are within normal limits. IMPRESSION: No active cardiopulmonary disease. Electronically Signed   By: Jules Schick M.D.   On: 04/04/2023 16:51     Assessment and Plan:   Unstable angina Coronary artery disease status post CABG in  2022 Hypertension Hyperlipidemia Diabetes mellitus  His symptoms is concerning for unstable angina, he has significant risk for progression for coronary artery disease.  With this said it would be best for the patient to undergo an ischemic evaluation.  In the meantime given his chest pain we will start the patient on heparin drip, hold his Eliquis please, continue aspirin 81 mg daily, continue Plavix 75 mg daily  Will plan for left heart catheterization tomorrow morning I spoke with the patient and his wife they are both in agreement.  The patient understands that risks include but are not limited to stroke (1 in 1000), death (1 in 1000), kidney failure [usually temporary] (1 in 500), bleeding (1 in 200), allergic reaction [possibly serious] (1 in 200), and agrees to proceed.  In terms of hyperlipidemia he is on Repatha and Zetia and also is on Vascepa 2 g twice daily  Continue Imdur 30 mg daily.  Hold Entresto and spironolactone until post cath.  Blood pressure is on the lower side.  Will continue to monitor  Diabetes mellitus per the primary team.  Risk Assessment/Risk Scores:     TIMI Risk Score for Unstable Angina or Non-ST Elevation MI:   The patient's TIMI risk score is  , which indicates a  % risk of all cause mortality, new or recurrent myocardial infarction or need for urgent revascularization in the next 14 days.    CHA2DS2-VASc Score =     This indicates a  % annual risk of stroke. The patient's score is based upon:          For questions or updates, please contact [facility] Please consult [email or web address] for contact info under    SignedThomasene Ripple, DO  04/04/2023 6:06 PM

## 2023-04-04 NOTE — ED Triage Notes (Signed)
Pt. Stated, I started having chest pain last night like a sharp poke. I did take ASA and some SOB . I thought it was indigestion but it was not. When I laid flat it was SOB . I could feel my heart to beat. The pain does not go anywhere.

## 2023-04-04 NOTE — Addendum Note (Signed)
Addended by: Tylene Fantasia on: 04/04/2023 08:27 AM   Modules accepted: Orders

## 2023-04-05 ENCOUNTER — Inpatient Hospital Stay (HOSPITAL_COMMUNITY): Payer: Medicare Other

## 2023-04-05 ENCOUNTER — Encounter (HOSPITAL_COMMUNITY)
Admission: EM | Disposition: A | Discharge status: Home / Self Care | Payer: Self-pay | Source: Home / Self Care | Attending: Internal Medicine

## 2023-04-05 ENCOUNTER — Encounter (HOSPITAL_COMMUNITY): Payer: Self-pay | Admitting: Internal Medicine

## 2023-04-05 DIAGNOSIS — I2510 Atherosclerotic heart disease of native coronary artery without angina pectoris: Secondary | ICD-10-CM

## 2023-04-05 DIAGNOSIS — E1159 Type 2 diabetes mellitus with other circulatory complications: Secondary | ICD-10-CM

## 2023-04-05 DIAGNOSIS — Z8673 Personal history of transient ischemic attack (TIA), and cerebral infarction without residual deficits: Secondary | ICD-10-CM

## 2023-04-05 DIAGNOSIS — E1169 Type 2 diabetes mellitus with other specified complication: Secondary | ICD-10-CM | POA: Diagnosis not present

## 2023-04-05 DIAGNOSIS — I25110 Atherosclerotic heart disease of native coronary artery with unstable angina pectoris: Secondary | ICD-10-CM | POA: Diagnosis not present

## 2023-04-05 DIAGNOSIS — E1142 Type 2 diabetes mellitus with diabetic polyneuropathy: Secondary | ICD-10-CM

## 2023-04-05 DIAGNOSIS — R079 Chest pain, unspecified: Secondary | ICD-10-CM | POA: Diagnosis not present

## 2023-04-05 DIAGNOSIS — I25810 Atherosclerosis of coronary artery bypass graft(s) without angina pectoris: Secondary | ICD-10-CM

## 2023-04-05 DIAGNOSIS — I480 Paroxysmal atrial fibrillation: Secondary | ICD-10-CM | POA: Diagnosis not present

## 2023-04-05 DIAGNOSIS — I10 Essential (primary) hypertension: Secondary | ICD-10-CM

## 2023-04-05 DIAGNOSIS — E785 Hyperlipidemia, unspecified: Secondary | ICD-10-CM

## 2023-04-05 DIAGNOSIS — R0789 Other chest pain: Secondary | ICD-10-CM

## 2023-04-05 DIAGNOSIS — I152 Hypertension secondary to endocrine disorders: Secondary | ICD-10-CM

## 2023-04-05 DIAGNOSIS — E0800 Diabetes mellitus due to underlying condition with hyperosmolarity without nonketotic hyperglycemic-hyperosmolar coma (NKHHC): Secondary | ICD-10-CM

## 2023-04-05 DIAGNOSIS — E782 Mixed hyperlipidemia: Secondary | ICD-10-CM

## 2023-04-05 HISTORY — PX: LEFT HEART CATH AND CORS/GRAFTS ANGIOGRAPHY: CATH118250

## 2023-04-05 LAB — CBC
HCT: 38.4 % — ABNORMAL LOW (ref 39.0–52.0)
Hemoglobin: 13.1 g/dL (ref 13.0–17.0)
MCH: 32.7 pg (ref 26.0–34.0)
MCHC: 34.1 g/dL (ref 30.0–36.0)
MCV: 95.8 fL (ref 80.0–100.0)
Platelets: 139 10*3/uL — ABNORMAL LOW (ref 150–400)
RBC: 4.01 MIL/uL — ABNORMAL LOW (ref 4.22–5.81)
RDW: 13 % (ref 11.5–15.5)
WBC: 7.4 10*3/uL (ref 4.0–10.5)
nRBC: 0 % (ref 0.0–0.2)

## 2023-04-05 LAB — BASIC METABOLIC PANEL
Anion gap: 11 (ref 5–15)
BUN: 14 mg/dL (ref 8–23)
CO2: 23 mmol/L (ref 22–32)
Calcium: 9.2 mg/dL (ref 8.9–10.3)
Chloride: 101 mmol/L (ref 98–111)
Creatinine, Ser: 1.28 mg/dL — ABNORMAL HIGH (ref 0.61–1.24)
GFR, Estimated: >60 mL/min (ref >60)
Glucose, Bld: 211 mg/dL — ABNORMAL HIGH (ref 70–99)
Potassium: 4.1 mmol/L (ref 3.5–5.1)
Sodium: 135 mmol/L (ref 135–145)

## 2023-04-05 LAB — ECHOCARDIOGRAM COMPLETE BUBBLE STUDY
Area-P 1/2: 2.55 cm2
S' Lateral: 2.9 cm

## 2023-04-05 LAB — GLUCOSE, CAPILLARY
Glucose-Capillary: 143 mg/dL — ABNORMAL HIGH (ref 70–99)
Glucose-Capillary: 118 mg/dL — ABNORMAL HIGH (ref 70–99)
Glucose-Capillary: 275 mg/dL — ABNORMAL HIGH (ref 70–99)

## 2023-04-05 LAB — APTT
aPTT: 79 s — ABNORMAL HIGH (ref 24–36)
aPTT: 94 s — ABNORMAL HIGH (ref 24–36)

## 2023-04-05 LAB — CBG MONITORING, ED: Glucose-Capillary: 217 mg/dL — ABNORMAL HIGH (ref 70–99)

## 2023-04-05 LAB — HIV ANTIBODY (ROUTINE TESTING W REFLEX): HIV Screen 4th Generation wRfx: NONREACTIVE

## 2023-04-05 LAB — HEPARIN LEVEL (UNFRACTIONATED): Heparin Unfractionated: >1.1 [IU]/mL — ABNORMAL HIGH (ref 0.30–0.70)

## 2023-04-05 SURGERY — LEFT HEART CATH AND CORS/GRAFTS ANGIOGRAPHY
Anesthesia: LOCAL

## 2023-04-05 MED ORDER — HEPARIN (PORCINE) IN NACL 1000-0.9 UT/500ML-% IV SOLN
INTRAVENOUS | Status: DC | PRN
  Administered 2023-04-05 (×2): 500 mL

## 2023-04-05 MED ORDER — SODIUM CHLORIDE 0.9 % IV SOLN: INTRAVENOUS | Status: DC

## 2023-04-05 MED ORDER — HEPARIN SODIUM (PORCINE) 1000 UNIT/ML IJ SOLN
INTRAMUSCULAR | Status: AC
  Filled 2023-04-05: qty 10

## 2023-04-05 MED ORDER — LIDOCAINE HCL (PF) 1 % IJ SOLN
INTRAMUSCULAR | Status: AC
  Filled 2023-04-05: qty 30

## 2023-04-05 MED ORDER — SODIUM CHLORIDE 0.9 % IV SOLN: INTRAVENOUS | Status: AC

## 2023-04-05 MED ORDER — FENTANYL CITRATE (PF) 100 MCG/2ML IJ SOLN
INTRAMUSCULAR | Status: DC | PRN
  Administered 2023-04-05: 25 ug via INTRAVENOUS

## 2023-04-05 MED ORDER — MIDAZOLAM HCL 2 MG/2ML IJ SOLN
INTRAMUSCULAR | Status: AC
  Filled 2023-04-05: qty 2

## 2023-04-05 MED ORDER — FENTANYL CITRATE (PF) 100 MCG/2ML IJ SOLN
INTRAMUSCULAR | Status: AC
  Filled 2023-04-05: qty 2

## 2023-04-05 MED ORDER — LABETALOL HCL 5 MG/ML IV SOLN: 10.0000 mg | INTRAVENOUS | Status: AC | PRN

## 2023-04-05 MED ORDER — PERFLUTREN LIPID MICROSPHERE
1.0000 mL | INTRAVENOUS | Status: AC | PRN
  Administered 2023-04-05: 4 mL via INTRAVENOUS

## 2023-04-05 MED ORDER — SODIUM CHLORIDE 0.9% FLUSH: 3.0000 mL | INTRAVENOUS | Status: DC | PRN

## 2023-04-05 MED ORDER — LIDOCAINE HCL (PF) 1 % IJ SOLN
INTRAMUSCULAR | Status: DC | PRN
  Administered 2023-04-05: 5 mL

## 2023-04-05 MED ORDER — SODIUM CHLORIDE 0.9 % IV SOLN: 250.0000 mL | INTRAVENOUS | Status: DC | PRN

## 2023-04-05 MED ORDER — SODIUM CHLORIDE 0.9% FLUSH
3.0000 mL | Freq: Two times a day (BID) | INTRAVENOUS | Status: DC
  Administered 2023-04-05 – 2023-04-06 (×2): 3 mL via INTRAVENOUS

## 2023-04-05 MED ORDER — IOHEXOL 350 MG/ML SOLN
INTRAVENOUS | Status: DC | PRN
  Administered 2023-04-05: 105 mL

## 2023-04-05 MED ORDER — MIDAZOLAM HCL 2 MG/2ML IJ SOLN
INTRAMUSCULAR | Status: DC | PRN
  Administered 2023-04-05: 1 mg via INTRAVENOUS

## 2023-04-05 MED ORDER — VERAPAMIL HCL 2.5 MG/ML IV SOLN
INTRAVENOUS | Status: AC
  Filled 2023-04-05: qty 2

## 2023-04-05 MED ORDER — HYDRALAZINE HCL 20 MG/ML IJ SOLN: 10.0000 mg | INTRAMUSCULAR | Status: AC | PRN

## 2023-04-05 SURGICAL SUPPLY — 10 items
CATH INFINITI 5 FR RCB (CATHETERS) IMPLANT
CATH INFINITI 5FR AL1 (CATHETERS) IMPLANT
CATH INFINITI 5FR MULTPACK ANG (CATHETERS) IMPLANT
CLOSURE MYNX CONTROL 5F (Vascular Products) IMPLANT
KIT MICROPUNCTURE NIT STIFF (SHEATH) IMPLANT
PACK CARDIAC CATHETERIZATION (CUSTOM PROCEDURE TRAY) ×1 IMPLANT
SET ATX-X65L (MISCELLANEOUS) IMPLANT
SHEATH PINNACLE 5F 10CM (SHEATH) IMPLANT
SHEATH PINNACLE 6F 10CM (SHEATH) IMPLANT
WIRE EMERALD 3MM-J .035X150CM (WIRE) IMPLANT

## 2023-04-05 NOTE — Progress Notes (Signed)
Progress Note  Patient Name: Colton Guzman. Date of Encounter: 04/05/2023  Primary Cardiologist: Verne Carrow, MD   Subjective   Patient seen examined at his bedside.  Tells me that since starting a heparin drip his pain has come down to a 3 with 10 yesterday.  Inpatient Medications    Scheduled Meds:  aspirin EC  81 mg Oral Daily   carvedilol  12.5 mg Oral BID   ezetimibe  10 mg Oral Daily   insulin aspart  0-5 Units Subcutaneous QHS   insulin aspart  0-9 Units Subcutaneous TID WC   isosorbide mononitrate  30 mg Oral Daily   sodium chloride flush  3 mL Intravenous Q12H   Continuous Infusions:  heparin 1,100 Units/hr (04/05/23 0730)   PRN Meds: acetaminophen **OR** acetaminophen, ondansetron **OR** ondansetron (ZOFRAN) IV, senna-docusate   Vital Signs    Vitals:   04/05/23 0400 04/05/23 0500 04/05/23 0600 04/05/23 0724  BP: 115/80 115/83 103/77   Pulse: 72 83 72   Resp: 20 (!) 21 (!) 21   Temp:   98.2 F (36.8 C)   TempSrc:   Oral   SpO2: 97% 97% 95% 98%  Weight:      Height:       No intake or output data in the 24 hours ending 04/05/23 0819 Filed Weights   04/04/23 1409  Weight: 83.5 kg    Telemetry  Sinus rhythm- Personally Reviewed  ECG     - Personally Reviewed  Physical Exam    General: Comfortable Head: Atraumatic, normal size  Eyes: PEERLA, EOMI  Neck: Supple, normal JVD Cardiac: Normal S1, S2; RRR; no murmurs, rubs, or gallops Lungs: Clear to auscultation bilaterally Abd: Soft, nontender, no hepatomegaly  Ext: warm, no edema Musculoskeletal: No deformities, BUE and BLE strength normal and equal Skin: Warm and dry, no rashes   Neuro: Alert and oriented to person, place, time, and situation, CNII-XII grossly intact, no focal deficits  Psych: Normal mood and affect   Labs    Chemistry Recent Labs  Lab 04/04/23 1413 04/05/23 0501  NA 135 135  K 3.8 4.1  CL 101 101  CO2 22 23  GLUCOSE 224* 211*  BUN 18 14   CREATININE 1.33* 1.28*  CALCIUM 9.4 9.2  PROT 7.5  --   ALBUMIN 3.7  --   AST 26  --   ALT 27  --   ALKPHOS 47  --   BILITOT 0.8  --   GFRNONAA >60 >60  ANIONGAP 12 11     Hematology Recent Labs  Lab 04/04/23 1413 04/05/23 0501  WBC 11.3* 7.4  RBC 4.27 4.01*  HGB 13.8 13.1  HCT 40.1 38.4*  MCV 93.9 95.8  MCH 32.3 32.7  MCHC 34.4 34.1  RDW 13.1 13.0  PLT 154 139*    Cardiac EnzymesNo results for input(s): "TROPONINI" in the last 168 hours. No results for input(s): "TROPIPOC" in the last 168 hours.   BNPNo results for input(s): "BNP", "PROBNP" in the last 168 hours.   DDimer No results for input(s): "DDIMER" in the last 168 hours.   Radiology    DG Chest 2 View Result Date: 04/04/2023 CLINICAL DATA:  Left-sided chest pain. EXAM: CHEST - 2 VIEW COMPARISON:  07/13/2022. FINDINGS: Bilateral lung fields are clear. Bilateral costophrenic angles are clear. Normal cardio-mediastinal silhouette. There are surgical staples along the heart border and sternotomy wires, status post CABG (coronary artery bypass graft). No acute osseous abnormalities. The soft tissues  are within normal limits. IMPRESSION: No active cardiopulmonary disease. Electronically Signed   By: Jules Schick M.D.   On: 04/04/2023 16:51    Cardiac Studies  Prior echo   Patient Profile     63 y.o. male with history of coronary artery disease status post CABG, hypertension, hyperlipidemia diabetes admitted for unstable angina  Assessment & Plan    Unstable angina - improving on hep gtt Coronary artery disease status post CABG in 2022 Hypertension Diabetes History of V-fib Paroxysmal atrial fibrillation on Eliquis   He is plan for left heart catheterization today.  He is aware of this.  Thankfully the heparin has give him some relief.  Will also get echocardiogram to reassess his LV function.  In terms of hyperlipidemia he is on Repatha and Zetia and also is on Vascepa 2 g twice daily   Continue  Imdur 30 mg daily.  Hold Entresto and spironolactone until post cath.   Blood pressure is on the lower side.  Will continue to monitor   Diabetes mellitus per the primary team  Paroxysmal atrial fibrillation - he is on Eliquis but no heparin  AKI-creatinine improving 1.28 today.    For questions or updates, please contact [facility] Please consult [email or web address] for contact info under Cardiology/STEMI.      Signed, Thomasene Ripple, DO  04/05/2023, 8:19 AM

## 2023-04-05 NOTE — Progress Notes (Signed)
PHARMACY - ANTICOAGULATION CONSULT NOTE  Pharmacy Consult for heparin Indication: chest pain/ACS and atrial fibrillation  Labs: Recent Labs    04/04/23 1413 04/04/23 1605 04/05/23 0225  HGB 13.8  --   --   HCT 40.1  --   --   PLT 154  --   --   APTT  --   --  79*  HEPARINUNFRC  --   --  >1.10*  CREATININE 1.33*  --   --   TROPONINIHS 4 4  --    Assessment/Plan:  63yo male therapeutic on heparin with initial dosing while DOAC on hold. Will continue infusion at current rate of 1100 units/hr and confirm stable with additional PTT.  Vernard Gambles, PharmD, BCPS 04/05/2023 3:08 AM

## 2023-04-05 NOTE — Progress Notes (Signed)
Echocardiogram 2D Echocardiogram has been performed.  Warren Lacy Kacey Vicuna RDCS 04/05/2023, 10:04 AM

## 2023-04-05 NOTE — H&P (View-Only) (Signed)
Progress Note  Patient Name: Colton Guzman [facility]. Date of Encounter: 04/05/2023  Primary Cardiologist: Verne Carrow, MD   Subjective   Patient seen examined at his bedside.  Tells me that since starting a heparin drip his pain has come down to a 3 with 10 yesterday.  Inpatient Medications    Scheduled Meds:  aspirin EC  81 mg Oral Daily   carvedilol  12.5 mg Oral BID   ezetimibe  10 mg Oral Daily   insulin aspart  0-5 Units Subcutaneous QHS   insulin aspart  0-9 Units Subcutaneous TID WC   isosorbide mononitrate  30 mg Oral Daily   sodium chloride flush  3 mL Intravenous Q12H   Continuous Infusions:  heparin 1,100 Units/hr (04/05/23 0730)   PRN Meds: acetaminophen **OR** acetaminophen, ondansetron **OR** ondansetron (ZOFRAN) IV, senna-docusate   Vital Signs    Vitals:   04/05/23 0400 04/05/23 0500 04/05/23 0600 04/05/23 0724  BP: 115/80 115/83 103/77   Pulse: 72 83 72   Resp: 20 (!) 21 (!) 21   Temp:   98.2 F (36.8 C)   TempSrc:   Oral   SpO2: 97% 97% 95% 98%  Weight:      Height:       No intake or output data in the 24 hours ending 04/05/23 0819 Filed Weights   04/04/23 1409  Weight: 83.5 kg    Telemetry  Sinus rhythm- Personally Reviewed  ECG     - Personally Reviewed  Physical Exam    General: Comfortable Head: Atraumatic, normal size  Eyes: PEERLA, EOMI  Neck: Supple, normal JVD Cardiac: Normal S1, S2; RRR; no murmurs, rubs, or gallops Lungs: Clear to auscultation bilaterally Abd: Soft, nontender, no hepatomegaly  Ext: warm, no edema Musculoskeletal: No deformities, BUE and BLE strength normal and equal Skin: Warm and dry, no rashes   Neuro: Alert and oriented to person, place, time, and situation, CNII-XII grossly intact, no focal deficits  Psych: Normal mood and affect   Labs    Chemistry Recent Labs  Lab 04/04/23 1413 04/05/23 0501  NA 135 135  K 3.8 4.1  CL 101 101  CO2 22 23  GLUCOSE 224* 211*  BUN 18 14   CREATININE 1.33* 1.28*  CALCIUM 9.4 9.2  PROT 7.5  --   ALBUMIN 3.7  --   AST 26  --   ALT 27  --   ALKPHOS 47  --   BILITOT 0.8  --   GFRNONAA >60 >60  ANIONGAP 12 11     Hematology Recent Labs  Lab 04/04/23 1413 04/05/23 0501  WBC 11.3* 7.4  RBC 4.27 4.01*  HGB 13.8 13.1  HCT 40.1 38.4*  MCV 93.9 95.8  MCH 32.3 32.7  MCHC 34.4 34.1  RDW 13.1 13.0  PLT 154 139*    Cardiac EnzymesNo results for input(s): "TROPONINI" in the last 168 hours. No results for input(s): "TROPIPOC" in the last 168 hours.   BNPNo results for input(s): "BNP", "PROBNP" in the last 168 hours.   DDimer No results for input(s): "DDIMER" in the last 168 hours.   Radiology    DG Chest 2 View Result Date: 04/04/2023 CLINICAL DATA:  Left-sided chest pain. EXAM: CHEST - 2 VIEW COMPARISON:  07/13/2022. FINDINGS: Bilateral lung fields are clear. Bilateral costophrenic angles are clear. Normal cardio-mediastinal silhouette. There are surgical staples along the heart border and sternotomy wires, status post CABG (coronary artery bypass graft). No acute osseous abnormalities. The soft tissues  are within normal limits. IMPRESSION: No active cardiopulmonary disease. Electronically Signed   By: Jules Schick M.D.   On: 04/04/2023 16:51    Cardiac Studies  Prior echo   Patient Profile     63 y.o. male with history of coronary artery disease status post CABG, hypertension, hyperlipidemia diabetes admitted for unstable angina  Assessment & Plan    Unstable angina - improving on hep gtt Coronary artery disease status post CABG in 2022 Hypertension Diabetes History of V-fib Paroxysmal atrial fibrillation on Eliquis   He is plan for left heart catheterization today.  He is aware of this.  Thankfully the heparin has give him some relief.  Will also get echocardiogram to reassess his LV function.  In terms of hyperlipidemia he is on Repatha and Zetia and also is on Vascepa 2 g twice daily   Continue  Imdur 30 mg daily.  Hold Entresto and spironolactone until post cath.   Blood pressure is on the lower side.  Will continue to monitor   Diabetes mellitus per the primary team  Paroxysmal atrial fibrillation - he is on Eliquis but no heparin  AKI-creatinine improving 1.28 today.    For questions or updates, please contact [facility] Please consult [email or web address] for contact info under Cardiology/STEMI.      Signed, Thomasene Ripple, DO  04/05/2023, 8:19 AM

## 2023-04-05 NOTE — Progress Notes (Signed)
PHARMACY - ANTICOAGULATION CONSULT NOTE  Pharmacy Consult for heparin Indication: chest pain/ACS  Allergies  Allergen Reactions   Atorvastatin Other (See Comments)    Significant elevation in LFTs while on statins   Crestor [Rosuvastatin] Other (See Comments)    Significant elevation in LFTs while on statins   Metformin And Related Other (See Comments)   Propofol Other (See Comments)    Encephalopathy    Patient Measurements: Height: 5\' 8"  (172.7 cm) Weight: 83.5 kg (184 lb) IBW/kg (Calculated) : 68.4 Heparin Dosing Weight: TBW  Vital Signs: Temp: 97.9 F (36.6 C) (02/14 1017) Temp Source: Oral (02/14 1017) BP: 115/80 (02/14 1017) Pulse Rate: 71 (02/14 1017)  Labs: Recent Labs    04/04/23 1413 04/04/23 1605 04/05/23 0225 04/05/23 0501 04/05/23 1015  HGB 13.8  --   --  13.1  --   HCT 40.1  --   --  38.4*  --   PLT 154  --   --  139*  --   APTT  --   --  79*  --  94*  HEPARINUNFRC  --   --  >1.10*  --   --   CREATININE 1.33*  --   --  1.28*  --   TROPONINIHS 4 4  --   --   --     Estimated Creatinine Clearance: 63.8 mL/min (A) (by C-G formula based on SCr of 1.28 mg/dL (H)).   Medical History: Past Medical History:  Diagnosis Date   Anemia    hx iron transfusion   Arthritis    left shoulder   CAD (coronary artery disease) of artery bypass graft 02/2020   Diabetes mellitus without complication (HCC)    type 2   Elevated blood uric acid level    GI bleed    History of blood transfusion 08/19/2020   Hyperlipidemia LDL goal <70    Hypertension    PAF (paroxysmal atrial fibrillation) (HCC)    Wears partial dentures    upper dent/lower partial    Assessment: 48 YOM presenting with chest discomfort, hx of CAD, hx of afib on Eliquis PTA, last dose taken 2/13 @0900   Per cards - plan for [facility] later today.   aPTT therapeutic at 94. Hb 13.1, plt 139.   Goal of Therapy:  Heparin level 0.3-0.7 units/ml aPTT 66-102 seconds Monitor platelets by  anticoagulation protocol: Yes   Plan:  Continue Heparin gtt at 1100 units/hr Daily aPTT/HL F/u cath plans, ability to transition back to PO  Cedric Fishman, PharmD, BCPS, BCCCP Clinical Pharmacist

## 2023-04-05 NOTE — Plan of Care (Signed)

## 2023-04-05 NOTE — Interval H&P Note (Signed)
History and Physical Interval Note:  04/05/2023 3:08 PM  Colton Guzman.  has presented today for surgery, with the diagnosis of unstable angina.  The various methods of treatment have been discussed with the patient and family. After consideration of risks, benefits and other options for treatment, the patient has consented to  Procedure(s): LEFT HEART CATH AND CORS/GRAFTS ANGIOGRAPHY (N/A)  PERCUTANEOUS CORONARY INTERVENTION   as a surgical intervention.  The patient's history has been reviewed, patient examined, no change in status, stable for surgery.  I have reviewed the patient's chart and labs.  Questions were answered to the patient's satisfaction.    Cath Lab Visit (complete for each Cath Lab visit)  Clinical Evaluation Leading to the Procedure:   ACS: Yes.    Non-ACS:    Anginal Classification: CCS IV  Anti-ischemic medical therapy: Minimal Therapy (1 class of medications)  Non-Invasive Test Results: No non-invasive testing performed  Prior CABG: Previous CABG   Bryan Lemma

## 2023-04-05 NOTE — Progress Notes (Signed)
Progress Note   Patient: Colton Guzman. ZOX:[id number] DOB: August 14, 1960 DOA: 04/04/2023     1 DOS: the patient was seen and examined on 04/05/2023   Brief hospital course: [facility] Jaimin Krupka. is a 63 y.o. male with medical history significant for CAD s/p CABG (2022), PAF on Eliquis, Hx of VT and VF arrest, ischemic cardiomyopathy, Hx of embolic CVA, T2DM, HTN, HLD, history of GI bleed (diverticulosis and gastric ulcers) who is admitted with chest pain concerning for unstable angina.  Assessment and Plan: Chest pain CAD s/p CABG 2022 Ischemic cardiomyopathy: Troponin is negative x 2, EKG without significant ischemic changes. Cardiology plan to left heart cath today. Continue IV heparin drip per pharmacy protocol, monitor APTT Eliquis on hold Continue aspirin, Imdur, Coreg. Hold Entresto, spironolactone, Jardiance for now NPO for cath today.   Paroxysmal atrial fibrillation: Stable, in sinus rhythm with controlled rate.   Continue Coreg 12.5 mg BID.  Eliquis on hold while on IV heparin.  Type 2 diabetes mellitus: Continue accucheks, sliding scale. Hold oral hypoglycemics.   Hypertension: BP low normal.   Continue Coreg as BP allows.   Continue to hold Entresto, spironolactone.   Hyperlipidemia: Continue Zetia.  Also on Repatha as an outpatient.   Not on statin due to elevated LFTs on statins in the past.   History of embolic CVA: Continue aspirin.  Holding Eliquis while on IV heparin.   History of GI bleed: History of GI bleed due to diverticulosis and gastric ulcers.  No active bleeding at this time. He is on aspirin and Eliquis as an outpatient.   He was taken off Plavix by his cardiologist in December. Watch for active bleeding.      Out of bed to chair. Incentive spirometry. Nursing supportive care. Fall, aspiration precautions. DVT prophylaxis   Code Status: Full Code  Subjective: Patient is seen and examined today morning. He is lying  comfortably. Has been having on an doff chest discomfort for many weeks. Wife at bedside asked about procedure time.  Physical Exam: Vitals:   04/05/23 0724 04/05/23 1010 04/05/23 1017 04/05/23 1250  BP:  115/80 115/80 112/79  Pulse:  77 71 72  Resp:   (!) 22 18  Temp:   97.9 F (36.6 C) 98.3 F (36.8 C)  TempSrc:   Oral Oral  SpO2: 98%  97% 98%  Weight:    82.8 kg  Height:    5\' 8"  (1.727 m)    General - Middle aged Hispanic male, no apparent distress HEENT - PERRLA, EOMI, atraumatic head, non tender sinuses. Lung - Clear, basal rales, no rhonchi, wheezes. Heart - S1, S2 heard, no murmurs, rubs, no pedal edema. Abdomen - Soft, non tender, bowel sounds good Neuro - Alert, awake and oriented x 3, non focal exam. Skin - Warm and dry.  Data Reviewed:      Latest Ref Rng & Units 04/05/2023    5:01 AM 04/04/2023    2:13 PM 12/17/2022    2:49 PM  CBC  WBC 4.0 - 10.5 K/uL 7.4  11.3  9.4   Hemoglobin 13.0 - 17.0 g/dL 81.1  91.4  78.2   Hematocrit 39.0 - 52.0 % 38.4  40.1  41.5   Platelets 150 - 400 K/uL 139  154  158.0       Latest Ref Rng & Units 04/05/2023    5:01 AM 04/04/2023    2:13 PM 12/17/2022    2:49 PM  BMP  Glucose 70 -  99 mg/dL 161  096  045   BUN 8 - 23 mg/dL 14  18  21    Creatinine 0.61 - 1.24 mg/dL 4.09  8.11  9.14   Sodium 135 - 145 mmol/L 135  135  133   Potassium 3.5 - 5.1 mmol/L 4.1  3.8  4.3   Chloride 98 - 111 mmol/L 101  101  99   CO2 22 - 32 mmol/L 23  22  26    Calcium 8.9 - 10.3 mg/dL 9.2  9.4  9.4    ECHOCARDIOGRAM COMPLETE BUBBLE STUDY Result Date: 04/05/2023    ECHOCARDIOGRAM REPORT   Patient Name:   [facility] Sara Guzman. Date of Exam: 04/05/2023 Medical Rec #:  [id number]                Height:       68.0 in Accession #:    [id number]               Weight:       184.0 lb Date of Birth:  10/04/1960                 BSA:          1.973 m Patient Age:    63 years                 BP:           127/83 mmHg Patient Gender: M                        HR:            68 bpm. Exam Location:  Inpatient Procedure: 2D Echo, Color Doppler, Cardiac Doppler, Saline Contrast Bubble Study            and Intracardiac Opacification Agent (Both Spectral and Color Flow            Doppler were utilized during procedure). Indications:    R07.9* Chest pain, unspecified  History:        Patient has prior history of Echocardiogram examinations, most                 recent 06/25/2022. CAD, Prior CABG, Arrythmias:Atrial                 Fibrillation; Risk Factors:Hypertension, Diabetes and                 Dyslipidemia.  Sonographer:    Irving Burton Senior RDCS Referring Phys: [id number] KARDIE TOBB IMPRESSIONS  1. Left ventricular ejection fraction, by estimation, is 50 to 55%. Left ventricular ejection fraction by PLAX is 59 %. The left ventricle has low normal function. The left ventricle demonstrates regional wall motion abnormalities (see scoring diagram/findings for description). Left ventricular diastolic parameters are consistent with Grade I diastolic dysfunction (impaired relaxation).  2. Right ventricular systolic function is low normal. The right ventricular size is normal. There is normal pulmonary artery systolic pressure. The estimated right ventricular systolic pressure is 12.9 mmHg.  3. The mitral valve is normal in structure. Trivial mitral valve regurgitation. No evidence of mitral stenosis.  4. The aortic valve is tricuspid. There is mild calcification of the aortic valve. Aortic valve regurgitation is trivial. Aortic valve sclerosis is present, with no evidence of aortic valve stenosis.  5. The inferior vena cava is normal in size with greater than 50% respiratory variability, suggesting right atrial pressure of 3 mmHg.  6. Agitated saline contrast  bubble study was negative, with no evidence of any interatrial shunt. FINDINGS  Left Ventricle: Left ventricular ejection fraction, by estimation, is 50 to 55%. Left ventricular ejection fraction by PLAX is 59 %. The left ventricle has low  normal function. The left ventricle demonstrates regional wall motion abnormalities. Definity  contrast agent was given IV to delineate the left ventricular endocardial borders. Strain imaging was not performed. The left ventricular internal cavity size was normal in size. There is no left ventricular hypertrophy. Left ventricular diastolic parameters are consistent with Grade I diastolic dysfunction (impaired relaxation). Indeterminate filling pressures.  LV Wall Scoring: The mid anteroseptal segment is akinetic. The basal anteroseptal segment, apical septal segment, and apex are hypokinetic. The entire anterior wall, entire lateral wall, entire inferior wall, mid inferoseptal segment, and basal inferoseptal segment are normal. Right Ventricle: The right ventricular size is normal. No increase in right ventricular wall thickness. Right ventricular systolic function is low normal. There is normal pulmonary artery systolic pressure. The tricuspid regurgitant velocity is 1.57 m/s,  and with an assumed right atrial pressure of 3 mmHg, the estimated right ventricular systolic pressure is 12.9 mmHg. Left Atrium: Left atrial size was normal in size. Right Atrium: Right atrial size was normal in size. Pericardium: There is no evidence of pericardial effusion. Mitral Valve: The mitral valve is normal in structure. Trivial mitral valve regurgitation. No evidence of mitral valve stenosis. Tricuspid Valve: The tricuspid valve is normal in structure. Tricuspid valve regurgitation is trivial. No evidence of tricuspid stenosis. Aortic Valve: The aortic valve is tricuspid. There is mild calcification of the aortic valve. Aortic valve regurgitation is trivial. Aortic valve sclerosis is present, with no evidence of aortic valve stenosis. Pulmonic Valve: The pulmonic valve was normal in structure. Pulmonic valve regurgitation is trivial. No evidence of pulmonic stenosis. Aorta: The ascending aorta was not well visualized and the  aortic root is normal in size and structure. Venous: The inferior vena cava is normal in size with greater than 50% respiratory variability, suggesting right atrial pressure of 3 mmHg. IAS/Shunts: No atrial level shunt detected by color flow Doppler. Agitated saline contrast was given intravenously to evaluate for intracardiac shunting. Agitated saline contrast bubble study was negative, with no evidence of any interatrial shunt. Additional Comments: 3D imaging was not performed.  LEFT VENTRICLE PLAX 2D LV EF:         Left            Diastology                ventricular     LV e' medial:    5.91 cm/s                ejection        LV E/e' medial:  9.5                fraction by     LV e' lateral:   9.95 cm/s                PLAX is 59      LV E/e' lateral: 5.7                %. LVIDd:         4.20 cm LVIDs:         2.90 cm LV PW:         0.80 cm LV IVS:        0.70 cm LVOT diam:  2.10 cm LV SV:         59 LV SV Index:   30 LVOT Area:     3.46 cm  RIGHT VENTRICLE RV S prime:     9.17 cm/s TAPSE (M-mode): 1.0 cm LEFT ATRIUM             Index        RIGHT ATRIUM          Index LA diam:        3.10 cm 1.57 cm/m   RA Area:     9.47 cm LA Vol (A2C):   56.4 ml 28.59 ml/m  RA Volume:   14.50 ml 7.35 ml/m LA Vol (A4C):   30.0 ml 15.21 ml/m LA Biplane Vol: 45.1 ml 22.86 ml/m  AORTIC VALVE LVOT Vmax:   87.90 cm/s LVOT Vmean:  58.500 cm/s LVOT VTI:    0.170 m  AORTA Ao Root diam: 2.90 cm MITRAL VALVE               TRICUSPID VALVE MV Area (PHT): 2.55 cm    TR Peak grad:   9.9 mmHg MV Decel Time: 298 msec    TR Vmax:        157.00 cm/s MV E velocity: 56.30 cm/s MV A velocity: 78.50 cm/s  SHUNTS MV E/A ratio:  0.72        Systemic VTI:  0.17 m                            Systemic Diam: 2.10 cm Chilton Si MD Electronically signed by Chilton Si MD Signature Date/Time: 04/05/2023/12:57:56 PM    Final    DG Chest 2 View Result Date: 04/04/2023 CLINICAL DATA:  Left-sided chest pain. EXAM: CHEST - 2 VIEW  COMPARISON:  07/13/2022. FINDINGS: Bilateral lung fields are clear. Bilateral costophrenic angles are clear. Normal cardio-mediastinal silhouette. There are surgical staples along the heart border and sternotomy wires, status post CABG (coronary artery bypass graft). No acute osseous abnormalities. The soft tissues are within normal limits. IMPRESSION: No active cardiopulmonary disease. Electronically Signed   By: Jules Schick M.D.   On: 04/04/2023 16:51     Family Communication: Discussed with patient, wife at bedside, they understand and agree. All questions answereed.    Disposition: Status is: Inpatient Remains inpatient appropriate because: IV heparin drip, cardiology work up.  Planned Discharge Destination: Home     Time spent: 39 minutes  Author: [name], MD 04/05/2023 1:09 PM Secure chat 7am to 7pm For on call review [email or web address].

## 2023-04-06 ENCOUNTER — Other Ambulatory Visit (HOSPITAL_COMMUNITY): Payer: Self-pay

## 2023-04-06 DIAGNOSIS — R0789 Other chest pain: Secondary | ICD-10-CM | POA: Diagnosis not present

## 2023-04-06 DIAGNOSIS — I25702 Atherosclerosis of coronary artery bypass graft(s), unspecified, with refractory angina pectoris: Secondary | ICD-10-CM | POA: Diagnosis not present

## 2023-04-06 DIAGNOSIS — Z8673 Personal history of transient ischemic attack (TIA), and cerebral infarction without residual deficits: Secondary | ICD-10-CM | POA: Diagnosis not present

## 2023-04-06 DIAGNOSIS — I480 Paroxysmal atrial fibrillation: Secondary | ICD-10-CM | POA: Diagnosis not present

## 2023-04-06 LAB — CBC
HCT: 37.4 % — ABNORMAL LOW (ref 39.0–52.0)
Hemoglobin: 13 g/dL (ref 13.0–17.0)
MCH: 32.7 pg (ref 26.0–34.0)
MCHC: 34.8 g/dL (ref 30.0–36.0)
MCV: 94.2 fL (ref 80.0–100.0)
Platelets: 135 10*3/uL — ABNORMAL LOW (ref 150–400)
RBC: 3.97 MIL/uL — ABNORMAL LOW (ref 4.22–5.81)
RDW: 13 % (ref 11.5–15.5)
WBC: 6.1 10*3/uL (ref 4.0–10.5)
nRBC: 0 % (ref 0.0–0.2)

## 2023-04-06 LAB — GLUCOSE, CAPILLARY: Glucose-Capillary: 251 mg/dL — ABNORMAL HIGH (ref 70–99)

## 2023-04-06 LAB — BASIC METABOLIC PANEL
Anion gap: 10 (ref 5–15)
BUN: 18 mg/dL (ref 8–23)
CO2: 22 mmol/L (ref 22–32)
Calcium: 8.9 mg/dL (ref 8.9–10.3)
Chloride: 102 mmol/L (ref 98–111)
Creatinine, Ser: 1.3 mg/dL — ABNORMAL HIGH (ref 0.61–1.24)
GFR, Estimated: >60 mL/min (ref >60)
Glucose, Bld: 224 mg/dL — ABNORMAL HIGH (ref 70–99)
Potassium: 3.7 mmol/L (ref 3.5–5.1)
Sodium: 134 mmol/L — ABNORMAL LOW (ref 135–145)

## 2023-04-06 MED ORDER — FREESTYLE LIBRE 3 READER DEVI
1.0000 | Freq: Every day | 3 refills | Status: AC
  Filled 2023-04-06: qty 1, 30d supply, fill #0
  Filled 2023-04-06: qty 1, fill #0

## 2023-04-06 MED ORDER — TRAMADOL HCL ER 100 MG PO TB24
100.0000 mg | ORAL_TABLET | Freq: Every day | ORAL | 0 refills | Status: DC | PRN
Start: 2023-04-06 — End: 2023-04-06
  Filled 2023-04-06: qty 10, 10d supply, fill #0

## 2023-04-06 MED ORDER — FREESTYLE LIBRE 3 SENSOR MISC
1.0000 | Freq: Every day | 5 refills | Status: DC
  Filled 2023-04-06: qty 2, fill #0
  Filled 2023-04-06: qty 2, 28d supply, fill #0

## 2023-04-06 MED ORDER — TRAMADOL HCL 50 MG PO TABS
100.0000 mg | ORAL_TABLET | Freq: Every day | ORAL | 0 refills | Status: AC | PRN
Start: 2023-04-06 — End: 2023-04-16
  Filled 2023-04-06: qty 14, 7d supply, fill #0

## 2023-04-06 MED ORDER — NITROGLYCERIN 0.3 MG SL SUBL
0.3000 mg | SUBLINGUAL_TABLET | SUBLINGUAL | 3 refills | Status: DC | PRN
  Filled 2023-04-06: qty 100, 1d supply, fill #0

## 2023-04-06 MED ORDER — NITROGLYCERIN 0.4 MG SL SUBL
0.4000 mg | SUBLINGUAL_TABLET | SUBLINGUAL | 3 refills | Status: AC | PRN
Start: 2023-04-06 — End: 2024-04-05
  Filled 2023-04-06: qty 25, 30d supply, fill #0

## 2023-04-06 NOTE — Plan of Care (Signed)
  Problem: Clinical Measurements: Goal: Ability to maintain clinical measurements within normal limits will improve Outcome: Completed/Met Goal: Will remain free from infection Outcome: Adequate for Discharge Goal: Diagnostic test results will improve Outcome: Adequate for Discharge Goal: Respiratory complications will improve Outcome: Adequate for Discharge Goal: Cardiovascular complication will be avoided Outcome: Adequate for Discharge   Problem: Activity: Goal: Risk for activity intolerance will decrease Outcome: Adequate for Discharge

## 2023-04-06 NOTE — Discharge Summary (Signed)
Physician Discharge Summary   Patient: Colton Guzman [facility]. MRN: [id number] DOB: [birth year]  Admit date:     04/04/2023  Discharge date: 04/06/23  Discharge Physician: [name]   PCP: [name], FNP   Recommendations at discharge:  {Tip this will not be part of the note when signed- Example include specific recommendations for outpatient follow-up, pending tests to follow-up on. (Optional):26781} PCP follow up in 1 week. Cardiology follow up as scheduled.  Discharge Diagnoses: Principal Problem:   Chest pain Active Problems:   CAD (coronary artery disease) of artery bypass graft   Paroxysmal atrial fibrillation (HCC)   Type 2 diabetes mellitus (HCC)   Hypertension associated with diabetes (HCC)   Hyperlipidemia associated with type 2 diabetes mellitus (HCC)   History of CVA (cerebrovascular accident)  Resolved Problems:   * No resolved hospital problems. *  Hospital Course: Colton Guzman Pleasant Bensinger. is a 63 y.o. male with medical history significant for CAD s/p CABG (2022), PAF on Eliquis, Hx of VT and VF arrest, ischemic cardiomyopathy, Hx of embolic CVA, T2DM, HTN, HLD, history of GI bleed (diverticulosis and gastric ulcers) who is admitted with chest pain concerning for unstable angina.  Assessment and Plan: ***    {Tip this will not be part of the note when signed Body mass index is 27.79 kg/m. , ,  (Optional):26781}  {(NOTE) Pain control PDMP Statment (Optional):26782} Consultants: Cardiology Procedures performed: Left heart cath  Disposition: Home Diet recommendation:  Discharge Diet Orders (From admission, onward)     Start     Ordered   04/06/23 0000  Diet - low sodium heart healthy        04/06/23 1059   04/06/23 0000  Diet Carb Modified        04/06/23 1059           Cardiac and Carb modified diet DISCHARGE MEDICATION: Allergies as of 04/06/2023       Reactions   Atorvastatin Other (See Comments)   Significant elevation in LFTs  while on statins   Crestor [rosuvastatin] Other (See Comments)   Significant elevation in LFTs while on statins   Metformin And Related Other (See Comments)   Propofol Other (See Comments)   Encephalopathy        Medication List     STOP taking these medications    icosapent Ethyl 1 g capsule Commonly known as: Vascepa       TAKE these medications    allopurinol 100 MG tablet Commonly known as: ZYLOPRIM Take 1 tablet (100 mg total) by mouth daily.   Aspirin Low Dose 81 MG tablet Generic drug: aspirin EC Take 81 mg by mouth every 6 (six) hours as needed for moderate pain.   Blood Pressure Digital Soln Kit   carvedilol 12.5 MG tablet Commonly known as: COREG Take 1 tablet (12.5 mg total) by mouth 2 (two) times daily.   cyanocobalamin 1000 MCG tablet Commonly known as: VITAMIN B12 Take 1,000 mcg by mouth in the morning.   Eliquis 5 MG Tabs tablet Generic drug: apixaban Take 1 tablet (5 mg total) by mouth 2 (two) times daily.   empagliflozin 10 MG Tabs tablet Commonly known as: Jardiance Take 1 tablet (10 mg total) by mouth daily before breakfast.   Entresto 24-26 MG Generic drug: sacubitril-valsartan Take 1 tablet by mouth 2 (two) times daily.   ezetimibe 10 MG tablet Commonly known as: ZETIA Take 1 tablet (10 mg total) by mouth daily.   FreeStyle Arcadia 3  Reader Hardie Pulley Use to monitor blood sugar as directed   FreeStyle Libre 3 Sensor Misc Place 1 sensor on the skin every 14 days. Use to check glucose continuously   Gentle Iron 28-60-0.008-0.4 MG Caps Generic drug: Fe Bisgly-Vit C-Vit B12-FA Take 1 tablet by mouth in the morning.   isosorbide mononitrate 30 MG 24 hr tablet Commonly known as: IMDUR Take 1 tablet (30 mg total) by mouth daily.   Magnesium 300 MG Caps Take 300 mg by mouth daily.   multivitamin with minerals Tabs tablet Take 1 tablet by mouth daily.   nitroGLYCERIN 0.3 MG SL tablet Commonly known as: Nitrostat Place 1 tablet (0.3 mg  total) under the tongue every 5 (five) minutes as needed for chest pain (not more than 3 doses).   Repatha SureClick 140 MG/ML Soaj Generic drug: Evolocumab Inject 140 mg into the skin every 14 (fourteen) days. What changed: Another medication with the same name was removed. Continue taking this medication, and follow the directions you see here.   Rybelsus 14 MG Tabs Generic drug: Semaglutide Take 1 tablet (14 mg total) by mouth daily.   spironolactone 25 MG tablet Commonly known as: ALDACTONE Take 1/2 tablet (12.5 mg total) by mouth daily.   traMADol 100 MG 24 hr tablet Commonly known as: ULTRAM-ER Take 1 tablet (100 mg total) by mouth daily as needed for up to 10 days for pain.        Follow-up Information     Yates Decamp, MD.   Specialty: Cardiology Contact information: [address] [place] [address] [phone]         Elder Negus, MD.   Specialties: Cardiology, Radiology Contact information: [address] [place] [address] [phone]                Discharge Exam: Colton Guzman Weights   04/04/23 1409 04/05/23 1250 04/06/23 0424  Weight: 83.5 kg 82.8 kg 82.9 kg      04/06/2023   11:04 AM 04/06/2023    4:24 AM 04/06/2023   12:27 AM  Vitals with BMI  Weight  182 lbs 13 oz   BMI  27.8   Systolic 108 117 244  Diastolic 92 72 71  Pulse  77 78   General - Middle aged Hispanic male, no apparent distress HEENT - PERRLA, EOMI, atraumatic head, non tender sinuses. Lung - Clear, basal rales, no rhonchi, wheezes. Heart - S1, S2 heard, no murmurs, rubs, no pedal edema. Abdomen - Soft, non tender, bowel sounds good Neuro - Alert, awake and oriented x 3, non focal exam. Skin - Warm and dry.  Condition at discharge: stable  The results of significant diagnostics from this hospitalization (including imaging, microbiology, ancillary and laboratory) are listed below for reference.   Imaging  Studies: CARDIAC CATHETERIZATION Result Date: 04/05/2023   NATIVE CORONARIES   Mid LM to Ost LAD lesion is 40% stenosed.   Prox LAD to Mid LAD lesion is 70% stenosed. Mid LAD to Dist LAD lesion is 100% stenosed. CTO   1st Diag-previously placed stent is 5% stenosed.  1st Diag-2 lesion is 80% stenosed with 50% stenosed side branch in Lat 1st Diag.  1st Diag-3 lesion is 100% stenosed after sidebranch. CTO   Ost Cx to Dist Cx lesion is 100% stenosed. CTO   Prox RCA lesion is 25% stenosed. Dist RCA lesion is 50% stenosed. Notable improvement from Pre-CABG Cath.  mid-distal RPDA lesion is 100% stenosed.   Small  caliber RPAV is diffusely diseased, 2nd RPL lesion is 99% stenosed, and 3rd RPL lesion is 99% stenosed.   GRAFTS   LIMA-LAD  graft was visualized by angiography and is normal in caliber. The graft exhibits no disease. The flow is not reversed. There is no competitive flow   Sequential Left Radial graft-2nd Mrg-3rd Mrgwas visualized by angiography and is large. The graft exhibits no disease. The flow is not reversed. There is no competitive flow. At distal anastomosis,  3rd Mrg lesion is 80% stenosed with 75% stenosed side branch in Lat 3rd Mrg.   SVG-RPDA graft was visualized by angiography and is small. The graft exhibits no disease, just tapers from her larger caliber ostium to a small caliber vessel all the way through to the anastomosis.. The flow is not reversed. There is no competitive flow   CABG report indicates another SVG-RPL, however this was not found and the PL branches are very small   HEMODYNAMICS   There is no aortic valve stenosis.   In the absence of any other complications or medical issues, we expect the patient to be ready for discharge from a cath perspective on 04/06/2023.   Recommend to resume Apixaban, at currently prescribed dose and frequency on 04/06/2023.   Recommend concurrent antiplatelet therapy of Aspirin 81 mg daily. FINDINGS SUMMARY Severe native vessel disease of the left  coronary artery: 100% LCx and LAD with patent D1 stent however the downstream D1 is very small in caliber -> but with no clear or obvious lesion distally. Native RCA is diffusely ectatic with moderate diffuse irregularities but no focal lesions. There is diffuse calcification. The PDA is a relatively large caliber vessel that appears to be occluded in the midportion and the distal portion is filled via a very small caliber SVG to PDA that is widely patent. The right posterior groove AV branch is relatively small in caliber and gives off 1 tiny and 2 small caliber posterolateral branches. (The CABG report indicated a vein graft to a PL branch however I was unable to find a graft, and it was not mentioned in his catheterization at [facility]-the target would be very small and therefore it is highly likely that it was flush occluded. Unfortunately with no graft markers.) Sequential Left Radial-OM2-OM3 is widely patent, however OM 3 itself is small caliber with diffuse disease proximally. There is retrograde filling up the marginal to the small posterolateral branches. Normal LVEDP RECOMMENDATIONS Potential culprits are the now occluded downstream 1st Diag (however this is not an acute finding based on negative troponins) as well as the downstream 3rd Mrg lesions (which are too small to intervene, especially on through a sequential graft). Best option is medical management. In the absence of any other complications or medical issues, we expect the patient to be ready for discharge from a cath perspective on 04/06/2023. Recommend to resume Apixaban, at currently prescribed dose and frequency on 04/06/2023. Recommend concurrent antiplatelet therapy of Aspirin 81 mg daily. Recommend titration of antianginal medications. Bryan Lemma, MD  ECHOCARDIOGRAM COMPLETE BUBBLE STUDY Result Date: 04/05/2023    ECHOCARDIOGRAM REPORT   Patient Name:   [facility]. Date of Exam: 04/05/2023 Medical Rec #:  [id number]                 Height:       68.0 in Accession #:    [id number]               Weight:  184.0 lb Date of Birth:  [birth year]                 BSA:          1.973 m Patient Age:    63 years                 BP:           127/83 mmHg Patient Gender: M                        HR:           68 bpm. Exam Location:  Inpatient Procedure: 2D Echo, Color Doppler, Cardiac Doppler, Saline Contrast Bubble Study            and Intracardiac Opacification Agent (Both Spectral and Color Flow            Doppler were utilized during procedure). Indications:    R07.9* Chest pain, unspecified  History:        Patient has prior history of Echocardiogram examinations, most                 recent 06/25/2022. CAD, Prior CABG, Arrythmias:Atrial                 Fibrillation; Risk Factors:Hypertension, Diabetes and                 Dyslipidemia.  Sonographer:    Irving Burton Senior RDCS Referring Phys: [id number] KARDIE TOBB IMPRESSIONS  1. Left ventricular ejection fraction, by estimation, is 50 to 55%. Left ventricular ejection fraction by PLAX is 59 %. The left ventricle has low normal function. The left ventricle demonstrates regional wall motion abnormalities (see scoring diagram/findings for description). Left ventricular diastolic parameters are consistent with Grade I diastolic dysfunction (impaired relaxation).  2. Right ventricular systolic function is low normal. The right ventricular size is normal. There is normal pulmonary artery systolic pressure. The estimated right ventricular systolic pressure is 12.9 mmHg.  3. The mitral valve is normal in structure. Trivial mitral valve regurgitation. No evidence of mitral stenosis.  4. The aortic valve is tricuspid. There is mild calcification of the aortic valve. Aortic valve regurgitation is trivial. Aortic valve sclerosis is present, with no evidence of aortic valve stenosis.  5. The inferior vena cava is normal in size with greater than 50% respiratory variability, suggesting right atrial pressure of 3  mmHg.  6. Agitated saline contrast bubble study was negative, with no evidence of any interatrial shunt. FINDINGS  Left Ventricle: Left ventricular ejection fraction, by estimation, is 50 to 55%. Left ventricular ejection fraction by PLAX is 59 %. The left ventricle has low normal function. The left ventricle demonstrates regional wall motion abnormalities. Definity  contrast agent was given IV to delineate the left ventricular endocardial borders. Strain imaging was not performed. The left ventricular internal cavity size was normal in size. There is no left ventricular hypertrophy. Left ventricular diastolic parameters are consistent with Grade I diastolic dysfunction (impaired relaxation). Indeterminate filling pressures.  LV Wall Scoring: The mid anteroseptal segment is akinetic. The basal anteroseptal segment, apical septal segment, and apex are hypokinetic. The entire anterior wall, entire lateral wall, entire inferior wall, mid inferoseptal segment, and basal inferoseptal segment are normal. Right Ventricle: The right ventricular size is normal. No increase in right ventricular wall thickness. Right ventricular systolic function is low normal. There is normal pulmonary artery systolic pressure. The tricuspid regurgitant velocity is 1.57 m/s,  and with an assumed right atrial pressure of 3 mmHg, the estimated right ventricular systolic pressure is 12.9 mmHg. Left [facility]: Left atrial size was normal in size. Right At[facility]: Right atrial size was normal in size. Pericardium: There is no evidence of pericardial effusion. Mitral Valve: The mitral valve is normal in structure. Trivial mitral valve regurgitation. No evidence of mitral valve stenosis. Tricuspid Valve: The tricuspid valve is normal in structure. Tricuspid valve regurgitation is trivial. No evidence of tricuspid stenosis. Aortic Valve: The aortic valve is tricuspid. There is mild calcification of the aortic valve. Aortic valve regurgitation is trivial.  Aortic valve sclerosis is present, with no evidence of aortic valve stenosis. Pulmonic Valve: The pulmonic valve was normal in structure. Pulmonic valve regurgitation is trivial. No evidence of pulmonic stenosis. Aorta: The ascending aorta was not well visualized and the aortic root is normal in size and structure. Venous: The inferior vena cava is normal in size with greater than 50% respiratory variability, suggesting right atrial pressure of 3 mmHg. IAS/Shunts: No atrial level shunt detected by color flow Doppler. Agitated saline contrast was given intravenously to evaluate for intracardiac shunting. Agitated saline contrast bubble study was negative, with no evidence of any interatrial shunt. Additional Comments: 3D imaging was not performed.  LEFT VENTRICLE PLAX 2D LV EF:         Left            Diastology                ventricular     LV e' medial:    5.91 cm/s                ejection        LV E/e' medial:  9.5                fraction by     LV e' lateral:   9.95 cm/s                PLAX is 59      LV E/e' lateral: 5.7                %. LVIDd:         4.20 cm LVIDs:         2.90 cm LV PW:         0.80 cm LV IVS:        0.70 cm LVOT diam:     2.10 cm LV SV:         59 LV SV Index:   30 LVOT Area:     3.46 cm  RIGHT VENTRICLE RV S prime:     9.17 cm/s TAPSE (M-mode): 1.0 cm LEFT ATRIUM             Index        RIGHT ATRIUM          Index LA diam:        3.10 cm 1.57 cm/m   RA Area:     9.47 cm LA Vol (A2C):   56.4 ml 28.59 ml/m  RA Volume:   14.50 ml 7.35 ml/m LA Vol (A4C):   30.0 ml 15.21 ml/m LA Biplane Vol: 45.1 ml 22.86 ml/m  AORTIC VALVE LVOT Vmax:   87.90 cm/s LVOT Vmean:  58.500 cm/s LVOT VTI:    0.170 m  AORTA Ao Root diam: 2.90 cm MITRAL VALVE  TRICUSPID VALVE MV Area (PHT): 2.55 cm    TR Peak grad:   9.9 mmHg MV Decel Time: 298 msec    TR Vmax:        157.00 cm/s MV E velocity: 56.30 cm/s MV A velocity: 78.50 cm/s  SHUNTS MV E/A ratio:  0.72        Systemic VTI:  0.17 m                             Systemic Diam: 2.10 cm Chilton Si MD Electronically signed by Chilton Si MD Signature Date/Time: 04/05/2023/12:57:56 PM    Final    DG Chest 2 View Result Date: 04/04/2023 CLINICAL DATA:  Left-sided chest pain. EXAM: CHEST - 2 VIEW COMPARISON:  07/13/2022. FINDINGS: Bilateral lung fields are clear. Bilateral costophrenic angles are clear. Normal cardio-mediastinal silhouette. There are surgical staples along the heart border and sternotomy wires, status post CABG (coronary artery bypass graft). No acute osseous abnormalities. The soft tissues are within normal limits. IMPRESSION: No active cardiopulmonary disease. Electronically Signed   By: Jules Schick M.D.   On: 04/04/2023 16:51    Microbiology: Results for orders placed or performed during the hospital encounter of 08/12/20  Resp Panel by RT-PCR (Flu A&B, Covid) Nasopharyngeal Swab     Status: None   Collection Time: 08/12/20  8:01 AM   Specimen: Nasopharyngeal Swab; Nasopharyngeal(NP) swabs in vial transport medium  Result Value Ref Range Status   SARS Coronavirus 2 by RT PCR NEGATIVE NEGATIVE Final    Comment: (NOTE) SARS-CoV-2 target nucleic acids are NOT DETECTED.  The SARS-CoV-2 RNA is generally detectable in upper respiratory specimens during the acute phase of infection. The lowest concentration of SARS-CoV-2 viral copies this assay can detect is 138 copies/mL. A negative result does not preclude SARS-Cov-2 infection and should not be used as the sole basis for treatment or other patient management decisions. A negative result may occur with  improper specimen collection/handling, submission of specimen other than nasopharyngeal swab, presence of viral mutation(s) within the areas targeted by this assay, and inadequate number of viral copies(<138 copies/mL). A negative result must be combined with clinical observations, patient history, and epidemiological information. The expected result is  Negative.  Fact Sheet for Patients:  BloggerCourse.com  Fact Sheet for Healthcare Providers:  SeriousBroker.it  This test is no t yet approved or cleared by the [place] FDA and  has been authorized for detection and/or diagnosis of SARS-CoV-2 by FDA under an Emergency Use Authorization (EUA). This EUA will remain  in effect (meaning this test can be used) for the duration of the COVID-19 declaration under Section 564(b)(1) of the Act, 21 U.S.C.section 360bbb-3(b)(1), unless the authorization is terminated  or revoked sooner.       Influenza A by PCR NEGATIVE NEGATIVE Final   Influenza B by PCR NEGATIVE NEGATIVE Final    Comment: (NOTE) The Xpert Xpress SARS-CoV-2/FLU/RSV plus assay is intended as an aid in the diagnosis of influenza from Nasopharyngeal swab specimens and should not be used as a sole basis for treatment. Nasal washings and aspirates are unacceptable for Xpert Xpress SARS-CoV-2/FLU/RSV testing.  Fact Sheet for Patients: BloggerCourse.com  Fact Sheet for Healthcare Providers: SeriousBroker.it  This test is not yet approved or cleared by the [place] FDA and has been authorized for detection and/or diagnosis of SARS-CoV-2 by FDA under an Emergency Use Authorization (EUA). This EUA will remain in effect (meaning this test  can be used) for the duration of the COVID-19 declaration under Section 564(b)(1) of the Act, 21 U.S.C. section 360bbb-3(b)(1), unless the authorization is terminated or revoked.  Performed at [facility], [address] [place], [place] [address]     Labs: CBC: Recent Labs  Lab 04/04/23 1413 04/05/23 0501 04/06/23 0421  WBC 11.3* 7.4 6.1  NEUTROABS 8.6*  --   --   HGB 13.8 13.1 13.0  HCT 40.1 38.4* 37.4*  MCV 93.9 95.8 94.2  PLT 154 139* 135*   Basic Metabolic Panel: Recent Labs  Lab 04/04/23 1413  04/05/23 0501 04/06/23 0421  NA 135 135 134*  K 3.8 4.1 3.7  CL 101 101 102  CO2 22 23 22   GLUCOSE 224* 211* 224*  BUN 18 14 18   CREATININE 1.33* 1.28* 1.30*  CALCIUM 9.4 9.2 8.9   Liver Function Tests: Recent Labs  Lab 04/04/23 1413  AST 26  ALT 27  ALKPHOS 47  BILITOT 0.8  PROT 7.5  ALBUMIN 3.7   CBG: Recent Labs  Lab 04/05/23 0728 04/05/23 1312 04/05/23 1722 04/05/23 2108 04/06/23 0801  GLUCAP 217* 143* 118* 275* 251*    Discharge time spent: 36 minutes.  Signed: [name], MD [facility] 04/06/2023

## 2023-04-08 ENCOUNTER — Telehealth: Payer: Self-pay | Admitting: *Deleted

## 2023-04-08 ENCOUNTER — Telehealth: Payer: Self-pay

## 2023-04-08 ENCOUNTER — Encounter (HOSPITAL_COMMUNITY): Payer: Self-pay | Admitting: Cardiology

## 2023-04-08 NOTE — Transitions of Care (Post Inpatient/ED Visit) (Signed)
   04/08/2023  Name: Antawn Sison [facility]. MRN: [id number] DOB: [birth year]  Today's TOC FU Call Status: Today's TOC FU Call Status:: Unsuccessful Call (1st Attempt) Unsuccessful Call (1st Attempt) Date: 04/08/23  Attempted to reach the patient regarding the most recent Inpatient/ED visit.  Follow Up Plan: Additional outreach attempts will be made to reach the patient to complete the Transitions of Care (Post Inpatient/ED visit) call.   Gean Maidens BSN RN [facility] Health Care Management Coordinator [email or web address] Direct Dial: [phone]  Fax: [phone] Website: [email or web address]

## 2023-04-08 NOTE — Transitions of Care (Post Inpatient/ED Visit) (Signed)
   04/08/2023  Name: Colton Guzman [facility] MRN: [id number] DOB: 1960/07/07  Today's TOC FU Call Status: Today's TOC FU Call Status:: Unsuccessful Call (1st Attempt) Unsuccessful Call (1st Attempt) Date: 04/08/23  Attempted to reach the patient regarding the most recent Inpatient/ED visit.  Follow Up Plan: Additional outreach attempts will be made to reach the patient to complete the Transitions of Care (Post Inpatient/ED visit) call.   Signature Jodelle Green, RMA

## 2023-04-09 ENCOUNTER — Telehealth: Payer: Self-pay | Admitting: *Deleted

## 2023-04-09 LAB — LIPOPROTEIN A (LPA): Lipoprotein (a): <8.4 nmol/L (ref <75.0)

## 2023-04-09 MED FILL — Lidocaine HCl Local Preservative Free (PF) Inj 1%: INTRAMUSCULAR | Qty: 30 | Status: AC

## 2023-04-09 NOTE — Transitions of Care (Post Inpatient/ED Visit) (Signed)
04/09/2023  Name: [facility]. MRN: [id number] DOB: 13-Dec-1960  Today's TOC FU Call Status: Today's TOC FU Call Status:: Successful TOC FU Call Completed Unsuccessful Call (1st Attempt) Date: 04/08/23 [facility] FU Call Complete Date: 04/09/23 Patient's Name and Date of Birth confirmed.  Transition Care Management Follow-up Telephone Call Date of Discharge: 04/04/23 Discharge Facility: [facility]) Type of Discharge: Emergency Department Primary Inpatient Discharge Diagnosis:: chest pain Reason for ED Visit: Cardiac Conditions Cardiac Conditions Diagnosis: Chest Pain Persisting How have you been since you were released from the hospital?: Better Any questions or concerns?: No  Items Reviewed: Did you receive and understand the discharge instructions provided?: Yes Medications obtained,verified, and reconciled?: Yes (Medications Reviewed) Any new allergies since your discharge?: No Dietary orders reviewed?: No Do you have support at home?: No  Medications Reviewed Today: Medications Reviewed Today     Reviewed by Leroy Kennedy, CMA (Certified Medical Assistant) on 04/09/23 at 0933  Med List Status: <None>   Medication Order Taking? Sig Documenting Provider Last Dose Status Informant  allopurinol (ZYLOPRIM) 100 MG tablet [id number] Yes Take 1 tablet (100 mg total) by mouth daily. Etta Grandchild, MD Taking Active Self, Pharmacy Records  apixaban [facility]) 5 MG TABS tablet [id number] Yes Take 1 tablet (5 mg total) by mouth 2 (two) times daily. Meriam Sprague, MD Taking Active Self, Pharmacy Records  ASPIRIN LOW DOSE 81 MG EC tablet [id number] Yes Take 81 mg by mouth every 6 (six) hours as needed for moderate pain. [provider] Taking Active Self, Pharmacy Records           Med Note Colton Guzman, [place] A   Thu Apr 04, 2023  8:00 PM) Took four tablets last night  Blood Pressure Monitoring (BLOOD PRESSURE DIGITAL SOLN) KIT [id number] Yes  [provider] Taking Active Self, Pharmacy Records  carvedilol (COREG) 12.5 MG tablet [id number] Yes Take 1 tablet (12.5 mg total) by mouth 2 (two) times daily. [name], MD Taking Active Self, Pharmacy Records  Continuous Glucose Receiver (FREESTYLE LIBRE 3 READER) DEVI [id number] Yes Use to monitor blood sugar as directed Colton Duster, MD Taking Active   Continuous Glucose Sensor (FREESTYLE LIBRE 3 SENSOR) [place] [id number] Yes Place 1 sensor on the skin every 14 days. Use to check glucose continuously Colton Duster, MD Taking Active   empagliflozin (JARDIANCE) 10 MG TABS tablet [id number] Yes Take 1 tablet (10 mg total) by mouth daily before breakfast. [name], MD Taking Active Self, Pharmacy Records  Evolocumab [facility] SURECLICK) 140 MG/ML [place]ory Broad [id number] Yes Inject 140 mg into the skin every 14 (fourteen) days. Meriam Sprague, MD Taking Active Self, Pharmacy Records  ezetimibe (ZETIA) 10 MG tablet [id number] Yes Take 1 tablet (10 mg total) by mouth daily. [name], MD Taking Active Self, Pharmacy Records  Fe Bisgly-Vit C-Vit B12-FA (GENTLE IRON) 28-60-0.008-0.4 MG CAPS [id number] Yes Take 1 tablet by mouth in the morning. [provider] Taking Active Self, Pharmacy Records  isosorbide mononitrate (IMDUR) 30 MG 24 hr tablet [id number] Yes Take 1 tablet (30 mg total) by mouth daily. [name], MD Taking Active Self, Pharmacy Records  Magnesium 300 MG CAPS [id number] Yes Take 300 mg by mouth daily. [provider] Taking Active Self, Pharmacy Records  Multiple Vitamin (MULTIVITAMIN WITH MINERALS) TABS tablet [id number] Yes Take 1 tablet by mouth daily. [provider] Taking Active Self, Pharmacy Records  nitroGLYCERIN (NITROSTAT) 0.4 MG SL tablet [id number] Yes Place  1 tablet (0.4 mg total) under the tongue every 5 (five) minutes as needed for chest pain (not more than 3 doses). Colton Duster, MD  Taking Active   sacubitril-valsartan [facility]) 24-26 MG [id number] Yes Take 1 tablet by mouth 2 (two) times daily. Meriam Sprague, MD Taking Active Self, Pharmacy Records  Semaglutide [facility]) 14 MG TABS [id number] Yes Take 1 tablet (14 mg total) by mouth daily. Colton Decent, FNP Taking Active Self, Pharmacy Records  spironolactone (ALDACTONE) 25 MG tablet [id number] Yes Take 1/2 tablet (12.5 mg total) by mouth daily. Meriam Sprague, MD Taking Active Self, Pharmacy Records  traMADol Janean Sark) 50 MG tablet [id number] Yes Take 2 tablets (100 mg total) by mouth daily as needed for up to 10 days. Colton Duster, MD Taking Active   vitamin B-12 (CYANOCOBALAMIN) 1000 MCG tablet [id number] Yes Take 1,000 mcg by mouth in the morning. [provider] Taking Active Self, Pharmacy Records            Home Care and Equipment/Supplies: Were Home Health Services Ordered?: No Any new equipment or medical supplies ordered?: No  Functional Questionnaire: Do you need assistance with bathing/showering or dressing?: No Do you need assistance with meal preparation?: No Do you need assistance with eating?: No Do you have difficulty maintaining continence: No Do you need assistance with getting out of bed/getting out of a chair/moving?: No Do you have difficulty managing or taking your medications?: No  Follow up appointments reviewed: PCP Follow-up appointment confirmed?: Yes Date of PCP follow-up appointment?: 04/17/23 Follow-up Provider: [name] NP [facility] Follow-up appointment confirmed?: NA Do you need transportation to your follow-up appointment?: No Do you understand care options if your condition(s) worsen?: Yes-patient verbalized understanding    SIGNATURE [name]

## 2023-04-09 NOTE — Transitions of Care (Post Inpatient/ED Visit) (Signed)
   04/09/2023  Name: Tadan Shill [facility]. MRN: [id number] DOB: [birth year]  Today's TOC FU Call Status: Today's TOC FU Call Status:: Unsuccessful Call (2nd Attempt) Unsuccessful Call (2nd Attempt) Date: 04/09/23  Attempted to reach the patient regarding the most recent Inpatient/ED visit.  Follow Up Plan: Additional outreach attempts will be made to reach the patient to complete the Transitions of Care (Post Inpatient/ED visit) call.   Gean Maidens BSN RN [place] [facility] Care Management Coordinator [email or web address] Direct Dial: [phone]  Fax: [phone] Website: [email or web address]

## 2023-04-10 ENCOUNTER — Other Ambulatory Visit (HOSPITAL_COMMUNITY): Payer: Self-pay

## 2023-04-10 ENCOUNTER — Telehealth: Payer: Self-pay | Admitting: *Deleted

## 2023-04-10 NOTE — Transitions of Care (Post Inpatient/ED Visit) (Signed)
   04/10/2023  Name: Colton Guzman [facility]. MRN: [id number] DOB: Sep 25, 1960  Today's TOC FU Call Status: Today's TOC FU Call Status:: Unsuccessful Call (3rd Attempt) Unsuccessful Call (3rd Attempt) Date: 04/10/23  Attempted to reach the patient regarding the most recent Inpatient/ED visit.  Follow Up Plan: No further outreach attempts will be made at this time. We have been unable to contact the patient.  Gean Maidens BSN RN [place] [facility] Health Care Management Coordinator [email or web address] Direct Dial: [phone]  Fax: [phone] Website: [email or web address]

## 2023-04-11 ENCOUNTER — Telehealth: Payer: Self-pay | Admitting: Cardiovascular Disease

## 2023-04-11 NOTE — Telephone Encounter (Signed)
New Message:      Patient would like to switch from Dr Pearletha Alfred to Dr Jacinto Halim. Is this alright with you?

## 2023-04-17 ENCOUNTER — Encounter: Payer: Self-pay | Admitting: Family

## 2023-04-17 ENCOUNTER — Encounter: Payer: Self-pay | Admitting: Internal Medicine

## 2023-04-17 ENCOUNTER — Ambulatory Visit (INDEPENDENT_AMBULATORY_CARE_PROVIDER_SITE_OTHER): Payer: Medicare Other | Admitting: Internal Medicine

## 2023-04-17 VITALS — BP 110/80 | HR 72 | Temp 98.2°F | Ht 68.0 in | Wt 184.6 lb

## 2023-04-17 DIAGNOSIS — I255 Ischemic cardiomyopathy: Secondary | ICD-10-CM | POA: Diagnosis not present

## 2023-04-17 DIAGNOSIS — I480 Paroxysmal atrial fibrillation: Secondary | ICD-10-CM | POA: Diagnosis not present

## 2023-04-17 DIAGNOSIS — E785 Hyperlipidemia, unspecified: Secondary | ICD-10-CM

## 2023-04-17 DIAGNOSIS — I152 Hypertension secondary to endocrine disorders: Secondary | ICD-10-CM

## 2023-04-17 DIAGNOSIS — I25702 Atherosclerosis of coronary artery bypass graft(s), unspecified, with refractory angina pectoris: Secondary | ICD-10-CM | POA: Diagnosis not present

## 2023-04-17 DIAGNOSIS — E1142 Type 2 diabetes mellitus with diabetic polyneuropathy: Secondary | ICD-10-CM | POA: Diagnosis not present

## 2023-04-17 DIAGNOSIS — E1159 Type 2 diabetes mellitus with other circulatory complications: Secondary | ICD-10-CM

## 2023-04-17 DIAGNOSIS — M1A09X0 Idiopathic chronic gout, multiple sites, without tophus (tophi): Secondary | ICD-10-CM

## 2023-04-17 DIAGNOSIS — Z7984 Long term (current) use of oral hypoglycemic drugs: Secondary | ICD-10-CM

## 2023-04-17 DIAGNOSIS — E1169 Type 2 diabetes mellitus with other specified complication: Secondary | ICD-10-CM

## 2023-04-17 LAB — POCT GLYCOSYLATED HEMOGLOBIN (HGB A1C): Hemoglobin A1C: 8.4 % — AB (ref 4.0–5.6)

## 2023-04-17 MED ORDER — ALLOPURINOL 100 MG PO TABS: 100.0000 mg | ORAL_TABLET | Freq: Every day | ORAL | 3 refills | Status: DC

## 2023-04-17 MED ORDER — GABAPENTIN 100 MG PO CAPS
ORAL_CAPSULE | ORAL | 0 refills | Status: DC
Start: 2023-04-17 — End: 2023-05-29

## 2023-04-17 NOTE — Progress Notes (Signed)
 [facility]IMARY CARE [facility] [address] [place] [address] Dept: [phone] Dept Fax: [phone]    Subjective:   Colton Guzman. 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  Discussed the use of AI scribe software for clinical note transcription with the patient, who gave verbal consent to proceed.  History of Present Illness         The patient, with a history of coronary artery disease with bypass graft, paroxysmal atrial fibrillation, hypertension, hyperlipidemia, type 2 diabetes, ischemic cardiomyopathy, embolic cerebrovascular accident, and a history of gastrointestinal bleed from diverticulosis and gastric ulcers, presents for hospital follow up for chest pain.Patient was admitted on 04/04/2023, discharged on 04/06/2023.  During their hospital stay, cardiac enzymes were done, and troponin was negative. An EKG showed no significant ischemia. Echo with 50-55% EF. A cardiac catheterization on 04/05/2023 revealed severe native vessel disease of the left coronary artery, with 100% LCX and LAD with patent D1 stent. However, the distal D1 is very small in caliber. The native right coronary artery was diffusely ectatic with moderate diffuse irregularity but no focal lesions. There was diffuse calcification. Given the results and findings of the cardiac catheterization, medical management was recommended. Patient has been complaint with medications. He has a follow up with cardiology on 05/13/23.   The patient's type 2 diabetes is being managed with Jardiance and Rybelsus. Was recently switched from [place] to [place] due to insurance. The patient's A1c has decreased from 9.1 in October of 2024 to 8.4% currently. He does report eating sweets , pizza, pasta. The patient also complains  of neuropathy in their feet, which is worse at bedtime. The patient has taken tramadol in the past, but it does not completely alleviate the pain.    The following portions of the patient's history were reviewed and updated as appropriate: past medical history, past surgical history, family history, social history, allergies, medications, and problem list.   Patient Active Problem List   Diagnosis Date Noted   Chest pain 04/04/2023   History of CVA (cerebrovascular accident) 04/04/2023   Retinopathy of right eye 11/07/2022   Gastroesophageal reflux disease with esophagitis without hemorrhage 07/31/2022   Coronary artery disease involving native coronary artery of native heart without angina pectoris 05/19/2022   Diabetic polyneuropathy associated with type 2 diabetes mellitus (HCC) 05/19/2022   Chronic renal disease, stage 4, severely decreased glomerular filtration rate (GFR) between 15-29 mL/min/1.73 square meter (HCC) 11/14/2021   Insulin-requiring or dependent type II diabetes mellitus (HCC) 03/28/2021   Iron deficiency anemia due to chronic blood loss 08/26/2020   Paroxysmal atrial fibrillation (HCC) 08/12/2020   Sinus tachycardia    Vitamin B12 deficiency anemia due to intrinsic factor deficiency 05/19/2020   Primary osteoarthritis involving multiple joints 05/19/2020   Hyperlipidemia associated with type 2 diabetes mellitus (HCC) 05/18/2020   Type 2 diabetes mellitus (HCC) 02/22/2020   Hypertension associated with diabetes (HCC)    Gout    CAD (coronary artery disease) of artery bypass graft 02/2020   Past Medical History:  Diagnosis Date   Anemia    hx iron transfusion   Arthritis    left shoulder   CAD (coronary artery disease) of artery bypass graft 02/2020   Diabetes mellitus without complication (HCC)    type 2   Elevated blood uric acid level  GI bleed    History of blood transfusion 08/19/2020   Hyperlipidemia LDL goal <70    Hypertension    PAF (paroxysmal  atrial fibrillation) (HCC)    Wears partial dentures    upper dent/lower partial   Past Surgical History:  Procedure Laterality Date   BIOPSY  08/17/2020   Procedure: BIOPSY;  Surgeon: Napoleon Form, MD;  Location: [facility] ENDOSCOPY;  Service: Endoscopy;;   COLONOSCOPY WITH PROPOFOL Left 02/27/2020   Procedure: COLONOSCOPY WITH PROPOFOL;  Surgeon: Kathi Der, MD;  Location: [facility] ENDOSCOPY;  Service: Gastroenterology;  Laterality: Left;   COLONOSCOPY WITH PROPOFOL N/A 08/13/2020   Procedure: COLONOSCOPY WITH PROPOFOL;  Surgeon: Iva Boop, MD;  Location: [facility] ENDOSCOPY;  Service: Endoscopy;  Laterality: N/A;   CORONARY ARTERY BYPASS GRAFT N/A 03/08/2020   Procedure: CORONARY ARTERY BYPASS GRAFTING (CABG) x 5 ON PUMP;  Surgeon: [place] Dolin, MD;  Location: [facility] OR;  Service: Open Heart Surgery;  Laterality: N/A;   ENDOVEIN HARVEST OF GREATER SAPHENOUS VEIN Right 03/08/2020   Procedure: ENDOVEIN HARVEST OF GREATER SAPHENOUS VEIN;  Surgeon: [place] Dolin, MD;  Location: [facility] OR;  Service: Open Heart Surgery;  Laterality: Right;   ESOPHAGOGASTRODUODENOSCOPY (EGD) WITH PROPOFOL N/A 02/27/2020   Procedure: ESOPHAGOGASTRODUODENOSCOPY (EGD) WITH PROPOFOL;  Surgeon: Kathi Der, MD;  Location: [facility] ENDOSCOPY;  Service: Gastroenterology;  Laterality: N/A;   ESOPHAGOGASTRODUODENOSCOPY (EGD) WITH PROPOFOL N/A 08/17/2020   Procedure: ESOPHAGOGASTRODUODENOSCOPY (EGD) WITH PROPOFOL;  Surgeon: Napoleon Form, MD;  Location: [facility] ENDOSCOPY;  Service: Endoscopy;  Laterality: N/A;   INSERTION OF MESH Left 05/09/2021   Procedure: INSERTION OF MESH;  Surgeon: Axel Filler, MD;  Location: [facility] OR;  Service: General;  Laterality: Left;   LEFT HEART CATH AND CORONARY ANGIOGRAPHY N/A 03/04/2020   Procedure: LEFT HEART CATH AND CORONARY ANGIOGRAPHY;  Surgeon: [name], MD;  Location: [facility] INVASIVE CV LAB;  Service: Cardiovascular;  Laterality: N/A;   LEFT HEART CATH AND CORS/GRAFTS ANGIOGRAPHY  N/A 04/05/2023   Procedure: LEFT HEART CATH AND CORS/GRAFTS ANGIOGRAPHY;  Surgeon: Marykay Lex, MD;  Location: [facility] INVASIVE CV LAB;  Service: Cardiovascular;  Laterality: N/A;   RADIAL ARTERY HARVEST Left 03/08/2020   Procedure: RADIAL ARTERY HARVEST;  Surgeon: [place] Dolin, MD;  Location: [facility] OR;  Service: Open Heart Surgery;  Laterality: Left;   TEE WITHOUT CARDIOVERSION N/A 03/08/2020   Procedure: TRANSESOPHAGEAL ECHOCARDIOGRAM (TEE);  Surgeon: [place] Dolin, MD;  Location: [facility] OR;  Service: Open Heart Surgery;  Laterality: N/A;   XI ROBOTIC ASSISTED INGUINAL HERNIA REPAIR WITH MESH Left 05/09/2021   Procedure: XI ROBOTIC ASSISTED LEFT INGUINAL HERNIA REPAIR WITH MESH;  Surgeon: Axel Filler, MD;  Location: [facility] OR;  Service: General;  Laterality: Left;   Family History  Problem Relation Age of Onset   Stroke Mother    Migraines Mother    Hypertension Mother    Hyperlipidemia Mother    Heart failure Mother    Diabetes Mother    Hypertension Father    Hyperlipidemia Father    Heart failure Father    Diabetes Father    Asthma Father    Rheum arthritis Sister    Osteoarthritis Sister    Thyroid disease Sister    Rheum arthritis Brother    Osteoarthritis Brother    Thyroid disease Brother     Current Outpatient Medications:    apixaban (ELIQUIS) 5 MG TABS tablet, Take 1 tablet (5 mg total) by mouth 2 (two) times daily., Disp: 60 tablet,  Rfl: 6   ASPIRIN LOW DOSE 81 MG EC tablet, Take 81 mg by mouth every 6 (six) hours as needed for moderate pain., Disp: , Rfl:    Blood Pressure Monitoring (BLOOD PRESSURE DIGITAL SOLN) KIT, , Disp: , Rfl:    carvedilol (COREG) 12.5 MG tablet, Take 1 tablet (12.5 mg total) by mouth 2 (two) times daily., Disp: 180 tablet, Rfl: 0   Continuous Glucose Receiver (FREESTYLE LIBRE 3 READER) DEVI, Use to monitor blood sugar as directed, Disp: 1 each, Rfl: 3   Continuous Glucose Sensor (FREESTYLE LIBRE 3 SENSOR) MISC, Place 1 sensor on the skin every  14 days. Use to check glucose continuously, Disp: 2 each, Rfl: 5   empagliflozin (JARDIANCE) 10 MG TABS tablet, Take 1 tablet (10 mg total) by mouth daily before breakfast., Disp: 30 tablet, Rfl: 12   Evolocumab (REPATHA SURECLICK) 140 MG/ML SOAJ, Inject 140 mg into the skin every 14 (fourteen) days., Disp: 2 mL, Rfl: 11   ezetimibe (ZETIA) 10 MG tablet, Take 1 tablet (10 mg total) by mouth daily., Disp: 90 tablet, Rfl: 3   Fe Bisgly-Vit C-Vit B12-FA (GENTLE IRON) 28-60-0.008-0.4 MG CAPS, Take 1 tablet by mouth in the morning., Disp: , Rfl:    gabapentin (NEURONTIN) 100 MG capsule, Take 1 tablet by mouth at bedtime., Disp: 90 capsule, Rfl: 0   isosorbide mononitrate (IMDUR) 30 MG 24 hr tablet, Take 1 tablet (30 mg total) by mouth daily., Disp: 90 tablet, Rfl: 3   Magnesium 300 MG CAPS, Take 300 mg by mouth daily., Disp: , Rfl:    Multiple Vitamin (MULTIVITAMIN WITH MINERALS) TABS tablet, Take 1 tablet by mouth daily., Disp: , Rfl:    nitroGLYCERIN (NITROSTAT) 0.4 MG SL tablet, Place 1 tablet (0.4 mg total) under the tongue every 5 (five) minutes as needed for chest pain (not more than 3 doses)., Disp: 100 tablet, Rfl: 3   sacubitril-valsartan (ENTRESTO) 24-26 MG, Take 1 tablet by mouth 2 (two) times daily., Disp: 180 tablet, Rfl: 3   Semaglutide (RYBELSUS) 14 MG TABS, Take 1 tablet (14 mg total) by mouth daily., Disp: 90 tablet, Rfl: 1   spironolactone (ALDACTONE) 25 MG tablet, Take 1/2 tablet (12.5 mg total) by mouth daily., Disp: 45 tablet, Rfl: 3   vitamin B-12 (CYANOCOBALAMIN) 1000 MCG tablet, Take 1,000 mcg by mouth in the morning., Disp: , Rfl:    allopurinol (ZYLOPRIM) 100 MG tablet, Take 1 tablet (100 mg total) by mouth daily., Disp: 90 tablet, Rfl: 3 Allergies  Allergen Reactions   Atorvastatin Other (See Comments)    Significant elevation in LFTs while on statins   Crestor [Rosuvastatin] Other (See Comments)    Significant elevation in LFTs while on statins   Metformin And Related  Other (See Comments)   Propofol Other (See Comments)    Encephalopathy   Insulin Palpitations    Bad reaction      ROS: A complete ROS was performed with pertinent positives/negatives noted in the HPI. The remainder of the ROS are negative.    Objective:   Today's Vitals   04/17/23 1046  BP: 110/80  Pulse: 72  Temp: 98.2 F (36.8 C)  TempSrc: Temporal  SpO2: 98%  Weight: 184 lb 9.6 oz (83.7 kg)  Height: 5\' 8"  (1.727 m)    GENERAL: Well-appearing, in NAD. Well nourished.  SKIN: Pink, warm and dry. No rash, lesion, ulceration, or ecchymoses.  NECK: Trachea midline. Full ROM w/o pain or tenderness. No lymphadenopathy.  RESPIRATORY: Chest wall symmetrical.  Respirations even and non-labored. Breath sounds clear to auscultation bilaterally.  CARDIAC: S1, S2 present, regular rate and rhythm. Peripheral pulses 2+ bilaterally.  EXTREMITIES: Without clubbing, cyanosis, or edema.  NEUROLOGIC: No motor or sensory deficits. Steady, even gait. Sensory exam of the foot shows significant decreased sensation bilaterally.  tested with the monofilament. Good pulses, no lesions or ulcers, good peripheral pulses. PSYCH/MENTAL STATUS: Alert, oriented x 3. Cooperative, appropriate mood and affect.   Health Maintenance Due  Topic Date Due   Medicare Annual Wellness (AWV)  Never done   Zoster Vaccines- Shingrix (2 of 2) 05/23/2021   Diabetic kidney evaluation - Urine ACR  05/15/2023    Results for orders placed or performed in visit on 04/17/23  POCT glycosylated hemoglobin (Hb A1C)  Result Value Ref Range   Hemoglobin A1C 8.4 (A) 4.0 - 5.6 %   HbA1c POC (<> result, manual entry)     HbA1c, POC (prediabetic range)     HbA1c, POC (controlled diabetic range)      The ASCVD Risk score (Arnett DK, et al., 2019) failed to calculate for the following reasons:   Risk score cannot be calculated because patient has a medical history suggesting prior/existing ASCVD     Assessment & Plan:    Coronary Artery Disease / Ischemic Cardiomyopathy:  Recent hospitalization for chest pain. Cardiac catheterization revealed severe native vessel disease of the left coronary artery, 100% LCX and LAD with patent D1 stent. No clear or obvious lesion distally. Native right coronary artery is diffusely ectatic with moderate diffuse irregularity but no focal lesions. Diffuse calcification seen. Medical management recommended. -Continue Eliquis as currently prescribed. -Continue Aspirin 81mg  daily. -Titration of antianginal medications as needed. -Follow up with cardiology on May 13, 2022.  Paroxysmal Atrial Fibrillation:  Stable. -Continue Coreg 12.5mg  BID. -Continue Eliquis.  Hypertension:  Stable. -Continue Coreg 12.5mg  BID. -Continue Entresto and Spironolactone.  Hyperlipidemia:  Intolerant of statins due to elevated LFTs in the past. Currently on Zetia and Repatha. -Continue Zetia and Repatha.  Type 2 Diabetes:  A1c is 8.4%, down from 9.1 in October 2024. -Continue Jardiance 10mg  daily. Patient has been taking jardiance for 1.5 weeks. If tolerated, will increase Jardiance to 25mg  at next office visit to improve A1C control.  -Continue Rybelsus 14mg  daily. - Advised patient to cut back on sweets and monitor carb intake at each meal   Neuropathy:  Complaints of neuropathy in feet, worse at bedtime. Tramadol provides partial relief. -Start Gabapentin 100mg  at bedtime, titrate as necessary. Wife is hesitant with medication due to side effects. Will start at low dose and titrate if patient tolerates well.  -Follow up in six weeks for neuropathy management.  Gout:  Stable  Needs refill of allopurinol.    Orders Placed This Encounter  Procedures   POCT glycosylated hemoglobin (Hb A1C)   No images are attached to the encounter or orders placed in the encounter. Meds ordered this encounter  Medications   allopurinol (ZYLOPRIM) 100 MG tablet    Sig: Take 1 tablet (100 mg  total) by mouth daily.    Dispense:  90 tablet    Refill:  3   gabapentin (NEURONTIN) 100 MG capsule    Sig: Take 1 tablet by mouth at bedtime.    Dispense:  90 capsule    Refill:  0    Supervising Provider:   Garnette Gunner [[id number]]    Return in about 6 weeks (around 05/29/2023) for neuropathy management.   [name], FNP

## 2023-04-22 ENCOUNTER — Other Ambulatory Visit (HOSPITAL_COMMUNITY): Payer: Self-pay

## 2023-04-23 ENCOUNTER — Other Ambulatory Visit: Payer: Self-pay | Admitting: *Deleted

## 2023-04-23 DIAGNOSIS — I480 Paroxysmal atrial fibrillation: Secondary | ICD-10-CM

## 2023-04-23 MED ORDER — APIXABAN 5 MG PO TABS: 5.0000 mg | ORAL_TABLET | Freq: Two times a day (BID) | ORAL | 5 refills | Status: DC

## 2023-04-23 NOTE — Telephone Encounter (Signed)
 Eliquis 5mg  refill request received. Patient is 63 years old, weight-83.7kg, Crea-1.30 on 04/06/23, Diagnosis-Afib, and last seen by [name] on 01/24/23. Dose is appropriate based on dosing criteria. Will send in refill to requested pharmacy.

## 2023-04-26 ENCOUNTER — Encounter: Payer: Self-pay | Admitting: Family

## 2023-04-29 ENCOUNTER — Encounter: Payer: Self-pay | Admitting: Family

## 2023-04-30 ENCOUNTER — Encounter: Payer: Self-pay | Admitting: Family

## 2023-05-06 ENCOUNTER — Other Ambulatory Visit: Payer: Self-pay | Admitting: Internal Medicine

## 2023-05-06 DIAGNOSIS — E785 Hyperlipidemia, unspecified: Secondary | ICD-10-CM

## 2023-05-06 DIAGNOSIS — I5023 Acute on chronic systolic (congestive) heart failure: Secondary | ICD-10-CM

## 2023-05-06 DIAGNOSIS — I25810 Atherosclerosis of coronary artery bypass graft(s) without angina pectoris: Secondary | ICD-10-CM

## 2023-05-06 DIAGNOSIS — Z794 Long term (current) use of insulin: Secondary | ICD-10-CM

## 2023-05-06 DIAGNOSIS — Z951 Presence of aortocoronary bypass graft: Secondary | ICD-10-CM

## 2023-05-06 DIAGNOSIS — Z789 Other specified health status: Secondary | ICD-10-CM

## 2023-05-06 DIAGNOSIS — I2510 Atherosclerotic heart disease of native coronary artery without angina pectoris: Secondary | ICD-10-CM

## 2023-05-06 DIAGNOSIS — E119 Type 2 diabetes mellitus without complications: Secondary | ICD-10-CM

## 2023-05-06 DIAGNOSIS — Z79899 Other long term (current) drug therapy: Secondary | ICD-10-CM

## 2023-05-06 DIAGNOSIS — I480 Paroxysmal atrial fibrillation: Secondary | ICD-10-CM

## 2023-05-06 DIAGNOSIS — M1A09X0 Idiopathic chronic gout, multiple sites, without tophus (tophi): Secondary | ICD-10-CM

## 2023-05-06 DIAGNOSIS — E782 Mixed hyperlipidemia: Secondary | ICD-10-CM

## 2023-05-06 NOTE — Telephone Encounter (Unsigned)
 Copied from CRM [id number]. Topic: Clinical - Medication Refill >> May 06, 2023 12:26 PM Elizebeth Brooking wrote: Most Recent Primary Care Visit:  Provider: [name]  Department: [facility]  Visit Type: HOSPITAL FOLLOW UP  Date: 04/17/2023  Medication: spironolactone (ALDACTONE) 25 MG tablet   Evolocumab (REPATHA SURECLICK) 140 MG/ML SOAJ allopurinol (ZYLOPRIM) 100 MG tablet  Continuous Glucose Sensor (FREESTYLE LIBRE 3 SENSOR) MISC  Has the patient contacted their pharmacy? No (Agent: If no, request that the patient contact the pharmacy for the refill. If patient does not wish to contact the pharmacy document the reason why and proceed with request.) (Agent: If yes, when and what did the pharmacy advise?)  Is this the correct pharmacy for this prescription? Yes If no, delete pharmacy and type the correct one.  This is the patient's preferred pharmacy:  [id number] [place], [place] - [address] [place] [address] Phone: [phone] Fax: [phone]     Has the prescription been filled recently? No  Is the patient out of the medication? Yes  Has the patient been seen for an appointment in the last year OR does the patient have an upcoming appointment? Yes  Can we respond through MyChart? Yes  Agent: Please be advised that Rx refills may take up to 3 business days. We ask that you follow-up with your pharmacy.

## 2023-05-07 MED ORDER — ALLOPURINOL 100 MG PO TABS: 100.0000 mg | ORAL_TABLET | Freq: Every day | ORAL | 3 refills | Status: AC

## 2023-05-07 MED ORDER — FREESTYLE LIBRE 3 SENSOR MISC
1.0000 | Freq: Every day | 11 refills | Status: AC
Start: 2023-05-07 — End: 2023-08-05

## 2023-05-07 MED ORDER — SPIRONOLACTONE 25 MG PO TABS: 12.5000 mg | ORAL_TABLET | Freq: Every day | ORAL | 3 refills | Status: DC

## 2023-05-07 NOTE — Telephone Encounter (Signed)
 Requesting: allopurinol (ZYLOPRIM) 100 MG tablet , Continuous Glucose Sensor (FREESTYLE LIBRE 3 SENSOR) MISC, Evolocumab (REPATHA SURECLICK) 140 MG/ML SOAJ, spironolactone (ALDACTONE) 25 MG tablet  Last Visit: 04/17/2023 Next Visit: 05/29/2023 Last Refill: 04/17/2023, 04/06/23 by Dr. Clide Dales, 04/03/22 by Dr. Shari Prows, 04/13/22 by Dr. Shari Prows  Please Advise

## 2023-05-10 NOTE — Progress Notes (Unsigned)
 Cardiology Office Note    Date:  05/13/2023  ID:  Colton Guzman., DOB [birth year], MRN [id number] PCP:  [name], FNP  Cardiologist:  Verne Carrow, MD  Electrophysiologist:  Lanier Prude, MD   Chief Complaint: Follow up for CAD   History of Present Illness: .    Colton Guzman. is a 63 y.o. male with visit-pertinent history of DM 2, hyperlipidemia and hypertension, CAD s/p CABG on 03/08/2020.    In 02/2020 patient was hospitalized after presenting with hematochezia from a diverticular source.  On 02/2020 he developed chest pain in setting of a bloody bowel movement that initially responded to sublingual nitroglycerin but then recurred with subsequent VT arrest requiring 2 to 5 minutes of CPR and shock.  Post shock rhythm was A-fib.  Troponin peaked at 15553.  On 03/04/2020 he underwent coronary angiography which showed 70% stenosis of the proximal RCA with 50% stenosis of the distal RCA, 70% stenosis of the right PDA and 99% stenosis of the third right PL, 99% stenosis of the second right PL, 90% stenosis of the mid LAD with an ostial LAD stenosis of 70%, 100% stenosis of the ostial to proximal circumflex and 60% stenosis of the mid LM to ostial LAD.  On 03/08/2020 he underwent successful CABG x 5 with LIMA to distal LAD, SVG to PDA, SVG to posterior lateral branch of the right coronary artery, left radial artery to the ramus intermediate and first obtuse marginal coronary arteries.  He was discharged home in stable condition on 03/13/2020.  Outpatient cardiac monitor following CABG indicated a less than 1% burden of A-fib, 3% burden of PVCs/NSVT.  Given A-fib outside the postoperative setting his Plavix was stopped and he was started on apixaban.  Patient was again hospitalized from 08/12/2020 through 08/18/2020 with GI bleeding.  He required 6 units of PRBCs total.  Colonoscopy revealed severe diverticulosis but no active bleeding.  EGD with nonbleeding gastric ulcers.   Patient's H&H remained stable with conservative management with PPI, he resumed his apixaban and tolerated well.  He was evaluated by Dr. Lalla Brothers for consideration of watchman however, prewatchman implant CT scan showed very difficult anatomy with a very shallow landing zone for the Watchman device.  After three-dimensional reconstruction of the appendage he was deemed a poor watchman candidate.  In 09/2021 he presented to [facility] after a witnessed cardiac arrest.  Initial rhythm was reportedly V-fib.  He had 1 round of CPR with 1 shock with successful ROSC.  On 09/27/2021 he went for cath and underwent DES to his D1.  Bedside ultrasound demonstrated severely reduced EF to 10% and he was subsequently taken for Impella placement.  Course was further complicated by cardiogenic shock, renal failure requiring HD, embolus stroke and anemia from blood loss at Impella site requiring multiple transfusions.  He was maintained on apixaban, ASA and Brilinta.  Patient was seen in clinic on 12/18/2021, he remained stable from a cardiac standpoint.  His renal function had significantly improved with creatinine decreasing from 3.2-1.34.  [name] was added at office visit in 12/2021.  Echocardiogram in May 2024 with LVEF 50 to 55%, mild RV systolic dysfunction.    Patient was last seen in clinic on 01/24/2023 by [name] to establish care.  He was previously followed by Dr. Shari Prows.  At office visit patient had remained stable from a cardiac standpoint.  On 04/04/2023 patient presented the ED for worsening chest pain.  He reported that while going  up and down stairs he started to have more intense pain, had recently been a 6 out of 10 but that day had become a 10 out of 10.  He felt a different sensation where he was squeezing then had significant discomfort with more pressure.  He took sublingual nitroglycerin at home with resolution of pain.  However the sensation came back with more of a sensation of  pressure.  In the ED troponin was negative x 2.  Echocardiogram on 04/05/2023 indicated LVEF of 50 to 55%, diastolic parameters were consistent with grade 1 diastolic dysfunction, there were regional wall motion abnormalities, mid anteroseptal segment was akinetic, basal anteroseptal segment, apical septal segment and apex were hypokinetic.  The entire anterior wall, anterolateral wall, entire inferior wall, mid inferior septal segment and basal inferior septum were normal.  Left heart cath on 04/05/2023, this indicated severe native vessel disease of the left coronary artery, 100% LCx and LAD with patent D1 stent however the downstream D1 was very small in caliber but without clear or obvious lesion distally.  Native RCA was diffusely ectatic with moderate diffuse irregularities but no focal lesions, there was diffuse calcification.  The PDA was a relatively large caliber vessel that appeared to be occluded in the midportion and distal portion filled via a very small caliber SVG to PDA that was widely patent.  Sequential left radial to OM2-OM3 is widely patent however OM 3 itself was small caliber with diffuse disease proximally.  Medical management was recommended.   Today he reports that he is doing much better.  He reports that he will occasionally have some slight discomfort when he has straining such as when he is trying to left heavy objects or when he becomes extremely stressed.  When he stops and takes a breath his discomfort quickly resolves.  He denies requiring any further sublingual nitroglycerin.  Patient notes that he has not yet taken his blood pressure medications this morning, patient reports that he is compliant with medications, monitors blood pressure at home which averages less than 118/80.  Patient denies any shortness of breath, lower extremity edema, orthopnea or PND, palpitations, presyncope or syncope. ROS: .   Today he denies shortness of breath, lower extremity edema, fatigue,  palpitations, melena, hematuria, hemoptysis, diaphoresis, weakness, presyncope, syncope, orthopnea, and PND.  All other systems are reviewed and otherwise negative. Studies Reviewed: Marland Kitchen   EKG:  EKG is not ordered today.  CV Studies: Cardiac studies reviewed are outlined and summarized above. Otherwise please see EMR for full report. Cardiac Studies & Procedures   ______________________________________________________________________________________________ CARDIAC CATHETERIZATION  CARDIAC CATHETERIZATION 04/05/2023  Narrative   NATIVE CORONARIES   Mid LM to Ost LAD lesion is 40% stenosed.   Prox LAD to Mid LAD lesion is 70% stenosed. Mid LAD to Dist LAD lesion is 100% stenosed. CTO   1st Diag-previously placed stent is 5% stenosed.  1st Diag-2 lesion is 80% stenosed with 50% stenosed side branch in Lat 1st Diag.  1st Diag-3 lesion is 100% stenosed after sidebranch. CTO   Ost Cx to Dist Cx lesion is 100% stenosed. CTO   Prox RCA lesion is 25% stenosed. Dist RCA lesion is 50% stenosed. Notable improvement from Pre-CABG Cath.  mid-distal RPDA lesion is 100% stenosed.   Small caliber RPAV is diffusely diseased, 2nd RPL lesion is 99% stenosed, and 3rd RPL lesion is 99% stenosed.   GRAFTS   LIMA-LAD  graft was visualized by angiography and is normal in caliber. The graft exhibits no  disease. The flow is not reversed. There is no competitive flow   Sequential Left Radial graft-2nd Mrg-3rd Mrgwas visualized by angiography and is large. The graft exhibits no disease. The flow is not reversed. There is no competitive flow. At distal anastomosis,  3rd Mrg lesion is 80% stenosed with 75% stenosed side branch in Lat 3rd Mrg.   SVG-RPDA graft was visualized by angiography and is small. The graft exhibits no disease, just tapers from her larger caliber ostium to a small caliber vessel all the way through to the anastomosis.. The flow is not reversed. There is no competitive flow   CABG report indicates another  SVG-RPL, however this was not found and the PL branches are very small   HEMODYNAMICS   There is no aortic valve stenosis.   In the absence of any other complications or medical issues, we expect the patient to be ready for discharge from a cath perspective on 04/06/2023.   Recommend to resume Apixaban, at currently prescribed dose and frequency on 04/06/2023.   Recommend concurrent antiplatelet therapy of Aspirin 81 mg daily.   FINDINGS SUMMARY Severe native vessel disease of the left coronary artery: 100% LCx and LAD with patent D1 stent however the downstream D1 is very small in caliber -> but with no clear or obvious lesion distally. Native RCA is diffusely ectatic with moderate diffuse irregularities but no focal lesions. There is diffuse calcification. The PDA is a relatively large caliber vessel that appears to be occluded in the midportion and the distal portion is filled via a very small caliber SVG to PDA that is widely patent. The right posterior groove AV branch is relatively small in caliber and gives off 1 tiny and 2 small caliber posterolateral branches. (The CABG report indicated a vein graft to a PL branch however I was unable to find a graft, and it was not mentioned in his catheterization at [facility]-the target would be very small and therefore it is highly likely that it was flush occluded. Unfortunately with no graft markers.) Sequential Left Radial-OM2-OM3 is widely patent, however OM 3 itself is small caliber with diffuse disease proximally. There is retrograde filling up the marginal to the small posterolateral branches. Normal LVEDP   RECOMMENDATIONS Potential culprits are the now occluded downstream 1st Diag (however this is not an acute finding based on negative troponins) as well as the downstream 3rd Mrg lesions (which are too small to intervene, especially on through a sequential graft). Best option is medical management. In the absence of any other  complications or medical issues, we expect the patient to be ready for discharge from a cath perspective on 04/06/2023. Recommend to resume Apixaban, at currently prescribed dose and frequency on 04/06/2023. Recommend concurrent antiplatelet therapy of Aspirin 81 mg daily. Recommend titration of antianginal medications.     Bryan Lemma, MD  Findings Coronary Findings Diagnostic  Dominance: Right  Left Main Vessel is moderate in size. Mid LM to Ost LAD lesion is 40% stenosed.  Left Anterior Descending Vessel is moderate in size. Prox LAD to Mid LAD lesion is 70% stenosed. Mid LAD to Dist LAD lesion is 100% stenosed. The lesion is chronically occluded.  First Diagonal Branch Vessel is moderate in size. 1st Diag-1 lesion is 5% stenosed. The lesion was previously treated using a drug eluting stent over 2 years ago. 1st Diag-2 lesion is 80% stenosed with 50% stenosed side branch in Lat 1st Diag. 1st Diag-3 lesion is 100% stenosed. The lesion is chronically occluded.  Lateral First Diagonal Branch Vessel is small in size.  Left Circumflex Vessel is large. Ost Cx to Dist Cx lesion is 100% stenosed. The lesion is chronically occluded.  First Obtuse Marginal Branch Vessel is small in size. There is moderate disease in the vessel. Collaterals 1st Mrg filled by collaterals from 2nd Mrg.  Second Obtuse Marginal Branch  Third Obtuse Marginal Branch There is mild disease in the vessel. 3rd Mrg lesion is 80% stenosed with 75% stenosed side branch in Lat 3rd Mrg.  Lateral Third Obtuse Marginal Branch There is moderate disease in the vessel.  Left Posterior Atrioventricular Artery Vessel is small in size.  Right Coronary Artery Vessel is large. There is moderate focal disease in the vessel. The vessel is moderately ectatic. Prox RCA lesion is 25% stenosed. Dist RCA lesion is 50% stenosed.  Right Posterior Descending Artery RPDA lesion is 100% stenosed.  Right Posterior  Atrioventricular Artery Vessel is small in size. There is severe disease in the vessel.  Second Right Posterolateral Branch Vessel is small in size. 2nd RPL lesion is 99% stenosed.  Third Right Posterolateral Branch 3rd RPL lesion is 99% stenosed.  LIMA LIMA Graft To Dist LAD LIMA graft was visualized by angiography and is normal in caliber.  The graft exhibits no disease. The flow is not reversed. There is no competitive flow  Sequential Left Radial Artery Graft To 2nd Mrg, 3rd Mrg Left radial artery graft was visualized by angiography and is large.  Sequential L Rad - OM2-OM3  Saphenous Graft To RPDA SVG graft was visualized by angiography and is small.  The graft exhibits no disease.  Intervention  No interventions have been documented.   CARDIAC CATHETERIZATION  CARDIAC CATHETERIZATION 03/04/2020  Narrative  Prox RCA lesion is 70% stenosed.  Dist RCA lesion is 50% stenosed.  RPDA lesion is 70% stenosed.  3rd RPL lesion is 99% stenosed.  2nd RPL lesion is 99% stenosed.  Mid LAD lesion is 90% stenosed.  Ost LAD to Prox LAD lesion is 70% stenosed.  Ost Cx to Prox Cx lesion is 100% stenosed.  Mid LM to Ost LAD lesion is 60% stenosed.  1. Severe proximal LAD stenosis. Severe mid LAD stenosis. 2. Complete occlusion of the proximal Circumflex with filling by right to left and left to left collaterals. 3. The RCA is a large dominant vessel with severe proximal stenosis, moderate distal stenosis. Severe stenosis in the posterolateral artery and the PDA. 4. LVEDP 14  Recommendations: He has severe three vessel CAD. Will keep at [facility] and ask CT surgery to consult for CABG.  Findings Coronary Findings Diagnostic  Dominance: Right  Left Main Mid LM to Ost LAD lesion is 60% stenosed.  Left Anterior Descending Vessel is large. Ost LAD to Prox LAD lesion is 70% stenosed. Mid LAD lesion is 90% stenosed.  Left Circumflex Ost Cx to Prox Cx lesion is 100% stenosed.  The lesion is chronically occluded.  Second Obtuse Marginal Branch Collaterals 2nd Mrg filled by collaterals from 2nd Sept.  Third Obtuse Marginal Branch Collaterals 3rd Mrg filled by collaterals from 3rd RPL.  Right Coronary Artery Vessel is large. Prox RCA lesion is 70% stenosed. Dist RCA lesion is 50% stenosed.  Right Posterior Descending Artery RPDA lesion is 70% stenosed.  Second Right Posterolateral Branch 2nd RPL lesion is 99% stenosed.  Third Right Posterolateral Branch 3rd RPL lesion is 99% stenosed.  Intervention  No interventions have been documented.     ECHOCARDIOGRAM  ECHOCARDIOGRAM COMPLETE BUBBLE STUDY  04/05/2023  Narrative ECHOCARDIOGRAM REPORT    Patient Name:   Shaden Lacher [facility]. Date of Exam: 04/05/2023 Medical Rec #:  [id number]                Height:       68.0 in Accession #:    [id number]               Weight:       184.0 lb Date of Birth:  1960-04-23                 BSA:          1.973 m Patient Age:    63 years                 BP:           127/83 mmHg Patient Gender: M                        HR:           68 bpm. Exam Location:  Inpatient  Procedure: 2D Echo, Color Doppler, Cardiac Doppler, Saline Contrast Bubble Study and Intracardiac Opacification Agent (Both Spectral and Color Flow Doppler were utilized during procedure).  Indications:    R07.9* Chest pain, unspecified  History:        Patient has prior history of Echocardiogram examinations, most recent 06/25/2022. CAD, Prior CABG, Arrythmias:Atrial Fibrillation; Risk Factors:Hypertension, Diabetes and Dyslipidemia.  Sonographer:    Irving Burton Senior RDCS Referring Phys: [id number] KARDIE TOBB  IMPRESSIONS   1. Left ventricular ejection fraction, by estimation, is 50 to 55%. Left ventricular ejection fraction by PLAX is 59 %. The left ventricle has low normal function. The left ventricle demonstrates regional wall motion abnormalities (see scoring diagram/findings for  description). Left ventricular diastolic parameters are consistent with Grade I diastolic dysfunction (impaired relaxation). 2. Right ventricular systolic function is low normal. The right ventricular size is normal. There is normal pulmonary artery systolic pressure. The estimated right ventricular systolic pressure is 12.9 mmHg. 3. The mitral valve is normal in structure. Trivial mitral valve regurgitation. No evidence of mitral stenosis. 4. The aortic valve is tricuspid. There is mild calcification of the aortic valve. Aortic valve regurgitation is trivial. Aortic valve sclerosis is present, with no evidence of aortic valve stenosis. 5. The inferior vena cava is normal in size with greater than 50% respiratory variability, suggesting right atrial pressure of 3 mmHg. 6. Agitated saline contrast bubble study was negative, with no evidence of any interatrial shunt.  FINDINGS Left Ventricle: Left ventricular ejection fraction, by estimation, is 50 to 55%. Left ventricular ejection fraction by PLAX is 59 %. The left ventricle has low normal function. The left ventricle demonstrates regional wall motion abnormalities. Definity contrast agent was given IV to delineate the left ventricular endocardial borders. Strain imaging was not performed. The left ventricular internal cavity size was normal in size. There is no left ventricular hypertrophy. Left ventricular diastolic parameters are consistent with Grade I diastolic dysfunction (impaired relaxation). Indeterminate filling pressures.   LV Wall Scoring: The mid anteroseptal segment is akinetic. The basal anteroseptal segment, apical septal segment, and apex are hypokinetic. The entire anterior wall, entire lateral wall, entire inferior wall, mid inferoseptal segment, and basal inferoseptal segment are normal.  Right Ventricle: The right ventricular size is normal. No increase in right ventricular wall thickness. Right ventricular systolic function is  low normal. There is normal pulmonary artery systolic pressure.  The tricuspid regurgitant velocity is 1.57 m/s, and with an assumed right atrial pressure of 3 mmHg, the estimated right ventricular systolic pressure is 12.9 mmHg.  Left Atrium: Left atrial size was normal in size.  Right Atrium: Right atrial size was normal in size.  Pericardium: There is no evidence of pericardial effusion.  Mitral Valve: The mitral valve is normal in structure. Trivial mitral valve regurgitation. No evidence of mitral valve stenosis.  Tricuspid Valve: The tricuspid valve is normal in structure. Tricuspid valve regurgitation is trivial. No evidence of tricuspid stenosis.  Aortic Valve: The aortic valve is tricuspid. There is mild calcification of the aortic valve. Aortic valve regurgitation is trivial. Aortic valve sclerosis is present, with no evidence of aortic valve stenosis.  Pulmonic Valve: The pulmonic valve was normal in structure. Pulmonic valve regurgitation is trivial. No evidence of pulmonic stenosis.  Aorta: The ascending aorta was not well visualized and the aortic root is normal in size and structure.  Venous: The inferior vena cava is normal in size with greater than 50% respiratory variability, suggesting right atrial pressure of 3 mmHg.  IAS/Shunts: No atrial level shunt detected by color flow Doppler. Agitated saline contrast was given intravenously to evaluate for intracardiac shunting. Agitated saline contrast bubble study was negative, with no evidence of any interatrial shunt.  Additional Comments: 3D imaging was not performed.   LEFT VENTRICLE PLAX 2D LV EF:         Left            Diastology ventricular     LV e' medial:    5.91 cm/s ejection        LV E/e' medial:  9.5 fraction by     LV e' lateral:   9.95 cm/s PLAX is 59      LV E/e' lateral: 5.7 %. LVIDd:         4.20 cm LVIDs:         2.90 cm LV PW:         0.80 cm LV IVS:        0.70 cm LVOT diam:     2.10 cm LV SV:          59 LV SV Index:   30 LVOT Area:     3.46 cm   RIGHT VENTRICLE RV S prime:     9.17 cm/s TAPSE (M-mode): 1.0 cm  LEFT ATRIUM             Index        RIGHT ATRIUM          Index LA diam:        3.10 cm 1.57 cm/m   RA Area:     9.47 cm LA Vol (A2C):   56.4 ml 28.59 ml/m  RA Volume:   14.50 ml 7.35 ml/m LA Vol (A4C):   30.0 ml 15.21 ml/m LA Biplane Vol: 45.1 ml 22.86 ml/m AORTIC VALVE LVOT Vmax:   87.90 cm/s LVOT Vmean:  58.500 cm/s LVOT VTI:    0.170 m  AORTA Ao Root diam: 2.90 cm  MITRAL VALVE               TRICUSPID VALVE MV Area (PHT): 2.55 cm    TR Peak grad:   9.9 mmHg MV Decel Time: 298 msec    TR Vmax:        157.00 cm/s MV E velocity: 56.30 cm/s MV A velocity: 78.50 cm/s  SHUNTS MV E/A ratio:  0.72  Systemic VTI:  0.17 m Systemic Diam: 2.10 cm  Chilton Si MD Electronically signed by Chilton Si MD Signature Date/Time: 04/05/2023/12:57:56 PM    Final   TEE  ECHO INTRAOPERATIVE TEE 03/08/2020  Narrative *INTRAOPERATIVE TRANSESOPHAGEAL REPORT *    Patient Name:   JASH WAHLEN Date of Exam: 03/08/2020 Medical Rec #:  [id number]      Height:       68.0 in Accession #:    [id number]     Weight:       178.5 lb Date of Birth:  04-27-1960       BSA:          1.95 m Patient Age:    59 years       BP:           117/78 mmHg Patient Gender: M              HR:           64 bpm. Exam Location:  Anesthesiology  Transesophogeal exam was perform intraoperatively during surgical procedure. Patient was closely monitored under general anesthesia during the entirety of examination.  Indications:     Coronary artery disease Sonographer:     Renella Cunas RDCS Performing Phys: [id number] [facility] Z ATKINS Diagnosing Phys: Eilene Ghazi MD  Complications: No known complications during this procedure. POST-OP IMPRESSIONS - Left Ventricle: The left ventricle is unchanged from pre-bypass. - Right Ventricle: The right ventricle appears unchanged from  pre-bypass. - Aorta: The aorta appears unchanged from pre-bypass. - Left Atrium: The left atrium appears unchanged from pre-bypass. - Left Atrial Appendage: The left atrial appendage appears unchanged from pre-bypass. - Aortic Valve: The aortic valve appears unchanged from pre-bypass. - Mitral Valve: The mitral valve appears unchanged from pre-bypass. - Tricuspid Valve: The tricuspid valve appears unchanged from pre-bypass. - Interatrial Septum: The interatrial septum appears unchanged from pre-bypass. - Interventricular Septum: The interventricular septum appears unchanged from pre-bypass. - Pericardium: The pericardium appears unchanged from pre-bypass. - Comments: anterior wall moving well post bypass apical segment which was hypokinetic appears to have better contractility now.  PRE-OP FINDINGS Left Ventricle: The left ventricle has low normal systolic function, with an ejection fraction of 50-55%. The cavity size was normal. There is no increase in left ventricular wall thickness.  Right Ventricle: The right ventricle has normal systolic function. The cavity was normal. There is no increase in right ventricular wall thickness. Right ventricular systolic pressure is normal.  Left Atrium: Left atrial size was normal in size. The left atrial appendage is well visualized and there is no evidence of thrombus present.  Right Atrium: Right atrial size was normal in size.  Interatrial Septum: No atrial level shunt detected by color flow Doppler.  Pericardium: There is no evidence of pericardial effusion.  Mitral Valve: The mitral valve is normal in structure. Mild thickening of the mitral valve leaflet. Mitral valve regurgitation is mild by color flow Doppler. The MR jet is centrally-directed. There is No evidence of mitral stenosis.  Tricuspid Valve: The tricuspid valve was normal in structure. Tricuspid valve regurgitation was not visualized by color flow Doppler.  Aortic Valve: The  aortic valve is normal in structure. There is moderate thickening of the aortic valve and There is moderate calcification of the aortic valve Aortic valve regurgitation is trivial by color flow Doppler. There is no evidence of aortic valve stenosis.  Pulmonic Valve: The pulmonic valve was normal in structure. Pulmonic valve regurgitation is not visualized by color  flow Doppler.   Aorta: The aortic root and ascending aorta are normal in size and structure.  +-------------+--------++ AORTIC VALVE          +-------------+--------++ AV Mean Grad:4.0 mmHg +-------------+--------++   Eilene Ghazi MD Electronically signed by Eilene Ghazi MD Signature Date/Time: 03/08/2020/4:26:01 PM    Final  MONITORS  CARDIAC EVENT MONITOR 03/28/2020  Narrative  Patient monitoring period was from 03/28/20-04/26/20  Overall burden of Afib was <1% (1 hour and 31 minutes in total). HR ranged in Afib from 93-157bpm  Predominant rhythm was NSR with average HR 93bpm (range from 65bpm to 171bpm)  Occasional ventricular ectopy (both PVCs and NSVT) with 3% burden; no sustained VT  Rare SVE <1%  No signficant pauses or sustained ventricular arrhythmias  Laurance Flatten, MD       ______________________________________________________________________________________________       Current Reported Medications:.    Current Meds  Medication Sig   allopurinol (ZYLOPRIM) 100 MG tablet Take 1 tablet (100 mg total) by mouth daily.   apixaban (ELIQUIS) 5 MG TABS tablet Take 1 tablet (5 mg total) by mouth 2 (two) times daily.   ASPIRIN LOW DOSE 81 MG EC tablet Take 81 mg by mouth every 6 (six) hours as needed for moderate pain.   Blood Pressure Monitoring (BLOOD PRESSURE DIGITAL SOLN) KIT    carvedilol (COREG) 12.5 MG tablet Take 1 tablet (12.5 mg total) by mouth 2 (two) times daily.   Continuous Glucose Receiver (FREESTYLE LIBRE 3 READER) DEVI Use to monitor blood sugar as directed   Continuous  Glucose Sensor (FREESTYLE LIBRE 3 SENSOR) MISC Place 1 sensor on the skin every 14 days. Use to check glucose continuously   empagliflozin (JARDIANCE) 10 MG TABS tablet Take 1 tablet (10 mg total) by mouth daily before breakfast.   ezetimibe (ZETIA) 10 MG tablet Take 1 tablet (10 mg total) by mouth daily.   Fe Bisgly-Vit C-Vit B12-FA (GENTLE IRON) 28-60-0.008-0.4 MG CAPS Take 1 tablet by mouth in the morning.   gabapentin (NEURONTIN) 100 MG capsule Take 1 tablet by mouth at bedtime.   isosorbide mononitrate (IMDUR) 30 MG 24 hr tablet Take 1 tablet (30 mg total) by mouth daily.   Magnesium 300 MG CAPS Take 300 mg by mouth daily.   Multiple Vitamin (MULTIVITAMIN WITH MINERALS) TABS tablet Take 1 tablet by mouth daily.   nitroGLYCERIN (NITROSTAT) 0.4 MG SL tablet Place 1 tablet (0.4 mg total) under the tongue every 5 (five) minutes as needed for chest pain (not more than 3 doses).   sacubitril-valsartan (ENTRESTO) 24-26 MG Take 1 tablet by mouth 2 (two) times daily.   Semaglutide (RYBELSUS) 14 MG TABS Take 1 tablet (14 mg total) by mouth daily.   spironolactone (ALDACTONE) 25 MG tablet Take 1/2 tablet (12.5 mg total) by mouth daily.   vitamin B-12 (CYANOCOBALAMIN) 1000 MCG tablet Take 1,000 mcg by mouth in the morning.   [DISCONTINUED] Evolocumab (REPATHA SURECLICK) 140 MG/ML SOAJ Inject 140 mg into the skin every 14 (fourteen) days.   Physical Exam:    VS:  BP 136/82 (BP Location: Left Arm, Patient Position: Sitting, Cuff Size: Normal)   Pulse 75   Ht 5\' 8"  (1.727 m)   Wt 188 lb (85.3 kg)   SpO2 95%   BMI 28.59 kg/m    Wt Readings from Last 3 Encounters:  05/13/23 188 lb (85.3 kg)  04/17/23 184 lb 9.6 oz (83.7 kg)  04/06/23 182 lb 12.8 oz (82.9 kg)    GEN:  Well nourished, well developed in no acute distress NECK: No JVD; No carotid bruits CARDIAC: RRR, no murmurs, rubs, gallops, right femoral cath site clean and intact without evidence of hematoma. RESPIRATORY:  Clear to auscultation  without rales, wheezing or rhonchi  ABDOMEN: Soft, non-tender, non-distended EXTREMITIES:  No edema; No acute deformity     Asessement and Plan:.    CAD: s/p CABG x5 with LIMA to distal LAD, SVG to PDA, SVG to posterior lateral branch of the right coronary artery, left radial artery to the ramus intermediate and first obtuse marginal coronary arteries.  Patient with witnessed cardiac arrest in 09/2021, presented to [facility] on 09/27/2021 he went for cath and underwent DES to his D1. On 04/05/23 patient underwent LHC for concern for unstable angina. Please see full report above, medication management recommended. Today patient reports that he is doing well, patient has a very slight chest discomfort when lifting heavy objects or when he becomes extremely stressed.  He notes that this quickly resolves when he stops and takes a deep breath, he has not needed any further sublingual nitroglycerin.  He has been working with a physical therapist and doing calisthenics, denies any chest discomfort with this.  Patient deferred any medication changes at this time.  He will continue to monitor for symptoms and notify the office if episodes of chest discomfort become more frequent.  Reviewed ED precautions and use of sublingual nitroglycerin.  Continue Eliquis 5 mg twice daily, aspirin 81 mg daily, carvedilol 12.5 mg twice daily, Jardiance 10 mg daily, Repatha, Zetia 10 mg daily, Imdur 30 mg daily, Entresto 24-26 mg twice daily and spironolactone 12.5 mg daily. Check CBC, CMET and fasting lipid profile.   Ischemic cardiomyopathy: Following cardiac arrest in 09/2021 EF was less than 10%, had Impella placement while at [facility].  Echo in May 2024 indicated LVEF 50%.  Echo on 04/05/2023 indicated LVEF of 50 to 55%, G1 DD, RV systolic function was low normal, normal pulmonary artery systolic pressures.  Patient denies increased shortness of breath, lower extremity edema, orthopnea or PND.  Reviewed ED precautions. Continue  Entresto, Aldactone, beta-blocker and Farxiga.   Hyperlipidemia: Last lipid profile indicated total cholesterol 88, HDL 31, triglycerides 175 and LDL 28.  Continue Repatha and ezetimibe.  Refill of Repatha provided.  Check fasting lipid profile and CMET.  Hypertension: Blood pressure today 136/82.  Patient reports that he has not yet taken his blood pressure medications this morning.  He reports at home that his blood pressure averages less than 118/80.  Patient will notify office if consistently elevated above 130/80. Continue current antihypertensive regimen.   PAF: Rate and rhythm regular on auscultation. Continue Coreg 12.5 mg BID. Continue Eliquis 5 mg twice daily and carvedilol 12.5 mg twice daily. Patient denies any bleeding problems on Eliquis.   Type 2 DM: Las hemoglobin A1C was 8.4% On Jardiance and Rybelsus.    Disposition: F/u with [name] or Reather Littler, NP in 3 months or sooner if needed.   Signed, Rip Harbour, NP

## 2023-05-13 ENCOUNTER — Encounter: Payer: Self-pay | Admitting: Cardiology

## 2023-05-13 ENCOUNTER — Ambulatory Visit: Payer: PRIVATE HEALTH INSURANCE | Attending: Cardiology | Admitting: Cardiology

## 2023-05-13 VITALS — BP 136/82 | HR 75 | Ht 68.0 in | Wt 188.0 lb

## 2023-05-13 DIAGNOSIS — Z951 Presence of aortocoronary bypass graft: Secondary | ICD-10-CM | POA: Diagnosis present

## 2023-05-13 DIAGNOSIS — I10 Essential (primary) hypertension: Secondary | ICD-10-CM | POA: Insufficient documentation

## 2023-05-13 DIAGNOSIS — E782 Mixed hyperlipidemia: Secondary | ICD-10-CM | POA: Insufficient documentation

## 2023-05-13 DIAGNOSIS — I2510 Atherosclerotic heart disease of native coronary artery without angina pectoris: Secondary | ICD-10-CM | POA: Diagnosis not present

## 2023-05-13 DIAGNOSIS — I255 Ischemic cardiomyopathy: Secondary | ICD-10-CM | POA: Diagnosis present

## 2023-05-13 DIAGNOSIS — Z79899 Other long term (current) drug therapy: Secondary | ICD-10-CM | POA: Diagnosis present

## 2023-05-13 DIAGNOSIS — I480 Paroxysmal atrial fibrillation: Secondary | ICD-10-CM | POA: Diagnosis present

## 2023-05-13 MED ORDER — REPATHA SURECLICK 140 MG/ML ~~LOC~~ SOAJ: 1.0000 mL | SUBCUTANEOUS | 11 refills | Status: AC

## 2023-05-13 NOTE — Patient Instructions (Addendum)
 Medication Instructions:  No changes *If you need a refill on your cardiac medications before your next appointment, please call your pharmacy*  Lab Work: When you go to see your PMD we will have them draw a fasting CBC, Cmet, Lipid panel and LFT If you have labs (blood work) drawn today and your tests are completely normal, you will receive your results only by: MyChart Message (if you have MyChart) OR A paper copy in the mail If you have any lab test that is abnormal or we need to change your treatment, we will call you to review the results.  Testing/Procedures: No testing  Follow-Up: At [facility], you and your health needs are our priority.  As part of our continuing mission to provide you with exceptional heart care, we have created designated Provider Care Teams.  These Care Teams include your primary Cardiologist (physician) and Advanced Practice Providers (APPs -  Physician Assistants and Nurse Practitioners) who all work together to provide you with the care you need, when you need it.  We recommend signing up for the patient portal called "MyChart".  Sign up information is provided on this After Visit Summary.  MyChart is used to connect with patients for Virtual Visits (Telemedicine).  Patients are able to view lab/test results, encounter notes, upcoming appointments, etc.  Non-urgent messages can be sent to your provider as well.   To learn more about what you can do with MyChart, go to [email or web address].    Your next appointment:   3-4 month(s)  Provider:   Verne Carrow, MD    Other Instructions

## 2023-05-21 ENCOUNTER — Encounter: Payer: Self-pay | Admitting: Family

## 2023-05-28 NOTE — Progress Notes (Unsigned)
 [facility] PRIMARY [facility]ARE [facility] [address] [place] [address] Dept: [phone] Dept Fax: [phone]    Subjective:   Colton Sprout Pelster Jr. 1960-05-25 05/29/2023  No chief complaint on file.   HPI: Colton Guzman. presents today for re-assessment and management of chronic medical conditions.  Discussed the use of AI scribe software for clinical note transcription with the patient, who gave verbal consent to proceed.  History of Present Illness        The following portions of the patient's history were reviewed and updated as appropriate: past medical history, past surgical history, family history, social history, allergies, medications, and problem list.   Patient Active Problem List   Diagnosis Date Noted   Chest pain 04/04/2023   History of CVA (cerebrovascular accident) 04/04/2023   Retinopathy of right eye 11/07/2022   Gastroesophageal reflux disease with esophagitis without hemorrhage 07/31/2022   Coronary artery disease involving native coronary artery of native heart without angina pectoris 05/19/2022   Diabetic polyneuropathy associated with type 2 diabetes mellitus (HCC) 05/19/2022   Chronic renal disease, stage 4, severely decreased glomerular filtration rate (GFR) between 15-29 mL/min/1.73 square meter (HCC) 11/14/2021   Insulin-requiring or dependent type II diabetes mellitus (HCC) 03/28/2021   Iron deficiency anemia due to chronic blood loss 08/26/2020   Paroxysmal atrial fibrillation (HCC) 08/12/2020   Sinus tachycardia    Vitamin B12 deficiency anemia due to intrinsic factor deficiency 05/19/2020   Primary osteoarthritis involving multiple joints 05/19/2020   Hyperlipidemia associated with type 2 diabetes mellitus (HCC) 05/18/2020   Type 2 diabetes mellitus (HCC) 02/22/2020   Hypertension associated with diabetes (HCC)    Gout    CAD (coronary artery disease) of artery bypass graft 02/2020   Past  Medical History:  Diagnosis Date   Anemia    hx iron transfusion   Arthritis    left shoulder   CAD (coronary artery disease) of artery bypass graft 02/2020   Diabetes mellitus without complication (HCC)    type 2   Elevated blood uric acid level    GI bleed    History of blood transfusion 08/19/2020   Hyperlipidemia LDL goal <70    Hypertension    PAF (paroxysmal atrial fibrillation) (HCC)    Wears partial dentures    upper dent/lower partial   Past Surgical History:  Procedure Laterality Date   BIOPSY  08/17/2020   Procedure: BIOPSY;  Surgeon: Napoleon Form, MD;  Location: [facility] ENDOSCOPY;  Service: Endoscopy;;   COLONOSCOPY WITH PROPOFOL Left 02/27/2020   Procedure: COLONOSCOPY WITH PROPOFOL;  Surgeon: Kathi Der, MD;  Location: [facility] ENDOSCOPY;  Service: Gastroenterology;  Laterality: Left;   COLONOSCOPY WITH PROPOFOL N/A 08/13/2020   Procedure: COLONOSCOPY WITH PROPOFOL;  Surgeon: Iva Boop, MD;  Location: [facility] ENDOSCOPY;  Service: Endoscopy;  Laterality: N/A;   CORONARY ARTERY BYPASS GRAFT N/A 03/08/2020   Procedure: CORONARY ARTERY BYPASS GRAFTING (CABG) x 5 ON PUMP;  Surgeon: Linden Dolin, MD;  Location: [facility] OR;  Service: Open Heart Surgery;  Laterality: N/A;   ENDOVEIN HARVEST OF GREATER SAPHENOUS VEIN Right 03/08/2020   Procedure: ENDOVEIN HARVEST OF GREATER SAPHENOUS VEIN;  Surgeon: Linden Dolin, MD;  Location: [facility] OR;  Service: Open Heart Surgery;  Laterality: Right;   ESOPHAGOGASTRODUODENOSCOPY (EGD) WITH PROPOFOL N/A 02/27/2020   Procedure: ESOPHAGOGASTRODUODENOSCOPY (EGD) WITH PROPOFOL;  Surgeon: Kathi Der, MD;  Location: [facility] ENDOSCOPY;  Service: Gastroenterology;  Laterality: N/A;   ESOPHAGOGASTRODUODENOSCOPY (EGD) WITH PROPOFOL N/A 08/17/2020  Procedure: ESOPHAGOGASTRODUODENOSCOPY (EGD) WITH PROPOFOL;  Surgeon: Napoleon Form, MD;  Location: [facility] ENDOSCOPY;  Service: Endoscopy;  Laterality: N/A;   INSERTION OF MESH Left 05/09/2021   Procedure:  INSERTION OF MESH;  Surgeon: Axel Filler, MD;  Location[facility] OR;  Service: General;  Laterality: Left;   LEFT HEART CATH AND CORONARY ANGIOGRAPHY N/A 03/04/2020   Procedure: LEFT HEART CATH AND CORONARY ANGIOGRAPHY;  Surgeon: [name], MD;  Location: [facility] INVASIVE CV LAB;  Service: Cardiovascular;  Laterality: N/A;   LEFT HEART CATH AND CORS/GRAFTS ANGIOGRAPHY N/A 04/05/2023   Procedure: LEFT HEART CATH AND CORS/GRAFTS ANGIOGRAPHY;  Surgeon: Marykay Lex, MD;  Location: [facility] INVASIVE CV LAB;  Service: Cardiovascular;  Laterality: N/A;   RADIAL ARTERY HARVEST Left 03/08/2020   Procedure: RADIAL ARTERY HARVEST;  Surgeon: Linden Dolin, MD;  Location: [facility] OR;  Service: Open Heart Surgery;  Laterality: Left;   TEE WITHOUT CARDIOVERSION N/A 03/08/2020   Procedure: TRANSESOPHAGEAL ECHOCARDIOGRAM (TEE);  Surgeon: Linden Dolin, MD;  Location: [facility] OR;  Service: Open Heart Surgery;  Laterality: N/A;   XI ROBOTIC ASSISTED INGUINAL HERNIA REPAIR WITH MESH Left 05/09/2021   Procedure: XI ROBOTIC ASSISTED LEFT INGUINAL HERNIA REPAIR WITH MESH;  Surgeon: Axel Filler, MD;  Location: [facility] OR;  Service: General;  Laterality: Left;   Family History  Problem Relation Age of Onset   Stroke Mother    Migraines Mother    Hypertension Mother    Hyperlipidemia Mother    Heart failure Mother    Diabetes Mother    Hypertension Father    Hyperlipidemia Father    Heart failure Father    Diabetes Father    Asthma Father    Rheum arthritis Sister    Osteoarthritis Sister    Thyroid disease Sister    Rheum arthritis Brother    Osteoarthritis Brother    Thyroid disease Brother     Current Outpatient Medications:    allopurinol (ZYLOPRIM) 100 MG tablet, Take 1 tablet (100 mg total) by mouth daily., Disp: 90 tablet, Rfl: 3   apixaban (ELIQUIS) 5 MG TABS tablet, Take 1 tablet (5 mg total) by mouth 2 (two) times daily., Disp: 60 tablet, Rfl: 5   ASPIRIN LOW DOSE 81 MG EC tablet, Take 81 mg by  mouth every 6 (six) hours as needed for moderate pain., Disp: , Rfl:    Blood Pressure Monitoring (BLOOD PRESSURE DIGITAL SOLN) KIT, , Disp: , Rfl:    carvedilol (COREG) 12.5 MG tablet, Take 1 tablet (12.5 mg total) by mouth 2 (two) times daily., Disp: 180 tablet, Rfl: 0   Continuous Glucose Receiver (FREESTYLE LIBRE 3 READER) DEVI, Use to monitor blood sugar as directed, Disp: 1 each, Rfl: 3   Continuous Glucose Sensor (FREESTYLE LIBRE 3 SENSOR) MISC, Place 1 sensor on the skin every 14 days. Use to check glucose continuously, Disp: 2 each, Rfl: 11   empagliflozin (JARDIANCE) 10 MG TABS tablet, Take 1 tablet (10 mg total) by mouth daily before breakfast., Disp: 30 tablet, Rfl: 12   Evolocumab (REPATHA SURECLICK) 140 MG/ML SOAJ, Inject 140 mg into the skin every 14 (fourteen) days., Disp: 2 mL, Rfl: 11   ezetimibe (ZETIA) 10 MG tablet, Take 1 tablet (10 mg total) by mouth daily., Disp: 90 tablet, Rfl: 3   Fe Bisgly-Vit C-Vit B12-FA (GENTLE IRON) 28-60-0.008-0.4 MG CAPS, Take 1 tablet by mouth in the morning., Disp: , Rfl:    gabapentin (NEURONTIN) 100 MG capsule, Take 1 tablet by mouth  at bedtime., Disp: 90 capsule, Rfl: 0   isosorbide mononitrate (IMDUR) 30 MG 24 hr tablet, Take 1 tablet (30 mg total) by mouth daily., Disp: 90 tablet, Rfl: 3   Magnesium 300 MG CAPS, Take 300 mg by mouth daily., Disp: , Rfl:    Multiple Vitamin (MULTIVITAMIN WITH MINERALS) TABS tablet, Take 1 tablet by mouth daily., Disp: , Rfl:    nitroGLYCERIN (NITROSTAT) 0.4 MG SL tablet, Place 1 tablet (0.4 mg total) under the tongue every 5 (five) minutes as needed for chest pain (not more than 3 doses)., Disp: 100 tablet, Rfl: 3   sacubitril-valsartan (ENTRESTO) 24-26 MG, Take 1 tablet by mouth 2 (two) times daily., Disp: 180 tablet, Rfl: 3   Semaglutide (RYBELSUS) 14 MG TABS, Take 1 tablet (14 mg total) by mouth daily., Disp: 90 tablet, Rfl: 1   spironolactone (ALDACTONE) 25 MG tablet, Take 1/2 tablet (12.5 mg total) by mouth  daily., Disp: 45 tablet, Rfl: 3   vitamin B-12 (CYANOCOBALAMIN) 1000 MCG tablet, Take 1,000 mcg by mouth in the morning., Disp: , Rfl:  Allergies  Allergen Reactions   Atorvastatin Other (See Comments)    Significant elevation in LFTs while on statins   Crestor [Rosuvastatin] Other (See Comments)    Significant elevation in LFTs while on statins   Metformin And Related Other (See Comments)   Propofol Other (See Comments)    Encephalopathy   Insulin Palpitations    Bad reaction      ROS: A complete ROS was performed with pertinent positives/negatives noted in the HPI. The remainder of the ROS are negative.    Objective:   There were no vitals filed for this visit.  GENERAL: Well-appearing, in NAD. Well nourished.  SKIN: Pink, warm and dry. No rash, lesion, ulceration, or ecchymoses.  NECK: Trachea midline. Full ROM w/o pain or tenderness. No lymphadenopathy.  RESPIRATORY: Chest wall symmetrical. Respirations even and non-labored. Breath sounds clear to auscultation bilaterally.  CARDIAC: S1, S2 present, regular rate and rhythm. Peripheral pulses 2+ bilaterally.  MSK: Muscle tone and strength appropriate for age. Joints w/o tenderness, redness, or swelling.  EXTREMITIES: Without clubbing, cyanosis, or edema.  NEUROLOGIC: No motor or sensory deficits. Steady, even gait.  PSYCH/MENTAL STATUS: Alert, oriented x 3. Cooperative, appropriate mood and affect.   Health Maintenance Due  Topic Date Due   Medicare Annual Wellness (AWV)  Never done   Zoster Vaccines- Shingrix (2 of 2) 05/23/2021   Diabetic kidney evaluation - Urine ACR  05/15/2023    No results found for any visits on 05/29/23.  The ASCVD Risk score (Arnett DK, et al., 2019) failed to calculate for the following reasons:   Risk score cannot be calculated because patient has a medical history suggesting prior/existing ASCVD     Assessment & Plan:  Assessment and Plan Assessment & Plan      There are no diagnoses  linked to this encounter. No orders of the defined types were placed in this encounter.  No images are attached to the encounter or orders placed in the encounter. No orders of the defined types were placed in this encounter.   No follow-ups on file.   [name], FNP

## 2023-05-29 ENCOUNTER — Encounter: Payer: Self-pay | Admitting: Internal Medicine

## 2023-05-29 ENCOUNTER — Ambulatory Visit: Payer: PRIVATE HEALTH INSURANCE | Admitting: Internal Medicine

## 2023-05-29 VITALS — BP 112/79 | HR 77 | Temp 98.2°F | Resp 18 | Wt 187.4 lb

## 2023-05-29 DIAGNOSIS — J302 Other seasonal allergic rhinitis: Secondary | ICD-10-CM | POA: Diagnosis not present

## 2023-05-29 DIAGNOSIS — E1142 Type 2 diabetes mellitus with diabetic polyneuropathy: Secondary | ICD-10-CM | POA: Diagnosis not present

## 2023-05-29 DIAGNOSIS — I152 Hypertension secondary to endocrine disorders: Secondary | ICD-10-CM

## 2023-05-29 DIAGNOSIS — E782 Mixed hyperlipidemia: Secondary | ICD-10-CM

## 2023-05-29 DIAGNOSIS — E1159 Type 2 diabetes mellitus with other circulatory complications: Secondary | ICD-10-CM

## 2023-05-29 DIAGNOSIS — I2510 Atherosclerotic heart disease of native coronary artery without angina pectoris: Secondary | ICD-10-CM

## 2023-05-29 LAB — COMPREHENSIVE METABOLIC PANEL WITH GFR
ALT: 22 U/L (ref 0–53)
AST: 16 U/L (ref 0–37)
Albumin: 4.8 g/dL (ref 3.5–5.2)
Alkaline Phosphatase: 65 U/L (ref 39–117)
BUN: 25 mg/dL — ABNORMAL HIGH (ref 6–23)
CO2: 27 meq/L (ref 19–32)
Calcium: 10.2 mg/dL (ref 8.4–10.5)
Chloride: 98 meq/L (ref 96–112)
Creatinine, Ser: 1.5 mg/dL (ref 0.40–1.50)
GFR: 50.05 mL/min — ABNORMAL LOW (ref >60.00)
Glucose, Bld: 191 mg/dL — ABNORMAL HIGH (ref 70–99)
Potassium: 5.2 meq/L — ABNORMAL HIGH (ref 3.5–5.1)
Sodium: 133 meq/L — ABNORMAL LOW (ref 135–145)
Total Bilirubin: 0.5 mg/dL (ref 0.2–1.2)
Total Protein: 8.3 g/dL (ref 6.0–8.3)

## 2023-05-29 LAB — LIPID PANEL
Cholesterol: 100 mg/dL (ref 0–200)
HDL: 29.1 mg/dL — ABNORMAL LOW (ref >39.00)
LDL Cholesterol: 10 mg/dL (ref 0–99)
NonHDL: 70.96
Total CHOL/HDL Ratio: 3
Triglycerides: 303 mg/dL — ABNORMAL HIGH (ref 0.0–149.0)
VLDL: 60.6 mg/dL — ABNORMAL HIGH (ref 0.0–40.0)

## 2023-05-29 LAB — CBC WITH DIFFERENTIAL/PLATELET
Basophils Absolute: 0 10*3/uL (ref 0.0–0.1)
Basophils Relative: 0.6 % (ref 0.0–3.0)
Eosinophils Absolute: 0.3 10*3/uL (ref 0.0–0.7)
Eosinophils Relative: 3.7 % (ref 0.0–5.0)
HCT: 45.5 % (ref 39.0–52.0)
Hemoglobin: 15.5 g/dL (ref 13.0–17.0)
Lymphocytes Relative: 37.6 % (ref 12.0–46.0)
Lymphs Abs: 3 10*3/uL (ref 0.7–4.0)
MCHC: 34 g/dL (ref 30.0–36.0)
MCV: 97.3 fl (ref 78.0–100.0)
Monocytes Absolute: 0.4 10*3/uL (ref 0.1–1.0)
Monocytes Relative: 4.7 % (ref 3.0–12.0)
Neutro Abs: 4.3 10*3/uL (ref 1.4–7.7)
Neutrophils Relative %: 53.4 % (ref 43.0–77.0)
Platelets: 188 10*3/uL (ref 150.0–400.0)
RBC: 4.68 Mil/uL (ref 4.22–5.81)
RDW: 13.1 % (ref 11.5–15.5)
WBC: 8.1 10*3/uL (ref 4.0–10.5)

## 2023-05-29 LAB — HEPATIC FUNCTION PANEL
ALT: 22 U/L (ref 0–53)
AST: 16 U/L (ref 0–37)
Albumin: 4.8 g/dL (ref 3.5–5.2)
Alkaline Phosphatase: 65 U/L (ref 39–117)
Bilirubin, Direct: 0.1 mg/dL (ref 0.0–0.3)
Total Bilirubin: 0.5 mg/dL (ref 0.2–1.2)
Total Protein: 8.3 g/dL (ref 6.0–8.3)

## 2023-05-29 MED ORDER — OLOPATADINE HCL 0.2 % OP SOLN
OPHTHALMIC | 3 refills | Status: DC
Start: 2023-05-29 — End: 2023-07-29

## 2023-05-29 MED ORDER — GABAPENTIN 100 MG PO CAPS: ORAL_CAPSULE | ORAL | Status: AC

## 2023-05-29 NOTE — Addendum Note (Signed)
 Addended by: Zada Girt D on: 05/29/2023 12:31 PM   Modules accepted: Orders

## 2023-06-05 ENCOUNTER — Telehealth: Payer: Self-pay

## 2023-06-05 NOTE — Telephone Encounter (Signed)
-----   Message from Katlyn D West sent at 06/05/2023  8:03 AM EDT ----- Please let Colton Guzman know that his CBC shows no evidence of anemia or infection.  His lipid profile shows elevated triglycerides, has he been taking Vascepa?  Recommend he follow-up with [facility] lipid clinic as they have been managing this previously.  His liver function is normal. His kidney function had a slight decrease and his potassium level is mildly elevated, recommend he decrease his intake of high potassium foods such as including bananas, squash, yogurt, white beans, sweet potatoes, leafy greens, and avocados.  He should stop intake of any electrolyte beverages and hydrate with water.  Recommend repeating a BMET in 1 to 2 weeks.

## 2023-06-05 NOTE — Telephone Encounter (Signed)
 Left message to call back

## 2023-06-07 NOTE — Telephone Encounter (Signed)
-----   Message from Katlyn D West sent at 06/05/2023  8:03 AM EDT ----- Please let Colton Guzman know that his CBC shows no evidence of anemia or infection.  His lipid profile shows elevated triglycerides, has he been taking Vascepa?  Recommend he follow-up with [facility] lipid clinic as they have been managing this previously.  His liver function is normal. His kidney function had a slight decrease and his potassium level is mildly elevated, recommend he decrease his intake of high potassium foods such as including bananas, squash, yogurt, white beans, sweet potatoes, leafy greens, and avocados.  He should stop intake of any electrolyte beverages and hydrate with water.  Recommend repeating a BMET in 1 to 2 weeks.

## 2023-06-07 NOTE — Telephone Encounter (Signed)
 Left message to call back

## 2023-06-17 NOTE — Telephone Encounter (Signed)
 Left message to call back

## 2023-06-17 NOTE — Telephone Encounter (Signed)
-----   Message from Katlyn D West sent at 06/05/2023  8:03 AM EDT ----- Please let Mr. Smoker know that his CBC shows no evidence of anemia or infection.  His lipid profile shows elevated triglycerides, has he been taking Vascepa?  Recommend he follow-up with [facility] lipid clinic as they have been managing this previously.  His liver function is normal. His kidney function had a slight decrease and his potassium level is mildly elevated, recommend he decrease his intake of high potassium foods such as including bananas, squash, yogurt, white beans, sweet potatoes, leafy greens, and avocados.  He should stop intake of any electrolyte beverages and hydrate with water.  Recommend repeating a BMET in 1 to 2 weeks.

## 2023-06-18 NOTE — Telephone Encounter (Signed)
 Left message to call back

## 2023-06-25 ENCOUNTER — Encounter: Payer: Self-pay | Admitting: Family

## 2023-06-25 NOTE — Telephone Encounter (Signed)
 Left message to call back

## 2023-07-07 ENCOUNTER — Other Ambulatory Visit: Payer: Self-pay | Admitting: Cardiovascular Disease

## 2023-07-07 DIAGNOSIS — E782 Mixed hyperlipidemia: Secondary | ICD-10-CM

## 2023-07-07 DIAGNOSIS — I2510 Atherosclerotic heart disease of native coronary artery without angina pectoris: Secondary | ICD-10-CM

## 2023-07-07 DIAGNOSIS — Z79899 Other long term (current) drug therapy: Secondary | ICD-10-CM

## 2023-07-16 ENCOUNTER — Other Ambulatory Visit: Payer: Self-pay | Admitting: Internal Medicine

## 2023-07-16 DIAGNOSIS — E1142 Type 2 diabetes mellitus with diabetic polyneuropathy: Secondary | ICD-10-CM

## 2023-07-24 NOTE — Progress Notes (Signed)
 Cardiology Office Note    Patient Name: Colton Borbe Texidor Jr. Date of Encounter: 07/25/2023  Primary Care Provider:  Gavin Kast, FNP Primary Cardiologist:  Antoinette Batman, MD Primary Electrophysiologist: Boyce Byes, MD   Past Medical History    Past Medical History:  Diagnosis Date   Anemia    hx iron transfusion   Anxiety 10/08/16   Arthritis    left shoulder   CAD (coronary artery disease) of artery bypass graft 02/2020   CHF (congestive heart failure) (HCC) 09/2021   Chronic kidney disease 09/2021   Clotting disorder (HCC) 02/2020   Diabetes mellitus without complication (HCC)    type 2   Elevated blood uric acid level    GERD (gastroesophageal reflux disease) 06/2014   GI bleed    Heart murmur 04/2013   History of blood transfusion 08/19/2020   Hyperlipidemia LDL goal <70    Hypertension    Myocardial infarction (HCC) 02/2020   Neuromuscular disorder (HCC) 07/08/16   PAF (paroxysmal atrial fibrillation) (HCC)    Stroke (HCC) 09/2021   Ulcer 07/2020   Wears partial dentures    upper dent/lower partial    History of Present Illness  Colton Guzman Even Budlong. is a 63 y.o. 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  Colton Guzman was admitted on 02/22/2020 with GI bleed and hypotension and reported chest pain during bowel movement that was responsive to nitro.  He suffered subsequent VT arrest with CPR and defibrillator shock and found to have GI bleed secondary to diverticular hemorrhage.  He underwent LHC on 03/04/2020 and was found to have three-vessel disease and referred to CVTS and consulted and underwent CABG x 5 on 03/08/2020  with LIMA-LAD, SV-PDA., SVG-PLA, left radial artery-ramus intermediate and first obtuse marginal coronary arteries.  He was seen in follow-up with complaint of palpitations and wore  event monitor that showed A-fib.  He was continued on Eliquis  and admitted on 08/12/2020 with hematochezia and received 6 units of PRBC and aspirin  and Eliquis  were held.  Colonoscopy was completed and revealed severe diverticulosis with no active bleeding and EGD was completed showing nonbleeding gastric ulcer.  He was resumed on Eliquis  and was discharged in stable condition.  He was referred to Dr. Marven Slimmer for evaluation of watchman due to history of GI bleed however prewatchman CT showed difficult anatomy with shallow landing zone for device.  He was deemed a poor candidate and was advised to consider amulet in the future.  He was admitted to [facility] on 09/27/2021 after suffering a witnessed out-of-hospital cardiac arrest at the [facility] with ROSC and resolution x 1 shock with 1 round of CPR.  He underwent LHC with DES to first diagonal and was intubated with worsening hypotension requiring vasopressor titration.  He had Impella placed due to decreased EF of 10% and was extubated with AKI secondary to cardiogenic shock.  He also had a suspected embolic stroke with MRI of the brain revealing bilateral cerebral infarctions.  He suffered significant blood loss from Impella site requiring 8 units of PRBC transfusion with removal of Impella.  He was continued on Eliquis  due to recent embolic CVAs.    He was transitioned to Dr. Abel Hoe following Dr. Lorelie Rohrer relocation to [place] .  He was seen initially on 01/24/2023 and denied any chest pain or shortness of breath.  He had previously not tolerated statins due to elevated LFTs and was tolerating Zetia  and Repatha  without any difficulty.  He was in sinus rhythm on examination and no changes were made at that time.  He was admitted on 04/04/2023 with worsening chest pain.  He reported discomfort over 2 to 3-week.  That worsened with household chores that was similar to previous pain felt in 2022 prior to bypass.  He had resolution of discomfort with sublingual nitroglycerin  and  troponins were completed that were negative x 2. 2D echo was completed showing EF of 50-55% with grade 1 DD and RWMA with akinetic anterior septal and apical.  He underwent a LHC on 04/05/2023 showing patent grafts with native RCA diffusely ectatic with moderate diffuse irregularities but no focal lesions.  Medical management was recommended and patient was seen in follow-up on 05/09/2023 and reported doing much better.  He noted some discomfort with straining that resolved with rest and did not require nitroglycerin .  He was advised to monitor symptoms and contact office if discomfort increased.  Colton Guzman presents today with his wife for 50-month follow-up. He experiences discomfort during straining or heavy exertion, which subsides with rest and aspirin . He has not required nitroglycerin  recently and has experienced discomfort approximately three times since his last visit, each time associated with heavy exertion and relieved by aspirin . He is currently taking 81 mg aspirin  as needed. He has severe three-vessel disease with previous cardiac catheterization showing severe disease and grafts with some diffuse disease. He is on Eliquis  and aspirin  for antiplatelet therapy. He describes the discomfort as 'pinpoint' and 'like something's stuck' during heavy exertion, which is relieved by rest and aspirin . No discomfort occurs at rest or during light activities such as walking his dog or doing house chores. He has a history of smoking, having quit approximately 12 years ago. He reports a sensation in his legs described as 'stepping on water ' and difficulty feeling the brake and gas pedals while driving, which has led to his wife driving him. He uses a foot massager to help with calf tightness. He is currently taking Coreg  and Entresto , and he has stopped taking gabapentin  due to dizziness and drowsiness. He has a history of elevated triglycerides and is currently on Repatha  every 14 days. No chest pain at rest,  shortness of breath, or swelling. Discomfort with heavy exertion is relieved by rest and aspirin . He experiences a sensation of 'stepping on water ' in his legs and difficulty feeling the brake and gas pedals while driving.  Patient denies palpitations, dyspnea, PND, orthopnea, nausea, vomiting, dizziness, syncope, edema, weight gain, or early satiety.  Discussed the use of AI scribe software for clinical note transcription with the patient, who gave verbal consent to proceed.  History of Present Illness   Review of Systems  Please see the history of present illness.    All other systems reviewed and are otherwise negative except as noted above.  Physical Exam     Wt Readings from Last 3 Encounters:  07/25/23 184 lb 12.8 oz (83.8 kg)  05/29/23 187 lb 6.4 oz (85 kg)  05/13/23 188 lb (85.3 kg)   VS: Vitals:   07/25/23 0906  BP: 124/78  Pulse: 72  SpO2: 98%  ,Body mass index is 28.1 kg/m. GEN: Well nourished, well developed in no acute distress Neck: No JVD; No carotid bruits Pulmonary: Clear to auscultation without rales, wheezing or rhonchi  Cardiovascular: Normal rate. Regular rhythm. Normal S1. Normal S2.   Murmurs:  There is no murmur.  ABDOMEN: Soft, non-tender, non-distended EXTREMITIES:  No edema; No deformity   EKG/LABS/ Recent Cardiac Studies   ECG personally reviewed by me today -sinus rhythm with rate of 67 bpm and no acute changes with resolution of TWI in lead V3 and no septal abnormalities present  Risk Assessment/Calculations:    CHA2DS2-VASc Score = 6   This indicates a 9.7% annual risk of stroke. The patient's score is based upon: CHF History: 1 HTN History: 1 Diabetes History: 1 Stroke History: 2 Vascular Disease History: 1 Age Score: 0 Gender Score: 0         Lab Results  Component Value Date   WBC 8.1 05/29/2023   HGB 15.5 05/29/2023   HCT 45.5 05/29/2023   MCV 97.3 05/29/2023   PLT 188.0 05/29/2023   Lab Results  Component Value Date    CREATININE 1.50 05/29/2023   BUN 25 (H) 05/29/2023   NA 133 (L) 05/29/2023   K 5.2 No hemolysis seen (H) 05/29/2023   CL 98 05/29/2023   CO2 27 05/29/2023   Lab Results  Component Value Date   CHOL 100 05/29/2023   HDL 29.10 (L) 05/29/2023   LDLCALC 10 05/29/2023   LDLDIRECT 93.0 07/26/2020   TRIG 303.0 (H) 05/29/2023   CHOLHDL 3 05/29/2023    Lab Results  Component Value Date   HGBA1C 8.4 (A) 04/17/2023   Assessment & Plan    Assessment & Plan   1.  History of CAD: -Significant cardiac past medical history with prior VT arrest requiring CPR and NSTEMI with CAD s/p CABG x 5 03/08/2020 and witnessed cardiac arrest in 09/2021, with LHC and PCI to D1 -intermittent chest discomfort with exertion, relieved by rest and aspirin , indicating stable angina. Recent catheterization showed severe three vessel disease with some grafts having diffuse disease. Symptoms well-managed, travel clearance granted. -Patient is cleared to resume airplane travel - Increase isosorbide  mononitrate to 60 mg. - Consider stress test and heart catheterization if symptoms persist. - Ensure nitroglycerin  availability and strict ED precautions advised regarding unstable chest pain  2.  HFimEF/ICM: - 2D echo completed on 04/05/2023 showing EF of 50-55% - Patient is euvolemic today on examination - Continue current GDMT with Coreg  12.5 mg twice daily, Jardiance  10 mg, Imdur  60 mg, Entresto  24/26 mg twice daily and spironolactone  12.5 mg daily -Low sodium diet, fluid restriction <2L, and daily weights encouraged. Educated to contact our office for weight gain of 2 lbs overnight or 5 lbs in one week.   3.  History of CVA: - History of embolic CVA with no residual noted - Continue Eliquis  5 mg twice daily and ASA 81 mg - Continue Zetia  10 mg and Repatha  140 mg q. 14 days  4.  Essential hypertension: - Patient's blood pressure today was stable at 124/78 - Continue carvedilol  12.5 mg twice daily, Imdur  60 mg  daily, Entresto  24/26 mg twice daily and spironolactone  12.5 mg daily  5.  Paroxysmal AF: - Heart rate stable today at 72 bpm - Patient denies any tachycardia or arrhythmia since previous follow-up. - Continue Eliquis  5 mg twice daily - BMET and magnesium  today  6.  Hyperlipidemia: -Elevated triglycerides noted. Previously on Vascepa , now on Repatha  targeting LDL. Plan to follow up for triglyceride management. - Follow up with PharmD or lipid clinic for triglyceride management. - Check lipid profile.  7.  Claudication and neuropathy: -Sensation of stepping on water  and difficulty with pedals suggests nerve damage.  - Recommend  nerve conduction study with primary care provider. - Patient also reports cramping and pain with ambulation - Patient will complete ultrasound and ABI  Disposition: Follow-up with Antoinette Batman, MD or APP in 6 months    Signed, Francene Ing, Retha Cast, NP 07/25/2023, 12:08 PM [facility]

## 2023-07-25 ENCOUNTER — Encounter: Payer: Self-pay | Admitting: Nurse Practitioner

## 2023-07-25 ENCOUNTER — Ambulatory Visit: Payer: PRIVATE HEALTH INSURANCE | Attending: Nurse Practitioner | Admitting: Nurse Practitioner

## 2023-07-25 VITALS — BP 124/78 | HR 72 | Ht 68.0 in | Wt 184.8 lb

## 2023-07-25 DIAGNOSIS — I2510 Atherosclerotic heart disease of native coronary artery without angina pectoris: Secondary | ICD-10-CM | POA: Diagnosis not present

## 2023-07-25 DIAGNOSIS — R600 Localized edema: Secondary | ICD-10-CM | POA: Diagnosis present

## 2023-07-25 DIAGNOSIS — Z8673 Personal history of transient ischemic attack (TIA), and cerebral infarction without residual deficits: Secondary | ICD-10-CM | POA: Diagnosis present

## 2023-07-25 DIAGNOSIS — E119 Type 2 diabetes mellitus without complications: Secondary | ICD-10-CM | POA: Diagnosis not present

## 2023-07-25 DIAGNOSIS — I255 Ischemic cardiomyopathy: Secondary | ICD-10-CM

## 2023-07-25 DIAGNOSIS — I10 Essential (primary) hypertension: Secondary | ICD-10-CM

## 2023-07-25 DIAGNOSIS — I480 Paroxysmal atrial fibrillation: Secondary | ICD-10-CM

## 2023-07-25 DIAGNOSIS — I739 Peripheral vascular disease, unspecified: Secondary | ICD-10-CM | POA: Diagnosis present

## 2023-07-25 MED ORDER — ISOSORBIDE MONONITRATE ER 60 MG PO TB24: 60.0000 mg | ORAL_TABLET | Freq: Every day | ORAL | 3 refills | Status: AC

## 2023-07-25 NOTE — Patient Instructions (Signed)
 Medication Instructions:  Increase Imdur  to 60 mg once a day  *If you need a refill on your cardiac medications before your next appointment, please call your pharmacy*  Lab Work: Today we are going to have you get draw a Mag and Bmet If you have labs (blood work) drawn today and your tests are completely normal, you will receive your results only by: MyChart Message (if you have MyChart) OR A paper copy in the mail If you have any lab test that is abnormal or we need to change your treatment, we will call you to review the results.  Testing/Procedures: Your physician has requested that you have an ankle brachial index (ABI). During this test an ultrasound and blood pressure cuff are used to evaluate the arteries that supply the arms and legs with blood. Allow thirty minutes for this exam. There are no restrictions or special instructions.  Please note: We ask at that you not bring children with you during ultrasound (echo/ vascular) testing. Due to room size and safety concerns, children are not allowed in the ultrasound rooms during exams. Our front office staff cannot provide observation of children in our lobby area while testing is being conducted. An adult accompanying a patient to their appointment will only be allowed in the ultrasound room at the discretion of the ultrasound technician under special circumstances. We apologize for any inconvenience.  Follow-Up: At [facility], you and your health needs are our priority.  As part of our continuing mission to provide you with exceptional heart care, our providers are all part of one team.  This team includes your primary Cardiologist (physician) and Advanced Practice Providers or APPs (Physician Assistants and Nurse Practitioners) who all work together to provide you with the care you need, when you need it.  Your next appointment:   6 month(s)  Provider:   Charles Connor, NP  We recommend signing up for the patient portal  called "MyChart".  Sign up information is provided on this After Visit Summary.  MyChart is used to connect with patients for Virtual Visits (Telemedicine).  Patients are able to view lab/test results, encounter notes, upcoming appointments, etc.  Non-urgent messages can be sent to your provider as well.   To learn more about what you can do with MyChart, go to [email or web address].   Other Instructions Good to travel with no restrictions

## 2023-07-29 ENCOUNTER — Ambulatory Visit: Payer: Self-pay | Admitting: Nurse Practitioner

## 2023-07-29 ENCOUNTER — Ambulatory Visit (INDEPENDENT_AMBULATORY_CARE_PROVIDER_SITE_OTHER): Admitting: Internal Medicine

## 2023-07-29 ENCOUNTER — Encounter: Payer: Self-pay | Admitting: Internal Medicine

## 2023-07-29 VITALS — BP 116/70 | HR 80 | Temp 98.3°F | Ht 68.0 in | Wt 185.0 lb

## 2023-07-29 DIAGNOSIS — I255 Ischemic cardiomyopathy: Secondary | ICD-10-CM

## 2023-07-29 DIAGNOSIS — N184 Chronic kidney disease, stage 4 (severe): Secondary | ICD-10-CM

## 2023-07-29 DIAGNOSIS — E785 Hyperlipidemia, unspecified: Secondary | ICD-10-CM

## 2023-07-29 DIAGNOSIS — I152 Hypertension secondary to endocrine disorders: Secondary | ICD-10-CM

## 2023-07-29 DIAGNOSIS — I480 Paroxysmal atrial fibrillation: Secondary | ICD-10-CM

## 2023-07-29 DIAGNOSIS — Z7984 Long term (current) use of oral hypoglycemic drugs: Secondary | ICD-10-CM

## 2023-07-29 DIAGNOSIS — E1169 Type 2 diabetes mellitus with other specified complication: Secondary | ICD-10-CM

## 2023-07-29 DIAGNOSIS — E1142 Type 2 diabetes mellitus with diabetic polyneuropathy: Secondary | ICD-10-CM

## 2023-07-29 DIAGNOSIS — Z79899 Other long term (current) drug therapy: Secondary | ICD-10-CM

## 2023-07-29 DIAGNOSIS — E1159 Type 2 diabetes mellitus with other circulatory complications: Secondary | ICD-10-CM

## 2023-07-29 DIAGNOSIS — I2510 Atherosclerotic heart disease of native coronary artery without angina pectoris: Secondary | ICD-10-CM

## 2023-07-29 DIAGNOSIS — I5022 Chronic systolic (congestive) heart failure: Secondary | ICD-10-CM

## 2023-07-29 DIAGNOSIS — Z789 Other specified health status: Secondary | ICD-10-CM

## 2023-07-29 LAB — BASIC METABOLIC PANEL WITH GFR
BUN/Creatinine Ratio: 15 (ref 10–24)
BUN: 22 mg/dL (ref 8–27)
BUN: 22 mg/dL (ref 6–23)
CO2: 18 mmol/L — ABNORMAL LOW (ref 20–29)
CO2: 25 meq/L (ref 19–32)
Calcium: 10 mg/dL (ref 8.6–10.2)
Calcium: 9.8 mg/dL (ref 8.4–10.5)
Chloride: 103 mmol/L (ref 96–106)
Chloride: 104 meq/L (ref 96–112)
Creatinine, Ser: 1.51 mg/dL — ABNORMAL HIGH (ref 0.76–1.27)
Creatinine, Ser: 1.44 mg/dL (ref 0.40–1.50)
GFR: 52.51 mL/min — ABNORMAL LOW (ref >60.00)
Glucose, Bld: 212 mg/dL — ABNORMAL HIGH (ref 70–99)
Glucose: 190 mg/dL — ABNORMAL HIGH (ref 70–99)
Potassium: 5.4 mmol/L — ABNORMAL HIGH (ref 3.5–5.2)
Potassium: 4.4 meq/L (ref 3.5–5.1)
Sodium: 139 mmol/L (ref 134–144)
Sodium: 137 meq/L (ref 135–145)
eGFR: 52 mL/min/{1.73_m2} — ABNORMAL LOW (ref >59)

## 2023-07-29 LAB — MAGNESIUM: Magnesium: 2.3 mg/dL (ref 1.6–2.3)

## 2023-07-29 LAB — HEMOGLOBIN A1C: Hgb A1c MFr Bld: 9.2 % — ABNORMAL HIGH (ref 4.6–6.5)

## 2023-07-29 LAB — MICROALBUMIN / CREATININE URINE RATIO
Creatinine,U: 59.1 mg/dL
Microalb Creat Ratio: 17.7 mg/g (ref 0.0–30.0)
Microalb, Ur: 1 mg/dL (ref 0.0–1.9)

## 2023-07-29 MED ORDER — SACUBITRIL-VALSARTAN 24-26 MG PO TABS: 1.0000 | ORAL_TABLET | Freq: Two times a day (BID) | ORAL | 3 refills | Status: AC

## 2023-07-29 NOTE — Progress Notes (Signed)
 [facility] [address] [place] [address] Dept: [phone] Dept Fax: [phone]    Subjective:   Colton Guzman. [birth year] 07/29/2023  Chief Complaint  Patient presents with   Follow-up   Medication Refill    Gabapentin  make patient dizzy     HPI: Colton Guzman. presents today for re-assessment and management of chronic medical conditions.   Discussed the use of AI scribe software for clinical note transcription with the patient, who gave verbal consent to proceed.  History of Present Illness   Colton Guzman. is a 63 year old male with atrial fibrillation, heart failure, CKD, HLD, and type 2 diabetes who presents for a follow-up visit.  He recently visited his cardiologist for management of atrial fibrillation and heart failure. He is taking Eliquis  twice daily for atrial fibrillation and has had his isosorbide  mononitrate increased to 60 mg. He requires a refill for Entresto .  He manages type 2 diabetes with Jardiance  10 mg daily and Rybelsus . He performs regular finger stick blood glucose monitoring, with morning readings typically in the 180s, influenced by dietary intake. He is due for an A1c check today.  He experiences diabetic polyneuropathy, for which he takes gabapentin  100 mg daily. He experiences dizziness when standing up too quickly, which he attributes to the medication, but notes improvement in neuropathic symptoms such as 'sharp pins and needles'. He manages the dizziness by standing up slowly.  He has a history of hyperlipidemia, but is statin intolerant. His cholesterol levels were stable as of two months ago.      Last lipids Lab Results  Component Value Date   CHOL 100 05/29/2023   HDL 29.10 (L) 05/29/2023   LDLCALC 10 05/29/2023   LDLDIRECT 93.0 07/26/2020   TRIG 303.0 (H) 05/29/2023   CHOLHDL 3 05/29/2023   Lab Results  Component Value Date   HGBA1C 8.4  (A) 04/17/2023     The following portions of the patient's history were reviewed and updated as appropriate: past medical history, past surgical history, family history, social history, allergies, medications, and problem list.   Patient Active Problem List   Diagnosis Date Noted   Chest pain 04/04/2023   History of CVA (cerebrovascular accident) 04/04/2023   Retinopathy of right eye 11/07/2022   Gastroesophageal reflux disease with esophagitis without hemorrhage 07/31/2022   Coronary artery disease involving native coronary artery of native heart without angina pectoris 05/19/2022   Diabetic polyneuropathy associated with type 2 diabetes mellitus (HCC) 05/19/2022   Chronic renal disease, stage 4, severely decreased glomerular filtration rate (GFR) between 15-29 mL/min/1.73 square meter (HCC) 11/14/2021   Insulin -requiring or dependent type II diabetes mellitus (HCC) 03/28/2021   Iron deficiency anemia due to chronic blood loss 08/26/2020   Paroxysmal atrial fibrillation (HCC) 08/12/2020   Sinus tachycardia    Vitamin B12 deficiency anemia due to intrinsic factor deficiency 05/19/2020   Primary osteoarthritis involving multiple joints 05/19/2020   Hyperlipidemia associated with type 2 diabetes mellitus (HCC) 05/18/2020   Type 2 diabetes mellitus (HCC) 02/22/2020   Hypertension associated with diabetes (HCC)    Gout    CAD (coronary artery disease) of artery bypass graft 02/2020   Past Medical History:  Diagnosis Date   Anemia    hx iron transfusion   Anxiety 10/08/16   Arthritis    left shoulder   CAD (coronary artery disease) of artery bypass graft 02/2020   CHF (congestive heart failure) (HCC) 09/2021  Chronic kidney disease 09/2021   Clotting disorder (HCC) 02/2020   Diabetes mellitus without complication (HCC)    type 2   Elevated blood uric acid level    GERD (gastroesophageal reflux disease) 06/2014   GI bleed    Heart murmur 04/2013   History of blood transfusion  08/19/2020   Hyperlipidemia LDL goal <70    Hypertension    Myocardial infarction (HCC) 02/2020   Neuromuscular disorder (HCC) 07/08/16   PAF (paroxysmal atrial fibrillation) (HCC)    Stroke (HCC) 09/2021   Ulcer 07/2020   Wears partial dentures    upper dent/lower partial   Past Surgical History:  Procedure Laterality Date   BIOPSY  08/17/2020   Procedure: BIOPSY;  Surgeon: Sergio Dandy, MD;  Location: [facility];  Service: Endoscopy;;   COLONOSCOPY WITH PROPOFOL  Left 02/27/2020   Procedure: COLONOSCOPY WITH PROPOFOL ;  Surgeon: Felecia Hopper, MD;  Location: [facility] END[facility]COPY;  Service: Gastroenterology;  Laterality: Left;   COLONOSCOPY WITH PROPOFOL  N/A 08/13/2020   Procedure: COLONOSCOPY WITH PROPOFOL ;  Surgeon: Kenney Peacemaker, MD;  Location: [facility] ENDOSCOPY;  Service: Endoscopy;  Laterality: N/A;   CORONARY ARTERY BYPASS GRAFT N/A 03/08/2020   Procedure: CORONARY ARTERY BYPASS GRAFTING (CABG) x 5 ON PUMP;  Surgeon: Rudine Cos, MD;  Location: [facility] OR;  Service: Open Heart Surgery;  Laterality: N/A;   ENDOVEIN HARVEST OF GREATER SAPHENOUS VEIN Right 03/08/2020   Procedure: ENDOVEIN HARVEST OF GREATER SAPHENOUS VEIN;  Surgeon: Rudine Cos, MD;  Location: [facility] OR;  Service: Open Heart Surgery;  Laterality: Right;   ESOPHAGOGASTRODUODENOSCOPY (EGD) WITH PROPOFOL  N/A 02/27/2020   Procedure: ESOPHAGOGASTRODUODENOSCOPY (EGD) WITH PROPOFOL ;  Surgeon: Felecia Hopper, MD;  Location: [facility];  Service: Gastroenterology;  Laterality: N/A;   ESOPHAGOGASTRODUODENOSCOPY (EGD) WITH PROPOFOL  N/A 08/17/2020   Procedure: ESOPHAGOGASTRODUODENOSCOPY (EGD) WITH PROPOFOL ;  Surgeon: Sergio Dandy, MD;  Location: [facility] ENDOS[facility];  Service: Endoscopy;  Laterality: N/A;   INSERTION OF MESH Left 05/09/2021   Procedure: INSERTION OF MESH;  Surgeon: Shela Derby, MD;  Location: [facility] OR;  Service: General;  Laterality: Left;   LEFT HEART CATH AND CORONARY ANGIOGRAPHY N/A 03/04/2020   Procedure:  LEFT HEART CATH AND CORONARY ANGIOGRAPHY;  Surgeon: Odie Benne, MD;  Location: [facility] INVASIVE CV LAB;  Service: Cardiovascular;  Laterality: N/A;   LEFT HEART CATH AND CORS/GRAFTS ANGIOGRAPHY N/A 04/05/2023   Procedure: LEFT HEART CATH AND CORS/GRAFTS ANGIOGRAPHY;  Surgeon: Arleen Lacer, MD;  Location: [facility] INVASIVE CV LAB;  Service: Cardiovascular;  Laterality: N/A;   RADIAL ARTERY HARVEST Left 03/08/2020   Procedure: RADIAL ARTERY HARVEST;  Surgeon: Rudine Cos, MD;  Location: [facility] OR;  Service: Open Heart Surgery;  Laterality: Left;   TEE WITHOUT CARDIOVERSION N/A 03/08/2020   Procedure: TRANSESOPHAGEAL ECHOCARDIOGRAM (TEE);  Surgeon: Rudine Cos, MD;  Location: [facility] OR;  Service: Open Heart Surgery;  Laterality: N/A;   XI ROBOTIC ASSISTED INGUINAL HERNIA REPAIR WITH MESH Left 05/09/2021   Procedure: XI ROBOTIC ASSISTED LEFT INGUINAL HERNIA REPAIR WITH MESH;  Surgeon: Shela Derby, MD;  Location: [facility] OR;  Service: General;  Laterality: Left;   Family History  Problem Relation Age of Onset   Stroke Mother    Migraines Mother    Hypertension Mother    Hyperlipidemia Mother    Heart failure Mother    Diabetes Mother    Arthritis Mother    Hypertension Father    Hyperlipidemia Father    Heart failure Father    Diabetes Father  Asthma Father    Rheum arthritis Sister    Osteoarthritis Sister    Thyroid  disease Sister    Rheum arthritis Brother    Osteoarthritis Brother    Thyroid  disease Brother    Diabetes Sister     Current Outpatient Medications:    allopurinol  (ZYLOPRIM ) 100 MG tablet, Take 1 tablet (100 mg total) by mouth daily., Disp: 90 tablet, Rfl: 3   apixaban  (ELIQUIS ) 5 MG TABS tablet, Take 1 tablet (5 mg total) by mouth 2 (two) times daily., Disp: 60 tablet, Rfl: 5   ASPIRIN  LOW DOSE 81 MG EC tablet, Take 81 mg by mouth every 6 (six) hours as needed for moderate pain., Disp: , Rfl:    Blood Pressure Monitoring (BLOOD PRESSURE DIGITAL SOLN) KIT, ,  Disp: , Rfl:    carvedilol  (COREG ) 12.5 MG tablet, Take 1 tablet (12.5 mg total) by mouth 2 (two) times daily., Disp: 180 tablet, Rfl: 0   Continuous Glucose Receiver (FREESTYLE LIBRE 3 READER) DEVI, Use to monitor blood sugar as directed, Disp: 1 each, Rfl: 3   Continuous Glucose Sensor (FREESTYLE LIBRE 3 SENSOR) MISC, Place 1 sensor on the skin every 14 days. Use to check glucose continuously, Disp: 2 each, Rfl: 11   empagliflozin  (JARDIANCE ) 10 MG TABS tablet, Take 1 tablet (10 mg total) by mouth daily before breakfast., Disp: 30 tablet, Rfl: 12   Evolocumab  (REPATHA  SURECLICK) 140 MG/ML SOAJ, Inject 140 mg into the skin every 14 (fourteen) days., Disp: 2 mL, Rfl: 11   ezetimibe  (ZETIA ) 10 MG tablet, TAKE 1 TABLET BY MOUTH EVERY DAY, Disp: 90 tablet, Rfl: 1   Fe Bisgly-Vit C-Vit B12-FA (GENTLE IRON) 28-60-0.008-0.4 MG CAPS, Take 1 tablet by mouth in the morning., Disp: , Rfl:    gabapentin  (NEURONTIN ) 100 MG capsule, Take 1 tablet by mouth at bedtime. Can also take 1 tablet in morning or afternoon if needed for pain., Disp: , Rfl:    isosorbide  mononitrate (IMDUR ) 60 MG 24 hr tablet, Take 1 tablet (60 mg total) by mouth daily., Disp: 90 tablet, Rfl: 3   Magnesium  300 MG CAPS, Take 300 mg by mouth daily., Disp: , Rfl:    Multiple Vitamin (MULTIVITAMIN WITH MINERALS) TABS tablet, Take 1 tablet by mouth daily., Disp: , Rfl:    nitroGLYCERIN  (NITROSTAT ) 0.4 MG SL tablet, Place 1 tablet (0.4 mg total) under the tongue every 5 (five) minutes as needed for chest pain (not more than 3 doses)., Disp: 100 tablet, Rfl: 3   Olopatadine  HCl (PATADAY ) 0.2 % SOLN, Apply 1 drop to both eyes once daily, Disp: 2.5 mL, Rfl: 3   RYBELSUS  14 MG TABS, TAKE 1 TABLET BY MOUTH EVERY DAY, Disp: 90 tablet, Rfl: 1   sacubitril -valsartan  (ENTRESTO ) 24-26 MG, Take 1 tablet by mouth 2 (two) times daily., Disp: 180 tablet, Rfl: 3   spironolactone  (ALDACTONE ) 25 MG tablet, Take 1/2 tablet (12.5 mg total) by mouth daily., Disp: 45  tablet, Rfl: 3   vitamin B-12 (CYANOCOBALAMIN ) 1000 MCG tablet, Take 1,000 mcg by mouth in the morning., Disp: , Rfl:  Allergies  Allergen Reactions   Atorvastatin  Other (See Comments)    Significant elevation in LFTs while on statins   Crestor  [Rosuvastatin ] Other (See Comments)    Significant elevation in LFTs while on statins   Metformin  And Related Other (See Comments)   Propofol  Other (See Comments)    Encephalopathy   Insulin  Palpitations    Bad reaction      ROS: A  complete ROS was performed with pertinent positives/negatives noted in the HPI. The remainder of the ROS are negative.    Objective:   Today's Vitals   07/29/23 0857  BP: 116/70  Pulse: 80  Temp: 98.3 F (36.8 C)  TempSrc: Temporal  SpO2: 97%  Weight: 185 lb (83.9 kg)  Height: 5\' 8"  (1.727 m)    GENERAL: Well-appearing, in NAD. Well nourished.  SKIN: Pink, warm and dry. No rash, lesion, ulceration, or ecchymoses.  NECK: Trachea midline. Full ROM w/o pain or tenderness. No lymphadenopathy.  RESPIRATORY: Chest wall symmetrical. Respirations even and non-labored. Breath sounds clear to auscultation bilaterally.  CARDIAC: S1, S2 present, regular rate and rhythm. Peripheral pulses 2+ bilaterally.  EXTREMITIES: Without clubbing, cyanosis, or edema.  NEUROLOGIC:  Steady, even gait.  PSYCH/MENTAL STATUS: Alert, oriented x 3. Cooperative, appropriate mood and affect.   Health Maintenance Due  Topic Date Due   Medicare Annual Wellness (AWV)  Never done   Diabetic kidney evaluation - Urine ACR  05/15/2023    No results found for any visits on 07/29/23.  The ASCVD Risk score (Arnett DK, et al., 2019) failed to calculate for the following reasons:   Risk score cannot be calculated because patient has a medical history suggesting prior/existing ASCVD     Assessment & Plan:  Assessment and Plan    Type 2 Diabetes Mellitus with Diabetic Polyneuropathy Blood glucose levels variable, morning readings in 180s.  Gabapentin  alleviates neuropathy but causes orthostatic dizziness. A1c and renal function require monitoring. - Continue Rybelsus  14 mg PO daily. - Continue Jardiance  10 mg PO daily. - Continue gabapentin  100 mg PO daily. - Check A1c today. - Perform urine microalbumin test today. - Monitor renal function today.  Hypertension Well-controlled with current regimen. Dizziness may be related to orthostatic hypotension. - Continue current antihypertensive regimen.  Chronic Systolic Heart Failure Managed with Entresto . Recently evaluated by cardiology. - Refill Entresto . - Continue cardiology follow-ups as scheduled.  Chronic Kidney Disease stage 4  - Check BMP   Atrial Fibrillation Managed with Eliquis . Compliant with dosing. - Continue Eliquis  twice daily.  Hyperlipidemia  - Stable  - Statin intolerant.   General Health Maintenance Immunizations up to date. Eye exam due in July. - Ensure eye exam is completed in July.   Orders Placed This Encounter  Procedures   Basic Metabolic Panel (BMET)   Hemoglobin A1C   Microalbumin / creatinine urine ratio   No images are attached to the encounter or orders placed in the encounter. Meds ordered this encounter  Medications   sacubitril -valsartan  (ENTRESTO ) 24-26 MG    Sig: Take 1 tablet by mouth 2 (two) times daily.    Dispense:  180 tablet    Refill:  3    Supervising Provider:   Catheryn Cluck [[id number]]    Return in about 4 months (around 11/28/2023) for Chronic Condition follow up.   Gavin Kast, FNP

## 2023-07-30 ENCOUNTER — Other Ambulatory Visit: Payer: Self-pay

## 2023-07-30 DIAGNOSIS — Z79899 Other long term (current) drug therapy: Secondary | ICD-10-CM

## 2023-07-30 DIAGNOSIS — I480 Paroxysmal atrial fibrillation: Secondary | ICD-10-CM

## 2023-07-30 DIAGNOSIS — I255 Ischemic cardiomyopathy: Secondary | ICD-10-CM

## 2023-07-30 DIAGNOSIS — I2510 Atherosclerotic heart disease of native coronary artery without angina pectoris: Secondary | ICD-10-CM

## 2023-08-02 ENCOUNTER — Ambulatory Visit: Payer: Self-pay | Admitting: Internal Medicine

## 2023-08-05 LAB — BASIC METABOLIC PANEL WITH GFR
BUN/Creatinine Ratio: 12 (ref 10–24)
BUN: 24 mg/dL (ref 8–27)
CO2: 18 mmol/L — ABNORMAL LOW (ref 20–29)
Calcium: 9.2 mg/dL (ref 8.6–10.2)
Chloride: 100 mmol/L (ref 96–106)
Creatinine, Ser: 2 mg/dL — ABNORMAL HIGH (ref 0.76–1.27)
Glucose: 360 mg/dL — ABNORMAL HIGH (ref 70–99)
Potassium: 4.7 mmol/L (ref 3.5–5.2)
Sodium: 136 mmol/L (ref 134–144)
eGFR: 37 mL/min/{1.73_m2} — ABNORMAL LOW (ref >59)

## 2023-08-06 ENCOUNTER — Ambulatory Visit: Payer: Self-pay | Admitting: Nurse Practitioner

## 2023-08-28 ENCOUNTER — Ambulatory Visit (HOSPITAL_COMMUNITY)
Admission: RE | Admit: 2023-08-28 | Discharge: 2023-08-28 | Disposition: A | Discharge status: Home / Self Care | Payer: PRIVATE HEALTH INSURANCE | Source: Ambulatory Visit | Attending: Nurse Practitioner | Admitting: Nurse Practitioner

## 2023-08-28 DIAGNOSIS — I2510 Atherosclerotic heart disease of native coronary artery without angina pectoris: Secondary | ICD-10-CM | POA: Diagnosis present

## 2023-08-28 DIAGNOSIS — I255 Ischemic cardiomyopathy: Secondary | ICD-10-CM | POA: Insufficient documentation

## 2023-08-28 DIAGNOSIS — I10 Essential (primary) hypertension: Secondary | ICD-10-CM | POA: Insufficient documentation

## 2023-08-28 DIAGNOSIS — I739 Peripheral vascular disease, unspecified: Secondary | ICD-10-CM | POA: Diagnosis present

## 2023-08-28 DIAGNOSIS — E119 Type 2 diabetes mellitus without complications: Secondary | ICD-10-CM | POA: Insufficient documentation

## 2023-08-28 DIAGNOSIS — Z8673 Personal history of transient ischemic attack (TIA), and cerebral infarction without residual deficits: Secondary | ICD-10-CM | POA: Diagnosis present

## 2023-08-28 DIAGNOSIS — R600 Localized edema: Secondary | ICD-10-CM | POA: Insufficient documentation

## 2023-08-28 DIAGNOSIS — I480 Paroxysmal atrial fibrillation: Secondary | ICD-10-CM | POA: Insufficient documentation

## 2023-08-30 ENCOUNTER — Encounter: Payer: Self-pay | Admitting: Family

## 2023-08-30 LAB — VAS US ABI WITH/WO TBI
Left ABI: 1.13
Right ABI: 1.24

## 2023-09-30 LAB — HM DIABETES EYE EXAM

## 2023-10-01 ENCOUNTER — Encounter: Payer: Self-pay | Admitting: Internal Medicine

## 2023-10-11 ENCOUNTER — Encounter: Payer: Self-pay | Admitting: Cardiovascular Disease

## 2023-10-22 ENCOUNTER — Other Ambulatory Visit (HOSPITAL_COMMUNITY): Payer: Self-pay

## 2023-10-22 ENCOUNTER — Telehealth: Payer: Self-pay | Admitting: Pharmacy Technician

## 2023-10-22 ENCOUNTER — Encounter: Payer: Self-pay | Admitting: Family

## 2023-10-22 NOTE — Telephone Encounter (Signed)
     Pharmacy Patient Advocate Encounter   Received notification from Onbase that prior authorization for REPATHA  is required/requested.   Insurance verification completed.   The patient is insured through [facility] .   Per test claim: PA required; PA submitted to above mentioned insurance via Latent Key/confirmation #/EOC [id number] Status is pending

## 2023-10-22 NOTE — Telephone Encounter (Signed)
 Pharmacy Patient Advocate Encounter  Received notification from [employer] that Prior Authorization for REPATHA  has been APPROVED from 10/22/23 to 04/20/24   PA #/Case ID/Reference #: [id number]

## 2023-10-23 ENCOUNTER — Telehealth: Payer: Self-pay

## 2023-10-23 NOTE — Telephone Encounter (Signed)
 Please advise Copied from CRM #[id number]. Topic: Clinical - Medical Advice >> Oct 23, 2023  4:33 PM Viola F wrote: Reason for CRM: Jasmin from [facility] needs to know if patient has diabetes, cardiovascular disease or chronic heart failure. Please call her at [phone]. Reference #[id number]

## 2023-10-23 NOTE — Telephone Encounter (Signed)
 Yes -patient has diabetes, cardiovascular disease, and chronic heart failure.

## 2023-10-24 NOTE — Telephone Encounter (Signed)
 Returned call to [facility] and provided requested info

## 2023-10-30 ENCOUNTER — Telehealth: Payer: Self-pay | Admitting: Internal Medicine

## 2023-10-30 NOTE — Telephone Encounter (Signed)
 Patient dropped off document Insurance Form, to be filled out by provider. Patient requested to send it back via Fax within 2-days. Document is located in providers tray at front office.Please advise at [facility] [phone]  Pt would like a cb when this has been faxed off.

## 2023-10-30 NOTE — Telephone Encounter (Signed)
 PAPERWORK/FORMS received  Dropped off by: Patient Call back #: [phone] Individual made aware of 3-5 business day turn around (YES/NO): ? GREEN charge sheet completed and patient made aware of possible charge (YES/NO):  Placed in provider folder at front desk. ~~~ route to CMA/provider Team  CLINICAL USE BELOW THIS LINE (use X to signify action taken)  ___ Form received and placed in providers office for signature. ___ Form completed and faxed to LOA Dept.  ___ Form completed & LVM to notify patient ready for pick up.  ___ Charge sheet and copy of form in front office folder for office supervisor.   Pt would like paperwork to be faxed and notified once faxed.

## 2023-11-01 ENCOUNTER — Telehealth: Payer: Self-pay

## 2023-11-01 NOTE — Telephone Encounter (Signed)
 Spoke with pt he know paper work is ready to be picked up. Already faxed to [facility] [facility].  Avelina Finder, CMA

## 2023-11-01 NOTE — Telephone Encounter (Signed)
 PAPERWORK/FORMS received      CLINICAL USE BELOW THIS LINE (use X to signify action taken)  __x Form received and placed in providers office for signature. _x  Form completed and faxed [facility] __x_ Form completed and  notified patient ready for pick up.  _x__ Charge sheet and copy of form in front office folder for office supervisor.   Avelina Finder, CMA

## 2023-11-04 ENCOUNTER — Encounter: Payer: Self-pay | Admitting: Family

## 2023-11-20 ENCOUNTER — Other Ambulatory Visit: Payer: Self-pay | Admitting: Cardiovascular Disease

## 2023-11-20 ENCOUNTER — Other Ambulatory Visit (HOSPITAL_COMMUNITY): Payer: Self-pay

## 2023-11-20 DIAGNOSIS — E119 Type 2 diabetes mellitus without complications: Secondary | ICD-10-CM

## 2023-11-20 DIAGNOSIS — D500 Iron deficiency anemia secondary to blood loss (chronic): Secondary | ICD-10-CM

## 2023-11-20 DIAGNOSIS — Z79899 Other long term (current) drug therapy: Secondary | ICD-10-CM

## 2023-11-20 DIAGNOSIS — Z951 Presence of aortocoronary bypass graft: Secondary | ICD-10-CM

## 2023-11-20 DIAGNOSIS — D510 Vitamin B12 deficiency anemia due to intrinsic factor deficiency: Secondary | ICD-10-CM

## 2023-11-20 DIAGNOSIS — I25118 Atherosclerotic heart disease of native coronary artery with other forms of angina pectoris: Secondary | ICD-10-CM

## 2023-11-20 DIAGNOSIS — E782 Mixed hyperlipidemia: Secondary | ICD-10-CM

## 2023-11-20 DIAGNOSIS — K922 Gastrointestinal hemorrhage, unspecified: Secondary | ICD-10-CM

## 2023-11-20 DIAGNOSIS — E1159 Type 2 diabetes mellitus with other circulatory complications: Secondary | ICD-10-CM

## 2023-11-21 ENCOUNTER — Other Ambulatory Visit (HOSPITAL_COMMUNITY): Payer: Self-pay

## 2023-11-21 MED ORDER — CARVEDILOL 12.5 MG PO TABS
12.5000 mg | ORAL_TABLET | Freq: Two times a day (BID) | ORAL | 2 refills | Status: AC
  Filled 2023-11-21: qty 60, 30d supply, fill #0
  Filled 2024-02-11: qty 60, 30d supply, fill #1

## 2023-11-22 ENCOUNTER — Other Ambulatory Visit (HOSPITAL_COMMUNITY): Payer: Self-pay

## 2023-11-26 ENCOUNTER — Encounter: Payer: Self-pay | Admitting: Family

## 2023-12-04 ENCOUNTER — Ambulatory Visit: Admitting: Internal Medicine

## 2023-12-04 ENCOUNTER — Encounter: Payer: Self-pay | Admitting: Internal Medicine

## 2023-12-04 VITALS — BP 118/72 | HR 74 | Temp 98.7°F | Ht 68.0 in | Wt 185.4 lb

## 2023-12-04 DIAGNOSIS — E1169 Type 2 diabetes mellitus with other specified complication: Secondary | ICD-10-CM

## 2023-12-04 DIAGNOSIS — E785 Hyperlipidemia, unspecified: Secondary | ICD-10-CM

## 2023-12-04 DIAGNOSIS — E1159 Type 2 diabetes mellitus with other circulatory complications: Secondary | ICD-10-CM | POA: Diagnosis not present

## 2023-12-04 DIAGNOSIS — N1832 Chronic kidney disease, stage 3b: Secondary | ICD-10-CM

## 2023-12-04 DIAGNOSIS — Z7984 Long term (current) use of oral hypoglycemic drugs: Secondary | ICD-10-CM

## 2023-12-04 DIAGNOSIS — I2510 Atherosclerotic heart disease of native coronary artery without angina pectoris: Secondary | ICD-10-CM

## 2023-12-04 DIAGNOSIS — Z23 Encounter for immunization: Secondary | ICD-10-CM

## 2023-12-04 DIAGNOSIS — D500 Iron deficiency anemia secondary to blood loss (chronic): Secondary | ICD-10-CM

## 2023-12-04 DIAGNOSIS — I152 Hypertension secondary to endocrine disorders: Secondary | ICD-10-CM | POA: Diagnosis not present

## 2023-12-04 DIAGNOSIS — E1142 Type 2 diabetes mellitus with diabetic polyneuropathy: Secondary | ICD-10-CM

## 2023-12-04 DIAGNOSIS — N184 Chronic kidney disease, stage 4 (severe): Secondary | ICD-10-CM

## 2023-12-04 LAB — CBC WITH DIFFERENTIAL/PLATELET
Basophils Absolute: 0 K/uL (ref 0.0–0.1)
Basophils Relative: 0.5 % (ref 0.0–3.0)
Eosinophils Absolute: 0.2 K/uL (ref 0.0–0.7)
Eosinophils Relative: 2.7 % (ref 0.0–5.0)
HCT: 46.3 % (ref 39.0–52.0)
Hemoglobin: 15.6 g/dL (ref 13.0–17.0)
Lymphocytes Relative: 32.9 % (ref 12.0–46.0)
Lymphs Abs: 2.3 K/uL (ref 0.7–4.0)
MCHC: 33.7 g/dL (ref 30.0–36.0)
MCV: 94.9 fl (ref 78.0–100.0)
Monocytes Absolute: 0.3 K/uL (ref 0.1–1.0)
Monocytes Relative: 5 % (ref 3.0–12.0)
Neutro Abs: 4.1 K/uL (ref 1.4–7.7)
Neutrophils Relative %: 58.9 % (ref 43.0–77.0)
Platelets: 150 K/uL (ref 150.0–400.0)
RBC: 4.88 Mil/uL (ref 4.22–5.81)
RDW: 13.1 % (ref 11.5–15.5)
WBC: 7 K/uL (ref 4.0–10.5)

## 2023-12-04 LAB — COMPREHENSIVE METABOLIC PANEL WITH GFR
ALT: 20 U/L (ref 0–53)
AST: 17 U/L (ref 0–37)
Albumin: 4.6 g/dL (ref 3.5–5.2)
Alkaline Phosphatase: 54 U/L (ref 39–117)
BUN: 24 mg/dL — ABNORMAL HIGH (ref 6–23)
CO2: 25 meq/L (ref 19–32)
Calcium: 10 mg/dL (ref 8.4–10.5)
Chloride: 104 meq/L (ref 96–112)
Creatinine, Ser: 1.42 mg/dL (ref 0.40–1.50)
GFR: 53.26 mL/min — ABNORMAL LOW (ref >60.00)
Glucose, Bld: 192 mg/dL — ABNORMAL HIGH (ref 70–99)
Potassium: 5.2 meq/L — ABNORMAL HIGH (ref 3.5–5.1)
Sodium: 137 meq/L (ref 135–145)
Total Bilirubin: 0.7 mg/dL (ref 0.2–1.2)
Total Protein: 8.4 g/dL — ABNORMAL HIGH (ref 6.0–8.3)

## 2023-12-04 LAB — HEMOGLOBIN A1C: Hgb A1c MFr Bld: 9.1 % — ABNORMAL HIGH (ref 4.6–6.5)

## 2023-12-04 NOTE — Progress Notes (Signed)
 [facility] [address] [place] [address] Dept: [phone] Dept Fax: [phone]    Subjective:   Colton Guzman Colton Guzman. December 03, 1960 12/04/2023  Chief Complaint  Patient presents with   Follow-up    4 months no concerns    HPI: Colton Borbe Geers Jr. presents today for re-assessment and management of chronic medical conditions.   History of Present Illness   Colton Bodi Bracen Schum. is a 63 year old male with type 2 diabetes and diabetic polyneuropathy who presents for a routine chronic condition follow-up.  He has type 2 diabetes with diabetic polyneuropathy, managed with Jardiance  10 mg once daily and Rybelsus  14 mg. His blood sugar levels are often elevated in the morning, around 180 mg/dL per patient, but can drop to as low as 70 mg/dL. He acknowledges that his diet, which includes sugary foods and high carbohydrates like rice and pasta, may be contributing to his blood sugar levels.  He has hypertension associated with diabetes, managed with carvedilol  12.5 mg twice daily and Imdur  60 mg once daily.  He has a history of coronary artery disease with a bypass graft and is under cardiology care. He is currently on Entresto  and Zetia  for cholesterol management, and he also uses Repatha  injections for HLD.   He has chronic kidney disease and mentions having seen a kidney doctor while hospitalized previously. Does not currently have nephrologist. Last kidney function at PCP office visit on 06/09 showed creatinine 1.44 and GFR 52, which was stable from prior readings. However, saw had lab work done with cardiology on 6/17, which showed decrease GFR to 37 and creatinine 2.00. Cardiology team advised patient see nephrology.   He has a history of anemia but has not been on iron supplements for six months as his iron levels have stabilized. No acute blood loss. He continues to take vitamin B12 and vitamin D3 supplements.      No CP, SHOB.    The following portions of the patient's history were reviewed and updated as appropriate: past medical history, past surgical history, family history, social history, allergies, medications, and problem list.   Patient Active Problem List   Diagnosis Date Noted   Chest pain 04/04/2023   History of CVA (cerebrovascular accident) 04/04/2023   Retinopathy of right eye 11/07/2022   Gastroesophageal reflux disease with esophagitis without hemorrhage 07/31/2022   Coronary artery disease involving native coronary artery of native heart without angina pectoris 05/19/2022   Diabetic polyneuropathy associated with type 2 diabetes mellitus (HCC) 05/19/2022   Chronic renal disease, stage 4, severely decreased glomerular filtration rate (GFR) between 15-29 mL/min/1.73 square meter (HCC) 11/14/2021   Insulin -requiring or dependent type II diabetes mellitus (HCC) 03/28/2021   Iron deficiency anemia due to chronic blood loss 08/26/2020   Paroxysmal atrial fibrillation (HCC) 08/12/2020   Sinus tachycardia    Vitamin B12 deficiency anemia due to intrinsic factor deficiency 05/19/2020   Primary osteoarthritis involving multiple joints 05/19/2020   Hyperlipidemia associated with type 2 diabetes mellitus (HCC) 05/18/2020   Type 2 diabetes mellitus (HCC) 02/22/2020   Hypertension associated with diabetes (HCC)    Gout    CAD (coronary artery disease) of artery bypass graft 02/2020   Past Medical History:  Diagnosis Date   Anemia    hx iron transfusion   Anxiety 10/08/16   Arthritis    left shoulder   CAD (coronary artery disease) of artery bypass graft 02/2020   CHF (congestive heart failure) (HCC)  09/2021   Chronic kidney disease 09/2021   Clotting disorder 02/2020   Diabetes mellitus without complication (HCC)    type 2   Elevated blood uric acid level    GERD (gastroesophageal reflux disease) 06/2014   GI bleed    Heart murmur 04/2013   History of blood transfusion  08/19/2020   Hyperlipidemia LDL goal <70    Hypertension    Myocardial infarction (HCC) 02/2020   Neuromuscular disorder (HCC) 07/08/16   PAF (paroxysmal atrial fibrillation) (HCC)    Stroke (HCC) 09/2021   Ulcer 07/2020   Wears partial dentures    upper dent/lower partial   Past Surgical History:  Procedure Laterality Date   BIOPSY  08/17/2020   Procedure: BIOPSY;  Surgeon: Shila Gustav GAILS, MD;  Location: [facility];  Service: Endoscopy;;   COLONOSCOPY WITH PROPOFOL  Left 02/27/2020   Procedure: COLONOSCOPY WITH PROPOFOL ;  Surgeon: Elicia Claw, MD;  Location: [facility] ENDOSCOPY;  Service: Gastroenterology;  Laterality: Left;   COLONOSCOPY WITH PROPOFOL  N/A 08/13/2020   Procedure: COLONOSCOPY WITH PROPOFOL ;  Surgeon: Avram Lupita BRAVO, MD;  Location: [facility] ENDOSCOPY;  Service: Endoscopy;  Laterality: N/A;   CORONARY ARTERY BYPASS GRAFT N/A 03/08/2020   Procedure: CORONARY ARTERY BYPASS GRAFTING (CABG) x 5 ON PUMP;  Surgeon: German Bartlett PEDLAR, MD;  Location: [facility] OR;  Service: Open Heart Surgery;  Laterality: N/A;   ENDOVEIN HARVEST OF GREATER SAPHENOUS VEIN Right 03/08/2020   Procedure: ENDOVEIN HARVEST OF GREATER SAPHENOUS VEIN;  Surgeon: German Bartlett PEDLAR, MD;  Location: [facility] OR;  Service: Open Heart Surgery;  Laterality: Right;   ESOPHAGOGASTRODUODENOSCOPY (EGD) WITH PROPOFOL  N/A 02/27/2020   Procedure: ESOPHAGOGASTRODUODENOSCOPY (EGD) WITH PROPOFOL ;  Surgeon: Elicia Claw, MD;  Location: [facility] ENDOSCOPY;  Service: Gastroenterology;  Laterality: N/A;   ESOPHAGOGASTRODUODENOSCOPY (EGD) WITH PROPOFOL  N/A 08/17/2020   Procedure: ESOPHAGOGASTRODUODENOSCOPY (EGD) WITH PROPOFOL ;  Surgeon: Shila Gustav GAILS, MD;  Location: [facility] ENDOSCOPY;  Service: Endoscopy;  Laterality: N/A;   INSERTION OF MESH Left 05/09/2021   Procedure: INSERTION OF MESH;  Surgeon: Rubin Calamity, MD;  Location: [facility] OR;  Service: General;  Laterality: Left;   LEFT HEART CATH AND CORONARY ANGIOGRAPHY N/A 03/04/2020   Procedure:  LEFT HEART CATH AND CORONARY ANGIOGRAPHY;  Surgeon: Verlin Lonni BIRCH, MD;  Location: [facility] INVASIVE CV LAB;  Service: Cardiovascular;  Laterality: N/A;   LEFT HEART CATH AND CORS/GRAFTS ANGIOGRAPHY N/A 04/05/2023   Procedure: LEFT HEART CATH AND CORS/GRAFTS ANGIOGRAPHY;  Surgeon: Anner Alm ORN, MD;  Location: [facility] INVASIVE CV LAB;  Service: Cardiovascular;  Laterality: N/A;   RADIAL ARTERY HARVEST Left 03/08/2020   Procedure: RADIAL ARTERY HARVEST;  Surgeon: German Bartlett PEDLAR, MD;  Location: [facility] OR;  Service: Open Heart Surgery;  Laterality: Left;   TEE WITHOUT CARDIOVERSION N/A 03/08/2020   Procedure: TRANSESOPHAGEAL ECHOCARDIOGRAM (TEE);  Surgeon: German Bartlett PEDLAR, MD;  Location: [facility] OR;  Service: Open Heart Surgery;  Laterality: N/A;   XI ROBOTIC ASSISTED INGUINAL HERNIA REPAIR WITH MESH Left 05/09/2021   Procedure: XI ROBOTIC ASSISTED LEFT INGUINAL HERNIA REPAIR WITH MESH;  Surgeon: Rubin Calamity, MD;  Location: [facility] OR;  Service: General;  Laterality: Left;   Family History  Problem Relation Age of Onset   Stroke Mother    Migraines Mother    Hypertension Mother    Hyperlipidemia Mother    Heart failure Mother    Diabetes Mother    Arthritis Mother    Hypertension Father    Hyperlipidemia Father    Heart failure Father  Diabetes Father    Asthma Father    Rheum arthritis Sister    Osteoarthritis Sister    Thyroid  disease Sister    Rheum arthritis Brother    Osteoarthritis Brother    Thyroid  disease Brother    Diabetes Sister     Current Outpatient Medications:    allopurinol  (ZYLOPRIM ) 100 MG tablet, Take 1 tablet (100 mg total) by mouth daily., Disp: 90 tablet, Rfl: 3   apixaban  (ELIQUIS ) 5 MG TABS tablet, Take 1 tablet (5 mg total) by mouth 2 (two) times daily., Disp: 60 tablet, Rfl: 5   ASPIRIN  LOW DOSE 81 MG EC tablet, Take 81 mg by mouth every 6 (six) hours as needed for moderate pain., Disp: , Rfl:    Blood Pressure Monitoring (BLOOD PRESSURE DIGITAL SOLN) KIT, ,  Disp: , Rfl:    carvedilol  (COREG ) 12.5 MG tablet, TAKE 1 TABLET BY MOUTH 2 TIMES DAILY., Disp: 180 tablet, Rfl: 2   carvedilol  (COREG ) 12.5 MG tablet, Take 1 tablet (12.5 mg total) by mouth 2 (two) times daily., Disp: 180 tablet, Rfl: 2   Continuous Glucose Receiver (FREESTYLE LIBRE 3 READER) DEVI, Use to monitor blood sugar as directed, Disp: 1 each, Rfl: 3   Evolocumab  (REPATHA  SURECLICK) 140 MG/ML SOAJ, Inject 140 mg into the skin every 14 (fourteen) days., Disp: 2 mL, Rfl: 11   ezetimibe  (ZETIA ) 10 MG tablet, TAKE 1 TABLET BY MOUTH EVERY DAY, Disp: 90 tablet, Rfl: 1   Magnesium  300 MG CAPS, Take 300 mg by mouth daily., Disp: , Rfl:    Multiple Vitamin (MULTIVITAMIN WITH MINERALS) TABS tablet, Take 1 tablet by mouth daily., Disp: , Rfl:    nitroGLYCERIN  (NITROSTAT ) 0.4 MG SL tablet, Place 1 tablet (0.4 mg total) under the tongue every 5 (five) minutes as needed for chest pain (not more than 3 doses)., Disp: 100 tablet, Rfl: 3   RYBELSUS  14 MG TABS, TAKE 1 TABLET BY MOUTH EVERY DAY, Disp: 90 tablet, Rfl: 1   vitamin B-12 (CYANOCOBALAMIN ) 1000 MCG tablet, Take 1,000 mcg by mouth in the morning., Disp: , Rfl:    empagliflozin  (JARDIANCE ) 10 MG TABS tablet, Take 1 tablet (10 mg total) by mouth daily before breakfast., Disp: 30 tablet, Rfl: 12   Fe Bisgly-Vit C-Vit B12-FA (GENTLE IRON) 28-60-0.008-0.4 MG CAPS, Take 1 tablet by mouth in the morning. (Patient not taking: Reported on 12/04/2023), Disp: , Rfl:    gabapentin  (NEURONTIN ) 100 MG capsule, Take 1 tablet by mouth at bedtime. Can also take 1 tablet in morning or afternoon if needed for pain. (Patient not taking: Reported on 12/04/2023), Disp: , Rfl:    isosorbide  mononitrate (IMDUR ) 60 MG 24 hr tablet, Take 1 tablet (60 mg total) by mouth daily., Disp: 90 tablet, Rfl: 3   sacubitril -valsartan  (ENTRESTO ) 24-26 MG, Take 1 tablet by mouth 2 (two) times daily. (Patient not taking: Reported on 12/04/2023), Disp: 180 tablet, Rfl: 3 Allergies   Allergen Reactions   Atorvastatin  Other (See Comments)    Significant elevation in LFTs while on statins   Crestor  [Rosuvastatin ] Other (See Comments)    Significant elevation in LFTs while on statins   Metformin  And Related Other (See Comments)   Propofol  Other (See Comments)    Encephalopathy   Insulin  Palpitations    Bad reaction      ROS: A complete ROS was performed with pertinent positives/negatives noted in the HPI. The remainder of the ROS are negative.    Objective:   Today's Vitals  12/04/23 0857  BP: 118/72  Pulse: 74  Temp: 98.7 F (37.1 C)  TempSrc: Temporal  SpO2: 98%  Weight: 185 lb 6.4 oz (84.1 kg)  Height: 5' 8 (1.727 m)    GENERAL: Well-appearing, in NAD. Well nourished.  SKIN: Pink, warm and dry.  NECK: Trachea midline. Full ROM w/o pain or tenderness. No lymphadenopathy. No thyromegaly or palpable masses.  RESPIRATORY: Chest wall symmetrical. Respirations even and non-labored. Breath sounds clear to auscultation bilaterally.  CARDIAC: S1, S2 present, regular rate and rhythm. Peripheral pulses 2+ bilaterally.  EXTREMITIES: Without clubbing, cyanosis, or edema.  NEUROLOGIC:  Steady, even gait.  PSYCH/MENTAL STATUS: Alert, oriented x 3. Cooperative, appropriate mood and affect.   Health Maintenance Due  Topic Date Due   Medicare Annual Wellness (AWV)  Never done   Zoster Vaccines- Shingrix (2 of 2) 05/23/2021    The ASCVD Risk score (Arnett DK, et al., 2019) failed to calculate for the following reasons:   Risk score cannot be calculated because patient has a medical history suggesting prior/existing ASCVD     Assessment & Plan:   Assessment and Plan    Type 2 diabetes mellitus with diabetic polyneuropathy Type 2 diabetes with diabetic polyneuropathy managed with Jardiance  10mg  and Rybelsus  14mg . Blood sugars slightly elevated, especially in the morning. Dietary indiscretions contribute. A1c will be checked to assess glycemic control.  medication adjustments may be needed based on A1c. - Check A1c today. - Advise dietary modifications to reduce high sugar and carbohydrate intake. - Consider adjusting diabetes medications based on A1c results.  Chronic kidney disease, stage 3b Chronic kidney disease; referral to nephrology advised for further management. - Place referral to a kidney specialist. - Check kidney function today. - Instruct him to contact if no appointment is scheduled within two weeks.  Hypertension associated with diabetes Hypertension associated with diabetes well-controlled with carvedilol  and Imdur . Blood pressure 118/72 mmHg indicates good control. - Continue carvedilol  12.5mg  BID and imdur  60mg   Hyperlipidemia associated with diabetes  - continue repatha  injections and zetia  10mg    Coronary artery disease with prior bypass graft Coronary artery disease with prior bypass graft managed by cardiology. Current medications include Entresto , carvedilol , and isosorbide  mononitrate.  Iron deficiency anemia, history of blood loss Iron deficiency anemia secondary to blood loss, previously managed with iron supplements. Supplements discontinued six months ago due to stable levels. - Check blood counts today.  General Health Maintenance General health maintenance includes immunizations and routine screenings. Due for flu shot and second shingles vaccine dose, which must be obtained at a pharmacy. - Administer flu shot today. - Instruct him to obtain shingles vaccine at a pharmacy.  Follow-Up - Schedule follow-up appointment in four months. - Instruct him to contact for medication refills if needed before next visit.       Orders Placed This Encounter  Procedures   Flu vaccine trivalent PF, 6mos and older(Flulaval,Afluria,Fluarix,Fluzone)   Hemoglobin A1C   Comp Met (CMET)   CBC with Differential/Platelet   Ambulatory referral to Nephrology    Referral Priority:   Routine    Referral Type:    Consultation    Referral Reason:   Specialty Services Required    Requested Specialty:   Nephrology    Number of Visits Requested:   1   No images are attached to the encounter or orders placed in the encounter. No orders of the defined types were placed in this encounter.   Return in about 4 months (around 04/05/2024) for Chronic  Condition follow up.   Rosina Senters, FNP

## 2023-12-06 ENCOUNTER — Other Ambulatory Visit: Payer: Self-pay | Admitting: Medical Genetics

## 2023-12-06 DIAGNOSIS — N1832 Chronic kidney disease, stage 3b: Secondary | ICD-10-CM | POA: Insufficient documentation

## 2023-12-13 ENCOUNTER — Encounter: Payer: Self-pay | Admitting: Cardiovascular Disease

## 2023-12-13 DIAGNOSIS — I480 Paroxysmal atrial fibrillation: Secondary | ICD-10-CM

## 2023-12-16 MED ORDER — APIXABAN 5 MG PO TABS
5.0000 mg | ORAL_TABLET | Freq: Two times a day (BID) | ORAL | 1 refills | Status: AC
Start: 2023-12-16 — End: ?

## 2023-12-16 NOTE — Telephone Encounter (Signed)
 Last Eliquis  prescription was sent for 60 tablets.  I spoke with [facility] and patient only wanted to pay for 10 tablets in August.  Per pharmacy system got stuck on 10 tablets and since August prescription has been filled for 10 tablets.  [facility] requests new prescription be sent to pharmacy.  Will send request to coumadin clinic

## 2023-12-20 ENCOUNTER — Ambulatory Visit: Payer: Self-pay | Admitting: Internal Medicine

## 2023-12-20 NOTE — Telephone Encounter (Signed)
 Pt stated he would change his diet and is not wanting to start insulin .we would revisit this at his next appointment.

## 2023-12-20 NOTE — Telephone Encounter (Signed)
 Pt stated he would change his diet and he was not wanting insulin . We

## 2024-01-09 ENCOUNTER — Other Ambulatory Visit: Payer: Self-pay | Admitting: Internal Medicine

## 2024-01-09 ENCOUNTER — Other Ambulatory Visit: Payer: Self-pay | Admitting: Cardiovascular Disease

## 2024-01-09 DIAGNOSIS — E1142 Type 2 diabetes mellitus with diabetic polyneuropathy: Secondary | ICD-10-CM

## 2024-01-09 DIAGNOSIS — I2510 Atherosclerotic heart disease of native coronary artery without angina pectoris: Secondary | ICD-10-CM

## 2024-01-09 DIAGNOSIS — Z79899 Other long term (current) drug therapy: Secondary | ICD-10-CM

## 2024-01-09 DIAGNOSIS — E782 Mixed hyperlipidemia: Secondary | ICD-10-CM

## 2024-01-09 NOTE — Telephone Encounter (Signed)
 Pt want refill request RYBELSUS  14 MG TABS   LOV 12/04/2023 FOV 04/06/2024 LRF 07/16/23

## 2024-01-10 MED ORDER — EZETIMIBE 10 MG PO TABS: 10.0000 mg | ORAL_TABLET | Freq: Every day | ORAL | 2 refills | Status: AC

## 2024-01-27 ENCOUNTER — Encounter: Payer: Self-pay | Admitting: Family

## 2024-02-24 ENCOUNTER — Other Ambulatory Visit: Payer: Self-pay | Admitting: Medical Genetics

## 2024-02-24 DIAGNOSIS — Z006 Encounter for examination for normal comparison and control in clinical research program: Secondary | ICD-10-CM

## 2024-02-25 ENCOUNTER — Encounter: Payer: Self-pay | Admitting: Family

## 2024-02-26 ENCOUNTER — Encounter: Payer: Self-pay | Admitting: Internal Medicine

## 2024-02-28 ENCOUNTER — Other Ambulatory Visit (HOSPITAL_COMMUNITY): Payer: Self-pay

## 2024-02-28 ENCOUNTER — Other Ambulatory Visit: Payer: Self-pay

## 2024-02-28 DIAGNOSIS — E1142 Type 2 diabetes mellitus with diabetic polyneuropathy: Secondary | ICD-10-CM

## 2024-02-28 MED ORDER — EMPAGLIFLOZIN 10 MG PO TABS
10.0000 mg | ORAL_TABLET | Freq: Every day | ORAL | 12 refills | Status: AC
  Filled 2024-02-28 – 2024-03-09 (×4): qty 30, 30d supply, fill #0

## 2024-03-03 ENCOUNTER — Ambulatory Visit (INDEPENDENT_AMBULATORY_CARE_PROVIDER_SITE_OTHER)

## 2024-03-03 VITALS — BP 122/70 | HR 79 | Temp 98.1°F | Ht 66.5 in | Wt 183.4 lb

## 2024-03-03 DIAGNOSIS — Z0000 Encounter for general adult medical examination without abnormal findings: Secondary | ICD-10-CM | POA: Diagnosis not present

## 2024-03-03 NOTE — Patient Instructions (Addendum)
 Mr. Colton Guzman,  Thank you for taking the time for your Medicare Wellness Visit. I appreciate your continued commitment to your health goals. Please review the care plan we discussed, and feel free to reach out if I can assist you further.  Please note that Annual Wellness Visits do not include a physical exam. Some assessments may be limited, especially if the visit was conducted virtually. If needed, we may recommend an in-person follow-up with your provider.  Ongoing Care Seeing your primary care provider every 3 to 6 months helps us  monitor your health and provide consistent, personalized care.   Referrals If a referral was made during today's visit and you haven't received any updates within two weeks, please contact the referred provider directly to check on the status.  Recommended Screenings:  Health Maintenance  Topic Date Due   Zoster (Shingles) Vaccine (2 of 2) 05/23/2021   Complete foot exam   04/16/2024   Hemoglobin A1C  06/03/2024   Yearly kidney health urinalysis for diabetes  07/28/2024   Eye exam for diabetics  09/29/2024   Yearly kidney function blood test for diabetes  12/03/2024   Medicare Annual Wellness Visit  03/03/2025   DTaP/Tdap/Td vaccine (2 - Td or Tdap) 05/19/2030   Colon Cancer Screening  08/14/2030   Pneumococcal Vaccine for age over 58  Completed   Flu Shot  Completed   Hepatitis B Vaccine  Completed   Hepatitis C Screening  Completed   HIV Screening  Completed   HPV Vaccine  Aged Out   Meningitis B Vaccine  Aged Out   COVID-19 Vaccine  Discontinued       03/02/2024   12:52 PM  Advanced Directives  Does Patient Have a Medical Advance Directive? No  Would patient like information on creating a medical advance directive? No - Patient declined    Vision: Annual vision screenings are recommended for early detection of glaucoma, cataracts, and diabetic retinopathy. These exams can also reveal signs of chronic conditions such as diabetes and high blood  pressure.  Dental: Annual dental screenings help detect early signs of oral cancer, gum disease, and other conditions linked to overall health, including heart disease and diabetes.  Please see the attached documents for additional preventive care recommendations.

## 2024-03-03 NOTE — Progress Notes (Signed)
 "  Chief Complaint  Patient presents with   Medicare Wellness     Subjective:   Colton Borbe Hendler Jr. is a 64 y.o. male who presents for a Medicare Annual Wellness Visit.  Visit info / Clinical Intake: Medicare Wellness Visit Type:: Initial Annual Wellness Visit Persons participating in visit and providing information:: patient Medicare Wellness Visit Mode:: In-person (required for WTM) Interpreter Needed?: No Pre-visit prep was completed: yes AWV questionnaire completed by patient prior to visit?: yes Date:: 03/02/24 Living arrangements:: (Patient-Rptd) lives with spouse/significant other Patient's Overall Health Status Rating: (Patient-Rptd) good Typical amount of pain: (Patient-Rptd) some Does pain affect daily life?: (!) yes (foot pain)  Dietary Habits and Nutritional Risks How many meals a day?: (Patient-Rptd) 3 Eats fruit and vegetables daily?: (Patient-Rptd) yes Most meals are obtained by: (Patient-Rptd) preparing own meals In the last 2 weeks, have you had any of the following?: none Diabetic:: (!) yes Any non-healing wounds?: no How often do you check your BS?: 1  Functional Status Activities of Daily Living (to include ambulation/medication): (!) Needs Assist Feeding: Independent Dressing/Grooming: Independent Bathing: Needs assistance (setting up) Toileting: Independent Transfer: Independent Ambulation: (Patient-Rptd) Independent Medication Administration: Needs assistance (comment) (has a reminder) Home Management (perform basic housework or laundry): (Patient-Rptd) Needs assistance (comment) Manage your own finances?: (Patient-Rptd) yes Primary transportation is: (Patient-Rptd) family / friends Concerns about vision?: (!) yes (sometimes blurry) Concerns about hearing?: no  Fall Screening Falls in the past year?: 1 (loss of feeling in feet) Number of falls in past year: 1 Was there an injury with Fall?: (Patient-Rptd) 0 Fall Risk Category Calculator:  2 Patient Fall Risk Level: Moderate Fall Risk  Fall Risk Patient at Risk for Falls Due to: Impaired balance/gait; Medication side effect; Impaired mobility Fall risk Follow up: Falls prevention discussed; Falls evaluation completed  Home and Transportation Safety: All rugs have non-skid backing?: (!) (Patient-Rptd) no All stairs or steps have railings?: (!) (Patient-Rptd) no Grab bars in the bathtub or shower?: (!) (Patient-Rptd) no Have non-skid surface in bathtub or shower?: (!) (Patient-Rptd) no Good home lighting?: (Patient-Rptd) yes Regular seat belt use?: (Patient-Rptd) yes Hospital stays in the last year:: (Patient-Rptd) no  Cognitive Assessment Difficulty concentrating, remembering, or making decisions? : (Patient-Rptd) yes Will 6CIT or Mini Cog be Completed: yes What year is it?: 0 points What month is it?: 0 points Give patient an address phrase to remember (5 components): [address] [place] About what time is it?: 0 points Count backwards from 20 to 1: 0 points Say the months of the year in reverse: 0 points Repeat the address phrase from earlier: 2 points 6 CIT Score: 2 points  Advance Directives (For Healthcare) Does Patient Have a Medical Advance Directive?: No Would patient like information on creating a medical advance directive?: No - Patient declined  Reviewed/Updated  Reviewed/Updated: Reviewed All (Medical, Surgical, Family, Medications, Allergies, Care Teams, Patient Goals)    Allergies (verified) Atorvastatin , Crestor  [rosuvastatin ], Metformin  and related, Propofol , and Insulin    Current Medications (verified) Outpatient Encounter Medications as of 03/03/2024  Medication Sig   allopurinol  (ZYLOPRIM ) 100 MG tablet Take 1 tablet (100 mg total) by mouth daily.   apixaban  (ELIQUIS ) 5 MG TABS tablet Take 1 tablet (5 mg total) by mouth 2 (two) times daily.   ASPIRIN  LOW DOSE 81 MG EC tablet Take 81 mg by mouth every 6 (six) hours as needed for  moderate pain. (Patient taking differently: Take 81 mg by mouth once.)   Blood Pressure  Monitoring (BLOOD PRESSURE DIGITAL SOLN) KIT    carvedilol  (COREG ) 12.5 MG tablet TAKE 1 TABLET BY MOUTH 2 TIMES DAILY.   empagliflozin  (JARDIANCE ) 10 MG TABS tablet Take 1 tablet (10 mg total) by mouth daily before breakfast.   Evolocumab  (REPATHA  SURECLICK) 140 MG/ML SOAJ Inject 140 mg into the skin every 14 (fourteen) days.   ezetimibe  (ZETIA ) 10 MG tablet Take 1 tablet (10 mg total) by mouth daily.   Fe Bisgly-Vit C-Vit B12-FA (GENTLE IRON) 28-60-0.008-0.4 MG CAPS Take 1 tablet by mouth in the morning.   isosorbide  mononitrate (IMDUR ) 60 MG 24 hr tablet Take 1 tablet (60 mg total) by mouth daily.   Magnesium  300 MG CAPS Take 300 mg by mouth daily.   Multiple Vitamin (MULTIVITAMIN WITH MINERALS) TABS tablet Take 1 tablet by mouth daily.   nitroGLYCERIN  (NITROSTAT ) 0.4 MG SL tablet Place 1 tablet (0.4 mg total) under the tongue every 5 (five) minutes as needed for chest pain (not more than 3 doses).   RYBELSUS  14 MG TABS TAKE 1 TABLET BY MOUTH EVERY DAY   sacubitril -valsartan  (ENTRESTO ) 24-26 MG Take 1 tablet by mouth 2 (two) times daily.   vitamin B-12 (CYANOCOBALAMIN ) 1000 MCG tablet Take 1,000 mcg by mouth in the morning.   carvedilol  (COREG ) 12.5 MG tablet Take 1 tablet (12.5 mg total) by mouth 2 (two) times daily. (Patient not taking: Reported on 03/03/2024)   Continuous Glucose Receiver (FREESTYLE LIBRE 3 READER) DEVI Use to monitor blood sugar as directed (Patient not taking: Reported on 03/03/2024)   gabapentin  (NEURONTIN ) 100 MG capsule Take 1 tablet by mouth at bedtime. Can also take 1 tablet in morning or afternoon if needed for pain. (Patient not taking: Reported on 12/04/2023)   No facility-administered encounter medications on file as of 03/03/2024.    History: Past Medical History:  Diagnosis Date   Anemia    hx iron transfusion   Anxiety 10/08/16   Arthritis    left shoulder   CAD  (coronary artery disease) of artery bypass graft 02/2020   CHF (congestive heart failure) (HCC) 09/2021   Chronic kidney disease 09/2021   Clotting disorder 02/2020   Diabetes mellitus without complication (HCC)    type 2   Elevated blood uric acid level    GERD (gastroesophageal reflux disease) 06/2014   GI bleed    Heart murmur 04/2013   History of blood transfusion 08/19/2020   Hyperlipidemia LDL goal <70    Hypertension    Myocardial infarction (HCC) 02/2020   Neuromuscular disorder (HCC) 07/08/16   PAF (paroxysmal atrial fibrillation) (HCC)    Stroke (HCC) 09/2021   Ulcer 07/2020   Wears partial dentures    upper dent/lower partial   Past Surgical History:  Procedure Laterality Date   BIOPSY  08/17/2020   Procedure: BIOPSY;  Surgeon: Shila Gustav GAILS, MD;  Location: [facility] ENDOSCOPY;  Service: Endoscopy;;   COLONOSCOPY WITH PROPOFOL  Left 02/27/2020   Procedure: COLONOSCOPY WITH PROPOFOL ;  Surgeon: Elicia Claw, MD;  Location: [facility] ENDOSCOPY;  Service: Gastroenterology;  Laterality: Left;   COLONOSCOPY WITH PROPOFOL  N/A 08/13/2020   Procedure: COLONOSCOPY WITH PROPOFOL ;  Surgeon: Avram Lupita BRAVO, MD;  Location: [facility] ENDOSCOPY;  Service: Endoscopy;  Laterality: N/A;   CORONARY ARTERY BYPASS GRAFT N/A 03/08/2020   Procedure: CORONARY ARTERY BYPASS GRAFTING (CABG) x 5 ON PUMP;  Surgeon: German Bartlett PEDLAR, MD;  Location: [facility] OR;  Service: Open Heart Surgery;  Laterality: N/A;   ENDOVEIN HARVEST OF GREATER SAPHENOUS VEIN Right 03/08/2020  Procedure: ENDOVEIN HARVEST OF GREATER SAPHENOUS VEIN;  Surgeon: German Bartlett PEDLAR, MD;  Location: [facility] OR;  Service: Open Heart Surgery;  Laterality: Right;   ESOPHAGOGASTRODUODENOSCOPY (EGD) WITH PROPOFOL  N/A 02/27/2020   Procedure: ESOPHAGOGASTRODUODENOSCOPY (EGD) WITH PROPOFOL ;  Surgeon: Elicia Claw, MD;  Location: [facility] ENDOSCOPY;  Service: Gastroenterology;  Laterality: N/A;   ESOPHAGOGASTRODUODENOSCOPY (EGD) WITH PROPOFOL  N/A 08/17/2020    Procedure: ESOPHAGOGASTRODUODENOSCOPY (EGD) WITH PROPOFOL ;  Surgeon: Shila Gustav GAILS, MD;  Location: [facility] ENDOSCOPY;  Service: Endoscopy;  Laterality: N/A;   INSERTION OF MESH Left 05/09/2021   Procedure: INSERTION OF MESH;  Surgeon: Rubin Calamity, MD;  Location: [facility] OR;  Service: General;  Laterality: Left;   LEFT HEART CATH AND CORONARY ANGIOGRAPHY N/A 03/04/2020   Procedure: LEFT HEART CATH AND CORONARY ANGIOGRAPHY;  Surgeon: Verlin Lonni BIRCH, MD;  Location: [facility] INVASIVE CV LAB;  Service: Cardiovascular;  Laterality: N/A;   LEFT HEART CATH AND CORS/GRAFTS ANGIOGRAPHY N/A 04/05/2023   Procedure: LEFT HEART CATH AND CORS/GRAFTS ANGIOGRAPHY;  Surgeon: Anner Alm ORN, MD;  Location: [facility] INVASIVE CV LAB;  Service: Cardiovascular;  Laterality: N/A;   RADIAL ARTERY HARVEST Left 03/08/2020   Procedure: RADIAL ARTERY HARVEST;  Surgeon: German Bartlett PEDLAR, MD;  Location: [facility] OR;  Service: Open Heart Surgery;  Laterality: Left;   TEE WITHOUT CARDIOVERSION N/A 03/08/2020   Procedure: TRANSESOPHAGEAL ECHOCARDIOGRAM (TEE);  Surgeon: German Bartlett PEDLAR, MD;  Location: [facility] OR;  Service: Open Heart Surgery;  Laterality: N/A;   XI ROBOTIC ASSISTED INGUINAL HERNIA REPAIR WITH MESH Left 05/09/2021   Procedure: XI ROBOTIC ASSISTED LEFT INGUINAL HERNIA REPAIR WITH MESH;  Surgeon: Rubin Calamity, MD;  Location: [facility] OR;  Service: General;  Laterality: Left;   Family History  Problem Relation Age of Onset   Stroke Mother    Migraines Mother    Hypertension Mother    Hyperlipidemia Mother    Heart failure Mother    Diabetes Mother    Arthritis Mother    Hypertension Father    Hyperlipidemia Father    Heart failure Father    Diabetes Father    Asthma Father    Rheum arthritis Sister    Osteoarthritis Sister    Thyroid  disease Sister    Rheum arthritis Brother    Osteoarthritis Brother    Thyroid  disease Brother    Diabetes Sister    Social History   Occupational History   Not on file  Tobacco Use    Smoking status: Former    Current packs/day: 0.00    Average packs/day: 1.5 packs/day    Types: Cigarettes    Quit date: 2012    Years since quitting: 14.0   Smokeless tobacco: Never  Vaping Use   Vaping status: Former   Quit date: 08/28/2010  Substance and Sexual Activity   Alcohol use: Not Currently    Comment: stopped drinking 02/2020   Drug use: Not Currently    Types: Marijuana    Comment: over 30 yrs ago as of 05/03/21   Sexual activity: Not Currently   Tobacco Counseling Counseling given: Not Answered  SDOH Screenings   Food Insecurity: No Food Insecurity (03/03/2024)  Housing: Unknown (03/03/2024)  Transportation Needs: No Transportation Needs (03/03/2024)  Utilities: Not At Risk (03/03/2024)  Alcohol Screen: Low Risk (03/03/2024)  Depression (PHQ2-9): Low Risk (03/03/2024)  Financial Resource Strain: Low Risk (03/03/2024)  Physical Activity: Insufficiently Active (03/03/2024)  Social Connections: Socially Integrated (03/03/2024)  Stress: No Stress Concern Present (03/03/2024)  Tobacco Use: Medium Risk (03/03/2024)  Health  Literacy: Adequate Health Literacy (03/03/2024)   See flowsheets for full screening details  Depression Screen PHQ 2 & 9 Depression Scale- Over the past 2 weeks, how often have you been bothered by any of the following problems? Little interest or pleasure in doing things: 0 Feeling down, depressed, or hopeless (PHQ Adolescent also includes...irritable): 0 PHQ-2 Total Score: 0 Trouble falling or staying asleep, or sleeping too much: 1 Feeling tired or having little energy: 1 Poor appetite or overeating (PHQ Adolescent also includes...weight loss): 0 Feeling bad about yourself - or that you are a failure or have let yourself or your family down: 0 Trouble concentrating on things, such as reading the newspaper or watching television (PHQ Adolescent also includes...like school work): 0 Moving or speaking so slowly that other people could have noticed. Or the  opposite - being so fidgety or restless that you have been moving around a lot more than usual: 0 Thoughts that you would be better off dead, or of hurting yourself in some way: 0 PHQ-9 Total Score: 2 If you checked off any problems, how difficult have these problems made it for you to do your work, take care of things at home, or get along with other people?: Not difficult at all  Depression Treatment Depression Interventions/Treatment : EYV7-0 Score <4 Follow-up Not Indicated     Goals Addressed             This Visit's Progress    Patient Stated       03/03/2024, wants to feel more better             Objective:    Today's Vitals   03/03/24 0926  BP: 122/70  Pulse: 79  Temp: 98.1 F (36.7 C)  TempSrc: Oral  SpO2: 95%  Weight: 183 lb 6.4 oz (83.2 kg)  Height: 5' 6.5 (1.689 m)   Body mass index is 29.16 kg/m.  Hearing/Vision screen Hearing Screening - Comments:: Denies hearing issues Vision Screening - Comments:: Regular eye exams, [facility] Immunizations and Health Maintenance Health Maintenance  Topic Date Due   Zoster Vaccines- Shingrix (2 of 2) 05/23/2021   FOOT EXAM  04/16/2024   HEMOGLOBIN A1C  06/03/2024   Diabetic kidney evaluation - Urine ACR  07/28/2024   OPHTHALMOLOGY EXAM  09/29/2024   Diabetic kidney evaluation - eGFR measurement  12/03/2024   Medicare Annual Wellness (AWV)  03/03/2025   DTaP/Tdap/Td (2 - Td or Tdap) 05/19/2030   Colonoscopy  08/14/2030   Pneumococcal Vaccine: 50+ Years  Completed   Influenza Vaccine  Completed   Hepatitis B Vaccines 19-59 Average Risk  Completed   Hepatitis C Screening  Completed   HIV Screening  Completed   HPV VACCINES  Aged Out   Meningococcal B Vaccine  Aged Out   COVID-19 Vaccine  Discontinued        Assessment/Plan:  This is a routine wellness examination for [place].  Patient Care Team: Billy Knee, FNP as PCP - General (Internal Medicine) Verlin Lonni BIRCH, MD as PCP - Cardiology  (Cardiology)  I have personally reviewed and noted the following in the patients chart:   Medical and social history Use of alcohol, tobacco or illicit drugs  Current medications and supplements including opioid prescriptions. Functional ability and status Nutritional status Physical activity Advanced directives List of other physicians Hospitalizations, surgeries, and ER visits in previous 12 months Vitals Screenings to include cognitive, depression, and falls Referrals and appointments  No orders of the defined types were placed in  this encounter.  In addition, I have reviewed and discussed with patient certain preventive protocols, quality metrics, and best practice recommendations. A written personalized care plan for preventive services as well as general preventive health recommendations were provided to patient.   Ardella FORBES Dawn, LPN   [id number]/[id number]   Return in 1 year (on 03/03/2025).  After Visit Summary: (In Person-Printed) AVS printed and given to the patient  Nurse Notes: Vaccines not given: Will obtain second shingles at pharmacy  "

## 2024-03-09 ENCOUNTER — Other Ambulatory Visit: Payer: Self-pay

## 2024-03-09 ENCOUNTER — Encounter: Payer: Self-pay | Admitting: Pharmacy Technician

## 2024-03-09 ENCOUNTER — Encounter: Payer: Self-pay | Admitting: Family

## 2024-03-09 ENCOUNTER — Other Ambulatory Visit (HOSPITAL_COMMUNITY): Payer: Self-pay

## 2024-03-10 ENCOUNTER — Other Ambulatory Visit (HOSPITAL_COMMUNITY): Payer: Self-pay

## 2024-03-10 ENCOUNTER — Encounter: Payer: Self-pay | Admitting: Pharmacist

## 2024-03-10 ENCOUNTER — Other Ambulatory Visit: Payer: Self-pay

## 2024-03-11 ENCOUNTER — Encounter: Payer: Self-pay | Admitting: Family

## 2024-03-13 ENCOUNTER — Other Ambulatory Visit: Payer: Self-pay

## 2024-03-19 LAB — GENECONNECT MOLECULAR SCREEN

## 2024-03-23 ENCOUNTER — Telehealth: Payer: Self-pay | Admitting: Medical Genetics

## 2024-04-06 ENCOUNTER — Ambulatory Visit: Admitting: Internal Medicine

## 2025-03-09 ENCOUNTER — Ambulatory Visit
